# Patient Record
Sex: Female | Born: 1935 | Race: White | Hispanic: No | Marital: Married | Smoking: Never smoker
Health system: Southern US, Community
[De-identification: ages and names within clinical notes are randomized; demographics above are authoritative.]

## PROBLEM LIST (undated history)

## (undated) ENCOUNTER — Ambulatory Visit: Source: Home / Self Care

## (undated) DIAGNOSIS — Z5189 Encounter for other specified aftercare: Secondary | ICD-10-CM

## (undated) DIAGNOSIS — K5730 Diverticulosis of large intestine without perforation or abscess without bleeding: Secondary | ICD-10-CM

## (undated) DIAGNOSIS — D735 Infarction of spleen: Secondary | ICD-10-CM

## (undated) DIAGNOSIS — M1990 Unspecified osteoarthritis, unspecified site: Secondary | ICD-10-CM

## (undated) DIAGNOSIS — R51 Headache: Secondary | ICD-10-CM

## (undated) DIAGNOSIS — K219 Gastro-esophageal reflux disease without esophagitis: Secondary | ICD-10-CM

## (undated) DIAGNOSIS — B3781 Candidal esophagitis: Secondary | ICD-10-CM

## (undated) DIAGNOSIS — K5792 Diverticulitis of intestine, part unspecified, without perforation or abscess without bleeding: Secondary | ICD-10-CM

## (undated) DIAGNOSIS — F411 Generalized anxiety disorder: Secondary | ICD-10-CM

## (undated) DIAGNOSIS — N200 Calculus of kidney: Secondary | ICD-10-CM

## (undated) DIAGNOSIS — H02403 Unspecified ptosis of bilateral eyelids: Secondary | ICD-10-CM

## (undated) DIAGNOSIS — N280 Ischemia and infarction of kidney: Secondary | ICD-10-CM

## (undated) DIAGNOSIS — J439 Emphysema, unspecified: Secondary | ICD-10-CM

## (undated) DIAGNOSIS — I499 Cardiac arrhythmia, unspecified: Secondary | ICD-10-CM

## (undated) DIAGNOSIS — A0472 Enterocolitis due to Clostridium difficile, not specified as recurrent: Secondary | ICD-10-CM

## (undated) DIAGNOSIS — F419 Anxiety disorder, unspecified: Secondary | ICD-10-CM

## (undated) DIAGNOSIS — I10 Essential (primary) hypertension: Secondary | ICD-10-CM

## (undated) DIAGNOSIS — N189 Chronic kidney disease, unspecified: Secondary | ICD-10-CM

## (undated) DIAGNOSIS — Z800 Family history of malignant neoplasm of digestive organs: Secondary | ICD-10-CM

## (undated) DIAGNOSIS — M797 Fibromyalgia: Secondary | ICD-10-CM

## (undated) DIAGNOSIS — E785 Hyperlipidemia, unspecified: Secondary | ICD-10-CM

## (undated) DIAGNOSIS — S02402A Zygomatic fracture, unspecified, initial encounter for closed fracture: Secondary | ICD-10-CM

## (undated) HISTORY — DX: Enterocolitis due to Clostridium difficile, not specified as recurrent: A04.72

## (undated) HISTORY — PX: BACK SURGERY: SHX140

## (undated) HISTORY — DX: Generalized anxiety disorder: F41.1

## (undated) HISTORY — DX: Family history of malignant neoplasm of digestive organs: Z80.0

## (undated) HISTORY — PX: FACIAL FRACTURE SURGERY: SHX1570

## (undated) HISTORY — PX: CERVICAL DISC SURGERY: SHX588

## (undated) HISTORY — PX: SHOULDER ARTHROSCOPY W/ ROTATOR CUFF REPAIR: SHX2400

## (undated) HISTORY — PX: BREAST EXCISIONAL BIOPSY: SUR124

## (undated) HISTORY — DX: Zygomatic fracture, unspecified side, initial encounter for closed fracture: S02.402A

## (undated) HISTORY — DX: Emphysema, unspecified: J43.9

## (undated) HISTORY — DX: Candidal esophagitis: B37.81

## (undated) HISTORY — PX: HAND SURGERY: SHX662

## (undated) HISTORY — DX: Diverticulosis of large intestine without perforation or abscess without bleeding: K57.30

## (undated) HISTORY — PX: ABDOMINAL HYSTERECTOMY: SHX81

## (undated) HISTORY — DX: Diverticulitis of intestine, part unspecified, without perforation or abscess without bleeding: K57.92

## (undated) HISTORY — PX: COLONOSCOPY: SHX174

---

## 2000-06-17 ENCOUNTER — Emergency Department (HOSPITAL_COMMUNITY): Admission: EM | Admit: 2000-06-17 | Discharge: 2000-06-17 | Payer: Self-pay | Admitting: Emergency Medicine

## 2000-06-17 ENCOUNTER — Encounter: Payer: Self-pay | Admitting: Emergency Medicine

## 2000-07-14 ENCOUNTER — Ambulatory Visit (HOSPITAL_COMMUNITY): Admission: RE | Admit: 2000-07-14 | Discharge: 2000-07-14 | Payer: Self-pay | Admitting: Orthopaedic Surgery

## 2000-07-24 ENCOUNTER — Encounter: Admission: RE | Admit: 2000-07-24 | Discharge: 2000-08-29 | Payer: Self-pay | Admitting: Orthopaedic Surgery

## 2001-11-06 ENCOUNTER — Emergency Department (HOSPITAL_COMMUNITY): Admission: EM | Admit: 2001-11-06 | Discharge: 2001-11-06 | Payer: Self-pay | Admitting: Emergency Medicine

## 2005-05-15 ENCOUNTER — Ambulatory Visit: Payer: Self-pay

## 2005-06-25 DIAGNOSIS — K5730 Diverticulosis of large intestine without perforation or abscess without bleeding: Secondary | ICD-10-CM

## 2005-06-25 HISTORY — DX: Diverticulosis of large intestine without perforation or abscess without bleeding: K57.30

## 2005-07-04 ENCOUNTER — Ambulatory Visit: Payer: Self-pay | Admitting: Gastroenterology

## 2005-07-16 ENCOUNTER — Ambulatory Visit: Payer: Self-pay | Admitting: Gastroenterology

## 2005-07-25 ENCOUNTER — Emergency Department (HOSPITAL_COMMUNITY): Admission: EM | Admit: 2005-07-25 | Discharge: 2005-07-25 | Payer: Self-pay | Admitting: Emergency Medicine

## 2005-07-31 ENCOUNTER — Encounter: Admission: RE | Admit: 2005-07-31 | Discharge: 2005-07-31 | Payer: Self-pay | Admitting: Internal Medicine

## 2005-12-21 ENCOUNTER — Ambulatory Visit (HOSPITAL_BASED_OUTPATIENT_CLINIC_OR_DEPARTMENT_OTHER): Admission: RE | Admit: 2005-12-21 | Discharge: 2005-12-21 | Payer: Self-pay | Admitting: General Surgery

## 2006-01-29 ENCOUNTER — Encounter: Admission: RE | Admit: 2006-01-29 | Discharge: 2006-01-29 | Payer: Self-pay | Admitting: Internal Medicine

## 2010-12-12 ENCOUNTER — Other Ambulatory Visit: Payer: Self-pay | Admitting: Orthopedic Surgery

## 2010-12-12 DIAGNOSIS — M25561 Pain in right knee: Secondary | ICD-10-CM

## 2010-12-18 ENCOUNTER — Ambulatory Visit
Admission: RE | Admit: 2010-12-18 | Discharge: 2010-12-18 | Disposition: A | Discharge status: Home / Self Care | Payer: Medicare Other | Source: Ambulatory Visit | Attending: Orthopedic Surgery | Admitting: Orthopedic Surgery

## 2010-12-18 DIAGNOSIS — M25561 Pain in right knee: Secondary | ICD-10-CM

## 2011-06-26 DIAGNOSIS — B3781 Candidal esophagitis: Secondary | ICD-10-CM

## 2011-06-26 HISTORY — DX: Candidal esophagitis: B37.81

## 2011-07-04 ENCOUNTER — Emergency Department (HOSPITAL_COMMUNITY): Payer: Medicare Other

## 2011-07-04 ENCOUNTER — Encounter (HOSPITAL_COMMUNITY): Payer: Self-pay | Admitting: Emergency Medicine

## 2011-07-04 ENCOUNTER — Emergency Department (HOSPITAL_COMMUNITY)
Admission: EM | Admit: 2011-07-04 | Discharge: 2011-07-05 | Disposition: A | Discharge status: Home / Self Care | Payer: Medicare Other | Attending: Emergency Medicine | Admitting: Emergency Medicine

## 2011-07-04 DIAGNOSIS — S0230XA Fracture of orbital floor, unspecified side, initial encounter for closed fracture: Secondary | ICD-10-CM

## 2011-07-04 DIAGNOSIS — M545 Low back pain, unspecified: Secondary | ICD-10-CM | POA: Insufficient documentation

## 2011-07-04 DIAGNOSIS — IMO0002 Reserved for concepts with insufficient information to code with codable children: Secondary | ICD-10-CM

## 2011-07-04 DIAGNOSIS — S61209A Unspecified open wound of unspecified finger without damage to nail, initial encounter: Secondary | ICD-10-CM | POA: Insufficient documentation

## 2011-07-04 DIAGNOSIS — R51 Headache: Secondary | ICD-10-CM | POA: Insufficient documentation

## 2011-07-04 DIAGNOSIS — S0003XA Contusion of scalp, initial encounter: Secondary | ICD-10-CM | POA: Insufficient documentation

## 2011-07-04 DIAGNOSIS — M79609 Pain in unspecified limb: Secondary | ICD-10-CM | POA: Insufficient documentation

## 2011-07-04 DIAGNOSIS — W06XXXA Fall from bed, initial encounter: Secondary | ICD-10-CM | POA: Insufficient documentation

## 2011-07-04 DIAGNOSIS — E785 Hyperlipidemia, unspecified: Secondary | ICD-10-CM | POA: Insufficient documentation

## 2011-07-04 DIAGNOSIS — I10 Essential (primary) hypertension: Secondary | ICD-10-CM | POA: Insufficient documentation

## 2011-07-04 DIAGNOSIS — M25569 Pain in unspecified knee: Secondary | ICD-10-CM | POA: Insufficient documentation

## 2011-07-04 DIAGNOSIS — Z79899 Other long term (current) drug therapy: Secondary | ICD-10-CM | POA: Insufficient documentation

## 2011-07-04 HISTORY — DX: Anxiety disorder, unspecified: F41.9

## 2011-07-04 HISTORY — DX: Hyperlipidemia, unspecified: E78.5

## 2011-07-04 HISTORY — DX: Essential (primary) hypertension: I10

## 2011-07-04 HISTORY — DX: Fibromyalgia: M79.7

## 2011-07-04 MED ORDER — OXYCODONE-ACETAMINOPHEN 5-325 MG PO TABS
1.0000 | ORAL_TABLET | Freq: Once | ORAL | Status: AC
  Administered 2011-07-04: 1 via ORAL
  Filled 2011-07-04: qty 1

## 2011-07-04 MED ORDER — ONDANSETRON 4 MG PO TBDP
8.0000 mg | ORAL_TABLET | Freq: Once | ORAL | Status: AC
  Administered 2011-07-04: 8 mg via ORAL
  Filled 2011-07-04: qty 2

## 2011-07-04 MED ORDER — TETANUS-DIPHTH-ACELL PERTUSSIS 5-2.5-18.5 LF-MCG/0.5 IM SUSP
0.5000 mL | Freq: Once | INTRAMUSCULAR | Status: DC
  Filled 2011-07-04: qty 0.5

## 2011-07-04 NOTE — ED Notes (Signed)
Presents s/p fall  Right eye with brusing  Laceration to right index finger bleeding controlled and abrasion to right knee.  ALso c/o lumbar back pain .

## 2011-07-04 NOTE — ED Notes (Signed)
Pt st's she was standing on the bed trying to change a light bulb and she lost her balance and fell hitting the bedside table.  Pt has swelling and bruising to right eye, lac to right index finger and pain to right knee.  Ice pack applied to head.  Pt denies LOC.

## 2011-07-04 NOTE — ED Provider Notes (Addendum)
History     CSN: [id number]  Arrival date & time 07/04/11  4098   First MD Initiated Contact with Patient 07/04/11 2138      Chief Complaint  Patient presents with  . Fall     Patient is a 78 y.o. female presenting with fall. The history is provided by the patient.  Fall The accident occurred 3 to 5 hours ago. Incident: while changing a light bulb. The pain is present in the head. The pain is moderate. She was ambulatory at the scene. Associated symptoms include headaches. Pertinent negatives include no visual change and no loss of consciousness. Exacerbated by: palpation.  Pt reports she was standing on a bed, changing a light bulb when she lost balance and fell hitting table with her head/face No epistaxis No visual change No cp/sob No neck pain Reports increased low back pain from her baseline No focal weakness She has pain in right index finger and her right knee   Past Medical History  Diagnosis Date  . Hypertension   . Hyperlipemia   . Fibromyalgia   . Anxiety     Past Surgical History  Procedure Date  . Abdominal hysterectomy   . Hand surgery   . Back surgery     No family history on file.  History  Substance Use Topics  . Smoking status: Never Smoker   . Smokeless tobacco: Not on file  . Alcohol Use: No    OB History    Grav Para Term Preterm Abortions TAB SAB Ect Mult Living                  Review of Systems  Neurological: Positive for headaches. Negative for loss of consciousness.  All other systems reviewed and are negative.    Allergies  Aspirin; Codeine; and Latex  Home Medications   Current Outpatient Rx  Name Route Sig Dispense Refill  . ALPRAZOLAM 1 MG PO TABS Oral Take 0.5-1 mg by mouth 3 (three) times daily as needed. For anxiety    . ATENOLOL 100 MG PO TABS Oral Take 100 mg by mouth every evening.    Marland Kitchen LOSARTAN POTASSIUM 100 MG PO TABS Oral Take 100 mg by mouth every evening.    Marland Kitchen OMEPRAZOLE MAGNESIUM 20 MG PO TBEC Oral Take  20 mg by mouth daily.    Marland Kitchen PRAVASTATIN SODIUM 40 MG PO TABS Oral Take 40 mg by mouth daily.      BP 133/70  Pulse 71  Temp(Src) 98.4 F (36.9 C) (Oral)  Resp 20  SpO2 97%  Physical Exam CONSTITUTIONAL: Well developed/well nourished HEAD AND FACE: right periorbital hematoma EYES: PERRL, no obvious foreign body to OD , appears to have some difficulty with downward gaze, no proptosis ENMT: Mucous membranes moist, no nasal deformity, no septal hematoma, no malocclusion, no trismus, no dental fx noted.  Bruising noted inside on buccal mucosa but no large laceration NECK: supple no meningeal signs SPINE:C-spine nontender, NEXUS criteria met, no thoracic tenderness, +lumbar tenderness CV: S1/S2 noted, no murmurs/rubs/gallops noted LUNGS: Lungs are clear to auscultation bilaterally, no apparent distress ABDOMEN: soft, nontender, no rebound or guarding GU:no cva tenderness NEURO: Pt is awake/alert, moves all extremitiesx4, GCS 15 EXTREMITIES: pulses normal, full ROM.  Tenderness to palpation of right patella.  Laceration noted to lateral/distal aspect of right index finger, no bone/tendon exposed, bleeding controlled.  Tenderness noted.  All other extremities/joints palpated/ranged and nontender No snuffbox tenderness noted to either wrist No tenderness with ROM of  either hip SKIN: warm, color normal PSYCH: no abnormalities of mood noted  ED Course  Procedures   LACERATION REPAIR Performed by: Joya Gaskins Consent: Verbal consent obtained. Risks and benefits: risks, benefits and alternatives were discussed Patient identity confirmed: provided demographic data Time out performed prior to procedure Prepped and Draped in normal sterile fashion Wound explored  Laceration Location: right index finger  Laceration Length: 1 cm  No Foreign Bodies seen or palpated  Anesthesia: none  Irrigation method: tap water - standard amount  Skin closure: simple   Technique: tissue  adhesive  Patient tolerance: Patient tolerated the procedure well with no immediate complications.    Labs Reviewed - No data to display Dg Lumbar Spine Complete  07/04/2011  *RADIOLOGY REPORT*  Clinical Data: Fall.  Back pain.  History of lumbar spine surgery.  LUMBAR SPINE - COMPLETE 4+ VIEW  Comparison: None.  Findings: Unchanged alignment lumbar spine.  Spinous process cerclage wires are present extending from L3-S1.  Ankylosis of L4- L5.  Vertebral body height is preserved.  Adjacent segment degenerative disease.  Posterior fusion is present.  Osteopenia.  IMPRESSION: No acute osseous abnormality.  Postoperative changes of lumbar spinal fusion extending from L3-S1.  Original Report Authenticated By: Andreas Newport, M.D.   Dg Knee Complete 4 Views Right  07/04/2011  *RADIOLOGY REPORT*  Clinical Data: Fall.  Head injury.  Knee pain.  RIGHT KNEE - COMPLETE 4+ VIEW  Comparison: None.  Findings: Anatomic alignment of the knee.  There is no displaced fracture identified.  There is some cortical irregularity of the superior pole of the patella however this would be an unusual location for fracture and there is minimal prepatellar soft tissue swelling which is typically associated with patellar fracture. This may represent enthesopathy associated with the quadriceps insertion.  Clinically correlate for tenderness in this region. There is no knee effusion.  Medial and lateral joint spaces are preserved.  IMPRESSION: No definite acute osseous abnormality.  Cortical irregularity of the superior patellar pole probably represents enthesopathy. Clinically correlate for tenderness.  Original Report Authenticated By: Andreas Newport, M.D.   Dg Finger Index Right  07/04/2011  *RADIOLOGY REPORT*  Clinical Data: Fall.  Right index finger pain.  RIGHT INDEX FINGER 2+V  Comparison: None.  Findings: Right DIP joint severe osteoarthritis.  Osteoarthritis of the PIP joint of the index finger is also present.  The diffusely  osteoarthritis of the hand.  The soft tissue irregularity is present over the index finger and there is ulnar deviation of the index finger.  No displaced fracture is identified.  IMPRESSION: No displaced fracture.  Osteoarthritis and ulnar deviation of the right index finger.  Original Report Authenticated By: Andreas Newport, M.D.   12:02 AM No double vision noted but appears to have some difficulty with downward gaze on right No hyphema, pupils equal/reactive Will call into ENT for facial injury Pt is ambulatory   12:06 AM D/w dr Lazarus Salines, discussed limiation in downward gaze and also ct findings Will see early next week Pain control, abx, analgesia Needs eye followup as well   Pt does not have her glasses Vision in both eyes ranged from 20/100-20/200 MDM  Nursing notes reviewed and considered in documentation xrays reviewed and considered         Joya Gaskins, MD 07/05/11 5621  Joya Gaskins, MD 07/05/11 [phone]

## 2011-07-05 MED ORDER — OXYCODONE-ACETAMINOPHEN 5-325 MG PO TABS: 1.0000 | ORAL_TABLET | ORAL | Status: DC | PRN

## 2011-07-05 MED ORDER — CEPHALEXIN 500 MG PO CAPS: 500.0000 mg | ORAL_CAPSULE | Freq: Four times a day (QID) | ORAL | Status: DC

## 2011-07-10 ENCOUNTER — Encounter (HOSPITAL_COMMUNITY): Payer: Self-pay | Admitting: Respiratory Therapy

## 2011-07-11 ENCOUNTER — Encounter (HOSPITAL_COMMUNITY): Payer: Self-pay

## 2011-07-11 ENCOUNTER — Ambulatory Visit (HOSPITAL_COMMUNITY)
Admission: RE | Admit: 2011-07-11 | Discharge: 2011-07-11 | Disposition: A | Discharge status: Home / Self Care | Payer: Medicare Other | Source: Ambulatory Visit | Attending: Anesthesiology | Admitting: Anesthesiology

## 2011-07-11 ENCOUNTER — Encounter (HOSPITAL_COMMUNITY)
Admission: RE | Admit: 2011-07-11 | Discharge: 2011-07-11 | Disposition: A | Discharge status: Home / Self Care | Payer: Medicare Other | Source: Ambulatory Visit | Attending: Otolaryngology | Admitting: Otolaryngology

## 2011-07-11 DIAGNOSIS — I10 Essential (primary) hypertension: Secondary | ICD-10-CM | POA: Insufficient documentation

## 2011-07-11 DIAGNOSIS — I517 Cardiomegaly: Secondary | ICD-10-CM | POA: Insufficient documentation

## 2011-07-11 DIAGNOSIS — Z01812 Encounter for preprocedural laboratory examination: Secondary | ICD-10-CM | POA: Insufficient documentation

## 2011-07-11 DIAGNOSIS — Z01818 Encounter for other preprocedural examination: Secondary | ICD-10-CM | POA: Insufficient documentation

## 2011-07-11 HISTORY — DX: Cardiac arrhythmia, unspecified: I49.9

## 2011-07-11 HISTORY — DX: Chronic kidney disease, unspecified: N18.9

## 2011-07-11 HISTORY — DX: Unspecified osteoarthritis, unspecified site: M19.90

## 2011-07-11 HISTORY — DX: Encounter for other specified aftercare: Z51.89

## 2011-07-11 HISTORY — DX: Headache: R51

## 2011-07-11 HISTORY — DX: Gastro-esophageal reflux disease without esophagitis: K21.9

## 2011-07-11 LAB — BASIC METABOLIC PANEL
BUN: 15 mg/dL (ref 6–23)
CO2: 32 mEq/L (ref 19–32)
Calcium: 9.4 mg/dL (ref 8.4–10.5)
Creatinine, Ser: 0.71 mg/dL (ref 0.50–1.10)
Glucose, Bld: 98 mg/dL (ref 70–99)

## 2011-07-11 LAB — CBC
MCH: 30.4 pg (ref 26.0–34.0)
MCHC: 34.1 g/dL (ref 30.0–36.0)
MCV: 89.1 fL (ref 78.0–100.0)
Platelets: 253 10*3/uL (ref 150–400)
RDW: 12.5 % (ref 11.5–15.5)

## 2011-07-11 NOTE — Progress Notes (Signed)
EKG REQ BY TARRA FROM DR [facility]

## 2011-07-11 NOTE — Pre-Procedure Instructions (Addendum)
20 Debra White  07/11/2011   Your procedure is scheduled on:  07/13/11  Report to [facility] at 630 AM.  Call this number if you have problems the morning of surgery: [phone]   Remember:   Do not eat food:After Midnight.  May have clear liquids: up to 4 Hours before arrival.  Clear liquids include soda, tea, black coffee, apple or grape juice, broth.  Take these medicines the morning of surgery with A SIP OF WATER:XANAX, ATENOLOL,KEFLEX, HYCOSAMINE, PRILOSEC,  TRAMADOL               Stop FLAXSEED, MULTI VIT,  FISH OIL NOW   Do not wear jewelry, make-up or nail polish.  Do not wear lotions, powders, or perfumes. You may wear deodorant.  Do not shave 48 hours prior to surgery.  Do not bring valuables to the hospital.  Contacts, dentures or bridgework may not be worn into surgery.  Leave suitcase in the car. After surgery it may be brought to your room.  For patients admitted to the hospital, checkout time is 11:00 AM the day of discharge.   Patients discharged the day of surgery will not be allowed to drive home.  Name and phone number of your driver:   Special Instructions: CHG Shower Use Special Wash: 1/2 bottle night before surgery and 1/2 bottle morning of surgery.   Please read over the following fact sheets that you were given: Pain Booklet, MRSA Information and Surgical Site Infection Prevention

## 2011-07-12 ENCOUNTER — Other Ambulatory Visit: Payer: Self-pay | Admitting: Otolaryngology

## 2011-07-12 NOTE — H&P (Signed)
White,  Debra 77 y.o., female [id number]     Chief Complaint: Depressed RIGHT zygomatic fracture, including orbital floor fracture  HPI: 77-year-old white female was standing full upright on the bed changing a light bulb, and fell off, striking her face against a bedside table.  She did not lose consciousness.  She was seen in the emergency room where a CT scan demonstrated a depressed fracture of the right eye, with disruption of the orbital floor.  I did review these films.  Subsequently, she developed additional aches and pains including her RIGHT shoulder and RIGHT knee.  She has some pre-existing arthralgias and may even carry the diagnosis of fibromyalgia.  From the emergency room, she was given Tylenol for pain, Keflex antibiosis, had ice and elevation recommended, no nose blowing, and last week saw Dr. Bevis, ophthalmologist.  She is here for further evaluation of the injury.  She feels like the vision in the right eye is slightly blurry.  Occlusion feels okay, but the jaw is painful with range of motion.  Mild discomfort in the right eye with range of motion.  No obvious diplopia.  She does complain of numbness in the RIGHT mid face including nose and upper lip.  PMH: Past Medical History  Diagnosis Date  . Hypertension   . Hyperlipemia   . Fibromyalgia   . Anxiety   . Dysrhythmia     RX  . Blood transfusion   . Chronic kidney disease     STONES  . GERD (gastroesophageal reflux disease)   . Headache   . Arthritis     Surg Hx: Past Surgical History  Procedure Date  . Abdominal hysterectomy   . Hand surgery     BIL   . Shoulder arthroscopy w/ rotator cuff repair     LFT  . Cervical disc surgery   . Back surgery     X2    FHx:  No family history on file. SocHx:  reports that she has never smoked. She does not have any smokeless tobacco history on file. She reports that she does not drink alcohol or use illicit drugs.  ALLERGIES:  Allergies  Allergen Reactions  .  Latex Hives    TONGUE HAD BLISTERS WHEN HAD DENTAL SURGERY  . Aspirin     Gi upset  . Codeine Itching    Medications Prior to Admission  Medication Sig Dispense Refill  . ALPRAZolam (XANAX) 1 MG tablet Take 0.5-1 mg by mouth 3 (three) times daily as needed. For anxiety      . atenolol (TENORMIN) 100 MG tablet Take 100 mg by mouth every morning.       . Calcium Carbonate-Vitamin D (CALCIUM 600 + D PO) Take 1 tablet by mouth daily.      . cephALEXin (KEFLEX) 500 MG capsule Take 500 mg by mouth 4 (four) times daily.      . cholecalciferol (VITAMIN D) 1000 UNITS tablet Take 2,000 Units by mouth daily.      . fish oil-omega-3 fatty acids 1000 MG capsule Take 1 g by mouth 3 (three) times daily.      . Flaxseed, Linseed, (FLAX SEED OIL) 1300 MG CAPS Take 1 capsule by mouth 2 (two) times daily.      . hyoscyamine (LEVSIN SL) 0.125 MG SL tablet Place 0.125 mg under the tongue every 4 (four) hours as needed.      . losartan (COZAAR) 100 MG tablet Take 100 mg by mouth every evening.      .   Magnesium 250 MG TABS Take 250 mg by mouth 3 (three) times daily.      . Multiple Vitamins-Minerals (MULTIVITAMINS THER. W/MINERALS) TABS Take 1 tablet by mouth daily.      . omeprazole (PRILOSEC OTC) 20 MG tablet Take 20 mg by mouth daily.      . OVER THE COUNTER MEDICATION Take 1 capsule by mouth daily. Pt. Takes a over the counter stool softner      . oxyCODONE-acetaminophen (PERCOCET) 5-325 MG per tablet Take 1 tablet by mouth every 4 (four) hours as needed. For pain      . pravastatin (PRAVACHOL) 40 MG tablet Take 40 mg by mouth daily.      . Probiotic Product (PHILLIPS COLON HEALTH PO) Take 1 tablet by mouth daily.      . traMADol (ULTRAM) 50 MG tablet Take 50 mg by mouth every 6 (six) hours as needed. For pain       No current facility-administered medications on file as of 07/12/2011.    Results for orders placed during the hospital encounter of 07/11/11 (from the past 48 hour(s))  SURGICAL PCR SCREEN      Status: Normal   Collection Time   07/11/11  2:31 PM      Component Value Range Comment   MRSA, PCR NEGATIVE  NEGATIVE     Staphylococcus aureus NEGATIVE  NEGATIVE    BASIC METABOLIC PANEL     Status: Abnormal   Collection Time   07/11/11  3:00 PM      Component Value Range Comment   Sodium 132 (*) 135 - 145 (mEq/L)    Potassium 4.0  3.5 - 5.1 (mEq/L)    Chloride 92 (*) 96 - 112 (mEq/L)    CO2 32  19 - 32 (mEq/L)    Glucose, Bld 98  70 - 99 (mg/dL)    BUN 15  6 - 23 (mg/dL)    Creatinine, Ser 0.71  0.50 - 1.10 (mg/dL)    Calcium 9.4  8.4 - 10.5 (mg/dL)    GFR calc non Af Amer 82 (*) >90 (mL/min)    GFR calc Af Amer >90  >90 (mL/min)   CBC     Status: Abnormal   Collection Time   07/11/11  3:00 PM      Component Value Range Comment   WBC 11.6 (*) 4.0 - 10.5 (K/uL)    RBC 3.95  3.87 - 5.11 (MIL/uL)    Hemoglobin 12.0  12.0 - 15.0 (g/dL)    HCT 35.2 (*) 36.0 - 46.0 (%)    MCV 89.1  78.0 - 100.0 (fL)    MCH 30.4  26.0 - 34.0 (pg)    MCHC 34.1  30.0 - 36.0 (g/dL)    RDW 12.5  11.5 - 15.5 (%)    Platelets 253  150 - 400 (K/uL)    Dg Chest 2 View  07/11/2011  *RADIOLOGY REPORT*  Clinical Data: Preoperative evaluation.  History of sternal fracture.  History of hypertension.  History of acute bronchitis.  CHEST - 2 VIEW  Comparison: None.  Findings: There is slight enlargement cardiac silhouette.  No pulmonary edema, pneumonia, or pleural effusion is seen.  Slight tortuosity of descending thoracic aorta is seen.  History given of sternal fracture.  Detail of sternum on lateral image using chest technique is not adequate to evaluate the sternum.  There is osteopenic appearance of bones.  IMPRESSION: Slight enlargement cardiac silhouette.  No pulmonary edema, pneumonia, or pleural effusion.  Original   Report Authenticated By: DAVID CALL, M.D.    ROS:non contrib except as in HPI.  There were no vitals taken for this visit.  PHYSICAL EXAM:  She is thin and basically healthy although appears  frustrated and tired.  She has flattening of the RIGHT malar eminence.  She has periorbital ecchymosis on the RIGHT side.  She has some halitosis consistent with old blood.  Mental status seems appropriate. She wears hearing aids on both sides and is somewhat hard of hearing in conversational speech without them.  As best I could tell, she has full range of motion with conjugate gaze.  She describes subjective blurriness on the RIGHT.  Ear canals are clear with normal aerated drums.  Internal nose is clear.  Oral cavity is moist with a partial upper and lower plates.  No true trismus or malocclusion.  Oropharynx clear.  Neck unremarkable.   Palpation of the bony facial skeleton reveals depression of the zygomatic arch and malar eminence, and a 6 mm step off of the RIGHT infraorbital rim.   Studies Reviewed:Maxillofacial CT scan with 3D reconstructions.    Assessment/Plan Displaced/depressed RIGHT zygoma fracture with some disruption of the orbital floor.  She is having numbness consistent with an infraorbital nerve injury.  She has pain with range of motion of the jaw consistent with impingement of the zygomatic arch on the temporalis muscle.  Some blurred vision on the RIGHT for which she is under the care of Dr. Bevis, ophthalmologist.  In fact, she is seeing him again tomorrow.  I recommend ORIF RIGHT zygoma with orbital floor exploration and with a Frost stitch temporary tarsorrhaphy.  I would like to get around to doing this later this week or early next week.  I spoke with Dr. McKeown, her family physician,  who felt like she should be okay for general anesthesia.   I discussed the surgery in detail including risks and complications.  Questions were answered and informed consent was obtained.  We will do this at [facility] main hospital and observe her 23 hours extended recovery.  Postoperative prescriptions for Vicodin and Keflex rested and given.  I will see her 10 days after surgery here in the office  for suture removal.  I discussed advancement of diet and activity, and further proscription against nose blowing for 2 weeks after the surgery.  Lundon Verdejo T 07/12/2011, 12:19 PM      

## 2011-07-13 ENCOUNTER — Ambulatory Visit (HOSPITAL_COMMUNITY)
Admission: RE | Admit: 2011-07-13 | Discharge: 2011-07-14 | Disposition: A | Discharge status: Home / Self Care | Payer: Medicare Other | Source: Ambulatory Visit | Attending: Otolaryngology | Admitting: Otolaryngology

## 2011-07-13 ENCOUNTER — Encounter (HOSPITAL_COMMUNITY): Payer: Self-pay | Admitting: *Deleted

## 2011-07-13 ENCOUNTER — Encounter (HOSPITAL_COMMUNITY)
Admission: RE | Disposition: A | Discharge status: Home / Self Care | Payer: Self-pay | Source: Ambulatory Visit | Attending: Otolaryngology

## 2011-07-13 ENCOUNTER — Ambulatory Visit (HOSPITAL_COMMUNITY): Payer: Medicare Other | Admitting: *Deleted

## 2011-07-13 DIAGNOSIS — M129 Arthropathy, unspecified: Secondary | ICD-10-CM | POA: Insufficient documentation

## 2011-07-13 DIAGNOSIS — F411 Generalized anxiety disorder: Secondary | ICD-10-CM | POA: Insufficient documentation

## 2011-07-13 DIAGNOSIS — S02400A Malar fracture unspecified, initial encounter for closed fracture: Secondary | ICD-10-CM | POA: Insufficient documentation

## 2011-07-13 DIAGNOSIS — E785 Hyperlipidemia, unspecified: Secondary | ICD-10-CM | POA: Insufficient documentation

## 2011-07-13 DIAGNOSIS — M25519 Pain in unspecified shoulder: Secondary | ICD-10-CM | POA: Insufficient documentation

## 2011-07-13 DIAGNOSIS — M25569 Pain in unspecified knee: Secondary | ICD-10-CM | POA: Insufficient documentation

## 2011-07-13 DIAGNOSIS — S02402A Zygomatic fracture, unspecified, initial encounter for closed fracture: Secondary | ICD-10-CM

## 2011-07-13 DIAGNOSIS — R51 Headache: Secondary | ICD-10-CM | POA: Insufficient documentation

## 2011-07-13 DIAGNOSIS — Y998 Other external cause status: Secondary | ICD-10-CM | POA: Insufficient documentation

## 2011-07-13 DIAGNOSIS — Y93E9 Activity, other interior property and clothing maintenance: Secondary | ICD-10-CM | POA: Insufficient documentation

## 2011-07-13 DIAGNOSIS — S0230XA Fracture of orbital floor, unspecified side, initial encounter for closed fracture: Secondary | ICD-10-CM | POA: Insufficient documentation

## 2011-07-13 DIAGNOSIS — F430 Acute stress reaction: Secondary | ICD-10-CM | POA: Insufficient documentation

## 2011-07-13 DIAGNOSIS — K219 Gastro-esophageal reflux disease without esophagitis: Secondary | ICD-10-CM | POA: Insufficient documentation

## 2011-07-13 DIAGNOSIS — I499 Cardiac arrhythmia, unspecified: Secondary | ICD-10-CM | POA: Insufficient documentation

## 2011-07-13 DIAGNOSIS — W06XXXA Fall from bed, initial encounter: Secondary | ICD-10-CM | POA: Insufficient documentation

## 2011-07-13 DIAGNOSIS — S02401A Maxillary fracture, unspecified, initial encounter for closed fracture: Secondary | ICD-10-CM | POA: Insufficient documentation

## 2011-07-13 DIAGNOSIS — I10 Essential (primary) hypertension: Secondary | ICD-10-CM | POA: Insufficient documentation

## 2011-07-13 DIAGNOSIS — IMO0001 Reserved for inherently not codable concepts without codable children: Secondary | ICD-10-CM | POA: Insufficient documentation

## 2011-07-13 DIAGNOSIS — Y92009 Unspecified place in unspecified non-institutional (private) residence as the place of occurrence of the external cause: Secondary | ICD-10-CM | POA: Insufficient documentation

## 2011-07-13 HISTORY — PX: ORIF TRIPOD FRACTURE: SHX5211

## 2011-07-13 HISTORY — DX: Zygomatic fracture, unspecified side, initial encounter for closed fracture: S02.402A

## 2011-07-13 SURGERY — OPEN REDUCTION INTERNAL FIXATION (ORIF) TRIPOD FRACTURE
Anesthesia: General | Site: Face | Laterality: Right | Wound class: Clean Contaminated

## 2011-07-13 MED ORDER — CEFAZOLIN SODIUM 1-5 GM-% IV SOLN
1.0000 g | INTRAVENOUS | Status: AC
  Administered 2011-07-13: 1 g via INTRAVENOUS
  Filled 2011-07-13: qty 50

## 2011-07-13 MED ORDER — ONDANSETRON HCL 4 MG PO TABS: 4.0000 mg | ORAL_TABLET | ORAL | Status: DC | PRN

## 2011-07-13 MED ORDER — ATENOLOL 100 MG PO TABS
100.0000 mg | ORAL_TABLET | ORAL | Status: DC
  Administered 2011-07-14: 100 mg via ORAL
  Filled 2011-07-13 (×3): qty 1

## 2011-07-13 MED ORDER — CEPHALEXIN 250 MG PO CAPS: 250.0000 mg | ORAL_CAPSULE | Freq: Four times a day (QID) | ORAL | Status: DC

## 2011-07-13 MED ORDER — CEPHALEXIN 500 MG PO CAPS
500.0000 mg | ORAL_CAPSULE | Freq: Four times a day (QID) | ORAL | Status: DC
  Administered 2011-07-13: 500 mg via ORAL
  Filled 2011-07-13 (×3): qty 1

## 2011-07-13 MED ORDER — ALPRAZOLAM 0.5 MG PO TABS
0.5000 mg | ORAL_TABLET | Freq: Three times a day (TID) | ORAL | Status: DC | PRN
  Administered 2011-07-13: 0.5 mg via ORAL
  Administered 2011-07-14: 1 mg via ORAL
  Filled 2011-07-13: qty 1
  Filled 2011-07-13: qty 2

## 2011-07-13 MED ORDER — ONDANSETRON HCL 4 MG/2ML IJ SOLN
INTRAMUSCULAR | Status: DC | PRN
  Administered 2011-07-13: 4 mg via INTRAVENOUS

## 2011-07-13 MED ORDER — CEFAZOLIN SODIUM 1-5 GM-% IV SOLN
INTRAVENOUS | Status: AC
  Filled 2011-07-13: qty 50

## 2011-07-13 MED ORDER — DROPERIDOL 2.5 MG/ML IJ SOLN
0.6250 mg | INTRAMUSCULAR | Status: DC | PRN
  Filled 2011-07-13: qty 2

## 2011-07-13 MED ORDER — ALUM & MAG HYDROXIDE-SIMETH 200-200-20 MG/5ML PO SUSP
30.0000 mL | Freq: Four times a day (QID) | ORAL | Status: DC | PRN
  Administered 2011-07-14: 30 mL via ORAL
  Filled 2011-07-13 (×2): qty 30

## 2011-07-13 MED ORDER — EPHEDRINE SULFATE 50 MG/ML IJ SOLN
INTRAMUSCULAR | Status: DC | PRN
  Administered 2011-07-13: 10 mg via INTRAVENOUS
  Administered 2011-07-13: 5 mg via INTRAVENOUS
  Administered 2011-07-13: 10 mg via INTRAVENOUS
  Administered 2011-07-13 (×2): 5 mg via INTRAVENOUS

## 2011-07-13 MED ORDER — HYOSCYAMINE SULFATE 0.125 MG SL SUBL
0.1250 mg | SUBLINGUAL_TABLET | SUBLINGUAL | Status: DC | PRN
  Filled 2011-07-13: qty 1

## 2011-07-13 MED ORDER — HYDROMORPHONE HCL PF 1 MG/ML IJ SOLN
INTRAMUSCULAR | Status: AC
  Filled 2011-07-13: qty 1

## 2011-07-13 MED ORDER — HYDROMORPHONE HCL PF 1 MG/ML IJ SOLN
0.2500 mg | INTRAMUSCULAR | Status: DC | PRN
  Administered 2011-07-13 (×4): 0.25 mg via INTRAVENOUS

## 2011-07-13 MED ORDER — GELATIN ADSORBABLE OP FILM
ORAL_FILM | OPHTHALMIC | Status: DC | PRN
  Administered 2011-07-13: 1

## 2011-07-13 MED ORDER — CEPHALEXIN 500 MG PO CAPS
500.0000 mg | ORAL_CAPSULE | Freq: Four times a day (QID) | ORAL | Status: DC
  Administered 2011-07-13 – 2011-07-14 (×2): 500 mg via ORAL
  Filled 2011-07-13 (×5): qty 1

## 2011-07-13 MED ORDER — OXYCODONE-ACETAMINOPHEN 5-325 MG PO TABS
1.0000 | ORAL_TABLET | ORAL | Status: DC | PRN
  Administered 2011-07-14: 1 via ORAL
  Filled 2011-07-13 (×2): qty 1

## 2011-07-13 MED ORDER — SODIUM CHLORIDE 0.9 % IV SOLN
INTRAVENOUS | Status: DC
  Administered 2011-07-13: 100 mL/h via INTRAVENOUS
  Administered 2011-07-13: 23:00:00 via INTRAVENOUS

## 2011-07-13 MED ORDER — NEOMYCIN-BACITRACIN ZN-POLYMYX 5-400-10000 OP OINT
TOPICAL_OINTMENT | OPHTHALMIC | Status: DC | PRN
  Administered 2011-07-13: 1

## 2011-07-13 MED ORDER — MIDAZOLAM HCL 5 MG/5ML IJ SOLN
INTRAMUSCULAR | Status: DC | PRN
  Administered 2011-07-13: 1 mg via INTRAVENOUS

## 2011-07-13 MED ORDER — NEOSTIGMINE METHYLSULFATE 1 MG/ML IJ SOLN
INTRAMUSCULAR | Status: DC | PRN
  Administered 2011-07-13: 3 mg via INTRAVENOUS

## 2011-07-13 MED ORDER — GLYCOPYRROLATE 0.2 MG/ML IJ SOLN
INTRAMUSCULAR | Status: DC | PRN
  Administered 2011-07-13: .5 mg via INTRAVENOUS

## 2011-07-13 MED ORDER — LACTATED RINGERS IV SOLN
INTRAVENOUS | Status: DC | PRN
  Administered 2011-07-13 (×2): via INTRAVENOUS

## 2011-07-13 MED ORDER — OMEPRAZOLE MAGNESIUM 20 MG PO TBEC: 20.0000 mg | DELAYED_RELEASE_TABLET | Freq: Every day | ORAL | Status: DC

## 2011-07-13 MED ORDER — FENTANYL CITRATE 0.05 MG/ML IJ SOLN
INTRAMUSCULAR | Status: DC | PRN
  Administered 2011-07-13 (×3): 50 ug via INTRAVENOUS
  Administered 2011-07-13: 25 ug via INTRAVENOUS
  Administered 2011-07-13: 100 ug via INTRAVENOUS
  Administered 2011-07-13 (×2): 25 ug via INTRAVENOUS
  Administered 2011-07-13 (×2): 50 ug via INTRAVENOUS

## 2011-07-13 MED ORDER — BSS IO SOLN
INTRAOCULAR | Status: DC | PRN
  Administered 2011-07-13: 15 mL via INTRAOCULAR

## 2011-07-13 MED ORDER — OXYCODONE-ACETAMINOPHEN 5-325 MG PO TABS: 1.0000 | ORAL_TABLET | ORAL | Status: DC | PRN

## 2011-07-13 MED ORDER — ONDANSETRON HCL 4 MG/2ML IJ SOLN: 4.0000 mg | INTRAMUSCULAR | Status: DC | PRN

## 2011-07-13 MED ORDER — LOSARTAN POTASSIUM 50 MG PO TABS
100.0000 mg | ORAL_TABLET | Freq: Every evening | ORAL | Status: DC
  Administered 2011-07-13: 100 mg via ORAL
  Filled 2011-07-13 (×2): qty 2

## 2011-07-13 MED ORDER — PANTOPRAZOLE SODIUM 40 MG PO TBEC
40.0000 mg | DELAYED_RELEASE_TABLET | Freq: Every day | ORAL | Status: DC
  Administered 2011-07-13 – 2011-07-14 (×2): 40 mg via ORAL
  Filled 2011-07-13 (×2): qty 1

## 2011-07-13 MED ORDER — LIDOCAINE-EPINEPHRINE 1 %-1:100000 IJ SOLN
INTRAMUSCULAR | Status: DC | PRN
  Administered 2011-07-13: 9.5 mL

## 2011-07-13 MED ORDER — VECURONIUM BROMIDE 10 MG IV SOLR
INTRAVENOUS | Status: DC | PRN
  Administered 2011-07-13: 2 mg via INTRAVENOUS
  Administered 2011-07-13: 1 mg via INTRAVENOUS
  Administered 2011-07-13: 6 mg via INTRAVENOUS

## 2011-07-13 MED ORDER — IBUPROFEN 100 MG/5ML PO SUSP
400.0000 mg | Freq: Four times a day (QID) | ORAL | Status: DC | PRN
  Filled 2011-07-13: qty 20

## 2011-07-13 MED ORDER — NEOMYCIN-POLYMYXIN-DEXAMETH 3.5-10000-0.1 OP OINT
TOPICAL_OINTMENT | Freq: Three times a day (TID) | OPHTHALMIC | Status: DC
  Administered 2011-07-13 – 2011-07-14 (×3): via OPHTHALMIC
  Filled 2011-07-13: qty 3.5

## 2011-07-13 MED ORDER — PROPOFOL 10 MG/ML IV EMUL
INTRAVENOUS | Status: DC | PRN
  Administered 2011-07-13: 150 mg via INTRAVENOUS

## 2011-07-13 SURGICAL SUPPLY — 57 items
BIT DRILL UPPR FCE 0.9M 4 TWST (BIT) IMPLANT
CANISTER SUCTION 2500CC (MISCELLANEOUS) ×2 IMPLANT
CLEANER TIP ELECTROSURG 2X2 (MISCELLANEOUS) ×1 IMPLANT
CLOTH BEACON ORANGE TIMEOUT ST (SAFETY) ×2 IMPLANT
CONFORMERS SILICONE 5649 (OPHTHALMIC RELATED) ×4 IMPLANT
CORDS BIPOLAR (ELECTRODE) ×1 IMPLANT
COVER SURGICAL LIGHT HANDLE (MISCELLANEOUS) ×2 IMPLANT
COVER TABLE BACK 60X90 (DRAPES) IMPLANT
CRADLE DONUT ADULT HEAD (MISCELLANEOUS) IMPLANT
DECANTER SPIKE VIAL GLASS SM (MISCELLANEOUS) ×1 IMPLANT
DRAPE PROXIMA HALF (DRAPES) IMPLANT
DRILL UPPERFACE 0.9 M , 4 MM TWIST ×1 IMPLANT
DRILL UPPERFACE 0.9M 4MM TWIST (BIT) ×2
ELECT COATED BLADE 2.86 ST (ELECTRODE) IMPLANT
ELECT NDL TIP 2.8 STRL (NEEDLE) IMPLANT
ELECT NEEDLE TIP 2.8 STRL (NEEDLE) IMPLANT
ELECT REM PT RETURN 9FT ADLT (ELECTROSURGICAL) ×2
ELECTRODE REM PT RTRN 9FT ADLT (ELECTROSURGICAL) ×1 IMPLANT
GLOVE ECLIPSE 8.0 STRL XLNG CF (GLOVE) ×2 IMPLANT
GLOVE SURG SS PI 6.5 STRL IVOR (GLOVE) ×2 IMPLANT
GLOVE SURG SS PI 7.0 STRL IVOR (GLOVE) ×1 IMPLANT
GLOVE SURG SS PI 8.0 STRL IVOR (GLOVE) ×2 IMPLANT
GOWN PREVENTION PLUS XLARGE (GOWN DISPOSABLE) ×3 IMPLANT
GOWN STRL NON-REIN LRG LVL3 (GOWN DISPOSABLE) ×4 IMPLANT
KIT BASIN OR (CUSTOM PROCEDURE TRAY) ×2 IMPLANT
KIT ROOM TURNOVER OR (KITS) ×2 IMPLANT
NS IRRIG 1000ML POUR BTL (IV SOLUTION) ×2 IMPLANT
PAD ARMBOARD 7.5X6 YLW CONV (MISCELLANEOUS) ×4 IMPLANT
PENCIL BUTTON HOLSTER BLD 10FT (ELECTRODE) ×2 IMPLANT
PLATE UPPER FACE .6X10H CURV (Plate) IMPLANT
PLATE UPPER FACE 10H CURVED (Plate) ×2 IMPLANT
PLATE UPPER FACE 4H ORBITAL (Plate) ×1 IMPLANT
SCISSORS WIRE ANG 4 3/4 DISP (INSTRUMENTS) IMPLANT
SCREW UPPERFACE 1.2X4M SLF TAP (Screw) ×11 IMPLANT
SCREW UPPERFACE 1.2X6M SLF TAP (Screw) ×1 IMPLANT
SCRUB PCMX 4 OZ (MISCELLANEOUS) ×1 IMPLANT
STAPLER VISISTAT 35W (STAPLE) ×1 IMPLANT
SUCTION FRAZIER TIP 10 FR DISP (SUCTIONS) ×1 IMPLANT
SUCTION FRAZIER TIP 8 FR DISP (SUCTIONS) ×2
SUCTION TUBE FRAZIER 8FR DISP (SUCTIONS) IMPLANT
SUT CHROMIC 3 0 PS 2 (SUTURE) ×2 IMPLANT
SUT CHROMIC 4 0 P 3 18 (SUTURE) ×2 IMPLANT
SUT CHROMIC 5 0 P 3 (SUTURE) IMPLANT
SUT ETHILON 5 0 P 3 18 (SUTURE) ×1
SUT ETHILON 6 0 P 1 (SUTURE) ×2 IMPLANT
SUT NYLON ETHILON 5-0 P-3 1X18 (SUTURE) ×1 IMPLANT
SUT PLAIN 5 0 P 3 18 (SUTURE) ×1 IMPLANT
SUT SILK 2 0 FS (SUTURE) ×1 IMPLANT
SUT STEEL 0 (SUTURE)
SUT STEEL 0 18XMFL TIE 17 (SUTURE) IMPLANT
SUT STEEL 1 (SUTURE) IMPLANT
SUT STEEL 2 (SUTURE) IMPLANT
TOWEL OR 17X24 6PK STRL BLUE (TOWEL DISPOSABLE) ×2 IMPLANT
TOWEL OR 17X26 10 PK STRL BLUE (TOWEL DISPOSABLE) ×2 IMPLANT
TOWEL OR NON WOVEN STRL DISP B (DISPOSABLE) ×1 IMPLANT
TRAY ENT MC OR (CUSTOM PROCEDURE TRAY) ×2 IMPLANT
WATER STERILE IRR 1000ML POUR (IV SOLUTION) ×2 IMPLANT

## 2011-07-13 NOTE — Progress Notes (Signed)
07/13/2011 8:39 PM  Debra White [id number]  Post-Op Check    Temp:  [97.6 F (36.4 C)-97.9 F (36.6 C)] 97.7 F (36.5 C) (01/18 1823) Pulse Rate:  [54-82] 82  (01/18 1823) Resp:  [12-18] 17  (01/18 1823) BP: (118-186)/(49-80) 155/69 mmHg (01/18 1823) SpO2:  [90 %-100 %] 99 % (01/18 1823),   Nursing vision assessments  Intact.   Intake/Output Summary (Last 24 hours) at 07/13/11 2039 Last data filed at 07/13/11 1700  Gross per 24 hour  Intake 1721.67 ml  Output    225 ml  Net 1496.67 ml   Good po.  Neg N,V.  + void  No results found for this or any previous visit (from the past 24 hour(s)).  SUBJECTIVE:  Mild discomfort.  Anxiety.  Heartburn.  Breathing well.  No chest pain.  OBJECTIVE:  Mild peri-orbital swelling and ecchymotic discoloration.  Vision intact OU with good ROM  IMPRESSION:  Satisfactory check.  PLAN:  Maalox.  Xanax.  Follow vision checks.  Anticipate D/C in AM.  Flo Shanks T

## 2011-07-13 NOTE — Anesthesia Preprocedure Evaluation (Addendum)
Anesthesia Evaluation  Patient identified by MRN, date of birth, ID band Patient awake    Reviewed: Allergy & Precautions, H&P , NPO status , Patient's Chart, lab work & pertinent test results  History of Anesthesia Complications Negative for: history of anesthetic complications  Airway       Dental   Pulmonary  clear to auscultation  Pulmonary exam normal       Cardiovascular hypertension, Pt. on medications + dysrhythmias Regular Normal- Systolic murmurs    Neuro/Psych  Headaches,  Neuromuscular disease    GI/Hepatic Neg liver ROS, GERD-  Controlled,  Endo/Other  Negative Endocrine ROS  Renal/GU negative Renal ROS     Musculoskeletal  (+) Fibromyalgia -  Abdominal   Peds  Hematology   Anesthesia Other Findings   Reproductive/Obstetrics                           Anesthesia Physical Anesthesia Plan  ASA: III  Anesthesia Plan: General   Post-op Pain Management:    Induction: Intravenous  Airway Management Planned: Oral ETT  Additional Equipment:   Intra-op Plan:   Post-operative Plan:   Informed Consent: I have reviewed the patients History and Physical, chart, labs and discussed the procedure including the risks, benefits and alternatives for the proposed anesthesia with the patient or authorized representative who has indicated his/her understanding and acceptance.     Plan Discussed with: CRNA, Anesthesiologist and Surgeon  Anesthesia Plan Comments:         Anesthesia Quick Evaluation

## 2011-07-13 NOTE — Anesthesia Postprocedure Evaluation (Signed)
Anesthesia Post Note  Patient: Debra White  Procedure(s) Performed:  OPEN REDUCTION INTERNAL FIXATION (ORIF) TRIPOD FRACTURE - ORIF RIGHT ZYGOMA, ORBITAL FLOOR EXPLORATION WITH FROST STITCH (TEMPORARY TARSORRHAPHY)  Anesthesia type: general  Patient location: PACU  Post pain: Pain level controlled  Post assessment: Patient's Cardiovascular Status Stable  Last Vitals:  Filed Vitals:   07/13/11 1215  BP:   Pulse: 71  Temp:   Resp: 14    Post vital signs: Reviewed and stable  Level of consciousness: sedated  Complications: No apparent anesthesia complications

## 2011-07-13 NOTE — H&P (View-Only) (Signed)
Debra White, Debra White 78 y.o., female [id number]     Chief Complaint: Depressed RIGHT zygomatic fracture, including orbital floor fracture  HPI: 78 year old white female was standing full upright on the bed changing a light bulb, and fell off, striking her face against a bedside table.  She did not lose consciousness.  She was seen in the emergency room where a CT scan demonstrated a depressed fracture of the right eye, with disruption of the orbital floor.  I did review these films.  Subsequently, she developed additional aches and pains including her RIGHT shoulder and RIGHT knee.  She has some pre-existing arthralgias and may even carry the diagnosis of fibromyalgia.  From the emergency room, she was given Tylenol for pain, Keflex antibiosis, had ice and elevation recommended, no nose blowing, and last week saw Dr. Vonna Kotyk, ophthalmologist.  She is here for further evaluation of the injury.  She feels like the vision in the right eye is slightly blurry.  Occlusion feels okay, but the jaw is painful with range of motion.  Mild discomfort in the right eye with range of motion.  No obvious diplopia.  She does complain of numbness in the RIGHT mid face including nose and upper lip.  PMH: Past Medical History  Diagnosis Date  . Hypertension   . Hyperlipemia   . Fibromyalgia   . Anxiety   . Dysrhythmia     RX  . Blood transfusion   . Chronic kidney disease     STONES  . GERD (gastroesophageal reflux disease)   . Headache   . Arthritis     Surg Hx: Past Surgical History  Procedure Date  . Abdominal hysterectomy   . Hand surgery     BIL   . Shoulder arthroscopy w/ rotator cuff repair     LFT  . Cervical disc surgery   . Back surgery     X2    FHx:  No family history on file. SocHx:  reports that she has never smoked. She does not have any smokeless tobacco history on file. She reports that she does not drink alcohol or use illicit drugs.  ALLERGIES:  Allergies  Allergen Reactions  .  Latex Hives    TONGUE HAD BLISTERS WHEN HAD DENTAL SURGERY  . Aspirin     Gi upset  . Codeine Itching    Medications Prior to Admission  Medication Sig Dispense Refill  . ALPRAZolam (XANAX) 1 MG tablet Take 0.5-1 mg by mouth 3 (three) times daily as needed. For anxiety      . atenolol (TENORMIN) 100 MG tablet Take 100 mg by mouth every morning.       . Calcium Carbonate-Vitamin D (CALCIUM 600 + D PO) Take 1 tablet by mouth daily.      . cephALEXin (KEFLEX) 500 MG capsule Take 500 mg by mouth 4 (four) times daily.      . cholecalciferol (VITAMIN D) 1000 UNITS tablet Take 2,000 Units by mouth daily.      . fish oil-omega-3 fatty acids 1000 MG capsule Take 1 g by mouth 3 (three) times daily.      . Flaxseed, Linseed, (FLAX SEED OIL) 1300 MG CAPS Take 1 capsule by mouth 2 (two) times daily.      . hyoscyamine (LEVSIN SL) 0.125 MG SL tablet Place 0.125 mg under the tongue every 4 (four) hours as needed.      Marland Kitchen losartan (COZAAR) 100 MG tablet Take 100 mg by mouth every evening.      Marland Kitchen  Magnesium 250 MG TABS Take 250 mg by mouth 3 (three) times daily.      . Multiple Vitamins-Minerals (MULTIVITAMINS THER. W/MINERALS) TABS Take 1 tablet by mouth daily.      Marland Kitchen omeprazole (PRILOSEC OTC) 20 MG tablet Take 20 mg by mouth daily.      Marland Kitchen OVER THE COUNTER MEDICATION Take 1 capsule by mouth daily. Pt. Takes a over the counter stool softner      . oxyCODONE-acetaminophen (PERCOCET) 5-325 MG per tablet Take 1 tablet by mouth every 4 (four) hours as needed. For pain      . pravastatin (PRAVACHOL) 40 MG tablet Take 40 mg by mouth daily.      . Probiotic Product (PHILLIPS COLON HEALTH PO) Take 1 tablet by mouth daily.      . traMADol (ULTRAM) 50 MG tablet Take 50 mg by mouth every 6 (six) hours as needed. For pain       No current facility-administered medications on file as of 07/12/2011.    Results for orders placed during the hospital encounter of 07/11/11 (from the past 48 hour(s))  SURGICAL PCR SCREEN      Status: Normal   Collection Time   07/11/11  2:31 PM      Component Value Range Comment   MRSA, PCR NEGATIVE  NEGATIVE     Staphylococcus aureus NEGATIVE  NEGATIVE    BASIC METABOLIC PANEL     Status: Abnormal   Collection Time   07/11/11  3:00 PM      Component Value Range Comment   Sodium 132 (*) 135 - 145 (mEq/L)    Potassium 4.0  3.5 - 5.1 (mEq/L)    Chloride 92 (*) 96 - 112 (mEq/L)    CO2 32  19 - 32 (mEq/L)    Glucose, Bld 98  70 - 99 (mg/dL)    BUN 15  6 - 23 (mg/dL)    Creatinine, Ser 9.14  0.50 - 1.10 (mg/dL)    Calcium 9.4  8.4 - 10.5 (mg/dL)    GFR calc non Af Amer 82 (*) >90 (mL/min)    GFR calc Af Amer >90  >90 (mL/min)   CBC     Status: Abnormal   Collection Time   07/11/11  3:00 PM      Component Value Range Comment   WBC 11.6 (*) 4.0 - 10.5 (K/uL)    RBC 3.95  3.87 - 5.11 (MIL/uL)    Hemoglobin 12.0  12.0 - 15.0 (g/dL)    HCT 78.2 (*) 95.6 - 46.0 (%)    MCV 89.1  78.0 - 100.0 (fL)    MCH 30.4  26.0 - 34.0 (pg)    MCHC 34.1  30.0 - 36.0 (g/dL)    RDW 21.3  08.6 - 57.8 (%)    Platelets 253  150 - 400 (K/uL)    Dg Chest 2 View  07/11/2011  *RADIOLOGY REPORT*  Clinical Data: Preoperative evaluation.  History of sternal fracture.  History of hypertension.  History of acute bronchitis.  CHEST - 2 VIEW  Comparison: None.  Findings: There is slight enlargement cardiac silhouette.  No pulmonary edema, pneumonia, or pleural effusion is seen.  Slight tortuosity of descending thoracic aorta is seen.  History given of sternal fracture.  Detail of sternum on lateral image using chest technique is not adequate to evaluate the sternum.  There is osteopenic appearance of bones.  IMPRESSION: Slight enlargement cardiac silhouette.  No pulmonary edema, pneumonia, or pleural effusion.  Original  Report Authenticated By: Crawford Givens, M.D.    ROS:non contrib except as in HPI.  There were no vitals taken for this visit.  PHYSICAL EXAM:  She is thin and basically healthy although appears  frustrated and tired.  She has flattening of the RIGHT malar eminence.  She has periorbital ecchymosis on the RIGHT side.  She has some halitosis consistent with old blood.  Mental status seems appropriate. She wears hearing aids on both sides and is somewhat hard of hearing in conversational speech without them.  As best I could tell, she has full range of motion with conjugate gaze.  She describes subjective blurriness on the RIGHT.  Ear canals are clear with normal aerated drums.  Internal nose is clear.  Oral cavity is moist with a partial upper and lower plates.  No true trismus or malocclusion.  Oropharynx clear.  Neck unremarkable.   Palpation of the bony facial skeleton reveals depression of the zygomatic arch and malar eminence, and a 6 mm step off of the RIGHT infraorbital rim.   Studies Reviewed:Maxillofacial CT scan with 3D reconstructions.    Assessment/Plan Displaced/depressed RIGHT zygoma fracture with some disruption of the orbital floor.  She is having numbness consistent with an infraorbital nerve injury.  She has pain with range of motion of the jaw consistent with impingement of the zygomatic arch on the temporalis muscle.  Some blurred vision on the RIGHT for which she is under the care of Dr. Vonna Kotyk, ophthalmologist.  In fact, she is seeing him again tomorrow.  I recommend ORIF RIGHT zygoma with orbital floor exploration and with a Frost stitch temporary tarsorrhaphy.  I would like to get around to doing this later this week or early next week.  I spoke with Dr. Oneta Rack, her family physician,  who felt like she should be okay for general anesthesia.   I discussed the surgery in detail including risks and complications.  Questions were answered and informed consent was obtained.  We will do this at [facility] main hospital and observe her 23 hours extended recovery.  Postoperative prescriptions for Vicodin and Keflex rested and given.  I will see her 10 days after surgery here in the office  for suture removal.  I discussed advancement of diet and activity, and further proscription against nose blowing for 2 weeks after the surgery.  Flo Shanks T 07/12/2011, 12:19 PM

## 2011-07-13 NOTE — Transfer of Care (Signed)
Immediate Anesthesia Transfer of Care Note  Patient: Debra White  Procedure(s) Performed:  OPEN REDUCTION INTERNAL FIXATION (ORIF) TRIPOD FRACTURE - ORIF RIGHT ZYGOMA, ORBITAL FLOOR EXPLORATION WITH FROST STITCH (TEMPORARY TARSORRHAPHY)  Patient Location: PACU  Anesthesia Type: General  Level of Consciousness: oriented, patient cooperative and responds to stimulation  Airway & Oxygen Therapy: Patient Spontanous Breathing and Patient connected to nasal cannula oxygen  Post-op Assessment: Report given to PACU RN, Post -op Vital signs reviewed and stable and Patient moving all extremities X 4  Post vital signs: Reviewed Filed Vitals:   07/13/11 0632  BP: 118/49  Pulse: 54  Temp: 36.6 C  Resp: 16    Complications: No apparent anesthesia complications

## 2011-07-13 NOTE — Preoperative (Signed)
Beta Blockers   Reason not to administer Beta Blockers:Not Applicable, Took Atenolol this AM

## 2011-07-13 NOTE — Interval H&P Note (Signed)
History and Physical Interval Note:  07/13/2011 8:45 AM  Debra White  has presented today for surgery, with the diagnosis of RIGHT DEPRESSED ZYGOMA FRACTURE  The various methods of treatment have been discussed with the patient and family. After consideration of risks, benefits and other options for treatment, the patient has consented to  Procedure(s): OPEN REDUCTION INTERNAL FIXATION (ORIF) RIGHT TRIPOD FRACTURE, ORBITAL FLOOR EXPLORATION, TEMPORARY TARSORRHAPHY as a surgical intervention .  The patients' history has been re-reviewed, patient re-examined, no change in status, stable for surgery.  I have reviewed the patients' chart and labs.  Questions were answered to the patient's satisfaction.     [name]

## 2011-07-13 NOTE — Op Note (Signed)
07/13/2011  11:19 AM    Debra White  [id number]   Pre-Op Dx:  Closed depressed right zygoma fracture with orbital floor fracture and dislocation.   Post-op Dx: Same  Proc: Open reduction internal fixation, multiple approaches, depressed right zygoma fracture. Frost stitch temporary tarsorrhaphy, orbital floor exploration.   Surg:  [name] MD  Anes:  GOT  EBL:  20 cc  Comp:  None  Findings:  8mm depression and medial displacement of the right zygoma with a 1 cm mobile mid orbital rim fracture segments. Comminuted fracture of the anterior orbital floor with most of the bone intact and in reasonably good position.  Procedure: The procedure was discussed with the patient in the holding area. Informed consent was confirmed. X-rays were reviewed. The patient was reexamined.  In a comfortable supine position, general orotracheal anesthesia was induced without difficulty. The patient was placed in a slight head elevated position. The identifying initial was noted for orientation. A 6-0 Ethilon "Frost stitch" temporary tarsorrhaphy was applied in the standard fashion. 1% Xylocaine with 1 100,000 epinephrine, 8 cc total was infiltrated into the lateral orbital rim and into the lower lid superficially and deeper,  especially along the infraorbital rim. Several minutes were allowed for this to take effect. A sterile preparation and draping of the right midface was accomplished in the standard fashion.  The face was examined once again. A 2 cm incision beveled with the hair follicles was made over the frontozygomatic suture at the lateral orbital rim. This was carried down through skin orbicularis oculi and periosteum.  A subciliary incision was executed and then extended laterally in the "crow's feet".  A subcutaneous tunnel was developed approximately 6 mm wide and connected to the skin incision. Laterally, the orbicularis oculi muscle was penetrated bluntly and a submuscular  Tunnel  was executed in and communicated with the upper tunnel leaving a muscular sling attached to the upper eyelid. Sharp and blunt dissection carried down to the infraorbital rim. Soft tissue was developed from the displaced fracture step-off. The periosteum was incised at the orbital rim. This was developed down onto the face of the maxilla for a short distance, and then back into the orbital floor. There was extensive comminution of the orbital floor but most pieces were in relatively good position although mobile.  The periosteum was developed off the lateral orbital rim. 8 Joker elevator was passed behind the anterior zygomatic arch and with anterior and right lateral pressure, the zygoma was mobilized. This allowed the 1 cm mid infraorbital rim bone segment to fit in between the 2 fractures.  A 4 hole plate with a central gap was applied at the frontozygomatic suture in the standard fashion to stabilize zygoma. A 10 hole curved plate was fashioned to fit along the infraorbital rim and with the zygoma it in distraction, it was secured. After securing both ends of the plate, the mobile fracture segment in the central portion was secured to the plate.  Good positioning of the zygoma was thought to have been accomplished and a good reconstitution of the infraorbital rim.  At this point both wounds were irrigated and suctioned clear.  Reexploration of the orbital floor showed comminution of the fractures but more room for the buckled portions now that the zygoma had been lateralized. Basically the floor was intact but badly comminuted. A piece of Gelfilm was fashioned and placed to support the floor during the healing process. Once again the wounds were irrigated. Hemostasis was observed.  At all times when exploring the orbital floor with a malleable retractor, the retractor was relaxed every 1- minutes to prevent ischemic damage to the eye.  The periosteum was closed in both wounds with interrupted 4-0  chromic suture. The lateral brow incision was closed with a running simple 4-0 Ethilon suture. The subciliary incision was closed with running simple 5-0 plain gut. Again hemostasis was observed. Maxitrol ointment was applied on both wounds. The Frost stitch was removed. The eye was irrigated with balanced salt solution.   At this point the procedure was completed, the patient was returned to anesthesia, awakened, extubated, and transferred to recovery in stable condition.  Dispo:   PACU to 5100, 23 hours extended recovery.   Plan:  Ice, elevation, analgesia, antibiotics . Vision checks. No nose blowing x2 weeks.  Diet as comfortable.  [name] MD

## 2011-07-14 NOTE — Discharge Summary (Signed)
07/14/2011 12:29 PM  Leandrew Koyanagi [id number]  Post-Op Day 1    Temp:  [97.6 F (36.4 C)-98.5 F (36.9 C)] 98.3 F (36.8 C) (01/19 0519) Pulse Rate:  [69-83] 71  (01/19 0519) Resp:  [12-18] 18  (01/19 0519) BP: (121-175)/(50-80) 133/63 mmHg (01/19 0519) SpO2:  [90 %-99 %] 97 % (01/19 0519) Weight:  [59.285 kg (130 lb 11.2 oz)] 59.285 kg (130 lb 11.2 oz) (01/18 2109),     Intake/Output Summary (Last 24 hours) at 07/14/11 1229 Last data filed at 07/14/11 0527  Gross per 24 hour  Intake 1721.67 ml  Output   1600 ml  Net 121.67 ml    No results found for this or any previous visit (from the past 24 hour(s)).  SUBJECTIVE:  Mild pain. Mild blurred vision OD.  Heartburn.  OBJECTIVE:  Awake, alert.  Min swelling.  Vision intact OU.  EOMI.  Wounds flat and clean  IMPRESSION:  Satisfactory check.  PLAN:  D/C home.  Admit 18 JAN Discharge: 19 JAN Final Diagnosis:  Displaced closed RIGHT zygoma fx. Procedure:  ORIF RIGHT zygoma fx, orbital floor exploration, temporary tarsorrhaphy. Comp:  None Cond:  Ambulatory, vision and EOM intact OU.  Pain controlled.  Full regular diet. Re check: 7-10 days Rx:  At home, Keflex, Vicodin, Maxitrol Ophth ointment Instructions written and given.  [name]

## 2011-07-17 ENCOUNTER — Encounter (HOSPITAL_COMMUNITY): Payer: Self-pay | Admitting: Otolaryngology

## 2011-07-24 ENCOUNTER — Other Ambulatory Visit: Payer: Self-pay | Admitting: Otolaryngology

## 2011-07-24 DIAGNOSIS — S02401A Maxillary fracture, unspecified, initial encounter for closed fracture: Secondary | ICD-10-CM

## 2011-07-24 DIAGNOSIS — S02400A Malar fracture unspecified, initial encounter for closed fracture: Secondary | ICD-10-CM

## 2011-07-27 ENCOUNTER — Ambulatory Visit
Admission: RE | Admit: 2011-07-27 | Discharge: 2011-07-27 | Disposition: A | Discharge status: Home / Self Care | Payer: Medicare Other | Source: Ambulatory Visit | Attending: Otolaryngology | Admitting: Otolaryngology

## 2011-07-27 ENCOUNTER — Other Ambulatory Visit: Payer: Medicare Other

## 2011-07-27 DIAGNOSIS — S02400A Malar fracture unspecified, initial encounter for closed fracture: Secondary | ICD-10-CM

## 2011-07-27 MED ORDER — IOHEXOL 300 MG/ML  SOLN
75.0000 mL | Freq: Once | INTRAMUSCULAR | Status: AC | PRN
  Administered 2011-07-27: 75 mL via INTRAVENOUS

## 2011-08-23 ENCOUNTER — Ambulatory Visit
Admission: RE | Admit: 2011-08-23 | Discharge: 2011-08-23 | Disposition: A | Discharge status: Home / Self Care | Payer: Medicare Other | Source: Ambulatory Visit | Attending: Internal Medicine | Admitting: Internal Medicine

## 2011-08-23 ENCOUNTER — Other Ambulatory Visit: Payer: Self-pay | Admitting: Internal Medicine

## 2011-08-23 DIAGNOSIS — R110 Nausea: Secondary | ICD-10-CM

## 2011-09-01 ENCOUNTER — Encounter (HOSPITAL_COMMUNITY): Payer: Self-pay | Admitting: *Deleted

## 2011-09-01 ENCOUNTER — Emergency Department (HOSPITAL_COMMUNITY)
Admission: EM | Admit: 2011-09-01 | Discharge: 2011-09-01 | Disposition: A | Discharge status: Home / Self Care | Payer: Medicare Other | Attending: Emergency Medicine | Admitting: Emergency Medicine

## 2011-09-01 ENCOUNTER — Emergency Department (HOSPITAL_COMMUNITY): Payer: Medicare Other

## 2011-09-01 DIAGNOSIS — R51 Headache: Secondary | ICD-10-CM | POA: Insufficient documentation

## 2011-09-01 DIAGNOSIS — I10 Essential (primary) hypertension: Secondary | ICD-10-CM | POA: Insufficient documentation

## 2011-09-01 DIAGNOSIS — Z79899 Other long term (current) drug therapy: Secondary | ICD-10-CM | POA: Insufficient documentation

## 2011-09-01 DIAGNOSIS — R112 Nausea with vomiting, unspecified: Secondary | ICD-10-CM | POA: Insufficient documentation

## 2011-09-01 DIAGNOSIS — E785 Hyperlipidemia, unspecified: Secondary | ICD-10-CM | POA: Insufficient documentation

## 2011-09-01 DIAGNOSIS — IMO0001 Reserved for inherently not codable concepts without codable children: Secondary | ICD-10-CM | POA: Insufficient documentation

## 2011-09-01 DIAGNOSIS — K219 Gastro-esophageal reflux disease without esophagitis: Secondary | ICD-10-CM | POA: Insufficient documentation

## 2011-09-01 LAB — DIFFERENTIAL
Basophils Relative: 0 % (ref 0–1)
Eosinophils Absolute: 0.1 10*3/uL (ref 0.0–0.7)
Eosinophils Relative: 1 % (ref 0–5)
Lymphs Abs: 1.8 10*3/uL (ref 0.7–4.0)
Neutrophils Relative %: 73 % (ref 43–77)

## 2011-09-01 LAB — CBC
MCH: 30.8 pg (ref 26.0–34.0)
MCHC: 34.2 g/dL (ref 30.0–36.0)
MCV: 90.2 fL (ref 78.0–100.0)
Platelets: 268 10*3/uL (ref 150–400)
RBC: 3.86 MIL/uL — ABNORMAL LOW (ref 3.87–5.11)

## 2011-09-01 LAB — BASIC METABOLIC PANEL
BUN: 10 mg/dL (ref 6–23)
Calcium: 9.5 mg/dL (ref 8.4–10.5)
GFR calc Af Amer: >90 mL/min (ref >90)
GFR calc non Af Amer: 81 mL/min — ABNORMAL LOW (ref >90)
Glucose, Bld: 106 mg/dL — ABNORMAL HIGH (ref 70–99)
Potassium: 3.5 mEq/L (ref 3.5–5.1)
Sodium: 132 mEq/L — ABNORMAL LOW (ref 135–145)

## 2011-09-01 LAB — URINALYSIS, ROUTINE W REFLEX MICROSCOPIC
Bilirubin Urine: NEGATIVE
Hgb urine dipstick: NEGATIVE
Ketones, ur: 15 mg/dL — AB
Nitrite: NEGATIVE
Protein, ur: NEGATIVE mg/dL
Specific Gravity, Urine: 1.013 (ref 1.005–1.030)
Urobilinogen, UA: 0.2 mg/dL (ref 0.0–1.0)

## 2011-09-01 MED ORDER — METOCLOPRAMIDE HCL 5 MG/ML IJ SOLN
5.0000 mg | Freq: Once | INTRAMUSCULAR | Status: AC
  Administered 2011-09-01: 5 mg via INTRAVENOUS
  Filled 2011-09-01: qty 2

## 2011-09-01 MED ORDER — GADOBENATE DIMEGLUMINE 529 MG/ML IV SOLN
15.0000 mL | Freq: Once | INTRAVENOUS | Status: AC | PRN
  Administered 2011-09-01: 15 mL via INTRAVENOUS

## 2011-09-01 MED ORDER — METOCLOPRAMIDE HCL 10 MG PO TABS: 10.0000 mg | ORAL_TABLET | Freq: Four times a day (QID) | ORAL | Status: DC

## 2011-09-01 MED ORDER — LORAZEPAM 2 MG/ML IJ SOLN
1.0000 mg | Freq: Once | INTRAMUSCULAR | Status: AC
  Administered 2011-09-01: 1 mg via INTRAVENOUS
  Filled 2011-09-01: qty 1

## 2011-09-01 MED ORDER — ONDANSETRON HCL 4 MG/2ML IJ SOLN: 4.0000 mg | Freq: Once | INTRAMUSCULAR | Status: DC

## 2011-09-01 MED ORDER — DIPHENHYDRAMINE HCL 50 MG/ML IJ SOLN
12.5000 mg | Freq: Once | INTRAMUSCULAR | Status: AC
  Administered 2011-09-01: 12.5 mg via INTRAVENOUS
  Filled 2011-09-01: qty 1

## 2011-09-01 MED ORDER — METOCLOPRAMIDE HCL 5 MG/ML IJ SOLN: 5.0000 mg | Freq: Once | INTRAMUSCULAR | Status: DC

## 2011-09-01 MED ORDER — ALPRAZOLAM 0.5 MG PO TABS
1.0000 mg | ORAL_TABLET | Freq: Once | ORAL | Status: AC
  Administered 2011-09-01: 1 mg via ORAL
  Filled 2011-09-01: qty 2

## 2011-09-01 MED ORDER — ONDANSETRON HCL 4 MG/2ML IJ SOLN
4.0000 mg | Freq: Once | INTRAMUSCULAR | Status: AC
  Administered 2011-09-01: 4 mg via INTRAVENOUS
  Filled 2011-09-01: qty 2

## 2011-09-01 MED ORDER — DIPHENHYDRAMINE HCL 50 MG/ML IJ SOLN: 25.0000 mg | Freq: Once | INTRAMUSCULAR | Status: DC

## 2011-09-01 MED ORDER — ONDANSETRON HCL 4 MG/2ML IJ SOLN
INTRAMUSCULAR | Status: AC
  Administered 2011-09-01: 11:00:00
  Filled 2011-09-01: qty 2

## 2011-09-01 MED ORDER — KETOROLAC TROMETHAMINE 30 MG/ML IJ SOLN
30.0000 mg | Freq: Once | INTRAMUSCULAR | Status: AC
  Administered 2011-09-01: 30 mg via INTRAVENOUS
  Filled 2011-09-01: qty 1

## 2011-09-01 MED ORDER — HYDROCODONE-ACETAMINOPHEN 5-325 MG PO TABS
1.0000 | ORAL_TABLET | Freq: Once | ORAL | Status: AC
  Administered 2011-09-01: 1 via ORAL
  Filled 2011-09-01: qty 1

## 2011-09-01 MED ORDER — METOCLOPRAMIDE HCL 5 MG/ML IJ SOLN: 10.0000 mg | Freq: Once | INTRAMUSCULAR | Status: DC

## 2011-09-01 NOTE — Discharge Instructions (Signed)
Headaches, Frequently Asked Questions MIGRAINE HEADACHES Q: What is migraine? What causes it? How can I treat it? A: Generally, migraine headaches begin as a dull ache. Then they develop into a constant, throbbing, and pulsating pain. You may experience pain at the temples. You may experience pain at the front or back of one or both sides of the head. The pain is usually accompanied by a combination of:  Nausea.   Vomiting.   Sensitivity to light and noise.  Some people (about 15%) experience an aura (see below) before an attack. The cause of migraine is believed to be chemical reactions in the brain. Treatment for migraine may include over-the-counter or prescription medications. It may also include self-help techniques. These include relaxation training and biofeedback.  Q: What is an aura? A: About 15% of people with migraine get an "aura". This is a sign of neurological symptoms that occur before a migraine headache. You may see wavy or jagged lines, dots, or flashing lights. You might experience tunnel vision or blind spots in one or both eyes. The aura can include visual or auditory hallucinations (something imagined). It may include disruptions in smell (such as strange odors), taste or touch. Other symptoms include:  Numbness.   A "pins and needles" sensation.   Difficulty in recalling or speaking the correct word.  These neurological events may last as long as 60 minutes. These symptoms will fade as the headache begins. Q: What is a trigger? A: Certain physical or environmental factors can lead to or "trigger" a migraine. These include:  Foods.   Hormonal changes.   Weather.   Stress.  It is important to remember that triggers are different for everyone. To help prevent migraine attacks, you need to figure out which triggers affect you. Keep a headache diary. This is a good way to track triggers. The diary will help you talk to your healthcare professional about your  condition. Q: Does weather affect migraines? A: Bright sunshine, hot, humid conditions, and drastic changes in barometric pressure may lead to, or "trigger," a migraine attack in some people. But studies have shown that weather does not act as a trigger for everyone with migraines. Q: What is the link between migraine and hormones? A: Hormones start and regulate many of your body's functions. Hormones keep your body in balance within a constantly changing environment. The levels of hormones in your body are unbalanced at times. Examples are during menstruation, pregnancy, or menopause. That can lead to a migraine attack. In fact, about three quarters of all women with migraine report that their attacks are related to the menstrual cycle.  Q: Is there an increased risk of stroke for migraine sufferers? A: The likelihood of a migraine attack causing a stroke is very remote. That is not to say that migraine sufferers cannot have a stroke associated with their migraines. In persons under age 40, the most common associated factor for stroke is migraine headache. But over the course of a person's normal life span, the occurrence of migraine headache may actually be associated with a reduced risk of dying from cerebrovascular disease due to stroke.  Q: What are acute medications for migraine? A: Acute medications are used to treat the pain of the headache after it has started. Examples over-the-counter medications, NSAIDs, ergots, and triptans.  Q: What are the triptans? A: Triptans are the newest class of abortive medications. They are specifically targeted to treat migraine. Triptans are vasoconstrictors. They moderate some chemical reactions in the brain.   The triptans work on receptors in your brain. Triptans help to restore the balance of a neurotransmitter called serotonin. Fluctuations in levels of serotonin are thought to be a main cause of migraine.  Q: Are over-the-counter medications for migraine  effective? A: Over-the-counter, or "OTC," medications may be effective in relieving mild to moderate pain and associated symptoms of migraine. But you should see your caregiver before beginning any treatment regimen for migraine.  Q: What are preventive medications for migraine? A: Preventive medications for migraine are sometimes referred to as "prophylactic" treatments. They are used to reduce the frequency, severity, and length of migraine attacks. Examples of preventive medications include antiepileptic medications, antidepressants, beta-blockers, calcium channel blockers, and NSAIDs (nonsteroidal anti-inflammatory drugs). Q: Why are anticonvulsants used to treat migraine? A: During the past few years, there has been an increased interest in antiepileptic drugs for the prevention of migraine. They are sometimes referred to as "anticonvulsants". Both epilepsy and migraine may be caused by similar reactions in the brain.  Q: Why are antidepressants used to treat migraine? A: Antidepressants are typically used to treat people with depression. They may reduce migraine frequency by regulating chemical levels, such as serotonin, in the brain.  Q: What alternative therapies are used to treat migraine? A: The term "alternative therapies" is often used to describe treatments considered outside the scope of conventional Western medicine. Examples of alternative therapy include acupuncture, acupressure, and yoga. Another common alternative treatment is herbal therapy. Some herbs are believed to relieve headache pain. Always discuss alternative therapies with your caregiver before proceeding. Some herbal products contain arsenic and other toxins. TENSION HEADACHES Q: What is a tension-type headache? What causes it? How can I treat it? A: Tension-type headaches occur randomly. They are often the result of temporary stress, anxiety, fatigue, or anger. Symptoms include soreness in your temples, a tightening  band-like sensation around your head (a "vice-like" ache). Symptoms can also include a pulling feeling, pressure sensations, and contracting head and neck muscles. The headache begins in your forehead, temples, or the back of your head and neck. Treatment for tension-type headache may include over-the-counter or prescription medications. Treatment may also include self-help techniques such as relaxation training and biofeedback. CLUSTER HEADACHES Q: What is a cluster headache? What causes it? How can I treat it? A: Cluster headache gets its name because the attacks come in groups. The pain arrives with little, if any, warning. It is usually on one side of the head. A tearing or bloodshot eye and a runny nose on the same side of the headache may also accompany the pain. Cluster headaches are believed to be caused by chemical reactions in the brain. They have been described as the most severe and intense of any headache type. Treatment for cluster headache includes prescription medication and oxygen. SINUS HEADACHES Q: What is a sinus headache? What causes it? How can I treat it? A: When a cavity in the bones of the face and skull (a sinus) becomes inflamed, the inflammation will cause localized pain. This condition is usually the result of an allergic reaction, a tumor, or an infection. If your headache is caused by a sinus blockage, such as an infection, you will probably have a fever. An x-ray will confirm a sinus blockage. Your caregiver's treatment might include antibiotics for the infection, as well as antihistamines or decongestants.  REBOUND HEADACHES Q: What is a rebound headache? What causes it? How can I treat it? A: A pattern of taking acute headache medications too   often can lead to a condition known as "rebound headache." A pattern of taking too much headache medication includes taking it more than 2 days per week or in excessive amounts. That means more than the label or a caregiver advises.  With rebound headaches, your medications not only stop relieving pain, they actually begin to cause headaches. Doctors treat rebound headache by tapering the medication that is being overused. Sometimes your caregiver will gradually substitute a different type of treatment or medication. Stopping may be a challenge. Regularly overusing a medication increases the potential for serious side effects. Consult a caregiver if you regularly use headache medications more than 2 days per week or more than the label advises. ADDITIONAL QUESTIONS AND ANSWERS Q: What is biofeedback? A: Biofeedback is a self-help treatment. Biofeedback uses special equipment to monitor your body's involuntary physical responses. Biofeedback monitors:  Breathing.   Pulse.   Heart rate.   Temperature.   Muscle tension.   Brain activity.  Biofeedback helps you refine and perfect your relaxation exercises. You learn to control the physical responses that are related to stress. Once the technique has been mastered, you do not need the equipment any more. Q: Are headaches hereditary? A: Four out of five (80%) of people that suffer report a family history of migraine. Scientists are not sure if this is genetic or a family predisposition. Despite the uncertainty, a child has a 50% chance of having migraine if one parent suffers. The child has a 75% chance if both parents suffer.  Q: Can children get headaches? A: By the time they reach high school, most young people have experienced some type of headache. Many safe and effective approaches or medications can prevent a headache from occurring or stop it after it has begun.  Q: What type of doctor should I see to diagnose and treat my headache? A: Start with your primary caregiver. Discuss his or her experience and approach to headaches. Discuss methods of classification, diagnosis, and treatment. Your caregiver may decide to recommend you to a headache specialist, depending upon  your symptoms or other physical conditions. Having diabetes, allergies, etc., may require a more comprehensive and inclusive approach to your headache. The National Head[employer] Foundation will provide, upon request, a list of [facility] physician members in your state. Document Released: 09/01/2003 Document Revised: 05/31/2011 Document Reviewed: 02/09/2008 [facility] Patient Information 2012 [place], [place].Headache Headaches are caused by many different problems. Most commonly, headache is caused by muscle tension from an injury, fatigue, or emotional upset. Excessive muscle contractions in the scalp and neck result in a headache that often feels like a tight band around the head. Tension headaches often have areas of tenderness over the scalp and the back of the neck. These headaches may last for hours, days, or longer, and some may contribute to migraines in those who have migraine problems. Migraines usually cause a throbbing headache, which is made worse by activity. Sometimes only one side of the head hurts. Nausea, vomiting, eye pain, and avoidance of food are common with migraines. Visual symptoms such as light sensitivity, blind spots, or flashing lights may also occur. Loud noises may worsen migraine headaches. Many factors may cause migraine headaches:  Emotional stress, lack of sleep, and menstrual periods.   Alcohol and some drugs (such as birth control pills).   Diet factors (fasting, caffeine, food preservatives, chocolate).   Environmental factors (weather changes, bright lights, odors, smoke).  Other causes of headaches include minor injuries to the head. Arthritis in the  neck; problems with the jaw, eyes, ears, or nose are also causes of headaches. Allergies, drugs, alcohol, and exposure to smoke can also cause moderate headaches. Rebound headaches can occur if someone uses pain medications for a long period of time and then stops. Less commonly, blood vessel problems in the neck and brain  (including stroke) can cause various types of headache. Treatment of headaches includes medicines for pain and relaxation. Ice packs or heat applied to the back of the head and neck help some people. Massaging the shoulders, neck and scalp are often very useful. Relaxation techniques and stretching can help prevent these headaches. Avoid alcohol and cigarette smoking as these tend to make headaches worse. Please see your caregiver if your headache is not better in 2 days.  SEEK IMMEDIATE MEDICAL CARE IF:   You develop a high fever, chills, or repeated vomiting.   You faint or have difficulty with vision.   You develop unusual numbness or weakness of your arms or legs.   Relief of pain is inadequate with medication, or you develop severe pain.   You develop confusion, or neck stiffness.   You have a worsening of a headache or do not obtain relief.  Document Released: 06/11/2005 Document Revised: 05/31/2011 Document Reviewed: 12/05/2006 [facility] Patient Information 2012 [place], [place].

## 2011-09-01 NOTE — ED Notes (Signed)
Patient transported to x-ray. ?

## 2011-09-01 NOTE — ED Provider Notes (Signed)
Pt handed off to me by Dr. Devoria Albe and Doran Durand, PA-C. Pt awaiting MRI to evaluate headaches s/p fall in January with associated nausea and vomitig for the past few months as well.  MRI shows.  MR Brain W Wo Contrast (Final result)   Result time:09/01/11 1751    Final result by Rad Results In Interface (09/01/11 17:51:26)    Narrative:   *RADIOLOGY REPORT*  Clinical Data: New onset right-sided weakness.  MRI HEAD WITHOUT AND WITH CONTRAST  Technique: Multiplanar, multiecho pulse sequences of the brain and surrounding structures were obtained according to standard protocol without and with intravenous contrast  Contrast: 15mL MULTIHANCE GADOBENATE DIMEGLUMINE 529 MG/ML IV SOLN  Comparison: Head CT same day and multiple previous  Findings: There is a 6 mm focus of abnormal T Q signal within the right parietal subcortical white matter. This shows high signal on diffusion imaging but does not show low signal on the ADC map and therefore is not true restricted diffusion. This was not present on the CT scan of 07/04/2011. It probably represents a late subacute white matter infarction. No other acute or subacute finding. The brainstem and cerebellum are normal. The cerebral hemispheres show a few other punctate foci of T2 and FLAIR signal within the white matter consistent with minimal chronic small vessel change, less than often seen in healthy individuals of this age. After contrast administration, no abnormal enhancement occurs. No mass lesion, hemorrhage, hydrocephalus or extra-axial collection. No pituitary mass. No inflammatory sinus disease. Some post-traumatic sequela of blood clot in the right maxillary sinus relates to the previous orbital fracture.  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 This is probably 68-35 weeks old at least.  Minor chronic small vessel change elsewhere within  the brain.  Original Report Authenticated By: Thomasenia Sales, M.D.   As discussed with Herbert Seta, the MRI show no acute findings. I have made the patient aware that the small infarct she was already made aware of was the only abnormality found on CT scan of significance. The patient will be given an Rx for Reglan and asked to follow up with Dr. Judie Petit (her PCP) early this week for follow-up appointment.  Dorthula Matas, PA 09/01/11 [phone]

## 2011-09-01 NOTE — ED Notes (Signed)
Per EMS - pt had a fall in early January and sustained a head injury.  Pt states she had a metal "plate" surgically inserted in her head.  Pt has had headache with N/V since this a.m.  Pt and family told EMS that pt has a history of anxiety and is often nauseated related to that.  Pt states she was unable to take her Xanax this morning because she was nauseated.  Pt's vitals WNL - with some HTN.  180/88, NSR.  Pt has a saline lock in her right hand and received 4 mg of Zofran en route with EMS.

## 2011-09-01 NOTE — ED Provider Notes (Signed)
See prior note   Ward Givens, MD 09/01/11 1949

## 2011-09-01 NOTE — ED Notes (Signed)
Patient resting with family at bedside. Patient states she still has a headache.

## 2011-09-01 NOTE — ED Notes (Signed)
Patient states she is nauseated and stomach feels like it is burning and has a headache. Family at bedside.

## 2011-09-01 NOTE — ED Notes (Signed)
Patient states she is comfortable and denies any nausea. Food was given earlier and patient has had no episodes of vomiting since eating. Family at bedside.

## 2011-09-01 NOTE — ED Notes (Signed)
Patient transported to MRI 

## 2011-09-01 NOTE — ED Notes (Signed)
Patient states she still has a headache and would like pain medication. EDP advised.

## 2011-09-01 NOTE — ED Notes (Signed)
Patient experiencing nausea and spitting into bag. No vomiting witnessed.

## 2011-09-01 NOTE — ED Notes (Signed)
Patient return from x-ray 

## 2011-09-01 NOTE — ED Provider Notes (Signed)
Patient relates she fell January 9 and had facial fractures that were repaired. She relates she never had headaches in her life before and after she fell. She relates she has been having headache at the back of her head that today has been throbbing. She has photophobia but not making her headache worse but her eyes are sensitive which is a chronic problem. She's had chronic nausea and vomiting also since she fell and injured her self and states she's not sleeping at night. She states laying flat makes her headache feel better and sitting up makes it feel worse. She also relates she has back pain since she fell.  Patient is alert and cooperative neuro appears to be intact.  PCP Dr Oneta Rack  Have discussed care plan with her PA  Medical screening examination/treatment/procedure(s) were conducted as a shared visit with non-physician practitioner(s) and myself.  I personally evaluated the patient during the encounter Devoria Albe, MD, Franz Dell, MD 09/01/11 [phone]

## 2011-09-01 NOTE — ED Notes (Signed)
Patient resting with NAD at this time. Family at bedside.  

## 2011-09-01 NOTE — ED Provider Notes (Signed)
History     CSN: [id number]  Arrival date & time 09/01/11  1034   None     Chief Complaint  Patient presents with  . Nausea  . Emesis  . Headache    (Consider location/radiation/quality/duration/timing/severity/associated sxs/prior treatment) HPI Comments: Patient reports that she had a fall and a head injury back in January of this year.  She reports that she has had nausea, vomiting, and headaches daily since that injury.  She reports that the vomiting occurs every morning and usually just one occurrence.  She reports that her headache is worse today than her typical headache.  Headache located on the right side of her face.  She reports that she had a severe headache yesterday that was posterior.  She denies any head injury or trauma since her injury in January.  She reports that she has sen her PCP for the recurrent nausea and vomiting.  She states that her PCP has ordered abdominal CT scans, which she reports were normal.  She also reports that she has recently been diagnosed with a UTI and is on Day 12 of a 14 day cycle of Macrobid.  Review of the chart shows a displaced right zygomatic fracture with disruption of the orbital floor causing an infraorbital nerve injury and impingement of zygomatic arch of temporal muscle after her fall on 07/04/11.  ORIF of the fracture was done by Dr. Lazarus Salines on 07/13/11.   Patient is a 78 y.o. female presenting with vomiting and headaches. The history is provided by the patient.  Emesis  Episode frequency: 1-2 times/day. The problem has not changed since onset.There has been no fever. Associated symptoms include headaches. Pertinent negatives include no abdominal pain, no chills, no diarrhea and no fever.  Headache  Associated symptoms include nausea and vomiting. Pertinent negatives include no fever and no shortness of breath.    Past Medical History  Diagnosis Date  . Hypertension   . Hyperlipemia   . Fibromyalgia   . Anxiety   . Dysrhythmia       RX  . Blood transfusion   . Chronic kidney disease     STONES  . GERD (gastroesophageal reflux disease)   . Headache   . Arthritis     Past Surgical History  Procedure Date  . Abdominal hysterectomy   . Hand surgery     BIL   . Shoulder arthroscopy w/ rotator cuff repair     LFT  . Cervical disc surgery   . Back surgery     X2  . Orif tripod fracture 07/13/2011    Procedure: OPEN REDUCTION INTERNAL FIXATION (ORIF) TRIPOD FRACTURE;  Surgeon: [name], MD;  Location: [facility] OR;  Service: ENT;  Laterality: Right;  ORIF RIGHT ZYGOMA, ORBITAL FLOOR EXPLORATION WITH FROST STITCH (TEMPORARY TARSORRHAPHY)    No family history on file.  History  Substance Use Topics  . Smoking status: Never Smoker   . Smokeless tobacco: Not on file  . Alcohol Use: No    OB History    Grav Para Term Preterm Abortions TAB SAB Ect Mult Living                  Review of Systems  Constitutional: Negative for fever, chills and diaphoresis.  HENT: Negative for neck pain and neck stiffness.   Respiratory: Negative for shortness of breath.   Cardiovascular: Negative for chest pain.  Gastrointestinal: Positive for nausea and vomiting. Negative for abdominal pain, diarrhea, constipation, blood in stool and  abdominal distention.  Genitourinary: Positive for dysuria. Negative for hematuria.  Musculoskeletal: Negative for back pain and gait problem.  Neurological: Positive for headaches. Negative for dizziness, syncope, facial asymmetry, weakness, light-headedness and numbness.  Psychiatric/Behavioral: Negative for confusion.    Allergies  Latex; Chlorhexidine gluconate; Aspirin; Codeine; Betadine; and Caffeine  Home Medications   Current Outpatient Rx  Name Route Sig Dispense Refill  . ALPRAZOLAM 1 MG PO TABS Oral Take 0.5-1 mg by mouth 3 (three) times daily as needed. For anxiety    . ATENOLOL 100 MG PO TABS Oral Take 100 mg by mouth every morning.     Marland Kitchen CALCIUM 600 + D PO Oral Take 1  tablet by mouth daily.    Marland Kitchen VITAMIN D 1000 UNITS PO TABS Oral Take 2,000 Units by mouth daily.    . OMEGA-3 FATTY ACIDS 1000 MG PO CAPS Oral Take 1 g by mouth 3 (three) times daily.    Marland Kitchen FLAX SEED OIL 1300 MG PO CAPS Oral Take 1 capsule by mouth 2 (two) times daily.    Marland Kitchen HYOSCYAMINE SULFATE 0.125 MG SL SUBL Sublingual Place 0.125 mg under the tongue every 4 (four) hours as needed.    Marland Kitchen LOSARTAN POTASSIUM 100 MG PO TABS Oral Take 100 mg by mouth every evening.    Marland Kitchen MAGNESIUM 250 MG PO TABS Oral Take 250 mg by mouth 3 (three) times daily.    Carma Leaven M PLUS PO TABS Oral Take 1 tablet by mouth daily.    Marland Kitchen OMEPRAZOLE MAGNESIUM 20 MG PO TBEC Oral Take 20 mg by mouth daily.    Marland Kitchen ONDANSETRON HCL 4 MG/5ML PO SOLN Intravenous Inject 4 mg into the vein once.    Marland Kitchen OVER THE COUNTER MEDICATION Oral Take 1 capsule by mouth daily. Pt. Takes a over the counter stool softner    . PRAVASTATIN SODIUM 40 MG PO TABS Oral Take 40 mg by mouth daily.    Marland Kitchen PHILLIPS COLON HEALTH PO Oral Take 1 tablet by mouth daily.    . TRAMADOL HCL 50 MG PO TABS Oral Take 50 mg by mouth every 6 (six) hours as needed. For pain      BP 171/80  Pulse 65  Temp(Src) 97.8 F (36.6 C) (Oral)  Resp 18  SpO2 100%  Physical Exam  Nursing note and vitals reviewed. Constitutional: She is oriented to person, place, and time. She appears well-developed and well-nourished. She appears distressed.       Anxious appearing  HENT:  Head: Normocephalic and atraumatic.  Mouth/Throat: Oropharynx is clear and moist.  Eyes: Pupils are equal, round, and reactive to light.       Patient has some delay with right lateral gaze  Neck: Normal range of motion. Neck supple.  Cardiovascular: Normal rate, regular rhythm and normal heart sounds.   Pulmonary/Chest: Effort normal and breath sounds normal.  Abdominal: Soft. Bowel sounds are normal. She exhibits no distension and no mass. There is tenderness in the suprapubic area. There is no rebound and no  guarding.  Musculoskeletal: Normal range of motion.  Neurological: She is alert and oriented to person, place, and time. She has normal strength and normal reflexes. No cranial nerve deficit. Gait normal.       Patient reports that her sensation is decreased on the right side of her face.  Patient reports unchanged from her head injury in January. CN otherwise intact.  Skin: Skin is warm and dry. No rash noted. She is not diaphoretic.  Psychiatric: She has a normal mood and affect.    ED Course  Procedures (including critical care time)  Labs Reviewed - No data to display No results found.   No diagnosis found.  Patient reports that her headache and nausea have improved, but are still present.  She reports that it is the best that it has been since her head injury in January.    Patient discussed with Dr. Lynelle Doctor who also evaluated patient.  Patient signed out to Marlon Pel, PA-C.  Patient currently awaiting MRI brain.    MDM  Patient with chronic nausea and vomiting and also headache.  CT head showed an infarction that was new from the CT head back in January done at the time of the fall, however, is not acute.  Radiologist believes that the infarct appears to be several weeks ago.  MRI has been ordered for further evaluation.   Labwork unremarkable.          Pascal Lux [place], PA-C 09/02/11 1657

## 2011-09-01 NOTE — ED Notes (Signed)
Patient states she fell in January and had a metal plate surgically placed in her head and since then she has had nausea and vomiting. Patient denies chest pain or SOB.

## 2011-09-04 ENCOUNTER — Encounter: Payer: Self-pay | Admitting: Gastroenterology

## 2011-09-04 NOTE — ED Provider Notes (Signed)
See prior note   Ward Givens, MD 09/04/11 [phone]

## 2011-09-17 ENCOUNTER — Encounter: Payer: Self-pay | Admitting: Gastroenterology

## 2011-09-20 ENCOUNTER — Ambulatory Visit (INDEPENDENT_AMBULATORY_CARE_PROVIDER_SITE_OTHER): Payer: Medicare Other | Admitting: Gastroenterology

## 2011-09-20 ENCOUNTER — Encounter: Payer: Self-pay | Admitting: Gastroenterology

## 2011-09-20 VITALS — BP 136/70 | HR 60 | Ht 64.0 in | Wt 120.0 lb

## 2011-09-20 DIAGNOSIS — Z8719 Personal history of other diseases of the digestive system: Secondary | ICD-10-CM

## 2011-09-20 DIAGNOSIS — T50904A Poisoning by unspecified drugs, medicaments and biological substances, undetermined, initial encounter: Secondary | ICD-10-CM

## 2011-09-20 DIAGNOSIS — R110 Nausea: Secondary | ICD-10-CM

## 2011-09-20 DIAGNOSIS — G44329 Chronic post-traumatic headache, not intractable: Secondary | ICD-10-CM | POA: Insufficient documentation

## 2011-09-20 DIAGNOSIS — R634 Abnormal weight loss: Secondary | ICD-10-CM

## 2011-09-20 MED ORDER — CILIDINIUM-CHLORDIAZEPOXIDE 2.5-5 MG PO CAPS: 1.0000 | ORAL_CAPSULE | Freq: Two times a day (BID) | ORAL | Status: DC

## 2011-09-20 MED ORDER — FIRST-DUKES MOUTHWASH MT SUSP: OROMUCOSAL | Status: DC

## 2011-09-20 MED ORDER — METOCLOPRAMIDE HCL 10 MG PO TABS: 10.0000 mg | ORAL_TABLET | Freq: Every day | ORAL | Status: DC

## 2011-09-20 MED ORDER — SUCRALFATE 1 GM/10ML PO SUSP: ORAL | Status: DC

## 2011-09-20 NOTE — Progress Notes (Signed)
History of Present Illness:  This is a very nice 78 year old Caucasian female for by Dr. Oneta Rack for evaluation of several weeks of nausea and thick phlegm, throat clearing, mild anorexia and weight loss, all improved with reinstitution of Nexium 40 mg a day and probiotics. She denies true reflux symptoms, dysphagia, painful swallowing, significant abdominal or chest pain. She has been on frequent antibiotics over the last month because of a facial injury with resultant staph infection. She has multiple records, labs, and radiographs for review. Throughout her interview and exam her husband was present.  Review of labs shows no specific abnormalities. CT scan of the head and ultrasound the abdomen were reviewed and are unremarkable. She alleges that she had endoscopy and colonoscopy several years ago, but these records are not available for review. He has been on when necessary Nexium for acid reflux symptoms since that time.  There is no history of pancreatitis, hepatitis, diarrhea, melena or hematochezia or any specific hepatobiliary complaints. She was prescribed when necessary Levsin which she does not use, and she is not currently on tramadol but uses when necessary Tylenol for facial pain. Patient does complain of abdominal gas and bloating, but not excessive flatus. There is no history of previous abdominal or chest surgery. He specifically denies use of alcohol, cigarettes, or NSAIDs. One of her main problem today is intense a.m. of nausea. She has had no diarrhea or lower abdominal symptoms.  I have reviewed this patient's present history, medical and surgical past history, allergies and medications.     ROS: The remainder of the 10 point ROS is negative.... denies recurrent genitourinary, other neurologic, cardiovascular pulmonary difficulties. She does continue with mild nonspecific headaches, a long history of chronic anxiety with Xanax use, degenerative arthritis with low back pain, apparently  the past has had lumbar laminectomy. She previously was on oxycodone which she apparently is not using at this time. Family history is noncontributory for colon cancer or other intestinal problems.     Physical Exam: Blood pressure 136/70, pulse 60 and regular, and weight 120 pounds. Eminase oropharynx is unremarkable without evidence of Candida or other mucosal lesions. General well developed well nourished patient in no acute distress, appearing their her age Eyes PERRLA, no icterus, fundoscopic exam per opthamologist Skin no lesions noted Neck supple, no adenopathy, no thyroid enlargement, no tenderness Chest clear to percussion and auscultation Heart no significant murmurs, gallops or rubs noted Abdomen no hepatosplenomegaly masses or tenderness, BS very active but nonobstructive in quality. I cannot appreciate a succussion splash..  Extremities no acute joint lesions, edema, phlebitis or evidence of cellulitis. Neurologic patient oriented x 3, cranial nerves intact, no focal neurologic deficits noted. Psychological mental status normal and normal affect.  Assessment and plan: Patient with a long history of" nervous stomach" with upper gastrointestinal dysfunction related to recent antibiotic use. I have scheduled her for endoscopy to exclude Candida esophagitis, H. pylori infection, or activated peptic ulcer disease. I placed her on Dukes Magic mouthwash 4 times a day, continue Nexium 40 mg a day, and I prescribed Reglan 10 mg at bedtime with Carafate 1 g at bedtime and twice a day if needed. Also prescribed Librax every  6-8 hours as needed. She will neesd followup colonoscopy at some point. Encounter Diagnosis  Name Primary?  . Nausea Yes

## 2011-09-20 NOTE — Patient Instructions (Signed)
Your procedure has been scheduled for 09/24/2011, please follow the seperate instructions.  Your prescription(s) have been sent to you pharmacy Librax, Carafate, Dukes Mouthwash, Reglan. Continue your Nexium. Use Librax twice a day as needed.  Use Carafate at bedtime and in the morning if you feel it is helping.  Use Dukes mouthwash four times a day.  Take Reglan at bedtime.

## 2011-09-24 ENCOUNTER — Telehealth: Payer: Self-pay | Admitting: Gastroenterology

## 2011-09-24 ENCOUNTER — Ambulatory Visit (AMBULATORY_SURGERY_CENTER): Payer: Medicare Other | Admitting: Gastroenterology

## 2011-09-24 ENCOUNTER — Encounter: Payer: Self-pay | Admitting: Gastroenterology

## 2011-09-24 VITALS — BP 156/74 | HR 65 | Temp 97.6°F | Resp 23 | Ht 64.0 in | Wt 120.0 lb

## 2011-09-24 DIAGNOSIS — K589 Irritable bowel syndrome without diarrhea: Secondary | ICD-10-CM

## 2011-09-24 DIAGNOSIS — B3781 Candidal esophagitis: Secondary | ICD-10-CM

## 2011-09-24 DIAGNOSIS — R110 Nausea: Secondary | ICD-10-CM

## 2011-09-24 DIAGNOSIS — Z8719 Personal history of other diseases of the digestive system: Secondary | ICD-10-CM

## 2011-09-24 DIAGNOSIS — D1330 Benign neoplasm of unspecified part of small intestine: Secondary | ICD-10-CM

## 2011-09-24 MED ORDER — SODIUM CHLORIDE 0.9 % IV SOLN: 500.0000 mL | INTRAVENOUS | Status: DC

## 2011-09-24 NOTE — Patient Instructions (Addendum)
YOU HAD AN ENDOSCOPIC PROCEDURE TODAY AT THE [facility]: Refer to the procedure report that was given to you for any specific questions about what was found during the examination.  If the procedure report does not answer your questions, please call your gastroenterologist to clarify.  If you requested that your care partner not be given the details of your procedure findings, then the procedure report has been included in a sealed envelope for you to review at your convenience later.  YOU SHOULD EXPECT: Some feelings of bloating in the abdomen. Passage of more gas than usual.  Walking can help get rid of the air that was put into your GI tract during the procedure and reduce the bloating. If you had a lower endoscopy (such as a colonoscopy or flexible sigmoidoscopy) you may notice spotting of blood in your stool or on the toilet paper. If you underwent a bowel prep for your procedure, then you may not have a normal bowel movement for a few days.  DIET: Your first meal following the procedure should be a light meal and then it is ok to progress to your normal diet.  A half-sandwich or bowl of soup is an example of a good first meal.  Heavy or fried foods are harder to digest and may make you feel nauseous or bloated.  Likewise meals heavy in dairy and vegetables can cause extra gas to form and this can also increase the bloating.  Drink plenty of fluids but you should avoid alcoholic beverages for 24 hours.  ACTIVITY: Your care partner should take you home directly after the procedure.  You should plan to take it easy, moving slowly for the rest of the day.  You can resume normal activity the day after the procedure however you should NOT DRIVE or use heavy machinery for 24 hours (because of the sedation medicines used during the test).    SYMPTOMS TO REPORT IMMEDIATELY: A gastroenterologist can be reached at any hour.  During normal business hours, 8:30 AM to 5:00 PM Monday through Friday,  call [phone].  After hours and on weekends, please call the GI answering service at [phone] who will take a message and have the physician on call contact you.  Following upper endoscopy (EGD)  Vomiting of blood or coffee ground material  New chest pain or pain under the shoulder blades  Painful or persistently difficult swallowing  New shortness of breath  Fever of 100F or higher  Black, tarry-looking stools  FOLLOW UP: If any biopsies were taken you will be contacted by phone or by letter within the next 1-3 weeks.  Call your gastroenterologist if you have not heard about the biopsies in 3 weeks.  Our staff will call the home number listed on your records the next business day following your procedure to check on you and address any questions or concerns that you may have at that time regarding the information given to you following your procedure. This is a courtesy call and so if there is no answer at the home number and we have not heard from you through the emergency physician on call, we will assume that you have returned to your regular daily activities without incident.  SIGNATURES/CONFIDENTIALITY: You and/or your care partner have signed paperwork which will be entered into your electronic medical record.  These signatures attest to the fact that that the information above on your After Visit Summary has been reviewed and is understood.  Full responsibility of   the confidentiality of this discharge information lies with you and/or your care-partner. 

## 2011-09-24 NOTE — Op Note (Addendum)
[facility] [address] [place], [place]  [address]  ENDOSCOPY PROCEDURE REPORT  PATIENT:  Debra White, Debra White  MR#:  [id number] BIRTHDATE:  13-Sep-1933, 77 yrs. old  GENDER:  female  ENDOSCOPIST:  Vania Rea. Jarold Motto, MD, [facility] Referred by:  Toniann Fail, M.D. Lucky Cowboy, M.D.  PROCEDURE DATE:  09/24/2011 PROCEDURE:  EGD with biopsy, 43239, EGD with biopsy for H. pylori 19147 ASA CLASS:  Class III INDICATIONS:  abdominal pain, GERD, nausea  MEDICATIONS:   propofol (Diprivan) 130 mg IV TOPICAL ANESTHETIC:  DESCRIPTION OF PROCEDURE:   After the risks and benefits of the procedure were explained, informed consent was obtained.  The LB GIF-H180 [id number] endoscope was introduced through the mouth and advanced to the second portion of the duodenum.  The instrument was slowly withdrawn as the mucosa was fully examined. <<PROCEDUREIMAGES>>  Candida esophagitis in the proximal esophagus. SEE PICTURES.DESPITE RX A FEW PLAQUES PERSIST.  nl esophagus. NO EROSIONS OR STRICTURE NOTED.  Normal GE junction was noted.  The stomach was entered and closely examined. The antrum, angularis, and lesser curvature were well visualized, including a retroflexed view of the cardia and fundus. The stomach wall was normally distensable. The scope passed easily through the pylorus into the duodenum. CLO BX. DONE  Normal duodenal folds were noted. SI BIOPSIES DONE    Retroflexed views revealed no abnormalities.  SEE PICTURES.  The scope was then withdrawn from the patient and the procedure completed.  COMPLICATIONS:  None  ENDOSCOPIC IMPRESSION: 1) Candida esophagitis in the proximal esophagus 2) Nl esophagus 3) Normal GE junction 4) Normal stomach 5) Normal duodenal folds 1.RESOLVING CANDIDA ESOPHAGITIS RELATED TO ANTIBIOTIC USE 2.FUNCTIONAL DYSPEPSIA.BIOPSIES DONE FOR H.PYLORI AND TO R/O CELIAC DISEASE. RECOMMENDATIONS: 1.FINISH ORAL MYCOSTATIN 2.AWAIT BIOPSIES 3.PRN LIBRAX AS  TOLERATED 4.STOP REGLAN AND CONTINUE NEXIUM FOR HER NONEROSIVE GERD  ______________________________ Vania Rea. Jarold Motto, MD, [facility]  CC:  n. REVISED:  09/24/2011 03:58 PM eSIGNED:   Vania Rea. Siler Mavis at 09/24/2011 03:58 PM  Leandrew Koyanagi, [phone]

## 2011-09-24 NOTE — Telephone Encounter (Signed)
Spoke with patient. MD called and notified her that she has UTI. Told her to take magic mouthwash as directed.

## 2011-09-24 NOTE — Progress Notes (Signed)
Patient did not experience any of the following events: a burn prior to discharge; a fall within the facility; wrong site/side/patient/procedure/implant event; or a hospital transfer or hospital admission upon discharge from the facility. (G8907) Patient did not have preoperative order for IV antibiotic SSI prophylaxis. (G8918)  

## 2011-09-25 ENCOUNTER — Telehealth: Payer: Self-pay

## 2011-09-25 NOTE — Telephone Encounter (Signed)
  Follow up Call-  Call back number 09/24/2011  Post procedure Call Back phone  # [phone]  Permission to leave phone message Yes     Patient questions:  Do you have a fever, pain , or abdominal swelling? no Pain Score  0 *  Have you tolerated food without any problems? yes  Have you been able to return to your normal activities? yes  Do you have any questions about your discharge instructions: Diet   no Medications  no Follow up visit  no  Do you have questions or concerns about your Care? no  Actions: * If pain score is 4 or above: No action needed, pain <4.  Per the pt she is having bloating and the same abdominal pain she was having pre-procedure.I advised the pt to follow the orders that Dr. Jarold Motto advised on his procedure report.  Finish oral mycostatin, await bx, prn librax as tolerated. And stop reglan and continue nexium for her nonerosive GERD.  She asked what number to call if she needed to see Dr. Jarold Motto.  I gave her the 3rd floor number.  Maw

## 2011-09-26 ENCOUNTER — Encounter (HOSPITAL_COMMUNITY): Payer: Self-pay | Admitting: *Deleted

## 2011-09-26 ENCOUNTER — Telehealth: Payer: Self-pay | Admitting: Gastroenterology

## 2011-09-26 ENCOUNTER — Emergency Department (HOSPITAL_COMMUNITY)
Admission: EM | Admit: 2011-09-26 | Discharge: 2011-09-26 | Disposition: A | Discharge status: Home / Self Care | Payer: Medicare Other | Attending: Emergency Medicine | Admitting: Emergency Medicine

## 2011-09-26 ENCOUNTER — Encounter: Payer: Self-pay | Admitting: Gastroenterology

## 2011-09-26 DIAGNOSIS — E871 Hypo-osmolality and hyponatremia: Secondary | ICD-10-CM

## 2011-09-26 DIAGNOSIS — F411 Generalized anxiety disorder: Secondary | ICD-10-CM | POA: Insufficient documentation

## 2011-09-26 DIAGNOSIS — IMO0001 Reserved for inherently not codable concepts without codable children: Secondary | ICD-10-CM | POA: Insufficient documentation

## 2011-09-26 DIAGNOSIS — I129 Hypertensive chronic kidney disease with stage 1 through stage 4 chronic kidney disease, or unspecified chronic kidney disease: Secondary | ICD-10-CM | POA: Insufficient documentation

## 2011-09-26 DIAGNOSIS — K219 Gastro-esophageal reflux disease without esophagitis: Secondary | ICD-10-CM | POA: Insufficient documentation

## 2011-09-26 DIAGNOSIS — R5381 Other malaise: Secondary | ICD-10-CM | POA: Insufficient documentation

## 2011-09-26 DIAGNOSIS — Z79899 Other long term (current) drug therapy: Secondary | ICD-10-CM | POA: Insufficient documentation

## 2011-09-26 DIAGNOSIS — R5383 Other fatigue: Secondary | ICD-10-CM | POA: Insufficient documentation

## 2011-09-26 DIAGNOSIS — R1110 Vomiting, unspecified: Secondary | ICD-10-CM | POA: Insufficient documentation

## 2011-09-26 DIAGNOSIS — R110 Nausea: Secondary | ICD-10-CM | POA: Insufficient documentation

## 2011-09-26 DIAGNOSIS — N189 Chronic kidney disease, unspecified: Secondary | ICD-10-CM | POA: Insufficient documentation

## 2011-09-26 LAB — CBC
Hemoglobin: 12.3 g/dL (ref 12.0–15.0)
MCH: 31.1 pg (ref 26.0–34.0)
MCV: 88.9 fL (ref 78.0–100.0)
Platelets: 270 10*3/uL (ref 150–400)
RBC: 3.96 MIL/uL (ref 3.87–5.11)
WBC: 8.6 10*3/uL (ref 4.0–10.5)

## 2011-09-26 LAB — COMPREHENSIVE METABOLIC PANEL
ALT: 15 U/L (ref 0–35)
AST: 16 U/L (ref 0–37)
CO2: 26 mEq/L (ref 19–32)
Calcium: 9.4 mg/dL (ref 8.4–10.5)
Chloride: 93 mEq/L — ABNORMAL LOW (ref 96–112)
Creatinine, Ser: 0.74 mg/dL (ref 0.50–1.10)
GFR calc Af Amer: >90 mL/min (ref >90)
GFR calc non Af Amer: 81 mL/min — ABNORMAL LOW (ref >90)
Glucose, Bld: 105 mg/dL — ABNORMAL HIGH (ref 70–99)
Total Bilirubin: 0.5 mg/dL (ref 0.3–1.2)

## 2011-09-26 LAB — URINALYSIS, ROUTINE W REFLEX MICROSCOPIC
Glucose, UA: NEGATIVE mg/dL
Hgb urine dipstick: NEGATIVE
Ketones, ur: NEGATIVE mg/dL
Protein, ur: NEGATIVE mg/dL
Urobilinogen, UA: 0.2 mg/dL (ref 0.0–1.0)

## 2011-09-26 MED ORDER — SODIUM CHLORIDE 0.9 % IV BOLUS (SEPSIS)
500.0000 mL | Freq: Once | INTRAVENOUS | Status: AC
  Administered 2011-09-26: 500 mL via INTRAVENOUS

## 2011-09-26 MED ORDER — OXYCODONE-ACETAMINOPHEN 5-325 MG PO TABS
1.0000 | ORAL_TABLET | Freq: Once | ORAL | Status: DC
  Filled 2011-09-26: qty 1

## 2011-09-26 MED ORDER — MORPHINE SULFATE 2 MG/ML IJ SOLN
2.0000 mg | Freq: Once | INTRAMUSCULAR | Status: AC
  Administered 2011-09-26: 2 mg via INTRAVENOUS
  Filled 2011-09-26: qty 1

## 2011-09-26 MED ORDER — ONDANSETRON HCL 4 MG PO TABS: 4.0000 mg | ORAL_TABLET | Freq: Four times a day (QID) | ORAL | Status: AC

## 2011-09-26 MED ORDER — LORAZEPAM 2 MG/ML IJ SOLN
1.0000 mg | Freq: Once | INTRAMUSCULAR | Status: AC
  Administered 2011-09-26: 1 mg via INTRAVENOUS
  Filled 2011-09-26: qty 1

## 2011-09-26 MED ORDER — HYDROMORPHONE HCL PF 1 MG/ML IJ SOLN
INTRAMUSCULAR | Status: AC
  Filled 2011-09-26: qty 1

## 2011-09-26 MED ORDER — ONDANSETRON HCL 4 MG/2ML IJ SOLN
4.0000 mg | Freq: Once | INTRAMUSCULAR | Status: AC
  Administered 2011-09-26: 4 mg via INTRAVENOUS
  Filled 2011-09-26: qty 2

## 2011-09-26 NOTE — Discharge Instructions (Signed)
Return to the ED with any concerns including vomiting and not able to keep down liquids or your antibiotics, fainting, fever/chills, decreased level of alertness/lethargy, or any other alarming symptoms

## 2011-09-26 NOTE — ED Provider Notes (Signed)
History     CSN: [id number]  Arrival date & time 09/26/11  1215   First MD Initiated Contact with Patient 09/26/11 1220      Chief Complaint  Patient presents with  . Abdominal Pain  . Nausea  . Emesis  . Extremity Weakness    (Consider location/radiation/quality/duration/timing/severity/associated sxs/prior treatment) HPI Pt presents with c/o nausea, generalized fatigue and weakness.  Pt states she had a facial fracture requiring surgical repair several weeks ago and has been on antibiotics, she also states she had an EGD yesterday and was diagnosed with candidal esophagitis- meds for this as well as a UTI.  Pt states she believes the antibiotics are causing her nausea.  She specifically denies vomiting up her medications.  States she feels she needs to spit frequently- showed me during my eval what she means and she is spitting saliva out of her mouth.  No fever/chills, no cough.  Pt states she also feels generally weak- no focal weakness or syncope.  She alsso states she has anxiety and feels that it is increased due to her recent health problems.  There are no other associated systemic symptoms, there are no alleviating or modifying factors.   Past Medical History  Diagnosis Date  . Hypertension   . Hyperlipemia   . Fibromyalgia   . Anxiety   . Dysrhythmia     RX  . Blood transfusion   . Chronic kidney disease     STONES  . GERD (gastroesophageal reflux disease)   . Headache   . Arthritis   . Cataract     Past Surgical History  Procedure Date  . Abdominal hysterectomy   . Hand surgery     BIL   . Shoulder arthroscopy w/ rotator cuff repair     LFT  . Cervical disc surgery   . Back surgery     X2  . Orif tripod fracture 07/13/2011    Procedure: OPEN REDUCTION INTERNAL FIXATION (ORIF) TRIPOD FRACTURE;  Surgeon: [name], MD;  Location: [facility] OR;  Service: ENT;  Laterality: Right;  ORIF RIGHT ZYGOMA, ORBITAL FLOOR EXPLORATION WITH FROST STITCH (TEMPORARY  TARSORRHAPHY)  . Colonoscopy   . Facial fracture surgery     Family History  Problem Relation Age of Onset  . Heart disease Mother   . Breast cancer Sister   . Colon cancer Sister     History  Substance Use Topics  . Smoking status: Never Smoker   . Smokeless tobacco: Never Used  . Alcohol Use: No    OB History    Grav Para Term Preterm Abortions TAB SAB Ect Mult Living                  Review of Systems ROS reviewed and all otherwise negative except for mentioned in HPI  Allergies  Latex; Chlorhexidine gluconate; Aspirin; Codeine; Prochlorperazine; Betadine; and Caffeine  Home Medications   Current Outpatient Rx  Name Route Sig Dispense Refill  . ACETAMINOPHEN 500 MG PO TABS Oral Take 1,000 mg by mouth every 6 (six) hours as needed. For pain    . ALPRAZOLAM 1 MG PO TABS Oral Take 0.5-1 mg by mouth 3 (three) times daily as needed. For anxiety    . ATENOLOL 100 MG PO TABS Oral Take 100 mg by mouth every morning.     Marland Kitchen CALCIUM 600 + D PO Oral Take 1 tablet by mouth daily.    Marland Kitchen VITAMIN D 1000 UNITS PO TABS Oral Take 2,000  Units by mouth daily.    Marland Kitchen CIPROFLOXACIN HCL 500 MG PO TABS Oral Take 250 mg by mouth 2 (two) times daily.    Marland Kitchen CLINDINIUM-CHLORDIAZEPOXIDE 2.5-5 MG PO CAPS Oral Take 1 capsule by mouth 2 (two) times daily. 60 capsule 3  . FIRST-DUKES MOUTHWASH MT SUSP  Swish and swallow 5 mL four times a day 800 mL 1  . ESOMEPRAZOLE MAGNESIUM 40 MG PO CPDR Oral Take 40 mg by mouth daily before breakfast.    . OMEGA-3 FATTY ACIDS 1000 MG PO CAPS Oral Take 1 g by mouth 3 (three) times daily.    Marland Kitchen FLAX SEED OIL 1300 MG PO CAPS Oral Take 1 capsule by mouth 2 (two) times daily.    Marland Kitchen HYOSCYAMINE SULFATE 0.125 MG SL SUBL Sublingual Place 0.125 mg under the tongue every 4 (four) hours as needed. For abdominal cramping    . LOSARTAN POTASSIUM 100 MG PO TABS Oral Take 100 mg by mouth every evening.    Marland Kitchen MAGNESIUM 250 MG PO TABS Oral Take 250 mg by mouth 3 (three) times daily.      Carma Leaven M PLUS PO TABS Oral Take 1 tablet by mouth daily.    Marland Kitchen PRAVASTATIN SODIUM 40 MG PO TABS Oral Take 40 mg by mouth daily.    Marland Kitchen ONDANSETRON HCL 4 MG PO TABS Oral Take 1 tablet (4 mg total) by mouth every 6 (six) hours. 12 tablet 0    BP 127/55  Pulse 64  Temp(Src) 98.2 F (36.8 C) (Oral)  Resp 23  SpO2 100% Vitals reviewed Physical Exam  Physical Examination: General appearance - alert, anxious appearing, and in no distress Mental status - alert, oriented to person, place, and time Eyes - pupils equal and reactive, no scleral icterus or conjunctival injection Mouth - mucous membranes moist, pharynx normal without lesions Chest - clear to auscultation, no wheezes, rales or rhonchi, symmetric air entry Heart - normal rate, regular rhythm, normal S1, S2, no murmurs, rubs, clicks or gallops Abdomen - soft, nontender, nondistended, no masses or organomegaly, nabs Neurological - alert, oriented, normal speech, cranial nerves 2-12 tested and intact, strength 5/5 in extremities x 4, sensation intact Musculoskeletal - no joint tenderness, deformity or swelling Extremities - peripheral pulses normal, no pedal edema, no clubbing or cyanosis Skin - normal coloration and turgor, no rashes  ED Course  Procedures (including critical care time)  Labs Reviewed  CBC - Abnormal; Notable for the following:    HCT 35.2 (*)    All other components within normal limits  COMPREHENSIVE METABOLIC PANEL - Abnormal; Notable for the following:    Sodium 129 (*)    Chloride 93 (*)    Glucose, Bld 105 (*)    GFR calc non Af Amer 81 (*)    All other components within normal limits  LIPASE, BLOOD  URINALYSIS, ROUTINE W REFLEX MICROSCOPIC  LAB REPORT - SCANNED   No results found.   1. Nausea   2. Hyponatremia       MDM  Pt presents with generalized weakness and nausea.   Recently diagnosed with candidal esophagitis, UTI.  Labs reassuring in ED with the exception of hyponatremia.  Pt able to  keep down fluids and medications by mouth.  Pt with anxiety as well.  Pt was discharged with meds for nausea and to continue other medical treatments.  She was given strict return precautions.  Pt agreeable with plan and will arrange for follow up with her primary doctor.  Ethelda Chick, MD 09/28/11 [phone]

## 2011-09-26 NOTE — ED Notes (Signed)
Pt reports that she has been "treated with antibiotics for a fungal infection in her esophagus by [facility]" Now c/o pain as well as nausea/vomiting.  She reports that she is not tolerating her antibiotics well.

## 2011-09-26 NOTE — ED Notes (Signed)
Family at the [facility] with pt.  The pt continues to report that "her nerves are all worked up and, aren't you going to  give me more medicine in my IV for pain and nerves."  Explained to the pt that she had been given all ordered medication in the ER. Pt continues to be very anxious.

## 2011-09-26 NOTE — ED Notes (Addendum)
EMS-pt called EMS for right arm weakness. Grips equal bilaterally. Stroke scale is negative. Pt has hx of plate placed in head after falling. Pt reports intermittent headaches to right side of head since surgery in jan. Pt reports intermittent n/v/abd pain x several months, pt has been treated for the same with antibiotics. Pt also reports chills at night. 20g(R)AC. 4mg  of zofran given en route. CBG 132.

## 2011-09-26 NOTE — Telephone Encounter (Signed)
Pt reports being sick on her stomach and feeling like she's going to pass out. She states she vomited and she feels better. She also reports a headache. Pt had EGD on 09/24/11 with resolving candida esophagitis r/t antibiotic use; paths aren't back. Pt was to complete th mycostatin, use Librax prn, stop Reglan and continue GERD. Pt reports she was told not to take the Librax with her Xanax so she hasn't tried it. She was placed on Cipro in the evening of 09/24/11 for a UTI. I questioned her on whether the Cipro was making her sick and then she reports this problem is nothing new; she's had the nausea since January, 2013 when she fell and fx her face and had surgery. She states she; so weak she can't go on and feels dehydrated. Pt advised to call Dr Kathryne Sharper ofc or go to the ER d/t her weakness; pt stated understanding.

## 2011-09-28 ENCOUNTER — Encounter: Payer: Self-pay | Admitting: Gastroenterology

## 2011-11-19 ENCOUNTER — Encounter (HOSPITAL_COMMUNITY): Payer: Self-pay | Admitting: *Deleted

## 2011-11-19 ENCOUNTER — Emergency Department (HOSPITAL_COMMUNITY)
Admission: EM | Admit: 2011-11-19 | Discharge: 2011-11-19 | Disposition: A | Discharge status: Home / Self Care | Payer: Medicare Other | Attending: Emergency Medicine | Admitting: Emergency Medicine

## 2011-11-19 DIAGNOSIS — R109 Unspecified abdominal pain: Secondary | ICD-10-CM | POA: Insufficient documentation

## 2011-11-19 DIAGNOSIS — E785 Hyperlipidemia, unspecified: Secondary | ICD-10-CM | POA: Insufficient documentation

## 2011-11-19 DIAGNOSIS — K219 Gastro-esophageal reflux disease without esophagitis: Secondary | ICD-10-CM | POA: Insufficient documentation

## 2011-11-19 DIAGNOSIS — I10 Essential (primary) hypertension: Secondary | ICD-10-CM | POA: Insufficient documentation

## 2011-11-19 DIAGNOSIS — R300 Dysuria: Secondary | ICD-10-CM | POA: Insufficient documentation

## 2011-11-19 DIAGNOSIS — R1013 Epigastric pain: Secondary | ICD-10-CM | POA: Insufficient documentation

## 2011-11-19 LAB — URINALYSIS, ROUTINE W REFLEX MICROSCOPIC
Bilirubin Urine: NEGATIVE
Glucose, UA: NEGATIVE mg/dL
Ketones, ur: NEGATIVE mg/dL
Protein, ur: NEGATIVE mg/dL
pH: 7.5 (ref 5.0–8.0)

## 2011-11-19 MED ORDER — CIPROFLOXACIN HCL 500 MG PO TABS: 500.0000 mg | ORAL_TABLET | Freq: Two times a day (BID) | ORAL | Status: DC

## 2011-11-19 MED ORDER — FLUCONAZOLE 150 MG PO TABS: 150.0000 mg | ORAL_TABLET | Freq: Once | ORAL | Status: AC

## 2011-11-19 MED ORDER — FLUCONAZOLE 150 MG PO TABS: 150.0000 mg | ORAL_TABLET | Freq: Once | ORAL | Status: DC

## 2011-11-19 MED ORDER — AMOXICILLIN-POT CLAVULANATE 500-125 MG PO TABS: 1.0000 | ORAL_TABLET | Freq: Two times a day (BID) | ORAL | Status: AC

## 2011-11-19 MED ORDER — METRONIDAZOLE 500 MG PO TABS: 500.0000 mg | ORAL_TABLET | Freq: Three times a day (TID) | ORAL | Status: DC

## 2011-11-19 NOTE — ED Provider Notes (Signed)
History     CSN: [id number]  Arrival date & time 11/19/11  8119   First MD Initiated Contact with Patient 11/19/11 [phone]      Chief Complaint  Patient presents with  . Dysuria    (Consider location/radiation/quality/duration/timing/severity/associated sxs/prior treatment) HPI Comments: Patient reports that she has been having dysuria over the past 3 days.  She called her PCP two days ago and he recommended the patient start a topical antifungal cream to treat a yeast infection.  Patient has been recently on antibiotics to treat an UTI. She reports that she was on Cipro.  She has been using this, but does not feel that it is helping.   Patient also began having abdominal pain last evening.  She reports that the pain feels like a burning pain.  The pain shoots across her abdomen and also radiates from the suprapubic area to the epigastric area.  She took a Tramadol for the pain, but does not feel that it helped.  She denies any fever, vomiting or diarrhea.  She reports that she has been somewhat constipated recently.  However, her last BM was this morning and was soft.  She does have a history of Diverticulitis.    The history is provided by the patient.    Past Medical History  Diagnosis Date  . Hypertension   . Hyperlipemia   . Fibromyalgia   . Anxiety   . Dysrhythmia     RX  . Blood transfusion   . Chronic kidney disease     STONES  . GERD (gastroesophageal reflux disease)   . Headache   . Arthritis   . Cataract     Past Surgical History  Procedure Date  . Abdominal hysterectomy   . Hand surgery     BIL   . Shoulder arthroscopy w/ rotator cuff repair     LFT  . Cervical disc surgery   . Back surgery     X2  . Orif tripod fracture 07/13/2011    Procedure: OPEN REDUCTION INTERNAL FIXATION (ORIF) TRIPOD FRACTURE;  Surgeon: [name], MD;  Location: [facility] OR;  Service: ENT;  Laterality: Right;  ORIF RIGHT ZYGOMA, ORBITAL FLOOR EXPLORATION WITH FROST STITCH (TEMPORARY  TARSORRHAPHY)  . Colonoscopy   . Facial fracture surgery     Family History  Problem Relation Age of Onset  . Heart disease Mother   . Breast cancer Sister   . Colon cancer Sister     History  Substance Use Topics  . Smoking status: Never Smoker   . Smokeless tobacco: Never Used  . Alcohol Use: No    OB History    Grav Para Term Preterm Abortions TAB SAB Ect Mult Living                  Review of Systems  Constitutional: Negative for fever, chills, diaphoresis and appetite change.  Respiratory: Negative for shortness of breath.   Cardiovascular: Negative for chest pain.  Gastrointestinal: Positive for abdominal pain and constipation. Negative for nausea, vomiting, diarrhea, blood in stool and abdominal distention.  Genitourinary: Positive for dysuria. Negative for urgency, frequency, hematuria, flank pain, decreased urine volume, vaginal bleeding, vaginal discharge and difficulty urinating.  Neurological: Negative for dizziness, syncope and light-headedness.  Psychiatric/Behavioral: Negative for confusion.    Allergies  Latex; Chlorhexidine gluconate; Aspirin; Codeine; Prochlorperazine; Betadine; and Caffeine  Home Medications   Current Outpatient Rx  Name Route Sig Dispense Refill  . ALPRAZOLAM 1 MG PO TABS Oral Take  0.5-1 mg by mouth 3 (three) times daily as needed. For anxiety    . ATENOLOL 100 MG PO TABS Oral Take 100 mg by mouth every morning.     Marland Kitchen CALCIUM 600 + D PO Oral Take 1 tablet by mouth daily.    Marland Kitchen VITAMIN D 1000 UNITS PO TABS Oral Take 2,000 Units by mouth daily.    . OMEGA-3 FATTY ACIDS 1000 MG PO CAPS Oral Take 1 g by mouth 3 (three) times daily.    Marland Kitchen FLAX SEED OIL 1300 MG PO CAPS Oral Take 1 capsule by mouth 2 (two) times daily.    Marland Kitchen HYOSCYAMINE SULFATE 0.125 MG SL SUBL Sublingual Place 0.125 mg under the tongue every 4 (four) hours as needed. For abdominal cramping    . LOSARTAN POTASSIUM 100 MG PO TABS Oral Take 100 mg by mouth every evening.    Marland Kitchen  MAGNESIUM 250 MG PO TABS Oral Take 250 mg by mouth 3 (three) times daily.    Carma Leaven M PLUS PO TABS Oral Take 1 tablet by mouth daily.    Marland Kitchen PANTOPRAZOLE SODIUM 40 MG PO TBEC Oral Take 40 mg by mouth daily.    Marland Kitchen PRAVASTATIN SODIUM 40 MG PO TABS Oral Take 40 mg by mouth daily.    . TRAMADOL HCL 50 MG PO TABS Oral Take 50 mg by mouth every 6 (six) hours as needed.    Marland Kitchen CLINDINIUM-CHLORDIAZEPOXIDE 2.5-5 MG PO CAPS Oral Take 1 capsule by mouth 2 (two) times daily. 60 capsule 3    BP 145/73  Pulse 90  Temp(Src) 97.7 F (36.5 C) (Oral)  Resp 24  Ht 5\' 4"  (1.626 m)  Wt 117 lb (53.071 kg)  BMI 20.08 kg/m2  SpO2 98%  Physical Exam  Nursing note and vitals reviewed. Constitutional: She appears well-developed and well-nourished. No distress.  HENT:  Head: Normocephalic and atraumatic.  Mouth/Throat: Oropharynx is clear and moist.  Neck: Normal range of motion. Neck supple.  Cardiovascular: Normal rate, regular rhythm and normal heart sounds.   Pulmonary/Chest: Effort normal and breath sounds normal.  Abdominal: Soft. Normal appearance and bowel sounds are normal. She exhibits no mass. There is tenderness in the left lower quadrant. There is no rigidity, no rebound, no guarding, no CVA tenderness, no tenderness at McBurney's point and negative Murphy's sign.       Mild tenderness to palpation of the LLQ  Neurological: She is alert. Gait normal.  Skin: Skin is warm and dry. She is not diaphoretic. No erythema.  Psychiatric: Her speech is normal and behavior is normal. Thought content normal. Her mood appears anxious.    ED Course  Procedures (including critical care time)   Labs Reviewed  URINALYSIS, ROUTINE W REFLEX MICROSCOPIC   No results found.   No diagnosis found.  Discussed with Dr. Hyacinth Meeker who also evaluated patient.  MDM  Patient with prior history of Diverticulitis comes in today with a chief complaint of dysuria for the past 3 days and abdominal pain that began last  evening.  Patient afebrile.  Mild tenderness to palpation of the LLQ.  No rebound or guarding on abdominal exam.  No vomiting or diarrhea.   UA negative for UTI.  Discussed plan with Dr. Hyacinth Meeker.  Patient treated for Diverticulitis.  Patient recently on Cipro, therefore, treated with Augmentin.  Patient declined pain medication.  Patient instructed to follow up with PCP.  Return precautions discussed with patient.          Herbert Seta  Anne Shutter, PA-C 11/19/11 [id number]

## 2011-11-19 NOTE — ED Notes (Signed)
Pt sts she was supposed to finish her last dose of ABX last night, but she called her MD on Friday and he stated that she could stop taking them. Patient experiencing painful urination and suprapubic pain since being on the antibiotic. Sts pain level 9/10. Patient has been using lamisil cream on her labia to try to help the burning she is experiencing.

## 2011-11-19 NOTE — ED Notes (Signed)
Patient given discharge instructions, information, prescriptions, and diet order. Patient states that they adequately understand discharge information given and to return to ED if symptoms return or worsen.     

## 2011-11-19 NOTE — Discharge Instructions (Signed)
Take antibiotics as prescribed.  Follow up with your Primary Care Physician.  Return to the Emergency Department if symptoms change or worsen.  Abdominal Pain  Your exam might not show the exact reason you have abdominal pain. Since there are many different causes of abdominal pain, another checkup and more tests may be needed. It is very important to follow up for lasting (persistent) or worsening symptoms. A possible cause of abdominal pain in any person who still has his or her appendix is acute appendicitis. Appendicitis is often hard to diagnose. Normal blood tests, urine tests, ultrasound, and CT scans do not completely rule out early appendicitis or other causes of abdominal pain. Sometimes, only the changes that happen over time will allow appendicitis and other causes of abdominal pain to be determined. Other potential problems that may require surgery may also take time to become more apparent. Because of this, it is important that you follow all of the instructions below.   HOME CARE INSTRUCTIONS  Do not take laxatives unless directed by your caregiver. Rest as much as possible.  Do not eat solid food until your pain is gone: A diet of water, weak decaffeinated tea, broth or bouillon, gelatin, oral rehydration solutions (ORS), frozen ice pops, or ice chips may be helpful.  When pain is gone: Start a light diet (dry toast, crackers, applesauce, or white rice). Increase the diet slowly as long as it does not bother you. Eat no dairy products (including cheese and eggs) and no spicy, fatty, fried, or high-fiber foods.  Use no alcohol, caffeine, or cigarettes.  Take your regular medicines unless your caregiver told you not to.  Take any prescribed medicine as directed.   SEEK IMMEDIATE MEDICAL CARE IF:  The pain does not go away.  You have a fever >101 that persists You keep throwing up (vomiting) or cannot drink liquids.  The pain becomes localized (Pain in the right side could possibly be  appendicitis. In an adult, pain in the left lower portion of the abdomen could be colitis or diverticulitis). You pass bloody or black tarry stools.  You have shaking chills.  There is blood in your vomit or you see blood in your bowel movements.  Your bowel movements stop (become blocked) or you cannot pass gas.  You have bloody, frequent, or painful urination.  You have yellow discoloration in the skin or whites of the eyes.  Your stomach becomes bloated or bigger.  You have dizziness or fainting.  You have chest or back pain.

## 2011-11-19 NOTE — ED Notes (Signed)
Pt c/o suprapubic pain and dysuria starting yesterday at the worst. Pt also c/o groin itching. Pt has had repeated courses of abx since January and just finished another course this past week.

## 2011-11-19 NOTE — ED Notes (Signed)
78 year old female with recent history of urinary tract infection has been on several doses of ciprofloxacin presents with a complaint of abdominal pain. This occurred overnight, with the chief patient with the lower abdomen and also radiated up from the suprapubic region to the epigastrium. It is fluctuating in intensity, currently totally resolved and is not associated with vomiting or diarrhea.  Physical exam, abdomen is soft and minimally tender in the left lower quadrant, no other tenderness to palpation, no lymphadenopathy in the inguinal regions, clear heart and lungs, no acute distress, normal pulses at the feet, normal capillary refill, no peripheral edema  Assessment, patient sibling is a recurrent diverticulitis that she has had this in the past. She also has urinalysis which is clean and free of any signs of infection. Augmentin twice a day, has pain medication already at home and is willing to follow her family Dr. She declines further workup at this time.  Abdomen is non-peritoneal, soft and benign. Vital signs appear normal - 10 on my exam her respiratory rate is only 18  Filed Vitals:   11/19/11 0553  BP: 145/73  Pulse: 90  Temp: 97.7 F (36.5 C)  Resp: 24     Medical screening examination/treatment/procedure(s) were conducted as a shared visit with non-physician practitioner(s) and myself.  I personally evaluated the patient during the encounter    Vida Roller, MD 11/19/11 [phone]

## 2011-11-20 NOTE — ED Provider Notes (Signed)
Medical screening examination/treatment/procedure(s) were conducted as a shared visit with non-physician practitioner(s) and myself.  I personally evaluated the patient during the encounter  Please see my separate respective documentation pertaining to this patient encounter   Vida Roller, MD 11/20/11 [phone]

## 2011-11-23 ENCOUNTER — Emergency Department (HOSPITAL_COMMUNITY): Payer: Medicare Other

## 2011-11-23 ENCOUNTER — Emergency Department (HOSPITAL_COMMUNITY)
Admission: EM | Admit: 2011-11-23 | Discharge: 2011-11-23 | Disposition: A | Discharge status: Home / Self Care | Payer: Medicare Other | Attending: Emergency Medicine | Admitting: Emergency Medicine

## 2011-11-23 ENCOUNTER — Encounter (HOSPITAL_COMMUNITY): Payer: Self-pay | Admitting: Emergency Medicine

## 2011-11-23 DIAGNOSIS — R1032 Left lower quadrant pain: Secondary | ICD-10-CM | POA: Insufficient documentation

## 2011-11-23 DIAGNOSIS — E785 Hyperlipidemia, unspecified: Secondary | ICD-10-CM | POA: Insufficient documentation

## 2011-11-23 DIAGNOSIS — K219 Gastro-esophageal reflux disease without esophagitis: Secondary | ICD-10-CM | POA: Insufficient documentation

## 2011-11-23 DIAGNOSIS — I10 Essential (primary) hypertension: Secondary | ICD-10-CM | POA: Insufficient documentation

## 2011-11-23 DIAGNOSIS — R109 Unspecified abdominal pain: Secondary | ICD-10-CM

## 2011-11-23 DIAGNOSIS — R197 Diarrhea, unspecified: Secondary | ICD-10-CM | POA: Insufficient documentation

## 2011-11-23 DIAGNOSIS — K625 Hemorrhage of anus and rectum: Secondary | ICD-10-CM | POA: Insufficient documentation

## 2011-11-23 LAB — COMPREHENSIVE METABOLIC PANEL
ALT: 14 U/L (ref 0–35)
AST: 18 U/L (ref 0–37)
Albumin: 3.9 g/dL (ref 3.5–5.2)
Alkaline Phosphatase: 83 U/L (ref 39–117)
BUN: 13 mg/dL (ref 6–23)
CO2: 29 mEq/L (ref 19–32)
Calcium: 9.5 mg/dL (ref 8.4–10.5)
Chloride: 94 mEq/L — ABNORMAL LOW (ref 96–112)
Creatinine, Ser: 0.83 mg/dL (ref 0.50–1.10)
GFR calc Af Amer: 78 mL/min — ABNORMAL LOW (ref >90)
GFR calc non Af Amer: 67 mL/min — ABNORMAL LOW (ref >90)
Glucose, Bld: 117 mg/dL — ABNORMAL HIGH (ref 70–99)
Potassium: 3.8 mEq/L (ref 3.5–5.1)
Sodium: 132 mEq/L — ABNORMAL LOW (ref 135–145)
Total Bilirubin: 0.3 mg/dL (ref 0.3–1.2)
Total Protein: 7.2 g/dL (ref 6.0–8.3)

## 2011-11-23 LAB — URINALYSIS, ROUTINE W REFLEX MICROSCOPIC
Bilirubin Urine: NEGATIVE
Glucose, UA: NEGATIVE mg/dL
Hgb urine dipstick: NEGATIVE
Ketones, ur: NEGATIVE mg/dL
Nitrite: NEGATIVE
Protein, ur: NEGATIVE mg/dL
Specific Gravity, Urine: 1.024 (ref 1.005–1.030)
Urobilinogen, UA: 0.2 mg/dL (ref 0.0–1.0)
pH: 6.5 (ref 5.0–8.0)

## 2011-11-23 LAB — URINE MICROSCOPIC-ADD ON

## 2011-11-23 LAB — DIFFERENTIAL
Basophils Absolute: 0 10*3/uL (ref 0.0–0.1)
Basophils Relative: 0 % (ref 0–1)
Eosinophils Absolute: 0.2 10*3/uL (ref 0.0–0.7)
Eosinophils Relative: 3 % (ref 0–5)
Lymphocytes Relative: 22 % (ref 12–46)
Lymphs Abs: 1.1 10*3/uL (ref 0.7–4.0)
Monocytes Absolute: 0.5 10*3/uL (ref 0.1–1.0)
Monocytes Relative: 11 % (ref 3–12)
Neutro Abs: 3 10*3/uL (ref 1.7–7.7)
Neutrophils Relative %: 64 % (ref 43–77)

## 2011-11-23 LAB — CBC
HCT: 37.1 % (ref 36.0–46.0)
Hemoglobin: 12.5 g/dL (ref 12.0–15.0)
MCH: 31.3 pg (ref 26.0–34.0)
MCHC: 33.7 g/dL (ref 30.0–36.0)
MCV: 92.8 fL (ref 78.0–100.0)
Platelets: 238 10*3/uL (ref 150–400)
RBC: 4 MIL/uL (ref 3.87–5.11)
RDW: 12.4 % (ref 11.5–15.5)
WBC: 4.8 10*3/uL (ref 4.0–10.5)

## 2011-11-23 LAB — OCCULT BLOOD, POC DEVICE
Fecal Occult Bld: POSITIVE
Fecal Occult Bld: NEGATIVE

## 2011-11-23 MED ORDER — ONDANSETRON HCL 4 MG/2ML IJ SOLN
4.0000 mg | Freq: Once | INTRAMUSCULAR | Status: AC
  Administered 2011-11-23: 4 mg via INTRAVENOUS
  Filled 2011-11-23: qty 2

## 2011-11-23 MED ORDER — MORPHINE SULFATE 4 MG/ML IJ SOLN
4.0000 mg | Freq: Once | INTRAMUSCULAR | Status: AC
  Administered 2011-11-23: 4 mg via INTRAVENOUS
  Filled 2011-11-23: qty 1

## 2011-11-23 MED ORDER — SODIUM CHLORIDE 0.9 % IV BOLUS (SEPSIS)
500.0000 mL | INTRAVENOUS | Status: AC
  Administered 2011-11-23: 500 mL via INTRAVENOUS

## 2011-11-23 MED ORDER — HYDROCODONE-ACETAMINOPHEN 5-325 MG PO TABS: 1.0000 | ORAL_TABLET | Freq: Four times a day (QID) | ORAL | Status: AC | PRN

## 2011-11-23 MED ORDER — IOHEXOL 300 MG/ML  SOLN
100.0000 mL | Freq: Once | INTRAMUSCULAR | Status: AC | PRN
  Administered 2011-11-23: 100 mL via INTRAVENOUS

## 2011-11-23 NOTE — ED Notes (Signed)
Patient is alert and oriented x3.  She is complaining of left flank pain that is rated 8 of 10. The pain started Monday morning.  Patient has had a history of the same issue with bleeding. Patient states that she started having blood in her stool after starting ABT.

## 2011-11-23 NOTE — ED Notes (Signed)
Patient reports rectal bleeding that started at 3:00 am. Patient also reports that she has diarrhea for 3 days.

## 2011-11-23 NOTE — Discharge Instructions (Signed)
You need to stop the Cipro and Flagyl. Follow up with your doctor for a recheck. There is no blood noted when we test your stool for blood. Your blood counts are normal. Continue the Augmentin. You can take Imodium for diarrhea. Increase your fluid intake.

## 2011-11-23 NOTE — ED Provider Notes (Signed)
History     CSN: [id number]  Arrival date & time 11/23/11  0555   First MD Initiated Contact with Patient 11/23/11 [phone]      Chief Complaint  Patient presents with  . Rectal Bleeding    (Consider location/radiation/quality/duration/timing/severity/associated sxs/prior treatment) HPI Patient presents to the emergency department with a history of left lower abdominal pain since Monday.  She states that she noted that her stool is bloody this morning around 3 AM.  Patient states that she was seen here on Monday and did not have any testing done.  Patient denies fevers, chest pain, breath, vomiting, weakness, headache, visual change, shortness of breath, or dizziness. The patient states that she has had diarrhea for several days. The patient states that nothing has made her pain improve.      Past Medical History  Diagnosis Date  . Hypertension   . Hyperlipemia   . Fibromyalgia   . Anxiety   . Dysrhythmia     RX  . Blood transfusion   . Chronic kidney disease     STONES  . GERD (gastroesophageal reflux disease)   . Headache   . Arthritis   . Cataract     Past Surgical History  Procedure Date  . Abdominal hysterectomy   . Hand surgery     BIL   . Shoulder arthroscopy w/ rotator cuff repair     LFT  . Cervical disc surgery   . Back surgery     X2  . Orif tripod fracture 07/13/2011    Procedure: OPEN REDUCTION INTERNAL FIXATION (ORIF) TRIPOD FRACTURE;  Surgeon: [name], MD;  Location: [facility] OR;  Service: ENT;  Laterality: Right;  ORIF RIGHT ZYGOMA, ORBITAL FLOOR EXPLORATION WITH FROST STITCH (TEMPORARY TARSORRHAPHY)  . Colonoscopy   . Facial fracture surgery     Family History  Problem Relation Age of Onset  . Heart disease Mother   . Breast cancer Sister   . Colon cancer Sister     History  Substance Use Topics  . Smoking status: Never Smoker   . Smokeless tobacco: Never Used  . Alcohol Use: No    OB History    Grav Para Term Preterm Abortions TAB SAB  Ect Mult Living                  Review of Systems All other systems negative except as documented in the HPI. All pertinent positives and negatives as reviewed in the HPI.  Allergies  Latex; Chlorhexidine gluconate; Aspirin; Codeine; Prochlorperazine; Betadine; and Caffeine  Home Medications   Current Outpatient Rx  Name Route Sig Dispense Refill  . ALPRAZOLAM 1 MG PO TABS Oral Take 0.5-1 mg by mouth 3 (three) times daily as needed. For anxiety    . AMOXICILLIN-POT CLAVULANATE 500-125 MG PO TABS Oral Take 1 tablet (500 mg total) by mouth 2 (two) times daily. 20 tablet 0  . ATENOLOL 100 MG PO TABS Oral Take 100 mg by mouth every morning.     Marland Kitchen CALCIUM 600 + D PO Oral Take 1 tablet by mouth daily.    Marland Kitchen VITAMIN D 1000 UNITS PO TABS Oral Take 2,000 Units by mouth daily.    . OMEGA-3 FATTY ACIDS 1000 MG PO CAPS Oral Take 1 g by mouth 3 (three) times daily.    Marland Kitchen FLAX SEED OIL 1300 MG PO CAPS Oral Take 1 capsule by mouth 2 (two) times daily.    Marland Kitchen HYOSCYAMINE SULFATE 0.125 MG SL SUBL Sublingual  Place 0.125 mg under the tongue every 4 (four) hours as needed. For abdominal cramping    . LOSARTAN POTASSIUM 100 MG PO TABS Oral Take 100 mg by mouth every evening.    Marland Kitchen MAGNESIUM 250 MG PO TABS Oral Take 250 mg by mouth 3 (three) times daily.    Carma Leaven M PLUS PO TABS Oral Take 1 tablet by mouth daily.    Marland Kitchen PANTOPRAZOLE SODIUM 40 MG PO TBEC Oral Take 40 mg by mouth daily.    Marland Kitchen PRAVASTATIN SODIUM 40 MG PO TABS Oral Take 40 mg by mouth daily.    . TRAMADOL HCL 50 MG PO TABS Oral Take 50 mg by mouth every 6 (six) hours as needed.    Marland Kitchen CLINDINIUM-CHLORDIAZEPOXIDE 2.5-5 MG PO CAPS Oral Take 1 capsule by mouth 2 (two) times daily. 60 capsule 3    BP 132/65  Pulse 65  Temp(Src) 97.4 F (36.3 C) (Oral)  Resp 18  SpO2 100%  Physical Exam  ED Course  Procedures (including critical care time)  Labs Reviewed  COMPREHENSIVE METABOLIC PANEL - Abnormal; Notable for the following:    Sodium 132 (*)      Chloride 94 (*)    Glucose, Bld 117 (*)    GFR calc non Af Amer 67 (*)    GFR calc Af Amer 78 (*)    All other components within normal limits  URINALYSIS, ROUTINE W REFLEX MICROSCOPIC - Abnormal; Notable for the following:    Leukocytes, UA SMALL (*)    All other components within normal limits  URINE MICROSCOPIC-ADD ON - Abnormal; Notable for the following:    Bacteria, UA FEW (*)    Casts HYALINE CASTS (*)    Crystals CA OXALATE CRYSTALS (*)    All other components within normal limits  CBC  DIFFERENTIAL  OCCULT BLOOD, POC DEVICE  OCCULT BLOOD, POC DEVICE   Ct Abdomen Pelvis W Contrast  11/23/2011  *RADIOLOGY REPORT*  Clinical Data: Left lower quadrant pain.  Rectal bleeding. Diarrhea.  Diverticulitis.  CT ABDOMEN AND PELVIS WITH CONTRAST  Technique:  Multidetector CT imaging of the abdomen and pelvis was performed following the standard protocol during bolus administration of intravenous contrast.  Contrast: OMNIPAQUE IOHEXOL 300 MG/ML  SOLN  Comparison: None.  Findings: The liver, gallbladder, pancreas, spleen, and adrenal glands are normal in appearance.  Tiny sub-centimeter left renal cyst is noted as well as mild left renal parenchymal scarring, however there is no evidence of renal mass or hydronephrosis.  No soft tissue masses or lymphadenopathy identified within the abdomen or pelvis.  Diverticulosis is seen involving the sigmoid colon.  There is mild sigmoid colon wall thickening, without evidence of peri colonic inflammatory changes.  These findings may be due to mild diverticulitis versus chronic muscular hypertrophy secondary to diverticulosis.  No other inflammatory process or abnormal fluid collections are seen.  IMPRESSION:  1.  Sigmoid diverticulosis, with mild wall thickening can be seen with chronic muscular hypertrophy although mild diverticulitis cannot definitely be excluded.  No evidence of pericolonic inflammatory changes or abscess. 2.  No other acute findings.   Original Report Authenticated By: Danae Orleans, M.D.    On rectal examination the patient has a slight reddish colored stool, but it is negative for blood based on Hemoccult testing.  Patient's CT scan does show some mild inflammation in the distal colon.  No definite signs of diverticulitis.  Patient is advised that he to continue the Cipro and Flagyl.  I feel that the Flagyl and Cipro to stop to continue the Augmentin.  I feel that the patient's diarrhea is most likely related to the 3, antibiotics she is currently taking.  Patient is advised to follow up with her primary care doctor for recheck.  The nurse tech at her initial Hemoccult said that it is not grossly positive but there may be a trace positive, so she documented as positive.  The stool was not grossly bloody on rectal exam and it was negative when rechecked by me.     Filed Vitals:   11/23/11 0604 11/23/11 0605 11/23/11 0843 11/23/11 1003  BP: 151/77   132/65  Pulse: 67   65  Temp: 97.5 F (36.4 C)  97.4 F (36.3 C)   TempSrc: Oral  Oral   Resp: 18   18  SpO2: 100% 100%     MDM  MDM Reviewed: nursing note, vitals and previous chart Reviewed previous: labs Interpretation: labs and CT scan            Carlyle Dolly, PA-C 11/23/11 1108

## 2011-11-24 NOTE — ED Provider Notes (Signed)
Medical screening examination/treatment/procedure(s) were performed by non-physician practitioner and as supervising physician I was immediately available for consultation/collaboration.   Lyanne Co, MD 11/24/11 [phone]

## 2011-12-21 ENCOUNTER — Encounter (HOSPITAL_COMMUNITY): Payer: Self-pay

## 2011-12-21 ENCOUNTER — Emergency Department (HOSPITAL_COMMUNITY): Payer: Medicare Other

## 2011-12-21 ENCOUNTER — Emergency Department (HOSPITAL_COMMUNITY)
Admission: EM | Admit: 2011-12-21 | Discharge: 2011-12-21 | Disposition: A | Discharge status: Home / Self Care | Payer: Medicare Other | Attending: Emergency Medicine | Admitting: Emergency Medicine

## 2011-12-21 DIAGNOSIS — R531 Weakness: Secondary | ICD-10-CM

## 2011-12-21 DIAGNOSIS — R209 Unspecified disturbances of skin sensation: Secondary | ICD-10-CM | POA: Insufficient documentation

## 2011-12-21 DIAGNOSIS — R5381 Other malaise: Secondary | ICD-10-CM | POA: Insufficient documentation

## 2011-12-21 DIAGNOSIS — I10 Essential (primary) hypertension: Secondary | ICD-10-CM | POA: Insufficient documentation

## 2011-12-21 DIAGNOSIS — R079 Chest pain, unspecified: Secondary | ICD-10-CM | POA: Insufficient documentation

## 2011-12-21 DIAGNOSIS — R10819 Abdominal tenderness, unspecified site: Secondary | ICD-10-CM | POA: Insufficient documentation

## 2011-12-21 DIAGNOSIS — R110 Nausea: Secondary | ICD-10-CM | POA: Insufficient documentation

## 2011-12-21 DIAGNOSIS — R109 Unspecified abdominal pain: Secondary | ICD-10-CM | POA: Insufficient documentation

## 2011-12-21 DIAGNOSIS — Z79899 Other long term (current) drug therapy: Secondary | ICD-10-CM | POA: Insufficient documentation

## 2011-12-21 DIAGNOSIS — R5383 Other fatigue: Secondary | ICD-10-CM | POA: Insufficient documentation

## 2011-12-21 DIAGNOSIS — R202 Paresthesia of skin: Secondary | ICD-10-CM

## 2011-12-21 DIAGNOSIS — K219 Gastro-esophageal reflux disease without esophagitis: Secondary | ICD-10-CM | POA: Insufficient documentation

## 2011-12-21 LAB — URINALYSIS, ROUTINE W REFLEX MICROSCOPIC
Bilirubin Urine: NEGATIVE
Glucose, UA: NEGATIVE mg/dL
Hgb urine dipstick: NEGATIVE
Ketones, ur: NEGATIVE mg/dL
Leukocytes, UA: NEGATIVE
Protein, ur: NEGATIVE mg/dL
pH: 8 (ref 5.0–8.0)

## 2011-12-21 LAB — COMPREHENSIVE METABOLIC PANEL
ALT: 17 U/L (ref 0–35)
AST: 19 U/L (ref 0–37)
Albumin: 4 g/dL (ref 3.5–5.2)
Alkaline Phosphatase: 75 U/L (ref 39–117)
CO2: 29 mEq/L (ref 19–32)
Chloride: 92 mEq/L — ABNORMAL LOW (ref 96–112)
Creatinine, Ser: 0.74 mg/dL (ref 0.50–1.10)
GFR calc non Af Amer: 81 mL/min — ABNORMAL LOW (ref >90)
Potassium: 4 mEq/L (ref 3.5–5.1)
Total Bilirubin: 0.5 mg/dL (ref 0.3–1.2)

## 2011-12-21 LAB — CBC WITH DIFFERENTIAL/PLATELET
Basophils Absolute: 0 10*3/uL (ref 0.0–0.1)
Basophils Relative: 0 % (ref 0–1)
HCT: 36 % (ref 36.0–46.0)
Hemoglobin: 12.3 g/dL (ref 12.0–15.0)
Lymphocytes Relative: 11 % — ABNORMAL LOW (ref 12–46)
Monocytes Absolute: 0.6 10*3/uL (ref 0.1–1.0)
Neutro Abs: 7.6 10*3/uL (ref 1.7–7.7)
Neutrophils Relative %: 81 % — ABNORMAL HIGH (ref 43–77)
RDW: 12.1 % (ref 11.5–15.5)
WBC: 9.3 10*3/uL (ref 4.0–10.5)

## 2011-12-21 MED ORDER — FLUCONAZOLE 100 MG PO TABS: 100.0000 mg | ORAL_TABLET | Freq: Every day | ORAL | Status: AC

## 2011-12-21 MED ORDER — LORAZEPAM 2 MG/ML IJ SOLN
0.5000 mg | Freq: Once | INTRAMUSCULAR | Status: AC
  Administered 2011-12-21: 0.5 mg via INTRAVENOUS

## 2011-12-21 MED ORDER — IOHEXOL 300 MG/ML  SOLN
80.0000 mL | Freq: Once | INTRAMUSCULAR | Status: AC | PRN
  Administered 2011-12-21: 80 mL via INTRAVENOUS

## 2011-12-21 MED ORDER — CILIDINIUM-CHLORDIAZEPOXIDE 2.5-5 MG PO CAPS: 1.0000 | ORAL_CAPSULE | Freq: Two times a day (BID) | ORAL | Status: DC

## 2011-12-21 MED ORDER — LORAZEPAM 2 MG/ML IJ SOLN
0.2500 mg | Freq: Once | INTRAMUSCULAR | Status: AC
  Administered 2011-12-21: 0.26 mg via INTRAVENOUS
  Filled 2011-12-21: qty 1

## 2011-12-21 MED ORDER — MAGIC MOUTHWASH W/LIDOCAINE: 10.0000 mL | Freq: Three times a day (TID) | ORAL | Status: DC | PRN

## 2011-12-21 MED ORDER — GI COCKTAIL ~~LOC~~
30.0000 mL | Freq: Once | ORAL | Status: AC
  Administered 2011-12-21: 30 mL via ORAL
  Filled 2011-12-21: qty 30

## 2011-12-21 MED ORDER — ONDANSETRON HCL 4 MG/2ML IJ SOLN
4.0000 mg | Freq: Once | INTRAMUSCULAR | Status: AC
  Administered 2011-12-21: 4 mg via INTRAVENOUS
  Filled 2011-12-21: qty 2

## 2011-12-21 MED ORDER — IOHEXOL 300 MG/ML  SOLN
20.0000 mL | INTRAMUSCULAR | Status: AC
  Administered 2011-12-21 (×2): 20 mL via ORAL

## 2011-12-21 MED ORDER — ACETAMINOPHEN 325 MG PO TABS
650.0000 mg | ORAL_TABLET | Freq: Once | ORAL | Status: AC
  Administered 2011-12-21: 650 mg via ORAL
  Filled 2011-12-21: qty 1

## 2011-12-21 NOTE — ED Notes (Signed)
Ambulated patient to the restroom and back.  Patient appears to be stable during ambulation.

## 2011-12-21 NOTE — ED Notes (Signed)
nad noted, abc intact.

## 2011-12-21 NOTE — ED Provider Notes (Signed)
History     CSN: [id number]  Arrival date & time 12/21/11  4098   First MD Initiated Contact with Patient 12/21/11 [phone]      Chief Complaint  Patient presents with  . Numbness    (Consider location/radiation/quality/duration/timing/severity/associated sxs/prior treatment) HPI Comments: Pt is a 78yo female who presents by EMS with multiple complaints. Pt states she has had abdominal and chest pain since yesterday that she states is "my reflux." States burping and passing gas a lot. Tried protonix with no relief. States hx of the same. No nausea, vomiting. Pt also states when she got up this morning and tried to walk, her right leg gave out. States it feels weak and her toes are numb. States hx of weakness to that leg from a "back problem." pt also states she has had some right arm numbness, which is chronic as well. Stats also having increased numbness to the right face, which has also been there since January when she factured her face during a fall. Pt denies fever, chills, nausea, vomiting, headache, visual problems, problems speaking.    Past Medical History  Diagnosis Date  . Hypertension   . Hyperlipemia   . Fibromyalgia   . Anxiety   . Dysrhythmia     RX  . Blood transfusion   . Chronic kidney disease     STONES  . GERD (gastroesophageal reflux disease)   . Headache   . Arthritis   . Cataract     Past Surgical History  Procedure Date  . Abdominal hysterectomy   . Hand surgery     BIL   . Shoulder arthroscopy w/ rotator cuff repair     LFT  . Cervical disc surgery   . Back surgery     X2  . Orif tripod fracture 07/13/2011    Procedure: OPEN REDUCTION INTERNAL FIXATION (ORIF) TRIPOD FRACTURE;  Surgeon: [name], MD;  Location: [facility] OR;  Service: ENT;  Laterality: Right;  ORIF RIGHT ZYGOMA, ORBITAL FLOOR EXPLORATION WITH FROST STITCH (TEMPORARY TARSORRHAPHY)  . Colonoscopy   . Facial fracture surgery     Family History  Problem Relation Age of Onset  .  Heart disease Mother   . Breast cancer Sister   . Colon cancer Sister     History  Substance Use Topics  . Smoking status: Never Smoker   . Smokeless tobacco: Never Used  . Alcohol Use: No    OB History    Grav Para Term Preterm Abortions TAB SAB Ect Mult Living                  Review of Systems  Constitutional: Negative for fever and chills.  HENT: Negative for facial swelling, neck pain and neck stiffness.   Respiratory: Positive for chest tightness. Negative for cough, shortness of breath and wheezing.   Cardiovascular: Positive for chest pain. Negative for palpitations and leg swelling.  Gastrointestinal: Positive for abdominal pain. Negative for nausea, vomiting and diarrhea.  Genitourinary: Negative for dysuria, urgency and flank pain.  Musculoskeletal: Positive for extremity weakness.  Skin: Negative.   Neurological: Positive for weakness and numbness. Negative for dizziness, facial asymmetry, speech difficulty and headaches.    Allergies  Latex; Chlorhexidine gluconate; Aspirin; Codeine; Prochlorperazine; Betadine; and Caffeine  Home Medications   Current Outpatient Rx  Name Route Sig Dispense Refill  . ACETAMINOPHEN 500 MG PO TABS Oral Take 1,000 mg by mouth every 6 (six) hours as needed. For pain    . ALPRAZOLAM  1 MG PO TABS Oral Take 0.5-1 mg by mouth 3 (three) times daily as needed. For anxiety    . ATENOLOL 100 MG PO TABS Oral Take 100 mg by mouth every morning.     Marland Kitchen VITAMIN D 1000 UNITS PO TABS Oral Take 2,000 Units by mouth daily.    Marland Kitchen LOSARTAN POTASSIUM 100 MG PO TABS Oral Take 100 mg by mouth every evening.    Marland Kitchen MAGNESIUM 250 MG PO TABS Oral Take 250 mg by mouth 3 (three) times daily.    Carma Leaven M PLUS PO TABS Oral Take 1 tablet by mouth daily.    Marland Kitchen PANTOPRAZOLE SODIUM 40 MG PO TBEC Oral Take 40 mg by mouth daily.    Marland Kitchen PRAVASTATIN SODIUM 40 MG PO TABS Oral Take 40 mg by mouth daily.    Marland Kitchen CLINDINIUM-CHLORDIAZEPOXIDE 2.5-5 MG PO CAPS Oral Take 1 capsule  by mouth 2 (two) times daily. 60 capsule 3    BP 156/79  Pulse 58  Temp 98.7 F (37.1 C) (Oral)  Resp 18  SpO2 98%  Physical Exam  Nursing note and vitals reviewed. Constitutional: She is oriented to person, place, and time. She appears well-developed and well-nourished. No distress.  HENT:  Head: Normocephalic and atraumatic.  Eyes: Conjunctivae and EOM are normal. Pupils are equal, round, and reactive to light.  Neck: Normal range of motion. Neck supple.  Cardiovascular: Normal rate, regular rhythm and normal heart sounds.   Pulmonary/Chest: Effort normal and breath sounds normal. No respiratory distress. She has no wheezes. She has no rales.  Abdominal: Soft. Bowel sounds are normal. She exhibits no distension. There is tenderness. There is no rebound and no guarding.       Diffuse abdominal tenderness, worse in the epigastric region  Musculoskeletal: Normal range of motion. She exhibits no edema and no tenderness.  Neurological: She is alert and oriented to person, place, and time. She has normal reflexes. No cranial nerve deficit. She exhibits normal muscle tone. Coordination normal.       Equal and 5/5 grip strength bilaterally. No pronator drift, 5/5 and equal lower extremity strength bilaterally. Normal sensation and equal bilat over upper extremities all dermatomes. Decreased sensation over all of the toes of right foot when compared to the left.   Skin: Skin is warm and dry.  Psychiatric: She has a normal mood and affect.    ED Course  Procedures (including critical care time)  Pt with chronic back pain, no new injuries, leg weakness, chronic facial pain. Also with burning in her chest and abdomen, belching, states feels like her GERD. Will try Gi coctail, labs, enzymes, x-ray. Will monitor.   Results for orders placed during the hospital encounter of 12/21/11  CBC WITH DIFFERENTIAL      Component Value Range   WBC 9.3  4.0 - 10.5 K/uL   RBC 3.91  3.87 - 5.11 MIL/uL    Hemoglobin 12.3  12.0 - 15.0 g/dL   HCT 96.0  45.4 - 09.8 %   MCV 92.1  78.0 - 100.0 fL   MCH 31.5  26.0 - 34.0 pg   MCHC 34.2  30.0 - 36.0 g/dL   RDW 11.9  14.7 - 82.9 %   Platelets 244  150 - 400 K/uL   Neutrophils Relative 81 (*) 43 - 77 %   Neutro Abs 7.6  1.7 - 7.7 K/uL   Lymphocytes Relative 11 (*) 12 - 46 %   Lymphs Abs 1.1  0.7 -  4.0 K/uL   Monocytes Relative 7  3 - 12 %   Monocytes Absolute 0.6  0.1 - 1.0 K/uL   Eosinophils Relative 0  0 - 5 %   Eosinophils Absolute 0.0  0.0 - 0.7 K/uL   Basophils Relative 0  0 - 1 %   Basophils Absolute 0.0  0.0 - 0.1 K/uL  COMPREHENSIVE METABOLIC PANEL      Component Value Range   Sodium 131 (*) 135 - 145 mEq/L   Potassium 4.0  3.5 - 5.1 mEq/L   Chloride 92 (*) 96 - 112 mEq/L   CO2 29  19 - 32 mEq/L   Glucose, Bld 133 (*) 70 - 99 mg/dL   BUN 10  6 - 23 mg/dL   Creatinine, Ser 1.61  0.50 - 1.10 mg/dL   Calcium 9.5  8.4 - 09.6 mg/dL   Total Protein 7.5  6.0 - 8.3 g/dL   Albumin 4.0  3.5 - 5.2 g/dL   AST 19  0 - 37 U/L   ALT 17  0 - 35 U/L   Alkaline Phosphatase 75  39 - 117 U/L   Total Bilirubin 0.5  0.3 - 1.2 mg/dL   GFR calc non Af Amer 81 (*) >90 mL/min   GFR calc Af Amer >90  >90 mL/min  LIPASE, BLOOD      Component Value Range   Lipase 38  11 - 59 U/L  URINALYSIS, ROUTINE W REFLEX MICROSCOPIC      Component Value Range   Color, Urine YELLOW  YELLOW   APPearance CLEAR  CLEAR   Specific Gravity, Urine 1.011  1.005 - 1.030   pH 8.0  5.0 - 8.0   Glucose, UA NEGATIVE  NEGATIVE mg/dL   Hgb urine dipstick NEGATIVE  NEGATIVE   Bilirubin Urine NEGATIVE  NEGATIVE   Ketones, ur NEGATIVE  NEGATIVE mg/dL   Protein, ur NEGATIVE  NEGATIVE mg/dL   Urobilinogen, UA 0.2  0.0 - 1.0 mg/dL   Nitrite NEGATIVE  NEGATIVE   Leukocytes, UA NEGATIVE  NEGATIVE  POCT I-STAT TROPONIN I      Component Value Range   Troponin i, poc 0.00  0.00 - 0.08 ng/mL   Comment 3            Dg Chest 2 View  12/21/2011  *RADIOLOGY REPORT*  Clinical Data:  Chest epigastric pain.  Gastroesophageal reflux disease.  CHEST - 2 VIEW  Comparison: 07/11/2011  Findings: Mild cardiomegaly remains stable.  Both lungs are clear. No evidence of pleural effusion.  No mass or lymphadenopathy identified. Mild mid thoracic vertebral body compression deformity remains stable.  IMPRESSION: Stable mild cardiomegaly.  No active disease.  Original Report Authenticated By: Danae Orleans, M.D.   Ct Head Wo Contrast  12/21/2011  *RADIOLOGY REPORT*  Clinical Data: Headache.  Right facial numbness.  Weakness  CT HEAD WITHOUT CONTRAST  Technique:  Contiguous axial images were obtained from the base of the skull through the vertex without contrast.  Comparison: CT head 09/01/2011  Findings: Ventricle size is normal.  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  No acute infarct. Negative for hemorrhage or mass.  Surgical plate of the right superior orbit.  No acute bony abnormality.  IMPRESSION: Mild chronic microvascular ischemia.  No acute abnormality.  Original Report Authenticated By: Camelia Phenes, M.D.      Dg Abd 2 Views  12/21/2011  *RADIOLOGY REPORT*  Clinical Data: Epigastric and upper abdominal pain.  ABDOMEN - 2 VIEW  Comparison: None.  Findings: Scattered air fluid levels are seen within nondilated small bowel loops within the pelvis.  Colon is nondilated.  No evidence of free intraperitoneal air.  No radiopaque calculi identified.  IMPRESSION: Nonspecific, nonobstructive bowel gas pattern.  Original Report Authenticated By: Danae Orleans, M.D.   Pt appears very anxious, hx of diverticulitis, will repeat CT scan due to continuing to have abdominal pain. Pt also being followed by Dr. Jarold Motto for esophagitis, candida. I ordered her some ativan for anxiety, zofran.   Ct Abdomen Pelvis W Contrast  12/21/2011  *RADIOLOGY REPORT*  Clinical Data: Abdominal pain and indigestion.  Nausea.  Weight loss.  Previous appendectomy and hysterectomy  peri  CT ABDOMEN AND PELVIS WITH CONTRAST  Technique:  Multidetector CT imaging of the abdomen and pelvis was performed following the standard protocol during bolus administration of intravenous contrast.  Contrast: 80mL OMNIPAQUE IOHEXOL 300 MG/ML  SOLN  Comparison: 11/23/2011  Findings: The abdominal parenchymal organs are unremarkable in appearance except for 2 tiny less than 1 cm low attenuation lesions in the left kidney, which may represent tiny benign cysts or angiomyolipomas.  Gallbladder is unremarkable in appearance.  No evidence of hydronephrosis.  No other soft tissue masses or lymphadenopathy identified within the abdomen or pelvis.  Prior hysterectomy noted.  Diverticulosis is again seen involving the sigmoid colon.  There is stable mild haustral fold thickening involving the distal sigmoid colon, without pericolonic inflammatory change.  This favors chronic muscular hypertrophy over mild diverticulitis.  No other inflammatory process or abnormal fluid collections seen within the abdomen or pelvis.  No evidence of dilated bowel loops.  IMPRESSION:  Stable appearance of sigmoid diverticulosis and probable chronic muscular hypertrophy; no acute findings.  Original Report Authenticated By: Danae Orleans, M.D.    Pt ambulated. I spoke with Dr.Patterson's nurse, who recommended to continue on librax and diflucan for 1week for possible exacerbation of candida esophagitis. All of her neuro symptoms appear to be chronic. Will follow up outpatient. Pt ate crackers, had PO fluids in ED, tolerated well. WPt is stable for d/c home Filed Vitals:   12/21/11 1438  BP: 152/69  Pulse: 62  Temp: 98.5 F (36.9 C)  Resp: 20     1. Weakness   2. Paresthesia   3. GERD (gastroesophageal reflux disease)   4. Abdominal pain   5. Nausea       MDM          Lottie Mussel, PA 12/21/11 [id number]

## 2011-12-21 NOTE — Discharge Instructions (Signed)
Take diflucan as prescribed daily for a week. Take librax as prescribed for stomach spasms. Make sure you drink plenty of fluids. Please make appointment and follow up with Dr. Jarold Motto next week for recheck. Follow up with your primary care doctor as well. Continue your anxiety medications.   Gastroesophageal Reflux Disease, Adult Gastroesophageal reflux disease (GERD) happens when acid from your stomach flows up into the esophagus. When acid comes in contact with the esophagus, the acid causes soreness (inflammation) in the esophagus. Over time, GERD may create small holes (ulcers) in the lining of the esophagus. CAUSES   Increased body weight. This puts pressure on the stomach, making acid rise from the stomach into the esophagus.   Smoking. This increases acid production in the stomach.   Drinking alcohol. This causes decreased pressure in the lower esophageal sphincter (valve or ring of muscle between the esophagus and stomach), allowing acid from the stomach into the esophagus.   Late evening meals and a full stomach. This increases pressure and acid production in the stomach.   A malformed lower esophageal sphincter.  Sometimes, no cause is found. SYMPTOMS   Burning pain in the lower part of the mid-chest behind the breastbone and in the mid-stomach area. This may occur twice a week or more often.   Trouble swallowing.   Sore throat.   Dry cough.   Asthma-like symptoms including chest tightness, shortness of breath, or wheezing.  DIAGNOSIS  Your caregiver may be able to diagnose GERD based on your symptoms. In some cases, X-rays and other tests may be done to check for complications or to check the condition of your stomach and esophagus. TREATMENT  Your caregiver may recommend over-the-counter or prescription medicines to help decrease acid production. Ask your caregiver before starting or adding any new medicines.  HOME CARE INSTRUCTIONS   Change the factors that you can  control. Ask your caregiver for guidance concerning weight loss, quitting smoking, and alcohol consumption.   Avoid foods and drinks that make your symptoms worse, such as:   Caffeine or alcoholic drinks.   Chocolate.   Peppermint or mint flavorings.   Garlic and onions.   Spicy foods.   Citrus fruits, such as oranges, lemons, or limes.   Tomato-based foods such as sauce, chili, salsa, and pizza.   Fried and fatty foods.   Avoid lying down for the 3 hours prior to your bedtime or prior to taking a nap.   Eat small, frequent meals instead of large meals.   Wear loose-fitting clothing. Do not wear anything tight around your waist that causes pressure on your stomach.   Raise the head of your bed 6 to 8 inches with wood blocks to help you sleep. Extra pillows will not help.   Only take over-the-counter or prescription medicines for pain, discomfort, or fever as directed by your caregiver.   Do not take aspirin, ibuprofen, or other nonsteroidal anti-inflammatory drugs (NSAIDs).  SEEK IMMEDIATE MEDICAL CARE IF:   You have pain in your arms, neck, jaw, teeth, or back.   Your pain increases or changes in intensity or duration.   You develop nausea, vomiting, or sweating (diaphoresis).   You develop shortness of breath, or you faint.   Your vomit is green, yellow, black, or looks like coffee grounds or blood.   Your stool is red, bloody, or black.  These symptoms could be signs of other problems, such as heart disease, gastric bleeding, or esophageal bleeding. MAKE SURE YOU:   Understand  these instructions.   Will watch your condition.   Will get help right away if you are not doing well or get worse.  Document Released: 03/21/2005 Document Revised: 05/31/2011 Document Reviewed: 12/29/2010 [facility] Patient Information 2012 [place], [place].

## 2011-12-21 NOTE — ED Notes (Signed)
Pt here for numbness to toes, indegestion and numbness to face right arm and right leg, pt sts that history of same since fall in January that involved facial fractures. Pt denies pain with ems did attempt to stand up and stated her legs gave away.

## 2011-12-23 NOTE — ED Provider Notes (Signed)
Medical screening examination/treatment/procedure(s) were performed by non-physician practitioner and as supervising physician I was immediately available for consultation/collaboration.  Katisha Shimizu K Rolla Servidio-Rasch, MD 12/23/11 2329 

## 2011-12-24 ENCOUNTER — Telehealth: Payer: Self-pay | Admitting: Gastroenterology

## 2011-12-24 NOTE — Telephone Encounter (Signed)
Left message for patient to call back  

## 2011-12-24 NOTE — Telephone Encounter (Signed)
Patient reports that she was advised to follow up with her primary care MD and Dr. Jarold Motto.  She reports she is happy with the appt date she has for 01/15/12.  She will call back for any changes.  She also reports constipation.  I have advised her to use Miralax 1-2 times a day for constipation.  She will call back for any questions

## 2011-12-24 NOTE — Telephone Encounter (Signed)
Phone is busy I will continue to try and reach the patient  

## 2011-12-25 ENCOUNTER — Other Ambulatory Visit: Payer: Self-pay | Admitting: Family Medicine

## 2011-12-25 DIAGNOSIS — M545 Low back pain, unspecified: Secondary | ICD-10-CM

## 2011-12-30 ENCOUNTER — Ambulatory Visit
Admission: RE | Admit: 2011-12-30 | Discharge: 2011-12-30 | Disposition: A | Discharge status: Home / Self Care | Payer: Medicare Other | Source: Ambulatory Visit | Attending: Family Medicine | Admitting: Family Medicine

## 2011-12-30 DIAGNOSIS — M545 Low back pain, unspecified: Secondary | ICD-10-CM

## 2012-01-08 ENCOUNTER — Ambulatory Visit: Payer: Medicare Other | Admitting: Gastroenterology

## 2012-01-15 ENCOUNTER — Encounter: Payer: Self-pay | Admitting: Gastroenterology

## 2012-01-15 ENCOUNTER — Ambulatory Visit (INDEPENDENT_AMBULATORY_CARE_PROVIDER_SITE_OTHER): Payer: Medicare Other | Admitting: Gastroenterology

## 2012-01-15 VITALS — BP 130/70 | HR 64 | Ht 64.0 in | Wt 108.2 lb

## 2012-01-15 DIAGNOSIS — R142 Eructation: Secondary | ICD-10-CM

## 2012-01-15 DIAGNOSIS — F419 Anxiety disorder, unspecified: Secondary | ICD-10-CM

## 2012-01-15 DIAGNOSIS — R634 Abnormal weight loss: Secondary | ICD-10-CM

## 2012-01-15 DIAGNOSIS — K589 Irritable bowel syndrome without diarrhea: Secondary | ICD-10-CM

## 2012-01-15 DIAGNOSIS — R143 Flatulence: Secondary | ICD-10-CM

## 2012-01-15 DIAGNOSIS — K5730 Diverticulosis of large intestine without perforation or abscess without bleeding: Secondary | ICD-10-CM

## 2012-01-15 DIAGNOSIS — R141 Gas pain: Secondary | ICD-10-CM

## 2012-01-15 DIAGNOSIS — R110 Nausea: Secondary | ICD-10-CM

## 2012-01-15 DIAGNOSIS — F411 Generalized anxiety disorder: Secondary | ICD-10-CM

## 2012-01-15 NOTE — Patient Instructions (Addendum)
Please take your pantoprazole 30 minutes before breakfast daily. Take your Librax after meals as directed. You have been scheduled for a gastric emptying scan at [facility] on 01/24/12 at 8:00 am. Please arrive at least 15 minutes prior to your appointment for registration. Please make certain not to have anything to eat or drink at least 8 hours prior to your test. Hold all stomach medications (ex: Zofran, phenergan, Reglan) 48 hours prior to your test. If you need to reschedule your appointment, please contact radiology scheduling at [phone]. Do NOT take Librax 3 days prior to your scan. We have given you information regarding non absorbable carbohydrates. CC: Dr Lucky Cowboy

## 2012-01-15 NOTE — Progress Notes (Signed)
This is a complicated 78 year old Caucasian female who hit from in January with resultant multiple complaints including early satiety, worsening abdominal gas and belching, and an alleged 25 pound weight loss. She's had extensive workup which has revealed evidence of Candida esophagitis which has been treated on 2 occasions. She no longer complains of dysphagia but has early satiety. She is been in the emergency room on 2 occasions, and has had 2 CT scans which are unremarkable except for diverticulosis. Review of multiple labs shows no abnormalities. Previous small bowel biopsy was normal. She allegedly had colonoscopy several years ago which was unremarkable, and refuses followup exam. She currently is on pantoprazole which seems to be controlling her acid reflux but not her belching. She apparently was placed on Librax in the emergency room which has helped her symptoms somewhat. The patient denies any specific food allergies or abuse of sorbitol or fructose. Allegedly she has daily bowel movements without melena or hematochezia. She relates that her family all have IBS-type complaints without any specific malignancies or other inheritable diseases.  Current Medications, Allergies, Past Medical History, Past Surgical History, Family History and Social History were reviewed in [employer] record.  Pertinent Review of Systems Negative   Physical Exam: Healthy-appearing patient in no acute distress appearing older than her stated age. Blood pressure 130/70 and pulse 64 and regular. Her weight is 108 pounds with a BMI of 18.57. I cannot appreciate stigmata of chronic liver disease. There is no abdominal distention, organomegaly, masses or tenderness. Bowel sounds are normal. I cannot produce a succussion splash. Mental status entirely normal. There is no peripheral edema, phlebitis, or focal neurological deficits.    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 The patient needs colonoscopy to complete her workup, but she has refused. Review of her labs shows no specific abnormalities such as anemia or abnormal liver function tests. I've scheduled her for technetium gastric emptying scan off Librax for 3 days. Otherwise she can use Librax on a when necessary basis with daily Protonix 40 mg. A we'll consider a trial of Xifaxan therapy depending on clinical course. Workup so far show no evidence of malabsorption syndrome. The patient does have a long history of chronic anxiety and is on Xanax 3 times a day. No diagnosis found.

## 2012-01-24 ENCOUNTER — Encounter (HOSPITAL_COMMUNITY)
Admission: RE | Admit: 2012-01-24 | Discharge: 2012-01-24 | Disposition: A | Discharge status: Home / Self Care | Payer: Medicare Other | Source: Ambulatory Visit | Attending: Gastroenterology | Admitting: Gastroenterology

## 2012-01-24 DIAGNOSIS — R110 Nausea: Secondary | ICD-10-CM | POA: Insufficient documentation

## 2012-01-24 DIAGNOSIS — R142 Eructation: Secondary | ICD-10-CM

## 2012-01-24 DIAGNOSIS — R634 Abnormal weight loss: Secondary | ICD-10-CM

## 2012-01-24 MED ORDER — TECHNETIUM TC 99M SULFUR COLLOID
2.0000 | Freq: Once | INTRAVENOUS | Status: AC | PRN
  Administered 2012-01-24: 2 via INTRAVENOUS

## 2012-01-25 ENCOUNTER — Telehealth: Payer: Self-pay | Admitting: Gastroenterology

## 2012-01-25 NOTE — Telephone Encounter (Signed)
Pt seen 01/15/12 after tx for candida esophagitis; still c/o early satiety and belching. She was placed on Protonix daily, Librax PRN and GES was ordered. Pt was to stop Librax 3 days prior to GES that was done yesterday; GES was normal per report. Today, pt reports she feel better off the Librax and wants to know if she can stop it. Explained to pt she was to only take it when needed and it's OK to stop; I will leave Dr Jarold Motto a note. She states she feels some better, belching is a little less often, no nausea or cramping. I asked about a COLON and she stated she's finally getting the feeling back in her face after surgery and she'd rather wait a while. Dr Jarold Motto, you mentioned possible Xifaxan, any orders? Thanks.

## 2012-01-28 MED ORDER — RIFAXIMIN 550 MG PO TABS: 550.0000 mg | ORAL_TABLET | Freq: Two times a day (BID) | ORAL | Status: DC

## 2012-01-28 NOTE — Telephone Encounter (Signed)
Try 10 day course of xifaxan 550 bid

## 2012-01-28 NOTE — Telephone Encounter (Signed)
Pt reports she had a bad "spell" with her stomach over the weekend. She started to take Librax, but took Mylanta instead. Informed her Dr Jarold Motto would like for her to take an AB to see if it helps, since she doesn't feel she can do a COLON at this time. Found pt samples since she doesn't have a good drug plan. Pt will give an update after completing the Xifaxan.

## 2012-02-08 ENCOUNTER — Telehealth: Payer: Self-pay | Admitting: Gastroenterology

## 2012-02-08 MED ORDER — RIFAXIMIN 550 MG PO TABS: 550.0000 mg | ORAL_TABLET | Freq: Two times a day (BID) | ORAL | Status: DC

## 2012-02-08 NOTE — Telephone Encounter (Signed)
Pt completed Xifaxan yesterday as ordered per 01/25/12 telephone encounter. She reports her stomach has calmed down; doesn't roll as much and she gets hungry now. She still has belching, but it is improved and she generally takes Maalox about 6pm at night. When her bowels are about to move, she feels flushed and diaphoretic, but no pain. Explained to pt, the Xifaxan may keep working for a few more days as some antibiotics do. Any other action? Please advise. Thanks.

## 2012-02-08 NOTE — Telephone Encounter (Signed)
Let her try another 10 days of Xifaxan 550 mg twice a day with office followup in 6 weeks' time.

## 2012-02-08 NOTE — Telephone Encounter (Signed)
Informed pt that Dr Jarold Motto would like for her to try Xifaxan for 10 more days and he would like to see her in 6 weeks. Pt is very reluctant to take the Xifaxan again, but she will try. Her appt is 03/11/12. Pt stated understanding.

## 2012-02-14 ENCOUNTER — Telehealth: Payer: Self-pay | Admitting: Gastroenterology

## 2012-02-14 NOTE — Telephone Encounter (Signed)
Pt completed Xifaxan on 02/07/12 and when she called with an update on 02/07/13, she was instructed to start a 10 day course again. She started the med on 02/11/12 and states she feels awful on it, stating the drug makes her weak and she breaks out in a sweat . Pt wants to know if she can decrease to once daily? Her stomach has stopped hurting except with certain foods and she states her appetite has greatly improved.  Her stomach has stopped rolling, but she is still belching with certain foods. Her bowels are OK with normal BMs yesterday and today. Ok to cut back the Xifaxan to daily instead of BID? Thanks.

## 2012-02-15 NOTE — Telephone Encounter (Signed)
Informed pt she may cut back the Xifaxan to once daily; pt stated understanding.

## 2012-02-15 NOTE — Telephone Encounter (Signed)
yes

## 2012-03-06 ENCOUNTER — Telehealth: Payer: Self-pay | Admitting: Gastroenterology

## 2012-03-06 NOTE — Telephone Encounter (Signed)
Pt with  Hx of weight loss, nausea, belching, IBS, diverticulosis seen as a f/u on 01/15/12. It was felt she probably had bacterial overgrowth syndrome and was placed on protonix, librax and GES was ordered; GES was normal. She called back on 01/25/12 and was tx with Xifaxan for 10 days. She called back after 10days and some things were better, but she still c/o belching so she was placed on another 10days of Xifaxan. She called back c/o problems and was to take the Xifaxan daily instead of bid. Today she reports she was doing better; no cramping, no rolling in abdomen like before, but still beching at times and she still has no appetite. She was started on 1 can of Ensure divided into 2 doses daily by her PCP; she has been taking this for about 2 weeks. Now she is c/o diarrhea. She describes her stools as frequent, 4-5 this am already, and loose not watery. People in her church have had a virus. Please advise; stool test? She sees you on Tuesday, 03/11/12. Thanks.

## 2012-03-06 NOTE — Telephone Encounter (Signed)
Needs referral to [facility]

## 2012-03-11 ENCOUNTER — Ambulatory Visit (INDEPENDENT_AMBULATORY_CARE_PROVIDER_SITE_OTHER): Payer: Medicare Other | Admitting: Gastroenterology

## 2012-03-11 ENCOUNTER — Encounter: Payer: Self-pay | Admitting: Gastroenterology

## 2012-03-11 VITALS — BP 134/68 | HR 68 | Ht 64.0 in | Wt 106.0 lb

## 2012-03-11 DIAGNOSIS — R634 Abnormal weight loss: Secondary | ICD-10-CM

## 2012-03-11 DIAGNOSIS — K219 Gastro-esophageal reflux disease without esophagitis: Secondary | ICD-10-CM

## 2012-03-11 DIAGNOSIS — IMO0001 Reserved for inherently not codable concepts without codable children: Secondary | ICD-10-CM

## 2012-03-11 DIAGNOSIS — R197 Diarrhea, unspecified: Secondary | ICD-10-CM

## 2012-03-11 MED ORDER — SUCRALFATE 1 GM/10ML PO SUSP: ORAL | Status: DC

## 2012-03-11 NOTE — Progress Notes (Signed)
This is a very complicated 78 year old Caucasian female who continues with persistent watery diarrhea despite 2 courses of by mouth Xifaxan with probiotic therapy. She also continues to complain of upper abdominal discomfort, acid reflux, but no dysphagia. Last colonoscopy was 6 years ago, she does have a family history of colon cancer. Previous endoscopy showed evidence of Candida esophagitis. She relates that Protonix 40 mg a day seems to make her symptoms worse. Gastric emptying scan was obtained and was normal. Therefore, she has been tried on Librax as needed and also she takes Xanax 0.5-1 mg 3 times a day for facial pain and anxiety. She denies melena or hematochezia, fever, chills, or other systemic complaints. She is been thin all of her life, but does relate some recent weight loss.  Current Medications, Allergies, Past Medical History, Past Surgical History, Family History and Social History were reviewed in [employer] record.  Pertinent Review of Systems Negative   Physical Exam: Thin-appearing female in no acute distress without stigmata of chronic liver disease. Blood pressure 134/68, pulse 68, and weight 106 pounds with a BMI of 18.19. I cannot appreciate stigmata of chronic liver disease. Her abdomen is thin but shows no definite organomegaly, masses, tenderness, or abnormal bowel sounds.    Assessment and Plan: This patient has persistent watery diarrhea, rule out microscopic or collagenous colitis. Also noted is a family history of colon cancer in her sister. I've scheduled followup colonoscopy with multiple colon biopsies. Because of her persistent upper GI complaints, also we'll repeat her endoscopic exam, obtain small bowel biopsy, and I have stopped Protonix and placed her on Carafate suspension 1 tablespoon 3 times a day after meals. Review of her labs shows normal CBC, metabolic and liver profiles.  Please copy her primary care physician, referring  physician, and pertinent subspecialists. Encounter Diagnoses  Name Primary?  . Diarrhea Yes  . Weight loss   . Reflux

## 2012-03-11 NOTE — Patient Instructions (Addendum)
You have been scheduled for an endoscopy and colonoscopy with propofol. Please follow the written instructions given to you at your visit today. Please pick up your prep at the pharmacy within the next 1-3 days. If you use inhalers (even only as needed), please bring them with you on the day of your procedure. Stop your Protonix. We have sent the following medications to your pharmacy for you to pick up at your convenience: Generic Carafate.

## 2012-03-12 ENCOUNTER — Telehealth: Payer: Self-pay | Admitting: Gastroenterology

## 2012-03-12 MED ORDER — SUCRALFATE 1 G PO TABS: 1.0000 g | ORAL_TABLET | Freq: Four times a day (QID) | ORAL | Status: DC

## 2012-03-12 MED ORDER — PEG 3350-KCL-NA BICARB-NACL 420 G PO SOLR: 4000.0000 mL | Freq: Once | ORAL | Status: DC

## 2012-03-12 NOTE — Telephone Encounter (Signed)
Pt have ECL. Here.

## 2012-03-12 NOTE — Telephone Encounter (Signed)
Pharmacist stated pt would not pay for the Carafate; reordered tabs for slurry. Also, OK per Dr Jarold Motto for pt to use Tri Lyte for her prep; ordered.

## 2012-03-17 ENCOUNTER — Ambulatory Visit (AMBULATORY_SURGERY_CENTER): Payer: Medicare Other | Admitting: Gastroenterology

## 2012-03-17 VITALS — BP 149/75 | HR 65 | Temp 98.8°F | Resp 23 | Ht 64.0 in | Wt 106.0 lb

## 2012-03-17 DIAGNOSIS — K5730 Diverticulosis of large intestine without perforation or abscess without bleeding: Secondary | ICD-10-CM

## 2012-03-17 DIAGNOSIS — D126 Benign neoplasm of colon, unspecified: Secondary | ICD-10-CM

## 2012-03-17 DIAGNOSIS — D1330 Benign neoplasm of unspecified part of small intestine: Secondary | ICD-10-CM

## 2012-03-17 DIAGNOSIS — Z800 Family history of malignant neoplasm of digestive organs: Secondary | ICD-10-CM

## 2012-03-17 DIAGNOSIS — R197 Diarrhea, unspecified: Secondary | ICD-10-CM

## 2012-03-17 DIAGNOSIS — Z8719 Personal history of other diseases of the digestive system: Secondary | ICD-10-CM

## 2012-03-17 DIAGNOSIS — R634 Abnormal weight loss: Secondary | ICD-10-CM

## 2012-03-17 DIAGNOSIS — K219 Gastro-esophageal reflux disease without esophagitis: Secondary | ICD-10-CM

## 2012-03-17 MED ORDER — SODIUM CHLORIDE 0.9 % IV SOLN: 500.0000 mL | INTRAVENOUS | Status: DC

## 2012-03-17 MED ORDER — SUCRALFATE 1 GM/10ML PO SUSP: ORAL | Status: DC

## 2012-03-17 NOTE — Patient Instructions (Addendum)
YOU HAD AN ENDOSCOPIC PROCEDURE TODAY AT THE [facility]: Refer to the procedure report that was given to you for any specific questions about what was found during the examination.  If the procedure report does not answer your questions, please call your gastroenterologist to clarify.  If you requested that your care partner not be given the details of your procedure findings, then the procedure report has been included in a sealed envelope for you to review at your convenience later.  YOU SHOULD EXPECT: Some feelings of bloating in the abdomen. Passage of more gas than usual.  Walking can help get rid of the air that was put into your GI tract during the procedure and reduce the bloating. If you had a lower endoscopy (such as a colonoscopy or flexible sigmoidoscopy) you may notice spotting of blood in your stool or on the toilet paper. If you underwent a bowel prep for your procedure, then you may not have a normal bowel movement for a few days.  DIET: Your first meal following the procedure should be a light meal and then it is ok to progress to your normal diet.  A half-sandwich or bowl of soup is an example of a good first meal.  Heavy or fried foods are harder to digest and may make you feel nauseous or bloated.  Likewise meals heavy in dairy and vegetables can cause extra gas to form and this can also increase the bloating.  Drink plenty of fluids but you should avoid alcoholic beverages for 24 hours.  ACTIVITY: Your care partner should take you home directly after the procedure.  You should plan to take it easy, moving slowly for the rest of the day.  You can resume normal activity the day after the procedure however you should NOT DRIVE or use heavy machinery for 24 hours (because of the sedation medicines used during the test).    SYMPTOMS TO REPORT IMMEDIATELY: A gastroenterologist can be reached at any hour.  During normal business hours, 8:30 AM to 5:00 PM Monday through Friday,  call [phone].  After hours and on weekends, please call the GI answering service at [phone] who will take a message and have the physician on call contact you.   Following lower endoscopy (colonoscopy or flexible sigmoidoscopy):  Excessive amounts of blood in the stool  Significant tenderness or worsening of abdominal pains  Swelling of the abdomen that is new, acute  Fever of 100F or higher  Following upper endoscopy (EGD)  Vomiting of blood or coffee ground material  New chest pain or pain under the shoulder blades  Painful or persistently difficult swallowing  New shortness of breath  Fever of 100F or higher  Black, tarry-looking stools  FOLLOW UP: If any biopsies were taken you will be contacted by phone or by letter within the next 1-3 weeks.  Call your gastroenterologist if you have not heard about the biopsies in 3 weeks.  Our staff will call the home number listed on your records the next business day following your procedure to check on you and address any questions or concerns that you may have at that time regarding the information given to you following your procedure. This is a courtesy call and so if there is no answer at the home number and we have not heard from you through the emergency physician on call, we will assume that you have returned to your regular daily activities without incident.  SIGNATURES/CONFIDENTIALITY: You and/or your care   partner have signed paperwork which will be entered into your electronic medical record.  These signatures attest to the fact that that the information above on your After Visit Summary has been reviewed and is understood.  Full responsibility of the confidentiality of this discharge information lies with you and/or your care-partner.  

## 2012-03-17 NOTE — Progress Notes (Signed)
Patient did not experience any of the following events: a burn prior to discharge; a fall within the facility; wrong site/side/patient/procedure/implant event; or a hospital transfer or hospital admission upon discharge from the facility. (G8907) Patient did not have preoperative order for IV antibiotic SSI prophylaxis. (G8918)  

## 2012-03-17 NOTE — Op Note (Signed)
[facility] [address] [place], [address]   ENDOSCOPY PROCEDURE REPORT  PATIENT: Debra White, Debra White  MR#: [id number] BIRTHDATE: 11/17/1933 , 78  yrs. old GENDER: Female ENDOSCOPIST:David Hale Bogus, MD, [facility] REFERRED BY: Lucky Cowboy, M.D. PROCEDURE DATE:  03/17/2012 PROCEDURE:   EGD w/ biopsy for H.pylori and EGD w/ biopsy ASA CLASS:    Class II INDICATIONS: heartburn. MEDICATION: There was residual sedation effect present from prior procedure and Fentanyl 150 mcg IV TOPICAL ANESTHETIC:   Cetacaine Spray  DESCRIPTION OF PROCEDURE:   After the risks and benefits of the procedure were explained, informed consent was obtained.  The LB GIF-H180 [id number]  endoscope was introduced through the mouth  and advanced to the second portion of the duodenum .  The instrument was slowly withdrawn as the mucosa was fully examined.    The upper, middle and distal third of the esophagus were carefully inspected and no abnormalities were noted.  The z-line was well seen at the GEJ.  The endoscope was pushed into the fundus which was normal including a retroflexed view.  The antrum, gastric body, first and second part of the duodenum were unremarkable.  Multiple biopsies were performed. of the duodenum and also CLO Bx. done. Retroflexed views revealed no abnormalities.    The scope was then withdrawn from the patient and the procedure completed.  COMPLICATIONS: There were no complications.   ENDOSCOPIC IMPRESSION: Normal EGD; multiple biopsies...r/o celiac disease,H.pylori...chronic diarrhea workup in progress.  RECOMMENDATIONS: 1.  await biopsy results 2.  continue current medications    _______________________________ eSigned:  Mardella Layman, MD, [facility] 03/17/2012 2:49 PM   standard discharge

## 2012-03-17 NOTE — Op Note (Signed)
[facility] [address] [place], [address]   COLONOSCOPY PROCEDURE REPORT  PATIENT: Debra, White  MR#: [id number] BIRTHDATE: 14-Dec-1933 , 78  yrs. old GENDER: Female ENDOSCOPIST: Mardella Layman, MD, [facility] REFERRED BY: PROCEDURE DATE:  03/17/2012 PROCEDURE:   Colonoscopy with biopsy ASA CLASS:   Class II INDICATIONS:patient's immediate family history of colon cancer and chronic diarrhea. MEDICATIONS: propofol (Diprivan) 150mg  IV  DESCRIPTION OF PROCEDURE:   After the risks and benefits and of the procedure were explained, informed consent was obtained.  A digital rectal exam revealed no abnormalities of the rectum.    The LB CF-H180AL [id number]  endoscope was introduced through the anus and advanced to the cecum, which was identified by both the appendix and ileocecal valve .  The quality of the prep was good, using MoviPrep .  The instrument was then slowly withdrawn as the colon was fully examined.     COLON FINDINGS: Severe diverticulosis was noted in the descending colon and sigmoid colon.   A diminutive small sessile polyp was found in the sigmoid colon.  A biopsy was performed using a cold snare.   RANDOM BIOPSIES EVERY 10 CM.  DONE.     Retroflexed views revealed external hemorrhoids.     The scope was then withdrawn from the patient and the procedure completed.  COMPLICATIONS: There were no complications. ENDOSCOPIC IMPRESSION: 1.   Severe diverticulosis was noted in the descending colon and sigmoid colon 2.   Diminutive small sessile polyp was found in the sigmoid colon; biopsy was performed using a cold snare 3.   RANDOM BIOPSIES EVERY 10 CM.  DONE.... 4.   R/O MICROSCOPIC/COLLAGENOUS COLITIS  RECOMMENDATIONS: 1.  await pathology results 2.  Repeat colonoscopy in 5 years if polyp adenomatous; otherwise 10 years   REPEAT EXAM:  cc:  _______________________________ eSignedMardella Layman, MD, [facility] 03/17/2012 2:42  PM     PATIENT NAME:  Debra White, Debra White MR#: [id number]

## 2012-03-17 NOTE — Progress Notes (Signed)
PATIENT COMPLAINS THAT SHE IS NAUSEATED ALL THE TIME, BELCHING AND DIARRHEA. PATIENT REPEATING PROBLEMS, DENIES SCORABLE PAIN AT PRESENT. PATIENT BELCHING, SMALL AMOUNT OF PHELYM EXPECTORATED. DR. Jarold Motto IN TO SPEAK WITH PATIENT AGAIN. REASSURED PATIENT.

## 2012-03-18 ENCOUNTER — Encounter: Payer: Self-pay | Admitting: Gastroenterology

## 2012-03-18 ENCOUNTER — Telehealth: Payer: Self-pay | Admitting: *Deleted

## 2012-03-18 LAB — HELICOBACTER PYLORI SCREEN-BIOPSY: UREASE: NEGATIVE

## 2012-03-18 NOTE — Telephone Encounter (Signed)
  Follow up Call-  Call back number 03/17/2012 09/24/2011  Post procedure Call Back phone  # [phone]  [phone]  Permission to leave phone message Yes Yes     Patient questions:  Do you have a fever, pain , or abdominal swelling? no Pain Score  0 *  Have you tolerated food without any problems? yes  Have you been able to return to your normal activities? yes  Do you have any questions about your discharge instructions: Diet   no Medications  no Follow up visit  no  Do you have questions or concerns about your Care? no  Actions: * If pain score is 4 or above: No action needed, pain <4.

## 2012-03-31 ENCOUNTER — Encounter: Payer: Self-pay | Admitting: Internal Medicine

## 2012-06-27 ENCOUNTER — Other Ambulatory Visit (HOSPITAL_COMMUNITY): Payer: Self-pay | Admitting: Internal Medicine

## 2012-06-27 DIAGNOSIS — Z1231 Encounter for screening mammogram for malignant neoplasm of breast: Secondary | ICD-10-CM

## 2012-07-10 ENCOUNTER — Ambulatory Visit (HOSPITAL_COMMUNITY)
Admission: RE | Admit: 2012-07-10 | Discharge: 2012-07-10 | Disposition: A | Discharge status: Home / Self Care | Payer: Medicare Other | Source: Ambulatory Visit | Attending: Internal Medicine | Admitting: Internal Medicine

## 2012-07-10 ENCOUNTER — Other Ambulatory Visit (HOSPITAL_COMMUNITY): Payer: Self-pay | Admitting: Internal Medicine

## 2012-07-10 DIAGNOSIS — I10 Essential (primary) hypertension: Secondary | ICD-10-CM

## 2012-07-10 DIAGNOSIS — J438 Other emphysema: Secondary | ICD-10-CM | POA: Insufficient documentation

## 2012-07-10 DIAGNOSIS — Z1231 Encounter for screening mammogram for malignant neoplasm of breast: Secondary | ICD-10-CM

## 2012-07-10 DIAGNOSIS — R634 Abnormal weight loss: Secondary | ICD-10-CM | POA: Insufficient documentation

## 2012-07-16 ENCOUNTER — Ambulatory Visit (INDEPENDENT_AMBULATORY_CARE_PROVIDER_SITE_OTHER): Payer: Self-pay | Admitting: General Surgery

## 2012-07-16 ENCOUNTER — Ambulatory Visit (INDEPENDENT_AMBULATORY_CARE_PROVIDER_SITE_OTHER): Payer: Medicare Other | Admitting: Surgery

## 2012-07-16 ENCOUNTER — Encounter (INDEPENDENT_AMBULATORY_CARE_PROVIDER_SITE_OTHER): Payer: Self-pay | Admitting: Surgery

## 2012-07-16 VITALS — BP 122/60 | HR 60 | Resp 16 | Ht 64.0 in | Wt 101.6 lb

## 2012-07-16 DIAGNOSIS — L089 Local infection of the skin and subcutaneous tissue, unspecified: Secondary | ICD-10-CM

## 2012-07-16 DIAGNOSIS — L723 Sebaceous cyst: Secondary | ICD-10-CM

## 2012-07-16 NOTE — Progress Notes (Signed)
Urgent office on 07/16/12. The patient comes in with an infected sebaceous cyst of the right groin. Apparently this was fairly large and spontaneously ruptured. He drank some infection. She still is a small knot in this area. She completed a course of antibiotics.We do not have any records from the referring physician.  On examination she has a 1 cm raised erythematous area. On palpation there is some purulent drainage from this area. We prepped with Betadine And anesthetized with 1% lidocaine. I made a 1 cm incision. We debrided some of the wall of the sebaceous cyst out of the wound. The wound is too small for packing. She should treat this area with Neosporin and dry dressing. Followup as needed.  Wilmon Arms. Corliss Skains, MD, [facility]  07/16/2012 2:53 PM

## 2012-07-16 NOTE — Patient Instructions (Signed)
Antibiotic ointment (neosporin) and a band-aid over the wound until it is healed. You may shower in 24 hours. Follow-up as needed.

## 2012-07-24 ENCOUNTER — Encounter (INDEPENDENT_AMBULATORY_CARE_PROVIDER_SITE_OTHER): Payer: Self-pay | Admitting: General Surgery

## 2012-07-24 ENCOUNTER — Ambulatory Visit (INDEPENDENT_AMBULATORY_CARE_PROVIDER_SITE_OTHER): Payer: Medicare Other | Admitting: General Surgery

## 2012-07-24 VITALS — BP 110/78 | HR 56 | Temp 97.4°F | Resp 16 | Ht 64.0 in | Wt 101.6 lb

## 2012-07-24 DIAGNOSIS — L723 Sebaceous cyst: Secondary | ICD-10-CM

## 2012-07-24 NOTE — Progress Notes (Signed)
Patient ID: Debra White, female   DOB: 1933-12-20, 79 y.o.   MRN: [id number] The patient is a 79 year old female with previously seen by Dr. Corliss Skains in urgent clinic. The patient had an incision and drainage of a small sebaceous cyst. The patient  Was concern that the area had recurred. She's had no erythema or drainage from the area. She complains of no fevers or chills wound.  On exam: Previous incision was clean dry and intact There is no erythema or drainage The area of induration underneath the incision site is likely to scar tissue  Assessment and plan: Healed sebaceous cyst incision and drainage -Patient followup when necessary

## 2012-08-13 ENCOUNTER — Other Ambulatory Visit: Payer: Self-pay | Admitting: Orthopedic Surgery

## 2012-08-13 DIAGNOSIS — M4800 Spinal stenosis, site unspecified: Secondary | ICD-10-CM

## 2012-08-15 ENCOUNTER — Ambulatory Visit
Admission: RE | Admit: 2012-08-15 | Discharge: 2012-08-15 | Disposition: A | Discharge status: Home / Self Care | Payer: Medicare Other | Source: Ambulatory Visit | Attending: Orthopedic Surgery | Admitting: Orthopedic Surgery

## 2012-08-15 DIAGNOSIS — M4800 Spinal stenosis, site unspecified: Secondary | ICD-10-CM

## 2012-08-21 ENCOUNTER — Other Ambulatory Visit: Payer: Medicare Other

## 2012-09-02 ENCOUNTER — Inpatient Hospital Stay (HOSPITAL_COMMUNITY)
Admission: EM | Admit: 2012-09-02 | Discharge: 2012-09-08 | DRG: 391 | Disposition: A | Discharge status: Home / Self Care | Payer: Medicare Other | Attending: Internal Medicine | Admitting: Internal Medicine

## 2012-09-02 ENCOUNTER — Emergency Department (HOSPITAL_COMMUNITY): Payer: Medicare Other

## 2012-09-02 ENCOUNTER — Encounter (HOSPITAL_COMMUNITY): Payer: Self-pay | Admitting: Emergency Medicine

## 2012-09-02 DIAGNOSIS — K630 Abscess of intestine: Secondary | ICD-10-CM | POA: Diagnosis present

## 2012-09-02 DIAGNOSIS — F411 Generalized anxiety disorder: Secondary | ICD-10-CM

## 2012-09-02 DIAGNOSIS — S02402S Zygomatic fracture, unspecified, sequela: Secondary | ICD-10-CM

## 2012-09-02 DIAGNOSIS — K219 Gastro-esophageal reflux disease without esophagitis: Secondary | ICD-10-CM | POA: Diagnosis present

## 2012-09-02 DIAGNOSIS — E43 Unspecified severe protein-calorie malnutrition: Secondary | ICD-10-CM | POA: Diagnosis present

## 2012-09-02 DIAGNOSIS — R110 Nausea: Secondary | ICD-10-CM

## 2012-09-02 DIAGNOSIS — Z808 Family history of malignant neoplasm of other organs or systems: Secondary | ICD-10-CM

## 2012-09-02 DIAGNOSIS — IMO0001 Reserved for inherently not codable concepts without codable children: Secondary | ICD-10-CM | POA: Diagnosis present

## 2012-09-02 DIAGNOSIS — K3189 Other diseases of stomach and duodenum: Secondary | ICD-10-CM | POA: Diagnosis not present

## 2012-09-02 DIAGNOSIS — D649 Anemia, unspecified: Secondary | ICD-10-CM

## 2012-09-02 DIAGNOSIS — L089 Local infection of the skin and subcutaneous tissue, unspecified: Secondary | ICD-10-CM

## 2012-09-02 DIAGNOSIS — R634 Abnormal weight loss: Secondary | ICD-10-CM | POA: Diagnosis present

## 2012-09-02 DIAGNOSIS — E871 Hypo-osmolality and hyponatremia: Secondary | ICD-10-CM | POA: Diagnosis present

## 2012-09-02 DIAGNOSIS — K5732 Diverticulitis of large intestine without perforation or abscess without bleeding: Principal | ICD-10-CM | POA: Diagnosis present

## 2012-09-02 DIAGNOSIS — K560 Paralytic ileus: Secondary | ICD-10-CM | POA: Diagnosis not present

## 2012-09-02 DIAGNOSIS — Z9181 History of falling: Secondary | ICD-10-CM

## 2012-09-02 DIAGNOSIS — Z681 Body mass index (BMI) 19 or less, adult: Secondary | ICD-10-CM

## 2012-09-02 DIAGNOSIS — Z8719 Personal history of other diseases of the digestive system: Secondary | ICD-10-CM

## 2012-09-02 DIAGNOSIS — G44329 Chronic post-traumatic headache, not intractable: Secondary | ICD-10-CM

## 2012-09-02 DIAGNOSIS — R197 Diarrhea, unspecified: Secondary | ICD-10-CM | POA: Diagnosis present

## 2012-09-02 DIAGNOSIS — M129 Arthropathy, unspecified: Secondary | ICD-10-CM | POA: Diagnosis present

## 2012-09-02 DIAGNOSIS — I10 Essential (primary) hypertension: Secondary | ICD-10-CM

## 2012-09-02 DIAGNOSIS — T50905A Adverse effect of unspecified drugs, medicaments and biological substances, initial encounter: Secondary | ICD-10-CM

## 2012-09-02 DIAGNOSIS — R112 Nausea with vomiting, unspecified: Secondary | ICD-10-CM

## 2012-09-02 DIAGNOSIS — E785 Hyperlipidemia, unspecified: Secondary | ICD-10-CM | POA: Diagnosis present

## 2012-09-02 DIAGNOSIS — K5792 Diverticulitis of intestine, part unspecified, without perforation or abscess without bleeding: Secondary | ICD-10-CM

## 2012-09-02 LAB — COMPREHENSIVE METABOLIC PANEL
ALT: 15 U/L (ref 0–35)
AST: 14 U/L (ref 0–37)
Albumin: 3.5 g/dL (ref 3.5–5.2)
Alkaline Phosphatase: 71 U/L (ref 39–117)
Chloride: 89 mEq/L — ABNORMAL LOW (ref 96–112)
Potassium: 4 mEq/L (ref 3.5–5.1)
Sodium: 126 mEq/L — ABNORMAL LOW (ref 135–145)
Total Bilirubin: 0.4 mg/dL (ref 0.3–1.2)

## 2012-09-02 LAB — CBC WITH DIFFERENTIAL/PLATELET
Basophils Absolute: 0 10*3/uL (ref 0.0–0.1)
Basophils Relative: 0 % (ref 0–1)
MCHC: 35.4 g/dL (ref 30.0–36.0)
Neutro Abs: 7.6 10*3/uL (ref 1.7–7.7)
Neutrophils Relative %: 73 % (ref 43–77)
RDW: 12.3 % (ref 11.5–15.5)
WBC: 10.3 10*3/uL (ref 4.0–10.5)

## 2012-09-02 LAB — URINALYSIS, ROUTINE W REFLEX MICROSCOPIC
Bilirubin Urine: NEGATIVE
Glucose, UA: NEGATIVE mg/dL
Ketones, ur: NEGATIVE mg/dL
pH: 5.5 (ref 5.0–8.0)

## 2012-09-02 LAB — URINE MICROSCOPIC-ADD ON

## 2012-09-02 MED ORDER — DEXTROSE-NACL 5-0.9 % IV SOLN
INTRAVENOUS | Status: DC
  Administered 2012-09-02 – 2012-09-05 (×5): via INTRAVENOUS

## 2012-09-02 MED ORDER — IOHEXOL 300 MG/ML  SOLN
25.0000 mL | INTRAMUSCULAR | Status: AC
Start: 2012-09-02 — End: 2012-09-02
  Administered 2012-09-02 (×2): 25 mL via ORAL

## 2012-09-02 MED ORDER — VITAMIN D3 25 MCG (1000 UNIT) PO TABS
2000.0000 [IU] | ORAL_TABLET | Freq: Every day | ORAL | Status: DC
  Administered 2012-09-03 – 2012-09-05 (×4): 2000 [IU] via ORAL
  Filled 2012-09-02 (×5): qty 2

## 2012-09-02 MED ORDER — ONDANSETRON HCL 4 MG/2ML IJ SOLN
4.0000 mg | Freq: Once | INTRAMUSCULAR | Status: AC
  Administered 2012-09-02: 4 mg via INTRAVENOUS
  Filled 2012-09-02: qty 2

## 2012-09-02 MED ORDER — LORAZEPAM 2 MG/ML IJ SOLN
0.5000 mg | Freq: Once | INTRAMUSCULAR | Status: AC
  Administered 2012-09-02: 0.5 mg via INTRAVENOUS
  Filled 2012-09-02: qty 1

## 2012-09-02 MED ORDER — SODIUM CHLORIDE 0.9 % IV SOLN
Freq: Once | INTRAVENOUS | Status: AC
  Administered 2012-09-02: 12:00:00 via INTRAVENOUS

## 2012-09-02 MED ORDER — ONDANSETRON HCL 4 MG/2ML IJ SOLN: 4.0000 mg | INTRAMUSCULAR | Status: AC | PRN

## 2012-09-02 MED ORDER — ATENOLOL 100 MG PO TABS
100.0000 mg | ORAL_TABLET | Freq: Every day | ORAL | Status: DC
  Filled 2012-09-02: qty 1

## 2012-09-02 MED ORDER — IOHEXOL 300 MG/ML  SOLN
80.0000 mL | Freq: Once | INTRAMUSCULAR | Status: AC | PRN
  Administered 2012-09-02: 75 mL via INTRAVENOUS

## 2012-09-02 MED ORDER — MORPHINE SULFATE 4 MG/ML IJ SOLN
4.0000 mg | Freq: Once | INTRAMUSCULAR | Status: AC
  Administered 2012-09-02: 4 mg via INTRAVENOUS
  Filled 2012-09-02: qty 1

## 2012-09-02 MED ORDER — ARTIFICIAL TEARS OP OINT
TOPICAL_OINTMENT | Freq: Every evening | OPHTHALMIC | Status: DC | PRN
  Filled 2012-09-02: qty 3.5

## 2012-09-02 MED ORDER — ALPRAZOLAM 0.5 MG PO TABS
0.5000 mg | ORAL_TABLET | Freq: Three times a day (TID) | ORAL | Status: DC | PRN
Start: 2012-09-02 — End: 2012-09-06
  Administered 2012-09-03 (×2): 1 mg via ORAL
  Administered 2012-09-03 – 2012-09-04 (×3): 0.5 mg via ORAL
  Administered 2012-09-04: 1 mg via ORAL
  Administered 2012-09-05 – 2012-09-06 (×3): 0.5 mg via ORAL
  Filled 2012-09-02 (×5): qty 1
  Filled 2012-09-02: qty 2
  Filled 2012-09-02 (×5): qty 1

## 2012-09-02 MED ORDER — MORPHINE SULFATE 4 MG/ML IJ SOLN: 8.0000 mg | INTRAMUSCULAR | Status: DC | PRN

## 2012-09-02 MED ORDER — MORPHINE SULFATE 2 MG/ML IJ SOLN
1.0000 mg | INTRAMUSCULAR | Status: DC | PRN
  Administered 2012-09-02 – 2012-09-05 (×4): 1 mg via INTRAVENOUS
  Filled 2012-09-02 (×5): qty 1

## 2012-09-02 MED ORDER — SODIUM CHLORIDE 0.9 % IJ SOLN
3.0000 mL | Freq: Two times a day (BID) | INTRAMUSCULAR | Status: DC
  Administered 2012-09-03 – 2012-09-04 (×2): 3 mL via INTRAVENOUS

## 2012-09-02 MED ORDER — LOSARTAN POTASSIUM 50 MG PO TABS
100.0000 mg | ORAL_TABLET | Freq: Every day | ORAL | Status: DC
  Filled 2012-09-02 (×2): qty 2

## 2012-09-02 MED ORDER — PROMETHAZINE HCL 25 MG PO TABS
12.5000 mg | ORAL_TABLET | Freq: Four times a day (QID) | ORAL | Status: DC | PRN
  Administered 2012-09-04 – 2012-09-06 (×4): 12.5 mg via ORAL
  Filled 2012-09-02 (×4): qty 1

## 2012-09-02 NOTE — H&P (Signed)
[facility] History and Physical  Debra White JYN:[id number] DOB: Sep 18, 1933 DOA: 09/02/2012  Referring physician: Ignacia Palma PCP: Nadean Corwin, MD  Specialists: Surgery  Chief Complaint: Abdominal  HPI: Debra White is a 79 y.o. yr olf CF-she states that on Frioday morning she started to have some severe abd pain-The apin was in the lower abdomen and Dr. Marcella Dubs was not abkle to see her-SHe was Rx a Rx of Cipro and FLagyl and this didn't  Have issues keeping them down but this didn;t seem to help.  She reports diarrhoea lasty night which was dakr but not bloody [?].  She reprts having diverticulosis in the past SHe has had  Bowel issues over this past year and has had toruble with her stomach-she is intolerant to meals late at night as well SHe has lost 30 pounds in a year-it was thought 2/2 to a fall recnetly this [place] rthat she has lost her sense of tast and smeell She has had abdominal pain and morphine seemd to help in ED She was taking Tramadol at home 25 mg q4 hr at home with mild comfort  Review of Systems:  No CP, Nausea initally but none now-she had a couple of epsidoes of vomiting. + loose stools-not watery, no rSH, thinsk she had some mild fever, NO SOB, no cough, no sick contacts.  She has an irregular heartbeat   Past Medical History  Diagnosis Date  . Hypertension   . Hyperlipemia   . Fibromyalgia   . Anxiety   . Dysrhythmia     RX  . Blood transfusion   . Chronic kidney disease     STONES  . GERD (gastroesophageal reflux disease)   . Headache   . Arthritis   . Cataract   . Diverticulosis of colon (without mention of hemorrhage) 2007    Colonoscopy  . Candida esophagitis 2013    EGD  . Family history of colon cancer     sister  . Emphysema of lung    charb review Seen by general surgery 07/16/2012 to for infected sebaceous cyst right groin-generalized followup recommended Last endoscopy 09/23/2028 showed Candida esophagitis and  possible Hpylori Colonoscopy 03/16/2012 showed severe diverticulosis and descending colon and sigmoid, diminutive small sessile polyp  Past Surgical History  Procedure Laterality Date  . Abdominal hysterectomy    . Hand surgery      BIL   . Shoulder arthroscopy w/ rotator cuff repair      LFT  . Cervical disc surgery    . Back surgery      X2  . Orif tripod fracture  07/13/2011    Procedure: OPEN REDUCTION INTERNAL FIXATION (ORIF) TRIPOD FRACTURE;  Surgeon: [name], MD;  Location: [facility] OR;  Service: ENT;  Laterality: Right;  ORIF RIGHT ZYGOMA, ORBITAL FLOOR EXPLORATION WITH FROST STITCH (TEMPORARY TARSORRHAPHY)  . Colonoscopy    . Facial fracture surgery    . Back surgery     Social History:  reports that she has never smoked. She has never used smokeless tobacco. She reports that she does not drink alcohol or use illicit drugs.   Allergies  Allergen Reactions  . Latex Hives    TONGUE HAD BLISTERS WHEN HAD DENTAL SURGERY  . Amoxicillin Nausea And Vomiting  . Chlorhexidine Gluconate Itching  . Aspirin     Gi upset  . Codeine Itching  . Prochlorperazine Other (See Comments)    Uncontrolled shaking  . Betadine (Povidone Iodine) Rash  . Caffeine Palpitations  Family History  Problem Relation Age of Onset  . Heart disease Mother   . Breast cancer Sister   . Colon cancer Sister   . Cancer Sister     breast  . Cancer Sister     colon  . Cancer Sister     colon   Prior to Admission medications   Medication Sig Start Date End Date Taking? Authorizing Provider  ALPRAZolam Prudy Feeler) 1 MG tablet Take 0.5-1 mg by mouth 3 (three) times daily as needed. For anxiety   Yes Historical Provider, MD  atenolol (TENORMIN) 100 MG tablet Take 100 mg by mouth every morning.    Yes Historical Provider, MD  cholecalciferol (VITAMIN D) 1000 UNITS tablet Take 2,000 Units by mouth daily.   Yes Historical Provider, MD  ciprofloxacin (CIPRO) 500 MG tablet Take 500 mg by mouth 2 (two) times  daily. Starting 08/29/12 for 10 days   Yes Historical Provider, MD  hyoscyamine (LEVSIN SL) 0.125 MG SL tablet Place 0.125 mg under the tongue every 4 (four) hours as needed for cramping (nausea).   Yes Historical Provider, MD  losartan (COZAAR) 100 MG tablet Take 100 mg by mouth every evening.   Yes Historical Provider, MD  metroNIDAZOLE (FLAGYL) 500 MG tablet Take 500 mg by mouth 3 (three) times daily. Starting 08/29/12 for 10 days   Yes Historical Provider, MD  Multiple Vitamins-Minerals (MULTIVITAMINS THER. W/MINERALS) TABS Take 1 tablet by mouth daily.   Yes Historical Provider, MD  pantoprazole (PROTONIX) 40 MG tablet Take 40 mg by mouth daily.   Yes Historical Provider, MD  pravastatin (PRAVACHOL) 40 MG tablet Take 40 mg by mouth daily.   Yes Historical Provider, MD   Physical Exam: Filed Vitals:   09/02/12 1215 09/02/12 1312 09/02/12 1330 09/02/12 1445  BP:  143/53 138/65   Pulse: 58 57 56 58  Temp:  98.2 F (36.8 C)    TempSrc:  Oral    Resp: 20 18 16 21   SpO2: 100% 100% 100% 97%     General:  A pleasant oriented no apparent distress  Eyes: Pallor no icterus  ENT: Poor dentition however she still present, mucosa slightly dry  Neck: Soft supple mild JVD  Cardiovascular: S1-S2 no murmur rub or help  Respiratory: Clinically clear  Abdomen: Soft but tender in left lower quadrant cyst mild tenderness in the umbilical  Skin: No lower extremity edema  Musculoskeletal: Range of motion intact  Psychiatric: Euthymic but anxious  Neurologic: Grossly intact moves all 4 extremities equally  Labs on Admission:  Basic Metabolic Panel:  Recent Labs Lab 09/02/12 1205  NA 126*  K 4.0  CL 89*  CO2 25  GLUCOSE 96  BUN 12  CREATININE 0.69  CALCIUM 9.0   Liver Function Tests:  Recent Labs Lab 09/02/12 1205  AST 14  ALT 15  ALKPHOS 71  BILITOT 0.4  PROT 7.2  ALBUMIN 3.5    Recent Labs Lab 09/02/12 1205  LIPASE 41   No results found for this basename: AMMONIA,   in the last 168 hours CBC:  Recent Labs Lab 09/02/12 1205  WBC 10.3  NEUTROABS 7.6  HGB 10.9*  HCT 30.8*  MCV 90.3  PLT 259   Cardiac Enzymes: No results found for this basename: CKTOTAL, CKMB, CKMBINDEX, TROPONINI,  in the last 168 hours  BNP (last 3 results) No results found for this basename: PROBNP,  in the last 8760 hours CBG: No results found for this basename: GLUCAP,  in the  last 168 hours  Radiological Exams on Admission: Ct Abdomen Pelvis W Contrast  09/02/2012  *RADIOLOGY REPORT*  Clinical Data: Lower abdominal pain.  Diverticulitis.  CT ABDOMEN AND PELVIS WITH CONTRAST  Technique:  Multidetector CT imaging of the abdomen and pelvis was performed following the standard protocol during bolus administration of intravenous contrast.  Contrast: 75mL OMNIPAQUE IOHEXOL 300 MG/ML  SOLN  Comparison: 12/21/2011  Findings: Increased bibasilar atelectasis.  Liver, gallbladder, spleen, kidneys, pancreas are stable.  Wall thickening of the sigmoid colon and surrounding fatty stranding has increased.  There is a 3.1 x 1.7 cm air and fluid- filled abscess just inferior to the sigmoid colon, deep in the left hemi pelvis, to the left of the bladder.  See image 69.  A few air bubbles anterior to the main abscess may represent a few locules of extraluminal bowel gas in an adjacent tiny abscess.  There is otherwise no evidence of extraluminal bowel gas.  There is no second abscess.  Trace free fluid in the right posterior hemi pelvis.  Stable bladder.  Uterus absent.  Stable lumbar spine with postoperative changes and fusion.  Stable atherosclerotic changes involving the aorta and visceral vasculature.  Stable retroaortic left renal vein.  IMPRESSION: Interval development of acute diverticulitis and a small 3.1 cm abscess. Given the location, drainage of this abscess would be extremely difficult percutaneously.   Original Report Authenticated By: Jolaine Click, M.D.     EKG: Independently reviewed.  Sinus rhythm PR interval 0.12 QRS axis about 2030 no ST-T wave  Assessment/Plan Active Problems:   * No active hospital problems. *   1. Acute diverticulitis with 1.4 cm abscess-general surgery was consulted by the emergency room and their opinion was that this was a nonsurgical issue-30% of patients with complicated diverticulitis may require surgery, 41% do not and 20% have some persistence. As abscess a smaller than 3cm this may not need surgery .as she has failed outpatient management with ciprofloxacin and Flagyl we will transition her to Levaquin IV as well as Flagyl IV-continue morphine for pain management 2 mg every 2 when necessary, n.p.o., IV fluid 75 cc per hour D5 saline 2. Hypertension-patient will continue ARB of choice 3. Anxiety patient to continue Xanax 0.5-1 mg 3 times a day when necessary anxiety 4. Recent colonoscopy showing diverticulosis 5. Lipidemia-hold statin for now  Surgery consult by emergency and declined admission  Time spent: 5  Mahala Menghini [facility] Pager [phone]  If 7PM-7AM, please contact night-coverage [email or web address] Password [facility] 09/02/2012, 5:16 PM

## 2012-09-02 NOTE — Consult Note (Signed)
Reason for Consult:Diverticulitis Referring Physician: Keiara Sneeringer is an 79 y.o. female.  HPI: asked to see pt at request of Dr Oneta Rack for abdominal pain for 4 days and CT evidence of diverticulitis with 3 cm abscess.  Pt has history of intermittent abdominal pain and extensive work up by GI medicine.  Friday developed severe LLQ abdominal pain and was placed on ABX by mouth.  Pain continued and she was sent by MD to ED.  Pain better but now involves both lower quadrants. 8/10.  Some nausea and vomiting.  Past Medical History  Diagnosis Date  . Hypertension   . Hyperlipemia   . Fibromyalgia   . Anxiety   . Dysrhythmia     RX  . Blood transfusion   . Chronic kidney disease     STONES  . GERD (gastroesophageal reflux disease)   . Headache   . Arthritis   . Cataract   . Diverticulosis of colon (without mention of hemorrhage) 2007    Colonoscopy  . Candida esophagitis 2013    EGD  . Family history of colon cancer     sister  . Emphysema of lung     Past Surgical History  Procedure Laterality Date  . Abdominal hysterectomy    . Hand surgery      BIL   . Shoulder arthroscopy w/ rotator cuff repair      LFT  . Cervical disc surgery    . Back surgery      X2  . Orif tripod fracture  07/13/2011    Procedure: OPEN REDUCTION INTERNAL FIXATION (ORIF) TRIPOD FRACTURE;  Surgeon: [name], MD;  Location: [facility] OR;  Service: ENT;  Laterality: Right;  ORIF RIGHT ZYGOMA, ORBITAL FLOOR EXPLORATION WITH FROST STITCH (TEMPORARY TARSORRHAPHY)  . Colonoscopy    . Facial fracture surgery    . Back surgery      Family History  Problem Relation Age of Onset  . Heart disease Mother   . Breast cancer Sister   . Colon cancer Sister   . Cancer Sister     breast  . Cancer Sister     colon  . Cancer Sister     colon    Social History:  reports that she has never smoked. She has never used smokeless tobacco. She reports that she does not drink alcohol or use illicit  drugs.  Allergies:  Allergies  Allergen Reactions  . Latex Hives    TONGUE HAD BLISTERS WHEN HAD DENTAL SURGERY  . Amoxicillin Nausea And Vomiting  . Chlorhexidine Gluconate Itching  . Aspirin     Gi upset  . Codeine Itching  . Prochlorperazine Other (See Comments)    Uncontrolled shaking  . Betadine (Povidone Iodine) Rash  . Caffeine Palpitations    Medications: I have reviewed the patient's current medications.  Results for orders placed during the hospital encounter of 09/02/12 (from the past 48 hour(s))  CBC WITH DIFFERENTIAL     Status: Abnormal   Collection Time    09/02/12 12:05 PM      Result Value Range   WBC 10.3  4.0 - 10.5 K/uL   RBC 3.41 (*) 3.87 - 5.11 MIL/uL   Hemoglobin 10.9 (*) 12.0 - 15.0 g/dL   HCT 04.5 (*) 40.9 - 81.1 %   MCV 90.3  78.0 - 100.0 fL   MCH 32.0  26.0 - 34.0 pg   MCHC 35.4  30.0 - 36.0 g/dL   RDW 12.3  11.5 - 15.5 %   Platelets 259  150 - 400 K/uL   Neutrophils Relative 73  43 - 77 %   Neutro Abs 7.6  1.7 - 7.7 K/uL   Lymphocytes Relative 12  12 - 46 %   Lymphs Abs 1.3  0.7 - 4.0 K/uL   Monocytes Relative 14 (*) 3 - 12 %   Monocytes Absolute 1.4 (*) 0.1 - 1.0 K/uL   Eosinophils Relative 1  0 - 5 %   Eosinophils Absolute 0.1  0.0 - 0.7 K/uL   Basophils Relative 0  0 - 1 %   Basophils Absolute 0.0  0.0 - 0.1 K/uL  COMPREHENSIVE METABOLIC PANEL     Status: Abnormal   Collection Time    09/02/12 12:05 PM      Result Value Range   Sodium 126 (*) 135 - 145 mEq/L   Potassium 4.0  3.5 - 5.1 mEq/L   Chloride 89 (*) 96 - 112 mEq/L   CO2 25  19 - 32 mEq/L   Glucose, Bld 96  70 - 99 mg/dL   BUN 12  6 - 23 mg/dL   Creatinine, Ser 1.61  0.50 - 1.10 mg/dL   Calcium 9.0  8.4 - 09.6 mg/dL   Total Protein 7.2  6.0 - 8.3 g/dL   Albumin 3.5  3.5 - 5.2 g/dL   AST 14  0 - 37 U/L   ALT 15  0 - 35 U/L   Alkaline Phosphatase 71  39 - 117 U/L   Total Bilirubin 0.4  0.3 - 1.2 mg/dL   GFR calc non Af Amer 82 (*) >90 mL/min   GFR calc Af Amer >90   >90 mL/min   Comment:            The eGFR has been calculated     using the CKD EPI equation.     This calculation has not been     validated in all clinical     situations.     eGFR's persistently     <90 mL/min signify     possible Chronic Kidney Disease.  LIPASE, BLOOD     Status: None   Collection Time    09/02/12 12:05 PM      Result Value Range   Lipase 41  11 - 59 U/L  URINALYSIS, ROUTINE W REFLEX MICROSCOPIC     Status: Abnormal   Collection Time    09/02/12  1:55 PM      Result Value Range   Color, Urine YELLOW  YELLOW   APPearance CLEAR  CLEAR   Specific Gravity, Urine 1.011  1.005 - 1.030   pH 5.5  5.0 - 8.0   Glucose, UA NEGATIVE  NEGATIVE mg/dL   Hgb urine dipstick NEGATIVE  NEGATIVE   Bilirubin Urine NEGATIVE  NEGATIVE   Ketones, ur NEGATIVE  NEGATIVE mg/dL   Protein, ur NEGATIVE  NEGATIVE mg/dL   Urobilinogen, UA 0.2  0.0 - 1.0 mg/dL   Nitrite NEGATIVE  NEGATIVE   Leukocytes, UA TRACE (*) NEGATIVE  URINE MICROSCOPIC-ADD ON     Status: Abnormal   Collection Time    09/02/12  1:55 PM      Result Value Range   Squamous Epithelial / LPF FEW (*) RARE   WBC, UA 0-2  <3 WBC/hpf   Bacteria, UA RARE  RARE    Ct Abdomen Pelvis W Contrast  09/02/2012  *RADIOLOGY REPORT*  Clinical Data: Lower abdominal pain.  Diverticulitis.  CT ABDOMEN AND PELVIS WITH CONTRAST  Technique:  Multidetector CT imaging of the abdomen and pelvis was performed following the standard protocol during bolus administration of intravenous contrast.  Contrast: 75mL OMNIPAQUE IOHEXOL 300 MG/ML  SOLN  Comparison: 12/21/2011  Findings: Increased bibasilar atelectasis.  Liver, gallbladder, spleen, kidneys, pancreas are stable.  Wall thickening of the sigmoid colon and surrounding fatty stranding has increased.  There is a 3.1 x 1.7 cm air and fluid- filled abscess just inferior to the sigmoid colon, deep in the left hemi pelvis, to the left of the bladder.  See image 69.  A few air bubbles anterior to the  main abscess may represent a few locules of extraluminal bowel gas in an adjacent tiny abscess.  There is otherwise no evidence of extraluminal bowel gas.  There is no second abscess.  Trace free fluid in the right posterior hemi pelvis.  Stable bladder.  Uterus absent.  Stable lumbar spine with postoperative changes and fusion.  Stable atherosclerotic changes involving the aorta and visceral vasculature.  Stable retroaortic left renal vein.  IMPRESSION: Interval development of acute diverticulitis and a small 3.1 cm abscess. Given the location, drainage of this abscess would be extremely difficult percutaneously.   Original Report Authenticated By: Jolaine Click, M.D.     Review of Systems  Constitutional: Positive for chills and weight loss.  Eyes: Negative.   Respiratory: Negative.   Cardiovascular: Negative.   Gastrointestinal: Positive for vomiting, abdominal pain and diarrhea.  Genitourinary: Negative.   Musculoskeletal: Negative.   Skin: Negative.   Neurological: Positive for headaches.  Endo/Heme/Allergies: Negative.   Psychiatric/Behavioral: Negative.    Blood pressure 138/65, pulse 58, temperature 98.2 F (36.8 C), temperature source Oral, resp. rate 21, SpO2 97.00%. Physical Exam  Constitutional: She is oriented to person, place, and time. She appears well-developed and well-nourished.  HENT:  Head: Normocephalic and atraumatic.  Eyes: Conjunctivae and EOM are normal.  Neck: Normal range of motion. Neck supple.  Cardiovascular: Normal rate and regular rhythm.   GI: There is tenderness in the right lower quadrant and left lower quadrant. There is rebound and guarding. There is no rigidity and no tenderness at McBurney's point. No hernia.  Musculoskeletal: Normal range of motion.  Neurological: She is alert and oriented to person, place, and time.  Skin: Skin is warm and dry.  Psychiatric: She has a normal mood and affect. Her behavior is normal. Judgment and thought content  normal.    Assessment/Plan: Acute diverticulitis with diverticular abscess.  No emergent surgical need Agree with IV abx Can have clears May need ex lap or laparoscopy with wash out vs colectomy and ostomy.  CCS to follow for now.  Doubt IR can drain this per their report.  CORNETT,THOMAS A. 09/02/2012, 6:06 PM

## 2012-09-02 NOTE — ED Notes (Signed)
Patient is requesting medication for stomach pain.

## 2012-09-02 NOTE — ED Notes (Signed)
Pt informed the need for a urine sample. Unable to provide one at this time.

## 2012-09-02 NOTE — ED Notes (Signed)
Notified CT that pt finished drinking contrast.

## 2012-09-02 NOTE — ED Provider Notes (Addendum)
History     CSN: [id number]  Arrival date & time 09/02/12  1112   First MD Initiated Contact with Patient 09/02/12 1152      Chief Complaint  Patient presents with  . Weakness  . Abdominal Pain    (Consider location/radiation/quality/duration/timing/severity/associated sxs/prior treatment) HPI Comments: Patient says she's been sick for a year, she fell on her face factors. She has been given a lot of antibiotic medicine to prevent infection in her face. More recently, she has had abdominal pain and diarrhea. She was seen by her Dalbert Garnet, M.D., who treated her with Cipro and Flagyl. He her again today, 3 days later, and she continues with lower abdominal pain and diarrhea and she he therefore sent her to the ED for evaluation. The patient notes a 20-30 pound weight loss over the past year.  Patient is a 79 y.o. female presenting with abdominal pain.  Abdominal Pain Pain location:  LLQ, RLQ and suprapubic Pain quality: cramping   Pain radiates to:  Does not radiate Pain severity:  Moderate Onset quality:  Gradual Duration:  4 days Timing:  Constant Progression:  Worsening Chronicity:  Recurrent Context comment:  Abdominal cramps and diarrhea, unresponsive to treatment with Cipro and Flagyl. Relieved by:  Nothing Worsened by:  Nothing tried Associated symptoms: diarrhea   Associated symptoms: no chills, no fever and no vomiting     Past Medical History  Diagnosis Date  . Hypertension   . Hyperlipemia   . Fibromyalgia   . Anxiety   . Dysrhythmia     RX  . Blood transfusion   . Chronic kidney disease     STONES  . GERD (gastroesophageal reflux disease)   . Headache   . Arthritis   . Cataract   . Diverticulosis of colon (without mention of hemorrhage) 2007    Colonoscopy  . Candida esophagitis 2013    EGD  . Family history of colon cancer     sister  . Emphysema of lung     Past Surgical History  Procedure Laterality Date  . Abdominal  hysterectomy    . Hand surgery      BIL   . Shoulder arthroscopy w/ rotator cuff repair      LFT  . Cervical disc surgery    . Back surgery      X2  . Orif tripod fracture  07/13/2011    Procedure: OPEN REDUCTION INTERNAL FIXATION (ORIF) TRIPOD FRACTURE;  Surgeon: [name], MD;  Location: [facility] OR;  Service: ENT;  Laterality: Right;  ORIF RIGHT ZYGOMA, ORBITAL FLOOR EXPLORATION WITH FROST STITCH (TEMPORARY TARSORRHAPHY)  . Colonoscopy    . Facial fracture surgery    . Back surgery      Family History  Problem Relation Age of Onset  . Heart disease Mother   . Breast cancer Sister   . Colon cancer Sister   . Cancer Sister     breast  . Cancer Sister     colon  . Cancer Sister     colon    History  Substance Use Topics  . Smoking status: Never Smoker   . Smokeless tobacco: Never Used  . Alcohol Use: No    OB History   Grav Para Term Preterm Abortions TAB SAB Ect Mult Living                  Review of Systems  Constitutional: Negative.  Negative for fever and chills.  HENT: Negative.  Eyes: Negative.   Respiratory: Negative.   Cardiovascular: Negative.   Gastrointestinal: Positive for abdominal pain and diarrhea. Negative for vomiting and blood in stool.  Genitourinary: Negative.   Musculoskeletal: Negative.   Skin: Negative.   Neurological: Negative.   Psychiatric/Behavioral: Negative.     Allergies  Latex; Amoxicillin; Chlorhexidine gluconate; Aspirin; Codeine; Prochlorperazine; Betadine; and Caffeine  Home Medications   Current Outpatient Rx  Name  Route  Sig  Dispense  Refill  . ALPRAZolam (XANAX) 1 MG tablet   Oral   Take 0.5-1 mg by mouth 3 (three) times daily as needed. For anxiety         . atenolol (TENORMIN) 100 MG tablet   Oral   Take 100 mg by mouth every morning.          . cholecalciferol (VITAMIN D) 1000 UNITS tablet   Oral   Take 2,000 Units by mouth daily.         . ciprofloxacin (CIPRO) 500 MG tablet   Oral   Take 500  mg by mouth 2 (two) times daily. Starting 08/29/12 for 10 days         . hyoscyamine (LEVSIN SL) 0.125 MG SL tablet   Sublingual   Place 0.125 mg under the tongue every 4 (four) hours as needed for cramping (nausea).         . losartan (COZAAR) 100 MG tablet   Oral   Take 100 mg by mouth every evening.         . metroNIDAZOLE (FLAGYL) 500 MG tablet   Oral   Take 500 mg by mouth 3 (three) times daily. Starting 08/29/12 for 10 days         . Multiple Vitamins-Minerals (MULTIVITAMINS THER. W/MINERALS) TABS   Oral   Take 1 tablet by mouth daily.         . pantoprazole (PROTONIX) 40 MG tablet   Oral   Take 40 mg by mouth daily.         . pravastatin (PRAVACHOL) 40 MG tablet   Oral   Take 40 mg by mouth daily.           BP 143/53  Pulse 57  Temp(Src) 98.2 F (36.8 C) (Oral)  Resp 18  SpO2 100%  Physical Exam  Nursing note and vitals reviewed. Constitutional: She is oriented to person, place, and time.  Slender elderly lady in mild to moderate distress with lower abdominal pain.  HENT:  Head: Normocephalic and atraumatic.  Right Ear: External ear normal.  Left Ear: External ear normal.  Mouth/Throat: Oropharyngeal exudate: mouth is dry.  Eyes: Conjunctivae and EOM are normal. Pupils are equal, round, and reactive to light. No scleral icterus.  Neck: Normal range of motion. Neck supple.  Cardiovascular: Normal rate, regular rhythm and normal heart sounds.   Pulmonary/Chest: Effort normal and breath sounds normal.  Abdominal: Soft.  She localizes pain to the lower abdomen and suprapubic region. There is mild tenderness to palpation but no mass rebound or rigidity.  Musculoskeletal: Normal range of motion. She exhibits no edema and no tenderness.  Neurological: She is alert and oriented to person, place, and time.  No sensory or motor deficit.  Skin: Skin is warm and dry.  Psychiatric: She has a normal mood and affect. Her behavior is normal.    ED Course   Procedures (including critical care time)  Labs Reviewed  CBC WITH DIFFERENTIAL - Abnormal; Notable for the following:    RBC 3.41 (*)  Hemoglobin 10.9 (*)    HCT 30.8 (*)    Monocytes Relative 14 (*)    Monocytes Absolute 1.4 (*)    All other components within normal limits  COMPREHENSIVE METABOLIC PANEL - Abnormal; Notable for the following:    Sodium 126 (*)    Chloride 89 (*)    GFR calc non Af Amer 82 (*)    All other components within normal limits  LIPASE, BLOOD  URINALYSIS, ROUTINE W REFLEX MICROSCOPIC    Date: 09/02/2012  Rate: 58  Rhythm: normal sinus rhythm  QRS Axis: normal  Intervals: normal  ST/T Wave abnormalities: normal  Conduction Disutrbances:none  Narrative Interpretation: Normal EKg  Old EKG Reviewed: none available  Patient was seen and had physical examination. Laboratory tests were ordered.  Results for orders placed during the hospital encounter of 09/02/12  CBC WITH DIFFERENTIAL      Result Value Range   WBC 10.3  4.0 - 10.5 K/uL   RBC 3.41 (*) 3.87 - 5.11 MIL/uL   Hemoglobin 10.9 (*) 12.0 - 15.0 g/dL   HCT 45.4 (*) 09.8 - 11.9 %   MCV 90.3  78.0 - 100.0 fL   MCH 32.0  26.0 - 34.0 pg   MCHC 35.4  30.0 - 36.0 g/dL   RDW 14.7  82.9 - 56.2 %   Platelets 259  150 - 400 K/uL   Neutrophils Relative 73  43 - 77 %   Neutro Abs 7.6  1.7 - 7.7 K/uL   Lymphocytes Relative 12  12 - 46 %   Lymphs Abs 1.3  0.7 - 4.0 K/uL   Monocytes Relative 14 (*) 3 - 12 %   Monocytes Absolute 1.4 (*) 0.1 - 1.0 K/uL   Eosinophils Relative 1  0 - 5 %   Eosinophils Absolute 0.1  0.0 - 0.7 K/uL   Basophils Relative 0  0 - 1 %   Basophils Absolute 0.0  0.0 - 0.1 K/uL  COMPREHENSIVE METABOLIC PANEL      Result Value Range   Sodium 126 (*) 135 - 145 mEq/L   Potassium 4.0  3.5 - 5.1 mEq/L   Chloride 89 (*) 96 - 112 mEq/L   CO2 25  19 - 32 mEq/L   Glucose, Bld 96  70 - 99 mg/dL   BUN 12  6 - 23 mg/dL   Creatinine, Ser 1.30  0.50 - 1.10 mg/dL   Calcium 9.0  8.4 -  86.5 mg/dL   Total Protein 7.2  6.0 - 8.3 g/dL   Albumin 3.5  3.5 - 5.2 g/dL   AST 14  0 - 37 U/L   ALT 15  0 - 35 U/L   Alkaline Phosphatase 71  39 - 117 U/L   Total Bilirubin 0.4  0.3 - 1.2 mg/dL   GFR calc non Af Amer 82 (*) >90 mL/min   GFR calc Af Amer >90  >90 mL/min  LIPASE, BLOOD      Result Value Range   Lipase 41  11 - 59 U/L  URINALYSIS, ROUTINE W REFLEX MICROSCOPIC      Result Value Range   Color, Urine YELLOW  YELLOW   APPearance CLEAR  CLEAR   Specific Gravity, Urine 1.011  1.005 - 1.030   pH 5.5  5.0 - 8.0   Glucose, UA NEGATIVE  NEGATIVE mg/dL   Hgb urine dipstick NEGATIVE  NEGATIVE   Bilirubin Urine NEGATIVE  NEGATIVE   Ketones, ur NEGATIVE  NEGATIVE mg/dL  Protein, ur NEGATIVE  NEGATIVE mg/dL   Urobilinogen, UA 0.2  0.0 - 1.0 mg/dL   Nitrite NEGATIVE  NEGATIVE   Leukocytes, UA TRACE (*) NEGATIVE  URINE MICROSCOPIC-ADD ON      Result Value Range   Squamous Epithelial / LPF FEW (*) RARE   WBC, UA 0-2  <3 WBC/hpf   Bacteria, UA RARE  RARE   3:34 PM Lab workup shows sodium low at 126.  CBC is normal.  UA is negative.  Waiting for abdominal CT.  4:33 PM CT of abdomen and pelvis shows acute diverticulitis with a microperforation that the radiologist says is not accessible to drainage by interventional radiology.  Will Call [facility] to admit her for IV antibiotic treatment.  1. Acute diverticulitis     5:08 PM Case discussed with Dr. Mahala Menghini of [facility], who requested that I contact general surgery about the pt.  I spoke to Dr. Derrell Lolling, general surgeon, who advised that pt would not be treated operatively at this point, and recommended treatment with IV antibiotics and admission by internal medicine.       Carleene Cooper III, MD 09/02/12 1637    Carleene Cooper III, MD 09/02/12 [phone]

## 2012-09-02 NOTE — ED Notes (Signed)
Pt c/o generalized weakness and bilateral lower abd pain, tenderness to touch. Pt reports she went to PCP a few times and was put on antibioctics that made her stomach hurt. Pt reports she just feels like she is dehydrated. Pt was at PCP today and he sent her over here to be admitted.

## 2012-09-02 NOTE — ED Notes (Signed)
Pharmacy tech at bedside 

## 2012-09-02 NOTE — ED Notes (Signed)
Patient requested and received a ginger ale.

## 2012-09-02 NOTE — ED Notes (Signed)
Pt denies n/v

## 2012-09-03 ENCOUNTER — Inpatient Hospital Stay (HOSPITAL_COMMUNITY): Payer: Medicare Other

## 2012-09-03 DIAGNOSIS — D649 Anemia, unspecified: Secondary | ICD-10-CM

## 2012-09-03 DIAGNOSIS — F411 Generalized anxiety disorder: Secondary | ICD-10-CM

## 2012-09-03 DIAGNOSIS — I10 Essential (primary) hypertension: Secondary | ICD-10-CM

## 2012-09-03 LAB — PROTIME-INR
INR: 1.13 (ref 0.00–1.49)
Prothrombin Time: 14.3 seconds (ref 11.6–15.2)

## 2012-09-03 LAB — COMPREHENSIVE METABOLIC PANEL
ALT: 13 U/L (ref 0–35)
AST: 12 U/L (ref 0–37)
Albumin: 3 g/dL — ABNORMAL LOW (ref 3.5–5.2)
CO2: 28 mEq/L (ref 19–32)
Chloride: 96 mEq/L (ref 96–112)
GFR calc non Af Amer: 79 mL/min — ABNORMAL LOW (ref >90)
Potassium: 4.1 mEq/L (ref 3.5–5.1)
Sodium: 132 mEq/L — ABNORMAL LOW (ref 135–145)
Total Bilirubin: 0.4 mg/dL (ref 0.3–1.2)

## 2012-09-03 LAB — CBC
Hemoglobin: 10.5 g/dL — ABNORMAL LOW (ref 12.0–15.0)
MCH: 32.5 pg (ref 26.0–34.0)
MCHC: 34.9 g/dL (ref 30.0–36.0)
MCV: 93.2 fL (ref 78.0–100.0)
Platelets: 255 10*3/uL (ref 150–400)

## 2012-09-03 LAB — GLUCOSE, CAPILLARY: Glucose-Capillary: 103 mg/dL — ABNORMAL HIGH (ref 70–99)

## 2012-09-03 MED ORDER — ACETAMINOPHEN 325 MG PO TABS
650.0000 mg | ORAL_TABLET | Freq: Four times a day (QID) | ORAL | Status: DC | PRN
  Administered 2012-09-03 – 2012-09-08 (×12): 650 mg via ORAL
  Filled 2012-09-03 (×13): qty 2

## 2012-09-03 MED ORDER — ATENOLOL 100 MG PO TABS
100.0000 mg | ORAL_TABLET | Freq: Every day | ORAL | Status: DC
  Administered 2012-09-04 – 2012-09-05 (×2): 100 mg via ORAL
  Filled 2012-09-03 (×3): qty 1

## 2012-09-03 MED ORDER — ACETAMINOPHEN 325 MG PO TABS
ORAL_TABLET | ORAL | Status: AC
  Filled 2012-09-03: qty 2

## 2012-09-03 MED ORDER — PANTOPRAZOLE SODIUM 40 MG PO TBEC
40.0000 mg | DELAYED_RELEASE_TABLET | Freq: Every day | ORAL | Status: DC
  Administered 2012-09-03 – 2012-09-05 (×3): 40 mg via ORAL
  Filled 2012-09-03 (×2): qty 1

## 2012-09-03 MED ORDER — SODIUM CHLORIDE 0.9 % IV SOLN
INTRAVENOUS | Status: AC | PRN
  Administered 2012-09-03: 50 mL/h via INTRAVENOUS

## 2012-09-03 MED ORDER — CIPROFLOXACIN IN D5W 400 MG/200ML IV SOLN
400.0000 mg | Freq: Two times a day (BID) | INTRAVENOUS | Status: DC
  Administered 2012-09-03 (×2): 400 mg via INTRAVENOUS
  Filled 2012-09-03 (×4): qty 200

## 2012-09-03 MED ORDER — METRONIDAZOLE IN NACL 5-0.79 MG/ML-% IV SOLN
500.0000 mg | Freq: Three times a day (TID) | INTRAVENOUS | Status: DC
  Administered 2012-09-03 – 2012-09-04 (×3): 500 mg via INTRAVENOUS
  Filled 2012-09-03 (×6): qty 100

## 2012-09-03 MED ORDER — FENTANYL CITRATE 0.05 MG/ML IJ SOLN
INTRAMUSCULAR | Status: AC
  Filled 2012-09-03: qty 4

## 2012-09-03 MED ORDER — ALUM & MAG HYDROXIDE-SIMETH 200-200-20 MG/5ML PO SUSP
30.0000 mL | Freq: Once | ORAL | Status: AC
  Administered 2012-09-03: 30 mL via ORAL
  Filled 2012-09-03: qty 30

## 2012-09-03 MED ORDER — MIDAZOLAM HCL 2 MG/2ML IJ SOLN
INTRAMUSCULAR | Status: AC
  Filled 2012-09-03: qty 6

## 2012-09-03 MED ORDER — POLYVINYL ALCOHOL 1.4 % OP SOLN
1.0000 [drp] | OPHTHALMIC | Status: DC | PRN
  Filled 2012-09-03: qty 15

## 2012-09-03 MED ORDER — LOSARTAN POTASSIUM 50 MG PO TABS
100.0000 mg | ORAL_TABLET | Freq: Every day | ORAL | Status: DC
  Administered 2012-09-03 – 2012-09-05 (×3): 100 mg via ORAL
  Filled 2012-09-03 (×4): qty 2

## 2012-09-03 NOTE — Progress Notes (Signed)
Pt arrived to unit from ED. Pt is A&O, skin intact, stable VS. Family is currently at bedside & pt is resting comfortably.

## 2012-09-03 NOTE — Progress Notes (Signed)
[facility] PROGRESS NOTE  Debra White AVW:[id number] DOB: [birth year] DOA: 09/02/2012 PCP: Nadean Corwin, MD  Assessment/Plan: Acute Diverticulitis (4th episode) with 1.4 cm abscess. Appreciate general surgery and IR consultations IR to place perc drain in diverticular abscess this afternoon. Patient on Cipro/Flagyl   Weight loss 30 lbs in past year secondary to pain/illness/diarrhea Nutrition consultation  Anxiety On Xanax TID  HTN Atenolol and losartan BP soft to normal  HLD  Normocytic anemia Appears chonic Secondary to poor nutrition and illness.  Code Status: full Family Communication: Sister at bedside Disposition Plan: home when appropriate.  Inpatient   Consultants:  General Surgery  Interventional Radiology   Procedures:    Antibiotics:  Cipro / Flagyl  HPI/Subjective: Patient reports frequent watery stools.  She is very anxious about the possibility of having surgery and is worried she would not recover from it.  She mentions a fall several months ago.  She sees a Retail buyer for back pain.  Objective: Filed Vitals:   09/02/12 1800 09/02/12 1921 09/02/12 1957 09/03/12 0532  BP: 129/56 97/48 140/68 110/58  Pulse: 63 62 65 62  Temp:  98.8 F (37.1 C) 97.9 F (36.6 C) 98.6 F (37 C)  TempSrc:  Oral Oral Oral  Resp: 29 19 18 18   Height:   5\' 4"  (1.626 m)   Weight:   46 kg (101 lb 6.6 oz)   SpO2: 100% 99% 96% 96%    Intake/Output Summary (Last 24 hours) at 09/03/12 1402 Last data filed at 09/03/12 0600  Gross per 24 hour  Intake 703.75 ml  Output      3 ml  Net 700.75 ml   Filed Weights   09/02/12 1957  Weight: 46 kg (101 lb 6.6 oz)    Exam:   General:  Thin, anxious appearing, female.  Lying comfortably in bed  Cardiovascular: rrr, no m/r/g  Respiratory: CTA, no w/c/r  Abdomen: thin, soft, nd, nt, +BS  Musculoskeletal: Able to move all 4 extremities - 5/5 strength in each.  Data Reviewed: Basic  Metabolic Panel:  Recent Labs Lab 09/02/12 1205 09/03/12 0622  NA 126* 132*  K 4.0 4.1  CL 89* 96  CO2 25 28  GLUCOSE 96 99  BUN 12 7  CREATININE 0.69 0.78  CALCIUM 9.0 8.8   Liver Function Tests:  Recent Labs Lab 09/02/12 1205 09/03/12 0622  AST 14 12  ALT 15 13  ALKPHOS 71 62  BILITOT 0.4 0.4  PROT 7.2 6.4  ALBUMIN 3.5 3.0*    Recent Labs Lab 09/02/12 1205  LIPASE 41   No results found for this basename: AMMONIA,  in the last 168 hours CBC:  Recent Labs Lab 09/02/12 1205 09/03/12 0622  WBC 10.3 6.3  NEUTROABS 7.6  --   HGB 10.9* 10.5*  HCT 30.8* 30.1*  MCV 90.3 93.2  PLT 259 255   CBG: Recent Labs Lab 09/03/12 0133 09/03/12 0753  GLUCAP 103* 90      Studies: Ct Abdomen Pelvis W Contrast  09/02/2012  *RADIOLOGY REPORT*  Clinical Data: Lower abdominal pain.  Diverticulitis.  CT ABDOMEN AND PELVIS WITH CONTRAST  Technique:  Multidetector CT imaging of the abdomen and pelvis was performed following the standard protocol during bolus administration of intravenous contrast.  Contrast: 75mL OMNIPAQUE IOHEXOL 300 MG/ML  SOLN  Comparison: 12/21/2011  Findings: Increased bibasilar atelectasis.  Liver, gallbladder, spleen, kidneys, pancreas are stable.  Wall thickening of the sigmoid colon and surrounding fatty stranding has increased.  There is a 3.1 x 1.7 cm air and fluid- filled abscess just inferior to the sigmoid colon, deep in the left hemi pelvis, to the left of the bladder.  See image 69.  A few air bubbles anterior to the main abscess may represent a few locules of extraluminal bowel gas in an adjacent tiny abscess.  There is otherwise no evidence of extraluminal bowel gas.  There is no second abscess.  Trace free fluid in the right posterior hemi pelvis.  Stable bladder.  Uterus absent.  Stable lumbar spine with postoperative changes and fusion.  Stable atherosclerotic changes involving the aorta and visceral vasculature.  Stable retroaortic left renal  vein.  IMPRESSION: Interval development of acute diverticulitis and a small 3.1 cm abscess. Given the location, drainage of this abscess would be extremely difficult percutaneously.   Original Report Authenticated By: Jolaine Click, M.D.     Scheduled Meds: . atenolol  100 mg Oral Daily  . cholecalciferol  2,000 Units Oral Daily  . ciprofloxacin  400 mg Intravenous Q12H  . fentaNYL      . losartan  100 mg Oral QHS  . metronidazole  500 mg Intravenous Q8H  . midazolam      . sodium chloride  3 mL Intravenous Q12H   Continuous Infusions: . dextrose 5 % and 0.9% NaCl 75 mL/hr at 09/03/12 1610    Active Problems:   * No active hospital problems. [place]ley Canal  [facility] Pager [phone]. If 7PM-7AM, please contact night-coverage at [email or web address], password [facility] 09/03/2012, 2:02 PM  LOS: 1 day   Attending Patient seen and examined, agree with the above assessment and plan. For Perc drainage of the diverticular abscess. Continue with Cipro and Flaygl  S Ghimire

## 2012-09-03 NOTE — Care Management Note (Unsigned)
    Page 1 of 1   09/03/2012     3:51:50 PM   CARE MANAGEMENT NOTE 09/03/2012  Patient:  Debra White, Debra White   Account Number:  [id number]  Date Initiated:  09/03/2012  Documentation initiated by:  Letha Cape  Subjective/Objective Assessment:   dx acute diverticulitis  admit- lives with spouse. pta indep.     Action/Plan:   Anticipated DC Date:  09/06/2012   Anticipated DC Plan:  HOME W HOME HEALTH SERVICES      DC Planning Services  CM consult      Choice offered to / List presented to:             Status of service:  In process, will continue to follow Medicare Important Message given?   (If response is "NO", the following Medicare IM given date fields will be blank) Date Medicare IM given:   Date Additional Medicare IM given:    Discharge Disposition:    Per UR Regulation:  Reviewed for med. necessity/level of care/duration of stay  If discussed at Long Length of Stay Meetings, dates discussed:    Comments:  09/02/12 15:51 Letha Cape RN, BSN [phone] patient lives with spouse, pta indep.  NCM will continue to follow for dc needs.

## 2012-09-03 NOTE — Progress Notes (Signed)
Patient ID: Debra White, female   DOB: 11/24/1933, 79 y.o.   MRN: [id number] Request received for CT guided pelvic /diverticular abscess aspiration/drainage on pt. Imaging studies were reviewed by Dr. Grace Isaac. Additional PMH as below. Exam: pt awake/alert; chest- sl dim BS bases; heart- RRR; abd- soft,+BS, mildly tender lower quadrants;ext- FROM, no edema.    Filed Vitals:   09/02/12 1800 09/02/12 1921 09/02/12 1957 09/03/12 0532  BP: 129/56 97/48 140/68 110/58  Pulse: 63 62 65 62  Temp:  98.8 F (37.1 C) 97.9 F (36.6 C) 98.6 F (37 C)  TempSrc:  Oral Oral Oral  Resp: 29 19 18 18   Height:   5\' 4"  (1.626 m)   Weight:   101 lb 6.6 oz (46 kg)   SpO2: 100% 99% 96% 96%   Past Medical History  Diagnosis Date  . Hypertension   . Hyperlipemia   . Fibromyalgia   . Anxiety   . Dysrhythmia     RX  . Blood transfusion   . Chronic kidney disease     STONES  . GERD (gastroesophageal reflux disease)   . Headache   . Arthritis   . Cataract   . Diverticulosis of colon (without mention of hemorrhage) 2007    Colonoscopy  . Candida esophagitis 2013    EGD  . Family history of colon cancer     sister  . Emphysema of lung    Past Surgical History  Procedure Laterality Date  . Abdominal hysterectomy    . Hand surgery      BIL   . Shoulder arthroscopy w/ rotator cuff repair      LFT  . Cervical disc surgery    . Back surgery      X2  . Orif tripod fracture  07/13/2011    Procedure: OPEN REDUCTION INTERNAL FIXATION (ORIF) TRIPOD FRACTURE;  Surgeon: [name], MD;  Location: [facility] OR;  Service: ENT;  Laterality: Right;  ORIF RIGHT ZYGOMA, ORBITAL FLOOR EXPLORATION WITH FROST STITCH (TEMPORARY TARSORRHAPHY)  . Colonoscopy    . Facial fracture surgery    . Back surgery     Mr Lumbar Spine Wo Contrast  08/15/2012  *RADIOLOGY REPORT*  Clinical Data: Spinal stenosis.  Weakness.  Back pain.  Recent fall.  MRI LUMBAR SPINE WITHOUT CONTRAST  Technique:  Multiplanar and multiecho pulse  sequences of the lumbar spine were obtained without intravenous contrast.  Comparison: 12/30/2011  Findings: There is no evidence of acute fracture in the region from lower T10-S3.  There is an old minor superior endplate deformity at L2 which is completely healed.  There are minimal non compressive disc bulges at T12-L1, L1-2 and L2-3.  There is posterior fusion from L3 to the sacrum.  There is anterior fusion at L4-5.  There is been previous posterior decompression in the lower lumbar region and in the spinal canal is widely patent.  IMPRESSION: Previous decompression and fusion from L3 to the sacrum.  Wide patency of the canal and foramina in that region.  Minimal, non compressive disc bulges at T12-L1, L1-2 and L2-3.  No acute fracture.  Old minimal superior endplate deformity at L2.   Original Report Authenticated By: Paulina Fusi, M.D.    Ct Abdomen Pelvis W Contrast  09/02/2012  *RADIOLOGY REPORT*  Clinical Data: Lower abdominal pain.  Diverticulitis.  CT ABDOMEN AND PELVIS WITH CONTRAST  Technique:  Multidetector CT imaging of the abdomen and pelvis was performed following the standard protocol during bolus administration of  intravenous contrast.  Contrast: 75mL OMNIPAQUE IOHEXOL 300 MG/ML  SOLN  Comparison: 12/21/2011  Findings: Increased bibasilar atelectasis.  Liver, gallbladder, spleen, kidneys, pancreas are stable.  Wall thickening of the sigmoid colon and surrounding fatty stranding has increased.  There is a 3.1 x 1.7 cm air and fluid- filled abscess just inferior to the sigmoid colon, deep in the left hemi pelvis, to the left of the bladder.  See image 69.  A few air bubbles anterior to the main abscess may represent a few locules of extraluminal bowel gas in an adjacent tiny abscess.  There is otherwise no evidence of extraluminal bowel gas.  There is no second abscess.  Trace free fluid in the right posterior hemi pelvis.  Stable bladder.  Uterus absent.  Stable lumbar spine with postoperative  changes and fusion.  Stable atherosclerotic changes involving the aorta and visceral vasculature.  Stable retroaortic left renal vein.  IMPRESSION: Interval development of acute diverticulitis and a small 3.1 cm abscess. Given the location, drainage of this abscess would be extremely difficult percutaneously.   Original Report Authenticated By: Jolaine Click, M.D.   Results for orders placed during the hospital encounter of 09/02/12  CBC WITH DIFFERENTIAL      Result Value Range   WBC 10.3  4.0 - 10.5 K/uL   RBC 3.41 (*) 3.87 - 5.11 MIL/uL   Hemoglobin 10.9 (*) 12.0 - 15.0 g/dL   HCT 16.1 (*) 09.6 - 04.5 %   MCV 90.3  78.0 - 100.0 fL   MCH 32.0  26.0 - 34.0 pg   MCHC 35.4  30.0 - 36.0 g/dL   RDW 40.9  81.1 - 91.4 %   Platelets 259  150 - 400 K/uL   Neutrophils Relative 73  43 - 77 %   Neutro Abs 7.6  1.7 - 7.7 K/uL   Lymphocytes Relative 12  12 - 46 %   Lymphs Abs 1.3  0.7 - 4.0 K/uL   Monocytes Relative 14 (*) 3 - 12 %   Monocytes Absolute 1.4 (*) 0.1 - 1.0 K/uL   Eosinophils Relative 1  0 - 5 %   Eosinophils Absolute 0.1  0.0 - 0.7 K/uL   Basophils Relative 0  0 - 1 %   Basophils Absolute 0.0  0.0 - 0.1 K/uL  COMPREHENSIVE METABOLIC PANEL      Result Value Range   Sodium 126 (*) 135 - 145 mEq/L   Potassium 4.0  3.5 - 5.1 mEq/L   Chloride 89 (*) 96 - 112 mEq/L   CO2 25  19 - 32 mEq/L   Glucose, Bld 96  70 - 99 mg/dL   BUN 12  6 - 23 mg/dL   Creatinine, Ser 7.82  0.50 - 1.10 mg/dL   Calcium 9.0  8.4 - 95.6 mg/dL   Total Protein 7.2  6.0 - 8.3 g/dL   Albumin 3.5  3.5 - 5.2 g/dL   AST 14  0 - 37 U/L   ALT 15  0 - 35 U/L   Alkaline Phosphatase 71  39 - 117 U/L   Total Bilirubin 0.4  0.3 - 1.2 mg/dL   GFR calc non Af Amer 82 (*) >90 mL/min   GFR calc Af Amer >90  >90 mL/min  LIPASE, BLOOD      Result Value Range   Lipase 41  11 - 59 U/L  URINALYSIS, ROUTINE W REFLEX MICROSCOPIC      Result Value Range   Color, Urine YELLOW  YELLOW   APPearance CLEAR  CLEAR   Specific  Gravity, Urine 1.011  1.005 - 1.030   pH 5.5  5.0 - 8.0   Glucose, UA NEGATIVE  NEGATIVE mg/dL   Hgb urine dipstick NEGATIVE  NEGATIVE   Bilirubin Urine NEGATIVE  NEGATIVE   Ketones, ur NEGATIVE  NEGATIVE mg/dL   Protein, ur NEGATIVE  NEGATIVE mg/dL   Urobilinogen, UA 0.2  0.0 - 1.0 mg/dL   Nitrite NEGATIVE  NEGATIVE   Leukocytes, UA TRACE (*) NEGATIVE  URINE MICROSCOPIC-ADD ON      Result Value Range   Squamous Epithelial / LPF FEW (*) RARE   WBC, UA 0-2  <3 WBC/hpf   Bacteria, UA RARE  RARE  COMPREHENSIVE METABOLIC PANEL      Result Value Range   Sodium 132 (*) 135 - 145 mEq/L   Potassium 4.1  3.5 - 5.1 mEq/L   Chloride 96  96 - 112 mEq/L   CO2 28  19 - 32 mEq/L   Glucose, Bld 99  70 - 99 mg/dL   BUN 7  6 - 23 mg/dL   Creatinine, Ser 1.61  0.50 - 1.10 mg/dL   Calcium 8.8  8.4 - 09.6 mg/dL   Total Protein 6.4  6.0 - 8.3 g/dL   Albumin 3.0 (*) 3.5 - 5.2 g/dL   AST 12  0 - 37 U/L   ALT 13  0 - 35 U/L   Alkaline Phosphatase 62  39 - 117 U/L   Total Bilirubin 0.4  0.3 - 1.2 mg/dL   GFR calc non Af Amer 79 (*) >90 mL/min   GFR calc Af Amer >90  >90 mL/min  CBC      Result Value Range   WBC 6.3  4.0 - 10.5 K/uL   RBC 3.23 (*) 3.87 - 5.11 MIL/uL   Hemoglobin 10.5 (*) 12.0 - 15.0 g/dL   HCT 04.5 (*) 40.9 - 81.1 %   MCV 93.2  78.0 - 100.0 fL   MCH 32.5  26.0 - 34.0 pg   MCHC 34.9  30.0 - 36.0 g/dL   RDW 91.4  78.2 - 95.6 %   Platelets 255  150 - 400 K/uL  GLUCOSE, CAPILLARY      Result Value Range   Glucose-Capillary 103 (*) 70 - 99 mg/dL   Comment 1 Notify RN     Comment 2 Documented in Chart    GLUCOSE, CAPILLARY      Result Value Range   Glucose-Capillary 90  70 - 99 mg/dL   A/P: Pt with hx of abdominal pain and acute diverticulitis with assoc LLQ abscess. Plan is for CT guided aspiration/possible drainage of abscess today. Details/risks of procedure d/w pt/husband with their understanding and consent.

## 2012-09-03 NOTE — Clinical Documentation Improvement (Signed)
Abnormal Labs Clarification  THIS DOCUMENT IS NOT A PERMANENT PART OF THE MEDICAL RECORD  TO RESPOND TO THE THIS QUERY, FOLLOW THE INSTRUCTIONS BELOW:  1. If needed, update documentation for the patient's encounter via the notes activity.  2. Access this query again and click edit on the [employer].  3. After updating, or not, click F2 to complete all highlighted (required) fields concerning your review. Select "additional documentation in the medical record" OR "no additional documentation provided".  4. Click Sign note button.  5. The deficiency will fall out of your InBasket *Please let us know if you are not able to complete this workflow by phone or e-mail (listed below).  Please update your documentation within the medical record to reflect your response to this query.                                                                                   09/03/12  Dear Dr. Jerral Ralph Marton Redwood  In a better effort to capture your patient's severity of illness, reflect appropriate length of stay and utilization of resources, a review of the medical record has revealed the following indicators.    Based on your clinical judgment, please clarify and document in a progress note and/or discharge summary the clinical condition associated with the following supporting information:  In responding to this query please exercise your independent judgment.  The fact that a query is asked, does not imply that any particular answer is desired or expected.  Abnormal findings (laboratory, x-ray, pathologic, and other diagnostic results) are not coded and reported unless the physician indicates their clinical significance.   The medical record reflects the following clinical findings, please clarify the diagnostic and/or clinical significance:      Possible Clinical Conditions?                                 Supporting Information:  HYPONATREMIA                                                     Lab Test:     Na = 126 ON 3-11  OTHER CONDITION                                             Risk Factors: "WEIGHT LOSS & ACUTE DIVERTICULAR ABSCESS"                                     CANNOT CLINICALLY DETERMINE          Treatment: 0.9 % Sodium IV @ 150 ml/hr                         Reviewed:  no additional documentation provided  Thank You,  Joanette Gula Delk RN, BSN. CCDS Clinical Documentation Specialist: [phone] Pager Health Information Management [place]

## 2012-09-03 NOTE — Progress Notes (Signed)
Will attempt anterior approach drain, though given position of abscess, may require a L transgluteal approach drain.

## 2012-09-03 NOTE — Progress Notes (Signed)
INITIAL NUTRITION ASSESSMENT  DOCUMENTATION CODES Per approved criteria  -Severe malnutrition in the context of chronic illness -Underweight   INTERVENTION: 1. Add Ensure Complete po BID, each supplement provides 350 kcal and 13 grams of protein. Once diet is advanced   2. Recommend continue Ensure at home.   3. Encouraged high protein, high calorie meals and snacks  4. RD will continue to follow   NUTRITION DIAGNOSIS: Inadequate oral intake related to poor appetite, limited food tolerance as evidenced by weight loss.   Goal: Po intake to meet >/=90% estimated nutrition needs  Monitor:  PO intake, weight trends, I/O's  Reason for Assessment: Malnutrition Screening Tool  79 y.o. female  Admitting Dx: Diverticulitis- abscess   ASSESSMENT: Pt admitted with abdominal pain.  Pt has a hx of fall requiring facial surgery and abx treatment. Pt states since being on abx she has been able to tolerate a very limited amount/types of foods. Reported 30 lb weight loss in 1 year. Weight hs shows 22% body weight loss in 14 months, 15% in 1 year. Rate of weight loss has slowed over the past few months. Current weight is stable from January. Pt recently started Ensure supplements.   Nutrition focused physical exam: Temples-moderate wasting Clavicles- Severe wasting Biceps- severe wasting Acromion process- moderate wasting   Pt meets criteria for severe malnutrition in the setting of chronic illness 2/2 severe muscle wasting, meeting ,75% estimated needs for > 1 month.   Height: Ht Readings from Last 1 Encounters:  09/02/12 5\' 4"  (1.626 m)    Weight: Wt Readings from Last 1 Encounters:  09/02/12 101 lb 6.6 oz (46 kg)    Ideal Body Weight: 120 lbs   % Ideal Body Weight: 84%  Wt Readings from Last 10 Encounters:  09/02/12 101 lb 6.6 oz (46 kg)  07/24/12 101 lb 9.6 oz (46.085 kg)  07/16/12 101 lb 9.6 oz (46.085 kg)  03/17/12 106 lb (48.081 kg)  03/11/12 106 lb (48.081 kg)   01/15/12 108 lb 3.2 oz (49.079 kg)  11/19/11 117 lb (53.071 kg)  09/24/11 120 lb (54.432 kg)  09/20/11 120 lb (54.432 kg)  07/13/11 130 lb 11.2 oz (59.285 kg)    Usual Body Weight: 130 lbs   % Usual Body Weight: 76%  BMI:  Body mass index is 17.4 kg/(m^2). Underweight   Estimated Nutritional Needs: Kcal: 1400-1600 Protein: 50-60 gm  Fluid: 1.4-1.6 L   Skin: intact   Diet Order: NPO  EDUCATION NEEDS: -No education needs identified at this time   Intake/Output Summary (Last 24 hours) at 09/03/12 1030 Last data filed at 09/03/12 0600  Gross per 24 hour  Intake 703.75 ml  Output      3 ml  Net 700.75 ml    Last BM: PTA    Labs:   Recent Labs Lab 09/02/12 1205 09/03/12 0622  NA 126* 132*  K 4.0 4.1  CL 89* 96  CO2 25 28  BUN 12 7  CREATININE 0.69 0.78  CALCIUM 9.0 8.8  GLUCOSE 96 99    CBG (last 3)   Recent Labs  09/03/12 0133 09/03/12 0753  GLUCAP 103* 90    Scheduled Meds: . acetaminophen      . atenolol  100 mg Oral Daily  . cholecalciferol  2,000 Units Oral Daily  . ciprofloxacin  400 mg Intravenous Q12H  . losartan  100 mg Oral QHS  . metronidazole  500 mg Intravenous Q8H  . sodium chloride  3 mL Intravenous  Q12H    Continuous Infusions: . dextrose 5 % and 0.9% NaCl 75 mL/hr at 09/03/12 1610    Past Medical History  Diagnosis Date  . Hypertension   . Hyperlipemia   . Fibromyalgia   . Anxiety   . Dysrhythmia     RX  . Blood transfusion   . Chronic kidney disease     STONES  . GERD (gastroesophageal reflux disease)   . Headache   . Arthritis   . Cataract   . Diverticulosis of colon (without mention of hemorrhage) 2007    Colonoscopy  . Candida esophagitis 2013    EGD  . Family history of colon cancer     sister  . Emphysema of lung     Past Surgical History  Procedure Laterality Date  . Abdominal hysterectomy    . Hand surgery      BIL   . Shoulder arthroscopy w/ rotator cuff repair      LFT  . Cervical disc  surgery    . Back surgery      X2  . Orif tripod fracture  07/13/2011    Procedure: OPEN REDUCTION INTERNAL FIXATION (ORIF) TRIPOD FRACTURE;  Surgeon: [name], MD;  Location: [facility] OR;  Service: ENT;  Laterality: Right;  ORIF RIGHT ZYGOMA, ORBITAL FLOOR EXPLORATION WITH FROST STITCH (TEMPORARY TARSORRHAPHY)  . Colonoscopy    . Facial fracture surgery    . Back surgery      Clarene Duke RD, LDN Pager [phone] After Hours pager [phone]

## 2012-09-03 NOTE — Clinical Documentation Improvement (Signed)
MALNUTRITION DOCUMENTATION CLARIFICATION  THIS DOCUMENT IS NOT A PERMANENT PART OF THE MEDICAL RECORD  TO RESPOND TO THE THIS QUERY, FOLLOW THE INSTRUCTIONS BELOW:  1. If needed, update documentation for the patient's encounter via the notes activity.  2. Access this query again and click edit on the In [employer].  3. After updating, or not, click F2 to complete all highlighted (required) fields concerning your review. Select "additional documentation in the medical record" OR "no additional documentation provided".  4. Click Sign note button.  5. The deficiency will fall out of your In Basket *Please let us know if you are not able to complete this workflow by phone or e-mail (listed below).  Please update your documentation within the medical record to reflect your response to this query.                                                                                        09/03/12   Dear Dr.GHIMIRE / Associates,  In a better effort to capture your patient's severity of illness, reflect appropriate length of stay and utilization of resources, a review of the patient medical record has revealed the following indicators.    Based on your clinical judgment, please clarify and document in a progress note and/or discharge summary the clinical condition associated with the following supporting information:  In responding to this query please exercise your independent judgment.  The fact that a query is asked, does not imply that any particular answer is desired or expected.  Possible  Clinical Conditions?  Severe Malnutrition    Severe Protein Calorie Malnutrition  Other Condition  Cannot clinically determine    Supporting Information:  Risk Factors:(As per notes) " Acute diverticular abscess"   Signs & Symptoms:(As per notes) " weight Loss" Ht: 101 lbs     Wt: 5'4"  BMI: 17  3-12  INITIAL NUTRITION ASSESSMENT  By RD  -Severe malnutrition in the context of chronic  illness  -Underweight    Treatment  INTERVENTION: 1. Add Ensure Complete po BID, each supplement provides 350 kcal and 13 grams of protein. Once diet is advanced   2. Recommend continue Ensure at home.   3. Encouraged high protein, high calorie meals and snacks  4. RD will continue to follow     You may use possible, probable, or suspect with inpatient documentation. possible, probable, suspected diagnoses MUST be documented at the time of discharge  Reviewed: additional documentation in the medical record  Thank You,  Joanette Gula Delk RN, BSN, CCDS Clinical Documentation Specialist: [phone] Pager Health Information Management [place]

## 2012-09-03 NOTE — Progress Notes (Signed)
Subjective: Pt with some minor abd pain in B LQ No n/v Feels pain is better today  Objective: Vital signs in last 24 hours: Temp:  [97.9 F (36.6 C)-98.8 F (37.1 C)] 98.6 F (37 C) (03/12 0532) Pulse Rate:  [56-65] 62 (03/12 0532) Resp:  [16-29] 18 (03/12 0532) BP: (97-143)/(48-68) 110/58 mmHg (03/12 0532) SpO2:  [96 %-100 %] 96 % (03/12 0532) Weight:  [101 lb 6.6 oz (46 kg)] 101 lb 6.6 oz (46 kg) (03/11 1957) Last BM Date: 09/02/12  Intake/Output from previous day: 03/11 0701 - 03/12 0700 In: 703.8 [I.V.:703.8] Out: 3 [Urine:3] Intake/Output this shift:    General appearance: alert and cooperative GI: s/nd/ min ttp BLQ/ hypoactive BS  Lab Results:   Recent Labs  09/02/12 1205 09/03/12 0622  WBC 10.3 6.3  HGB 10.9* 10.5*  HCT 30.8* 30.1*  PLT 259 255   BMET  Recent Labs  09/02/12 1205 09/03/12 0622  NA 126* 132*  K 4.0 4.1  CL 89* 96  CO2 25 28  GLUCOSE 96 99  BUN 12 7  CREATININE 0.69 0.78  CALCIUM 9.0 8.8   PT/INR No results found for this basename: LABPROT, INR,  in the last 72 hours ABG No results found for this basename: PHART, PCO2, PO2, HCO3,  in the last 72 hours  Studies/Results: Ct Abdomen Pelvis W Contrast  09/02/2012  *RADIOLOGY REPORT*  Clinical Data: Lower abdominal pain.  Diverticulitis.  CT ABDOMEN AND PELVIS WITH CONTRAST  Technique:  Multidetector CT imaging of the abdomen and pelvis was performed following the standard protocol during bolus administration of intravenous contrast.  Contrast: 75mL OMNIPAQUE IOHEXOL 300 MG/ML  SOLN  Comparison: 12/21/2011  Findings: Increased bibasilar atelectasis.  Liver, gallbladder, spleen, kidneys, pancreas are stable.  Wall thickening of the sigmoid colon and surrounding fatty stranding has increased.  There is a 3.1 x 1.7 cm air and fluid- filled abscess just inferior to the sigmoid colon, deep in the left hemi pelvis, to the left of the bladder.  See image 69.  A few air bubbles anterior to the  main abscess may represent a few locules of extraluminal bowel gas in an adjacent tiny abscess.  There is otherwise no evidence of extraluminal bowel gas.  There is no second abscess.  Trace free fluid in the right posterior hemi pelvis.  Stable bladder.  Uterus absent.  Stable lumbar spine with postoperative changes and fusion.  Stable atherosclerotic changes involving the aorta and visceral vasculature.  Stable retroaortic left renal vein.  IMPRESSION: Interval development of acute diverticulitis and a small 3.1 cm abscess. Given the location, drainage of this abscess would be extremely difficult percutaneously.   Original Report Authenticated By: Jolaine Click, M.D.     Anti-infectives: Anti-infectives   Start     Dose/Rate Route Frequency Ordered Stop   09/03/12 0830  ciprofloxacin (CIPRO) IVPB 400 mg     400 mg 200 mL/hr over 60 Minutes Intravenous Every 12 hours 09/03/12 0824     09/03/12 0830  metroNIDAZOLE (FLAGYL) IVPB 500 mg     500 mg 100 mL/hr over 60 Minutes Intravenous Every 8 hours 09/03/12 0824        Assessment/Plan: s/p * No surgery found * Con't NPO IV abx No surgery planned at this time.  We will proceed with non-operative tx for now.  I discussed with the pt should her abd pain and/or abscess increase of worsen than we would talk about proceeding to the OR   LOS:  1 day    Marigene Ehlers., Jed Limerick 09/03/2012

## 2012-09-03 NOTE — ED Notes (Signed)
Procedure not done

## 2012-09-04 DIAGNOSIS — R634 Abnormal weight loss: Secondary | ICD-10-CM

## 2012-09-04 DIAGNOSIS — Z8719 Personal history of other diseases of the digestive system: Secondary | ICD-10-CM

## 2012-09-04 DIAGNOSIS — T50904A Poisoning by unspecified drugs, medicaments and biological substances, undetermined, initial encounter: Secondary | ICD-10-CM

## 2012-09-04 LAB — CBC
HCT: 29.8 % — ABNORMAL LOW (ref 36.0–46.0)
Hemoglobin: 10.5 g/dL — ABNORMAL LOW (ref 12.0–15.0)
MCV: 92.5 fL (ref 78.0–100.0)
RBC: 3.22 MIL/uL — ABNORMAL LOW (ref 3.87–5.11)
RDW: 12.1 % (ref 11.5–15.5)
WBC: 6.4 10*3/uL (ref 4.0–10.5)

## 2012-09-04 LAB — GLUCOSE, CAPILLARY
Glucose-Capillary: 101 mg/dL — ABNORMAL HIGH (ref 70–99)
Glucose-Capillary: 117 mg/dL — ABNORMAL HIGH (ref 70–99)

## 2012-09-04 LAB — BASIC METABOLIC PANEL
CO2: 28 mEq/L (ref 19–32)
Chloride: 99 mEq/L (ref 96–112)
Creatinine, Ser: 0.73 mg/dL (ref 0.50–1.10)
GFR calc Af Amer: >90 mL/min (ref >90)
Potassium: 3.7 mEq/L (ref 3.5–5.1)
Sodium: 135 mEq/L (ref 135–145)

## 2012-09-04 LAB — CLOSTRIDIUM DIFFICILE BY PCR: Toxigenic C. Difficile by PCR: NEGATIVE

## 2012-09-04 MED ORDER — ENSURE COMPLETE PO LIQD
237.0000 mL | Freq: Three times a day (TID) | ORAL | Status: DC
  Administered 2012-09-04 – 2012-09-08 (×3): 237 mL via ORAL

## 2012-09-04 MED ORDER — ENOXAPARIN SODIUM 40 MG/0.4ML ~~LOC~~ SOLN
40.0000 mg | Freq: Every day | SUBCUTANEOUS | Status: DC
  Administered 2012-09-04 – 2012-09-07 (×4): 40 mg via SUBCUTANEOUS
  Filled 2012-09-04 (×4): qty 0.4

## 2012-09-04 MED ORDER — SACCHAROMYCES BOULARDII 250 MG PO CAPS
250.0000 mg | ORAL_CAPSULE | Freq: Two times a day (BID) | ORAL | Status: DC
  Administered 2012-09-04 – 2012-09-08 (×8): 250 mg via ORAL
  Filled 2012-09-04 (×10): qty 1

## 2012-09-04 MED ORDER — PIPERACILLIN-TAZOBACTAM 3.375 G IVPB
3.3750 g | Freq: Three times a day (TID) | INTRAVENOUS | Status: DC
  Administered 2012-09-04 – 2012-09-08 (×13): 3.375 g via INTRAVENOUS
  Filled 2012-09-04 (×17): qty 50

## 2012-09-04 NOTE — Progress Notes (Signed)
Patient ID: [name], female   DOB: 1933/11/11, 79 y.o.   MRN: [id number]    Subjective: Pt feels weak, but does have less pain today.  Ate some broth this morning. C/o diarrhea secondary to tics and Cipro  Objective: Vital signs in last 24 hours: Temp:  [98.1 F (36.7 C)-98.3 F (36.8 C)] 98.3 F (36.8 C) (03/13 0540) Pulse Rate:  [63-71] 63 (03/13 0540) Resp:  [16-18] 18 (03/13 0540) BP: (114-158)/(53-73) 114/65 mmHg (03/13 0540) SpO2:  [94 %-100 %] 94 % (03/13 0540) Last BM Date: 09/03/12  Intake/Output from previous day: 03/12 0701 - 03/13 0700 In: 2490 [P.O.:690; I.V.:1800] Out: 1100 [Urine:1100] Intake/Output this shift:    PE: Abd: soft, less tender, few BS, ND  Lab Results:   Recent Labs  09/03/12 0622 09/04/12 0510  WBC 6.3 6.4  HGB 10.5* 10.5*  HCT 30.1* 29.8*  PLT 255 265   BMET  Recent Labs  09/03/12 0622 09/04/12 0510  NA 132* 135  K 4.1 3.7  CL 96 99  CO2 28 28  GLUCOSE 99 109*  BUN 7 5*  CREATININE 0.78 0.73  CALCIUM 8.8 8.8   PT/INR  Recent Labs  09/03/12 0940  LABPROT 14.3  INR 1.13   CMP     Component Value Date/Time   NA 135 09/04/2012 0510   K 3.7 09/04/2012 0510   CL 99 09/04/2012 0510   CO2 28 09/04/2012 0510   GLUCOSE 109* 09/04/2012 0510   BUN 5* 09/04/2012 0510   CREATININE 0.73 09/04/2012 0510   CALCIUM 8.8 09/04/2012 0510   PROT 6.4 09/03/2012 0622   ALBUMIN 3.0* 09/03/2012 0622   AST 12 09/03/2012 0622   ALT 13 09/03/2012 0622   ALKPHOS 62 09/03/2012 0622   BILITOT 0.4 09/03/2012 0622   GFRNONAA 81* 09/04/2012 0510   GFRAA >90 09/04/2012 0510   Lipase     Component Value Date/Time   LIPASE 41 09/02/2012 1205       Studies/Results: Ct Pelvis Wo Contrast  09/03/2012  *RADIOLOGY REPORT*  Clinical Data: Known diverticular abscess within the left lower abdominal quadrant - please attempt CT guided percutaneous drainage  CT PELVIS WITHOUT CONTRAST  Technique:  Multidetector CT imaging of the pelvis was performed  following the standard protocol without intravenous contrast.  Comparison: CT abdomen pelvis - 09/02/2012  Findings:  Limited noncontrast CT imaging of the lower pelvis demonstrates grossly unchanged appearance of note approximately 3.3 x 1.7 cm air and fluid containing diverticular abscess within the caudal aspect of the left hemi pelvis (image 12, series 2).  With the patient initially positioned supine an adequate percutaneous window was not able to be achieved even with placement of a Foley catheter to decompressthe urinary bladder.  As such, the patient was positioned prone on the CT gantry however repeat CT imaging failed to delineate and adequate percutaneous window for a drainage catheter placement.  As such, no drainage catheter was placed.  Note, previously ingested enteric contrast extends to the level of the rectum.  There is no discrete evidence of leakage of ingested enteric contrast into the left lower quadrant diverticular abscess.  IMPRESSION: Grossly unchanged approximately 3.3 cm air and fluid containing diverticular abscess within the left hemi abdomen not amendable to percutaneous drainage secondary to lack of adequate percutaneous window.  No drainage catheter placed.  Above findings discussed with Barnetta Chapel, surgical PA, at the time of procedure completion.   Original Report Authenticated By: Tacey Ruiz, MD  Ct Abdomen Pelvis W Contrast  09/02/2012  *RADIOLOGY REPORT*  Clinical Data: Lower abdominal pain.  Diverticulitis.  CT ABDOMEN AND PELVIS WITH CONTRAST  Technique:  Multidetector CT imaging of the abdomen and pelvis was performed following the standard protocol during bolus administration of intravenous contrast.  Contrast: 75mL OMNIPAQUE IOHEXOL 300 MG/ML  SOLN  Comparison: 12/21/2011  Findings: Increased bibasilar atelectasis.  Liver, gallbladder, spleen, kidneys, pancreas are stable.  Wall thickening of the sigmoid colon and surrounding fatty stranding has increased.  There  is a 3.1 x 1.7 cm air and fluid- filled abscess just inferior to the sigmoid colon, deep in the left hemi pelvis, to the left of the bladder.  See image 69.  A few air bubbles anterior to the main abscess may represent a few locules of extraluminal bowel gas in an adjacent tiny abscess.  There is otherwise no evidence of extraluminal bowel gas.  There is no second abscess.  Trace free fluid in the right posterior hemi pelvis.  Stable bladder.  Uterus absent.  Stable lumbar spine with postoperative changes and fusion.  Stable atherosclerotic changes involving the aorta and visceral vasculature.  Stable retroaortic left renal vein.  IMPRESSION: Interval development of acute diverticulitis and a small 3.1 cm abscess. Given the location, drainage of this abscess would be extremely difficult percutaneously.   Original Report Authenticated By: Jolaine Click, M.D.     Anti-infectives: Anti-infectives   Start     Dose/Rate Route Frequency Ordered Stop   09/03/12 1000  ciprofloxacin (CIPRO) IVPB 400 mg     400 mg 200 mL/hr over 60 Minutes Intravenous Every 12 hours 09/03/12 0824     09/03/12 1000  metroNIDAZOLE (FLAGYL) IVPB 500 mg     500 mg 100 mL/hr over 60 Minutes Intravenous Every 8 hours 09/03/12 0824         Assessment/Plan  1. Diverticulitis with microperf and small abscess, not drainable 2. Chronic diarrhea  Plan: 1. Clear liquids is ok for now, but I would go slow with her diet advancement given her abscess.  Cont abx therapy.  She would like to switch off of Cipro.  Zosyn would be appropriate, talked to primary about this. 2. IR unable to drain abscess, cont conservative management with hopes to avoid surgery during acute attack.   LOS: 2 days    Colina,KELLY E 09/04/2012, 9:23 AM Pager: [phone]

## 2012-09-04 NOTE — Progress Notes (Signed)
I have seen and examined the pt and agree with PA-Stadel's note.   Con't abx Clear OK for now. We will slowly adv her diet

## 2012-09-04 NOTE — Progress Notes (Signed)
PATIENT DETAILS Name: Debra White Age: 79 y.o. Sex: female Date of Birth: 1934-06-11 Admit Date: 09/02/2012 Admitting Physician Rhetta Mura, MD [id number] DAVID, MD  Subjective: No abdominal pain-but has no appetite. Numerous loose stools overnight  Assessment/Plan: Active Problems: Acute Diverticulitis (4th episode) with 1.4 cm abscess.  -Appreciate general surgery and IR consultations  -IR consulted on 3/12-but unable to place perc drain  -was on Cipro/Flagyl since admission-but on 3/13 claimed that Cipro always causes diarrhea-therefore will switch to Zosyn -belly is non tender-continue on clear liquid  Diarrhea -?from Cipro -check C Diff PCR - Will start Florastor  HTN Controlled with Atenolol and losartan  Anxiety  On Xanax TID  Severe malnutrition in the context of chronic illness/Underweight  -continue with supplements  GERD -PPI -stable  Disposition: Remain inpatient  DVT Prophylaxis: Prophylactic Lovenox   Code Status: Full code   Procedures:  None  CONSULTS:  general surgery  PHYSICAL EXAM: Vital signs in last 24 hours: Filed Vitals:   09/03/12 0532 09/03/12 1409 09/03/12 2146 09/04/12 0540  BP: 110/58 114/53 158/73 114/65  Pulse: 62 71 67 63  Temp: 98.6 F (37 C)  98.1 F (36.7 C) 98.3 F (36.8 C)  TempSrc: Oral  Oral Oral  Resp: 18 17 16 18   Height:      Weight:      SpO2: 96% 100% 98% 94%    Weight change:  Body mass index is 17.4 kg/(m^2).   Gen Exam: Awake and alert with clear speech.   Neck: Supple, No JVD.   Chest: B/L Clear.   CVS: S1 S2 Regular, no murmurs.  Abdomen: soft, BS +, non tender, non distended.  Extremities: no edema, lower extremities warm to touch. Neurologic: Non Focal.   Skin: No Rash.   Wounds: N/A.    Intake/Output from previous day:  Intake/Output Summary (Last 24 hours) at 09/04/12 1230 Last data filed at 09/04/12 0600  Gross per 24 hour  Intake   2490 ml  Output   1100 ml   Net   1390 ml     LAB RESULTS: CBC  Recent Labs Lab 09/02/12 1205 09/03/12 0622 09/04/12 0510  WBC 10.3 6.3 6.4  HGB 10.9* 10.5* 10.5*  HCT 30.8* 30.1* 29.8*  PLT 259 255 265  MCV 90.3 93.2 92.5  MCH 32.0 32.5 32.6  MCHC 35.4 34.9 35.2  RDW 12.3 12.2 12.1  LYMPHSABS 1.3  --   --   MONOABS 1.4*  --   --   EOSABS 0.1  --   --   BASOSABS 0.0  --   --     Chemistries   Recent Labs Lab 09/02/12 1205 09/03/12 0622 09/04/12 0510  NA 126* 132* 135  K 4.0 4.1 3.7  CL 89* 96 99  CO2 25 28 28   GLUCOSE 96 99 109*  BUN 12 7 5*  CREATININE 0.69 0.78 0.73  CALCIUM 9.0 8.8 8.8    CBG:  Recent Labs Lab 09/03/12 0133 09/03/12 0753 09/03/12 1840 09/04/12 0121 09/04/12 0751  GLUCAP 103* 90 171* 101* 117*    GFR Estimated Creatinine Clearance: 43.4 ml/min (by C-G formula based on Cr of 0.73).  Coagulation profile  Recent Labs Lab 09/03/12 0940  INR 1.13    Cardiac Enzymes No results found for this basename: CK, CKMB, TROPONINI, MYOGLOBIN,  in the last 168 hours  No components found with this basename: POCBNP,  No results found for this basename: DDIMER,  in the last 72 hours No  results found for this basename: HGBA1C,  in the last 72 hours No results found for this basename: CHOL, HDL, LDLCALC, TRIG, CHOLHDL, LDLDIRECT,  in the last 72 hours No results found for this basename: TSH, T4TOTAL, FREET3, T3FREE, THYROIDAB,  in the last 72 hours No results found for this basename: VITAMINB12, FOLATE, FERRITIN, TIBC, IRON, RETICCTPCT,  in the last 72 hours  Recent Labs  09/02/12 1205  LIPASE 41    Urine Studies No results found for this basename: UACOL, UAPR, USPG, UPH, UTP, UGL, UKET, UBIL, UHGB, UNIT, UROB, ULEU, UEPI, UWBC, URBC, UBAC, CAST, CRYS, UCOM, BILUA,  in the last 72 hours  MICROBIOLOGY: No results found for this or any previous visit (from the past 240 hour(s)).  RADIOLOGY STUDIES/RESULTS: Ct Pelvis Wo Contrast  09/03/2012  *RADIOLOGY  REPORT*  Clinical Data: Known diverticular abscess within the left lower abdominal quadrant - please attempt CT guided percutaneous drainage  CT PELVIS WITHOUT CONTRAST  Technique:  Multidetector CT imaging of the pelvis was performed following the standard protocol without intravenous contrast.  Comparison: CT abdomen pelvis - 09/02/2012  Findings:  Limited noncontrast CT imaging of the lower pelvis demonstrates grossly unchanged appearance of note approximately 3.3 x 1.7 cm air and fluid containing diverticular abscess within the caudal aspect of the left hemi pelvis (image 12, series 2).  With the patient initially positioned supine an adequate percutaneous window was not able to be achieved even with placement of a Foley catheter to decompressthe urinary bladder.  As such, the patient was positioned prone on the CT gantry however repeat CT imaging failed to delineate and adequate percutaneous window for a drainage catheter placement.  As such, no drainage catheter was placed.  Note, previously ingested enteric contrast extends to the level of the rectum.  There is no discrete evidence of leakage of ingested enteric contrast into the left lower quadrant diverticular abscess.  IMPRESSION: Grossly unchanged approximately 3.3 cm air and fluid containing diverticular abscess within the left hemi abdomen not amendable to percutaneous drainage secondary to lack of adequate percutaneous window.  No drainage catheter placed.  Above findings discussed with Barnetta Chapel, surgical PA, at the time of procedure completion.   Original Report Authenticated By: Tacey Ruiz, MD    Mr Lumbar Spine Wo Contrast  08/15/2012  *RADIOLOGY REPORT*  Clinical Data: Spinal stenosis.  Weakness.  Back pain.  Recent fall.  MRI LUMBAR SPINE WITHOUT CONTRAST  Technique:  Multiplanar and multiecho pulse sequences of the lumbar spine were obtained without intravenous contrast.  Comparison: 12/30/2011  Findings: There is no evidence of acute  fracture in the region from lower T10-S3.  There is an old minor superior endplate deformity at L2 which is completely healed.  There are minimal non compressive disc bulges at T12-L1, L1-2 and L2-3.  There is posterior fusion from L3 to the sacrum.  There is anterior fusion at L4-5.  There is been previous posterior decompression in the lower lumbar region and in the spinal canal is widely patent.  IMPRESSION: Previous decompression and fusion from L3 to the sacrum.  Wide patency of the canal and foramina in that region.  Minimal, non compressive disc bulges at T12-L1, L1-2 and L2-3.  No acute fracture.  Old minimal superior endplate deformity at L2.   Original Report Authenticated By: Paulina Fusi, M.D.    Ct Abdomen Pelvis W Contrast  09/02/2012  *RADIOLOGY REPORT*  Clinical Data: Lower abdominal pain.  Diverticulitis.  CT ABDOMEN AND PELVIS WITH  CONTRAST  Technique:  Multidetector CT imaging of the abdomen and pelvis was performed following the standard protocol during bolus administration of intravenous contrast.  Contrast: 75mL OMNIPAQUE IOHEXOL 300 MG/ML  SOLN  Comparison: 12/21/2011  Findings: Increased bibasilar atelectasis.  Liver, gallbladder, spleen, kidneys, pancreas are stable.  Wall thickening of the sigmoid colon and surrounding fatty stranding has increased.  There is a 3.1 x 1.7 cm air and fluid- filled abscess just inferior to the sigmoid colon, deep in the left hemi pelvis, to the left of the bladder.  See image 69.  A few air bubbles anterior to the main abscess may represent a few locules of extraluminal bowel gas in an adjacent tiny abscess.  There is otherwise no evidence of extraluminal bowel gas.  There is no second abscess.  Trace free fluid in the right posterior hemi pelvis.  Stable bladder.  Uterus absent.  Stable lumbar spine with postoperative changes and fusion.  Stable atherosclerotic changes involving the aorta and visceral vasculature.  Stable retroaortic left renal vein.   IMPRESSION: Interval development of acute diverticulitis and a small 3.1 cm abscess. Given the location, drainage of this abscess would be extremely difficult percutaneously.   Original Report Authenticated By: Jolaine Click, M.D.     MEDICATIONS: Scheduled Meds: . atenolol  100 mg Oral Daily  . cholecalciferol  2,000 Units Oral Daily  . feeding supplement  237 mL Oral TID WC  . losartan  100 mg Oral QHS  . pantoprazole  40 mg Oral Daily  . piperacillin-tazobactam (ZOSYN)  IV  3.375 g Intravenous Q8H  . saccharomyces boulardii  250 mg Oral BID  . sodium chloride  3 mL Intravenous Q12H   Continuous Infusions: . dextrose 5 % and 0.9% NaCl 75 mL/hr at 09/03/12 0537   PRN Meds:.acetaminophen, ALPRAZolam, artificial tears, morphine injection, polyvinyl alcohol, promethazine  Antibiotics: Anti-infectives   Start     Dose/Rate Route Frequency Ordered Stop   09/04/12 1100  piperacillin-tazobactam (ZOSYN) IVPB 3.375 g     3.375 g 12.5 mL/hr over 240 Minutes Intravenous 3 times per day 09/04/12 0937     09/03/12 1000  ciprofloxacin (CIPRO) IVPB 400 mg  Status:  Discontinued     400 mg 200 mL/hr over 60 Minutes Intravenous Every 12 hours 09/03/12 0824 09/04/12 0937   09/03/12 1000  metroNIDAZOLE (FLAGYL) IVPB 500 mg  Status:  Discontinued     500 mg 100 mL/hr over 60 Minutes Intravenous Every 8 hours 09/03/12 0824 09/04/12 0937       Jeoffrey Massed, MD  [facility] Pager:[phone]  If 7PM-7AM, please contact night-coverage [email or web address] Password [email or web address] 09/04/2012, 12:30 PM   LOS: 2 days

## 2012-09-05 LAB — BASIC METABOLIC PANEL
BUN: 5 mg/dL — ABNORMAL LOW (ref 6–23)
CO2: 31 mEq/L (ref 19–32)
Chloride: 99 mEq/L (ref 96–112)
GFR calc Af Amer: >90 mL/min (ref >90)
Potassium: 4 mEq/L (ref 3.5–5.1)

## 2012-09-05 LAB — CBC
HCT: 30.8 % — ABNORMAL LOW (ref 36.0–46.0)
MCV: 91.7 fL (ref 78.0–100.0)
RBC: 3.36 MIL/uL — ABNORMAL LOW (ref 3.87–5.11)
RDW: 12.4 % (ref 11.5–15.5)
WBC: 6.3 10*3/uL (ref 4.0–10.5)

## 2012-09-05 LAB — GLUCOSE, CAPILLARY: Glucose-Capillary: 99 mg/dL (ref 70–99)

## 2012-09-05 MED ORDER — ALUM & MAG HYDROXIDE-SIMETH 200-200-20 MG/5ML PO SUSP
30.0000 mL | Freq: Four times a day (QID) | ORAL | Status: DC | PRN
  Administered 2012-09-05 – 2012-09-06 (×2): 30 mL via ORAL
  Filled 2012-09-05 (×3): qty 30

## 2012-09-05 NOTE — Progress Notes (Signed)
Patient evaluated for long-term disease management services with [facility] Care Management Program as a benefit of her [employer]. At bedside to speak with patient about [facility] Care Management services. She reports she is not interested right now. States she has indigestion and does not feel like talking. Left brochure and contact information at bedside to inform patient to call if she becomes interested in the future.  Raiford Noble, MSN-Ed, RN,BSN, [facility], [phone]

## 2012-09-05 NOTE — Plan of Care (Addendum)
Problem: Food- and Nutrition-Related Knowledge Deficit (NB-1.1) Goal: Nutrition education Formal process to instruct or train a patient/client in a skill or to impart knowledge to help patients/clients voluntarily manage or modify food choices and eating behavior to maintain or improve health.  Outcome: Not Met (add Reason) RD consulted for diet education regarding diverticulitis.  Dietetic Intern provided pt with handouts and attempted to complete education.  Pt had just eaten lunch and was is distress with severe indigestion. Pt did not express interest in full diet education for diverticulitis. Intern left pt handouts to review. Please re-consult if pt has any questions.    Belenda Cruise  Dietetic Intern Pager: [phone]     I agree with student dietitian note.  Joaquin Courts, RD, LDN, CNSC Pager# [phone] After Hours Pager# [phone]

## 2012-09-05 NOTE — Progress Notes (Signed)
PATIENT DETAILS Name: Debra White Age: 79 y.o. Sex: female Date of Birth: Dec 08, 1933 Admit Date: 09/02/2012 Admitting Physician Rhetta Mura, MD [id number] DAVID, MD  Subjective: No abdominal pain. Diarrhea continues  Assessment/Plan: Active Problems: Acute Diverticulitis (4th episode) with 1.4 cm abscess.  -Appreciate general surgery and IR consultations  -IR consulted on 3/12-but unable to place perc drain  -was on Cipro/Flagyl since admission-but on 3/13 claimed that Cipro always causes diarrhea-therefore switched to Zosyn on 3/13 -belly is non tender-advanced to full liquids  Diarrhea -?from Cipro - C Diff PCR-neg - c/w Florastor  Hyponatremia -resolved-Na was 126 on admission-likely 2/2 to dehydration  HTN Controlled with Atenolol and losartan  Anxiety  On Xanax TID  Severe malnutrition in the context of chronic illness/Underweight  -continue with supplements  GERD -PPI -stable  Disposition: Remain inpatient  DVT Prophylaxis: Prophylactic Lovenox   Code Status: Full code   Procedures:  None  CONSULTS:  general surgery  PHYSICAL EXAM: Vital signs in last 24 hours: Filed Vitals:   09/04/12 1435 09/04/12 2126 09/05/12 0500 09/05/12 1038  BP: 116/65 135/66 114/63 125/70  Pulse: 70 65 63 73  Temp: 97.9 F (36.6 C) 99.1 F (37.3 C) 98.2 F (36.8 C) 98.3 F (36.8 C)  TempSrc: Oral Oral Oral Oral  Resp: 18 18 20 17   Height:      Weight:   46.9 kg (103 lb 6.3 oz)   SpO2: 100% 94% 97% 95%    Weight change:  Body mass index is 17.74 kg/(m^2).   Gen Exam: Awake and alert with clear speech.   Neck: Supple, No JVD.   Chest: B/L Clear.   CVS: S1 S2 Regular, no murmurs.  Abdomen: soft, BS +, non tender, non distended.  Extremities: no edema, lower extremities warm to touch. Neurologic: Non Focal.   Skin: No Rash.   Wounds: N/A.    Intake/Output from previous day:  Intake/Output Summary (Last 24 hours) at 09/05/12  1254 Last data filed at 09/05/12 0800  Gross per 24 hour  Intake   2063 ml  Output      0 ml  Net   2063 ml     LAB RESULTS: CBC  Recent Labs Lab 09/02/12 1205 09/03/12 0622 09/04/12 0510 09/05/12 0545  WBC 10.3 6.3 6.4 6.3  HGB 10.9* 10.5* 10.5* 10.7*  HCT 30.8* 30.1* 29.8* 30.8*  PLT 259 255 265 313  MCV 90.3 93.2 92.5 91.7  MCH 32.0 32.5 32.6 31.8  MCHC 35.4 34.9 35.2 34.7  RDW 12.3 12.2 12.1 12.4  LYMPHSABS 1.3  --   --   --   MONOABS 1.4*  --   --   --   EOSABS 0.1  --   --   --   BASOSABS 0.0  --   --   --     Chemistries   Recent Labs Lab 09/02/12 1205 09/03/12 0622 09/04/12 0510 09/05/12 0545  NA 126* 132* 135 136  K 4.0 4.1 3.7 4.0  CL 89* 96 99 99  CO2 25 28 28 31   GLUCOSE 96 99 109* 99  BUN 12 7 5* 5*  CREATININE 0.69 0.78 0.73 0.78  CALCIUM 9.0 8.8 8.8 9.0    CBG:  Recent Labs Lab 09/04/12 0121 09/04/12 0751 09/04/12 1632 09/04/12 2351 09/05/12 0751  GLUCAP 101* 117* 77 99 96    GFR Estimated Creatinine Clearance: 44.3 ml/min (by C-G formula based on Cr of 0.78).  Coagulation profile  Recent Labs Lab 09/03/12  0940  INR 1.13    Cardiac Enzymes No results found for this basename: CK, CKMB, TROPONINI, MYOGLOBIN,  in the last 168 hours  No components found with this basename: POCBNP,  No results found for this basename: DDIMER,  in the last 72 hours No results found for this basename: HGBA1C,  in the last 72 hours No results found for this basename: CHOL, HDL, LDLCALC, TRIG, CHOLHDL, LDLDIRECT,  in the last 72 hours No results found for this basename: TSH, T4TOTAL, FREET3, T3FREE, THYROIDAB,  in the last 72 hours No results found for this basename: VITAMINB12, FOLATE, FERRITIN, TIBC, IRON, RETICCTPCT,  in the last 72 hours No results found for this basename: LIPASE, AMYLASE,  in the last 72 hours  Urine Studies No results found for this basename: UACOL, UAPR, USPG, UPH, UTP, UGL, UKET, UBIL, UHGB, UNIT, UROB, ULEU, UEPI,  UWBC, URBC, UBAC, CAST, CRYS, UCOM, BILUA,  in the last 72 hours  MICROBIOLOGY: Recent Results (from the past 240 hour(s))  CLOSTRIDIUM DIFFICILE BY PCR     Status: None   Collection Time    09/04/12  3:34 PM      Result Value Range Status   C difficile by pcr NEGATIVE  NEGATIVE Final    RADIOLOGY STUDIES/RESULTS: Ct Pelvis Wo Contrast  09/03/2012  *RADIOLOGY REPORT*  Clinical Data: Known diverticular abscess within the left lower abdominal quadrant - please attempt CT guided percutaneous drainage  CT PELVIS WITHOUT CONTRAST  Technique:  Multidetector CT imaging of the pelvis was performed following the standard protocol without intravenous contrast.  Comparison: CT abdomen pelvis - 09/02/2012  Findings:  Limited noncontrast CT imaging of the lower pelvis demonstrates grossly unchanged appearance of note approximately 3.3 x 1.7 cm air and fluid containing diverticular abscess within the caudal aspect of the left hemi pelvis (image 12, series 2).  With the patient initially positioned supine an adequate percutaneous window was not able to be achieved even with placement of a Foley catheter to decompressthe urinary bladder.  As such, the patient was positioned prone on the CT gantry however repeat CT imaging failed to delineate and adequate percutaneous window for a drainage catheter placement.  As such, no drainage catheter was placed.  Note, previously ingested enteric contrast extends to the level of the rectum.  There is no discrete evidence of leakage of ingested enteric contrast into the left lower quadrant diverticular abscess.  IMPRESSION: Grossly unchanged approximately 3.3 cm air and fluid containing diverticular abscess within the left hemi abdomen not amendable to percutaneous drainage secondary to lack of adequate percutaneous window.  No drainage catheter placed.  Above findings discussed with Barnetta Chapel, surgical PA, at the time of procedure completion.   Original Report Authenticated  By: Tacey Ruiz, MD    Mr Lumbar Spine Wo Contrast  08/15/2012  *RADIOLOGY REPORT*  Clinical Data: Spinal stenosis.  Weakness.  Back pain.  Recent fall.  MRI LUMBAR SPINE WITHOUT CONTRAST  Technique:  Multiplanar and multiecho pulse sequences of the lumbar spine were obtained without intravenous contrast.  Comparison: 12/30/2011  Findings: There is no evidence of acute fracture in the region from lower T10-S3.  There is an old minor superior endplate deformity at L2 which is completely healed.  There are minimal non compressive disc bulges at T12-L1, L1-2 and L2-3.  There is posterior fusion from L3 to the sacrum.  There is anterior fusion at L4-5.  There is been previous posterior decompression in the lower lumbar region and in  the spinal canal is widely patent.  IMPRESSION: Previous decompression and fusion from L3 to the sacrum.  Wide patency of the canal and foramina in that region.  Minimal, non compressive disc bulges at T12-L1, L1-2 and L2-3.  No acute fracture.  Old minimal superior endplate deformity at L2.   Original Report Authenticated By: Paulina Fusi, M.D.    Ct Abdomen Pelvis W Contrast  09/02/2012  *RADIOLOGY REPORT*  Clinical Data: Lower abdominal pain.  Diverticulitis.  CT ABDOMEN AND PELVIS WITH CONTRAST  Technique:  Multidetector CT imaging of the abdomen and pelvis was performed following the standard protocol during bolus administration of intravenous contrast.  Contrast: 75mL OMNIPAQUE IOHEXOL 300 MG/ML  SOLN  Comparison: 12/21/2011  Findings: Increased bibasilar atelectasis.  Liver, gallbladder, spleen, kidneys, pancreas are stable.  Wall thickening of the sigmoid colon and surrounding fatty stranding has increased.  There is a 3.1 x 1.7 cm air and fluid- filled abscess just inferior to the sigmoid colon, deep in the left hemi pelvis, to the left of the bladder.  See image 69.  A few air bubbles anterior to the main abscess may represent a few locules of extraluminal bowel gas in an  adjacent tiny abscess.  There is otherwise no evidence of extraluminal bowel gas.  There is no second abscess.  Trace free fluid in the right posterior hemi pelvis.  Stable bladder.  Uterus absent.  Stable lumbar spine with postoperative changes and fusion.  Stable atherosclerotic changes involving the aorta and visceral vasculature.  Stable retroaortic left renal vein.  IMPRESSION: Interval development of acute diverticulitis and a small 3.1 cm abscess. Given the location, drainage of this abscess would be extremely difficult percutaneously.   Original Report Authenticated By: Jolaine Click, M.D.     MEDICATIONS: Scheduled Meds: . atenolol  100 mg Oral Daily  . cholecalciferol  2,000 Units Oral Daily  . enoxaparin (LOVENOX) injection  40 mg Subcutaneous Daily  . feeding supplement  237 mL Oral TID WC  . losartan  100 mg Oral QHS  . pantoprazole  40 mg Oral Daily  . piperacillin-tazobactam (ZOSYN)  IV  3.375 g Intravenous Q8H  . saccharomyces boulardii  250 mg Oral BID  . sodium chloride  3 mL Intravenous Q12H   Continuous Infusions: . dextrose 5 % and 0.9% NaCl 75 mL/hr at 09/04/12 2222   PRN Meds:.acetaminophen, ALPRAZolam, artificial tears, morphine injection, polyvinyl alcohol, promethazine  Antibiotics: Anti-infectives   Start     Dose/Rate Route Frequency Ordered Stop   09/04/12 1100  piperacillin-tazobactam (ZOSYN) IVPB 3.375 g     3.375 g 12.5 mL/hr over 240 Minutes Intravenous 3 times per day 09/04/12 0937     09/03/12 1000  ciprofloxacin (CIPRO) IVPB 400 mg  Status:  Discontinued     400 mg 200 mL/hr over 60 Minutes Intravenous Every 12 hours 09/03/12 0824 09/04/12 0937   09/03/12 1000  metroNIDAZOLE (FLAGYL) IVPB 500 mg  Status:  Discontinued     500 mg 100 mL/hr over 60 Minutes Intravenous Every 8 hours 09/03/12 0824 09/04/12 0937       Jeoffrey Massed, MD  [facility] Pager:[phone]  If 7PM-7AM, please contact  night-coverage [email or web address] Password [email or web address] 09/05/2012, 12:54 PM   LOS: 3 days

## 2012-09-05 NOTE — Progress Notes (Signed)
I have seen and examined the pt and agree with PA-Artus's note. Pt feels much better this afternoon. Awaiting lunch tray If tolerating PO well OK for DC today with 2wks abx and followup

## 2012-09-05 NOTE — Progress Notes (Signed)
Patient ID: Debra White, female   DOB: Nov 21, 1933, 79 y.o.   MRN: [id number]    Subjective: Pt says she doesn't feel well this morning.  Still with some diarrhea.  No pain.   Objective: Vital signs in last 24 hours: Temp:  [97.9 F (36.6 C)-99.1 F (37.3 C)] 98.2 F (36.8 C) (03/14 0500) Pulse Rate:  [63-70] 63 (03/14 0500) Resp:  [18-20] 20 (03/14 0500) BP: (114-135)/(63-66) 114/63 mmHg (03/14 0500) SpO2:  [94 %-100 %] 97 % (03/14 0500) Weight:  [103 lb 6.3 oz (46.9 kg)] 103 lb 6.3 oz (46.9 kg) (03/14 0500) Last BM Date: 09/04/12  Intake/Output from previous day: 03/13 0701 - 03/14 0700 In: 863 [P.O.:260; I.V.:603] Out: -  Intake/Output this shift:    PE: Abd: soft, NT, ND, +BS  Lab Results:   Recent Labs  09/04/12 0510 09/05/12 0545  WBC 6.4 6.3  HGB 10.5* 10.7*  HCT 29.8* 30.8*  PLT 265 313   BMET  Recent Labs  09/04/12 0510 09/05/12 0545  NA 135 136  K 3.7 4.0  CL 99 99  CO2 28 31  GLUCOSE 109* 99  BUN 5* 5*  CREATININE 0.73 0.78  CALCIUM 8.8 9.0   PT/INR  Recent Labs  09/03/12 0940  LABPROT 14.3  INR 1.13   CMP     Component Value Date/Time   NA 136 09/05/2012 0545   K 4.0 09/05/2012 0545   CL 99 09/05/2012 0545   CO2 31 09/05/2012 0545   GLUCOSE 99 09/05/2012 0545   BUN 5* 09/05/2012 0545   CREATININE 0.78 09/05/2012 0545   CALCIUM 9.0 09/05/2012 0545   PROT 6.4 09/03/2012 0622   ALBUMIN 3.0* 09/03/2012 0622   AST 12 09/03/2012 0622   ALT 13 09/03/2012 0622   ALKPHOS 62 09/03/2012 0622   BILITOT 0.4 09/03/2012 0622   GFRNONAA 79* 09/05/2012 0545   GFRAA >90 09/05/2012 0545   Lipase     Component Value Date/Time   LIPASE 41 09/02/2012 1205       Studies/Results: Ct Pelvis Wo Contrast  09/03/2012  *RADIOLOGY REPORT*  Clinical Data: Known diverticular abscess within the left lower abdominal quadrant - please attempt CT guided percutaneous drainage  CT PELVIS WITHOUT CONTRAST  Technique:  Multidetector CT imaging of the pelvis was  performed following the standard protocol without intravenous contrast.  Comparison: CT abdomen pelvis - 09/02/2012  Findings:  Limited noncontrast CT imaging of the lower pelvis demonstrates grossly unchanged appearance of note approximately 3.3 x 1.7 cm air and fluid containing diverticular abscess within the caudal aspect of the left hemi pelvis (image 12, series 2).  With the patient initially positioned supine an adequate percutaneous window was not able to be achieved even with placement of a Foley catheter to decompressthe urinary bladder.  As such, the patient was positioned prone on the CT gantry however repeat CT imaging failed to delineate and adequate percutaneous window for a drainage catheter placement.  As such, no drainage catheter was placed.  Note, previously ingested enteric contrast extends to the level of the rectum.  There is no discrete evidence of leakage of ingested enteric contrast into the left lower quadrant diverticular abscess.  IMPRESSION: Grossly unchanged approximately 3.3 cm air and fluid containing diverticular abscess within the left hemi abdomen not amendable to percutaneous drainage secondary to lack of adequate percutaneous window.  No drainage catheter placed.  Above findings discussed with Barnetta Chapel, surgical PA, at the time of procedure completion.  Original Report Authenticated By: Tacey Ruiz, MD     Anti-infectives: Anti-infectives   Start     Dose/Rate Route Frequency Ordered Stop   09/04/12 1100  piperacillin-tazobactam (ZOSYN) IVPB 3.375 g     3.375 g 12.5 mL/hr over 240 Minutes Intravenous 3 times per day 09/04/12 0937     09/03/12 1000  ciprofloxacin (CIPRO) IVPB 400 mg  Status:  Discontinued     400 mg 200 mL/hr over 60 Minutes Intravenous Every 12 hours 09/03/12 0824 09/04/12 0937   09/03/12 1000  metroNIDAZOLE (FLAGYL) IVPB 500 mg  Status:  Discontinued     500 mg 100 mL/hr over 60 Minutes Intravenous Every 8 hours 09/03/12 0824 09/04/12 0937        Assessment/Plan  1. Diverticulitis with microperforation, improving  Plan: 1. Patient is feeling better from an abdominal standpoint.  She feels poorly overall because of her diarrhea.  Her diet can be advanced today.  She has been advanced already to a soft diet.  2. She will need a low fiber diet at home 3. Will have nutrition come and speak to patient about diverticulosis diet for when she gets home. 4. Will need 2 more weeks of oral abx therapy at home. 5. Follow need to see Dr. Derrell Lolling in 2-4 weeks   LOS: 3 days    Buddenhagen,Deren Degrazia E 09/05/2012, 8:30 AM Pager: [phone]

## 2012-09-06 ENCOUNTER — Inpatient Hospital Stay (HOSPITAL_COMMUNITY): Payer: Medicare Other

## 2012-09-06 LAB — CBC
MCH: 32.1 pg (ref 26.0–34.0)
Platelets: 394 10*3/uL (ref 150–400)
RBC: 3.92 MIL/uL (ref 3.87–5.11)
WBC: 12 10*3/uL — ABNORMAL HIGH (ref 4.0–10.5)

## 2012-09-06 LAB — BASIC METABOLIC PANEL
Calcium: 9.7 mg/dL (ref 8.4–10.5)
GFR calc Af Amer: >90 mL/min (ref >90)
GFR calc non Af Amer: 83 mL/min — ABNORMAL LOW (ref >90)
Sodium: 135 mEq/L (ref 135–145)

## 2012-09-06 LAB — GLUCOSE, CAPILLARY
Glucose-Capillary: 118 mg/dL — ABNORMAL HIGH (ref 70–99)
Glucose-Capillary: 120 mg/dL — ABNORMAL HIGH (ref 70–99)
Glucose-Capillary: 77 mg/dL (ref 70–99)

## 2012-09-06 MED ORDER — GI COCKTAIL ~~LOC~~
30.0000 mL | Freq: Once | ORAL | Status: AC
  Administered 2012-09-06: 30 mL via ORAL
  Filled 2012-09-06: qty 30

## 2012-09-06 MED ORDER — KETOROLAC TROMETHAMINE 15 MG/ML IJ SOLN
15.0000 mg | Freq: Four times a day (QID) | INTRAMUSCULAR | Status: DC | PRN
  Administered 2012-09-06 – 2012-09-07 (×2): 15 mg via INTRAVENOUS
  Filled 2012-09-06 (×4): qty 1

## 2012-09-06 MED ORDER — DIPHENHYDRAMINE HCL 25 MG PO CAPS
25.0000 mg | ORAL_CAPSULE | Freq: Every evening | ORAL | Status: DC | PRN
  Administered 2012-09-06: 25 mg via ORAL
  Filled 2012-09-06: qty 1

## 2012-09-06 MED ORDER — HYDRALAZINE HCL 20 MG/ML IJ SOLN
10.0000 mg | Freq: Four times a day (QID) | INTRAMUSCULAR | Status: DC | PRN
  Filled 2012-09-06: qty 0.5

## 2012-09-06 MED ORDER — ATENOLOL 100 MG PO TABS
100.0000 mg | ORAL_TABLET | Freq: Every day | ORAL | Status: DC
  Administered 2012-09-06 – 2012-09-08 (×3): 100 mg via ORAL
  Filled 2012-09-06 (×3): qty 1

## 2012-09-06 MED ORDER — PSYLLIUM 95 % PO PACK
1.0000 | PACK | Freq: Every day | ORAL | Status: DC
  Administered 2012-09-07: 17:00:00 via ORAL
  Filled 2012-09-06 (×4): qty 1

## 2012-09-06 MED ORDER — ALPRAZOLAM 0.5 MG PO TABS
0.5000 mg | ORAL_TABLET | Freq: Three times a day (TID) | ORAL | Status: DC | PRN
  Administered 2012-09-06 – 2012-09-08 (×4): 1 mg via ORAL
  Filled 2012-09-06 (×4): qty 2

## 2012-09-06 MED ORDER — ONDANSETRON HCL 4 MG/2ML IJ SOLN: 4.0000 mg | Freq: Four times a day (QID) | INTRAMUSCULAR | Status: DC | PRN

## 2012-09-06 MED ORDER — HYOSCYAMINE SULFATE 0.125 MG SL SUBL
0.1250 mg | SUBLINGUAL_TABLET | SUBLINGUAL | Status: DC | PRN
  Filled 2012-09-06: qty 1

## 2012-09-06 MED ORDER — SIMETHICONE 40 MG/0.6ML PO SUSP
40.0000 mg | Freq: Four times a day (QID) | ORAL | Status: DC | PRN
  Administered 2012-09-06 – 2012-09-08 (×4): 40 mg via ORAL
  Filled 2012-09-06 (×4): qty 0.6

## 2012-09-06 MED ORDER — PANTOPRAZOLE SODIUM 40 MG IV SOLR
40.0000 mg | Freq: Every day | INTRAVENOUS | Status: DC
  Administered 2012-09-06 – 2012-09-08 (×3): 40 mg via INTRAVENOUS
  Filled 2012-09-06 (×3): qty 40

## 2012-09-06 NOTE — Progress Notes (Signed)
Pt complaining of loose stools that are more so water and some indigestion. RN paged rapid response and April to come to bedside.

## 2012-09-06 NOTE — Progress Notes (Signed)
Patient ID: Debra White, female   DOB: Jan 02, 1934, 79 y.o.   MRN: [id number]  General Surgery - [facility] - Progress Note  Subjective: Patient with episode of uncontrolled diarrhea last night.  Better this AM.  Not eating.  Family at bedside.  Denies pain.  Objective: Vital signs in last 24 hours: Temp:  [97.2 F (36.2 C)-98.6 F (37 C)] 97.2 F (36.2 C) (03/14 2118) Pulse Rate:  [73-79] 79 (03/15 0451) Resp:  [17-20] 18 (03/15 0451) BP: (124-176)/(63-78) 145/75 mmHg (03/15 0500) SpO2:  [95 %-100 %] 100 % (03/15 0451) Last BM Date: 09/05/12  Intake/Output from previous day: 03/14 0701 - 03/15 0700 In: 2510 [P.O.:660; I.V.:1800; IV Piggyback:50] Out: -   Exam: HEENT - clear, not icteric Neck - soft Chest - clear bilaterally Cor - RRR, no murmur Abd - soft without distension; non-tender; BS present Ext - no significant edema Neuro - grossly intact, no focal deficits  Lab Results:   Recent Labs  09/05/12 0545 09/06/12 0859  WBC 6.3 12.0*  HGB 10.7* 12.6  HCT 30.8* 36.6  PLT 313 394     Recent Labs  09/04/12 0510 09/05/12 0545  NA 135 136  K 3.7 4.0  CL 99 99  CO2 28 31  GLUCOSE 109* 99  BUN 5* 5*  CREATININE 0.73 0.78  CALCIUM 8.8 9.0    Studies/Results: Dg Abd 1 View  09/06/2012  *RADIOLOGY REPORT*  Clinical Data: Increased emesis  ABDOMEN - 1 VIEW  Comparison: 09/02/2012  Findings: Distention of small and large bowel loops are now scattered across the abdomen.  No obvious free intraperitoneal gas. Stable bony framework and postoperative changes.  IMPRESSION: Moderate ileus pattern.  No obvious free intraperitoneal gas.   Original Report Authenticated By: Jolaine Click, M.D.     Assessment / Plan: 1.  Acute diverticulitis with small abscess  IV Zosyn - convert to oral abx when tolerated  Check stool samples for Cdiff, culture ordered by medicine  May resume diet - clear liquids when OK with medical service  Will follow - no role  for acute surgical intervention at present  Velora Heckler, MD, [facility] Office: [phone]  09/06/2012

## 2012-09-06 NOTE — Progress Notes (Signed)
PATIENT DETAILS Name: Debra White Age: 79 y.o. Sex: female Date of Birth: March 26, 1934 Admit Date: 09/02/2012 Admitting Physician Rhetta Mura, MD [id number] DAVID, MD  Subjective: No abdominal pain. Significant diarrhea overnight. One episode of vomiting this am  Assessment/Plan: Active Problems: Acute Diverticulitis (4th episode) with 1.4 cm abscess.  -Appreciate general surgery and IR consultations  -IR consulted on 3/12-but unable to place perc drain  -was on Cipro/Flagyl since admission-but on 3/13 claimed that Cipro always causes diarrhea-therefore switched to Zosyn on 3/13 -belly is non tender  Diarrhea -apparently is a chronic issue for the patient-but had a worse bout last night - C Diff PCR-neg - c/w Florastor  ?Ileus -one episode of vomiting-Keep NPO for now-revaluate in a few hours to see if she can tolerate clear liquids -had numerous BM's -monitor for now-repeat Abd xray in am -if symp get worse-then will place NGT  Hyponatremia -resolved-Na was 126 on admission-likely 2/2 to dehydration  HTN Controlled with Atenolol and losartan  Anxiety  On Xanax TID  Severe malnutrition in the context of chronic illness/Underweight  -continue with supplements  GERD -PPI -stable  Disposition: Remain inpatient  DVT Prophylaxis: Prophylactic Lovenox   Code Status: Full code   Procedures:  None  CONSULTS:  general surgery  PHYSICAL EXAM: Vital signs in last 24 hours: Filed Vitals:   09/05/12 2118 09/06/12 0001 09/06/12 0451 09/06/12 0500  BP: 164/73 142/78 176/74 145/75  Pulse: 73 75 79   Temp: 97.2 F (36.2 C)     TempSrc: Oral     Resp: 20  18   Height:      Weight:      SpO2: 99%  100%     Weight change:  Body mass index is 17.74 kg/(m^2).   Gen Exam: Awake and alert with clear speech.   Neck: Supple, No JVD.   Chest: B/L Clear.   CVS: S1 S2 Regular, no murmurs.  Abdomen: soft, BS +, non tender, non distended.   Extremities: no edema, lower extremities warm to touch. Neurologic: Non Focal.   Skin: No Rash.   Wounds: N/A.    Intake/Output from previous day:  Intake/Output Summary (Last 24 hours) at 09/06/12 1044 Last data filed at 09/05/12 1900  Gross per 24 hour  Intake   1460 ml  Output      0 ml  Net   1460 ml     LAB RESULTS: CBC  Recent Labs Lab 09/02/12 1205 09/03/12 0622 09/04/12 0510 09/05/12 0545 09/06/12 0859  WBC 10.3 6.3 6.4 6.3 12.0*  HGB 10.9* 10.5* 10.5* 10.7* 12.6  HCT 30.8* 30.1* 29.8* 30.8* 36.6  PLT 259 255 265 313 394  MCV 90.3 93.2 92.5 91.7 93.4  MCH 32.0 32.5 32.6 31.8 32.1  MCHC 35.4 34.9 35.2 34.7 34.4  RDW 12.3 12.2 12.1 12.4 12.3  LYMPHSABS 1.3  --   --   --   --   MONOABS 1.4*  --   --   --   --   EOSABS 0.1  --   --   --   --   BASOSABS 0.0  --   --   --   --     Chemistries   Recent Labs Lab 09/02/12 1205 09/03/12 0622 09/04/12 0510 09/05/12 0545 09/06/12 0859  NA 126* 132* 135 136 135  K 4.0 4.1 3.7 4.0 4.1  CL 89* 96 99 99 98  CO2 25 28 28 31 27   GLUCOSE 96 99 109* 99  133*  BUN 12 7 5* 5* 6  CREATININE 0.69 0.78 0.73 0.78 0.67  CALCIUM 9.0 8.8 8.8 9.0 9.7    CBG:  Recent Labs Lab 09/04/12 2351 09/05/12 0751 09/05/12 1700 09/06/12 0012 09/06/12 0801  GLUCAP 99 96 132* 77 120*    GFR Estimated Creatinine Clearance: 44.3 ml/min (by C-G formula based on Cr of 0.67).  Coagulation profile  Recent Labs Lab 09/03/12 0940  INR 1.13    Cardiac Enzymes No results found for this basename: CK, CKMB, TROPONINI, MYOGLOBIN,  in the last 168 hours  No components found with this basename: POCBNP,  No results found for this basename: DDIMER,  in the last 72 hours No results found for this basename: HGBA1C,  in the last 72 hours No results found for this basename: CHOL, HDL, LDLCALC, TRIG, CHOLHDL, LDLDIRECT,  in the last 72 hours No results found for this basename: TSH, T4TOTAL, FREET3, T3FREE, THYROIDAB,  in the last 72  hours No results found for this basename: VITAMINB12, FOLATE, FERRITIN, TIBC, IRON, RETICCTPCT,  in the last 72 hours No results found for this basename: LIPASE, AMYLASE,  in the last 72 hours  Urine Studies No results found for this basename: UACOL, UAPR, USPG, UPH, UTP, UGL, UKET, UBIL, UHGB, UNIT, UROB, ULEU, UEPI, UWBC, URBC, UBAC, CAST, CRYS, UCOM, BILUA,  in the last 72 hours  MICROBIOLOGY: Recent Results (from the past 240 hour(s))  CLOSTRIDIUM DIFFICILE BY PCR     Status: None   Collection Time    09/04/12  3:34 PM      Result Value Range Status   C difficile by pcr NEGATIVE  NEGATIVE Final    RADIOLOGY STUDIES/RESULTS: Ct Pelvis Wo Contrast  09/03/2012  *RADIOLOGY REPORT*  Clinical Data: Known diverticular abscess within the left lower abdominal quadrant - please attempt CT guided percutaneous drainage  CT PELVIS WITHOUT CONTRAST  Technique:  Multidetector CT imaging of the pelvis was performed following the standard protocol without intravenous contrast.  Comparison: CT abdomen pelvis - 09/02/2012  Findings:  Limited noncontrast CT imaging of the lower pelvis demonstrates grossly unchanged appearance of note approximately 3.3 x 1.7 cm air and fluid containing diverticular abscess within the caudal aspect of the left hemi pelvis (image 12, series 2).  With the patient initially positioned supine an adequate percutaneous window was not able to be achieved even with placement of a Foley catheter to decompressthe urinary bladder.  As such, the patient was positioned prone on the CT gantry however repeat CT imaging failed to delineate and adequate percutaneous window for a drainage catheter placement.  As such, no drainage catheter was placed.  Note, previously ingested enteric contrast extends to the level of the rectum.  There is no discrete evidence of leakage of ingested enteric contrast into the left lower quadrant diverticular abscess.  IMPRESSION: Grossly unchanged approximately 3.3 cm  air and fluid containing diverticular abscess within the left hemi abdomen not amendable to percutaneous drainage secondary to lack of adequate percutaneous window.  No drainage catheter placed.  Above findings discussed with Barnetta Chapel, surgical PA, at the time of procedure completion.   Original Report Authenticated By: Tacey Ruiz, MD    Mr Lumbar Spine Wo Contrast  08/15/2012  *RADIOLOGY REPORT*  Clinical Data: Spinal stenosis.  Weakness.  Back pain.  Recent fall.  MRI LUMBAR SPINE WITHOUT CONTRAST  Technique:  Multiplanar and multiecho pulse sequences of the lumbar spine were obtained without intravenous contrast.  Comparison: 12/30/2011  Findings:  There is no evidence of acute fracture in the region from lower T10-S3.  There is an old minor superior endplate deformity at L2 which is completely healed.  There are minimal non compressive disc bulges at T12-L1, L1-2 and L2-3.  There is posterior fusion from L3 to the sacrum.  There is anterior fusion at L4-5.  There is been previous posterior decompression in the lower lumbar region and in the spinal canal is widely patent.  IMPRESSION: Previous decompression and fusion from L3 to the sacrum.  Wide patency of the canal and foramina in that region.  Minimal, non compressive disc bulges at T12-L1, L1-2 and L2-3.  No acute fracture.  Old minimal superior endplate deformity at L2.   Original Report Authenticated By: Paulina Fusi, M.D.    Ct Abdomen Pelvis W Contrast  09/02/2012  *RADIOLOGY REPORT*  Clinical Data: Lower abdominal pain.  Diverticulitis.  CT ABDOMEN AND PELVIS WITH CONTRAST  Technique:  Multidetector CT imaging of the abdomen and pelvis was performed following the standard protocol during bolus administration of intravenous contrast.  Contrast: 75mL OMNIPAQUE IOHEXOL 300 MG/ML  SOLN  Comparison: 12/21/2011  Findings: Increased bibasilar atelectasis.  Liver, gallbladder, spleen, kidneys, pancreas are stable.  Wall thickening of the sigmoid colon  and surrounding fatty stranding has increased.  There is a 3.1 x 1.7 cm air and fluid- filled abscess just inferior to the sigmoid colon, deep in the left hemi pelvis, to the left of the bladder.  See image 69.  A few air bubbles anterior to the main abscess may represent a few locules of extraluminal bowel gas in an adjacent tiny abscess.  There is otherwise no evidence of extraluminal bowel gas.  There is no second abscess.  Trace free fluid in the right posterior hemi pelvis.  Stable bladder.  Uterus absent.  Stable lumbar spine with postoperative changes and fusion.  Stable atherosclerotic changes involving the aorta and visceral vasculature.  Stable retroaortic left renal vein.  IMPRESSION: Interval development of acute diverticulitis and a small 3.1 cm abscess. Given the location, drainage of this abscess would be extremely difficult percutaneously.   Original Report Authenticated By: Jolaine Click, M.D.     MEDICATIONS: Scheduled Meds: . atenolol  100 mg Oral Daily  . cholecalciferol  2,000 Units Oral Daily  . enoxaparin (LOVENOX) injection  40 mg Subcutaneous Daily  . feeding supplement  237 mL Oral TID WC  . losartan  100 mg Oral QHS  . pantoprazole  40 mg Oral Daily  . piperacillin-tazobactam (ZOSYN)  IV  3.375 g Intravenous Q8H  . saccharomyces boulardii  250 mg Oral BID  . sodium chloride  3 mL Intravenous Q12H   Continuous Infusions: . dextrose 5 % and 0.9% NaCl 75 mL/hr at 09/05/12 1940   PRN Meds:.acetaminophen, ALPRAZolam, alum & mag hydroxide-simeth, artificial tears, diphenhydrAMINE, morphine injection, ondansetron, polyvinyl alcohol, promethazine  Antibiotics: Anti-infectives   Start     Dose/Rate Route Frequency Ordered Stop   09/04/12 1100  piperacillin-tazobactam (ZOSYN) IVPB 3.375 g     3.375 g 12.5 mL/hr over 240 Minutes Intravenous 3 times per day 09/04/12 0937     09/03/12 1000  ciprofloxacin (CIPRO) IVPB 400 mg  Status:  Discontinued     400 mg 200 mL/hr over 60  Minutes Intravenous Every 12 hours 09/03/12 0824 09/04/12 0937   09/03/12 1000  metroNIDAZOLE (FLAGYL) IVPB 500 mg  Status:  Discontinued     500 mg 100 mL/hr over 60 Minutes Intravenous Every 8  hours 09/03/12 0824 09/04/12 1610       Jeoffrey Massed, MD  [facility] Pager:[phone]  If 7PM-7AM, please contact night-coverage [email or web address] Password [facility] 09/06/2012, 10:44 AM   LOS: 4 days

## 2012-09-06 NOTE — Progress Notes (Signed)
PT complaining of not being able to sleep. RN paged MD on call Claiborne Billings who put in an order for pt to get benadryl.

## 2012-09-06 NOTE — Significant Event (Signed)
Rapid Response Event Note  Overview: Time Called: 0500 Arrival Time: 0502 Event Type: Other (Comment) Called by primary RN for RN consult for pt with cont. C/o abd distention, belching, & uncontrollable liquid stools  Initial Focused Assessment: On exam pt has hyperactive BS, abd is soft, without guarding.  Pt is having frequent belching & unable to hold down even small sips of liquid.  Pt is having small watery stools. Pt is A & O & denies abd pain.  She does state that she has heartburn after vomiting.  She states this problem is not new but it has been getting worse both in frequency & intensity.  Interventions:  KUB GI cocktail  Event Summary: Called to see pt that is waiting for a possible surgical intervention for her diverticular abscess.  Pt states her abd symptoms are worsening & her abd is becoming distended.   Pt doesn't appear in acute distress at this time.  Will cont. To monitor. Benedetto Coons, NP with [facility] updated & new orders rcvd.  Will await KUB results         Makella Buckingham Hedgecock

## 2012-09-07 ENCOUNTER — Inpatient Hospital Stay (HOSPITAL_COMMUNITY): Payer: Medicare Other

## 2012-09-07 DIAGNOSIS — R112 Nausea with vomiting, unspecified: Secondary | ICD-10-CM

## 2012-09-07 LAB — CBC
Hemoglobin: 10.6 g/dL — ABNORMAL LOW (ref 12.0–15.0)
MCHC: 34 g/dL (ref 30.0–36.0)

## 2012-09-07 LAB — BASIC METABOLIC PANEL
GFR calc Af Amer: >90 mL/min (ref >90)
GFR calc non Af Amer: 79 mL/min — ABNORMAL LOW (ref >90)
Glucose, Bld: 96 mg/dL (ref 70–99)
Potassium: 4.1 mEq/L (ref 3.5–5.1)
Sodium: 138 mEq/L (ref 135–145)

## 2012-09-07 LAB — GLUCOSE, CAPILLARY
Glucose-Capillary: 68 mg/dL — ABNORMAL LOW (ref 70–99)
Glucose-Capillary: 85 mg/dL (ref 70–99)

## 2012-09-07 MED ORDER — ENOXAPARIN SODIUM 30 MG/0.3ML ~~LOC~~ SOLN
30.0000 mg | Freq: Every day | SUBCUTANEOUS | Status: DC
  Administered 2012-09-08: 30 mg via SUBCUTANEOUS
  Filled 2012-09-07: qty 0.3

## 2012-09-07 MED ORDER — FLUCONAZOLE 150 MG PO TABS
150.0000 mg | ORAL_TABLET | Freq: Once | ORAL | Status: AC
  Administered 2012-09-07: 150 mg via ORAL
  Filled 2012-09-07: qty 1

## 2012-09-07 NOTE — Progress Notes (Addendum)
PATIENT DETAILS Name: Debra White Age: 79 y.o. Sex: female Date of Birth: [birth year] Admit Date: 09/02/2012 Admitting Physician Rhetta Mura, MD ZOX:[id number] DAVID, MD  Subjective: No abdominal pain. No vomiting. Diarrhea better  Assessment/Plan: Active Problems: Acute Diverticulitis (4th episode) with 1.4 cm abscess.  -Appreciate general surgery and IR consultations  -IR consulted on 3/12-but unable to place perc drain  -was on Cipro/Flagyl since admission-but on 3/13 claimed that Cipro always causes diarrhea-therefore switched to Zosyn on 3/13 -belly is non tender  Diarrhea -apparently is a chronic issue for the patient-but had a worse bout lon 3/15 - C Diff PCR-neg - c/w Florastor -this is a chronic issues-has had extensive w/u by Dr Jarold Motto in the past including colonoscopy with bx  ?Ileus -resolved -start clear liquid and advance to full liquids only  Hyponatremia -resolved-Na was 126 on admission-likely 2/2 to dehydration  HTN Controlled with Atenolol   Anxiety  On Xanax TID  Severe malnutrition in the context of chronic illness/Underweight  -continue with supplements  GERD -PPI -stable  Disposition: Remain inpatient  DVT Prophylaxis: Prophylactic Lovenox   Code Status: Full code   Procedures:  None  CONSULTS:  general surgery  PHYSICAL EXAM: Vital signs in last 24 hours: Filed Vitals:   09/06/12 1434 09/06/12 1454 09/06/12 1952 09/07/12 0559  BP: 138/64  138/52 128/44  Pulse: 81  80 66  Temp: 99.6 F (37.6 C)  98.8 F (37.1 C) 98.8 F (37.1 C)  TempSrc:   Oral Oral  Resp: 16  16 16   Height:      Weight:  43.681 kg (96 lb 4.8 oz)    SpO2: 99%  100% 98%    Weight change:  Body mass index is 16.52 kg/(m^2).   Gen Exam: Awake and alert with clear speech.   Neck: Supple, No JVD.   Chest: B/L Clear.   CVS: S1 S2 Regular, no murmurs.  Abdomen: soft, BS +, non tender, non distended.  Extremities: no edema, lower  extremities warm to touch. Neurologic: Non Focal.   Skin: No Rash.   Wounds: N/A.    Intake/Output from previous day:  Intake/Output Summary (Last 24 hours) at 09/07/12 1103 Last data filed at 09/06/12 2300  Gross per 24 hour  Intake  897.5 ml  Output      0 ml  Net  897.5 ml     LAB RESULTS: CBC  Recent Labs Lab 09/02/12 1205 09/03/12 0622 09/04/12 0510 09/05/12 0545 09/06/12 0859 09/07/12 0526  WBC 10.3 6.3 6.4 6.3 12.0* 6.6  HGB 10.9* 10.5* 10.5* 10.7* 12.6 10.6*  HCT 30.8* 30.1* 29.8* 30.8* 36.6 31.2*  PLT 259 255 265 313 394 328  MCV 90.3 93.2 92.5 91.7 93.4 94.8  MCH 32.0 32.5 32.6 31.8 32.1 32.2  MCHC 35.4 34.9 35.2 34.7 34.4 34.0  RDW 12.3 12.2 12.1 12.4 12.3 12.7  LYMPHSABS 1.3  --   --   --   --   --   MONOABS 1.4*  --   --   --   --   --   EOSABS 0.1  --   --   --   --   --   BASOSABS 0.0  --   --   --   --   --     Chemistries   Recent Labs Lab 09/03/12 0622 09/04/12 0510 09/05/12 0545 09/06/12 0859 09/07/12 0526  NA 132* 135 136 135 138  K 4.1 3.7 4.0 4.1 4.1  CL 96  99 99 98 105  CO2 28 28 31 27 25   GLUCOSE 99 109* 99 133* 96  BUN 7 5* 5* 6 7  CREATININE 0.78 0.73 0.78 0.67 0.78  CALCIUM 8.8 8.8 9.0 9.7 8.6    CBG:  Recent Labs Lab 09/06/12 0012 09/06/12 0801 09/06/12 1432 09/06/12 2224 09/07/12 0701  GLUCAP 77 120* 118* 107* 92    GFR Estimated Creatinine Clearance: 41.3 ml/min (by C-G formula based on Cr of 0.78).  Coagulation profile  Recent Labs Lab 09/03/12 0940  INR 1.13    Cardiac Enzymes No results found for this basename: CK, CKMB, TROPONINI, MYOGLOBIN,  in the last 168 hours  No components found with this basename: POCBNP,  No results found for this basename: DDIMER,  in the last 72 hours No results found for this basename: HGBA1C,  in the last 72 hours No results found for this basename: CHOL, HDL, LDLCALC, TRIG, CHOLHDL, LDLDIRECT,  in the last 72 hours No results found for this basename: TSH, T4TOTAL,  FREET3, T3FREE, THYROIDAB,  in the last 72 hours No results found for this basename: VITAMINB12, FOLATE, FERRITIN, TIBC, IRON, RETICCTPCT,  in the last 72 hours No results found for this basename: LIPASE, AMYLASE,  in the last 72 hours  Urine Studies No results found for this basename: UACOL, UAPR, USPG, UPH, UTP, UGL, UKET, UBIL, UHGB, UNIT, UROB, ULEU, UEPI, UWBC, URBC, UBAC, CAST, CRYS, UCOM, BILUA,  in the last 72 hours  MICROBIOLOGY: Recent Results (from the past 240 hour(s))  CLOSTRIDIUM DIFFICILE BY PCR     Status: None   Collection Time    09/04/12  3:34 PM      Result Value Range Status   C difficile by pcr NEGATIVE  NEGATIVE Final  STOOL CULTURE     Status: None   Collection Time    09/04/12  3:34 PM      Result Value Range Status   Specimen Description STOOL   Final   Special Requests NONE   Final   Culture     Final   Value: NO SUSPICIOUS COLONIES, CONTINUING TO HOLD     Note: REDUCED NORMAL FLORA PRESENT   Report Status PENDING   Incomplete    RADIOLOGY STUDIES/RESULTS: Ct Pelvis Wo Contrast  09/03/2012  *RADIOLOGY REPORT*  Clinical Data: Known diverticular abscess within the left lower abdominal quadrant - please attempt CT guided percutaneous drainage  CT PELVIS WITHOUT CONTRAST  Technique:  Multidetector CT imaging of the pelvis was performed following the standard protocol without intravenous contrast.  Comparison: CT abdomen pelvis - 09/02/2012  Findings:  Limited noncontrast CT imaging of the lower pelvis demonstrates grossly unchanged appearance of note approximately 3.3 x 1.7 cm air and fluid containing diverticular abscess within the caudal aspect of the left hemi pelvis (image 12, series 2).  With the patient initially positioned supine an adequate percutaneous window was not able to be achieved even with placement of a Foley catheter to decompressthe urinary bladder.  As such, the patient was positioned prone on the CT gantry however repeat CT imaging failed to  delineate and adequate percutaneous window for a drainage catheter placement.  As such, no drainage catheter was placed.  Note, previously ingested enteric contrast extends to the level of the rectum.  There is no discrete evidence of leakage of ingested enteric contrast into the left lower quadrant diverticular abscess.  IMPRESSION: Grossly unchanged approximately 3.3 cm air and fluid containing diverticular abscess within the left hemi abdomen  not amendable to percutaneous drainage secondary to lack of adequate percutaneous window.  No drainage catheter placed.  Above findings discussed with Barnetta Chapel, surgical PA, at the time of procedure completion.   Original Report Authenticated By: Tacey Ruiz, MD    Mr Lumbar Spine Wo Contrast  08/15/2012  *RADIOLOGY REPORT*  Clinical Data: Spinal stenosis.  Weakness.  Back pain.  Recent fall.  MRI LUMBAR SPINE WITHOUT CONTRAST  Technique:  Multiplanar and multiecho pulse sequences of the lumbar spine were obtained without intravenous contrast.  Comparison: 12/30/2011  Findings: There is no evidence of acute fracture in the region from lower T10-S3.  There is an old minor superior endplate deformity at L2 which is completely healed.  There are minimal non compressive disc bulges at T12-L1, L1-2 and L2-3.  There is posterior fusion from L3 to the sacrum.  There is anterior fusion at L4-5.  There is been previous posterior decompression in the lower lumbar region and in the spinal canal is widely patent.  IMPRESSION: Previous decompression and fusion from L3 to the sacrum.  Wide patency of the canal and foramina in that region.  Minimal, non compressive disc bulges at T12-L1, L1-2 and L2-3.  No acute fracture.  Old minimal superior endplate deformity at L2.   Original Report Authenticated By: Paulina Fusi, M.D.    Ct Abdomen Pelvis W Contrast  09/02/2012  *RADIOLOGY REPORT*  Clinical Data: Lower abdominal pain.  Diverticulitis.  CT ABDOMEN AND PELVIS WITH CONTRAST   Technique:  Multidetector CT imaging of the abdomen and pelvis was performed following the standard protocol during bolus administration of intravenous contrast.  Contrast: 75mL OMNIPAQUE IOHEXOL 300 MG/ML  SOLN  Comparison: 12/21/2011  Findings: Increased bibasilar atelectasis.  Liver, gallbladder, spleen, kidneys, pancreas are stable.  Wall thickening of the sigmoid colon and surrounding fatty stranding has increased.  There is a 3.1 x 1.7 cm air and fluid- filled abscess just inferior to the sigmoid colon, deep in the left hemi pelvis, to the left of the bladder.  See image 69.  A few air bubbles anterior to the main abscess may represent a few locules of extraluminal bowel gas in an adjacent tiny abscess.  There is otherwise no evidence of extraluminal bowel gas.  There is no second abscess.  Trace free fluid in the right posterior hemi pelvis.  Stable bladder.  Uterus absent.  Stable lumbar spine with postoperative changes and fusion.  Stable atherosclerotic changes involving the aorta and visceral vasculature.  Stable retroaortic left renal vein.  IMPRESSION: Interval development of acute diverticulitis and a small 3.1 cm abscess. Given the location, drainage of this abscess would be extremely difficult percutaneously.   Original Report Authenticated By: Jolaine Click, M.D.     MEDICATIONS: Scheduled Meds: . atenolol  100 mg Oral Daily  . enoxaparin (LOVENOX) injection  40 mg Subcutaneous Daily  . feeding supplement  237 mL Oral TID WC  . pantoprazole (PROTONIX) IV  40 mg Intravenous Q1200  . piperacillin-tazobactam (ZOSYN)  IV  3.375 g Intravenous Q8H  . psyllium  1 packet Oral Q1200  . saccharomyces boulardii  250 mg Oral BID  . sodium chloride  3 mL Intravenous Q12H   Continuous Infusions: . dextrose 5 % and 0.9% NaCl 75 mL/hr at 09/06/12 1322   PRN Meds:.acetaminophen, ALPRAZolam, artificial tears, hydrALAZINE, hyoscyamine, ketorolac, morphine injection, ondansetron, polyvinyl alcohol,  simethicone  Antibiotics: Anti-infectives   Start     Dose/Rate Route Frequency Ordered Stop   09/04/12  1100  piperacillin-tazobactam (ZOSYN) IVPB 3.375 g     3.375 g 12.5 mL/hr over 240 Minutes Intravenous 3 times per day 09/04/12 0937     09/03/12 1000  ciprofloxacin (CIPRO) IVPB 400 mg  Status:  Discontinued     400 mg 200 mL/hr over 60 Minutes Intravenous Every 12 hours 09/03/12 0824 09/04/12 0937   09/03/12 1000  metroNIDAZOLE (FLAGYL) IVPB 500 mg  Status:  Discontinued     500 mg 100 mL/hr over 60 Minutes Intravenous Every 8 hours 09/03/12 0824 09/04/12 0937       Jeoffrey Massed, MD  [facility] Pager:[phone]  If 7PM-7AM, please contact night-coverage [email or web address] Password [email or web address] 09/07/2012, 11:03 AM   LOS: 5 days

## 2012-09-07 NOTE — Progress Notes (Signed)
Patient ID: Debra White, female   DOB: July 01, 1933, 79 y.o.   MRN: [id number]  General Surgery - [facility] - Progress Note  Subjective: Patient up in chair.  Slept well overnight.  No further diarrhea.  Family at bedside.  Objective: Vital signs in last 24 hours: Temp:  [98.8 F (37.1 C)-99.6 F (37.6 C)] 98.8 F (37.1 C) (03/16 0559) Pulse Rate:  [66-81] 66 (03/16 0559) Resp:  [16] 16 (03/16 0559) BP: (128-138)/(44-64) 128/44 mmHg (03/16 0559) SpO2:  [98 %-100 %] 98 % (03/16 0559) Weight:  [96 lb 4.8 oz (43.681 kg)] 96 lb 4.8 oz (43.681 kg) (03/15 1454) Last BM Date: 09/06/12  Intake/Output from previous day: 03/15 0701 - 03/16 0700 In: 897.5 [I.V.:797.5; IV Piggyback:100] Out: -   Exam: HEENT - clear, not icteric Neck - soft Chest - clear bilaterally Cor - RRR, no murmur Abd - soft without distension; BS present; non-tender Ext - no significant edema Neuro - grossly intact, no focal deficits  Lab Results:   Recent Labs  09/06/12 0859 09/07/12 0526  WBC 12.0* 6.6  HGB 12.6 10.6*  HCT 36.6 31.2*  PLT 394 328     Recent Labs  09/06/12 0859 09/07/12 0526  NA 135 138  K 4.1 4.1  CL 98 105  CO2 27 25  GLUCOSE 133* 96  BUN 6 7  CREATININE 0.67 0.78  CALCIUM 9.7 8.6    Studies/Results: Dg Abd 1 View  09/06/2012  *RADIOLOGY REPORT*  Clinical Data: Increased emesis  ABDOMEN - 1 VIEW  Comparison: 09/02/2012  Findings: Distention of small and large bowel loops are now scattered across the abdomen.  No obvious free intraperitoneal gas. Stable bony framework and postoperative changes.  IMPRESSION: Moderate ileus pattern.  No obvious free intraperitoneal gas.   Original Report Authenticated By: Jolaine Click, M.D.     Assessment / Plan: 1. Acute diverticulitis with small abscess   IV Zosyn - convert to oral abx when tolerated   Stool sample negative for Cdiff   May resume diet when OK with medical service   Will follow - no role for acute  surgical intervention at present  Consider GI consult - Dr. Sheryn Bison, [place] - if diarrhea persists - chronic issue  Velora Heckler, MD, [facility] Office: [phone]  09/07/2012

## 2012-09-08 LAB — GLUCOSE, CAPILLARY

## 2012-09-08 LAB — STOOL CULTURE

## 2012-09-08 MED ORDER — METRONIDAZOLE 500 MG PO TABS: 500.0000 mg | ORAL_TABLET | Freq: Three times a day (TID) | ORAL | Status: DC

## 2012-09-08 MED ORDER — SIMETHICONE 40 MG/0.6ML PO SUSP: 40.0000 mg | Freq: Four times a day (QID) | ORAL | Status: DC | PRN

## 2012-09-08 MED ORDER — LEVOFLOXACIN 500 MG PO TABS: 500.0000 mg | ORAL_TABLET | Freq: Every day | ORAL | Status: DC

## 2012-09-08 MED ORDER — PSYLLIUM 95 % PO PACK: 1.0000 | PACK | Freq: Every day | ORAL | Status: DC

## 2012-09-08 MED ORDER — ENSURE COMPLETE PO LIQD: 237.0000 mL | Freq: Three times a day (TID) | ORAL | Status: DC

## 2012-09-08 MED ORDER — SACCHAROMYCES BOULARDII 250 MG PO CAPS: 250.0000 mg | ORAL_CAPSULE | Freq: Two times a day (BID) | ORAL | Status: DC

## 2012-09-08 NOTE — Progress Notes (Signed)
D/C instructions and scripts given. Pt verbalized understanding of low fiber diet and new meds.

## 2012-09-08 NOTE — Discharge Summary (Addendum)
PATIENT DETAILS Name: Donnamae White Age: 79 y.o. Sex: female Date of Birth: 09-29-1933 MRN: [id number]. Admit Date: 09/02/2012 Admitting Physician: Rhetta Mura, MD JYN:[id number] DAVID, MD  Recommendations for Outpatient Follow-up:  1. Patient will need close followup with general surgery 2. Patient has been asked to stay either on a full liquid diet or a soft mechanical diet for the next 2 weeks  PRIMARY DISCHARGE DIAGNOSIS:  Active Problems: Acute Diverticulitis (4th episode) with 1.4 cm abscess.  Ileus Chronic diarrhea Dyspepsia Hyponatremia Hypertension Anxiety      PAST MEDICAL HISTORY: Past Medical History  Diagnosis Date  . Hypertension   . Hyperlipemia   . Fibromyalgia   . Anxiety   . Dysrhythmia     RX  . Blood transfusion   . Chronic kidney disease     STONES  . GERD (gastroesophageal reflux disease)   . Headache   . Arthritis   . Cataract   . Diverticulosis of colon (without mention of hemorrhage) 2007    Colonoscopy  . Candida esophagitis 2013    EGD  . Family history of colon cancer     sister  . Emphysema of lung     DISCHARGE MEDICATIONS:   Medication List    STOP taking these medications       ciprofloxacin 500 MG tablet  Commonly known as:  CIPRO     losartan 100 MG tablet  Commonly known as:  COZAAR      TAKE these medications       ALPRAZolam 1 MG tablet  Commonly known as:  XANAX  Take 0.5-1 mg by mouth 3 (three) times daily as needed. For anxiety     atenolol 100 MG tablet  Commonly known as:  TENORMIN  Take 100 mg by mouth every morning.     cholecalciferol 1000 UNITS tablet  Commonly known as:  VITAMIN D  Take 2,000 Units by mouth daily.     feeding supplement Liqd  Take 237 mLs by mouth 3 (three) times daily with meals.     hyoscyamine 0.125 MG SL tablet  Commonly known as:  LEVSIN SL  Place 0.125 mg under the tongue every 4 (four) hours as needed for cramping (nausea).     levofloxacin 500 MG tablet   Commonly known as:  LEVAQUIN  Take 1 tablet (500 mg total) by mouth daily.     metroNIDAZOLE 500 MG tablet  Commonly known as:  FLAGYL  Take 1 tablet (500 mg total) by mouth 3 (three) times daily. Starting 08/29/12 for 10 days     multivitamins ther. w/minerals Tabs  Take 1 tablet by mouth daily.     pantoprazole 40 MG tablet  Commonly known as:  PROTONIX  Take 40 mg by mouth daily.     pravastatin 40 MG tablet  Commonly known as:  PRAVACHOL  Take 40 mg by mouth daily.     saccharomyces boulardii 250 MG capsule  Commonly known as:  FLORASTOR  Take 1 capsule (250 mg total) by mouth 2 (two) times daily.     simethicone 40 MG/0.6ML drops  Commonly known as:  MYLICON  Take 0.6 mLs (40 mg total) by mouth 4 (four) times daily as needed.         BRIEF HPI:  See H&P, Labs, Consult and Test reports for all details in brief, patient is a 79 year old Caucasian female who was brought into the hospital for abdominal pain, apparently she was seen by her primary care practitioner few  days prior to this hospitalization was started on empiric Cipro and Flagyl.She was evaluated in the found to have a diverticular abscess.  CONSULTATIONS:   general surgery  PERTINENT RADIOLOGIC STUDIES: Dg Abd 1 View  09/06/2012  *RADIOLOGY REPORT*  Clinical Data: Increased emesis  ABDOMEN - 1 VIEW  Comparison: 09/02/2012  Findings: Distention of small and large bowel loops are now scattered across the abdomen.  No obvious free intraperitoneal gas. Stable bony framework and postoperative changes.  IMPRESSION: Moderate ileus pattern.  No obvious free intraperitoneal gas.   Original Report Authenticated By: Jolaine Click, M.D.    Ct Pelvis Wo Contrast  09/03/2012  *RADIOLOGY REPORT*  Clinical Data: Known diverticular abscess within the left lower abdominal quadrant - please attempt CT guided percutaneous drainage  CT PELVIS WITHOUT CONTRAST  Technique:  Multidetector CT imaging of the pelvis was performed following  the standard protocol without intravenous contrast.  Comparison: CT abdomen pelvis - 09/02/2012  Findings:  Limited noncontrast CT imaging of the lower pelvis demonstrates grossly unchanged appearance of note approximately 3.3 x 1.7 cm air and fluid containing diverticular abscess within the caudal aspect of the left hemi pelvis (image 12, series 2).  With the patient initially positioned supine an adequate percutaneous window was not able to be achieved even with placement of a Foley catheter to decompressthe urinary bladder.  As such, the patient was positioned prone on the CT gantry however repeat CT imaging failed to delineate and adequate percutaneous window for a drainage catheter placement.  As such, no drainage catheter was placed.  Note, previously ingested enteric contrast extends to the level of the rectum.  There is no discrete evidence of leakage of ingested enteric contrast into the left lower quadrant diverticular abscess.  IMPRESSION: Grossly unchanged approximately 3.3 cm air and fluid containing diverticular abscess within the left hemi abdomen not amendable to percutaneous drainage secondary to lack of adequate percutaneous window.  No drainage catheter placed.  Above findings discussed with Debra White, surgical PA, at the time of procedure completion.   Original Report Authenticated By: Tacey Ruiz, MD    Mr Lumbar Spine Wo Contrast  08/15/2012  *RADIOLOGY REPORT*  Clinical Data: Spinal stenosis.  Weakness.  Back pain.  Recent fall.  MRI LUMBAR SPINE WITHOUT CONTRAST  Technique:  Multiplanar and multiecho pulse sequences of the lumbar spine were obtained without intravenous contrast.  Comparison: 12/30/2011  Findings: There is no evidence of acute fracture in the region from lower T10-S3.  There is an old minor superior endplate deformity at L2 which is completely healed.  There are minimal non compressive disc bulges at T12-L1, L1-2 and L2-3.  There is posterior fusion from L3 to the  sacrum.  There is anterior fusion at L4-5.  There is been previous posterior decompression in the lower lumbar region and in the spinal canal is widely patent.  IMPRESSION: Previous decompression and fusion from L3 to the sacrum.  Wide patency of the canal and foramina in that region.  Minimal, non compressive disc bulges at T12-L1, L1-2 and L2-3.  No acute fracture.  Old minimal superior endplate deformity at L2.   Original Report Authenticated By: Paulina Fusi, M.D.    Ct Abdomen Pelvis W Contrast  09/02/2012  *RADIOLOGY REPORT*  Clinical Data: Lower abdominal pain.  Diverticulitis.  CT ABDOMEN AND PELVIS WITH CONTRAST  Technique:  Multidetector CT imaging of the abdomen and pelvis was performed following the standard protocol during bolus administration of intravenous contrast.  Contrast:  75mL OMNIPAQUE IOHEXOL 300 MG/ML  SOLN  Comparison: 12/21/2011  Findings: Increased bibasilar atelectasis.  Liver, gallbladder, spleen, kidneys, pancreas are stable.  Wall thickening of the sigmoid colon and surrounding fatty stranding has increased.  There is a 3.1 x 1.7 cm air and fluid- filled abscess just inferior to the sigmoid colon, deep in the left hemi pelvis, to the left of the bladder.  See image 69.  A few air bubbles anterior to the main abscess may represent a few locules of extraluminal bowel gas in an adjacent tiny abscess.  There is otherwise no evidence of extraluminal bowel gas.  There is no second abscess.  Trace free fluid in the right posterior hemi pelvis.  Stable bladder.  Uterus absent.  Stable lumbar spine with postoperative changes and fusion.  Stable atherosclerotic changes involving the aorta and visceral vasculature.  Stable retroaortic left renal vein.  IMPRESSION: Interval development of acute diverticulitis and a small 3.1 cm abscess. Given the location, drainage of this abscess would be extremely difficult percutaneously.   Original Report Authenticated By: Jolaine Click, M.D.    Dg Abd 2  Views  09/07/2012  *RADIOLOGY REPORT*  Clinical Data: Diarrhea and vomiting.  Small bowel obstruction.  ABDOMEN - 2 VIEW  Comparison: 03/15  Findings: Bowel gas pattern is within normal limits.  No dilated loops of small or large bowel.  No free air.  No worrisome calcifications or acute bony findings.  Previous lumbar decompression and fusion again noted.  IMPRESSION: Gas pattern within normal limits today.   Original Report Authenticated By: Paulina Fusi, M.D.      PERTINENT LAB RESULTS: CBC:  Recent Labs  09/06/12 0859 09/07/12 0526  WBC 12.0* 6.6  HGB 12.6 10.6*  HCT 36.6 31.2*  PLT 394 328   CMET CMP     Component Value Date/Time   NA 138 09/07/2012 0526   K 4.1 09/07/2012 0526   CL 105 09/07/2012 0526   CO2 25 09/07/2012 0526   GLUCOSE 96 09/07/2012 0526   BUN 7 09/07/2012 0526   CREATININE 0.78 09/07/2012 0526   CALCIUM 8.6 09/07/2012 0526   PROT 6.4 09/03/2012 0622   ALBUMIN 3.0* 09/03/2012 0622   AST 12 09/03/2012 0622   ALT 13 09/03/2012 0622   ALKPHOS 62 09/03/2012 0622   BILITOT 0.4 09/03/2012 0622   GFRNONAA 79* 09/07/2012 0526   GFRAA >90 09/07/2012 0526    GFR Estimated Creatinine Clearance: 41.3 ml/min (by C-G formula based on Cr of 0.78). No results found for this basename: LIPASE, AMYLASE,  in the last 72 hours No results found for this basename: CKTOTAL, CKMB, CKMBINDEX, TROPONINI,  in the last 72 hours No components found with this basename: POCBNP,  No results found for this basename: DDIMER,  in the last 72 hours No results found for this basename: HGBA1C,  in the last 72 hours No results found for this basename: CHOL, HDL, LDLCALC, TRIG, CHOLHDL, LDLDIRECT,  in the last 72 hours No results found for this basename: TSH, T4TOTAL, FREET3, T3FREE, THYROIDAB,  in the last 72 hours No results found for this basename: VITAMINB12, FOLATE, FERRITIN, TIBC, IRON, RETICCTPCT,  in the last 72 hours Coags: No results found for this basename: PT, INR,  in the last 72  hours Microbiology: Recent Results (from the past 240 hour(s))  CLOSTRIDIUM DIFFICILE BY PCR     Status: None   Collection Time    09/04/12  3:34 PM      Result Value Range  Status   C difficile by pcr NEGATIVE  NEGATIVE Final  STOOL CULTURE     Status: None   Collection Time    09/04/12  3:34 PM      Result Value Range Status   Specimen Description STOOL   Final   Special Requests NONE   Final   Culture     Final   Value: ABUNDANT YEAST     NO SALMONELLA, SHIGELLA, CAMPYLOBACTER, YERSINIA, OR E.COLI 0157:H7 ISOLATED     Note: REDUCED NORMAL FLORA PRESENT   Report Status 09/08/2012 FINAL   Final     BRIEF HOSPITAL COURSE:   Active Problems:  Acute Diverticulitis (4th episode) with 1.4 cm abscess.  - Patient was admitted, and empirically started on ciprofloxacin and Flagyl, initially she was kept n.p.o. Interventional radiology was consulted on on 3/12-but unable to place perc drain because of inadequate window  - Diet was slowly advanced, she tolerated a full liquid diet, however when it was to dysphagia 2 diet and patient ate a bite of a chicken sandwich she developed perfuse loose stools and a few episodes of vomiting. She was then kept n.p.o., a x-ray of the abdomen demonstrated mild ileus. She was slowly resumed back on clear liquids which she tolerated, and she is now been advanced to a full liquid diet. It is deemed that she is stable enough to be discharged home, she has been asked to stay on a full liquid to a soft mechanical diet as tolerated till see she's general surgery in the next week or so. was on Cipro/Flagyl since admission-but on 3/13 claimed that Cipro always causes diarrhea-therefore switched to Zosyn on 3/13- she will be transitioned back to Levaquin and Flagyl for another 12 days. - Was followed by general surgery throughout her admission, it was deemed that she was not a surgical candidate for any sort of surgery, plans were to see if conservative therapy with  antibiotics would work in this instance-hopefully it will would, she would then get definite surgery done in the outpatient setting. - On day of discharge, and 1-2 days prior to discharge patient has been complaining a lot of dyspeptic symptoms-mostly "gas"-this is easily been treated with simethicone. She has been provided a prescription for this as well. Please note she has had no vomiting for 48 hours. Her belly is very soft and nontender. Patient does have significant anxiety issues, it is believed that this acute illness has made her anxiety issues worse, I have tried to reassure her today as well. - Patient and her family have been repeatedly told by me that upon discharge if she were to have worsening abdominal pain or fever she needed to seek immediate medical attention. They have claimed understanding  Diarrhea  -apparently is a chronic issue for the patient-but had a worse bout on 3/15  - C Diff PCR-neg  - c/w Florastor  - Currently it is at her usual baseline -this is a chronic issues-has had extensive w/u by Dr Jarold Motto in the past including colonoscopy with bx - Patient been asked to keep up with fluids to prevent dehydration-she is very familiar with this issue  Ileus  -resolved  - Tolerating clear and full liquids  Hyponatremia  -resolved-Na was 126 on admission-likely 2/2 to dehydration   HTN  -Controlled with Atenolol   Anxiety  -On Xanax TID  - See above  Severe malnutrition in the context of chronic illness/Underweight  -continue with supplements  TODAY-DAY OF DISCHARGE:  Subjective:  Debra White today has no headache,no chest abdominal pain,no new weakness tingling or numbness, she has had no further nausea or vomiting and is tolerating a full liquid diet. She is stable to be discharged home Objective:   Blood pressure 122/47, pulse 53, temperature 97.6 F (36.4 C), temperature source Oral, resp. rate 18, height 5\' 4"  (1.626 m), weight 43.681 kg (96 lb 4.8  oz), SpO2 98.00%.  Intake/Output Summary (Last 24 hours) at 09/08/12 1620 Last data filed at 09/07/12 2148  Gross per 24 hour  Intake   2180 ml  Output      0 ml  Net   2180 ml    Exam Awake Alert, Oriented *3, No new F.N deficits, Normal affect [place].AT,PERRAL Supple Neck,No JVD, No cervical lymphadenopathy appriciated.  Symmetrical Chest wall movement, Good air movement bilaterally, CTAB RRR,No Gallops,Rubs or new Murmurs, No Parasternal Heave +ve B.Sounds, Abd Soft, Non tender, No organomegaly appriciated, No rebound -guarding or rigidity. No Cyanosis, Clubbing or edema, No new Rash or bruise  DISCHARGE CONDITION: Stable  DISPOSITION: HOME  DISCHARGE INSTRUCTIONS:    Activity:  As tolerated  Diet recommendation: Full liquid to a soft mechanical diet  Follow-up Information   Follow up with Lajean Saver, MD. Schedule an appointment as soon as possible for a visit in 3 weeks.   Contact information:   [address] [place] [address] [phone]       Follow up with Nadean Corwin, MD. Schedule an appointment as soon as possible for a visit in 1 week.   Contact information:   [address] [place] [address] [phone]       Total Time spent on discharge equals 45 minutes.  SignedJeoffrey Massed 09/08/2012 4:20 PM

## 2012-09-08 NOTE — Progress Notes (Signed)
  Subjective: Says that she doesn't feel well this am, but no abdominal pain or fevers  Objective: Vital signs in last 24 hours: Temp:  [97.6 F (36.4 C)-98.5 F (36.9 C)] 97.6 F (36.4 C) (03/17 0456) Pulse Rate:  [53-66] 53 (03/17 0456) Resp:  [18-20] 18 (03/17 0456) BP: (111-138)/(47-59) 122/47 mmHg (03/17 0456) SpO2:  [98 %-100 %] 98 % (03/17 0456) Last BM Date: 2012/09/20  Intake/Output from previous day: September 21, 2022 0701 - 03/17 0700 In: 2420 [P.O.:560; I.V.:1710; IV Piggyback:150] Out: -  Intake/Output this shift:    General appearance: alert, cooperative and no distress GI: soft, NT to exam, ND, no peritoneal signs  Lab Results:   Recent Labs  09/06/12 0859 09-20-2012 0526  WBC 12.0* 6.6  HGB 12.6 10.6*  HCT 36.6 31.2*  PLT 394 328   BMET  Recent Labs  09/06/12 0859 September 20, 2012 0526  NA 135 138  K 4.1 4.1  CL 98 105  CO2 27 25  GLUCOSE 133* 96  BUN 6 7  CREATININE 0.67 0.78  CALCIUM 9.7 8.6   PT/INR No results found for this basename: LABPROT, INR,  in the last 72 hours ABG No results found for this basename: PHART, PCO2, PO2, HCO3,  in the last 72 hours  Studies/Results: Dg Abd 2 Views  09/20/12  *RADIOLOGY REPORT*  Clinical Data: Diarrhea and vomiting.  Small bowel obstruction.  ABDOMEN - 2 VIEW  Comparison: 03/15  Findings: Bowel gas pattern is within normal limits.  No dilated loops of small or large bowel.  No free air.  No worrisome calcifications or acute bony findings.  Previous lumbar decompression and fusion again noted.  IMPRESSION: Gas pattern within normal limits today.   Original Report Authenticated By: Paulina Fusi, M.D.     Anti-infectives: Anti-infectives   Start     Dose/Rate Route Frequency Ordered Stop   Sep 20, 2012 1200  fluconazole (DIFLUCAN) tablet 150 mg     150 mg Oral  Once 09-20-12 1106 09/20/12 1329   09/04/12 1100  piperacillin-tazobactam (ZOSYN) IVPB 3.375 g     3.375 g 12.5 mL/hr over 240 Minutes Intravenous 3 times per  day 09/04/12 0937     09/03/12 1000  ciprofloxacin (CIPRO) IVPB 400 mg  Status:  Discontinued     400 mg 200 mL/hr over 60 Minutes Intravenous Every 12 hours 09/03/12 0824 09/04/12 0937   09/03/12 1000  metroNIDAZOLE (FLAGYL) IVPB 500 mg  Status:  Discontinued     500 mg 100 mL/hr over 60 Minutes Intravenous Every 8 hours 09/03/12 0824 09/04/12 0937      Assessment/Plan: s/p * No surgery found * she has no abdominal pain or fevers.  would go slow with advancing her diet.  eventually transition to oral abx.  LOS: 6 days    Lodema Pilot DAVID 09/08/2012

## 2012-09-08 NOTE — Progress Notes (Signed)
PATIENT DETAILS Name: Debra White Age: 79 y.o. Sex: female Date of Birth: 02-09-1934 Admit Date: 09/02/2012 Admitting Physician Rhetta Mura, MD QIH:[id number],GLOVFIE DAVID, MD  Subjective: No abdominal pain. No vomiting. Diarrhea better. Her main complaint is gas-this is a chronic issue. Tolerating full liquids  Assessment/Plan: Active Problems: Acute Diverticulitis (4th episode) with 1.4 cm abscess.  -Appreciate general surgery and IR consultations  -IR consulted on 3/12-but unable to place perc drain  -was on Cipro/Flagyl since admission-but on 3/13 claimed that Cipro always causes diarrhea-therefore switched to Zosyn on 3/13 -belly is non tender - Plan on discharge later today on Levaquin and Flagyl-outpatient follow up with general surgery  Diarrhea -apparently is a chronic issue for the patient-but had a worse bout lon 3/15 - C Diff PCR-neg - c/w Florastor -this is a chronic issues-has had extensive w/u by Dr Jarold Motto in the past including colonoscopy with bx  ?Ileus -resolved - Tolerating clear and full liquids  Hyponatremia -resolved-Na was 126 on admission-likely 2/2 to dehydration  HTN Controlled with Atenolol   Anxiety  On Xanax TID  Severe malnutrition in the context of chronic illness/Underweight  -continue with supplements  GERD -PPI -stable  Disposition: Remain inpatient- hopefully home later today  DVT Prophylaxis: Prophylactic Lovenox   Code Status: Full code   Procedures:  None  CONSULTS:  general surgery  PHYSICAL EXAM: Vital signs in last 24 hours: Filed Vitals:   09/07/12 0559 09/07/12 1400 09/07/12 2056 09/08/12 0456  BP: 128/44 111/55 138/59 122/47  Pulse: 66 66 62 53  Temp: 98.8 F (37.1 C) 98.1 F (36.7 C) 98.5 F (36.9 C) 97.6 F (36.4 C)  TempSrc: Oral  Oral Oral  Resp: 16 20 18 18   Height:      Weight:      SpO2: 98% 100% 99% 98%    Weight change:  Body mass index is 16.52 kg/(m^2).   Gen Exam:  Awake and alert with clear speech.  Anxious appearing Neck: Supple, No JVD.   Chest: B/L Clear.   CVS: S1 S2 Regular, no murmurs.  Abdomen: soft, BS +, non tender, non distended.  Extremities: no edema, lower extremities warm to touch. Neurologic: Non Focal.   Skin: No Rash.   Wounds: N/A.    Intake/Output from previous day:  Intake/Output Summary (Last 24 hours) at 09/08/12 1125 Last data filed at 09/07/12 2148  Gross per 24 hour  Intake   2420 ml  Output      0 ml  Net   2420 ml     LAB RESULTS: CBC  Recent Labs Lab 09/02/12 1205 09/03/12 0622 09/04/12 0510 09/05/12 0545 09/06/12 0859 09/07/12 0526  WBC 10.3 6.3 6.4 6.3 12.0* 6.6  HGB 10.9* 10.5* 10.5* 10.7* 12.6 10.6*  HCT 30.8* 30.1* 29.8* 30.8* 36.6 31.2*  PLT 259 255 265 313 394 328  MCV 90.3 93.2 92.5 91.7 93.4 94.8  MCH 32.0 32.5 32.6 31.8 32.1 32.2  MCHC 35.4 34.9 35.2 34.7 34.4 34.0  RDW 12.3 12.2 12.1 12.4 12.3 12.7  LYMPHSABS 1.3  --   --   --   --   --   MONOABS 1.4*  --   --   --   --   --   EOSABS 0.1  --   --   --   --   --   BASOSABS 0.0  --   --   --   --   --     Chemistries   Recent  Labs Lab 09/03/12 0622 09/04/12 0510 09/05/12 0545 09/06/12 0859 09/07/12 0526  NA 132* 135 136 135 138  K 4.1 3.7 4.0 4.1 4.1  CL 96 99 99 98 105  CO2 28 28 31 27 25   GLUCOSE 99 109* 99 133* 96  BUN 7 5* 5* 6 7  CREATININE 0.78 0.73 0.78 0.67 0.78  CALCIUM 8.8 8.8 9.0 9.7 8.6    CBG:  Recent Labs Lab 09/07/12 1030 09/07/12 1640 09/07/12 2137 09/08/12 0650 09/08/12 1119  GLUCAP 93 68* 85 77 99    GFR Estimated Creatinine Clearance: 41.3 ml/min (by C-G formula based on Cr of 0.78).  Coagulation profile  Recent Labs Lab 09/03/12 0940  INR 1.13    Cardiac Enzymes No results found for this basename: CK, CKMB, TROPONINI, MYOGLOBIN,  in the last 168 hours  No components found with this basename: POCBNP,  No results found for this basename: DDIMER,  in the last 72 hours No results  found for this basename: HGBA1C,  in the last 72 hours No results found for this basename: CHOL, HDL, LDLCALC, TRIG, CHOLHDL, LDLDIRECT,  in the last 72 hours No results found for this basename: TSH, T4TOTAL, FREET3, T3FREE, THYROIDAB,  in the last 72 hours No results found for this basename: VITAMINB12, FOLATE, FERRITIN, TIBC, IRON, RETICCTPCT,  in the last 72 hours No results found for this basename: LIPASE, AMYLASE,  in the last 72 hours  Urine Studies No results found for this basename: UACOL, UAPR, USPG, UPH, UTP, UGL, UKET, UBIL, UHGB, UNIT, UROB, ULEU, UEPI, UWBC, URBC, UBAC, CAST, CRYS, UCOM, BILUA,  in the last 72 hours  MICROBIOLOGY: Recent Results (from the past 240 hour(s))  CLOSTRIDIUM DIFFICILE BY PCR     Status: None   Collection Time    09/04/12  3:34 PM      Result Value Range Status   C difficile by pcr NEGATIVE  NEGATIVE Final  STOOL CULTURE     Status: None   Collection Time    09/04/12  3:34 PM      Result Value Range Status   Specimen Description STOOL   Final   Special Requests NONE   Final   Culture     Final   Value: NO SUSPICIOUS COLONIES, CONTINUING TO HOLD     Note: REDUCED NORMAL FLORA PRESENT   Report Status PENDING   Incomplete    RADIOLOGY STUDIES/RESULTS: Ct Pelvis Wo Contrast  09/03/2012  *RADIOLOGY REPORT*  Clinical Data: Known diverticular abscess within the left lower abdominal quadrant - please attempt CT guided percutaneous drainage  CT PELVIS WITHOUT CONTRAST  Technique:  Multidetector CT imaging of the pelvis was performed following the standard protocol without intravenous contrast.  Comparison: CT abdomen pelvis - 09/02/2012  Findings:  Limited noncontrast CT imaging of the lower pelvis demonstrates grossly unchanged appearance of note approximately 3.3 x 1.7 cm air and fluid containing diverticular abscess within the caudal aspect of the left hemi pelvis (image 12, series 2).  With the patient initially positioned supine an adequate  percutaneous window was not able to be achieved even with placement of a Foley catheter to decompressthe urinary bladder.  As such, the patient was positioned prone on the CT gantry however repeat CT imaging failed to delineate and adequate percutaneous window for a drainage catheter placement.  As such, no drainage catheter was placed.  Note, previously ingested enteric contrast extends to the level of the rectum.  There is no discrete evidence of leakage  of ingested enteric contrast into the left lower quadrant diverticular abscess.  IMPRESSION: Grossly unchanged approximately 3.3 cm air and fluid containing diverticular abscess within the left hemi abdomen not amendable to percutaneous drainage secondary to lack of adequate percutaneous window.  No drainage catheter placed.  Above findings discussed with Barnetta Chapel, surgical PA, at the time of procedure completion.   Original Report Authenticated By: Tacey Ruiz, MD    Mr Lumbar Spine Wo Contrast  08/15/2012  *RADIOLOGY REPORT*  Clinical Data: Spinal stenosis.  Weakness.  Back pain.  Recent fall.  MRI LUMBAR SPINE WITHOUT CONTRAST  Technique:  Multiplanar and multiecho pulse sequences of the lumbar spine were obtained without intravenous contrast.  Comparison: 12/30/2011  Findings: There is no evidence of acute fracture in the region from lower T10-S3.  There is an old minor superior endplate deformity at L2 which is completely healed.  There are minimal non compressive disc bulges at T12-L1, L1-2 and L2-3.  There is posterior fusion from L3 to the sacrum.  There is anterior fusion at L4-5.  There is been previous posterior decompression in the lower lumbar region and in the spinal canal is widely patent.  IMPRESSION: Previous decompression and fusion from L3 to the sacrum.  Wide patency of the canal and foramina in that region.  Minimal, non compressive disc bulges at T12-L1, L1-2 and L2-3.  No acute fracture.  Old minimal superior endplate deformity at  L2.   Original Report Authenticated By: Paulina Fusi, M.D.    Ct Abdomen Pelvis W Contrast  09/02/2012  *RADIOLOGY REPORT*  Clinical Data: Lower abdominal pain.  Diverticulitis.  CT ABDOMEN AND PELVIS WITH CONTRAST  Technique:  Multidetector CT imaging of the abdomen and pelvis was performed following the standard protocol during bolus administration of intravenous contrast.  Contrast: 75mL OMNIPAQUE IOHEXOL 300 MG/ML  SOLN  Comparison: 12/21/2011  Findings: Increased bibasilar atelectasis.  Liver, gallbladder, spleen, kidneys, pancreas are stable.  Wall thickening of the sigmoid colon and surrounding fatty stranding has increased.  There is a 3.1 x 1.7 cm air and fluid- filled abscess just inferior to the sigmoid colon, deep in the left hemi pelvis, to the left of the bladder.  See image 69.  A few air bubbles anterior to the main abscess may represent a few locules of extraluminal bowel gas in an adjacent tiny abscess.  There is otherwise no evidence of extraluminal bowel gas.  There is no second abscess.  Trace free fluid in the right posterior hemi pelvis.  Stable bladder.  Uterus absent.  Stable lumbar spine with postoperative changes and fusion.  Stable atherosclerotic changes involving the aorta and visceral vasculature.  Stable retroaortic left renal vein.  IMPRESSION: Interval development of acute diverticulitis and a small 3.1 cm abscess. Given the location, drainage of this abscess would be extremely difficult percutaneously.   Original Report Authenticated By: Jolaine Click, M.D.     MEDICATIONS: Scheduled Meds: . atenolol  100 mg Oral Daily  . enoxaparin (LOVENOX) injection  30 mg Subcutaneous Daily  . feeding supplement  237 mL Oral TID WC  . pantoprazole (PROTONIX) IV  40 mg Intravenous Q1200  . piperacillin-tazobactam (ZOSYN)  IV  3.375 g Intravenous Q8H  . psyllium  1 packet Oral Q1200  . saccharomyces boulardii  250 mg Oral BID  . sodium chloride  3 mL Intravenous Q12H   Continuous  Infusions: . dextrose 5 % and 0.9% NaCl 75 mL/hr at 09/06/12 1322   PRN Meds:.acetaminophen, ALPRAZolam,  artificial tears, hydrALAZINE, hyoscyamine, ketorolac, morphine injection, ondansetron, polyvinyl alcohol, simethicone  Antibiotics: Anti-infectives   Start     Dose/Rate Route Frequency Ordered Stop   09/07/12 1200  fluconazole (DIFLUCAN) tablet 150 mg     150 mg Oral  Once 09/07/12 1106 09/07/12 1329   09/04/12 1100  piperacillin-tazobactam (ZOSYN) IVPB 3.375 g     3.375 g 12.5 mL/hr over 240 Minutes Intravenous 3 times per day 09/04/12 0937     09/03/12 1000  ciprofloxacin (CIPRO) IVPB 400 mg  Status:  Discontinued     400 mg 200 mL/hr over 60 Minutes Intravenous Every 12 hours 09/03/12 0824 09/04/12 0937   09/03/12 1000  metroNIDAZOLE (FLAGYL) IVPB 500 mg  Status:  Discontinued     500 mg 100 mL/hr over 60 Minutes Intravenous Every 8 hours 09/03/12 0824 09/04/12 0937       Jeoffrey Massed, MD  [facility] Pager:[phone]  If 7PM-7AM, please contact night-coverage [email or web address] Password [email or web address] 09/08/2012, 11:25 AM   LOS: 6 days

## 2012-10-01 ENCOUNTER — Encounter (INDEPENDENT_AMBULATORY_CARE_PROVIDER_SITE_OTHER): Payer: Medicare Other | Admitting: General Surgery

## 2012-10-02 ENCOUNTER — Ambulatory Visit (INDEPENDENT_AMBULATORY_CARE_PROVIDER_SITE_OTHER): Payer: Medicare Other | Admitting: General Surgery

## 2012-10-02 ENCOUNTER — Encounter (INDEPENDENT_AMBULATORY_CARE_PROVIDER_SITE_OTHER): Payer: Self-pay | Admitting: General Surgery

## 2012-10-02 ENCOUNTER — Encounter (INDEPENDENT_AMBULATORY_CARE_PROVIDER_SITE_OTHER): Payer: Self-pay

## 2012-10-02 VITALS — BP 128/78 | HR 70 | Resp 18 | Ht 64.0 in | Wt 96.0 lb

## 2012-10-02 DIAGNOSIS — K5732 Diverticulitis of large intestine without perforation or abscess without bleeding: Secondary | ICD-10-CM

## 2012-10-02 NOTE — Progress Notes (Signed)
Patient ID: Debra White, female   DOB: [birth year], 79 y.o.   MRN: [id number] The patient is a 79 year old female who is a followup from hospital consultation of diverticulitis with a small abscess. The patient has been doing well since been released from the hospital. She's follow up with Dr. Lynford Humphrey was placed on additional antibiotics. She states she is not having abdominal pain or any fevers while at home. She does say she has some bouts of diarrhea which I think could be related to her antibiotics.  She'll follow up with Dr. Lynford Humphrey on the 14th.  On exam: Her abdomen is soft, nontender, nondistended, with active bowel sounds. There is no rebound or guarding. She has normal active bowel sounds.  Assessment and plan 79 year old female with resolved episode of acute diverticulitis. I will let Dr. Lynford Humphrey manage her antibiotics at this point. I do not see any reason that she would need an extended period of antibiotics.  The patient recently had a colonoscopy 3 months prior to this episode in which no masses were seen. Will have patient follow up when necessary

## 2013-03-25 ENCOUNTER — Emergency Department (HOSPITAL_COMMUNITY): Payer: Medicare Other

## 2013-03-25 ENCOUNTER — Emergency Department (HOSPITAL_COMMUNITY)
Admission: EM | Admit: 2013-03-25 | Discharge: 2013-03-25 | Disposition: A | Discharge status: Home / Self Care | Payer: Medicare Other | Attending: Emergency Medicine | Admitting: Emergency Medicine

## 2013-03-25 ENCOUNTER — Encounter (HOSPITAL_COMMUNITY): Payer: Self-pay | Admitting: *Deleted

## 2013-03-25 DIAGNOSIS — R51 Headache: Secondary | ICD-10-CM | POA: Insufficient documentation

## 2013-03-25 DIAGNOSIS — N189 Chronic kidney disease, unspecified: Secondary | ICD-10-CM | POA: Insufficient documentation

## 2013-03-25 DIAGNOSIS — Z87442 Personal history of urinary calculi: Secondary | ICD-10-CM | POA: Insufficient documentation

## 2013-03-25 DIAGNOSIS — F411 Generalized anxiety disorder: Secondary | ICD-10-CM | POA: Insufficient documentation

## 2013-03-25 DIAGNOSIS — M129 Arthropathy, unspecified: Secondary | ICD-10-CM | POA: Insufficient documentation

## 2013-03-25 DIAGNOSIS — Z880 Allergy status to penicillin: Secondary | ICD-10-CM | POA: Insufficient documentation

## 2013-03-25 DIAGNOSIS — Z91040 Latex allergy status: Secondary | ICD-10-CM | POA: Insufficient documentation

## 2013-03-25 DIAGNOSIS — K219 Gastro-esophageal reflux disease without esophagitis: Secondary | ICD-10-CM | POA: Insufficient documentation

## 2013-03-25 DIAGNOSIS — E785 Hyperlipidemia, unspecified: Secondary | ICD-10-CM | POA: Insufficient documentation

## 2013-03-25 DIAGNOSIS — J438 Other emphysema: Secondary | ICD-10-CM | POA: Insufficient documentation

## 2013-03-25 DIAGNOSIS — H538 Other visual disturbances: Secondary | ICD-10-CM | POA: Insufficient documentation

## 2013-03-25 DIAGNOSIS — H269 Unspecified cataract: Secondary | ICD-10-CM | POA: Insufficient documentation

## 2013-03-25 DIAGNOSIS — R110 Nausea: Secondary | ICD-10-CM | POA: Insufficient documentation

## 2013-03-25 DIAGNOSIS — I129 Hypertensive chronic kidney disease with stage 1 through stage 4 chronic kidney disease, or unspecified chronic kidney disease: Secondary | ICD-10-CM | POA: Insufficient documentation

## 2013-03-25 DIAGNOSIS — R519 Headache, unspecified: Secondary | ICD-10-CM

## 2013-03-25 DIAGNOSIS — Z79899 Other long term (current) drug therapy: Secondary | ICD-10-CM | POA: Insufficient documentation

## 2013-03-25 DIAGNOSIS — R5381 Other malaise: Secondary | ICD-10-CM | POA: Insufficient documentation

## 2013-03-25 DIAGNOSIS — G8929 Other chronic pain: Secondary | ICD-10-CM | POA: Insufficient documentation

## 2013-03-25 DIAGNOSIS — IMO0001 Reserved for inherently not codable concepts without codable children: Secondary | ICD-10-CM | POA: Insufficient documentation

## 2013-03-25 DIAGNOSIS — R52 Pain, unspecified: Secondary | ICD-10-CM | POA: Insufficient documentation

## 2013-03-25 DIAGNOSIS — E871 Hypo-osmolality and hyponatremia: Secondary | ICD-10-CM

## 2013-03-25 LAB — CBC WITH DIFFERENTIAL/PLATELET
Basophils Relative: 0 % (ref 0–1)
Eosinophils Absolute: 0.1 10*3/uL (ref 0.0–0.7)
HCT: 36.6 % (ref 36.0–46.0)
Hemoglobin: 13.3 g/dL (ref 12.0–15.0)
MCH: 32.5 pg (ref 26.0–34.0)
MCHC: 36.3 g/dL — ABNORMAL HIGH (ref 30.0–36.0)
Monocytes Absolute: 0.9 10*3/uL (ref 0.1–1.0)
Monocytes Relative: 9 % (ref 3–12)
Neutrophils Relative %: 67 % (ref 43–77)

## 2013-03-25 LAB — COMPREHENSIVE METABOLIC PANEL
Albumin: 4.5 g/dL (ref 3.5–5.2)
BUN: 17 mg/dL (ref 6–23)
Calcium: 9.3 mg/dL (ref 8.4–10.5)
Creatinine, Ser: 0.62 mg/dL (ref 0.50–1.10)
Total Protein: 8.1 g/dL (ref 6.0–8.3)

## 2013-03-25 LAB — BASIC METABOLIC PANEL
BUN: 13 mg/dL (ref 6–23)
CO2: 25 mEq/L (ref 19–32)
Chloride: 96 mEq/L (ref 96–112)
Creatinine, Ser: 0.58 mg/dL (ref 0.50–1.10)
GFR calc Af Amer: >90 mL/min (ref >90)
Glucose, Bld: 94 mg/dL (ref 70–99)
Potassium: 4.1 mEq/L (ref 3.5–5.1)
Sodium: 132 mEq/L — ABNORMAL LOW (ref 135–145)

## 2013-03-25 LAB — PROTIME-INR: Prothrombin Time: 12 seconds (ref 11.6–15.2)

## 2013-03-25 MED ORDER — METOCLOPRAMIDE HCL 5 MG/ML IJ SOLN: 10.0000 mg | Freq: Once | INTRAMUSCULAR | Status: DC

## 2013-03-25 MED ORDER — SODIUM CHLORIDE 0.9 % IV BOLUS (SEPSIS)
1000.0000 mL | Freq: Once | INTRAVENOUS | Status: AC
  Administered 2013-03-25: 1000 mL via INTRAVENOUS

## 2013-03-25 MED ORDER — DIPHENHYDRAMINE HCL 50 MG/ML IJ SOLN: 12.5000 mg | Freq: Once | INTRAMUSCULAR | Status: DC

## 2013-03-25 MED ORDER — TETRACAINE HCL 0.5 % OP SOLN
2.0000 [drp] | Freq: Once | OPHTHALMIC | Status: AC
  Administered 2013-03-25: 2 [drp] via OPHTHALMIC
  Filled 2013-03-25: qty 2

## 2013-03-25 MED ORDER — MORPHINE SULFATE 2 MG/ML IJ SOLN
2.0000 mg | Freq: Once | INTRAMUSCULAR | Status: AC
  Administered 2013-03-25: 2 mg via INTRAVENOUS
  Filled 2013-03-25: qty 1

## 2013-03-25 MED ORDER — GADOBENATE DIMEGLUMINE 529 MG/ML IV SOLN
9.0000 mL | Freq: Once | INTRAVENOUS | Status: AC
  Administered 2013-03-25: 9 mL via INTRAVENOUS

## 2013-03-25 MED ORDER — HYDROCODONE-ACETAMINOPHEN 5-325 MG PO TABS
2.0000 | ORAL_TABLET | Freq: Once | ORAL | Status: AC
  Administered 2013-03-25: 2 via ORAL
  Filled 2013-03-25: qty 2

## 2013-03-25 MED ORDER — ALPRAZOLAM 0.25 MG PO TABS
1.0000 mg | ORAL_TABLET | Freq: Once | ORAL | Status: AC
  Administered 2013-03-25: 1 mg via ORAL
  Filled 2013-03-25: qty 4

## 2013-03-25 MED ORDER — HYDROCODONE-ACETAMINOPHEN 5-325 MG PO TABS: 2.0000 | ORAL_TABLET | ORAL | Status: DC | PRN

## 2013-03-25 NOTE — ED Notes (Signed)
Pt ambulated approx 15 feet.  Pt shaky on her feet and stated, "it feels like my legs don't wanna work."

## 2013-03-25 NOTE — ED Notes (Signed)
PT reports HA started on Monday. Pt also has blurred vision. Pain is a 7/10

## 2013-03-25 NOTE — ED Notes (Signed)
Checked patient eyes with glasses 20/40 in both eyes. 20/40 right eye.20/40 left eye

## 2013-03-25 NOTE — ED Notes (Signed)
Patient transported to MRI 

## 2013-03-25 NOTE — ED Provider Notes (Signed)
CSN: [id number]     Arrival date & time 03/25/13  1650 History   First MD Initiated Contact with Patient 03/25/13 1731     Chief Complaint  Patient presents with  . Headache   (Consider location/radiation/quality/duration/timing/severity/associated sxs/prior Treatment) HPI Comments: Patient reports diffuse headache that onset 4 days ago. It has gradually gotten worse. She has a history of facial fracture that was repaired surgically by one year ago. She states she's had headaches ever since then became more severe in the past several days. She denies thunderclap onset and states it has gradually progressed. She denies any nausea, vomiting. She denies any eye pain. She endorses bilateral blurred vision photophobia or phonophobia. No fever, weakness, numbness or tingling. Is not taking anticoagulants. No chest pain or shortness of breath.  The history is provided by the patient.    Past Medical History  Diagnosis Date  . Hypertension   . Hyperlipemia   . Fibromyalgia   . Anxiety   . Dysrhythmia     RX  . Blood transfusion   . Chronic kidney disease     STONES  . GERD (gastroesophageal reflux disease)   . Headache(784.0)   . Arthritis   . Cataract   . Diverticulosis of colon (without mention of hemorrhage) 2007    Colonoscopy  . Candida esophagitis 2013    EGD  . Family history of colon cancer     sister  . Emphysema of lung    Past Surgical History  Procedure Laterality Date  . Abdominal hysterectomy    . Hand surgery      BIL   . Shoulder arthroscopy w/ rotator cuff repair      LFT  . Cervical disc surgery    . Back surgery      X2  . Orif tripod fracture  07/13/2011    Procedure: OPEN REDUCTION INTERNAL FIXATION (ORIF) TRIPOD FRACTURE;  Surgeon: [name], MD;  Location: [facility] OR;  Service: ENT;  Laterality: Right;  ORIF RIGHT ZYGOMA, ORBITAL FLOOR EXPLORATION WITH FROST STITCH (TEMPORARY TARSORRHAPHY)  . Colonoscopy    . Facial fracture surgery    . Back surgery      Family History  Problem Relation Age of Onset  . Heart disease Mother   . Breast cancer Sister   . Colon cancer Sister   . Cancer Sister     breast  . Cancer Sister     colon  . Cancer Sister     colon   History  Substance Use Topics  . Smoking status: Never Smoker   . Smokeless tobacco: Never Used  . Alcohol Use: No   OB History   Grav Para Term Preterm Abortions TAB SAB Ect Mult Living                 Review of Systems  Constitutional: Positive for activity change. Negative for fever and fatigue.  HENT: Negative for congestion and rhinorrhea.   Eyes: Positive for visual disturbance. Negative for photophobia and itching.  Respiratory: Negative for cough, chest tightness and shortness of breath.   Cardiovascular: Negative for chest pain.  Gastrointestinal: Positive for nausea. Negative for vomiting and abdominal pain.  Genitourinary: Negative for dysuria, hematuria, vaginal bleeding and vaginal discharge.  Musculoskeletal: Negative for back pain.  Skin: Negative for rash.  Neurological: Positive for weakness and headaches. Negative for dizziness, facial asymmetry, speech difficulty and numbness.  A complete 10 system review of systems was obtained and all systems are negative  except as noted in the HPI and PMH.    Allergies  Latex; Amoxicillin; Chlorhexidine gluconate; Aspirin; Codeine; Prochlorperazine; Betadine; and Caffeine  Home Medications   Current Outpatient Rx  Name  Route  Sig  Dispense  Refill  . ALPRAZolam (XANAX) 1 MG tablet   Oral   Take 1 mg by mouth 3 (three) times daily as needed. For anxiety         . atenolol (TENORMIN) 100 MG tablet   Oral   Take 100 mg by mouth every morning.          Marland Kitchen azithromycin (ZITHROMAX) 250 MG tablet   Oral   Take 250-500 mg by mouth daily. 500mg  on day 1 and then 1 tablet for 4 days therafter         . cholecalciferol (VITAMIN D) 1000 UNITS tablet   Oral   Take 2,000 Units by mouth daily.         .  hyoscyamine (LEVSIN SL) 0.125 MG SL tablet   Sublingual   Place 0.125 mg under the tongue every 4 (four) hours as needed for cramping (nausea).         . losartan (COZAAR) 100 MG tablet   Oral   Take 50 mg by mouth 2 (two) times daily.          . Multiple Vitamins-Minerals (MULTIVITAMINS THER. W/MINERALS) TABS   Oral   Take 1 tablet by mouth daily.         . pantoprazole (PROTONIX) 40 MG tablet   Oral   Take 40 mg by mouth daily.         . pravastatin (PRAVACHOL) 40 MG tablet   Oral   Take 40 mg by mouth daily.         . sertraline (ZOLOFT) 50 MG tablet   Oral   Take 25 mg by mouth every morning.         Marland Kitchen HYDROcodone-acetaminophen (NORCO/VICODIN) 5-325 MG per tablet   Oral   Take 2 tablets by mouth every 4 (four) hours as needed for pain.   10 tablet   0    BP 154/100  Pulse 60  Temp(Src) 98 F (36.7 C) (Oral)  Resp 18  Ht 5\' 4"  (1.626 m)  Wt 105 lb (47.628 kg)  BMI 18.01 kg/m2  SpO2 100% Physical Exam  Constitutional: She is oriented to person, place, and time. She appears well-developed and well-nourished. No distress.  HENT:  Head: Normocephalic and atraumatic.  Mouth/Throat: Oropharynx is clear and moist. No oropharyngeal exudate.  No temporal artery tenderness  Eyes: Conjunctivae and EOM are normal. Pupils are equal, round, and reactive to light.  IOP OD 14, IOP OS 16  Neck: Normal range of motion. Neck supple.  No meningismus  Cardiovascular: Normal rate, regular rhythm and normal heart sounds.   Pulmonary/Chest: Effort normal and breath sounds normal. No respiratory distress.  Abdominal: Soft. There is no tenderness. There is no rebound and no guarding.  Musculoskeletal: Normal range of motion. She exhibits no edema and no tenderness.  Neurological: She is alert and oriented to person, place, and time. No cranial nerve deficit. She exhibits normal muscle tone. Coordination normal.  CN 2-12 intact, no ataxia on finger to nose, no nystagmus,  5/5 strength throughout, no pronator drift, Romberg negative, normal gait.   Skin: Skin is warm.    ED Course  Procedures (including critical care time) Labs Review Labs Reviewed  CBC WITH DIFFERENTIAL - Abnormal; Notable for the  following:    MCHC 36.3 (*)    All other components within normal limits  COMPREHENSIVE METABOLIC PANEL - Abnormal; Notable for the following:    Sodium 126 (*)    Chloride 87 (*)    Glucose, Bld 104 (*)    GFR calc non Af Amer 85 (*)    All other components within normal limits  BASIC METABOLIC PANEL - Abnormal; Notable for the following:    Sodium 132 (*)    GFR calc non Af Amer 87 (*)    All other components within normal limits  SEDIMENTATION RATE  PROTIME-INR   Imaging Review Ct Head Wo Contrast  03/25/2013   CLINICAL DATA:  Headache for 4 days  EXAM: CT HEAD WITHOUT CONTRAST  TECHNIQUE: Contiguous axial images were obtained from the base of the skull through the vertex without intravenous contrast. Study was obtained within 24 hr of patient's arrival at the emergency department.  COMPARISON:  December 21, 2011  FINDINGS: Ventricles are normal in size and configuration. There is no mass, hemorrhage, extra-axial fluid collection, or midline shift. Mild small vessel disease in the centra semiovale bilaterally is stable. No new gray-white compartment lesions are identified. There is no demonstrable acute infarct.  Bony calvarium appears intact. Mastoid air cells are clear. Screw and plate fixation in the superior right orbital region is stable.  IMPRESSION: The mild small vessel disease. No intracranial mass, hemorrhage, or acute infarct. Postoperative change in the superior right orbital region is stable.   Electronically Signed   By: Bretta Bang   On: 03/25/2013 18:30   Mr Maxine Glenn Head Wo Contrast  03/25/2013   *RADIOLOGY REPORT*  Clinical Data: Headache, blurred vision, history of facial fractures  MRI HEAD WITHOUT AND WITH CONTRAST MRA HEAD WITHOUT CONTRAST  MRA NECK WITHOUT AND WITH CONTRAST  Technique:  Multiplanar, multiecho pulse sequences of the brain and surrounding structures were obtained without and with intravenous contrast.  Angiographic images of the Circle of Willis were obtained using MRA technique without intravenous contrast. Angiographic images of the neck were obtained using MRA technique without and with intravenous contrast.  Carotid stenosis measurements (when applicable) are obtained utilizing NASCET criteria, using the distal internal carotid diameter as the denominator.  Contrast: 9mL MULTIHANCE GADOBENATE DIMEGLUMINE 529 MG/ML IV SOLN  Comparison:   See performed earlier on the same day as well as prior MRI from 09/01/2011.  MRI HEAD  Findings: A few scattered foci of hyperintense T2 / FLAIR signal are seen within the periventricular and deep white matter, most compatible with chronic small vessel ischemic changes.  A small remote lacunar infarct is noted within the subcortical white matter of the right parietal lobe (series 8, image 17).  No diffusion weighted signal abnormality to suggest acute intracranial infarct is identified.  Normal flow voids are seen within the intracranial vasculature.  There is no mass lesion or midline shift.  No acute intracranial hemorrhage.  The midline structures are intact and normal in appearance.  Pituitary gland is normal.  Pituitary stalk is midline. Overall brain volume is within normal limits for patient age without significant atrophy. CSF containing spaces are normal. No hydrocephalous.  Postoperative changes are again seen within the right supraorbital region from prior facial fractures.  The orbits are within normal limits.  Optic nerves and optic chiasm demonstrate normal appearance of signal intensity.  The paranasal sinuses are clear.  No mastoid effusion.  Calvarium demonstrates a normal appearance of signal intensity.  Scalp soft tissues  are within normal limits.  No abnormal enhancement is seen  on post contrast sequences.  IMPRESSION: 1.  No acute intracranial hemorrhage or infarct.  No mass lesion or abnormal enhancement identified. 2.  Small remote lacunar infarct within the subcortical white matter of the right parietal lobe. 3.  Mild chronic small vessel ischemic changes.  MRA HEAD  Findings: The visualized portions of the internal carotid arteries including the petrous and cavernous segments are of symmetric caliber with normal antegrade flow.  No flow limiting stenosis identified.  The A1 segments, anterior communicating artery, and anterior cerebral artery is are normal caliber.  No aneurysm identified within the anterior circulation.  Mild multifocal irregularity is noted within the M1 segments of the middle cerebral arteries bilaterally without flow limiting stenosis.  The distal branches of the middle cerebral arteries are within normal limits bilaterally.  No aneurysm identified at the density bifurcations.  The vertebral arteries are symmetric in size and appearance with patent antegrade flow.  Vertebral basilar junction and basilar artery are within normal limits without evidence of stenosis or aneurysm.  The posterior inferior cerebellar arteries, anterior inferior cerebellar arteries, superior cerebellar arteries, and posterior cerebral arteries are within normal limits.  The posterior communicating arteries are patent.  IMPRESSION: Normal MRA of the brain without evidence of intracranial aneurysm or high-grade flow limiting stenosis.  MRA NECK  Findings: The visualized portions of the aortic arch are normal in appearance with normal three-vessel morphology.  The origin of the left brachial cephalic artery, left common carotid artery, and left subclavian artery are grossly normal without high-grade stenosis. The origin of the vertebral arteries are widely patent without evidence of stenosis.  The common carotid arteries are normal caliber and symmetric in size and appearance.  No high-grade  stenosis.  Carotid bifurcations are within normal limits.  The internal carotid arteries are of symmetric caliber without flow limiting high-grade stenosis or pseudoaneurysm.  The internal carotid carotid arteries are visualized to the level of the circle Willis.  The external carotid arteries and their branches are within normal limits.  The vertebral arteries are widely patent with normal antegrade flow.  No smooth narrowing to suggest vertebral artery dissection. No flow limiting stenosis.  No aneurysm identified about the vertebral arteries or at the vertebral basilar junction.  IMPRESSION:  Normal MRA of the neck without evidence of flow limiting stenosis or other abnormality.   Original Report Authenticated By: Rise Mu, M.D.   Mr Angiogram Neck W Wo Contrast  03/25/2013   *RADIOLOGY REPORT*  Clinical Data: Headache, blurred vision, history of facial fractures  MRI HEAD WITHOUT AND WITH CONTRAST MRA HEAD WITHOUT CONTRAST MRA NECK WITHOUT AND WITH CONTRAST  Technique:  Multiplanar, multiecho pulse sequences of the brain and surrounding structures were obtained without and with intravenous contrast.  Angiographic images of the Circle of Willis were obtained using MRA technique without intravenous contrast. Angiographic images of the neck were obtained using MRA technique without and with intravenous contrast.  Carotid stenosis measurements (when applicable) are obtained utilizing NASCET criteria, using the distal internal carotid diameter as the denominator.  Contrast: 9mL MULTIHANCE GADOBENATE DIMEGLUMINE 529 MG/ML IV SOLN  Comparison:   See performed earlier on the same day as well as prior MRI from 09/01/2011.  MRI HEAD  Findings: A few scattered foci of hyperintense T2 / FLAIR signal are seen within the periventricular and deep white matter, most compatible with chronic small vessel ischemic changes.  A small remote lacunar infarct is noted  within the subcortical white matter of the right  parietal lobe (series 8, image 17).  No diffusion weighted signal abnormality to suggest acute intracranial infarct is identified.  Normal flow voids are seen within the intracranial vasculature.  There is no mass lesion or midline shift.  No acute intracranial hemorrhage.  The midline structures are intact and normal in appearance.  Pituitary gland is normal.  Pituitary stalk is midline. Overall brain volume is within normal limits for patient age without significant atrophy. CSF containing spaces are normal. No hydrocephalous.  Postoperative changes are again seen within the right supraorbital region from prior facial fractures.  The orbits are within normal limits.  Optic nerves and optic chiasm demonstrate normal appearance of signal intensity.  The paranasal sinuses are clear.  No mastoid effusion.  Calvarium demonstrates a normal appearance of signal intensity.  Scalp soft tissues are within normal limits.  No abnormal enhancement is seen on post contrast sequences.  IMPRESSION: 1.  No acute intracranial hemorrhage or infarct.  No mass lesion or abnormal enhancement identified. 2.  Small remote lacunar infarct within the subcortical white matter of the right parietal lobe. 3.  Mild chronic small vessel ischemic changes.  MRA HEAD  Findings: The visualized portions of the internal carotid arteries including the petrous and cavernous segments are of symmetric caliber with normal antegrade flow.  No flow limiting stenosis identified.  The A1 segments, anterior communicating artery, and anterior cerebral artery is are normal caliber.  No aneurysm identified within the anterior circulation.  Mild multifocal irregularity is noted within the M1 segments of the middle cerebral arteries bilaterally without flow limiting stenosis.  The distal branches of the middle cerebral arteries are within normal limits bilaterally.  No aneurysm identified at the density bifurcations.  The vertebral arteries are symmetric in size  and appearance with patent antegrade flow.  Vertebral basilar junction and basilar artery are within normal limits without evidence of stenosis or aneurysm.  The posterior inferior cerebellar arteries, anterior inferior cerebellar arteries, superior cerebellar arteries, and posterior cerebral arteries are within normal limits.  The posterior communicating arteries are patent.  IMPRESSION: Normal MRA of the brain without evidence of intracranial aneurysm or high-grade flow limiting stenosis.  MRA NECK  Findings: The visualized portions of the aortic arch are normal in appearance with normal three-vessel morphology.  The origin of the left brachial cephalic artery, left common carotid artery, and left subclavian artery are grossly normal without high-grade stenosis. The origin of the vertebral arteries are widely patent without evidence of stenosis.  The common carotid arteries are normal caliber and symmetric in size and appearance.  No high-grade stenosis.  Carotid bifurcations are within normal limits.  The internal carotid arteries are of symmetric caliber without flow limiting high-grade stenosis or pseudoaneurysm.  The internal carotid carotid arteries are visualized to the level of the circle Willis.  The external carotid arteries and their branches are within normal limits.  The vertebral arteries are widely patent with normal antegrade flow.  No smooth narrowing to suggest vertebral artery dissection. No flow limiting stenosis.  No aneurysm identified about the vertebral arteries or at the vertebral basilar junction.  IMPRESSION:  Normal MRA of the neck without evidence of flow limiting stenosis or other abnormality.   Original Report Authenticated By: Rise Mu, M.D.   Mr Laqueta Jean Wo Contrast  03/25/2013   *RADIOLOGY REPORT*  Clinical Data: Headache, blurred vision, history of facial fractures  MRI HEAD WITHOUT AND WITH CONTRAST MRA HEAD  WITHOUT CONTRAST MRA NECK WITHOUT AND WITH CONTRAST   Technique:  Multiplanar, multiecho pulse sequences of the brain and surrounding structures were obtained without and with intravenous contrast.  Angiographic images of the Circle of Willis were obtained using MRA technique without intravenous contrast. Angiographic images of the neck were obtained using MRA technique without and with intravenous contrast.  Carotid stenosis measurements (when applicable) are obtained utilizing NASCET criteria, using the distal internal carotid diameter as the denominator.  Contrast: 9mL MULTIHANCE GADOBENATE DIMEGLUMINE 529 MG/ML IV SOLN  Comparison:   See performed earlier on the same day as well as prior MRI from 09/01/2011.  MRI HEAD  Findings: A few scattered foci of hyperintense T2 / FLAIR signal are seen within the periventricular and deep white matter, most compatible with chronic small vessel ischemic changes.  A small remote lacunar infarct is noted within the subcortical white matter of the right parietal lobe (series 8, image 17).  No diffusion weighted signal abnormality to suggest acute intracranial infarct is identified.  Normal flow voids are seen within the intracranial vasculature.  There is no mass lesion or midline shift.  No acute intracranial hemorrhage.  The midline structures are intact and normal in appearance.  Pituitary gland is normal.  Pituitary stalk is midline. Overall brain volume is within normal limits for patient age without significant atrophy. CSF containing spaces are normal. No hydrocephalous.  Postoperative changes are again seen within the right supraorbital region from prior facial fractures.  The orbits are within normal limits.  Optic nerves and optic chiasm demonstrate normal appearance of signal intensity.  The paranasal sinuses are clear.  No mastoid effusion.  Calvarium demonstrates a normal appearance of signal intensity.  Scalp soft tissues are within normal limits.  No abnormal enhancement is seen on post contrast sequences.   IMPRESSION: 1.  No acute intracranial hemorrhage or infarct.  No mass lesion or abnormal enhancement identified. 2.  Small remote lacunar infarct within the subcortical white matter of the right parietal lobe. 3.  Mild chronic small vessel ischemic changes.  MRA HEAD  Findings: The visualized portions of the internal carotid arteries including the petrous and cavernous segments are of symmetric caliber with normal antegrade flow.  No flow limiting stenosis identified.  The A1 segments, anterior communicating artery, and anterior cerebral artery is are normal caliber.  No aneurysm identified within the anterior circulation.  Mild multifocal irregularity is noted within the M1 segments of the middle cerebral arteries bilaterally without flow limiting stenosis.  The distal branches of the middle cerebral arteries are within normal limits bilaterally.  No aneurysm identified at the density bifurcations.  The vertebral arteries are symmetric in size and appearance with patent antegrade flow.  Vertebral basilar junction and basilar artery are within normal limits without evidence of stenosis or aneurysm.  The posterior inferior cerebellar arteries, anterior inferior cerebellar arteries, superior cerebellar arteries, and posterior cerebral arteries are within normal limits.  The posterior communicating arteries are patent.  IMPRESSION: Normal MRA of the brain without evidence of intracranial aneurysm or high-grade flow limiting stenosis.  MRA NECK  Findings: The visualized portions of the aortic arch are normal in appearance with normal three-vessel morphology.  The origin of the left brachial cephalic artery, left common carotid artery, and left subclavian artery are grossly normal without high-grade stenosis. The origin of the vertebral arteries are widely patent without evidence of stenosis.  The common carotid arteries are normal caliber and symmetric in size and appearance.  No high-grade  stenosis.  Carotid  bifurcations are within normal limits.  The internal carotid arteries are of symmetric caliber without flow limiting high-grade stenosis or pseudoaneurysm.  The internal carotid carotid arteries are visualized to the level of the circle Willis.  The external carotid arteries and their branches are within normal limits.  The vertebral arteries are widely patent with normal antegrade flow.  No smooth narrowing to suggest vertebral artery dissection. No flow limiting stenosis.  No aneurysm identified about the vertebral arteries or at the vertebral basilar junction.  IMPRESSION:  Normal MRA of the neck without evidence of flow limiting stenosis or other abnormality.   Original Report Authenticated By: Rise Mu, M.D.    MDM   1. Headache   2. Hyponatremia    Acute on chronic headache that has worsened over the past 4 days associated with blurry vision. No focal weakness, numbness or tingling. No temporal artery tenderness.  Doubt meningitis or temporal arteritis. Headache is gradual onset and atypical of subarachnoid hemorrhage. Neurological exam is nonfocal. Visual acuity 20/40 and IOP normal. ESR normal.  Pain improved in the ED with treatment. Hypovolemic hyponatremia on initial labs. Dehydration may be contributing to headache.  Sodium improved to 132 on recheck. MRI does show any evidence of hemorrhage or stroke. No evidence of vascular dissection or stenosis. Patient is ambulatory and tolerating by mouth.   Glynn Octave, MD 03/26/13 [phone]

## 2013-03-25 NOTE — ED Provider Notes (Signed)
From Dr. Raye Sorrow office.  4 day history of headache.  Facial paresthesias stable from previous facial fractures. No motor deficits.  Glynn Octave, MD 03/25/13 1729

## 2013-03-25 NOTE — ED Notes (Signed)
Family at bedside. 

## 2013-03-26 LAB — SEDIMENTATION RATE: Sed Rate: 9 mm/hr (ref 0–22)

## 2013-05-14 ENCOUNTER — Encounter: Payer: Self-pay | Admitting: Internal Medicine

## 2013-05-14 DIAGNOSIS — I10 Essential (primary) hypertension: Secondary | ICD-10-CM | POA: Insufficient documentation

## 2013-05-14 DIAGNOSIS — E782 Mixed hyperlipidemia: Secondary | ICD-10-CM | POA: Insufficient documentation

## 2013-05-14 DIAGNOSIS — E785 Hyperlipidemia, unspecified: Secondary | ICD-10-CM | POA: Insufficient documentation

## 2013-05-14 DIAGNOSIS — K219 Gastro-esophageal reflux disease without esophagitis: Secondary | ICD-10-CM | POA: Insufficient documentation

## 2013-05-15 ENCOUNTER — Encounter: Payer: Self-pay | Admitting: Emergency Medicine

## 2013-05-15 ENCOUNTER — Ambulatory Visit: Payer: Self-pay | Admitting: Emergency Medicine

## 2013-05-15 VITALS — BP 112/58 | HR 60 | Temp 97.8°F | Resp 16 | Ht 64.5 in | Wt 108.0 lb

## 2013-05-15 DIAGNOSIS — R5381 Other malaise: Secondary | ICD-10-CM

## 2013-05-15 LAB — HEPATIC FUNCTION PANEL
ALT: 28 U/L (ref 0–35)
AST: 23 U/L (ref 0–37)
Bilirubin, Direct: 0.1 mg/dL (ref 0.0–0.3)
Indirect Bilirubin: 0.3 mg/dL (ref 0.0–0.9)
Total Protein: 6.8 g/dL (ref 6.0–8.3)

## 2013-05-15 LAB — CBC WITH DIFFERENTIAL/PLATELET
Basophils Relative: 1 % (ref 0–1)
Eosinophils Absolute: 0.2 10*3/uL (ref 0.0–0.7)
HCT: 36.5 % (ref 36.0–46.0)
Hemoglobin: 12.5 g/dL (ref 12.0–15.0)
Lymphocytes Relative: 29 % (ref 12–46)
Lymphs Abs: 1.9 10*3/uL (ref 0.7–4.0)
MCH: 32.1 pg (ref 26.0–34.0)
MCHC: 34.2 g/dL (ref 30.0–36.0)
MCV: 93.8 fL (ref 78.0–100.0)
Monocytes Absolute: 0.9 10*3/uL (ref 0.1–1.0)
Monocytes Relative: 13 % — ABNORMAL HIGH (ref 3–12)
Neutrophils Relative %: 53 % (ref 43–77)
RBC: 3.89 MIL/uL (ref 3.87–5.11)

## 2013-05-15 LAB — BASIC METABOLIC PANEL WITH GFR
BUN: 17 mg/dL (ref 6–23)
CO2: 34 mEq/L — ABNORMAL HIGH (ref 19–32)
Calcium: 9.2 mg/dL (ref 8.4–10.5)
Chloride: 98 mEq/L (ref 96–112)
Glucose, Bld: 88 mg/dL (ref 70–99)
Potassium: 4.8 mEq/L (ref 3.5–5.3)
Sodium: 136 mEq/L (ref 135–145)

## 2013-05-15 NOTE — Patient Instructions (Signed)
Occipital Neuralgia Neuralgias are attacks of sharp stabbing pain. They may be intermittent (comes and goes) or constant in nature. They may be brief attacks that last seconds to minutes and may come back for days to weeks. The neuralgias can occur as a result of a herpes zoster (shingles), chickenpox infection, or even following a herpes simplex infection (cold sore). TYPES OF NEURALGIA  When these pains are located in the back of the head and neck they are called occipital neuralgias.  When the pain is located between ribs it is called intercostal neuralgia.  When the pain is located in the face it is called trigeminal neuralgia. This is the most common neuralgia. It causes sharp, shock like pain on one side of your face. The neuralgias, which follow herpes zoster infections, often produce a constant burning pain. They may last from weeks to months and even years. The attacks of pain may come from injury or inflammation (irritation) to a nerve. Often the cause is unknown. The episodes of pain may be caused by light touch, movement, or even eating and sneezing. Usually these neuralgias occur after age 46. The neuralgias following shingles and trigeminal neuralgia are the most common. Although painful, these episodes do not threaten life and tend to lessen as we grow older. TREATMENT  There are many medications that may be helpful in the treatment of this disorder. Sometimes several medications may have to be tried before the right combination can be found for you. Some of these medications are:  Only take over-the-counter or prescription medications for pain, discomfort, or fever as directed by your caregiver.  Narcotic medications may be used to control the pain.  Antidepressants and medications used in epilepsy (seizure disorders) may be useful. LET YOUR CAREGIVER KNOW ABOUT:  If you do not obtain relief from medications.  Problems that are getting worse rather than better.  Troubling  side effects that you think are coming from the medication. Do not be discouraged if you do not obtain instant relief from the medications or help given you. Your caregiver can help you get through these episodes of pain with some persistence (continued trying) on your part also. Document Released: 06/05/2001 Document Revised: 09/03/2011 Document Reviewed: 06/11/2005 [facility] Patient Information 2014 [place], [place]. Fatigue Fatigue is a feeling of tiredness, lack of energy, lack of motivation, or feeling tired all the time. Having enough rest, good nutrition, and reducing stress will normally reduce fatigue. Consult your caregiver if it persists. The nature of your fatigue will help your caregiver to find out its cause. The treatment is based on the cause.  CAUSES  There are many causes for fatigue. Most of the time, fatigue can be traced to one or more of your habits or routines. Most causes fit into one or more of three general areas. They are: Lifestyle problems  Sleep disturbances.  Overwork.  Physical exertion.  Unhealthy habits.  Poor eating habits or eating disorders.  Alcohol and/or drug use .  Lack of proper nutrition (malnutrition). Psychological problems  Stress and/or anxiety problems.  Depression.  Grief.  Boredom. Medical Problems or Conditions  Anemia.  Pregnancy.  Thyroid gland problems.  Recovery from major surgery.  Continuous pain.  Emphysema or asthma that is not well controlled  Allergic conditions.  Diabetes.  Infections (such as mononucleosis).  Obesity.  Sleep disorders, such as sleep apnea.  Heart failure or other heart-related problems.  Cancer.  Kidney disease.  Liver disease.  Effects of certain medicines such as antihistamines, cough  and cold remedies, prescription pain medicines, heart and blood pressure medicines, drugs used for treatment of cancer, and some antidepressants. SYMPTOMS  The symptoms of fatigue include:    Lack of energy.  Lack of drive (motivation).  Drowsiness.  Feeling of indifference to the surroundings. DIAGNOSIS  The details of how you feel help guide your caregiver in finding out what is causing the fatigue. You will be asked about your present and past health condition. It is important to review all medicines that you take, including prescription and non-prescription items. A thorough exam will be done. You will be questioned about your feelings, habits, and normal lifestyle. Your caregiver may suggest blood tests, urine tests, or other tests to look for common medical causes of fatigue.  TREATMENT  Fatigue is treated by correcting the underlying cause. For example, if you have continuous pain or depression, treating these causes will improve how you feel. Similarly, adjusting the dose of certain medicines will help in reducing fatigue.  HOME CARE INSTRUCTIONS   Try to get the required amount of good sleep every night.  Eat a healthy and nutritious diet, and drink enough water throughout the day.  Practice ways of relaxing (including yoga or meditation).  Exercise regularly.  Make plans to change situations that cause stress. Act on those plans so that stresses decrease over time. Keep your work and personal routine reasonable.  Avoid street drugs and minimize use of alcohol.  Start taking a daily multivitamin after consulting your caregiver. SEEK MEDICAL CARE IF:   You have persistent tiredness, which cannot be accounted for.  You have fever.  You have unintentional weight loss.  You have headaches.  You have disturbed sleep throughout the night.  You are feeling sad.  You have constipation.  You have dry skin.  You have gained weight.  You are taking any new or different medicines that you suspect are causing fatigue.  You are unable to sleep at night.  You develop any unusual swelling of your legs or other parts of your body. SEEK IMMEDIATE MEDICAL CARE  IF:   You are feeling confused.  Your vision is blurred.  You feel faint or pass out.  You develop severe headache.  You develop severe abdominal, pelvic, or back pain.  You develop chest pain, shortness of breath, or an irregular or fast heartbeat.  You are unable to pass a normal amount of urine.  You develop abnormal bleeding such as bleeding from the rectum or you vomit blood.  You have thoughts about harming yourself or committing suicide.  You are worried that you might harm someone else. MAKE SURE YOU:   Understand these instructions.  Will watch your condition.  Will get help right away if you are not doing well or get worse. Document Released: 04/08/2007 Document Revised: 09/03/2011 Document Reviewed: 04/08/2007 [facility] Patient Information 2014 [place], [place].

## 2013-05-17 NOTE — Progress Notes (Signed)
  Subjective:    Patient ID: Debra White, female    DOB: 21-Apr-1934, 79 y.o.   MRN: [id number]  HPI Comments: 79 yo presents for F/U with new start of gabapentin for facial pain. She notes it has made the pain tolerable but cannot take the RX during the day because it makes her groggy. She has also noticed her BP is staying lower. She has noticed occasional dizziness without triggers other than BP being low.     Review of Systems  Constitutional: Positive for fatigue.  Neurological:       Nerve pain  All other systems reviewed and are negative.       Objective:   Physical Exam  Nursing note and vitals reviewed. Constitutional: She is oriented to person, place, and time. She appears well-developed and well-nourished.  HENT:  Head: Normocephalic and atraumatic.  Right Ear: External ear normal.  Left Ear: External ear normal.  Nose: Nose normal.  Mouth/Throat: Oropharynx is clear and moist.  Eyes: Pupils are equal, round, and reactive to light.  Neck: Normal range of motion. Neck supple. No thyromegaly present.  Cardiovascular: Normal rate, regular rhythm, normal heart sounds and intact distal pulses.   Pulmonary/Chest: Effort normal and breath sounds normal.  Musculoskeletal: Normal range of motion.  Lymphadenopathy:    She has no cervical adenopathy.  Neurological: She is alert and oriented to person, place, and time. She has normal reflexes.  Skin: Skin is warm and dry.  Psychiatric: She has a normal mood and affect. Judgment normal.          Assessment & Plan:  1. Facial pain-continue RX AD, check labs 2. LOW BP- Decrease Losartan to 1/2 qhs. Check BP w/c if >130/80 or <110/60

## 2013-05-18 ENCOUNTER — Ambulatory Visit: Payer: Self-pay | Admitting: Internal Medicine

## 2013-05-18 ENCOUNTER — Telehealth: Payer: Self-pay | Admitting: *Deleted

## 2013-05-18 NOTE — Telephone Encounter (Signed)
Message copied by Laurence Spates on Mon May 18, 2013 11:39 AM ------      Message from: [place], [place] R      Created: Sun May 17, 2013  9:47 PM       All labs normal,continue same. ------

## 2013-05-18 NOTE — Telephone Encounter (Signed)
Spoke with patient about lab results.

## 2013-05-25 ENCOUNTER — Other Ambulatory Visit: Payer: Self-pay | Admitting: Emergency Medicine

## 2013-05-25 MED ORDER — ATENOLOL 100 MG PO TABS: 100.0000 mg | ORAL_TABLET | ORAL | Status: DC

## 2013-07-08 ENCOUNTER — Ambulatory Visit (INDEPENDENT_AMBULATORY_CARE_PROVIDER_SITE_OTHER): Payer: Medicare Other | Admitting: Internal Medicine

## 2013-07-08 ENCOUNTER — Encounter: Payer: Self-pay | Admitting: Internal Medicine

## 2013-07-08 ENCOUNTER — Other Ambulatory Visit: Payer: Self-pay | Admitting: Internal Medicine

## 2013-07-08 VITALS — BP 140/70 | HR 64 | Temp 97.7°F | Resp 16 | Ht 64.0 in | Wt 105.0 lb

## 2013-07-08 DIAGNOSIS — E559 Vitamin D deficiency, unspecified: Secondary | ICD-10-CM | POA: Insufficient documentation

## 2013-07-08 DIAGNOSIS — Z79899 Other long term (current) drug therapy: Secondary | ICD-10-CM

## 2013-07-08 DIAGNOSIS — Z1212 Encounter for screening for malignant neoplasm of rectum: Secondary | ICD-10-CM

## 2013-07-08 DIAGNOSIS — E782 Mixed hyperlipidemia: Secondary | ICD-10-CM

## 2013-07-08 DIAGNOSIS — Z0000 Encounter for general adult medical examination without abnormal findings: Secondary | ICD-10-CM

## 2013-07-08 DIAGNOSIS — I10 Essential (primary) hypertension: Secondary | ICD-10-CM

## 2013-07-08 DIAGNOSIS — R7309 Other abnormal glucose: Secondary | ICD-10-CM

## 2013-07-08 DIAGNOSIS — Z1231 Encounter for screening mammogram for malignant neoplasm of breast: Secondary | ICD-10-CM

## 2013-07-08 LAB — CBC WITH DIFFERENTIAL/PLATELET
BASOS PCT: 0 % (ref 0–1)
Basophils Absolute: 0 10*3/uL (ref 0.0–0.1)
EOS ABS: 0.1 10*3/uL (ref 0.0–0.7)
EOS PCT: 2 % (ref 0–5)
HEMATOCRIT: 36.7 % (ref 36.0–46.0)
Hemoglobin: 12.6 g/dL (ref 12.0–15.0)
Lymphocytes Relative: 23 % (ref 12–46)
Lymphs Abs: 1.7 10*3/uL (ref 0.7–4.0)
MCH: 31.2 pg (ref 26.0–34.0)
MCHC: 34.3 g/dL (ref 30.0–36.0)
MCV: 90.8 fL (ref 78.0–100.0)
MONO ABS: 0.6 10*3/uL (ref 0.1–1.0)
MONOS PCT: 8 % (ref 3–12)
Neutro Abs: 4.7 10*3/uL (ref 1.7–7.7)
Neutrophils Relative %: 67 % (ref 43–77)
Platelets: 209 10*3/uL (ref 150–400)
RBC: 4.04 MIL/uL (ref 3.87–5.11)
RDW: 12.9 % (ref 11.5–15.5)
WBC: 7.1 10*3/uL (ref 4.0–10.5)

## 2013-07-08 LAB — BASIC METABOLIC PANEL WITH GFR
BUN: 12 mg/dL (ref 6–23)
CHLORIDE: 99 meq/L (ref 96–112)
CO2: 32 mEq/L (ref 19–32)
CREATININE: 0.62 mg/dL (ref 0.50–1.10)
Calcium: 9.4 mg/dL (ref 8.4–10.5)
GFR, Est African American: >89 mL/min
GFR, Est Non African American: 87 mL/min
GLUCOSE: 93 mg/dL (ref 70–99)
Potassium: 4.5 mEq/L (ref 3.5–5.3)
Sodium: 140 mEq/L (ref 135–145)

## 2013-07-08 LAB — LIPID PANEL
Cholesterol: 147 mg/dL (ref 0–200)
HDL: 72 mg/dL (ref >39)
LDL CALC: 53 mg/dL (ref 0–99)
TRIGLYCERIDES: 112 mg/dL (ref <150)
Total CHOL/HDL Ratio: 2 Ratio
VLDL: 22 mg/dL (ref 0–40)

## 2013-07-08 LAB — HEPATIC FUNCTION PANEL
ALK PHOS: 75 U/L (ref 39–117)
ALT: 13 U/L (ref 0–35)
AST: 17 U/L (ref 0–37)
Albumin: 4.4 g/dL (ref 3.5–5.2)
BILIRUBIN DIRECT: 0.2 mg/dL (ref 0.0–0.3)
BILIRUBIN INDIRECT: 0.5 mg/dL (ref 0.0–0.9)
Total Bilirubin: 0.7 mg/dL (ref 0.3–1.2)
Total Protein: 7.2 g/dL (ref 6.0–8.3)

## 2013-07-08 LAB — HEMOGLOBIN A1C
Hgb A1c MFr Bld: 5.8 % — ABNORMAL HIGH (ref <5.7)
Mean Plasma Glucose: 120 mg/dL — ABNORMAL HIGH (ref <117)

## 2013-07-08 LAB — MAGNESIUM: Magnesium: 1.8 mg/dL (ref 1.5–2.5)

## 2013-07-08 MED ORDER — LORAZEPAM 1 MG PO TABS: ORAL_TABLET | ORAL | Status: DC

## 2013-07-08 MED ORDER — ESOMEPRAZOLE MAGNESIUM 40 MG PO CPDR: 40.0000 mg | DELAYED_RELEASE_CAPSULE | Freq: Every day | ORAL | Status: AC

## 2013-07-08 NOTE — Patient Instructions (Addendum)
Hypertension As your heart beats, it forces blood through your arteries. This force is your blood pressure. If the pressure is too high, it is called hypertension (HTN) or high blood pressure. HTN is dangerous because you may have it and not know it. High blood pressure may mean that your heart has to work harder to pump blood. Your arteries may be narrow or stiff. The extra work puts you at risk for heart disease, stroke, and other problems.  Blood pressure consists of two numbers, a higher number over a lower, 110/72, for example. It is stated as "110 over 72." The ideal is below 120 for the top number (systolic) and under 80 for the bottom (diastolic). Write down your blood pressure today. You should pay close attention to your blood pressure if you have certain conditions such as:  Heart failure.  Prior heart attack.  Diabetes  Chronic kidney disease.  Prior stroke.  Multiple risk factors for heart disease. To see if you have HTN, your blood pressure should be measured while you are seated with your arm held at the level of the heart. It should be measured at least twice. A one-time elevated blood pressure reading (especially in the Emergency Department) does not mean that you need treatment. There may be conditions in which the blood pressure is different between your right and left arms. It is important to see your caregiver soon for a recheck. Most people have essential hypertension which means that there is not a specific cause. This type of high blood pressure may be lowered by changing lifestyle factors such as:  Stress.  Smoking.  Lack of exercise.  Excessive weight.  Drug/tobacco/alcohol use.  Eating less salt. Most people do not have symptoms from high blood pressure until it has caused damage to the body. Effective treatment can often prevent, delay or reduce that damage. TREATMENT  When a cause has been identified, treatment for high blood pressure is directed at the  cause. There are a large number of medications to treat HTN. These fall into several categories, and your caregiver will help you select the medicines that are best for you. Medications may have side effects. You should review side effects with your caregiver. If your blood pressure stays high after you have made lifestyle changes or started on medicines,   Your medication(s) may need to be changed.  Other problems may need to be addressed.  Be certain you understand your prescriptions, and know how and when to take your medicine.  Be sure to follow up with your caregiver within the time frame advised (usually within two weeks) to have your blood pressure rechecked and to review your medications.  If you are taking more than one medicine to lower your blood pressure, make sure you know how and at what times they should be taken. Taking two medicines at the same time can result in blood pressure that is too low. SEEK IMMEDIATE MEDICAL CARE IF:  You develop a severe headache, blurred or changing vision, or confusion.  You have unusual weakness or numbness, or a faint feeling.  You have severe chest or abdominal pain, vomiting, or breathing problems. MAKE SURE YOU:   Understand these instructions.  Will watch your condition.  Will get help right away if you are not doing well or get worse. Document Released: 06/11/2005 Document Revised: 09/03/2011 Document Reviewed: 01/30/2008 [employer] Patient Information 2014 [employer].  Diabetes and Exercise Exercising regularly is important. It is not just about losing weight. It   has many health benefits, such as:  Improving your overall fitness, flexibility, and endurance.  Increasing your bone density.  Helping with weight control.  Decreasing your body fat.  Increasing your muscle strength.  Reducing stress and tension.  Improving your overall health. People with diabetes who exercise gain additional benefits because  exercise:  Reduces appetite.  Improves the body's use of blood sugar (glucose).  Helps lower or control blood glucose.  Decreases blood pressure.  Helps control blood lipids (such as cholesterol and triglycerides).  Improves the body's use of the hormone insulin by:  Increasing the body's insulin sensitivity.  Reducing the body's insulin needs.  Decreases the risk for heart disease because exercising:  Lowers cholesterol and triglycerides levels.  Increases the levels of good cholesterol (such as high-density lipoproteins [HDL]) in the body.  Lowers blood glucose levels. YOUR ACTIVITY PLAN  Choose an activity that you enjoy and set realistic goals. Your health care provider or diabetes educator can help you make an activity plan that works for you. You can break activities into 2 or 3 sessions throughout the day. Doing so is as good as one long session. Exercise ideas include:  Taking the dog for a walk.  Taking the stairs instead of the elevator.  Dancing to your favorite song.  Doing your favorite exercise with a friend. RECOMMENDATIONS FOR EXERCISING WITH TYPE 1 OR TYPE 2 DIABETES   Check your blood glucose before exercising. If blood glucose levels are greater than 240 mg/dL, check for urine ketones. Do not exercise if ketones are present.  Avoid injecting insulin into areas of the body that are going to be exercised. For example, avoid injecting insulin into:  The arms when playing tennis.  The legs when jogging.  Keep a record of:  Food intake before and after you exercise.  Expected peak times of insulin action.  Blood glucose levels before and after you exercise.  The type and amount of exercise you have done.  Review your records with your health care provider. Your health care provider will help you to develop guidelines for adjusting food intake and insulin amounts before and after exercising.  If you take insulin or oral hypoglycemic agents, watch  for signs and symptoms of hypoglycemia. They include:  Dizziness.  Shaking.  Sweating.  Chills.  Confusion.  Drink plenty of water while you exercise to prevent dehydration or heat stroke. Body water is lost during exercise and must be replaced.  Talk to your health care provider before starting an exercise program to make sure it is safe for you. Remember, almost any type of activity is better than none. Document Released: 09/01/2003 Document Revised: 02/11/2013 Document Reviewed: 11/18/2012 [employer] Patient Information 2014 [employer].  Cholesterol Cholesterol is a white, waxy, fat-like protein needed by your body in small amounts. The liver makes all the cholesterol you need. It is carried from the liver by the blood through the blood vessels. Deposits (plaque) may build up on blood vessel walls. This makes the arteries narrower and stiffer. Plaque increases the risk for heart attack and stroke. You cannot feel your cholesterol level even if it is very high. The only way to know is by a blood test to check your lipid (fats) levels. Once you know your cholesterol levels, you should keep a record of the test results. Work with your caregiver to to keep your levels in the desired range. WHAT THE RESULTS MEAN:  Total cholesterol is a rough measure of all the cholesterol   in your blood.  LDL is the so-called bad cholesterol. This is the type that deposits cholesterol in the walls of the arteries. You want this level to be low.  HDL is the good cholesterol because it cleans the arteries and carries the LDL away. You want this level to be high.  Triglycerides are fat that the body can either burn for energy or store. High levels are closely linked to heart disease. DESIRED LEVELS:  Total cholesterol below 200.  LDL below 100 for people at risk, below 70 for very high risk.  HDL above 50 is good, above 60 is best.  Triglycerides below 150. HOW TO LOWER YOUR  CHOLESTEROL:  Diet.  Choose fish or white meat chicken and turkey, roasted or baked. Limit fatty cuts of red meat, fried foods, and processed meats, such as sausage and lunch meat.  Eat lots of fresh fruits and vegetables. Choose whole grains, beans, pasta, potatoes and cereals.  Use only small amounts of olive, corn or canola oils. Avoid butter, mayonnaise, shortening or palm kernel oils. Avoid foods with trans-fats.  Use skim/nonfat milk and low-fat/nonfat yogurt and cheeses. Avoid whole milk, cream, ice cream, egg yolks and cheeses. Healthy desserts include angel food cake, ginger snaps, animal crackers, hard candy, popsicles, and low-fat/nonfat frozen yogurt. Avoid pastries, cakes, pies and cookies.  Exercise.  A regular program helps decrease LDL and raises HDL.  Helps with weight control.  Do things that increase your activity level like gardening, walking, or taking the stairs.  Medication.  May be prescribed by your caregiver to help lowering cholesterol and the risk for heart disease.  You may need medicine even if your levels are normal if you have several risk factors. HOME CARE INSTRUCTIONS   Follow your diet and exercise programs as suggested by your caregiver.  Take medications as directed.  Have blood work done when your caregiver feels it is necessary. MAKE SURE YOU:   Understand these instructions.  Will watch your condition.  Will get help right away if you are not doing well or get worse. Document Released: 03/06/2001 Document Revised: 09/03/2011 Document Reviewed: 03/25/2013 [employer] Patient Information 2014 [employer].  Vitamin D Deficiency Vitamin D is an important vitamin that your body needs. Having too little of it in your body is called a deficiency. A very bad deficiency can make your bones soft and can cause a condition called rickets.  Vitamin D is important to your body for different reasons, such as:   It helps your body absorb 2  minerals called calcium and phosphorus.  It helps make your bones healthy.  It may prevent some diseases, such as diabetes and multiple sclerosis.  It helps your muscles and heart. You can get vitamin D in several ways. It is a natural part of some foods. The vitamin is also added to some dairy products and cereals. Some people take vitamin D supplements. Also, your body makes vitamin D when you are in the sun. It changes the sun's rays into a form of the vitamin that your body can use. CAUSES   Not eating enough foods that contain vitamin D.  Not getting enough sunlight.  Having certain digestive system diseases that make it hard to absorb vitamin D. These diseases include Crohn's disease, chronic pancreatitis, and cystic fibrosis.  Having a surgery in which part of the stomach or small intestine is removed.  Being obese. Fat cells pull vitamin D out of your blood. That means that obese people   may not have enough vitamin D left in their blood and in other body tissues.  Having chronic kidney or liver disease. RISK FACTORS Risk factors are things that make you more likely to develop a vitamin D deficiency. They include:  Being older.  Not being able to get outside very much.  Living in a nursing home.  Having had broken bones.  Having weak or thin bones (osteoporosis).  Having a disease or condition that changes how your body absorbs vitamin D.  Having dark skin.  Some medicines such as seizure medicines or steroids.  Being overweight or obese. SYMPTOMS Mild cases of vitamin D deficiency may not have any symptoms. If you have a very bad case, symptoms may include:  Bone pain.  Muscle pain.  Falling often.  Broken bones caused by a minor injury, due to osteoporosis. DIAGNOSIS A blood test is the best way to tell if you have a vitamin D deficiency. TREATMENT Vitamin D deficiency can be treated in different ways. Treatment for vitamin D deficiency depends on what is  causing it. Options include:  Taking vitamin D supplements.  Taking a calcium supplement. Your caregiver will suggest what dose is best for you. HOME CARE INSTRUCTIONS  Take any supplements that your caregiver prescribes. Follow the directions carefully. Take only the suggested amount.  Have your blood tested 2 months after you start taking supplements.  Eat foods that contain vitamin D. Healthy choices include:  Fortified dairy products, cereals, or juices. Fortified means vitamin D has been added to the food. Check the label on the package to be sure.  Fatty fish like salmon or trout.  Eggs.  Oysters.  Do not use a tanning bed.  Keep your weight at a healthy level. Lose weight if you need to.  Keep all follow-up appointments. Your caregiver will need to perform blood tests to make sure your vitamin D deficiency is going away. SEEK MEDICAL CARE IF:  You have any questions about your treatment.  You continue to have symptoms of vitamin D deficiency.  You have nausea or vomiting.  You are constipated.  You feel confused.  You have severe abdominal or back pain. MAKE SURE YOU:  Understand these instructions.  Will watch your condition.  Will get help right away if you are not doing well or get worse. Document Released: 09/03/2011 Document Revised: 10/06/2012 Document Reviewed: 09/03/2011 [employer] Patient Information 2014 [employer].  

## 2013-07-08 NOTE — Progress Notes (Signed)
Patient ID: Debra White, female   DOB: 1934-04-27, 80 y.o.   MRN: [id number]  Annual Screening Comprehensive Examination  This very nice 80 y.o. MWF presents for complete physical.  Patient has been followed for HTN,  Prediabetes, Hyperlipidemia, and Vitamin D Deficiency.    HTN predates since 1998. Patient's BP has been controlled at home. Today's BP: 140/70 mmHg. BUN/Creat 20/0.75 in Sept showed GFR 77 consistant with mild renal insufficiency. Patient denies any cardiac symptoms as chest pain, palpitations, shortness of breath, dizziness or ankle swelling.   Patient's hyperlipidemia is controlled with diet and medications. Patient denies myalgias or other medication SE's. Last cholesterol last visit was 166, triglycerides 125, HDL 81 and LDL 60 in Sept 2014 - all at goal.     Patient has prediabetes with A1c 6.5% in 9/2011and wit weight loss last A1c 5.8 % in Sept 2014. Patient denies reactive hypoglycemic symptoms, visual blurring, diabetic polys, or paresthesias.    Also, she fell in jan 2013 sustaining a Rt orbital fracture followed by Dr Polly Cobia. Her Fx healed but she continues to have neuropathic pains of the Rt face and lip and has had consequent decreased oral intake and lost approx. 20 # weight since that time.   Finally, patient has history of Vitamin D Deficiency 56 in 2008 with last vitamin D was 72 in Sept 2014.       Medication List       atenolol 100 MG tablet  Commonly known as:  TENORMIN  Take 1 tablet (100 mg total) by mouth every morning.     cholecalciferol 1000 UNITS tablet  Commonly known as:  VITAMIN D  Take 2,000 Units by mouth daily.     esomeprazole 40 MG capsule  Commonly known as:  NEXIUM  Take 1 capsule (40 mg total) by mouth daily.     FLORAJEN3 PO  Take by mouth daily.     gabapentin 100 MG capsule  Commonly known as:  NEURONTIN  Take 100 mg by mouth 2 (two) times daily. Take 2 tabs at HS     GAS RELIEF PO  Take by mouth daily. OTC      HYDROcodone-acetaminophen 5-325 MG per tablet  Commonly known as:  NORCO/VICODIN  Take 2 tablets by mouth every 4 (four) hours as needed for pain.     hyoscyamine 0.125 MG SL tablet  Commonly known as:  LEVSIN SL  Place 0.125 mg under the tongue every 4 (four) hours as needed for cramping (nausea).     LORazepam 1 MG tablet  Commonly known as:  ATIVAN  1/2 to 1 tablet 3 x day for nerves     losartan 100 MG tablet  Commonly known as:  COZAAR  Take 50 mg by mouth daily.     pravastatin 40 MG tablet  Commonly known as:  PRAVACHOL  Take 40 mg by mouth daily.        Allergies  Allergen Reactions  . Latex Hives    TONGUE HAD BLISTERS WHEN HAD DENTAL SURGERY  . Amoxicillin Nausea And Vomiting  . Chlorhexidine Gluconate Itching  . Aspirin     Gi upset  . Barbiturates   . Celebrex [Celecoxib]   . Codeine Itching  . Evista [Raloxifene]   . Lipitor [Atorvastatin]   . Morphine And Related   . Nsaids   . Oruvail [Ketoprofen]   . Paraffin   . Prochlorperazine Other (See Comments)    Uncontrolled shaking  . Proloprim [Trimethoprim]   .  Vibra-Tab [Doxycycline]   . Vioxx [Rofecoxib]     edema  . Betadine [Povidone Iodine] Rash  . Caffeine Palpitations    Past Medical History  Diagnosis Date  . Fibromyalgia   . Anxiety   . Dysrhythmia     RX  . Blood transfusion   . Chronic kidney disease     STONES  . Headache(784.0)   . Arthritis   . Cataract   . Diverticulosis of colon (without mention of hemorrhage) 2007    Colonoscopy  . Candida esophagitis 2013    EGD  . Family history of colon cancer     sister  . Hypertension   . Hyperlipemia   . GERD (gastroesophageal reflux disease)   . Emphysema of lung     Past Surgical History  Procedure Laterality Date  . Abdominal hysterectomy    . Hand surgery      BIL   . Shoulder arthroscopy w/ rotator cuff repair      LFT  . Cervical disc surgery    . Back surgery      X2  . Orif tripod fracture  07/13/2011     Procedure: OPEN REDUCTION INTERNAL FIXATION (ORIF) TRIPOD FRACTURE;  Surgeon: [name], MD;  Location: [facility];  Service: ENT;  Laterality: Right;  ORIF RIGHT ZYGOMA, ORBITAL FLOOR EXPLORATION WITH FROST STITCH (TEMPORARY TARSORRHAPHY)  . Colonoscopy    . Facial fracture surgery    . Back surgery      Family History  Problem Relation Age of Onset  . Heart disease Mother   . Breast cancer Sister   . Colon cancer Sister   . Cancer Sister     breast  . Cancer Sister     colon  . Cancer Sister     colon  . Heart disease Father   . Hypertension Daughter   . Hyperlipidemia Daughter     History  Substance Use Topics  . Smoking status: Never Smoker   . Smokeless tobacco: Never Used  . Alcohol Use: No    ROS Constitutional: Denies fever, chills, weight loss/gain, headaches, insomnia, fatigue, night sweats, and change in appetite. Eyes: Denies redness, blurred vision, diplopia, discharge, itchy, watery eyes.  ENT: Denies discharge, congestion, post nasal drip, epistaxis, sore throat, earache, hearing loss, dental pain, Tinnitus, Vertigo, Sinus pain, snoring.  Cardio: Denies chest pain, palpitations, irregular heartbeat, syncope, dyspnea, diaphoresis, orthopnea, PND, claudication, edema Respiratory: denies cough, dyspnea, DOE, pleurisy, hoarseness, laryngitis, wheezing.  Gastrointestinal: Denies dysphagia, heartburn, reflux, water brash, pain, cramps, nausea, vomiting, bloating, diarrhea, constipation, hematemesis, melena, hematochezia, jaundice, hemorrhoids Genitourinary: Denies dysuria, frequency, urgency, nocturia, hesitancy, discharge, hematuria, flank pain Breast:Breast lumps, nipple discharge, bleeding.  Musculoskeletal: Denies arthralgia, myalgia, stiffness, Jt. Swelling, pain, limp, and strain/sprain. Skin: Denies puritis, rash, hives, warts, acne, eczema, changing in skin lesion Neuro: No weakness, tremor, incoordination, spasms, paresthesia, but has Rt facial pains as  above.Psychiatric: Denies confusion, memory loss, sensory loss Endocrine: Denies change in weight, skin, hair change, nocturia, and paresthesia, diabetic polys, visual blurring, hyper / hypo glycemic episodes.  Heme/Lymph: No excessive bleeding, bruising, enlarged lymph nodes.  BP: 140/70  Pulse: 64  Temp: 97.7 F (36.5 C)  Resp: 16    body mass index    18.01 kg/(m^2)    Height       5\' 4"     Weight     105 lb   Physical Exam General Appearance: Well nourished, in no apparent distress. Eyes: PERRLA, EOMs, conjunctiva no  swelling or erythema, normal fundi and vessels. Sinuses: No frontal/maxillary tenderness ENT/Mouth: EACs patent / TMs  nl. Nares clear without erythema, swelling, mucoid exudates. Oral hygiene is good. No erythema, swelling, or exudate. Tongue normal, non-obstructing. Tonsils not swollen or erythematous. Hearing normal.  Neck: Supple, thyroid normal. No bruits, nodes or JVD. Respiratory: Respiratory effort normal.  BS equal and clear bilateral without rales, rhonci, wheezing or stridor. Cardio: Heart sounds are normal with regular rate and rhythm and no murmurs, rubs or gallops. Peripheral pulses are normal and equal bilaterally without edema. No aortic or femoral bruits. Chest: symmetric with normal excursions and percussion. Breasts: Symmetric, without lumps, nipple discharge, retractions, or fibrocystic changes.  Abdomen: Flat, soft, with bowl sounds. Nontender, no guarding, rebound, hernias, masses, or organomegaly.  Lymphatics: Non tender without lymphadenopathy.  Musculoskeletal: Full ROM all peripheral extremities, joint stability, 5/5 strength, and normal gait. Skin: Warm and dry without rashes, lesions, cyanosis, clubbing or  ecchymosis.  Neuro: Cranial nerves intact, reflexes equal bilaterally. Normal muscle tone, no cerebellar symptoms. Sensation intact.  Pysch: Awake and oriented X 3, normal affect, Insight and Judgment appropriate.   Assessment and  Plan  1. Annual Screening Examination 2. Hypertension  3. Hyperlipidemia 4. Pre Diabetes 5. Vitamin D Deficiency 6. Neuropathic Rt Facial Pain   Continue prudent diet as discussed, weight control, BP monitoring, regular exercise, and medications. Discussed med's effects and SE's. Screening labs and tests as requested with regular follow-up as recommended.

## 2013-07-09 LAB — INSULIN, FASTING: INSULIN FASTING, SERUM: 5 u[IU]/mL (ref 3–28)

## 2013-07-09 LAB — URINALYSIS, MICROSCOPIC ONLY
Bacteria, UA: NONE SEEN
CASTS: NONE SEEN
CRYSTALS: NONE SEEN
Squamous Epithelial / LPF: NONE SEEN

## 2013-07-09 LAB — VITAMIN D 25 HYDROXY (VIT D DEFICIENCY, FRACTURES): VIT D 25 HYDROXY: 55 ng/mL (ref 30–89)

## 2013-07-09 LAB — MICROALBUMIN / CREATININE URINE RATIO
Creatinine, Urine: 59.8 mg/dL
Microalb Creat Ratio: 13.4 mg/g (ref 0.0–30.0)
Microalb, Ur: 0.8 mg/dL (ref 0.00–1.89)

## 2013-07-09 LAB — TSH: TSH: 0.819 u[IU]/mL (ref 0.350–4.500)

## 2013-07-21 ENCOUNTER — Other Ambulatory Visit: Payer: Self-pay | Admitting: Internal Medicine

## 2013-07-21 DIAGNOSIS — M948X9 Other specified disorders of cartilage, unspecified sites: Secondary | ICD-10-CM

## 2013-07-22 ENCOUNTER — Ambulatory Visit (HOSPITAL_COMMUNITY)
Admission: RE | Admit: 2013-07-22 | Discharge: 2013-07-22 | Disposition: A | Discharge status: Home / Self Care | Payer: Medicare Other | Source: Ambulatory Visit | Attending: Internal Medicine | Admitting: Internal Medicine

## 2013-07-22 DIAGNOSIS — Z1231 Encounter for screening mammogram for malignant neoplasm of breast: Secondary | ICD-10-CM | POA: Insufficient documentation

## 2013-07-24 ENCOUNTER — Other Ambulatory Visit (INDEPENDENT_AMBULATORY_CARE_PROVIDER_SITE_OTHER): Payer: Medicare Other | Admitting: *Deleted

## 2013-07-24 DIAGNOSIS — Z1212 Encounter for screening for malignant neoplasm of rectum: Secondary | ICD-10-CM

## 2013-07-24 LAB — POC HEMOCCULT BLD/STL (HOME/3-CARD/SCREEN)
Card #2 Fecal Occult Blod, POC: NEGATIVE
FECAL OCCULT BLD: NEGATIVE
Fecal Occult Blood, POC: NEGATIVE

## 2013-08-10 ENCOUNTER — Ambulatory Visit (INDEPENDENT_AMBULATORY_CARE_PROVIDER_SITE_OTHER): Payer: Medicare Other | Admitting: Physician Assistant

## 2013-08-10 ENCOUNTER — Encounter: Payer: Self-pay | Admitting: Physician Assistant

## 2013-08-10 VITALS — BP 128/68 | HR 64 | Temp 98.1°F | Resp 16 | Wt 105.0 lb

## 2013-08-10 DIAGNOSIS — N3000 Acute cystitis without hematuria: Secondary | ICD-10-CM

## 2013-08-10 DIAGNOSIS — S0450XA Injury of facial nerve, unspecified side, initial encounter: Secondary | ICD-10-CM

## 2013-08-10 DIAGNOSIS — F411 Generalized anxiety disorder: Secondary | ICD-10-CM

## 2013-08-10 DIAGNOSIS — K219 Gastro-esophageal reflux disease without esophagitis: Secondary | ICD-10-CM

## 2013-08-10 DIAGNOSIS — R110 Nausea: Secondary | ICD-10-CM

## 2013-08-10 LAB — HEPATIC FUNCTION PANEL
ALK PHOS: 80 U/L (ref 39–117)
ALT: 15 U/L (ref 0–35)
AST: 18 U/L (ref 0–37)
Albumin: 4.6 g/dL (ref 3.5–5.2)
BILIRUBIN DIRECT: 0.1 mg/dL (ref 0.0–0.3)
BILIRUBIN INDIRECT: 0.6 mg/dL (ref 0.2–1.2)
Total Bilirubin: 0.7 mg/dL (ref 0.2–1.2)
Total Protein: 7.2 g/dL (ref 6.0–8.3)

## 2013-08-10 LAB — CBC WITH DIFFERENTIAL/PLATELET
Basophils Absolute: 0 10*3/uL (ref 0.0–0.1)
Basophils Relative: 0 % (ref 0–1)
Eosinophils Absolute: 0 10*3/uL (ref 0.0–0.7)
Eosinophils Relative: 0 % (ref 0–5)
HCT: 38.3 % (ref 36.0–46.0)
HEMOGLOBIN: 13.3 g/dL (ref 12.0–15.0)
LYMPHS ABS: 1.6 10*3/uL (ref 0.7–4.0)
Lymphocytes Relative: 16 % (ref 12–46)
MCH: 31.4 pg (ref 26.0–34.0)
MCHC: 34.7 g/dL (ref 30.0–36.0)
MCV: 90.5 fL (ref 78.0–100.0)
MONOS PCT: 10 % (ref 3–12)
Monocytes Absolute: 1 10*3/uL (ref 0.1–1.0)
NEUTROS ABS: 7.3 10*3/uL (ref 1.7–7.7)
Neutrophils Relative %: 74 % (ref 43–77)
Platelets: 258 10*3/uL (ref 150–400)
RBC: 4.23 MIL/uL (ref 3.87–5.11)
RDW: 12.8 % (ref 11.5–15.5)
WBC: 9.8 10*3/uL (ref 4.0–10.5)

## 2013-08-10 LAB — URINALYSIS, ROUTINE W REFLEX MICROSCOPIC
Bilirubin Urine: NEGATIVE
GLUCOSE, UA: NEGATIVE mg/dL
Hgb urine dipstick: NEGATIVE
Ketones, ur: NEGATIVE mg/dL
LEUKOCYTES UA: NEGATIVE
Nitrite: NEGATIVE
PH: 6 (ref 5.0–8.0)
Protein, ur: NEGATIVE mg/dL
Specific Gravity, Urine: 1.018 (ref 1.005–1.030)
Urobilinogen, UA: 0.2 mg/dL (ref 0.0–1.0)

## 2013-08-10 LAB — BASIC METABOLIC PANEL WITH GFR
BUN: 10 mg/dL (ref 6–23)
CHLORIDE: 91 meq/L — AB (ref 96–112)
CO2: 31 meq/L (ref 19–32)
Calcium: 9.2 mg/dL (ref 8.4–10.5)
Creat: 0.71 mg/dL (ref 0.50–1.10)
GFR, Est African American: >89 mL/min
GFR, Est Non African American: 82 mL/min
GLUCOSE: 101 mg/dL — AB (ref 70–99)
POTASSIUM: 4.2 meq/L (ref 3.5–5.3)
SODIUM: 129 meq/L — AB (ref 135–145)

## 2013-08-10 LAB — TSH: TSH: 1.509 u[IU]/mL (ref 0.350–4.500)

## 2013-08-10 MED ORDER — CLONAZEPAM 0.5 MG PO TABS: 0.5000 mg | ORAL_TABLET | Freq: Two times a day (BID) | ORAL | Status: DC

## 2013-08-10 MED ORDER — ONDANSETRON HCL 4 MG PO TABS: 4.0000 mg | ORAL_TABLET | Freq: Three times a day (TID) | ORAL | Status: DC | PRN

## 2013-08-10 MED ORDER — SUCRALFATE 1 GM/10ML PO SUSP: 1.0000 g | Freq: Three times a day (TID) | ORAL | Status: DC | PRN

## 2013-08-10 MED ORDER — ALPRAZOLAM 1 MG PO TABS: 1.0000 mg | ORAL_TABLET | Freq: Three times a day (TID) | ORAL | Status: DC | PRN

## 2013-08-10 NOTE — Progress Notes (Signed)
   Subjective:    Patient ID: Debra White, female    DOB: 12-23-1933, 80 y.o.   MRN: [id number]  HPI 80 y.o. female presents with nausea. She was recently here for a physical and states that she was switched from Xanax to Lorazepam due to insurance reasons and that she has had nausea, anxiety and trouble sleeping since starting on this new medications. + GERD, nausea, hot flashes with sweating, and increase urinary symptoms for 2-3 days. Denies CP, SOB.   Also, she fell in jan 2013 sustaining a Rt orbital fracture followed by Dr Polly Cobia. Her Fx healed but she continues to have neuropathic pains of the Rt face. She states she continues to have severe pain bilateral face,teeth, worse with cold air or hot air on her face.   Review of Systems  Constitutional: Positive for diaphoresis and appetite change. Negative for fever, chills and fatigue.  HENT: Positive for tinnitus. Negative for dental problem, drooling, hearing loss, mouth sores, nosebleeds, postnasal drip, sore throat, trouble swallowing and voice change.   Eyes: Negative.  Negative for visual disturbance.  Respiratory: Negative.   Cardiovascular: Negative.   Gastrointestinal: Positive for nausea. Negative for vomiting, abdominal pain, diarrhea, constipation and blood in stool.  Endocrine: Positive for heat intolerance.  Genitourinary: Positive for urgency and frequency. Negative for dysuria, vaginal bleeding, vaginal discharge, difficulty urinating and vaginal pain.  Neurological: Positive for weakness. Negative for dizziness, seizures, facial asymmetry, speech difficulty, light-headedness, numbness and headaches.       Objective:   Physical Exam  Constitutional: She is oriented to person, place, and time. She appears well-developed. She appears cachectic. No distress.  HENT:  Head: Normocephalic and atraumatic.  + TMJ tenderness, and very tender bilateral sinuses  Eyes: Conjunctivae are normal. Pupils are equal, round, and  reactive to light.  Neck: Normal range of motion. Neck supple.  Cardiovascular: Normal rate, regular rhythm and normal heart sounds.   Pulmonary/Chest: Effort normal and breath sounds normal. She has no wheezes. She has no rales. She exhibits no tenderness.  Abdominal: Soft. Bowel sounds are normal. There is no tenderness. There is no rebound and no guarding.  Musculoskeletal: Normal range of motion. She exhibits no edema and no tenderness.  Lymphadenopathy:    She has no cervical adenopathy.  Neurological: She is alert and oriented to person, place, and time. No cranial nerve deficit. Coordination normal.  Skin: Skin is warm and dry. No rash noted.       Assessment & Plan:  Nausea/GERD-? From medications change versus UTI/Infection/GERD/anxiety -continue nexium, do zofran, carafate in liquid - stop lorazepam  Neuropathy- very intolerant to several medications, will continue to follow up with Dr. [name].  - try klonopin 0.5 and if that does not help go back to xanax -continue gabapentin ? Need for neurologist- declines referral at this time  Anxiety- try klonopin- very intolerant to medications  If any worse go to ER especially if you have CP, SOB, AB pain, fever, chill, unable to eat/drink, severe weakness.  OVER 30 minutes of exam, counseling, chart review

## 2013-08-10 NOTE — Patient Instructions (Signed)
Please stay on the nexium. Can take zofran, put under you tongue and can take every 4 hours as needed Carafate- please dissolve in water and can take up to two times daily, it coats your stomach.  Can try klonopin 0.5, please try 1/2 pill at first and you can go up to 1 pill up to 3 times daily if needed. If you have ANY symptoms with this medication please stop.   If symptoms get worse go to the ER.

## 2013-08-11 ENCOUNTER — Other Ambulatory Visit: Payer: Self-pay | Admitting: Physician Assistant

## 2013-08-11 LAB — URINE CULTURE: Colony Count: 5000

## 2013-08-13 ENCOUNTER — Other Ambulatory Visit: Payer: Self-pay | Admitting: Physician Assistant

## 2013-08-13 DIAGNOSIS — E871 Hypo-osmolality and hyponatremia: Secondary | ICD-10-CM

## 2013-08-17 ENCOUNTER — Ambulatory Visit (INDEPENDENT_AMBULATORY_CARE_PROVIDER_SITE_OTHER): Payer: Medicare Other | Admitting: Physician Assistant

## 2013-08-17 ENCOUNTER — Other Ambulatory Visit: Payer: Self-pay

## 2013-08-17 ENCOUNTER — Encounter: Payer: Self-pay | Admitting: Physician Assistant

## 2013-08-17 VITALS — BP 138/68 | HR 60 | Temp 98.1°F | Resp 16 | Wt 105.0 lb

## 2013-08-17 DIAGNOSIS — M25511 Pain in right shoulder: Secondary | ICD-10-CM

## 2013-08-17 DIAGNOSIS — K219 Gastro-esophageal reflux disease without esophagitis: Secondary | ICD-10-CM

## 2013-08-17 DIAGNOSIS — M25519 Pain in unspecified shoulder: Secondary | ICD-10-CM

## 2013-08-17 DIAGNOSIS — E871 Hypo-osmolality and hyponatremia: Secondary | ICD-10-CM

## 2013-08-17 DIAGNOSIS — F411 Generalized anxiety disorder: Secondary | ICD-10-CM

## 2013-08-17 LAB — BASIC METABOLIC PANEL WITH GFR
BUN: 13 mg/dL (ref 6–23)
CO2: 30 meq/L (ref 19–32)
CREATININE: 0.69 mg/dL (ref 0.50–1.10)
Calcium: 9.1 mg/dL (ref 8.4–10.5)
Chloride: 96 mEq/L (ref 96–112)
GFR, Est African American: >89 mL/min
GFR, Est Non African American: 84 mL/min
GLUCOSE: 81 mg/dL (ref 70–99)
Potassium: 4.5 mEq/L (ref 3.5–5.3)
Sodium: 133 mEq/L — ABNORMAL LOW (ref 135–145)

## 2013-08-17 MED ORDER — CLONAZEPAM 0.5 MG PO TABS: 0.5000 mg | ORAL_TABLET | Freq: Three times a day (TID) | ORAL | Status: DC | PRN

## 2013-08-17 MED ORDER — DEXAMETHASONE SODIUM PHOSPHATE 100 MG/10ML IJ SOLN
10.0000 mg | Freq: Once | INTRAMUSCULAR | Status: AC
  Administered 2013-08-17: 10 mg via INTRAMUSCULAR

## 2013-08-17 NOTE — Patient Instructions (Signed)
Hyponatremia  Hyponatremia is when the salt (sodium) in your blood is low. When salt becomes low, your cells take in extra water and puff up (swell). The puffiness can happen in the whole body. It mostly affects the brain and is very serious.  HOME CARE  Only take medicine as told by your doctor.  Follow any diet instructions you were given. This includes limiting how much fluid you drink.  Keep all doctor visits for tests as told.  Avoid alcohol and drugs. GET HELP RIGHT AWAY IF:  You start to twitch and shake (seize).  You pass out (faint).  You continue to have watery poop (diarrhea) or you throw up (vomit).  You feel sick to your stomach (nauseous).  You are tired (fatigued), have a headache, are confused, or feel weak.  Your problems that first brought you to the doctor come back.  You have trouble following your diet instructions. MAKE SURE YOU:   Understand these instructions.  Will watch your condition.  Will get help right away if you are not doing well or get worse. Document Released: 02/21/2011 Document Revised: 09/03/2011 Document Reviewed: 02/21/2011 [facility] Patient Information 2014 [place], [place]. Shoulder Pain The shoulder is the joint that connects your arm to your body. Muscles and band-like tissues that connect bones to muscles (tendons) hold the joint together. Shoulder pain is felt if an injury or medical problem affects one or more parts of the shoulder. HOME CARE   Put ice on the sore area.  Put ice in a plastic bag.  Place a towel between your skin and the bag.  Leave the ice on for 15-20 minutes, 03-04 times a day for the first 2 days.  Stop using cold packs if they do not help with the pain.  If you were given something to keep your shoulder from moving (sling, shoulder immobilizer), wear it as told. Only take it off to shower or bathe.  Move your arm as little as possible, but keep your hand moving to prevent puffiness  (swelling).  Squeeze a soft ball or foam pad as much as possible to help prevent swelling.  Take medicine as told by your doctor. GET HELP RIGHT AWAY IF:   Your arm, hand, or fingers are numb or tingling.  Your arm, hand, or fingers are puffy (swollen), painful, or turn white or blue.  You have more pain.  You have progressing new pain in your arm, hand, or fingers.  Your hand or fingers get cold.  Your medicine does not help lessen your pain. MAKE SURE YOU:   Understand these instructions.  Will watch your condition.  Will get help right away if you are not doing well or get worse. Document Released: 11/28/2007 Document Revised: 03/05/2012 Document Reviewed: 12/24/2011 [facility] Patient Information 2014 [place], [place].

## 2013-08-17 NOTE — Progress Notes (Signed)
   Subjective:    Patient ID: Debra White, female    DOB: 1934/02/19, 80 y.o.   MRN: [id number]  HPI 79 y.o. female presents for follow up after medication change, anxiety, and GERD/nausea. Her sodium was found to be 129. She was unable to get the Carafate for the nausea but states she is feeling much better, not as much nausea, uses levsin/zofran and it helps. She was put on Klonopin 0.5 mg and takes 1/2 4 times a day and states it has helped without any side effects. She has been anxious because of her husband is having a difficult time breathing, and was taken to the ER this AM for epistaxis.  Lab Results  Component Value Date   CREATININE 0.71 08/10/2013   BUN 10 08/10/2013   NA 129* 08/10/2013   K 4.2 08/10/2013   CL 91* 08/10/2013   CO2 31 08/10/2013   She also complains of her right shoulder, states she has had an injection before that has helped in the past.   Review of Systems  Constitutional: Negative.   HENT: Negative.   Respiratory: Negative.   Cardiovascular: Negative.   Gastrointestinal: Positive for nausea (better). Negative for vomiting, abdominal pain, diarrhea, constipation, blood in stool, abdominal distention and rectal pain.  Genitourinary: Negative.   Musculoskeletal: Positive for arthralgias. Negative for back pain, gait problem, joint swelling, myalgias, neck pain and neck stiffness.  Neurological: Negative.        Objective:   Physical Exam  Constitutional: She is oriented to person, place, and time. She appears well-developed and well-nourished.  HENT:  Head: Normocephalic and atraumatic.  Neck: Normal range of motion. Neck supple.  Cardiovascular: Normal rate and regular rhythm.   Pulmonary/Chest: Effort normal and breath sounds normal.  Abdominal: Soft. Bowel sounds are normal.  Musculoskeletal:       Right shoulder: She exhibits decreased range of motion (pain with abduction but able to go to 150), tenderness (bicep tendon tenderness), crepitus and pain.  She exhibits no bony tenderness, no swelling, no effusion, no deformity, no laceration, no spasm, normal pulse and normal strength.  Lymphadenopathy:    She has no cervical adenopathy.  Neurological: She is alert and oriented to person, place, and time. She has normal reflexes.  Skin: Skin is warm and dry. No rash noted.      Assessment & Plan:  Hyponatremia- check BMP Nausea- ? From meds versus NA- feels better- check sodium Anxiety- Klonopin 0.5 TID.  Right shoulder pain- biceps tendonitis versus rotator cuff tendonitis/OA- A trigger point injection was performed at the site of maximal tenderness using 1% plain Lidocaine and Dexamethasone. This was well tolerated, and followed by immediate relief of pain., If it does not get better will get Xray and refer to ortho, if worse with SOB, CP go to ER.   OVER 30 minutes of exam, counseling, chart review, referral performed

## 2013-08-26 ENCOUNTER — Other Ambulatory Visit: Payer: Self-pay | Admitting: Internal Medicine

## 2013-08-26 MED ORDER — GABAPENTIN 100 MG PO CAPS: ORAL_CAPSULE | ORAL | Status: DC

## 2013-09-22 ENCOUNTER — Encounter: Payer: Self-pay | Admitting: Physician Assistant

## 2013-09-22 ENCOUNTER — Ambulatory Visit (INDEPENDENT_AMBULATORY_CARE_PROVIDER_SITE_OTHER): Payer: Medicare Other | Admitting: Physician Assistant

## 2013-09-22 VITALS — BP 128/60 | HR 64 | Temp 98.1°F | Resp 16 | Wt 105.0 lb

## 2013-09-22 DIAGNOSIS — E2839 Other primary ovarian failure: Secondary | ICD-10-CM

## 2013-09-22 DIAGNOSIS — M948X9 Other specified disorders of cartilage, unspecified sites: Secondary | ICD-10-CM

## 2013-09-22 DIAGNOSIS — G500 Trigeminal neuralgia: Secondary | ICD-10-CM

## 2013-09-22 DIAGNOSIS — IMO0001 Reserved for inherently not codable concepts without codable children: Secondary | ICD-10-CM

## 2013-09-22 DIAGNOSIS — R51 Headache: Secondary | ICD-10-CM

## 2013-09-22 LAB — CBC WITH DIFFERENTIAL/PLATELET
Basophils Absolute: 0 10*3/uL (ref 0.0–0.1)
Basophils Relative: 0 % (ref 0–1)
Eosinophils Absolute: 0.2 10*3/uL (ref 0.0–0.7)
Eosinophils Relative: 2 % (ref 0–5)
HEMATOCRIT: 37.2 % (ref 36.0–46.0)
HEMOGLOBIN: 12.8 g/dL (ref 12.0–15.0)
LYMPHS ABS: 2.1 10*3/uL (ref 0.7–4.0)
Lymphocytes Relative: 28 % (ref 12–46)
MCH: 30.4 pg (ref 26.0–34.0)
MCHC: 34.4 g/dL (ref 30.0–36.0)
MCV: 88.4 fL (ref 78.0–100.0)
MONO ABS: 0.5 10*3/uL (ref 0.1–1.0)
MONOS PCT: 7 % (ref 3–12)
NEUTROS ABS: 4.7 10*3/uL (ref 1.7–7.7)
NEUTROS PCT: 63 % (ref 43–77)
Platelets: 236 10*3/uL (ref 150–400)
RBC: 4.21 MIL/uL (ref 3.87–5.11)
RDW: 13.3 % (ref 11.5–15.5)
WBC: 7.5 10*3/uL (ref 4.0–10.5)

## 2013-09-22 LAB — BASIC METABOLIC PANEL WITH GFR
BUN: 15 mg/dL (ref 6–23)
CHLORIDE: 97 meq/L (ref 96–112)
CO2: 32 meq/L (ref 19–32)
Calcium: 9.1 mg/dL (ref 8.4–10.5)
Creat: 0.9 mg/dL (ref 0.50–1.10)
GFR, Est African American: 71 mL/min
GFR, Est Non African American: 62 mL/min
GLUCOSE: 130 mg/dL — AB (ref 70–99)
POTASSIUM: 4.6 meq/L (ref 3.5–5.3)
Sodium: 134 mEq/L — ABNORMAL LOW (ref 135–145)

## 2013-09-22 LAB — SEDIMENTATION RATE: SED RATE: 4 mm/h (ref 0–22)

## 2013-09-22 LAB — HEPATIC FUNCTION PANEL
ALK PHOS: 84 U/L (ref 39–117)
ALT: 18 U/L (ref 0–35)
AST: 19 U/L (ref 0–37)
Albumin: 4.5 g/dL (ref 3.5–5.2)
BILIRUBIN DIRECT: 0.1 mg/dL (ref 0.0–0.3)
Indirect Bilirubin: 0.2 mg/dL (ref 0.2–1.2)
Total Bilirubin: 0.3 mg/dL (ref 0.2–1.2)
Total Protein: 6.8 g/dL (ref 6.0–8.3)

## 2013-09-22 LAB — CK: Total CK: 38 U/L (ref 7–177)

## 2013-09-22 MED ORDER — AZITHROMYCIN 250 MG PO TABS: ORAL_TABLET | ORAL | Status: DC

## 2013-09-22 NOTE — Patient Instructions (Addendum)
I would stop pravastatin for 5 days and then start back on 1/2 pill daily to see if this helps with your muscle cramps.  What is the TMJ? The temporomandibular (tem-PUH-ro-man-DIB-yoo-ler) joint, or the TMJ, connects the upper and lower jawbones. This joint allows the jaw to open wide and move back and forth when you chew, talk, or yawn.There are also several muscles that help this joint move. There can be muscle tightness and pain in the muscle that can cause several symptoms.  What causes TMJ pain? There are many causes of TMJ pain. Repeated chewing (for example, chewing gum) and clenching your teeth can cause pain in the joint. Some TMJ pain has no obvious cause. What can I do to ease the pain? There are many things you can do to help your pain get better. When you have pain:  Eat soft foods and stay away from chewy foods (for example, taffy) Try to use both sides of your mouth to chew Don't chew gum Don't open your mouth wide (for example, during yawning or singing) Don't bite your cheeks or fingernails Lower your amount of stress and worry Applying a warm, damp washcloth to the joint may help. Over-the-counter pain medicines such as ibuprofen (one brand: Advil) or acetaminophen (one brand: Tylenol) might also help. Do not use these medicines if you are allergic to them or if your doctor told you not to use them. How can I stop the pain from coming back? When your pain is better, you can do these exercises to make your muscles stronger and to keep the pain from coming back:  Resisted mouth opening: Place your thumb or two fingers under your chin and open your mouth slowly, pushing up lightly on your chin with your thumb. Hold for three to six seconds. Close your mouth slowly. Resisted mouth closing: Place your thumbs under your chin and your two index fingers on the ridge between your mouth and the bottom of your chin. Push down lightly on your chin as you close your mouth. Tongue up: Slowly  open and close your mouth while keeping the tongue touching the roof of the mouth. Side-to-side jaw movement: Place an object about one fourth of an inch thick (for example, two tongue depressors) between your front teeth. Slowly move your jaw from side to side. Increase the thickness of the object as the exercise becomes easier Forward jaw movement: Place an object about one fourth of an inch thick between your front teeth and move the bottom jaw forward so that the bottom teeth are in front of the top teeth. Increase the thickness of the object as the exercise becomes easier. These exercises should not be painful. If it hurts to do these exercises, stop doing them and talk to your family doctor.   Trigeminal Neuralgia Trigeminal neuralgia is a nerve disorder that causes sudden attacks of severe facial pain. It is caused by damage to the trigeminal nerve, a major nerve in the face. It is more common in women and in the elderly, although it can also happen in younger patients. Attacks last from a few seconds to several minutes and can occur from a couple of times per year to several times per day. Trigeminal neuralgia can be a very distressing and disabling condition. Surgery may be needed in very severe cases if medical treatment does not give relief. HOME CARE INSTRUCTIONS   If your caregiver prescribed medication to help prevent attacks, take as directed.  To help prevent attacks:  Chew on the unaffected side of the mouth.  Avoid touching your face.  Avoid blasts of hot or cold air.  Men may wish to grow a beard to avoid having to shave. SEEK IMMEDIATE MEDICAL CARE IF:  Pain is unbearable and your medicine does not help.  You develop new, unexplained symptoms (problems).  You have problems that may be related to a medication you are taking. Document Released: 06/08/2000 Document Revised: 09/03/2011 Document Reviewed: 04/08/2009 [facility] Patient Information 2014 [place], [place].

## 2013-09-22 NOTE — Progress Notes (Signed)
   Subjective:    Patient ID: Debra White, female    DOB: 23-Feb-1934, 80 y.o.   MRN: [id number]  Sinus Problem This is a new problem. Episode onset: 2-3 days. The problem has been gradually worsening (worse last night) since onset. The pain is moderate. Associated symptoms include congestion, ear pain, headaches and sinus pressure. Pertinent negatives include no chills, coughing, diaphoresis, hoarse voice, neck pain, shortness of breath, sneezing, sore throat or swollen glands. Past treatments include nothing.     Review of Systems  Constitutional: Negative for chills and diaphoresis.  HENT: Positive for congestion, ear pain and sinus pressure. Negative for hoarse voice, sneezing and sore throat.   Respiratory: Negative for cough and shortness of breath.   Musculoskeletal: Negative for neck pain.  Neurological: Positive for headaches.       Objective:   Physical Exam  Constitutional: She is oriented to person, place, and time. She appears well-developed and well-nourished.  HENT:  Head: Normocephalic and atraumatic.  + TMJ pain bilateral  Eyes: Conjunctivae are normal. Pupils are equal, round, and reactive to light.  Neck: Normal range of motion. Neck supple.  SCM tenderness  Cardiovascular: Normal rate and regular rhythm.   Pulmonary/Chest: Effort normal and breath sounds normal.  Abdominal: Soft. Bowel sounds are normal.  Musculoskeletal: Normal range of motion.  Lymphadenopathy:    She has no cervical adenopathy.  Neurological: She is alert and oriented to person, place, and time. No cranial nerve deficit. Coordination normal.  States that her right face has pain, burning along trigeminal nerve.   Skin: Skin is warm and dry.      Assessment & Plan:  ? TMJ vs trigeminal neuralgia s/p trauma- She does not tolerate high dose medications and would appreciate alternative therapy if possible, we will send her to neurologist. She would like an MRI of her head/sinuses but at this  point I do not feel that is warranted with her symptoms. She declines PT at this time but i will give her information about TMJ, encourage soft foods, heat, massage.   Her nasal mucosa is erythematous, red, and swollen, she will try claritin and if it gets worse I have given her a zpak.

## 2013-10-08 ENCOUNTER — Ambulatory Visit: Payer: Medicare Other | Admitting: Neurology

## 2013-10-08 ENCOUNTER — Encounter: Payer: Self-pay | Admitting: Neurology

## 2013-10-08 ENCOUNTER — Ambulatory Visit (INDEPENDENT_AMBULATORY_CARE_PROVIDER_SITE_OTHER): Payer: BC Managed Care – PPO | Admitting: Neurology

## 2013-10-08 VITALS — BP 118/60 | HR 54 | Temp 97.8°F | Ht 64.0 in | Wt 106.9 lb

## 2013-10-08 DIAGNOSIS — G501 Atypical facial pain: Secondary | ICD-10-CM

## 2013-10-08 MED ORDER — LAMOTRIGINE 25 MG PO TABS: 25.0000 mg | ORAL_TABLET | Freq: Every day | ORAL | Status: DC

## 2013-10-08 MED ORDER — LIDOCAINE 5 % EX OINT: 1.0000 "application " | TOPICAL_OINTMENT | Freq: Two times a day (BID) | CUTANEOUS | Status: DC | PRN

## 2013-10-08 NOTE — Progress Notes (Signed)
NEUROLOGY CONSULTATION NOTE  Debra Debra White MRN: [id number] DOB: Nov 07, 1933  Referring provider: Dr. Unk Pinto Primary care provider:  Dr. Unk Pinto  Reason for consult:  Right facial pain  Dear Dr [name]:  Thank you for your kind referral of Debra Debra White for consultation of the above symptoms. Although her history is well known to you, please allow me to reiterate it for the purpose of our medical record. The patient was accompanied to the clinic by her husband who also provides collateral information. Records and images were personally reviewed where available.   HISTORY OF PRESENT ILLNESS: This is a pleasant 80 year old right-handed woman who had a fall in January 2013 sustaining a right depressed zygoma fracture.  She underwent open reduction internal fixation of right tripod fracture, orbital floor exploration, and temporary tarsorrhaphy.  She had right facial numbness 9-10 months after, then started having excruciating pain.  This has improved except for a pain over the right upper lip and gum region that can become so severe that she can hardly eat.  Sometimes the pain shoots and radiates throughout her head.  She describes the pain as a burning pain, sometimes there is a "knot" that comes out feeling like a thorn.  She has been taking gabapentin 100mg  3-4 times a day, and reports that she cannot take stronger medicine but has not tried higher doses of this.  She reports significant anxiety and takes clonazepam daily.    She has tearing in both eyes and has been told she has dry eye.  She reports ear pain bilaterally at times.  She has insomnia, reporting that she has not slept well since she fell.  Clonazepam helps her sleep.  She has a pinched nerve in her back and uses a heating pad.  No bowel/bladder dysfunction.  Laboratory Data: Lab Results  Component Value Date   WBC 7.5 09/22/2013   HGB 12.8 09/22/2013   HCT 37.2 09/22/2013   MCV 88.4 09/22/2013   PLT 236  09/22/2013     Chemistry      Component Value Date/Time   NA 134* 09/22/2013 1547   K 4.6 09/22/2013 1547   CL 97 09/22/2013 1547   CO2 32 09/22/2013 1547   BUN 15 09/22/2013 1547   CREATININE 0.90 09/22/2013 1547   CREATININE 0.58 03/25/2013 2200      Component Value Date/Time   CALCIUM 9.1 09/22/2013 1547   ALKPHOS 84 09/22/2013 1547   AST 19 09/22/2013 1547   ALT 18 09/22/2013 1547   BILITOT 0.3 09/22/2013 1547      PAST MEDICAL HISTORY: Past Medical History  Diagnosis Date  . Fibromyalgia   . Anxiety   . Dysrhythmia     RX  . Blood transfusion   . Chronic kidney disease     STONES  . Headache(784.0)   . Arthritis   . Cataract   . Diverticulosis of colon (without mention of hemorrhage) 2007    Colonoscopy  . Candida esophagitis 2013    EGD  . Family history of colon cancer     sister  . Hypertension   . Hyperlipemia   . GERD (gastroesophageal reflux disease)   . Emphysema of lung     PAST SURGICAL HISTORY: Past Surgical History  Procedure Laterality Date  . Abdominal hysterectomy    . Hand surgery      BIL   . Shoulder arthroscopy w/ rotator cuff repair      LFT  . Cervical  disc surgery    . Back surgery      X2  . Orif tripod fracture  07/13/2011    Procedure: OPEN REDUCTION INTERNAL FIXATION (ORIF) TRIPOD FRACTURE;  Surgeon: [name], MD;  Location: [facility] OR;  Service: ENT;  Laterality: Right;  ORIF RIGHT ZYGOMA, ORBITAL FLOOR EXPLORATION WITH FROST STITCH (TEMPORARY TARSORRHAPHY)  . Colonoscopy    . Facial fracture surgery    . Back surgery      MEDICATIONS: Current Outpatient Prescriptions on File Prior to Visit  Medication Sig Dispense Refill  . atenolol (TENORMIN) 100 MG tablet Take 1 tablet (100 mg total) by mouth every morning.  90 tablet  1  . azithromycin (ZITHROMAX) 250 MG tablet 2 tablets by mouth today then one tablet daily for 4 days.  6 tablet  1  . cholecalciferol (VITAMIN D) 1000 UNITS tablet Take 2,000 Units by mouth daily.      .  clonazePAM (KLONOPIN) 0.5 MG tablet Take 1 tablet (0.5 mg total) by mouth 3 (three) times daily as needed for anxiety.  90 tablet  1  . esomeprazole (NEXIUM) 40 MG capsule Take 1 capsule (40 mg total) by mouth daily.  30 capsule  99  . gabapentin (NEURONTIN) 100 MG capsule Take 2 to 3  tabs at HS for facial pain  180 capsule  99  . HYDROcodone-acetaminophen (NORCO/VICODIN) 5-325 MG per tablet Take 2 tablets by mouth every 4 (four) hours as needed for pain.  10 tablet  0  . hyoscyamine (LEVSIN SL) 0.125 MG SL tablet Place 0.125 mg under the tongue every 4 (four) hours as needed for cramping (nausea).      . losartan (COZAAR) 100 MG tablet Take 50 mg by mouth daily.       . ondansetron (ZOFRAN) 4 MG tablet Take 1 tablet (4 mg total) by mouth every 8 (eight) hours as needed for nausea or vomiting.  30 tablet  4  . pravastatin (PRAVACHOL) 40 MG tablet Take 40 mg by mouth daily.      . Probiotic Product (FLORAJEN3 PO) Take by mouth daily.      . Simethicone (GAS RELIEF PO) Take by mouth daily. OTC      . sucralfate (CARAFATE) 1 GM/10ML suspension Take 10 mLs (1 g total) by mouth 3 (three) times daily with meals as needed.  420 mL  1  . [DISCONTINUED] omeprazole (PRILOSEC OTC) 20 MG tablet Take 20 mg by mouth daily.       No current facility-administered medications on file prior to visit.    ALLERGIES: Allergies  Allergen Reactions  . Latex Hives    TONGUE HAD BLISTERS WHEN HAD DENTAL SURGERY  . Amoxicillin Nausea And Vomiting  . Chlorhexidine Gluconate Itching  . Aspirin     Gi upset  . Barbiturates   . Celebrex [Celecoxib]   . Codeine Itching  . Evista [Raloxifene]   . Lipitor [Atorvastatin]   . Morphine And Related   . Nsaids   . Oruvail [Ketoprofen]   . Paraffin   . Prochlorperazine Other (See Comments)    Uncontrolled shaking  . Proloprim [Trimethoprim]   . Vibra-Tab [Doxycycline]   . Vioxx [Rofecoxib]     edema  . Betadine [Povidone Iodine] Rash  . Caffeine Palpitations     FAMILY HISTORY: Family History  Problem Relation Age of Onset  . Heart disease Mother   . Breast cancer Sister   . Colon cancer Sister   . Cancer Sister  breast  . Cancer Sister     colon  . Cancer Sister     colon  . Heart disease Father   . Hypertension Daughter   . Hyperlipidemia Daughter     SOCIAL HISTORY: History   Social History  . Marital Status: Married    Spouse Name: N/A    Number of Children: 1  . Years of Education: N/A   Occupational History  . Retired    Social History Main Topics  . Smoking status: Never Smoker   . Smokeless tobacco: Never Used  . Alcohol Use: No  . Drug Use: No  . Sexual Activity: Not on file     Comment: HYSTER   Other Topics Concern  . Not on file   Social History Narrative   [place]   Husband is next of kin    REVIEW OF SYSTEMS: Constitutional: No fevers, chills, or sweats, no generalized fatigue, change in appetite Eyes: No visual changes, double vision, eye pain Ear, nose and throat: + hearing loss, ear pain, no nasal congestion, sore throat Cardiovascular: No chest pain, palpitations Respiratory:  No shortness of breath at rest or with exertion, wheezes GastrointestinaI: No nausea, vomiting, diarrhea, abdominal pain, fecal incontinence Genitourinary:  No dysuria, urinary retention or frequency Musculoskeletal:  + neck pain, back pain Integumentary: No rash, pruritus, skin lesions Neurological: as above Psychiatric: + depression, insomnia, anxiety Endocrine: No palpitations, fatigue, diaphoresis, mood swings, change in appetite, change in weight, increased thirst Hematologic/Lymphatic:  No anemia, purpura, petechiae. Allergic/Immunologic: no itchy/runny eyes, nasal congestion, recent allergic reactions, rashes  PHYSICAL EXAM: Filed Vitals:   10/08/13 1027  BP: 118/60  Pulse: 54  Temp: 97.8 F (36.6 C)   General: No acute distress Head:  Normocephalic/atraumatic Neck:  supple, no paraspinal tenderness, full range of motion Back: No paraspinal tenderness Heart: regular rate and rhythm Lungs: Clear to auscultation bilaterally. Vascular: No carotid bruits. Skin/Extremities: No rash, no edema Neurological Exam: Mental status: alert and oriented to person, place, and time, no dysarthria or aphasia, Fund of knowledge is appropriate.  Recent and remote memory are intact.  Attention and concentration are normal.    Able to name objects and repeat phrases. Cranial nerves: CN I: not tested CN II: pupils equal, round and reactive to light, visual fields intact, fundi unremarkable. CN III, IV, VI:  full range of motion, no nystagmus, no ptosis CN V: facial sensation intact, corneal reflexes intact CN VII: upper and lower face symmetric CN VIII: hearing intact CN IX, X: gag intact, uvula midline CN XI: sternocleidomastoid and trapezius muscles intact CN XII: tongue midline Bulk & Tone: normal, no fasciculations. Motor: 5/5 throughout with no pronator drift. Sensation: intact to light touch, cold, pin, vibration and joint position sense.  No extinction to double simultaneous stimulation.  Romberg test negative Deep Tendon Reflexes: +2 throughout, no ankle clonus Plantar responses: downgoing bilaterally Cerebellar: no incoordination on finger to nose, heel to shin. No dysdiadochokinesia Gait: narrow-based and steady, able to tandem walk adequately. Tremor: none  IMPRESSION: This is a 80 year old right-handed woman with a history of right zygomatic fracture status post ORIF who has had excruciating pain post-op (atypical facial pain).  She has had good response to low dose gabapentin and is concerned about increasing dose.  We discussed options to slowly increase dose, or add low dose lamotrigine.  She opted to start low dose lamictal.  She became tearful during the visit and expressed depression with  her condition.  Lamictal may help with mood as well.  Side effects  of Lamictal, including Kathreen Cosier syndrome were discussed.  She asked for a topical agent for the right lip pain and will try lidocaine ointment in the affected region.  She will follow-up in 1 month.  Thank you for allowing me to participate in the care of this patient. Please do not hesitate to call for any questions or concerns.   Ellouise Newer, M.D.

## 2013-10-08 NOTE — Patient Instructions (Signed)
1. Start Lamotrigine 25mg  once a day 2. Continue current dose of Gabapentin 3. Start lidocaine ointment on affected area 1-2 times a day 4. Follow-up in 1 month 5. Call our office for any problems

## 2013-10-21 ENCOUNTER — Encounter: Payer: Self-pay | Admitting: Physician Assistant

## 2013-10-21 ENCOUNTER — Ambulatory Visit (INDEPENDENT_AMBULATORY_CARE_PROVIDER_SITE_OTHER): Payer: Medicare Other | Admitting: Physician Assistant

## 2013-10-21 VITALS — BP 134/78 | HR 56 | Temp 98.4°F | Resp 16 | Wt 105.2 lb

## 2013-10-21 DIAGNOSIS — R7303 Prediabetes: Secondary | ICD-10-CM

## 2013-10-21 DIAGNOSIS — E785 Hyperlipidemia, unspecified: Secondary | ICD-10-CM

## 2013-10-21 DIAGNOSIS — E782 Mixed hyperlipidemia: Secondary | ICD-10-CM

## 2013-10-21 DIAGNOSIS — Z79899 Other long term (current) drug therapy: Secondary | ICD-10-CM

## 2013-10-21 DIAGNOSIS — G44329 Chronic post-traumatic headache, not intractable: Secondary | ICD-10-CM

## 2013-10-21 DIAGNOSIS — I10 Essential (primary) hypertension: Secondary | ICD-10-CM

## 2013-10-21 DIAGNOSIS — Z8719 Personal history of other diseases of the digestive system: Secondary | ICD-10-CM

## 2013-10-21 DIAGNOSIS — Z0000 Encounter for general adult medical examination without abnormal findings: Secondary | ICD-10-CM

## 2013-10-21 DIAGNOSIS — E559 Vitamin D deficiency, unspecified: Secondary | ICD-10-CM

## 2013-10-21 DIAGNOSIS — E2839 Other primary ovarian failure: Secondary | ICD-10-CM

## 2013-10-21 LAB — BASIC METABOLIC PANEL WITH GFR
BUN: 16 mg/dL (ref 6–23)
CO2: 29 mEq/L (ref 19–32)
Calcium: 9.3 mg/dL (ref 8.4–10.5)
Chloride: 97 mEq/L (ref 96–112)
Creat: 0.86 mg/dL (ref 0.50–1.10)
GFR, EST AFRICAN AMERICAN: 75 mL/min
GFR, Est Non African American: 65 mL/min
GLUCOSE: 91 mg/dL (ref 70–99)
Potassium: 4.7 mEq/L (ref 3.5–5.3)
Sodium: 134 mEq/L — ABNORMAL LOW (ref 135–145)

## 2013-10-21 LAB — HEPATIC FUNCTION PANEL
ALK PHOS: 77 U/L (ref 39–117)
ALT: 19 U/L (ref 0–35)
AST: 21 U/L (ref 0–37)
Albumin: 4.6 g/dL (ref 3.5–5.2)
Bilirubin, Direct: 0.1 mg/dL (ref 0.0–0.3)
Indirect Bilirubin: 0.5 mg/dL (ref 0.2–1.2)
TOTAL PROTEIN: 7.2 g/dL (ref 6.0–8.3)
Total Bilirubin: 0.6 mg/dL (ref 0.2–1.2)

## 2013-10-21 LAB — LIPID PANEL
Cholesterol: 166 mg/dL (ref 0–200)
HDL: 73 mg/dL (ref >39)
LDL Cholesterol: 70 mg/dL (ref 0–99)
TRIGLYCERIDES: 113 mg/dL (ref <150)
Total CHOL/HDL Ratio: 2.3 Ratio
VLDL: 23 mg/dL (ref 0–40)

## 2013-10-21 LAB — CBC WITH DIFFERENTIAL/PLATELET
Basophils Absolute: 0.1 10*3/uL (ref 0.0–0.1)
Basophils Relative: 1 % (ref 0–1)
Eosinophils Absolute: 0.2 10*3/uL (ref 0.0–0.7)
Eosinophils Relative: 3 % (ref 0–5)
HCT: 37.1 % (ref 36.0–46.0)
HEMOGLOBIN: 12.5 g/dL (ref 12.0–15.0)
Lymphocytes Relative: 27 % (ref 12–46)
Lymphs Abs: 1.7 10*3/uL (ref 0.7–4.0)
MCH: 30.5 pg (ref 26.0–34.0)
MCHC: 33.7 g/dL (ref 30.0–36.0)
MCV: 90.5 fL (ref 78.0–100.0)
MONO ABS: 0.6 10*3/uL (ref 0.1–1.0)
Monocytes Relative: 10 % (ref 3–12)
NEUTROS ABS: 3.8 10*3/uL (ref 1.7–7.7)
Neutrophils Relative %: 59 % (ref 43–77)
Platelets: 228 10*3/uL (ref 150–400)
RBC: 4.1 MIL/uL (ref 3.87–5.11)
RDW: 14.1 % (ref 11.5–15.5)
WBC: 6.4 10*3/uL (ref 4.0–10.5)

## 2013-10-21 LAB — HEMOGLOBIN A1C
Hgb A1c MFr Bld: 6.1 % — ABNORMAL HIGH (ref <5.7)
MEAN PLASMA GLUCOSE: 128 mg/dL — AB (ref <117)

## 2013-10-21 LAB — MAGNESIUM: Magnesium: 2 mg/dL (ref 1.5–2.5)

## 2013-10-21 LAB — TSH: TSH: 0.738 u[IU]/mL (ref 0.350–4.500)

## 2013-10-21 NOTE — Patient Instructions (Signed)

## 2013-10-21 NOTE — Progress Notes (Signed)
Subjective:   Debra White is a 80 y.o. female who presents for Medicare Annual Wellness Visit and 3 month follow up on hypertension, prediabetes, hyperlipidemia, vitamin D def.  Date of last medicare wellness visit is unknown.   Her blood pressure has been controlled at home, today their BP is BP: 134/78 mmHg She does not workout but she does stay busy. She denies chest pain, shortness of breath, dizziness.  She is not on cholesterol medication and denies myalgias. Her cholesterol is at goal. The cholesterol last visit was:   Lab Results  Component Value Date   CHOL 147 07/08/2013   HDL 72 07/08/2013   LDLCALC 53 07/08/2013   TRIG 112 07/08/2013   CHOLHDL 2.0 07/08/2013   She has been working on diet and exercise for prediabetes, and denies paresthesia of the feet, polydipsia and polyuria. Last A1C in the office was:  Lab Results  Component Value Date   HGBA1C 5.8* 07/08/2013   Patient is on Vitamin D supplement. She recently saw Dr. Delice Lesch, neuro, for her facial pain/neuralgia and was put on Lamictal and a topical lidocaine which she states is helping in combination with the Klonopin.  She gets occ dizziness but has not fallen.  She saw Dr. Sharol Given for her right shoulder and got an injection but has not helped. She is going to follow up with her neurologist.   Names of Other Physician/Practitioners you currently use: 1. [facility]- here for primary care 2. Dr. Vedia Pereyra, eye doctor, last visit Jan 2014 3. Dr. Lilian Coma not recall , dentist, last visit q 6 months Patient Care Team: Unk Pinto, MD as PCP - General (Internal Medicine)  Medication Review Current Outpatient Prescriptions on File Prior to Visit  Medication Sig Dispense Refill  . atenolol (TENORMIN) 100 MG tablet Take 1 tablet (100 mg total) by mouth every morning.  90 tablet  1  . azithromycin (ZITHROMAX) 250 MG tablet 2 tablets by mouth today then one tablet daily for 4 days.  6  tablet  1  . cholecalciferol (VITAMIN D) 1000 UNITS tablet Take 2,000 Units by mouth daily.      . clonazePAM (KLONOPIN) 0.5 MG tablet Take 1 tablet (0.5 mg total) by mouth 3 (three) times daily as needed for anxiety.  90 tablet  1  . esomeprazole (NEXIUM) 40 MG capsule Take 1 capsule (40 mg total) by mouth daily.  30 capsule  99  . gabapentin (NEURONTIN) 100 MG capsule Take 2 to 3  tabs at HS for facial pain  180 capsule  99  . HYDROcodone-acetaminophen (NORCO/VICODIN) 5-325 MG per tablet Take 2 tablets by mouth every 4 (four) hours as needed for pain.  10 tablet  0  . hyoscyamine (LEVSIN SL) 0.125 MG SL tablet Place 0.125 mg under the tongue every 4 (four) hours as needed for cramping (nausea).      . lamoTRIgine (LAMICTAL) 25 MG tablet Take 1 tablet (25 mg total) by mouth daily.  30 tablet  3  . lidocaine (XYLOCAINE) 5 % ointment Apply 1 application topically 2 (two) times daily as needed.  35.44 g  3  . losartan (COZAAR) 100 MG tablet Take 50 mg by mouth daily.       . ondansetron (ZOFRAN) 4 MG tablet Take 1 tablet (4 mg total) by mouth every 8 (eight) hours as needed for nausea or vomiting.  30 tablet  4  . pravastatin (PRAVACHOL) 40 MG tablet Take 40 mg by mouth  daily.      . Probiotic Product (FLORAJEN3 PO) Take by mouth daily.      . Simethicone (GAS RELIEF PO) Take by mouth daily. OTC      . sucralfate (CARAFATE) 1 GM/10ML suspension Take 10 mLs (1 g total) by mouth 3 (three) times daily with meals as needed.  420 mL  1  . [DISCONTINUED] omeprazole (PRILOSEC OTC) 20 MG tablet Take 20 mg by mouth daily.       No current facility-administered medications on file prior to visit.    Current Problems (verified) Patient Active Problem List   Diagnosis Date Noted  . 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 Routine general medical examination at a health care facility 07/08/2013  . Unspecified essential hypertension 07/08/2013  . Mixed hyperlipidemia 07/08/2013  . Unspecified vitamin D  deficiency 07/08/2013  . Hypertension   . Hyperlipemia   . GERD (gastroesophageal reflux disease)   . Emphysema of lung   . Infected sebaceous cyst - right groin 07/16/2012  . Nausea 09/20/2011  . Weight loss due to medication 09/20/2011  . Hx of gastroesophageal reflux (GERD) 09/20/2011  . Headache, post-traumatic, chronic 09/20/2011  . Fracture, zygoma closed 07/13/2011    Screening Tests Health Maintenance  Topic Date Due  . Zostavax  12/30/1995  . Tetanus/tdap  06/25/2013  . Influenza Vaccine  01/23/2014  . Colonoscopy  03/17/2017  . Pneumococcal Polysaccharide Vaccine Age 74 And Over  Completed     Immunization History  Administered Date(s) Administered  . Influenza-Unspecified 03/09/2013  . Pneumococcal Polysaccharide-23 06/25/2002  . Td 06/26/2003    Preventative care: Last colonoscopy: 2013 due 2018 Last mammogram: 06/2013 Last pap smear/pelvic exam: remote DEXA: 2013 DUE  Prior vaccinations: TD or Tdap: 2005 would prefer to wait- states she thinks it is allergic  Influenza: 2014 Pneumococcal: 2004 Shingles/Zostavax: declines due to cost  History reviewed: allergies, current medications, past family history, past medical history, past social history, past surgical history and problem list  Risk Factors: Osteoporosis: postmenopausal estrogen deficiency and dietary calcium and/or vitamin D deficiency History of fracture in the past year: no  Tobacco History  Substance Use Topics  . Smoking status: Never Smoker   . Smokeless tobacco: Never Used  . Alcohol Use: No   She does not smoke.  Patient is not a former smoker. Are there smokers in your home (other than you)?  No  Alcohol Current alcohol use: none  Caffeine Current caffeine use: denies use  Exercise Exercise limitations: The patient is experiencing exercise intolerance (due to pain). Current exercise: none  Nutrition/Diet Current diet: in general, a "healthy" diet    Cardiac risk  factors: advanced age (older than 25 for men, 72 for women), dyslipidemia, hypertension and sedentary lifestyle.  Depression Screen (Note: if answer to either of the following is "Yes", a more complete depression screening is indicated)   Q1: Over the past two weeks, have you felt down, depressed or hopeless? No  Q2: Over the past two weeks, have you felt little interest or pleasure in doing things? No  Have you lost interest or pleasure in daily life? No  Do you often feel hopeless? No  Do you cry easily over simple problems? No  Activities of Daily Living In your present state of health, do you have any difficulty performing the following activities?:  Driving? No Managing money?  No Feeding yourself? No Getting from bed to chair? No Climbing a flight of stairs? No Preparing food and eating?: No  Bathing or showering? No Getting dressed: No Getting to the toilet? No Using the toilet:No Moving around from place to place: No In the past year have you fallen or had a near fall?:No   Are you sexually active?  No  Do you have more than one partner?  No  Vision Difficulties: No  Hearing Difficulties: No Do you often ask people to speak up or repeat themselves? No Do you experience ringing or noises in your ears? No Do you have difficulty understanding soft or whispered voices? No  Cognition  Do you feel that you have a problem with memory?Yes  Do you often misplace items? No  Do you feel safe at home?  Yes  Advanced directives Does patient have a [place]? Yes Does patient have a Living Will? Yes   Objective:   Blood pressure 134/78, pulse 56, temperature 98.4 F (36.9 C), resp. rate 16, weight 105 lb 3.2 oz (47.718 kg). Body mass index is 18.05 kg/(m^2).  General appearance: alert, no distress, WD/WN,  female Cognitive Testing  Alert? Yes  Normal Appearance?Yes  Oriented to person? Yes  Place? Yes   Time? Yes  Recall of three objects?   Yes  Can perform simple calculations? Yes  Displays appropriate judgment?Yes  Can read the correct time from a watch face?Yes  HEENT: normocephalic, sclerae anicteric, TMs pearly, nares patent, no discharge or erythema, pharynx normal Oral cavity: MMM, no lesions Neck: supple, no lymphadenopathy, no thyromegaly, no masses Heart: RRR, normal S1, S2, no murmurs Lungs: CTA bilaterally, no wheezes, rhonchi, or rales Abdomen: +bs, soft, non tender, non distended, no masses, no hepatomegaly, no splenomegaly Musculoskeletal: nontender, no swelling, no obvious deformity Extremities: no edema, no cyanosis, no clubbing Pulses: 2+ symmetric, upper and lower extremities, normal cap refill Neurological: alert, oriented x 3, CN2-12 intact, strength normal upper extremities and lower extremities, sensation normal throughout, DTRs 2+ throughout, no cerebellar signs, gait normal Psychiatric: normal affect, behavior normal, pleasant  Breast: defer Gyn: defer Rectal: defer   Assessment:   1. Hypertension - CBC with Differential - BASIC METABOLIC PANEL WITH GFR - Hepatic function panel - TSH  2. Hx of gastroesophageal reflux (GERD)  3. Headache, post-traumatic, chronic Seems to be doing better, continue follow up with Neurologist, monitor closely for polypharmacy and falls.   4. Hyperlipemia - Lipid panel  5. Unspecified vitamin D deficiency   6. Prediabetes Discussed general issues about diabetes pathophysiology and management., Educational material distributed., Suggested low cholesterol diet., Encouraged aerobic exercise., Discussed foot care., Reminded to get yearly retinal exam. - Hemoglobin A1c - HM DIABETES FOOT EXAM  7. Encounter for long-term (current) use of other medications - Magnesium  8. Estrogen deficiency - DG Bone Density; Future   Plan:   During the course of the visit the patient was educated and counseled about appropriate screening and preventive services  including:    Pneumococcal vaccine   Influenza vaccine  Td vaccine  Screening electrocardiogram  Screening mammography  Bone densitometry screening  Colorectal cancer screening  Diabetes screening  Glaucoma screening  Nutrition counseling   Screening recommendations, referrals:  Vaccinations: Tdap vaccine declined Influenza vaccine not indicated Pneumococcal vaccine not indicated Shingles vaccine declined Hep B vaccine not indicated  Nutrition assessed and recommended  Colonoscopy declined Mammogram requested Pap smear not indicated Pelvic exam not indicated Recommended yearly ophthalmology/optometry visit for glaucoma screening and checkup Recommended yearly dental visit for hygiene and checkup Advanced directives - requested  Conditions/risks identified: BMI:  Discussed weight loss, diet, and increase physical activity.  Increase physical activity: AHA recommends 150 minutes of physical activity a week.  Medications reviewed DEXA- ordered Diabetes is at goal, ACE/ARB therapy: Yes. Urinary Incontinence is not an issue: discussed non pharmacology and pharmacology options.  Fall risk: high- discussed PT, home fall assessment, medications.   Medicare Attestation I have personally reviewed: The patient's medical and social history Their use of alcohol, tobacco or illicit drugs Their current medications and supplements The patient's functional ability including ADLs,fall risks, home safety risks, cognitive, and hearing and visual impairment Diet and physical activities Evidence for depression or mood disorders  The patient's weight, height, BMI, and visual acuity have been recorded in the chart.  I have made referrals, counseling, and provided education to the patient based on review of the above and I have provided the patient with a written personalized care plan for preventive services.     Vicie Mutters, PA-C   10/21/2013

## 2013-10-23 ENCOUNTER — Other Ambulatory Visit: Payer: Self-pay | Admitting: Physician Assistant

## 2013-10-23 DIAGNOSIS — F411 Generalized anxiety disorder: Secondary | ICD-10-CM

## 2013-10-23 MED ORDER — CLONAZEPAM 0.5 MG PO TABS: 0.5000 mg | ORAL_TABLET | Freq: Three times a day (TID) | ORAL | Status: DC | PRN

## 2013-11-05 ENCOUNTER — Ambulatory Visit (INDEPENDENT_AMBULATORY_CARE_PROVIDER_SITE_OTHER): Payer: BC Managed Care – PPO | Admitting: Neurology

## 2013-11-05 ENCOUNTER — Encounter: Payer: Self-pay | Admitting: Neurology

## 2013-11-05 VITALS — BP 110/60 | HR 55 | Temp 98.0°F | Ht 64.0 in | Wt 105.3 lb

## 2013-11-05 DIAGNOSIS — G501 Atypical facial pain: Secondary | ICD-10-CM

## 2013-11-05 MED ORDER — LAMOTRIGINE 25 MG PO TABS: 25.0000 mg | ORAL_TABLET | Freq: Every day | ORAL | Status: DC

## 2013-11-05 MED ORDER — GABAPENTIN 100 MG PO CAPS
ORAL_CAPSULE | ORAL | Status: DC
Start: 2013-11-05 — End: 2014-09-02

## 2013-11-05 NOTE — Patient Instructions (Signed)
1. Increase gabapentin 100mg : Take 1 cap in AM, 1 cap at 2pm, 2 caps at bedtime for 1 month, then increase ito 2 caps in AM, 1 cap at 2pm, 2 caps at bedtime 2. Continue Lamictal 25mg  daily 3. Continue lidocaine ointment to affected area

## 2013-11-05 NOTE — Progress Notes (Signed)
NEUROLOGY FOLLOW UP OFFICE NOTE  Debra White [id number]  HISTORY OF PRESENT ILLNESS: I had the pleasure of seeing Debra White in follow-up in the neurology clinic on 11/05/2013.  The patient was last seen last month for atypical facial pain since right depressed zygoma fracture in January 2013.  She was hesitant to increase gabapentin at that time and opted for addition of a second low dose agent.  Lamictal was added, she is currently on 25mg  daily, in addition to gabapentin 100mg  TID, reporting that this combination, in addition to clonazepam, has helped significantly with the pain around her right eye and with the shooting pain. The numbness is better, and she notices this worsening when she is upset.  She was mostly bothered by pain over the right upper lip and asked for a topical agent.  She tried topical lidocaine, which does help, however it becomes so numb sometimes that she has bitten her lip when eating.  She mostly uses this at night.    She denies any side effects on current medications, although she is concerned about abdominal bloating. She denies any abdominal pain, stating it is "just a lot of gas" which an over the counter medicine helps with.  She has right shoulder pain despite injections last month. No new symptoms reported today.  She became tearful at the end of the visit, reporting that sometimes she does not want to get out of bed.  HPI: This is a pleasant 80 yo RH woman who had a fall in January 2013 sustaining a right depressed zygoma fracture. She underwent open reduction internal fixation of right tripod fracture, orbital floor exploration, and temporary tarsorrhaphy. She had right facial numbness 9-10 months after, then started having excruciating pain. This has improved except for a pain over the right upper lip and gum region that can become so severe that she can hardly eat. Sometimes the pain shoots and radiates throughout her head. She describes the pain as a burning  pain, sometimes there is a "knot" that comes out feeling like a thorn. She has been taking gabapentin 100mg  3-4 times a day, and reports that she cannot take stronger medicine but has not tried higher doses of this. She reports significant anxiety and takes clonazepam daily.   PAST MEDICAL HISTORY: Past Medical History  Diagnosis Date  . Fibromyalgia   . Anxiety   . Dysrhythmia     RX  . Blood transfusion   . Chronic kidney disease     STONES  . Headache(784.0)   . Arthritis   . Cataract   . Diverticulosis of colon (without mention of hemorrhage) 2007    Colonoscopy  . Candida esophagitis 2013    EGD  . Family history of colon cancer     sister  . Hypertension   . Hyperlipemia   . GERD (gastroesophageal reflux disease)   . Emphysema of lung     MEDICATIONS: Current Outpatient Prescriptions on File Prior to Visit  Medication Sig Dispense Refill  . atenolol (TENORMIN) 100 MG tablet Take 1 tablet (100 mg total) by mouth every morning.  90 tablet  1  . azithromycin (ZITHROMAX) 250 MG tablet 2 tablets by mouth today then one tablet daily for 4 days.  6 tablet  1  . cholecalciferol (VITAMIN D) 1000 UNITS tablet Take 2,000 Units by mouth daily.      . clonazePAM (KLONOPIN) 0.5 MG tablet Take 1 tablet (0.5 mg total) by mouth 3 (three) times daily as needed for  anxiety.  90 tablet  1  . esomeprazole (NEXIUM) 40 MG capsule Take 1 capsule (40 mg total) by mouth daily.  30 capsule  99  . HYDROcodone-acetaminophen (NORCO/VICODIN) 5-325 MG per tablet Take 2 tablets by mouth every 4 (four) hours as needed for pain.  10 tablet  0  . hyoscyamine (LEVSIN SL) 0.125 MG SL tablet Place 0.125 mg under the tongue every 4 (four) hours as needed for cramping (nausea).      . lidocaine (XYLOCAINE) 5 % ointment Apply 1 application topically 2 (two) times daily as needed.  35.44 g  3  . losartan (COZAAR) 100 MG tablet Take 50 mg by mouth daily.       . ondansetron (ZOFRAN) 4 MG tablet Take 1 tablet (4 mg  total) by mouth every 8 (eight) hours as needed for nausea or vomiting.  30 tablet  4  . pravastatin (PRAVACHOL) 40 MG tablet Take 40 mg by mouth daily.      . Probiotic Product (FLORAJEN3 PO) Take by mouth daily.      . Simethicone (GAS RELIEF PO) Take by mouth daily. OTC      . sucralfate (CARAFATE) 1 GM/10ML suspension Take 10 mLs (1 g total) by mouth 3 (three) times daily with meals as needed.  420 mL  1  . [DISCONTINUED] omeprazole (PRILOSEC OTC) 20 MG tablet Take 20 mg by mouth daily.       No current facility-administered medications on file prior to visit.    ALLERGIES: Allergies  Allergen Reactions  . Latex Hives    TONGUE HAD BLISTERS WHEN HAD DENTAL SURGERY  . Amoxicillin Nausea And Vomiting  . Chlorhexidine Gluconate Itching  . Aspirin     Gi upset  . Barbiturates   . Celebrex [Celecoxib]   . Codeine Itching  . Evista [Raloxifene]   . Lipitor [Atorvastatin]   . Morphine And Related   . Nsaids   . Oruvail [Ketoprofen]   . Paraffin   . Prochlorperazine Other (See Comments)    Uncontrolled shaking  . Proloprim [Trimethoprim]   . Vibra-Tab [Doxycycline]   . Vioxx [Rofecoxib]     edema  . Betadine [Povidone Iodine] Rash  . Caffeine Palpitations    FAMILY HISTORY: Family History  Problem Relation Age of Onset  . Heart disease Mother   . Breast cancer Sister   . Colon cancer Sister   . Cancer Sister     breast  . Cancer Sister     colon  . Cancer Sister     colon  . Heart disease Father   . Hypertension Daughter   . Hyperlipidemia Daughter     SOCIAL HISTORY: History   Social History  . Marital Status: Married    Spouse Name: N/A    Number of Children: 1  . Years of Education: N/A   Occupational History  . Retired    Social History Main Topics  . Smoking status: Never Smoker   . Smokeless tobacco: Never Used  . Alcohol Use: No  . Drug Use: No  . Sexual Activity: Not on file     Comment: HYSTER   Other Topics Concern  . Not on file    Social History Narrative   [place]   Husband is next of kin    REVIEW OF SYSTEMS: Constitutional: No fevers, chills, or sweats, no generalized fatigue, change in appetite Eyes: No visual changes, double vision, eye pain Ear, nose and throat: No  hearing loss, ear pain, nasal congestion, sore throat Cardiovascular: No chest pain, palpitations Respiratory:  No shortness of breath at rest or with exertion, wheezes GastrointestinaI: No nausea, vomiting, diarrhea, abdominal pain, fecal incontinence Genitourinary:  No dysuria, urinary retention or frequency Musculoskeletal:  No neck pain, back pain, +right shoulder pain Integumentary: No rash, pruritus, skin lesions Neurological: as above Psychiatric: No depression, insomnia, anxiety Endocrine: No palpitations, fatigue, diaphoresis, mood swings, change in appetite, change in weight, increased thirst Hematologic/Lymphatic:  No anemia, purpura, petechiae. Allergic/Immunologic: no itchy/runny eyes, nasal congestion, recent allergic reactions, rashes  PHYSICAL EXAM: Filed Vitals:   11/05/13 0955  BP: 110/60  Pulse: 55  Temp: 98 F (36.7 C)   General: No acute distress Head:  Normocephalic/atraumatic Neck: supple, no paraspinal tenderness, full range of motion Heart:  Regular rate and rhythm Lungs:  Clear to auscultation bilaterally Back: No paraspinal tenderness Skin/Extremities: No rash, no edema Neurological Exam:  Mental status: alert and oriented to person, place, and time, no dysarthria or aphasia, Fund of knowledge is appropriate. Recent and remote memory are intact. Attention and concentration are normal. Able to name objects and repeat phrases.  Cranial nerves:  CN I: not tested  CN II: pupils equal, round and reactive to light, visual fields intact, fundi unremarkable.  CN III, IV, VI: full range of motion, no nystagmus, no ptosis  CN V: facial sensation intact, corneal reflexes intact  CN  VII: upper and lower face symmetric  CN VIII: hearing intact  CN IX, X: gag intact, uvula midline  CN XI: sternocleidomastoid and trapezius muscles intact  CN XII: tongue midline  Bulk & Tone: normal, no fasciculations.  Motor: 5/5 throughout with no pronator drift.  Sensation: intact to light touch. No extinction to double simultaneous stimulation. Romberg test negative  Deep Tendon Reflexes: +2 throughout, no ankle clonus  Plantar responses: downgoing bilaterally  Cerebellar: no incoordination on finger to nose, heel to shin. No dysdiadochokinesia  Gait: narrow-based and steady, able to tandem walk adequately.  Tremor: none   IMPRESSION:  This is a 80 yo RH woman with a history of right zygomatic fracture status post ORIF who has had excruciating pain post-op (atypical facial pain). She continues to report better response to addition of low dose Lamictal with no side effects.  We discussed increasing dose of Lamictal, on the prior visit she did not want to increase gabapentin, however today she requests that we increase the gabapentin instead.  She will start taking 100mg  in AM, 100mg  in PM, and 200mg  qhs.  If pain continues, she may increase the AM dose to 200mg  in AM.  Side effects were discussed. She again became tearful during the visit today, we discussed depression and how this can magnify pain symptoms, she does not want to see a therapist at this time.  She will continue all other medications and follow-up in 3 months.    Thank you for allowing me to participate in her care.  Please do not hesitate to call for any questions or concerns.  The duration of this appointment visit was 25 minutes of face-to-face time with the patient.  Greater than 50% of this time was spent in counseling, explanation of diagnosis, planning of further management, and coordination of care.   Ellouise Newer, M.D.

## 2013-11-11 ENCOUNTER — Ambulatory Visit: Payer: Medicare Other | Admitting: Neurology

## 2013-12-21 ENCOUNTER — Other Ambulatory Visit: Payer: Self-pay | Admitting: Emergency Medicine

## 2013-12-21 MED ORDER — ATENOLOL 100 MG PO TABS: 100.0000 mg | ORAL_TABLET | ORAL | Status: DC

## 2014-01-01 ENCOUNTER — Other Ambulatory Visit: Payer: Self-pay | Admitting: Internal Medicine

## 2014-01-01 DIAGNOSIS — F411 Generalized anxiety disorder: Secondary | ICD-10-CM

## 2014-01-01 MED ORDER — CLONAZEPAM 0.5 MG PO TABS: 0.5000 mg | ORAL_TABLET | Freq: Three times a day (TID) | ORAL | Status: DC | PRN

## 2014-01-08 ENCOUNTER — Ambulatory Visit: Payer: Self-pay | Admitting: Internal Medicine

## 2014-01-27 ENCOUNTER — Encounter: Payer: Self-pay | Admitting: Internal Medicine

## 2014-01-27 ENCOUNTER — Ambulatory Visit (INDEPENDENT_AMBULATORY_CARE_PROVIDER_SITE_OTHER): Payer: Medicare Other | Admitting: Internal Medicine

## 2014-01-27 ENCOUNTER — Telehealth: Payer: Self-pay | Admitting: Neurology

## 2014-01-27 VITALS — BP 138/76 | HR 56 | Temp 98.4°F | Resp 16 | Ht 64.5 in | Wt 104.0 lb

## 2014-01-27 DIAGNOSIS — E559 Vitamin D deficiency, unspecified: Secondary | ICD-10-CM

## 2014-01-27 DIAGNOSIS — Z79899 Other long term (current) drug therapy: Secondary | ICD-10-CM | POA: Insufficient documentation

## 2014-01-27 DIAGNOSIS — I10 Essential (primary) hypertension: Secondary | ICD-10-CM

## 2014-01-27 DIAGNOSIS — M8580 Other specified disorders of bone density and structure, unspecified site: Secondary | ICD-10-CM

## 2014-01-27 DIAGNOSIS — E785 Hyperlipidemia, unspecified: Secondary | ICD-10-CM

## 2014-01-27 DIAGNOSIS — R7303 Prediabetes: Secondary | ICD-10-CM | POA: Insufficient documentation

## 2014-01-27 DIAGNOSIS — R7309 Other abnormal glucose: Secondary | ICD-10-CM

## 2014-01-27 LAB — CBC WITH DIFFERENTIAL/PLATELET
BASOS ABS: 0.1 10*3/uL (ref 0.0–0.1)
Basophils Relative: 1 % (ref 0–1)
EOS ABS: 0.4 10*3/uL (ref 0.0–0.7)
EOS PCT: 6 % — AB (ref 0–5)
HCT: 36.1 % (ref 36.0–46.0)
Hemoglobin: 12.2 g/dL (ref 12.0–15.0)
LYMPHS ABS: 1.6 10*3/uL (ref 0.7–4.0)
Lymphocytes Relative: 26 % (ref 12–46)
MCH: 31 pg (ref 26.0–34.0)
MCHC: 33.8 g/dL (ref 30.0–36.0)
MCV: 91.9 fL (ref 78.0–100.0)
Monocytes Absolute: 0.8 10*3/uL (ref 0.1–1.0)
Monocytes Relative: 13 % — ABNORMAL HIGH (ref 3–12)
NEUTROS PCT: 54 % (ref 43–77)
Neutro Abs: 3.2 10*3/uL (ref 1.7–7.7)
PLATELETS: 231 10*3/uL (ref 150–400)
RBC: 3.93 MIL/uL (ref 3.87–5.11)
RDW: 13.4 % (ref 11.5–15.5)
WBC: 6 10*3/uL (ref 4.0–10.5)

## 2014-01-27 LAB — HEMOGLOBIN A1C
Hgb A1c MFr Bld: 5.7 % — ABNORMAL HIGH (ref <5.7)
Mean Plasma Glucose: 117 mg/dL — ABNORMAL HIGH (ref <117)

## 2014-01-27 NOTE — Telephone Encounter (Signed)
Pt called on 01/27/14 at 1:01PM to cancel her f/u on 02/03/14 with Dr. Delice Lesch. Her reason: her husband has too many appt she needs to attend with him.

## 2014-01-27 NOTE — Patient Instructions (Signed)

## 2014-01-27 NOTE — Progress Notes (Signed)
Patient ID: Debra White, female   DOB: [birth year], 80 y.o.   MRN: [id number]   This very nice 80 y.o.MWF presents for 3 month follow up with Hypertension, Hyperlipidemia, Pre-Diabetes and Vitamin D Deficiency. In Jan 2013 , patient had a rt orbital fx after a fall and has ongoing rt facial pains since. Recently she was tried on Lamictal w/o benefit and she stopped the medication.   Patient is treated for HTN (1996) & BP has been controlled at home. Today's BP: 138/76 mmHg. Patient denies any cardiac type chest pain, palpitations, dyspnea/orthopnea/PND, dizziness, claudication, or dependent edema.  Hyperlipidemia is controlled with diet & meds. Patient denies myalgias or other med SE's. Last Lipids were 10/21/2013: Cholesterol, Total 166; HDL Cholesterol by NMR 73; LDL (calc) 70; Triglycerides 113   Also, the patient has history of PreDiabetes and patient denies any symptoms of reactive hypoglycemia, diabetic polys, paresthesias or visual blurring.  Last A1c was 10/21/2013: Hemoglobin-A1c 6.1*    Further, Patient has history of Vitamin D Deficiency and patient supplements vitamin D without any suspected side-effects. Last vitamin D was 07/08/2013: Vit D, 25-Hydroxy 55   Medication List   atenolol 100 MG tablet  Commonly known as:  TENORMIN  Take 1 tablet (100 mg total) by mouth every morning.     cholecalciferol 1000 UNITS tablet  Commonly known as:  VITAMIN D  Take 2,000 Units by mouth daily.     clonazePAM 0.5 MG tablet  Commonly known as:  KLONOPIN  Take 1 tablet (0.5 mg total) by mouth 3 (three) times daily as needed for anxiety.     esomeprazole 40 MG capsule  Commonly known as:  NEXIUM  Take 1 capsule (40 mg total) by mouth daily.     FLORAJEN3 PO  Take by mouth daily.     gabapentin 100 MG capsule  Commonly known as:  NEURONTIN  Take 2 caps in AM, 1 cap at 2 pm, 2 caps in PM     GAS RELIEF PO  Take by mouth daily. OTC     lamoTRIgine 25 MG tablet  Commonly known as:  LAMICTAL   Take 1 tablet (25 mg total) by mouth daily.     lidocaine 5 % ointment  Commonly known as:  XYLOCAINE  Apply 1 application topically 2 (two) times daily as needed.     losartan 100 MG tablet  Commonly known as:  COZAAR  Take 50 mg by mouth daily.     pravastatin 40 MG tablet  Commonly known as:  PRAVACHOL  Take 40 mg by mouth daily.       Allergies  Allergen Reactions  . Latex Hives    TONGUE HAD BLISTERS WHEN HAD DENTAL SURGERY  . Amoxicillin Nausea And Vomiting  . Chlorhexidine Gluconate Itching  . Aspirin     Gi upset  . Barbiturates   . Celebrex [Celecoxib]   . Codeine Itching  . Evista [Raloxifene]   . Lipitor [Atorvastatin]   . Morphine And Related   . Nsaids   . Oruvail [Ketoprofen]   . Paraffin   . Prochlorperazine Other (See Comments)    Uncontrolled shaking  . Proloprim [Trimethoprim]   . Vibra-Tab [Doxycycline]   . Vioxx [Rofecoxib]     edema  . Betadine [Povidone Iodine] Rash  . Caffeine Palpitations   PMHx:   Past Medical History  Diagnosis Date  . Fibromyalgia   . Anxiety   . Dysrhythmia     RX  . Blood transfusion   .  Chronic kidney disease     STONES  . Headache(784.0)   . Arthritis   . Cataract   . Diverticulosis of colon (without mention of hemorrhage) 2007    Colonoscopy  . Candida esophagitis 2013    EGD  . Family history of colon cancer     sister  . Hypertension   . Hyperlipemia   . GERD (gastroesophageal reflux disease)   . Emphysema of lung    FHx:    Reviewed / unchanged SHx:    Reviewed / unchanged  Systems Review:  Constitutional: Denies fever, chills, wt changes, headaches, insomnia, fatigue, night sweats, change in appetite. Eyes: Denies redness, blurred vision, diplopia, discharge, itchy, watery eyes.  ENT: Denies discharge, congestion, post nasal drip, epistaxis, sore throat, earache, hearing loss, dental pain, tinnitus, vertigo, sinus pain, snoring.  CV: Denies chest pain, palpitations, irregular heartbeat,  syncope, dyspnea, diaphoresis, orthopnea, PND, claudication or edema. Respiratory: denies cough, dyspnea, DOE, pleurisy, hoarseness, laryngitis, wheezing.  Gastrointestinal: Denies dysphagia, odynophagia, heartburn, reflux, water brash, abdominal pain or cramps, nausea, vomiting, bloating, diarrhea, constipation, hematemesis, melena, hematochezia  or hemorrhoids. Genitourinary: Denies dysuria, frequency, urgency, nocturia, hesitancy, discharge, hematuria or flank pain. Musculoskeletal: Denies arthralgias, myalgias, stiffness, jt. swelling, pain, limping or strain/sprain.  Skin: Denies pruritus, rash, hives, warts, acne, eczema or change in skin lesion(s). Neuro: No weakness, tremor, incoordination, spasms, paresthesia or pain. Psychiatric: Denies confusion, memory loss or sensory loss. Endo: Denies change in weight, skin or hair change.  Heme/Lymph: No excessive bleeding, bruising or enlarged lymph nodes.  Exam:  BP 138/76  Pulse 56  Temp(Src) 98.4 F (36.9 C) (Temporal)  Resp 16  Ht 5' 4.5" (1.638 m)  Wt 104 lb (47.174 kg)  BMI 17.58 kg/m2  Appears well nourished and in no distress. Eyes: PERRLA, EOMs, conjunctiva no swelling or erythema. Sinuses: No frontal/maxillary tenderness ENT/Mouth: EAC's clear, TM's nl w/o erythema, bulging. Nares clear w/o erythema, swelling, exudates. Oropharynx clear without erythema or exudates. Oral hygiene is good. Tongue normal, non obstructing. Hearing intact.  Neck: Supple. Thyroid nl. Car 2+/2+ without bruits, nodes or JVD. Chest: Respirations nl with BS clear & equal w/o rales, rhonchi, wheezing or stridor.  Cor: Heart sounds normal w/ regular rate and rhythm without sig. murmurs, gallops, clicks, or rubs. Peripheral pulses normal and equal  without edema.  Abdomen: Soft & bowel sounds normal. Non-tender w/o guarding, rebound, hernias, masses, or organomegaly.  Lymphatics: Unremarkable.  Musculoskeletal: Full ROM all peripheral extremities, joint  stability, 5/5 strength, and normal gait.  Skin: Warm, dry without exposed rashes, lesions or ecchymosis apparent.  Neuro: Cranial nerves intact, reflexes equal bilaterally. Sensory-motor testing grossly intact. Tendon reflexes grossly intact.  Pysch: Alert & oriented x 3. Insight and judgement nl & appropriate. No ideations.  Assessment and Plan:  1. Hypertension - Continue monitor blood pressure at home. Continue diet/meds same.  2. Hyperlipidemia - Continue diet/meds, exercise,& lifestyle modifications. Continue monitor periodic cholesterol/liver & renal functions   3. GERD  4. Pre-Diabetes - Continue diet, exercise, lifestyle modifications. Monitor appropriate labs.    5. Vitamin D Deficiency - Continue supplementation.  Recommended regular exercise, BP monitoring, weight control, and discussed med and SE's. Recommended labs to assess and monitor clinical status. Further disposition pending results of labs.

## 2014-01-28 LAB — LIPID PANEL
Cholesterol: 148 mg/dL (ref 0–200)
HDL: 75 mg/dL (ref >39)
LDL Cholesterol: 51 mg/dL (ref 0–99)
Total CHOL/HDL Ratio: 2 Ratio
Triglycerides: 110 mg/dL (ref <150)
VLDL: 22 mg/dL (ref 0–40)

## 2014-01-28 LAB — HEPATIC FUNCTION PANEL
ALT: 19 U/L (ref 0–35)
AST: 20 U/L (ref 0–37)
Albumin: 4.7 g/dL (ref 3.5–5.2)
Alkaline Phosphatase: 79 U/L (ref 39–117)
BILIRUBIN TOTAL: 0.5 mg/dL (ref 0.2–1.2)
Bilirubin, Direct: 0.1 mg/dL (ref 0.0–0.3)
Indirect Bilirubin: 0.4 mg/dL (ref 0.2–1.2)
Total Protein: 7.4 g/dL (ref 6.0–8.3)

## 2014-01-28 LAB — BASIC METABOLIC PANEL WITH GFR
BUN: 14 mg/dL (ref 6–23)
CALCIUM: 9.7 mg/dL (ref 8.4–10.5)
CO2: 31 meq/L (ref 19–32)
Chloride: 96 mEq/L (ref 96–112)
Creat: 0.69 mg/dL (ref 0.50–1.10)
GFR, EST NON AFRICAN AMERICAN: 84 mL/min
GFR, Est African American: >89 mL/min
Glucose, Bld: 85 mg/dL (ref 70–99)
Potassium: 4.5 mEq/L (ref 3.5–5.3)
SODIUM: 134 meq/L — AB (ref 135–145)

## 2014-01-28 LAB — TSH: TSH: 0.827 u[IU]/mL (ref 0.350–4.500)

## 2014-01-28 LAB — MAGNESIUM: MAGNESIUM: 1.9 mg/dL (ref 1.5–2.5)

## 2014-01-28 LAB — VITAMIN D 25 HYDROXY (VIT D DEFICIENCY, FRACTURES): Vit D, 25-Hydroxy: 55 ng/mL (ref 30–89)

## 2014-01-28 LAB — INSULIN, FASTING: INSULIN FASTING, SERUM: 7 u[IU]/mL (ref 3–28)

## 2014-02-02 ENCOUNTER — Ambulatory Visit (INDEPENDENT_AMBULATORY_CARE_PROVIDER_SITE_OTHER): Payer: Medicare Other | Admitting: Physician Assistant

## 2014-02-02 ENCOUNTER — Encounter: Payer: Self-pay | Admitting: Physician Assistant

## 2014-02-02 VITALS — BP 130/70 | HR 64 | Temp 98.0°F | Resp 16 | Wt 105.0 lb

## 2014-02-02 DIAGNOSIS — J029 Acute pharyngitis, unspecified: Secondary | ICD-10-CM

## 2014-02-02 MED ORDER — PREDNISONE 5 MG PO TABS: ORAL_TABLET | ORAL | Status: DC

## 2014-02-02 MED ORDER — AZITHROMYCIN 250 MG PO TABS: ORAL_TABLET | ORAL | Status: DC

## 2014-02-02 NOTE — Progress Notes (Signed)
   Subjective:    Patient ID: Debra White, female    DOB: 1934/02/04, 80 y.o.   MRN: [id number]  Sore Throat  This is a new problem. Episode onset: 3 days. The problem has been gradually worsening. The maximum temperature recorded prior to her arrival was 100 - 100.9 F. Associated symptoms include congestion and coughing. Pertinent negatives include no abdominal pain, diarrhea, drooling, ear discharge, ear pain, headaches, hoarse voice, plugged ear sensation, neck pain, shortness of breath, stridor, swollen glands, trouble swallowing or vomiting. She has tried acetaminophen for the symptoms. The treatment provided no relief.      Review of Systems  Constitutional: Positive for fever, chills and fatigue. Negative for diaphoresis, activity change, appetite change and unexpected weight change.  HENT: Positive for congestion, postnasal drip, rhinorrhea and sore throat. Negative for drooling, ear discharge, ear pain, hoarse voice, sinus pressure, sneezing, tinnitus, trouble swallowing and voice change.   Eyes: Negative.   Respiratory: Positive for cough. Negative for choking, chest tightness, shortness of breath, wheezing and stridor.   Cardiovascular: Negative.   Gastrointestinal: Negative.  Negative for vomiting, abdominal pain and diarrhea.  Musculoskeletal: Positive for myalgias. Negative for arthralgias, back pain, gait problem, joint swelling and neck pain.  Neurological: Negative for headaches.       Objective:   Physical Exam  Constitutional: She appears well-developed and well-nourished.  HENT:  Head: Normocephalic and atraumatic.  Right Ear: External ear normal.  Left Ear: External ear normal.  Mouth/Throat: Uvula is midline and mucous membranes are normal. Posterior oropharyngeal edema and posterior oropharyngeal erythema present.  Eyes: Conjunctivae and EOM are normal. Pupils are equal, round, and reactive to light.  Neck: Normal range of motion. Neck supple.  Cardiovascular:  Normal rate, regular rhythm and normal heart sounds.   Pulmonary/Chest: Effort normal and breath sounds normal.  Abdominal: Soft. Bowel sounds are normal.  Lymphadenopathy:    She has cervical adenopathy.  Skin: Skin is warm and dry.      Assessment & Plan:  1. Acute pharyngitis, unspecified pharyngitis type [462] If worse go to ER, if not better follow up in the office.  - azithromycin (ZITHROMAX) 250 MG tablet; 2 pills the first day and then 1 pill a day for 4 days  Dispense: 6 each; Refill: 1 - predniSONE (DELTASONE) 5 MG tablet; Take one pill two times daily for 3 days, take one pill daily for 4 days.  Dispense: 10 tablet; Refill: 1

## 2014-02-02 NOTE — Patient Instructions (Signed)

## 2014-02-03 ENCOUNTER — Ambulatory Visit: Payer: BC Managed Care – PPO | Admitting: Neurology

## 2014-02-17 ENCOUNTER — Ambulatory Visit (HOSPITAL_COMMUNITY): Payer: BC Managed Care – PPO

## 2014-02-19 ENCOUNTER — Ambulatory Visit (INDEPENDENT_AMBULATORY_CARE_PROVIDER_SITE_OTHER): Payer: Medicare Other | Admitting: Internal Medicine

## 2014-02-19 VITALS — BP 124/68 | HR 60 | Temp 98.1°F | Resp 16 | Ht 64.25 in | Wt 104.8 lb

## 2014-02-19 DIAGNOSIS — N3000 Acute cystitis without hematuria: Secondary | ICD-10-CM

## 2014-02-19 DIAGNOSIS — F411 Generalized anxiety disorder: Secondary | ICD-10-CM

## 2014-02-19 DIAGNOSIS — I10 Essential (primary) hypertension: Secondary | ICD-10-CM

## 2014-02-19 DIAGNOSIS — Z79899 Other long term (current) drug therapy: Secondary | ICD-10-CM

## 2014-02-19 DIAGNOSIS — R5381 Other malaise: Secondary | ICD-10-CM

## 2014-02-19 DIAGNOSIS — R5383 Other fatigue: Secondary | ICD-10-CM

## 2014-02-19 LAB — CBC WITH DIFFERENTIAL/PLATELET
BASOS ABS: 0 10*3/uL (ref 0.0–0.1)
Basophils Relative: 0 % (ref 0–1)
Eosinophils Absolute: 0.1 10*3/uL (ref 0.0–0.7)
Eosinophils Relative: 1 % (ref 0–5)
HEMATOCRIT: 34.7 % — AB (ref 36.0–46.0)
Hemoglobin: 11.9 g/dL — ABNORMAL LOW (ref 12.0–15.0)
LYMPHS PCT: 25 % (ref 12–46)
Lymphs Abs: 2 10*3/uL (ref 0.7–4.0)
MCH: 31.2 pg (ref 26.0–34.0)
MCHC: 34.3 g/dL (ref 30.0–36.0)
MCV: 91.1 fL (ref 78.0–100.0)
MONO ABS: 0.9 10*3/uL (ref 0.1–1.0)
Monocytes Relative: 11 % (ref 3–12)
Neutro Abs: 4.9 10*3/uL (ref 1.7–7.7)
Neutrophils Relative %: 63 % (ref 43–77)
Platelets: 264 10*3/uL (ref 150–400)
RBC: 3.81 MIL/uL — ABNORMAL LOW (ref 3.87–5.11)
RDW: 13.5 % (ref 11.5–15.5)
WBC: 7.8 10*3/uL (ref 4.0–10.5)

## 2014-02-19 MED ORDER — CLONAZEPAM 1 MG PO TABS: ORAL_TABLET | ORAL | Status: DC

## 2014-02-19 NOTE — Patient Instructions (Signed)
Generalized Anxiety Disorder Generalized anxiety disorder (GAD) is a mental disorder. It interferes with life functions, including relationships, work, and school. GAD is different from normal anxiety, which everyone experiences at some point in their lives in response to specific life events and activities. Normal anxiety actually helps us prepare for and get through these life events and activities. Normal anxiety goes away after the event or activity is over.  GAD causes anxiety that is not necessarily related to specific events or activities. It also causes excess anxiety in proportion to specific events or activities. The anxiety associated with GAD is also difficult to control. GAD can vary from mild to severe. People with severe GAD can have intense waves of anxiety with physical symptoms (panic attacks).  SYMPTOMS The anxiety and worry associated with GAD are difficult to control. This anxiety and worry are related to many life events and activities and also occur more days than not for 6 months or longer. People with GAD also have three or more of the following symptoms (one or more in children):  Restlessness.   Fatigue.  Difficulty concentrating.   Irritability.  Muscle tension.  Difficulty sleeping or unsatisfying sleep. DIAGNOSIS GAD is diagnosed through an assessment by your health care provider. Your health care provider will ask you questions aboutyour mood,physical symptoms, and events in your life. Your health care provider may ask you about your medical history and use of alcohol or drugs, including prescription medicines. Your health care provider may also do a physical exam and blood tests. Certain medical conditions and the use of certain substances can cause symptoms similar to those associated with GAD. Your health care provider may refer you to a mental health specialist for further evaluation. TREATMENT The following therapies are usually used to treat GAD:    Medication. Antidepressant medication usually is prescribed for long-term daily control. Antianxiety medicines may be added in severe cases, especially when panic attacks occur.   Talk therapy (psychotherapy). Certain types of talk therapy can be helpful in treating GAD by providing support, education, and guidance. A form of talk therapy called cognitive behavioral therapy can teach you healthy ways to think about and react to daily life events and activities.  Stress managementtechniques. These include yoga, meditation, and exercise and can be very helpful when they are practiced regularly. A mental health specialist can help determine which treatment is best for you. Some people see improvement with one therapy. However, other people require a combination of therapies. Document Released: 10/06/2012 Document Revised: 10/26/2013 Document Reviewed: 10/06/2012 [employer] Patient Information 2015 [employer]. This information is not intended to replace advice given to you by your health care provider. Make sure you discuss any questions you have with your health care provider.  

## 2014-02-20 LAB — URINALYSIS, MICROSCOPIC ONLY
CASTS: NONE SEEN
Crystals: NONE SEEN
Squamous Epithelial / LPF: NONE SEEN

## 2014-02-20 LAB — BASIC METABOLIC PANEL WITH GFR
BUN: 16 mg/dL (ref 6–23)
CHLORIDE: 96 meq/L (ref 96–112)
CO2: 31 mEq/L (ref 19–32)
Calcium: 9.5 mg/dL (ref 8.4–10.5)
Creat: 0.68 mg/dL (ref 0.50–1.10)
GFR, EST NON AFRICAN AMERICAN: 84 mL/min
GFR, Est African American: >89 mL/min
Glucose, Bld: 95 mg/dL (ref 70–99)
Potassium: 4.7 mEq/L (ref 3.5–5.3)
Sodium: 133 mEq/L — ABNORMAL LOW (ref 135–145)

## 2014-02-20 LAB — URINE CULTURE: Colony Count: 6000

## 2014-02-21 ENCOUNTER — Encounter: Payer: Self-pay | Admitting: Internal Medicine

## 2014-02-21 NOTE — Progress Notes (Signed)
Subjective:    Patient ID: Debra White, female    DOB: August 16, 1933, 80 y.o.   MRN: [id number]  HPI Patient presents for f/u w/complaints of increasing anxiety from worry about her husband. She also has her usual generalized aches and pains as well as continuing facial pains relating to prior facial bone fractures.    Medication List   atenolol 100 MG tablet  Commonly known as:  TENORMIN  Take 1 tablet (100 mg total) by mouth every morning.     cholecalciferol 1000 UNITS tablet  Commonly known as:  VITAMIN D  Take 2,000 Units by mouth daily.     clonazePAM 1 MG tablet  Commonly known as:  KLONOPIN  Take 1/2 to 1 tablet up to 3 x day if needed for Nerves     esomeprazole 40 MG capsule  Commonly known as:  NEXIUM  Take 1 capsule (40 mg total) by mouth daily.     FLORAJEN3 PO  Take by mouth daily.     gabapentin 100 MG capsule  Commonly known as:  NEURONTIN  Take 2 caps in AM, 1 cap at 2 pm, 2 caps in PM     GAS RELIEF PO  Take by mouth daily. OTC     lamoTRIgine 25 MG tablet  Commonly known as:  LAMICTAL     lidocaine 5 % ointment  Commonly known as:  XYLOCAINE  Apply 1 application topically 2 (two) times daily as needed.     losartan 100 MG tablet  Commonly known as:  COZAAR  Take 50 mg by mouth daily.     pravastatin 40 MG tablet  Commonly known as:  PRAVACHOL  Take 40 mg by mouth daily.       Allergies  Allergen Reactions  . Latex Hives    TONGUE HAD BLISTERS WHEN HAD DENTAL SURGERY  . Amoxicillin Nausea And Vomiting  . Chlorhexidine Gluconate Itching  . Aspirin     Gi upset  . Barbiturates   . Celebrex [Celecoxib]   . Codeine Itching  . Evista [Raloxifene]   . Lipitor [Atorvastatin]   . Morphine And Related   . Nsaids   . Oruvail [Ketoprofen]   . Paraffin   . Prochlorperazine Other (See Comments)    Uncontrolled shaking  . Proloprim [Trimethoprim]   . Vibra-Tab [Doxycycline]   . Vioxx [Rofecoxib]     edema  . Betadine [Povidone Iodine] Rash   . Caffeine Palpitations   Past Medical History  Diagnosis Date  . Fibromyalgia   . Anxiety   . Dysrhythmia     RX  . Blood transfusion   . Chronic kidney disease     STONES  . Headache(784.0)   . Arthritis   . Cataract   . Diverticulosis of colon (without mention of hemorrhage) 2007    Colonoscopy  . Candida esophagitis 2013    EGD  . Family history of colon cancer     sister  . Hypertension   . Hyperlipemia   . GERD (gastroesophageal reflux disease)   . Emphysema of lung    Past Surgical History  Procedure Laterality Date  . Abdominal hysterectomy    . Hand surgery      BIL   . Shoulder arthroscopy w/ rotator cuff repair      LFT  . Cervical disc surgery    . Back surgery      X2  . Orif tripod fracture  07/13/2011    Procedure: OPEN REDUCTION INTERNAL FIXATION (  ORIF) TRIPOD FRACTURE;  Surgeon: Tyson Alias, MD;  Location: [facility];  Service: ENT;  Laterality: Right;  ORIF RIGHT ZYGOMA, ORBITAL FLOOR EXPLORATION WITH FROST STITCH (TEMPORARY TARSORRHAPHY)  . Colonoscopy    . Facial fracture surgery    . Back surgery     Review of Systems - non contributory to above In addition to the HPI above,  A full 10 point Review of Systems was done, except as stated above, all other Review of Systems were negative  Objective:   Physical Exam  BP 124/68  P 60  T 98.1 F   Resp 16  Ht 5' 4.25"   Wt 104 lb 12.8 oz   BMI 17.85   HEENT - Eac's patent. TM's Nl. EOM's full. PERRLA. NasoOroPharynx clear. Neck - supple. Nl Thyroid. Carotids 2+ & No bruits, nodes, JVD Chest - Clear equal BS w/o Rales, rhonchi, wheezes. Cor - Nl HS. RRR w/o sig MGR. PP 1(+). No edema. Abd - No palpable organomegaly, masses or tenderness. BS nl. MS- FROM w/o deformities. Muscle power, tone and bulk Nl. Gait Nl. Neuro - No obvious Cr N abnormalities. Sensory, motor and Cerebellar functions appear Nl w/o focal abnormalities. Psyche - Mental status normal & appropriate.  Assessment & Plan:    1. Essential hypertension  2. Malaise and Fatigue  3. Acute cystitis - Urine Microscopic - Urine culture  4. Encounter for long-term (current) use of other medications - CBC with Differential - BASIC METABOLIC PANEL WITH GFR  5. Generalized anxiety disorder - clonazePAM (KLONOPIN) 1 MG tablet; Take 1/2 to 1 tablet up to 3 x day if needed for Nerves  Dispense: 90 tablet; Refill: 5

## 2014-03-09 ENCOUNTER — Ambulatory Visit (HOSPITAL_COMMUNITY)
Admission: RE | Admit: 2014-03-09 | Discharge: 2014-03-09 | Disposition: A | Discharge status: Home / Self Care | Payer: Medicare Other | Source: Ambulatory Visit | Attending: Physician Assistant | Admitting: Physician Assistant

## 2014-03-09 DIAGNOSIS — Z780 Asymptomatic menopausal state: Secondary | ICD-10-CM | POA: Insufficient documentation

## 2014-03-09 DIAGNOSIS — Z13820 Encounter for screening for osteoporosis: Secondary | ICD-10-CM | POA: Diagnosis not present

## 2014-03-09 DIAGNOSIS — E2839 Other primary ovarian failure: Secondary | ICD-10-CM

## 2014-03-18 ENCOUNTER — Ambulatory Visit: Payer: Self-pay

## 2014-03-25 ENCOUNTER — Ambulatory Visit (INDEPENDENT_AMBULATORY_CARE_PROVIDER_SITE_OTHER): Payer: Medicare Other | Admitting: *Deleted

## 2014-03-25 DIAGNOSIS — Z23 Encounter for immunization: Secondary | ICD-10-CM

## 2014-03-26 ENCOUNTER — Encounter: Payer: Self-pay | Admitting: Gastroenterology

## 2014-04-21 ENCOUNTER — Other Ambulatory Visit: Payer: Self-pay

## 2014-04-21 MED ORDER — LOSARTAN POTASSIUM 100 MG PO TABS: 50.0000 mg | ORAL_TABLET | Freq: Every day | ORAL | Status: DC

## 2014-05-24 ENCOUNTER — Encounter: Payer: Self-pay | Admitting: Physician Assistant

## 2014-05-24 ENCOUNTER — Ambulatory Visit (INDEPENDENT_AMBULATORY_CARE_PROVIDER_SITE_OTHER): Payer: Medicare Other | Admitting: Physician Assistant

## 2014-05-24 VITALS — BP 102/62 | HR 56 | Temp 98.1°F | Resp 16 | Ht 64.5 in | Wt 105.0 lb

## 2014-05-24 DIAGNOSIS — R7309 Other abnormal glucose: Secondary | ICD-10-CM

## 2014-05-24 DIAGNOSIS — E785 Hyperlipidemia, unspecified: Secondary | ICD-10-CM

## 2014-05-24 DIAGNOSIS — G501 Atypical facial pain: Secondary | ICD-10-CM

## 2014-05-24 DIAGNOSIS — M791 Myalgia, unspecified site: Secondary | ICD-10-CM

## 2014-05-24 DIAGNOSIS — I10 Essential (primary) hypertension: Secondary | ICD-10-CM

## 2014-05-24 DIAGNOSIS — Z79899 Other long term (current) drug therapy: Secondary | ICD-10-CM

## 2014-05-24 DIAGNOSIS — Z1211 Encounter for screening for malignant neoplasm of colon: Secondary | ICD-10-CM

## 2014-05-24 DIAGNOSIS — R7303 Prediabetes: Secondary | ICD-10-CM

## 2014-05-24 DIAGNOSIS — E559 Vitamin D deficiency, unspecified: Secondary | ICD-10-CM

## 2014-05-24 LAB — CBC WITH DIFFERENTIAL/PLATELET
BASOS PCT: 0 % (ref 0–1)
Basophils Absolute: 0 10*3/uL (ref 0.0–0.1)
Eosinophils Absolute: 0.1 10*3/uL (ref 0.0–0.7)
Eosinophils Relative: 2 % (ref 0–5)
HEMATOCRIT: 34.1 % — AB (ref 36.0–46.0)
Hemoglobin: 11.4 g/dL — ABNORMAL LOW (ref 12.0–15.0)
Lymphocytes Relative: 26 % (ref 12–46)
Lymphs Abs: 1.8 10*3/uL (ref 0.7–4.0)
MCH: 31.2 pg (ref 26.0–34.0)
MCHC: 33.4 g/dL (ref 30.0–36.0)
MCV: 93.4 fL (ref 78.0–100.0)
MONO ABS: 0.5 10*3/uL (ref 0.1–1.0)
MONOS PCT: 7 % (ref 3–12)
MPV: 9.9 fL (ref 9.4–12.4)
Neutro Abs: 4.6 10*3/uL (ref 1.7–7.7)
Neutrophils Relative %: 65 % (ref 43–77)
Platelets: 241 10*3/uL (ref 150–400)
RBC: 3.65 MIL/uL — ABNORMAL LOW (ref 3.87–5.11)
RDW: 13.5 % (ref 11.5–15.5)
WBC: 7 10*3/uL (ref 4.0–10.5)

## 2014-05-24 LAB — HEPATIC FUNCTION PANEL
ALBUMIN: 4.5 g/dL (ref 3.5–5.2)
ALT: 20 U/L (ref 0–35)
AST: 24 U/L (ref 0–37)
Alkaline Phosphatase: 64 U/L (ref 39–117)
BILIRUBIN DIRECT: 0.1 mg/dL (ref 0.0–0.3)
Indirect Bilirubin: 0.4 mg/dL (ref 0.2–1.2)
Total Bilirubin: 0.5 mg/dL (ref 0.2–1.2)
Total Protein: 7.2 g/dL (ref 6.0–8.3)

## 2014-05-24 LAB — LIPID PANEL
Cholesterol: 149 mg/dL (ref 0–200)
HDL: 72 mg/dL (ref >39)
LDL Cholesterol: 51 mg/dL (ref 0–99)
Total CHOL/HDL Ratio: 2.1 Ratio
Triglycerides: 128 mg/dL (ref <150)
VLDL: 26 mg/dL (ref 0–40)

## 2014-05-24 LAB — CK: Total CK: 37 U/L (ref 7–177)

## 2014-05-24 LAB — BASIC METABOLIC PANEL WITH GFR
BUN: 15 mg/dL (ref 6–23)
CALCIUM: 9.3 mg/dL (ref 8.4–10.5)
CO2: 32 meq/L (ref 19–32)
CREATININE: 0.78 mg/dL (ref 0.50–1.10)
Chloride: 95 mEq/L — ABNORMAL LOW (ref 96–112)
GFR, Est African American: 84 mL/min
GFR, Est Non African American: 73 mL/min
GLUCOSE: 97 mg/dL (ref 70–99)
Potassium: 4.4 mEq/L (ref 3.5–5.3)
Sodium: 132 mEq/L — ABNORMAL LOW (ref 135–145)

## 2014-05-24 LAB — MAGNESIUM: Magnesium: 1.9 mg/dL (ref 1.5–2.5)

## 2014-05-24 LAB — TSH: TSH: 0.88 u[IU]/mL (ref 0.350–4.500)

## 2014-05-24 MED ORDER — PREDNISONE 20 MG PO TABS: ORAL_TABLET | ORAL | Status: DC

## 2014-05-24 NOTE — Patient Instructions (Addendum)
Use a dropper to put olive oil or canola oil in the effected ear- 2-3 times a week. Let it soak for 20-30 min then you can take a shower or use a baby bulb with warm water to wash out the ear wax.  Do not use Qtips  HOW TO TAKE PREDNSIONE, do not follow the bottle-  I want you to take the prednisone- do 1/2 a pill a day for 5 days, then do 1/2 pill every other day for 6 days, then stop.      Bad carbs also include fruit juice, alcohol, and sweet tea. These are empty calories that do not signal to your brain that you are full.   Please remember the good carbs are still carbs which convert into sugar. So please measure them out no more than 1/2-1 cup of rice, oatmeal, pasta, and beans  Veggies are however free foods! Pile them on.   Not all fruit is created equal. Please see the list below, the fruit at the bottom is higher in sugars than the fruit at the top. Please avoid all dried fruits.

## 2014-05-24 NOTE — Progress Notes (Signed)
Assessment and Plan:  Hypertension: Continue medication, monitor blood pressure at home. Continue DASH diet.  Reminder to go to the ER if any CP, SOB, nausea, dizziness, severe HA, changes vision/speech, left arm numbness and tingling, and jaw pain. Cholesterol: Continue diet and exercise. Check cholesterol.  Pre-diabetes-Continue diet and exercise. Check A1C Vitamin D Def- check level and continue medications.  Diffuse myalgias/hip/back pain- recheck CPK, ESR, ANA- and will do prednisone taper.  GI follow up- refer to GI for   Continue diet and meds as discussed. Further disposition pending results of labs.  HPI 80 y.o. female  presents for 3 month follow up with hypertension, hyperlipidemia, prediabetes and vitamin D. Her blood pressure has been controlled at home, today their BP is BP: 102/62 mmHg She does not workout. She denies chest pain, shortness of breath, dizziness.  She is on cholesterol medication and denies myalgias. Her cholesterol is at goal. The cholesterol last visit was:   Lab Results  Component Value Date   CHOL 148 01/27/2014   HDL 75 01/27/2014   LDLCALC 51 01/27/2014   TRIG 110 01/27/2014   CHOLHDL 2.0 01/27/2014   She has been working on diet and exercise for prediabetes, and denies polydipsia, polyuria and visual disturbances. Last A1C in the office was:  Lab Results  Component Value Date   HGBA1C 5.7* 01/27/2014   Patient is on Vitamin D supplement.   Lab Results  Component Value Date   VD25OH 34 01/27/2014     She has facial pain/neuraliga, follows with Dr. Delice Lesch, and is on lidocaine ointment, klonopin and gabapentin which helps some but she complains of diffuse body pains all over and requesting prednisone. States she is always cold. States she has bilateral hip pain and lower back pain.  She is suppose to follow up with a GI doctor but her doctor retired. She states she has decreased appetite and has gas, she takes protonix which helps.   Current  Medications:  Current Outpatient Prescriptions on File Prior to Visit  Medication Sig Dispense Refill  . atenolol (TENORMIN) 100 MG tablet Take 1 tablet (100 mg total) by mouth every morning. 90 tablet 1  . cholecalciferol (VITAMIN D) 1000 UNITS tablet Take 2,000 Units by mouth daily.    . clonazePAM (KLONOPIN) 1 MG tablet Take 1/2 to 1 tablet up to 3 x day if needed for Nerves 90 tablet 5  . esomeprazole (NEXIUM) 40 MG capsule Take 1 capsule (40 mg total) by mouth daily. 30 capsule 99  . gabapentin (NEURONTIN) 100 MG capsule Take 2 caps in AM, 1 cap at 2 pm, 2 caps in PM 150 capsule 11  . lidocaine (XYLOCAINE) 5 % ointment Apply 1 application topically 2 (two) times daily as needed. 35.44 g 3  . losartan (COZAAR) 100 MG tablet Take 0.5 tablets (50 mg total) by mouth daily. 90 tablet 3  . pravastatin (PRAVACHOL) 40 MG tablet Take 40 mg by mouth daily.    . Probiotic Product (FLORAJEN3 PO) Take by mouth daily.    . Simethicone (GAS RELIEF PO) Take by mouth daily. OTC    . [DISCONTINUED] omeprazole (PRILOSEC OTC) 20 MG tablet Take 20 mg by mouth daily.     No current facility-administered medications on file prior to visit.   Medical History:  Past Medical History  Diagnosis Date  . Fibromyalgia   . Anxiety   . Dysrhythmia     RX  . Blood transfusion   . Chronic kidney disease  STONES  . Headache(784.0)   . Arthritis   . Cataract   . Diverticulosis of colon (without mention of hemorrhage) 2007    Colonoscopy  . Candida esophagitis 2013    EGD  . Family history of colon cancer     sister  . Hypertension   . Hyperlipemia   . GERD (gastroesophageal reflux disease)   . Emphysema of lung    Allergies:  Allergies  Allergen Reactions  . Latex Hives    TONGUE HAD BLISTERS WHEN HAD DENTAL SURGERY  . Amoxicillin Nausea And Vomiting  . Chlorhexidine Gluconate Itching  . Aspirin     Gi upset  . Barbiturates   . Celebrex [Celecoxib]   . Codeine Itching  . Evista [Raloxifene]    . Lipitor [Atorvastatin]   . Morphine And Related   . Nsaids   . Oruvail [Ketoprofen]   . Paraffin   . Prochlorperazine Other (See Comments)    Uncontrolled shaking  . Proloprim [Trimethoprim]   . Vibra-Tab [Doxycycline]   . Vioxx [Rofecoxib]     edema  . Betadine [Povidone Iodine] Rash  . Caffeine Palpitations   Review of Systems: see HPI  Family history- Review and unchanged Social history- Review and unchanged Physical Exam: BP 102/62 mmHg  Pulse 56  Temp(Src) 98.1 F (36.7 C)  Resp 16  Ht 5' 4.5" (1.638 m)  Wt 105 lb (47.628 kg)  BMI 17.75 kg/m2 Wt Readings from Last 3 Encounters:  05/24/14 105 lb (47.628 kg)  02/19/14 104 lb 12.8 oz (47.537 kg)  02/02/14 105 lb (47.628 kg)   General Appearance: Well nourished, in no apparent distress. Eyes: PERRLA, EOMs, conjunctiva no swelling or erythema Sinuses: No Frontal/maxillary tenderness ENT/Mouth: Ext aud canals clear, TMs without erythema, bulging. No erythema, swelling, or exudate on post pharynx.  Tonsils not swollen or erythematous. Hearing normal.  Neck: Supple, thyroid normal.  Respiratory: Respiratory effort normal, BS equal bilaterally without rales, rhonchi, wheezing or stridor.  Cardio: RRR with no MRGs. Brisk peripheral pulses without edema.  Abdomen: Soft, + BS.  Non tender, no guarding, rebound, hernias, masses. Lymphatics: Non tender without lymphadenopathy.  Musculoskeletal: Full ROM, 5/5 strength, normal gait.  Skin: Warm, dry without rashes, lesions, ecchymosis.  Neuro: Cranial nerves intact. Normal muscle tone, no cerebellar symptoms. Sensation intact.  Psych: Awake and oriented X 3, normal affect, Insight and Judgment appropriate.    Vicie Mutters, PA-C 10:04 AM [facility]

## 2014-05-25 LAB — VITAMIN D 25 HYDROXY (VIT D DEFICIENCY, FRACTURES): Vit D, 25-Hydroxy: 61 ng/mL (ref 30–100)

## 2014-05-25 LAB — HEMOGLOBIN A1C
HEMOGLOBIN A1C: 5.7 % — AB (ref <5.7)
Mean Plasma Glucose: 117 mg/dL — ABNORMAL HIGH (ref <117)

## 2014-05-25 LAB — SEDIMENTATION RATE: Sed Rate: 4 mm/hr (ref 0–22)

## 2014-05-27 ENCOUNTER — Other Ambulatory Visit: Payer: Self-pay | Admitting: *Deleted

## 2014-05-27 MED ORDER — ATENOLOL 100 MG PO TABS: 100.0000 mg | ORAL_TABLET | ORAL | Status: DC

## 2014-05-31 ENCOUNTER — Other Ambulatory Visit: Payer: Self-pay

## 2014-05-31 MED ORDER — PRAVASTATIN SODIUM 40 MG PO TABS: 40.0000 mg | ORAL_TABLET | Freq: Every day | ORAL | Status: DC

## 2014-06-28 ENCOUNTER — Other Ambulatory Visit: Payer: Self-pay | Admitting: Internal Medicine

## 2014-06-28 DIAGNOSIS — Z1231 Encounter for screening mammogram for malignant neoplasm of breast: Secondary | ICD-10-CM

## 2014-07-05 ENCOUNTER — Encounter: Payer: Self-pay | Admitting: Gastroenterology

## 2014-07-05 ENCOUNTER — Other Ambulatory Visit: Payer: Self-pay

## 2014-07-05 ENCOUNTER — Ambulatory Visit (INDEPENDENT_AMBULATORY_CARE_PROVIDER_SITE_OTHER): Payer: PPO | Admitting: Gastroenterology

## 2014-07-05 VITALS — BP 132/82 | HR 60 | Ht 64.0 in | Wt 104.5 lb

## 2014-07-05 DIAGNOSIS — K219 Gastro-esophageal reflux disease without esophagitis: Secondary | ICD-10-CM

## 2014-07-05 DIAGNOSIS — Z800 Family history of malignant neoplasm of digestive organs: Secondary | ICD-10-CM

## 2014-07-05 DIAGNOSIS — K589 Irritable bowel syndrome without diarrhea: Secondary | ICD-10-CM

## 2014-07-05 MED ORDER — HYOSCYAMINE SULFATE 0.125 MG SL SUBL: SUBLINGUAL_TABLET | SUBLINGUAL | Status: DC

## 2014-07-05 NOTE — Patient Instructions (Addendum)
Continue over the counter Nexium 20 mg taking 2 tablets by mouth daily and also Florajen probiotic daily.   Thank you for choosing me and [facility].  Pricilla Riffle. Dagoberto Ligas., MD., Marval Regal  cc: Unk Pinto, MD

## 2014-07-05 NOTE — Progress Notes (Signed)
    History of Present Illness: This is a 81 year old female accompanied by her husband. She has chronic GERD that is uncontrolled on Nexium 40 mg daily. This is no longer covered under her insurance plan he plans to his Nexium over-the-counter instead. Has occasional mild lower abdominal pain and occasional loose stools. She had diverticulitis complicated by a small abscess in 2014. Endoscopy and colonoscopy are up-to-date.  Current Medications, Allergies, Past Medical History, Past Surgical History, Family History and Social History were reviewed in [employer] record.  Physical Exam: General: Well developed , well nourished, no acute distress Head: Normocephalic and atraumatic Eyes:  sclerae anicteric, EOMI Ears: Normal auditory acuity Mouth: No deformity or lesions Lungs: Clear throughout to auscultation Heart: Regular rate and rhythm; no murmurs, rubs or bruits Abdomen: Soft, non tender and non distended. No masses, hepatosplenomegaly or hernias noted. Normal Bowel sounds Musculoskeletal: Symmetrical with no gross deformities  Pulses:  Normal pulses noted Extremities: No clubbing, cyanosis, edema or deformities noted Neurological: Alert oriented x 4, grossly nonfocal Psychological:  Alert and cooperative. Normal mood and affect  Assessment and Recommendations:  1. GERD. Continue Nexium 40 mg daily and standard antireflux measures. She prefer to purchase Nexium 20 mg and take 2 qam.   2. IBS. She has occasional loose stools and occasional mild lower abdominal pain. Symptoms improved on daily probiotics. Continue daily probiotics.  3. Diverticulitis complicated by small abscess in 2014. Long-term high fiber diet with adequate daily water intake.  4. Family history of colon cancer in her sister. She underwent colonoscopy in September 2013 which was unremarkable. No plans for future screening colonoscopies as she will be over age 22 her 5 year interval exam is  due.

## 2014-07-05 NOTE — Progress Notes (Signed)
Patient ID: Debra White, female   DOB: Jan 29, 1934, 81 y.o.   MRN: [id number] Error

## 2014-07-08 ENCOUNTER — Encounter: Payer: Self-pay | Admitting: Internal Medicine

## 2014-07-23 ENCOUNTER — Other Ambulatory Visit: Payer: Self-pay | Admitting: Internal Medicine

## 2014-07-23 ENCOUNTER — Ambulatory Visit (HOSPITAL_COMMUNITY)
Admission: RE | Admit: 2014-07-23 | Discharge: 2014-07-23 | Disposition: A | Discharge status: Home / Self Care | Payer: PPO | Source: Ambulatory Visit | Attending: Internal Medicine | Admitting: Internal Medicine

## 2014-07-23 DIAGNOSIS — Z1231 Encounter for screening mammogram for malignant neoplasm of breast: Secondary | ICD-10-CM | POA: Insufficient documentation

## 2014-07-26 ENCOUNTER — Other Ambulatory Visit: Payer: Self-pay | Admitting: Internal Medicine

## 2014-07-26 ENCOUNTER — Encounter: Payer: Self-pay | Admitting: Internal Medicine

## 2014-07-26 ENCOUNTER — Telehealth: Payer: Self-pay | Admitting: *Deleted

## 2014-07-26 ENCOUNTER — Ambulatory Visit (INDEPENDENT_AMBULATORY_CARE_PROVIDER_SITE_OTHER): Payer: PPO | Admitting: Internal Medicine

## 2014-07-26 VITALS — BP 126/66 | HR 64 | Temp 98.2°F | Resp 16 | Ht 64.5 in | Wt 105.0 lb

## 2014-07-26 DIAGNOSIS — J018 Other acute sinusitis: Secondary | ICD-10-CM

## 2014-07-26 DIAGNOSIS — R928 Other abnormal and inconclusive findings on diagnostic imaging of breast: Secondary | ICD-10-CM

## 2014-07-26 MED ORDER — PREDNISONE 20 MG PO TABS: ORAL_TABLET | ORAL | Status: DC

## 2014-07-26 MED ORDER — AZITHROMYCIN 250 MG PO TABS: ORAL_TABLET | ORAL | Status: DC

## 2014-07-26 MED ORDER — AZELASTINE HCL 0.1 % NA SOLN: NASAL | Status: DC

## 2014-07-26 NOTE — Telephone Encounter (Signed)
Patient called and states she can not tolerate Prednisone 20 mg tid.  OK to cut dose in 1/2 on the scheduled listed on her RX, per Dr [name].

## 2014-07-26 NOTE — Patient Instructions (Signed)
Allergic Rhinitis Allergic rhinitis is when the mucous membranes in the nose respond to allergens. Allergens are particles in the air that cause your body to have an allergic reaction. This causes you to release allergic antibodies. Through a chain of events, these eventually cause you to release histamine into the blood stream. Although meant to protect the body, it is this release of histamine that causes your discomfort, such as frequent sneezing, congestion, and an itchy, runny nose.  CAUSES  Seasonal allergic rhinitis (hay fever) is caused by pollen allergens that may come from grasses, trees, and weeds. Year-round allergic rhinitis (perennial allergic rhinitis) is caused by allergens such as house dust mites, pet dander, and mold spores.  SYMPTOMS   Nasal stuffiness (congestion).  Itchy, runny nose with sneezing and tearing of the eyes. DIAGNOSIS  Your health care provider can help you determine the allergen or allergens that trigger your symptoms. If you and your health care provider are unable to determine the allergen, skin or blood testing may be used. TREATMENT  Allergic rhinitis does not have a cure, but it can be controlled by:  Medicines and allergy shots (immunotherapy).  Avoiding the allergen. Hay fever may often be treated with antihistamines in pill or nasal spray forms. Antihistamines block the effects of histamine. There are over-the-counter medicines that may help with nasal congestion and swelling around the eyes. Check with your health care provider before taking or giving this medicine.  If avoiding the allergen or the medicine prescribed do not work, there are many new medicines your health care provider can prescribe. Stronger medicine may be used if initial measures are ineffective. Desensitizing injections can be used if medicine and avoidance does not work. Desensitization is when a patient is given ongoing shots until the body becomes less sensitive to the allergen.  Make sure you follow up with your health care provider if problems continue. HOME CARE INSTRUCTIONS It is not possible to completely avoid allergens, but you can reduce your symptoms by taking steps to limit your exposure to them. It helps to know exactly what you are allergic to so that you can avoid your specific triggers. SEEK MEDICAL CARE IF:   You have a fever.  You develop a cough that does not stop easily (persistent).  You have shortness of breath.  You start wheezing.  Symptoms interfere with normal daily activities. Document Released: 03/06/2001 Document Revised: 06/16/2013 Document Reviewed: 02/16/2013 [facility] Patient Information 2015 [place], [place]. This information is not intended to replace advice given to you by your health care provider. Make sure you discuss any questions you have with your health care provider.  Sinusitis Sinusitis is redness, soreness, and puffiness (inflammation) of the air pockets in the bones of your face (sinuses). The redness, soreness, and puffiness can cause air and mucus to get trapped in your sinuses. This can allow germs to grow and cause an infection.  HOME CARE   Drink enough fluids to keep your pee (urine) clear or pale yellow.  Use a humidifier in your home.  Run a hot shower to create steam in the bathroom. Sit in the bathroom with the door closed. Breathe in the steam 3-4 times a day.  Put a warm, moist washcloth on your face 3-4 times a day, or as told by your doctor.  Use salt water sprays (saline sprays) to wet the thick fluid in your nose. This can help the sinuses drain.  Only take medicine as told by your doctor. GET HELP RIGHT  AWAY IF:   Your pain gets worse.  You have very bad headaches.  You are sick to your stomach (nauseous).  You throw up (vomit).  You are very sleepy (drowsy) all the time.  Your face is puffy (swollen).  Your vision changes.  You have a stiff neck.  You have trouble breathing. MAKE  SURE YOU:   Understand these instructions.  Will watch your condition.  Will get help right away if you are not doing well or get worse. Document Released: 11/28/2007 Document Revised: 03/05/2012 Document Reviewed: 01/15/2012 [facility] Patient Information 2015 [place], [place]. This information is not intended to replace advice given to you by your health care provider. Make sure you discuss any questions you have with your health care provider.

## 2014-07-26 NOTE — Progress Notes (Signed)
Subjective:    Patient ID: Debra White, female    DOB: 21-Jan-1934, 81 y.o.   MRN: [id number]  HPI  Patient presents w/complaints of head congestion and pressure and soreness in her frontal and maxillary areas. No fever/chills. Has sl PND and occas watery nasal drainage.   Medication Sig  . acetaminophen (TYLENOL ARTHRITIS PAIN) 650 MG CR tablet Take 650 mg by mouth every 8 (eight) hours as needed for pain.  Marland Kitchen atenolol (TENORMIN) 100 MG tablet Take 1 tablet (100 mg total) by mouth every morning.  . cholecalciferol (VITAMIN D) 1000 UNITS tablet Take 2,000 Units by mouth daily.  . clonazePAM (KLONOPIN) 1 MG tablet Take 1/2 to 1 tablet up to 3 x day if needed for Nerves  . gabapentin (NEURONTIN) 100 MG capsule Take 2 caps in AM, 1 cap at 2 pm, 2 caps in PM  . hyoscyamine (LEVSIN SL) 0.125 MG SL tablet Dissolve one tablet by mouth every 4-6 hours as needed  . lidocaine (XYLOCAINE) 5 % ointment Apply 1 application topically 2 (two) times daily as needed.  Marland Kitchen losartan (COZAAR) 100 MG tablet Take 0.5 tablets (50 mg total) by mouth daily.  . pravastatin (PRAVACHOL) 40 MG tablet Take 1 tablet (40 mg total) by mouth daily.  . Probiotic Product (FLORAJEN3 PO) Take by mouth daily.  . Simethicone (GAS RELIEF PO) Take by mouth daily. OTC  . predniSONE (DELTASONE) 20 MG tablet 3 tablets daily with food for 3 days, 2 tabs daily for 3 days, 1 tab a day for 5 days.   Allergies  Allergen Reactions  . Latex Hives    TONGUE HAD BLISTERS WHEN HAD DENTAL SURGERY  . Amoxicillin Nausea And Vomiting  . Chlorhexidine Gluconate Itching  . Aspirin     Gi upset  . Barbiturates   . Celebrex [Celecoxib]   . Codeine Itching  . Evista [Raloxifene]   . Lipitor [Atorvastatin]   . Morphine And Related   . Nsaids   . Oruvail [Ketoprofen]   . Paraffin   . Prochlorperazine Other (See Comments)    Uncontrolled shaking  . Proloprim [Trimethoprim]   . Vibra-Tab [Doxycycline]   . Vioxx [Rofecoxib]     edema  .  Betadine [Povidone Iodine] Rash  . Caffeine Palpitations   Past Medical History  Diagnosis Date  . Fibromyalgia   . Anxiety   . Dysrhythmia     RX  . Blood transfusion   . Chronic kidney disease     STONES  . Headache(784.0)   . Arthritis   . Cataract   . Diverticulosis of colon (without mention of hemorrhage) 2007    Colonoscopy  . Candida esophagitis 2013    EGD  . Family history of colon cancer     sister  . Hypertension   . Hyperlipemia   . GERD (gastroesophageal reflux disease)   . Emphysema of lung    Past Surgical History  Procedure Laterality Date  . Abdominal hysterectomy    . Hand surgery      BIL   . Shoulder arthroscopy w/ rotator cuff repair      LFT  . Cervical disc surgery    . Back surgery      X2  . Orif tripod fracture  07/13/2011    Procedure: OPEN REDUCTION INTERNAL FIXATION (ORIF) TRIPOD FRACTURE;  Surgeon: Tyson Alias, MD;  Location: [facility];  Service: ENT;  Laterality: Right;  ORIF RIGHT ZYGOMA, ORBITAL FLOOR EXPLORATION WITH FROST STITCH (TEMPORARY TARSORRHAPHY)  .  Colonoscopy    . Facial fracture surgery    . Back surgery     Review of Systems  10 pt review neg except as above.    Objective:   Physical Exam BP 126/66 mmHg  Pulse 64  Temp(Src) 98.2 F (36.8 C)  Resp 16  Ht 5' 4.5" (1.638 m)  Wt 105 lb (47.628 kg)  BMI 17.75 kg/m2  HEENT - Eac's patent. TM's Nl. EOM's full. PERRLA.(+) tender Frontal/Maxillary sinus areas.  NasoOroPharynx clear. Neck - supple. Nl Thyroid. Carotids 2+ & No bruits, nodes, JVD Chest - Clear equal BS w/o Rales, rhonchi, wheezes. Cor - Nl HS. RRR w/o sig MGR. PP 1(+). No edema. Abd - No palpable organomegaly, masses or tenderness. BS nl. MS- FROM w/o deformities. Muscle power, tone and bulk Nl. Gait Nl. Neuro - No obvious Cr N abnormalities. Sensory, motor and Cerebellar functions appear Nl w/o focal abnormalities. Psyche - Mental status normal & appropriate.  No delusions, ideations or obvious mood  abnormalities.    Assessment & Plan:   1. Pan sinusitis  - predniSONE (DELTASONE) 20 MG tablet; Dispense: 20 tablet; - azithromycin (ZITHROMAX) 250 MG tablet; Refill: 1 - azelastine (ASTELIN) 0.1 % nasal spray; Use 1 to 2 sprays each nostril 2 x day - morning & night for nasal drainage & congestion Refill: 99  - ROV - prn

## 2014-08-04 ENCOUNTER — Ambulatory Visit
Admission: RE | Admit: 2014-08-04 | Discharge: 2014-08-04 | Disposition: A | Discharge status: Home / Self Care | Payer: PPO | Source: Ambulatory Visit | Attending: Internal Medicine | Admitting: Internal Medicine

## 2014-08-04 DIAGNOSIS — R928 Other abnormal and inconclusive findings on diagnostic imaging of breast: Secondary | ICD-10-CM

## 2014-08-11 ENCOUNTER — Other Ambulatory Visit: Payer: Self-pay | Admitting: Orthopedic Surgery

## 2014-08-11 DIAGNOSIS — M545 Low back pain: Secondary | ICD-10-CM

## 2014-08-23 ENCOUNTER — Other Ambulatory Visit: Payer: Self-pay | Admitting: Internal Medicine

## 2014-08-27 ENCOUNTER — Ambulatory Visit
Admission: RE | Admit: 2014-08-27 | Discharge: 2014-08-27 | Disposition: A | Discharge status: Home / Self Care | Payer: PPO | Source: Ambulatory Visit | Attending: Orthopedic Surgery | Admitting: Orthopedic Surgery

## 2014-08-27 DIAGNOSIS — M545 Low back pain: Secondary | ICD-10-CM

## 2014-09-02 ENCOUNTER — Other Ambulatory Visit: Payer: Self-pay | Admitting: *Deleted

## 2014-09-02 ENCOUNTER — Ambulatory Visit (INDEPENDENT_AMBULATORY_CARE_PROVIDER_SITE_OTHER): Payer: PPO | Admitting: Internal Medicine

## 2014-09-02 ENCOUNTER — Encounter: Payer: Self-pay | Admitting: Internal Medicine

## 2014-09-02 VITALS — BP 140/62 | HR 60 | Temp 97.7°F | Resp 16 | Ht 63.5 in | Wt 105.0 lb

## 2014-09-02 DIAGNOSIS — I10 Essential (primary) hypertension: Secondary | ICD-10-CM

## 2014-09-02 DIAGNOSIS — E559 Vitamin D deficiency, unspecified: Secondary | ICD-10-CM

## 2014-09-02 DIAGNOSIS — Z9181 History of falling: Secondary | ICD-10-CM

## 2014-09-02 DIAGNOSIS — Z1212 Encounter for screening for malignant neoplasm of rectum: Secondary | ICD-10-CM

## 2014-09-02 DIAGNOSIS — Z1331 Encounter for screening for depression: Secondary | ICD-10-CM

## 2014-09-02 DIAGNOSIS — G44329 Chronic post-traumatic headache, not intractable: Secondary | ICD-10-CM

## 2014-09-02 DIAGNOSIS — R7309 Other abnormal glucose: Secondary | ICD-10-CM

## 2014-09-02 DIAGNOSIS — R7303 Prediabetes: Secondary | ICD-10-CM

## 2014-09-02 DIAGNOSIS — Z79899 Other long term (current) drug therapy: Secondary | ICD-10-CM

## 2014-09-02 DIAGNOSIS — F329 Major depressive disorder, single episode, unspecified: Secondary | ICD-10-CM

## 2014-09-02 DIAGNOSIS — F32A Depression, unspecified: Secondary | ICD-10-CM

## 2014-09-02 DIAGNOSIS — E785 Hyperlipidemia, unspecified: Secondary | ICD-10-CM

## 2014-09-02 DIAGNOSIS — G501 Atypical facial pain: Secondary | ICD-10-CM

## 2014-09-02 LAB — CBC WITH DIFFERENTIAL/PLATELET
BASOS ABS: 0.1 10*3/uL (ref 0.0–0.1)
BASOS PCT: 1 % (ref 0–1)
EOS ABS: 0.2 10*3/uL (ref 0.0–0.7)
EOS PCT: 3 % (ref 0–5)
HCT: 35.2 % — ABNORMAL LOW (ref 36.0–46.0)
Hemoglobin: 11.9 g/dL — ABNORMAL LOW (ref 12.0–15.0)
LYMPHS PCT: 28 % (ref 12–46)
Lymphs Abs: 1.8 10*3/uL (ref 0.7–4.0)
MCH: 31.9 pg (ref 26.0–34.0)
MCHC: 33.8 g/dL (ref 30.0–36.0)
MCV: 94.4 fL (ref 78.0–100.0)
MPV: 9.4 fL (ref 8.6–12.4)
Monocytes Absolute: 0.7 10*3/uL (ref 0.1–1.0)
Monocytes Relative: 11 % (ref 3–12)
Neutro Abs: 3.6 10*3/uL (ref 1.7–7.7)
Neutrophils Relative %: 57 % (ref 43–77)
PLATELETS: 284 10*3/uL (ref 150–400)
RBC: 3.73 MIL/uL — ABNORMAL LOW (ref 3.87–5.11)
RDW: 13.1 % (ref 11.5–15.5)
WBC: 6.3 10*3/uL (ref 4.0–10.5)

## 2014-09-02 MED ORDER — GABAPENTIN 300 MG PO CAPS: ORAL_CAPSULE | ORAL | Status: DC

## 2014-09-02 MED ORDER — CITALOPRAM HYDROBROMIDE 20 MG PO TABS: ORAL_TABLET | ORAL | Status: DC

## 2014-09-02 MED ORDER — PREDNISONE 20 MG PO TABS: ORAL_TABLET | ORAL | Status: AC

## 2014-09-02 MED ORDER — PRAVASTATIN SODIUM 40 MG PO TABS: 40.0000 mg | ORAL_TABLET | Freq: Every day | ORAL | Status: DC

## 2014-09-02 MED ORDER — GABAPENTIN 100 MG PO CAPS: ORAL_CAPSULE | ORAL | Status: DC

## 2014-09-02 NOTE — Patient Instructions (Signed)
Recommend the book "The END of DIETING" by Dr Excell Seltzer   & the book "The END of DIABETES " by Dr Excell Seltzer  At [email or web address] - get book & Audio CD's      Being diabetic has a  300% increased risk for heart attack, stroke, cancer, and alzheimer- type vascular dementia. It is very important that you work harder with diet by avoiding all foods that are white. Avoid white rice (brown & wild rice is OK), white potatoes (sweetpotatoes in moderation is OK), White bread or wheat bread or anything made out of white flour like bagels, donuts, rolls, buns, biscuits, cakes, pastries, cookies, pizza crust, and pasta (made from white flour & egg whites) - vegetarian pasta or spinach or wheat pasta is OK. Multigrain breads like Arnold's or [employer], or multigrain sandwich thins or flatbreads.  Diet, exercise and weight loss can reverse and cure diabetes in the early stages.  Diet, exercise and weight loss is very important in the control and prevention of complications of diabetes which affects every system in your body, ie. Brain - dementia/stroke, eyes - glaucoma/blindness, heart - heart attack/heart failure, kidneys - dialysis, stomach - gastric paralysis, intestines - malabsorption, nerves - severe painful neuritis, circulation - gangrene & loss of a leg(s), and finally cancer and Alzheimers.    I recommend avoid fried & greasy foods,  sweets/candy, white rice (brown or wild rice or Quinoa is OK), white potatoes (sweet potatoes are OK) - anything made from white flour - bagels, doughnuts, rolls, buns, biscuits,white and wheat breads, pizza crust and traditional pasta made of white flour & egg white(vegetarian pasta or spinach or wheat pasta is OK).  Multi-grain bread is OK - like multi-grain flat bread or sandwich thins. Avoid alcohol in excess. Exercise is also important.    Eat all the vegetables you want - avoid meat, especially red meat and dairy - especially cheese.  Cheese is the most  concentrated form of trans-fats which is the worst thing to clog up our arteries. Veggie cheese is OK which can be found in the fresh produce section at [employer] or [employer] or [employer]  Preventive Care for Adults A healthy lifestyle and preventive care can promote health and wellness. Preventive health guidelines for women include the following key practices.  A routine yearly physical is a good way to check with your health care provider about your health and preventive screening. It is a chance to share any concerns and updates on your health and to receive a thorough exam.  Visit your dentist for a routine exam and preventive care every 6 months. Brush your teeth twice a day and floss once a day. Good oral hygiene prevents tooth decay and gum disease.  The frequency of eye exams is based on your age, health, family medical history, use of contact lenses, and other factors. Follow your health care provider's recommendations for frequency of eye exams.  Eat a healthy diet. Foods like vegetables, fruits, whole grains, low-fat dairy products, and lean protein foods contain the nutrients you need without too many calories. Decrease your intake of foods high in solid fats, added sugars, and salt. Eat the right amount of calories for you.Get information about a proper diet from your health care provider, if necessary.  Regular physical exercise is one of the most important things you can do for your health. Most adults should get at least 150 minutes of moderate-intensity exercise (any activity that increases your heart rate and  causes you to sweat) each week. In addition, most adults need muscle-strengthening exercises on 2 or more days a week.  Maintain a healthy weight. The body mass index (BMI) is a screening tool to identify possible weight problems. It provides an estimate of body fat based on height and weight. Your health care provider can find your BMI and can help you achieve or  maintain a healthy weight.For adults 20 years and older:  A BMI below 18.5 is considered underweight.  A BMI of 18.5 to 24.9 is normal.  A BMI of 25 to 29.9 is considered overweight.  A BMI of 30 and above is considered obese.  Maintain normal blood lipids and cholesterol levels by exercising and minimizing your intake of saturated fat. Eat a balanced diet with plenty of fruit and vegetables. If your lipid or cholesterol levels are high, you are over 50, or you are at high risk for heart disease, you may need your cholesterol levels checked more frequently.Ongoing high lipid and cholesterol levels should be treated with medicines if diet and exercise are not working.  If you smoke, find out from your health care provider how to quit. If you do not use tobacco, do not start.  Lung cancer screening is recommended for adults aged 55-80 years who are at high risk for developing lung cancer because of a history of smoking. A yearly low-dose CT scan of the lungs is recommended for people who have at least a 30-pack-year history of smoking and are a current smoker or have quit within the past 15 years. A pack year of smoking is smoking an average of 1 pack of cigarettes a day for 1 year (for example: 1 pack a day for 30 years or 2 packs a day for 15 years). Yearly screening should continue until the smoker has stopped smoking for at least 15 years. Yearly screening should be stopped for people who develop a health problem that would prevent them from having lung cancer treatment.  Avoid use of street drugs. Do not share needles with anyone. Ask for help if you need support or instructions about stopping the use of drugs.  High blood pressure causes heart disease and increases the risk of stroke.  Ongoing high blood pressure should be treated with medicines if weight loss and exercise do not work.  If you are 55-79 years old, ask your health care provider if you should take aspirin to prevent  strokes.  Diabetes screening involves taking a blood sample to check your fasting blood sugar level. This should be done once every 3 years, after age 45, if you are within normal weight and without risk factors for diabetes. Testing should be considered at a younger age or be carried out more frequently if you are overweight and have at least 1 risk factor for diabetes.  Breast cancer screening is essential preventive care for women. You should practice "breast self-awareness." This means understanding the normal appearance and feel of your breasts and may include breast self-examination. Any changes detected, no matter how small, should be reported to a health care provider. Women in their 20s and 30s should have a clinical breast exam (CBE) by a health care provider as part of a regular health exam every 1 to 3 years. After age 40, women should have a CBE every year. Starting at age 40, women should consider having a mammogram (breast X-ray test) every year. Women who have a family history of breast cancer should talk to their health   care provider about genetic screening. Women at a high risk of breast cancer should talk to their health care providers about having an MRI and a mammogram every year.  Breast cancer gene (BRCA)-related cancer risk assessment is recommended for women who have family members with BRCA-related cancers. BRCA-related cancers include breast, ovarian, tubal, and peritoneal cancers. Having family members with these cancers may be associated with an increased risk for harmful changes (mutations) in the breast cancer genes BRCA1 and BRCA2. Results of the assessment will determine the need for genetic counseling and BRCA1 and BRCA2 testing.  Routine pelvic exams to screen for cancer are no longer recommended for nonpregnant women who are considered low risk for cancer of the pelvic organs (ovaries, uterus, and vagina) and who do not have symptoms. Ask your health care provider if a  screening pelvic exam is right for you.  If you have had past treatment for cervical cancer or a condition that could lead to cancer, you need Pap tests and screening for cancer for at least 20 years after your treatment. If Pap tests have been discontinued, your risk factors (such as having a new sexual partner) need to be reassessed to determine if screening should be resumed. Some women have medical problems that increase the chance of getting cervical cancer. In these cases, your health care provider may recommend more frequent screening and Pap tests.    Colorectal cancer can be detected and often prevented. Most routine colorectal cancer screening begins at the age of 100 years and continues through age 42 years. However, your health care provider may recommend screening at an earlier age if you have risk factors for colon cancer. On a yearly basis, your health care provider may provide home test kits to check for hidden blood in the stool. Use of a small camera at the end of a tube, to directly examine the colon (sigmoidoscopy or colonoscopy), can detect the earliest forms of colorectal cancer. Talk to your health care provider about this at age 8, when routine screening begins. Direct exam of the colon should be repeated every 5-10 years through age 30 years, unless early forms of pre-cancerous polyps or small growths are found.  Osteoporosis is a disease in which the bones lose minerals and strength with aging. This can result in serious bone fractures or breaks. The risk of osteoporosis can be identified using a bone density scan. Women ages 53 years and over and women at risk for fractures or osteoporosis should discuss screening with their health care providers. Ask your health care provider whether you should take a calcium supplement or vitamin D to reduce the rate of osteoporosis.  Menopause can be associated with physical symptoms and risks. Hormone replacement therapy is available to  decrease symptoms and risks. You should talk to your health care provider about whether hormone replacement therapy is right for you.  Use sunscreen. Apply sunscreen liberally and repeatedly throughout the day. You should seek shade when your shadow is shorter than you. Protect yourself by wearing long sleeves, pants, a wide-brimmed hat, and sunglasses year round, whenever you are outdoors.  Once a month, do a whole body skin exam, using a mirror to look at the skin on your back. Tell your health care provider of new moles, moles that have irregular borders, moles that are larger than a pencil eraser, or moles that have changed in shape or color.  Stay current with required vaccines (immunizations).  Influenza vaccine. All adults should be immunized every  year.  Tetanus, diphtheria, and acellular pertussis (Td, Tdap) vaccine. Pregnant women should receive 1 dose of Tdap vaccine during each pregnancy. The dose should be obtained regardless of the length of time since the last dose. Immunization is preferred during the 27th-36th week of gestation. An adult who has not previously received Tdap or who does not know her vaccine status should receive 1 dose of Tdap. This initial dose should be followed by tetanus and diphtheria toxoids (Td) booster doses every 10 years. Adults with an unknown or incomplete history of completing a 3-dose immunization series with Td-containing vaccines should begin or complete a primary immunization series including a Tdap dose. Adults should receive a Td booster every 10 years.    Zoster vaccine. One dose is recommended for adults aged 67 years or older unless certain conditions are present.    Pneumococcal 13-valent conjugate (PCV13) vaccine. When indicated, a person who is uncertain of her immunization history and has no record of immunization should receive the PCV13 vaccine. An adult aged 37 years or older who has certain medical conditions and has not been previously  immunized should receive 1 dose of PCV13 vaccine. This PCV13 should be followed with a dose of pneumococcal polysaccharide (PPSV23) vaccine. The PPSV23 vaccine dose should be obtained at least 8 weeks after the dose of PCV13 vaccine. An adult aged 40 years or older who has certain medical conditions and previously received 1 or more doses of PPSV23 vaccine should receive 1 dose of PCV13. The PCV13 vaccine dose should be obtained 1 or more years after the last PPSV23 vaccine dose.    Pneumococcal polysaccharide (PPSV23) vaccine. When PCV13 is also indicated, PCV13 should be obtained first. All adults aged 68 years and older should be immunized. An adult younger than age 53 years who has certain medical conditions should be immunized. Any person who resides in a nursing home or long-term care facility should be immunized. An adult smoker should be immunized. People with an immunocompromised condition and certain other conditions should receive both PCV13 and PPSV23 vaccines. People with human immunodeficiency virus (HIV) infection should be immunized as soon as possible after diagnosis. Immunization during chemotherapy or radiation therapy should be avoided. Routine use of PPSV23 vaccine is not recommended for American Indians, [place] Natives, or people younger than 65 years unless there are medical conditions that require PPSV23 vaccine. When indicated, people who have unknown immunization and have no record of immunization should receive PPSV23 vaccine. One-time revaccination 5 years after the first dose of PPSV23 is recommended for people aged 19-64 years who have chronic kidney failure, nephrotic syndrome, asplenia, or immunocompromised conditions. People who received 1-2 doses of PPSV23 before age 34 years should receive another dose of PPSV23 vaccine at age 8 years or later if at least 5 years have passed since the previous dose. Doses of PPSV23 are not needed for people immunized with PPSV23 at or after  age 21 years.   Preventive Services / Frequency  Ages 73 years and over  Blood pressure check.  Lipid and cholesterol check.  Lung cancer screening. / Every year if you are aged 64-80 years and have a 30-pack-year history of smoking and currently smoke or have quit within the past 15 years. Yearly screening is stopped once you have quit smoking for at least 15 years or develop a health problem that would prevent you from having lung cancer treatment.  Clinical breast exam.** / Every year after age 36 years.  BRCA-related cancer risk assessment.** /  For women who have family members with a BRCA-related cancer (breast, ovarian, tubal, or peritoneal cancers).  Mammogram.** / Every year beginning at age 76 years and continuing for as long as you are in good health. Consult with your health care provider.  Pap test.** / Every 3 years starting at age 61 years through age 5 or 74 years with 3 consecutive normal Pap tests. Testing can be stopped between 65 and 70 years with 3 consecutive normal Pap tests and no abnormal Pap or HPV tests in the past 10 years.  Fecal occult blood test (FOBT) of stool. / Every year beginning at age 71 years and continuing until age 80 years. You may not need to do this test if you get a colonoscopy every 10 years.  Flexible sigmoidoscopy or colonoscopy.** / Every 5 years for a flexible sigmoidoscopy or every 10 years for a colonoscopy beginning at age 52 years and continuing until age 77 years.  Hepatitis C blood test.** / For all people born from [birth year] through 1963 and any individual with known risks for hepatitis C.  Osteoporosis screening.** / A one-time screening for women ages 38 years and over and women at risk for fractures or osteoporosis.  Skin self-exam. / Monthly.  Influenza vaccine. / Every year.  Tetanus, diphtheria, and acellular pertussis (Tdap/Td) vaccine.** / 1 dose of Td every 10 years.  Zoster vaccine.** / 1 dose for adults aged 87 years  or older.  Pneumococcal 13-valent conjugate (PCV13) vaccine.** / Consult your health care provider.  Pneumococcal polysaccharide (PPSV23) vaccine.** / 1 dose for all adults aged 54 years and older. Screening for abdominal aortic aneurysm (AAA)  by ultrasound is recommended for people who have history of high blood pressure or who are current or former smokers.

## 2014-09-02 NOTE — Progress Notes (Signed)
Patient ID: Debra White, female   DOB: [birth year], 81 y.o.   MRN: [id number]  Annual Comprehensive Examination  This very nice 81 y.o. MWF presents for complete physical.  Patient has been followed for HTN, Prediabetes, Hyperlipidemia, and Vitamin D Deficiency. Patient continues to report facial pains related to prior facial trauma and also discussed feelings of depression related to her chronic pains. Denies suicidal ideations.     HTN predates since     . Patient's BP has been controlled at home and patient denies any cardiac symptoms as chest pain, palpitations, shortness of breath, dizziness or ankle swelling. Today's BP: 140/62 mmHg    Patient's hyperlipidemia is controlled with diet and medications. Patient denies myalgias or other medication SE's. Last lipids were at goal -  Total Chol 149; HDL 72; LDL 51; Trig 128 on 05/24/2014.   Patient has prediabetes predating since 2011 with an A1c 6.5%  and patient denies reactive hypoglycemic symptoms, visual blurring, diabetic polys, or paresthesias. Last A1c was  5.7% on 05/24/2014.   Finally, patient has history of Vitamin D Deficiency and last Vitamin D was  61 on 05/24/2014.  Medication Sig  . acetaminophen (TYLENOL ARTHRITIS PAIN) 650 MG CR tablet Take 650 mg by mouth every 8 (eight) hours as needed for pain.  Marland Kitchen atenolol (TENORMIN) 100 MG tablet Take 1 tablet (100 mg total) by mouth every morning.  Marland Kitchen azelastine (ASTELIN) 0.1 % nasal spray Use 1 to 2 sprays each nostril 2 x day - morning & night for nasal drainage & congestion  . VITAMIN D 1000 UNITS tablet Take 2,000 Units by mouth daily.  . clonazePAM (KLONOPIN) 1 MG tablet TAKE 1/2-1 TABLET BY MOUTH UP TO 3 TIMESDAILY IF NEEDED FOR NERVES  . gabapentin (NEURONTIN) 100 MG capsule Take 2 caps in AM, 1 cap at 2 pm, 2 caps in PM  . hyoscyamine (LEVSIN SL) 0.125 MG SL tablet Dissolve one tablet by mouth every 4-6 hours as needed  . lidocaine (XYLOCAINE) 5 % ointment Apply 1 application topically  2 (two) times daily as needed.  Marland Kitchen losartan (COZAAR) 100 MG tablet Take 0.5 tablets (50 mg total) by mouth daily.  . pravastatin (PRAVACHOL) 40 MG tablet Take 1 tablet (40 mg total) by mouth daily.  . Probiotic Product (FLORAJEN3 PO) Take by mouth daily.  . Simethicone (GAS RELIEF PO) Take by mouth daily. OTC   Allergies  Allergen Reactions  . Latex Hives    TONGUE HAD BLISTERS WHEN HAD DENTAL SURGERY  . Amoxicillin Nausea And Vomiting  . Chlorhexidine Gluconate Itching  . Aspirin     Gi upset  . Barbiturates   . Celebrex [Celecoxib]   . Codeine Itching  . Evista [Raloxifene]   . Lipitor [Atorvastatin]   . Morphine And Related   . Nsaids   . Oruvail [Ketoprofen]   . Paraffin   . Prochlorperazine Other (See Comments)    Uncontrolled shaking  . Proloprim [Trimethoprim]   . Vibra-Tab [Doxycycline]   . Vioxx [Rofecoxib]     edema  . Betadine [Povidone Iodine] Rash  . Caffeine Palpitations   Past Medical History  Diagnosis Date  . Fibromyalgia   . Anxiety   . Dysrhythmia     RX  . Blood transfusion   . Chronic kidney disease     STONES  . Headache(784.0)   . Arthritis   . Cataract   . Diverticulosis of colon (without mention of hemorrhage) 2007    Colonoscopy  . Candida esophagitis  2013    EGD  . Family history of colon cancer     sister  . Hypertension   . Hyperlipemia   . GERD (gastroesophageal reflux disease)   . Emphysema of lung    Health Maintenance  Topic Date Due  . ZOSTAVAX  12/30/1995  . PNA vac Low Risk Adult (2 of 2 - PCV13) 06/26/2003  . TETANUS/TDAP  06/25/2013  . INFLUENZA VACCINE  01/24/2015  . COLONOSCOPY  03/17/2017  . DEXA SCAN  Completed   Immunization History  Administered Date(s) Administered  . Influenza, High Dose Seasonal PF 03/25/2014  . Influenza-Unspecified 03/09/2013  . Pneumococcal Polysaccharide-23 06/25/2002  . Td 06/26/2003   Past Surgical History  Procedure Laterality Date  . Abdominal hysterectomy    . Hand surgery       BIL   . Shoulder arthroscopy w/ rotator cuff repair      LFT  . Cervical disc surgery    . Back surgery      X2  . Orif tripod fracture  07/13/2011    Procedure: OPEN REDUCTION INTERNAL FIXATION (ORIF) TRIPOD FRACTURE;  Surgeon: Tyson Alias, MD;  Location: [facility];  Service: ENT;  Laterality: Right;  ORIF RIGHT ZYGOMA, ORBITAL FLOOR EXPLORATION WITH FROST STITCH (TEMPORARY TARSORRHAPHY)  . Colonoscopy    . Facial fracture surgery    . Back surgery     Family History  Problem Relation Age of Onset  . Heart disease Mother   . Breast cancer Sister   . Colon cancer Sister   . Cancer Sister     breast  . Cancer Sister     colon  . Cancer Sister     colon  . Heart disease Father   . Hypertension Daughter   . Hyperlipidemia Daughter    History  Substance Use Topics  . Smoking status: Never Smoker   . Smokeless tobacco: Never Used  . Alcohol Use: No    ROS Constitutional: Denies fever, chills, weight loss/gain, headaches, insomnia, fatigue, night sweats, and change in appetite. Eyes: Denies redness, blurred vision, diplopia, discharge, itchy, watery eyes.  ENT: Denies discharge, congestion, post nasal drip, epistaxis, sore throat, earache, hearing loss, dental pain, Tinnitus, Vertigo, Sinus pain, snoring.  Cardio: Denies chest pain, palpitations, irregular heartbeat, syncope, dyspnea, diaphoresis, orthopnea, PND, claudication, edema Respiratory: denies cough, dyspnea, DOE, pleurisy, hoarseness, laryngitis, wheezing.  Gastrointestinal: Denies dysphagia, heartburn, reflux, water brash, pain, cramps, nausea, vomiting, bloating, diarrhea, constipation, hematemesis, melena, hematochezia, jaundice, hemorrhoids Genitourinary: Denies dysuria, frequency, urgency, nocturia, hesitancy, discharge, hematuria, flank pain Breast: Breast lumps, nipple discharge, bleeding.  Musculoskeletal: Denies arthralgia, myalgia, stiffness, Jt. Swelling, pain, limp, and strain/sprain. Denies  falls. Skin: Denies puritis, rash, hives, warts, acne, eczema, changing in skin lesion Neuro: No weakness, tremor, incoordination, spasms, paresthesia, pain Psychiatric: Denies confusion, memory loss, sensory loss. Denies Depression. Endocrine: Denies change in weight, skin, hair change, nocturia, and paresthesia, diabetic polys, visual blurring, hyper / hypo glycemic episodes.  Heme/Lymph: No excessive bleeding, bruising, enlarged lymph nodes.  Physical Exam  BP 140/62   Pulse 60  Temp 97.7 F   Resp 16  Ht 5' 3.5"   Wt 105 lb     BMI 18.31   General Appearance: Well nourished and in no apparent distress. Eyes: PERRLA, EOMs, conjunctiva no swelling or erythema, normal fundi and vessels. Sinuses: No frontal/maxillary tenderness ENT/Mouth: EACs patent / TMs  nl. Nares clear without erythema, swelling, mucoid exudates. Oral hygiene is good. No erythema, swelling, or exudate.  Tongue normal, non-obstructing. Tonsils not swollen or erythematous. Hearing normal.  Neck: Supple, thyroid normal. No bruits, nodes or JVD. Respiratory: Respiratory effort normal.  BS equal and clear bilateral without rales, rhonci, wheezing or stridor. Cardio: Heart sounds are normal with regular rate and rhythm and no murmurs, rubs or gallops. Peripheral pulses are normal and equal bilaterally without edema. No aortic or femoral bruits. Chest: symmetric with normal excursions and percussion. Breasts: Symmetric, without lumps, nipple discharge, retractions, or fibrocystic changes.  Abdomen: Flat, soft, with bowl sounds. Nontender, no guarding, rebound, hernias, masses, or organomegaly.  Lymphatics: Non tender without lymphadenopathy.  Genitourinary:  Musculoskeletal: Full ROM all peripheral extremities, joint stability, 5/5 strength, and normal gait. Skin: Warm and dry without rashes, lesions, cyanosis, clubbing or  ecchymosis.  Neuro: Cranial nerves intact, reflexes equal bilaterally. Normal muscle tone, no  cerebellar symptoms. Sensation intact.  Pysch: Awake and oriented X 3, normal affect, Insight and Judgment appropriate.   Assessment and Plan  1. Essential hypertension  - Microalbumin / creatinine urine ratio - EKG 12-Lead - [place], RETROPERITNL ABD,  LTD - TSH  2. Hyperlipemia  - Lipid panel  3. Prediabetes  - Hemoglobin A1c - Insulin, fasting  4. Vitamin D deficiency  - Vit D  25 hydroxy (rtn osteoporosis monitoring)  5. Chronic post-traumatic headache   6. Atypical facial pain  - increase dose Gabapentin to help facial neuropathic pains  - gabapentin (NEURONTIN) 300 MG capsule; Take 1 capsule 3 to 4 x day for facial pain  Dispense: 120 capsule; Refill: 5  7. Screening for rectal cancer  - POC Hemoccult Bld/Stl (3-Cd Home Screen); Future  8. Depression screen  - Screen (+)  9. At low risk for fall   10. Medication management  - Urine Microscopic - CBC with Differential/Platelet - BASIC METABOLIC PANEL WITH GFR - Hepatic function panel - Magnesium  11. Depression  - citalopram (CELEXA) 20 MG tablet; Take 1/2 to 1 tablet daily as directed for mood  Dispense: 90 tablet; Refill: 99 - ROV - 1 month or prn - discussed meds/SE's.   Continue prudent diet as discussed, weight control, BP monitoring, regular exercise, and medications. Discussed med's effects and SE's. Screening labs and tests as requested with regular follow-up as recommended.

## 2014-09-03 LAB — LIPID PANEL
CHOLESTEROL: 148 mg/dL (ref 0–200)
HDL: 63 mg/dL (ref >=46)
LDL Cholesterol: 51 mg/dL (ref 0–99)
Total CHOL/HDL Ratio: 2.3 Ratio
Triglycerides: 172 mg/dL — ABNORMAL HIGH (ref <150)
VLDL: 34 mg/dL (ref 0–40)

## 2014-09-03 LAB — BASIC METABOLIC PANEL WITH GFR
BUN: 10 mg/dL (ref 6–23)
CHLORIDE: 97 meq/L (ref 96–112)
CO2: 30 mEq/L (ref 19–32)
CREATININE: 0.73 mg/dL (ref 0.50–1.10)
Calcium: 9 mg/dL (ref 8.4–10.5)
GFR, Est Non African American: 79 mL/min
Glucose, Bld: 84 mg/dL (ref 70–99)
Potassium: 4.4 mEq/L (ref 3.5–5.3)
Sodium: 136 mEq/L (ref 135–145)

## 2014-09-03 LAB — URINALYSIS, MICROSCOPIC ONLY
Bacteria, UA: NONE SEEN
Casts: NONE SEEN
Crystals: NONE SEEN
Squamous Epithelial / LPF: NONE SEEN

## 2014-09-03 LAB — MICROALBUMIN / CREATININE URINE RATIO
CREATININE, URINE: 36.6 mg/dL
MICROALB UR: 0.5 mg/dL (ref <2.0)
MICROALB/CREAT RATIO: 13.7 mg/g (ref 0.0–30.0)

## 2014-09-03 LAB — HEPATIC FUNCTION PANEL
ALBUMIN: 4.5 g/dL (ref 3.5–5.2)
ALT: 16 U/L (ref 0–35)
AST: 22 U/L (ref 0–37)
Alkaline Phosphatase: 62 U/L (ref 39–117)
BILIRUBIN INDIRECT: 0.3 mg/dL (ref 0.2–1.2)
Bilirubin, Direct: 0.1 mg/dL (ref 0.0–0.3)
TOTAL PROTEIN: 7 g/dL (ref 6.0–8.3)
Total Bilirubin: 0.4 mg/dL (ref 0.2–1.2)

## 2014-09-03 LAB — HEMOGLOBIN A1C
Hgb A1c MFr Bld: 6 % — ABNORMAL HIGH (ref <5.7)
MEAN PLASMA GLUCOSE: 126 mg/dL — AB (ref <117)

## 2014-09-03 LAB — TSH: TSH: 0.96 u[IU]/mL (ref 0.350–4.500)

## 2014-09-03 LAB — VITAMIN D 25 HYDROXY (VIT D DEFICIENCY, FRACTURES): VIT D 25 HYDROXY: 52 ng/mL (ref 30–100)

## 2014-09-03 LAB — INSULIN, FASTING: Insulin fasting, serum: 2.6 u[IU]/mL (ref 2.0–19.6)

## 2014-09-03 LAB — MAGNESIUM: Magnesium: 1.8 mg/dL (ref 1.5–2.5)

## 2014-09-07 ENCOUNTER — Telehealth: Payer: Self-pay | Admitting: *Deleted

## 2014-09-07 NOTE — Telephone Encounter (Signed)
Patient called and states she is taking Gabapentin 300 mg tid and is having nausea.  Per Dr [name], reduce dose to 300 mg bid and take with a nausea med and with food .  Patient aware.

## 2014-09-20 ENCOUNTER — Other Ambulatory Visit (INDEPENDENT_AMBULATORY_CARE_PROVIDER_SITE_OTHER): Payer: PPO

## 2014-09-20 DIAGNOSIS — Z1212 Encounter for screening for malignant neoplasm of rectum: Secondary | ICD-10-CM

## 2014-09-20 LAB — POC HEMOCCULT BLD/STL (HOME/3-CARD/SCREEN)
Card #2 Fecal Occult Blod, POC: NEGATIVE
FECAL OCCULT BLD: NEGATIVE
Fecal Occult Blood, POC: NEGATIVE

## 2014-10-04 ENCOUNTER — Encounter: Payer: Self-pay | Admitting: Internal Medicine

## 2014-10-04 ENCOUNTER — Ambulatory Visit: Payer: Self-pay | Admitting: Internal Medicine

## 2014-10-04 ENCOUNTER — Ambulatory Visit (INDEPENDENT_AMBULATORY_CARE_PROVIDER_SITE_OTHER): Payer: PPO | Admitting: Internal Medicine

## 2014-10-04 VITALS — BP 122/66 | HR 64 | Temp 97.7°F | Resp 16 | Ht 64.5 in | Wt 104.0 lb

## 2014-10-04 DIAGNOSIS — K2100 Gastro-esophageal reflux disease with esophagitis, without bleeding: Secondary | ICD-10-CM

## 2014-10-04 DIAGNOSIS — G501 Atypical facial pain: Secondary | ICD-10-CM

## 2014-10-04 DIAGNOSIS — I10 Essential (primary) hypertension: Secondary | ICD-10-CM

## 2014-10-04 MED ORDER — RANITIDINE HCL 300 MG PO TABS: ORAL_TABLET | ORAL | Status: DC

## 2014-10-04 MED ORDER — GABAPENTIN 100 MG PO CAPS: ORAL_CAPSULE | ORAL | Status: DC

## 2014-10-04 NOTE — Progress Notes (Signed)
Subjective:    Patient ID: Debra White, female    DOB: August 10, 1933, 81 y.o.   MRN: [id number]  HPI patient returns for 1 mo f/u of increasing her Gabapentin from 100-200 mg 2-3 x/da up to 300 mg 3 x /da  for her atypical facial pain relating to prior orbital fx's and was unable to tolerate the full dose. Also she had been on Klonopin tid and had been started on Citalopram in hopes of tapering her Klonopin. She reports feeling more relaxed and able to taper her Klonopin some. Continues to report anorexia and dysgeusia related to her prior facial fx's.  Medication Sig  . acetaminophen (TYLENOL ARTHRITIS PAIN) 650 MG CR tablet Take 650 mg by mouth every 8 (eight) hours as needed for pain.  Marland Kitchen atenolol (TENORMIN) 100 MG tablet Take 1 tablet (100 mg total) by mouth every morning.  Marland Kitchen azelastine (ASTELIN) 0.1 % nasal spray Use 1 to 2 sprays each nostril 2 x day - morning & night for nasal drainage & congestion  . cholecalciferol (VITAMIN D) 1000 UNITS tablet Take 2,000 Units by mouth daily.  . citalopram (CELEXA) 20 MG tablet Take 1/2  tablet daily as directed for mood  . clonazePAM (KLONOPIN) 1 MG tablet TAKE 1/2-1 TABLET BY MOUTH UP TO 3 TIMES DAILY IF NEEDED FOR NERVES  . hyoscyamine (LEVSIN SL) 0.125 MG SL tablet Dissolve one tablet by mouth every 4-6 hours as needed  . lidocaine (XYLOCAINE) 5 % ointment Apply 1 application topically 2 (two) times daily as needed.  Marland Kitchen losartan (COZAAR) 100 MG tablet Take 0.5 tablets (50 mg total) by mouth daily.  . pravastatin (PRAVACHOL) 40 MG tablet Take 1 tablet (40 mg total) by mouth daily.  . Probiotic Product (FLORAJEN3 PO) Take by mouth daily.  . Simethicone (GAS RELIEF PO) Take by mouth daily. OTC  . gabapentin (NEURONTIN) 100 mg & 300 MG capsule Taking 100 mg x 2 tid .   Allergies  Allergen Reactions  . Latex Hives    TONGUE HAD BLISTERS WHEN HAD DENTAL SURGERY  . Amoxicillin Nausea And Vomiting  . Chlorhexidine Gluconate Itching  . Aspirin     Gi  upset  . Barbiturates   . Celebrex [Celecoxib]   . Codeine Itching  . Evista [Raloxifene]   . Lipitor [Atorvastatin]   . Morphine And Related   . Nsaids   . Oruvail [Ketoprofen]   . Paraffin   . Prochlorperazine Other (See Comments)    Uncontrolled shaking  . Proloprim [Trimethoprim]   . Vibra-Tab [Doxycycline]   . Vioxx [Rofecoxib]     edema  . Betadine [Povidone Iodine] Rash  . Caffeine Palpitations   Past Medical History  Diagnosis Date  . Fibromyalgia   . Anxiety   . Dysrhythmia     RX  . Blood transfusion   . Chronic kidney disease     STONES  . Headache(784.0)   . Arthritis   . Cataract   . Diverticulosis of colon (without mention of hemorrhage) 2007    Colonoscopy  . Candida esophagitis 2013    EGD  . Family history of colon cancer     sister  . Hypertension   . Hyperlipemia   . GERD (gastroesophageal reflux disease)   . Emphysema of lung    Past Surgical History  Procedure Laterality Date  . Abdominal hysterectomy    . Hand surgery      BIL   . Shoulder arthroscopy w/ rotator cuff repair  LFT  . Cervical disc surgery    . Back surgery      X2  . Orif tripod fracture  07/13/2011    Procedure: OPEN REDUCTION INTERNAL FIXATION (ORIF) TRIPOD FRACTURE;  Surgeon: Tyson Alias, MD;  Location: [facility];  Service: ENT;  Laterality: Right;  ORIF RIGHT ZYGOMA, ORBITAL FLOOR EXPLORATION WITH FROST STITCH (TEMPORARY TARSORRHAPHY)  . Colonoscopy    . Facial fracture surgery    . Back surgery     Review of Systems In addition to the HPI above,  No Fever-chills,  No Headache, No changes with Vision or hearing,  No problems swallowing food or Liquids,  but c/o anorexia/dysguesia as above No Chest pain or productive Cough or Shortness of Breath,  C/o EG heart burn and waterbrash, No Abdominal pain, No Nausea or Vomitting, Bowel movements are regular,  No Blood in stool or Urine,  No dysuria,  No new skin rashes or bruises,  No new joints pains-aches,  No  new weakness, tingling, numbness in any extremity,  No recent weight loss,  No polyuria, polydypsia or polyphagia,  No significant Mental Stressors.  A full 10 point Review of Systems was done, except as stated above, all other Review of Systems were negative     Objective:   Physical Exam  BP 122/66 mmHg  Pulse 64  Temp(Src) 97.7 F (36.5 C)  Resp 16  Ht 5' 4.5" (1.638 m)  Wt 104 lb (47.174 kg)  BMI 17.58 kg/m2  HEENT - Eac's patent. TM's Nl. EOM's full. PERRLA. NasoOroPharynx clear. Neck - supple. Nl Thyroid. Carotids 2+ & No bruits, nodes, JVD Chest - Clear equal BS w/o Rales, rhonchi, wheezes. Cor - Nl HS. RRR w/o sig MGR. PP 1(+). No edema. Abd - No palpable organomegaly, masses or tenderness. BS nl. MS- FROM w/o deformities. Muscle power, tone and bulk Nl. Gait Nl. Neuro - No obvious Cr N abnormalities. Sensory, motor and Cerebellar functions appear Nl w/o focal abnormalities. Psyche - Mental status normal & appropriate.  No delusions, ideations or obvious mood abnormalities.    Assessment & Plan:   1. Essential hypertension   2. Atypical facial pain  - gabapentin (NEURONTIN) 100 MG capsule; Take 2 to 3 capsules 3 x daily for facial pain  Dispense: 90 capsule; Refill: 5  3. Gastroesophageal reflux disease with esophagitis  - ranitidine (ZANTAC) 300 MG tablet; Take 1 tablet 1 to 2 x day as needed for acid indigestion & redlux  Dispense: 180 tablet; Refill: 99  - recc try gradually increase dose Gabapentin and also recc to increase her Citalopram dose from 1/2 tab = 10 mg to a whole 20 mg tab.  - Discussed meds/SE's

## 2014-10-13 ENCOUNTER — Ambulatory Visit: Payer: PPO | Attending: Orthopedic Surgery | Admitting: Physical Therapy

## 2014-10-13 DIAGNOSIS — M2560 Stiffness of unspecified joint, not elsewhere classified: Secondary | ICD-10-CM | POA: Diagnosis not present

## 2014-10-13 DIAGNOSIS — R269 Unspecified abnormalities of gait and mobility: Secondary | ICD-10-CM | POA: Diagnosis not present

## 2014-10-13 DIAGNOSIS — R293 Abnormal posture: Secondary | ICD-10-CM | POA: Diagnosis not present

## 2014-10-13 DIAGNOSIS — M545 Low back pain, unspecified: Secondary | ICD-10-CM

## 2014-10-13 DIAGNOSIS — M25511 Pain in right shoulder: Secondary | ICD-10-CM

## 2014-10-13 DIAGNOSIS — R29898 Other symptoms and signs involving the musculoskeletal system: Secondary | ICD-10-CM | POA: Diagnosis not present

## 2014-10-13 DIAGNOSIS — M5386 Other specified dorsopathies, lumbar region: Secondary | ICD-10-CM

## 2014-10-13 NOTE — Therapy (Signed)
[place], [place], [address] Phone: [phone]   Fax:  [phone]  Physical Therapy Evaluation  Patient Details  Name: Debra White MRN: [id number] Date of Birth: 1934/04/21 Referring Provider:  Newt Minion, MD  Encounter Date: 10/13/2014      PT End of Session - 10/13/14 1359    Visit Number 1   Number of Visits 16   Date for PT Re-Evaluation 12/13/14   PT Start Time 1015   PT Stop Time 1104   PT Time Calculation (min) 49 min   Activity Tolerance Patient tolerated treatment well;Patient limited by pain   Behavior During Therapy [facility] for tasks assessed/performed      Past Medical History  Diagnosis Date  . Fibromyalgia   . Anxiety   . Dysrhythmia     RX  . Blood transfusion   . Chronic kidney disease     STONES  . Headache(784.0)   . Arthritis   . Cataract   . Diverticulosis of colon (without mention of hemorrhage) 2007    Colonoscopy  . Candida esophagitis 2013    EGD  . Family history of colon cancer     sister  . Hypertension   . Hyperlipemia   . GERD (gastroesophageal reflux disease)   . Emphysema of lung     Past Surgical History  Procedure Laterality Date  . Abdominal hysterectomy    . Hand surgery      BIL   . Shoulder arthroscopy w/ rotator cuff repair      LFT  . Cervical disc surgery    . Back surgery      X2  . Orif tripod fracture  07/13/2011    Procedure: OPEN REDUCTION INTERNAL FIXATION (ORIF) TRIPOD FRACTURE;  Surgeon: Tyson Alias, MD;  Location: [facility];  Service: ENT;  Laterality: Right;  ORIF RIGHT ZYGOMA, ORBITAL FLOOR EXPLORATION WITH FROST STITCH (TEMPORARY TARSORRHAPHY)  . Colonoscopy    . Facial fracture surgery    . Back surgery      There were no vitals filed for this visit.  Visit Diagnosis:  Bilateral low back pain without sciatica - Plan: PT plan of care cert/re-cert  Decreased ROM of lumbar spine - Plan: PT plan of care  cert/re-cert  Right shoulder pain - Plan: PT plan of care cert/re-cert  Weakness of shoulder - Plan: PT plan of care cert/re-cert  Abnormality of gait - Plan: PT plan of care cert/re-cert  Abnormal posture - Plan: PT plan of care cert/re-cert      Subjective Assessment - 10/13/14 1023    Subjective pt is a 81 y.o F with CC of low back pain with referral of symptoms to the lower gluteal area Bil. she reports having back problems for over 50 years, but reports increased pain the low back after falling 3 years ago.  she states the low back has gotten a little better since onset.  She states she has r shoulder pain that is worse today but got a shot in it.   Limitations Sitting;Lifting;Standing;Walking;House hold activities   How long can you sit comfortably? 30 min   How long can you stand comfortably? >1 hour   How long can you walk comfortably? > 1 hour   Diagnostic tests MRI 2 months ago compression fx of L2, with spinal fusion and OA   Patient Stated Goals to get rid of some pain, to get back to cleaning the house   Currently  in Pain? Yes   Pain Score 4    Pain Location Back   Pain Orientation Right;Left;Mid;Lower  from top of hips   Pain Descriptors / Indicators Aching;Sore;Constant   Pain Type Chronic pain   Pain Onset More than a month ago   Pain Frequency Constant   Aggravating Factors  hurts constantly, bending forward, prolonged walking/ standing    Pain Relieving Factors heating, topical ointment   Effect of Pain on Daily Activities limited endurance with walking/ standing/ sitting.   Multiple Pain Sites Yes   Pain Score 5  last night 8/10   Pain Location Shoulder   Pain Orientation Right   Pain Descriptors / Indicators Aching;Stabbing;Shooting   Pain Type Chronic pain   Pain Onset More than a month ago   Pain Frequency Constant   Aggravating Factors  moving the shoulder, laying on it, lifting and carrying.    Pain Relieving Factors topical pain ointment, Tylenol for  pain, heat   Effect of Pain on Daily Activities weakness with lifting, pain during AROM, difficuflty             [facility] PT Assessment - 10/13/14 1036    Assessment   Medical Diagnosis low back pain, and R shoulder pain   Onset Date --  over 3 years   Next MD Visit --   in one month   Prior Therapy yes  for low back    Precautions   Precautions None   Restrictions   Weight Bearing Restrictions No   Balance Screen   Has the patient fallen in the past 6 months No   Has the patient had a decrease in activity level because of a fear of falling?  No   Is the patient reluctant to leave their home because of a fear of falling?  No   Home Environment   Living Enviornment Private residence   Living Arrangements Spouse/significant other   Available Help at Discharge Available 24 hours/day   Type of [place] entrance  48ft   Home Layout One level   [place] - single point;Walker - 2 wheels   Prior Function   Level of Independence Independent with basic ADLs;Independent with homemaking with ambulation;Independent with gait;Independent with transfers   Vocation Retired   Leisure walking   Cognition   Overall Cognitive Status Within Functional Limits for tasks assessed   Observation/Other Assessments   Focus on Therapeutic Outcomes (FOTO)  60% limitation   predicted 46%    Posture/Postural Control   Posture/Postural Control Postural limitations   Postural Limitations Rounded Shoulders;Forward head;Decreased lumbar lordosis   ROM / Strength   AROM / PROM / Strength AROM;PROM;Strength   AROM   AROM Assessment Site Lumbar;Shoulder   Right/Left Shoulder Right;Left   Right Shoulder Flexion 158 Degrees   Right Shoulder ABduction 170 Degrees   Right Shoulder Internal Rotation 100 Degrees   Right Shoulder External Rotation 88 Degrees   Left Shoulder Flexion 160 Degrees   Left Shoulder ABduction 180 Degrees   Left Shoulder Internal Rotation 108  Degrees   Left Shoulder External Rotation 78 Degrees   Lumbar Flexion 80   Lumbar Extension 20   Lumbar - Right Side Bend 30   Lumbar - Left Side Bend 15   PROM   PROM Assessment Site Shoulder   Strength   Overall Strength Comments overall strength 3+/5 with pain during R abduction MMT   Strength Assessment Site Shoulder   Right/Left Shoulder  Right;Left   Palpation   Palpation tenderness at the R middle deltoid with referral of symptoms to mid humerus.  Tenderness at bilateral paraspinals, glutes, and PSIS with specific tenderness around scar near the R PSIS.    Hawkins-Kennedy test   Findings Positive   Empty Can test   Findings Positive   Side Left   Full Can test   Findings Positive   Side Left   Painful Arc of Motion   Findings Positive   Straight Leg Raise   Findings Negative                           PT Education - 10/13/14 1358    Education provided Yes   Education Details evaluation findings, POC, goals, HEP   Person(s) Educated Patient;Spouse   Methods Explanation   Comprehension Verbalized understanding;Returned demonstration          PT Short Term Goals - 10/13/14 1414    PT SHORT TERM GOAL #1   Title pt will be I with basic HEP 11/10/2014   Time 4   Period Weeks   Status New   PT SHORT TERM GOAL #2   Title pt will decrease R shoulder and low back pain to <4/10 during and following R UE use to assist with exercise progression 11/10/2014   Time 4   Period Weeks   Status New   PT SHORT TERM GOAL #3   Title pt will increase trunk mobility by > 5 degrees in all planes to assist with ADL's 11/10/2014   Time 4   Period Weeks   Status New   PT SHORT TERM GOAL #4   Title She will be able to verbalize and demonstrate techniques to control pain and inflammation for low back and R shoulder via Rest, Ice, and HEP 11/10/2014   Time 4   Period Weeks   Status New           PT Long Term Goals - 10/13/14 1549    PT LONG TERM GOAL #1    Title pt will be I with Advanced HEP 12/13/2014   Time 8   Period Weeks   Status New   PT LONG TERM GOAL #2   Title pt will increase R shoulder strength to >4/5 to assist with donning/doffing clothing and ADls 3/35/4562   Time 8   Period Weeks   Status New   PT LONG TERM GOAL #3   Title pt will Increase overall trunk mobility by > 20 degrees in all planes ot assist with safety during ambulation/driving 5/63/8937   Time 8   Period Weeks   Status New   PT LONG TERM GOAL #4   Title she will demonstrate pain <2/10 with shoulder and back to assist with ADLs 12/13/2014   Time 8   Period Weeks   Status New   PT LONG TERM GOAL #5   Title She will be able to verbalize and demonstrate techniques to reduce risk or reinjury via postural awareness and control, lifting and carrying mechanics, and HEP 12/13/2014   Time 8   Period Weeks   Status New               Plan - 10/13/14 1359    Clinical Impression Statement Emika presents to OPPT with CC of bil low back pain with referral of symptoms to her lower gluteal region and R shoulder pain from a fall about 3 years ago. Low  back presents with limited mobility with pain during L/R side bending, and during extension testing she reported feeling "unsteady".  She is tender upon palpation of bil upper glute max/medius, and at the R PSIS with a noticable scar which she reported is where she feels the most soreness.  Her R shoulder demonstrates Functional AROM compared bilaterally with weakness in bil shoulders rated at 3+/5, she is tender along the middle deltoid with referral of symptoms to the mid humerus. special testing was positive for [employer], and painfull arc possibly indicating RC pathology. She would benefit from skilled physical therapy to maximize her function and reduce pain by addressing the impairments listed.    Pt will benefit from skilled therapeutic intervention in order to improve on the following deficits Abnormal gait;Impaired  flexibility;Pain;Decreased knowledge of use of DME;Decreased scar mobility;Decreased mobility;Decreased coordination;Decreased activity tolerance;Decreased balance;Decreased endurance;Difficulty walking;Postural dysfunction;Increased muscle spasms;Decreased strength;Impaired perceived functional ability;Improper body mechanics;Decreased range of motion;Impaired UE functional use   Rehab Potential Good   PT Frequency 2x / week   PT Duration 8 weeks   PT Treatment/Interventions ADLs/Self Care Home Management;Electrical Stimulation;Therapeutic exercise;Patient/family education;Moist Heat;Balance training;Neuromuscular re-education;Manual techniques;Cryotherapy;Ultrasound;Therapeutic activities;Dry needling;Functional mobility training;Passive range of motion;Gait training   PT Next Visit Plan assess response to HEP, lower trunk mobilizations, modalites for pain PRN, R shoulder strengthening and scapular stability, berg balance test.    PT Home Exercise Plan internal/external rotation, lower trunk rotation, pelvic tilts, hamstring stretch,   Consulted and Agree with Plan of Care Patient          G-Codes - October 23, 2014 1412    Functional Assessment Tool Used FOTO 60% limitation   Functional Limitation Mobility: Walking and moving around   Mobility: Walking and Moving Around Current Status [phone]) At least 60 percent but less than 80 percent impaired, limited or restricted   Mobility: Walking and Moving Around Goal Status [phone]) At least 40 percent but less than 60 percent impaired, limited or restricted       Problem List Patient Active Problem List   Diagnosis Date Noted  . Prediabetes 01/27/2014  . Medication management 01/27/2014  . 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 Vitamin D deficiency 07/08/2013  . Hypertension   . Hyperlipemia   . GERD   . Headache, post-traumatic, chronic 09/20/2011  . Fracture, zygoma closed 07/13/2011   Starr Lake PT, DPT, LAT, ATC  Oct 23, 2014  5:34  PM    [facility] [address] [place], [place], [address] Phone: [phone]   Fax:  [phone]

## 2014-10-13 NOTE — Patient Instructions (Signed)
.  kris Tiffane Sheldon Balinda Quails PT, DPT, LAT, ATC  [facility] Phone: [phone]

## 2014-10-19 ENCOUNTER — Other Ambulatory Visit: Payer: Self-pay | Admitting: Physician Assistant

## 2014-10-19 MED ORDER — CLONAZEPAM 1 MG PO TABS: ORAL_TABLET | ORAL | Status: DC

## 2014-10-24 ENCOUNTER — Other Ambulatory Visit: Payer: Self-pay | Admitting: Internal Medicine

## 2014-10-26 ENCOUNTER — Ambulatory Visit: Payer: PPO | Attending: Orthopedic Surgery | Admitting: Physical Therapy

## 2014-10-26 DIAGNOSIS — R269 Unspecified abnormalities of gait and mobility: Secondary | ICD-10-CM | POA: Diagnosis not present

## 2014-10-26 DIAGNOSIS — M25511 Pain in right shoulder: Secondary | ICD-10-CM

## 2014-10-26 DIAGNOSIS — M545 Low back pain, unspecified: Secondary | ICD-10-CM

## 2014-10-26 DIAGNOSIS — R29898 Other symptoms and signs involving the musculoskeletal system: Secondary | ICD-10-CM | POA: Insufficient documentation

## 2014-10-26 DIAGNOSIS — R293 Abnormal posture: Secondary | ICD-10-CM | POA: Insufficient documentation

## 2014-10-26 DIAGNOSIS — M5386 Other specified dorsopathies, lumbar region: Secondary | ICD-10-CM

## 2014-10-26 DIAGNOSIS — M2560 Stiffness of unspecified joint, not elsewhere classified: Secondary | ICD-10-CM | POA: Insufficient documentation

## 2014-10-26 NOTE — Patient Instructions (Signed)

## 2014-10-26 NOTE — Therapy (Signed)
[place], [place], [address] Phone: [phone]   Fax:  [phone]  Physical Therapy Treatment  Patient Details  Name: Debra White MRN: [id number] Date of Birth: 1933-07-09 Referring Provider:  Unk Pinto, MD  Encounter Date: 10/26/2014      PT End of Session - 10/26/14 1236    Visit Number 2   Number of Visits 16   Date for PT Re-Evaluation 12/13/14   PT Start Time 1100   PT Stop Time 1200   PT Time Calculation (min) 60 min   Activity Tolerance Patient tolerated treatment well   Behavior During Therapy [facility] for tasks assessed/performed      Past Medical History  Diagnosis Date  . Fibromyalgia   . Anxiety   . Dysrhythmia     RX  . Blood transfusion   . Chronic kidney disease     STONES  . Headache(784.0)   . Arthritis   . Cataract   . Diverticulosis of colon (without mention of hemorrhage) 2007    Colonoscopy  . Candida esophagitis 2013    EGD  . Family history of colon cancer     sister  . Hypertension   . Hyperlipemia   . GERD (gastroesophageal reflux disease)   . Emphysema of lung     Past Surgical History  Procedure Laterality Date  . Abdominal hysterectomy    . Hand surgery      BIL   . Shoulder arthroscopy w/ rotator cuff repair      LFT  . Cervical disc surgery    . Back surgery      X2  . Orif tripod fracture  07/13/2011    Procedure: OPEN REDUCTION INTERNAL FIXATION (ORIF) TRIPOD FRACTURE;  Surgeon: Tyson Alias, MD;  Location: [facility];  Service: ENT;  Laterality: Right;  ORIF RIGHT ZYGOMA, ORBITAL FLOOR EXPLORATION WITH FROST STITCH (TEMPORARY TARSORRHAPHY)  . Colonoscopy    . Facial fracture surgery    . Back surgery      There were no vitals filed for this visit.  Visit Diagnosis:  Bilateral low back pain without sciatica  Decreased ROM of lumbar spine  Right shoulder pain  Weakness of shoulder  Abnormality of gait  Abnormal posture       Subjective Assessment - 10/26/14 1107    Subjective pt reports that she is doing ok today, and that she had some pain in her R shoulder the other day but is doing better today.    Currently in Pain? Yes   Pain Score 7    Pain Location Back   Pain Orientation Right;Left;Lower   Pain Descriptors / Indicators Aching   Pain Onset More than a month ago   Pain Frequency Constant   Pain Score 0   Pain Location Shoulder   Pain Orientation Right                         [facility] Adult PT Treatment/Exercise - 10/26/14 1110    Neck Exercises: Supine   Other Supine Exercise deep neck flexors 5 x 3 sec hold  with towel behind top of head to facilitate chin tuck.   Lumbar Exercises: Stretches   Passive Hamstring Stretch 2 reps;30 seconds   Lower Trunk Rotation 5 reps;20 seconds   Pelvic Tilt 5 reps;10 seconds   Lumbar Exercises: Aerobic   Stationary Bike Nu STep L1 x 5 min   Lumbar Exercises: Supine  Clam 10 reps;2 seconds  red theraband   Bridge 10 reps;2 seconds   Bridge Limitations difficulty pushing up high   Shoulder Exercises: Supine   Protraction AROM;Strengthening;Right;10 reps  2 sets   Theraband Level (Shoulder Protraction) Level 4 (Blue)   Shoulder Exercises: Seated   Row AROM;Strengthening;Both;10 reps;Theraband   Theraband Level (Shoulder Row) Level 1 (Yellow)   External Rotation AROM;Strengthening;Right;10 reps;Theraband   Theraband Level (Shoulder External Rotation) Level 1 (Yellow)   Internal Rotation AROM;Strengthening   Modalities   Modalities Moist Heat   Moist Heat Therapy   Number Minutes Moist Heat 10 Minutes   Moist Heat Location --  low back in supine                PT Education - 10/26/14 1236    Education provided Yes   Education Details deep neck flexors, and postural education handout   Person(s) Educated Patient   Methods Explanation   Comprehension Verbalized understanding          PT Short Term Goals - 10/13/14 1414     PT SHORT TERM GOAL #1   Title pt will be I with basic HEP 11/10/2014   Time 4   Period Weeks   Status New   PT SHORT TERM GOAL #2   Title pt will decrease R shoulder and low back pain to <4/10 during and following R UE use to assist with exercise progression 11/10/2014   Time 4   Period Weeks   Status New   PT SHORT TERM GOAL #3   Title pt will increase trunk mobility by > 5 degrees in all planes to assist with ADL's 11/10/2014   Time 4   Period Weeks   Status New   PT SHORT TERM GOAL #4   Title She will be able to verbalize and demonstrate techniques to control pain and inflammation for low back and R shoulder via Rest, Ice, and HEP 11/10/2014   Time 4   Period Weeks   Status New           PT Long Term Goals - 10/13/14 1549    PT LONG TERM GOAL #1   Title pt will be I with Advanced HEP 12/13/2014   Time 8   Period Weeks   Status New   PT LONG TERM GOAL #2   Title pt will increase R shoulder strength to >4/5 to assist with donning/doffing clothing and ADls 12/23/1599   Time 8   Period Weeks   Status New   PT LONG TERM GOAL #3   Title pt will Increase overall trunk mobility by > 20 degrees in all planes ot assist with safety during ambulation/driving 0/93/2355   Time 8   Period Weeks   Status New   PT LONG TERM GOAL #4   Title she will demonstrate pain <2/10 with shoulder and back to assist with ADLs 12/13/2014   Time 8   Period Weeks   Status New   PT LONG TERM GOAL #5   Title She will be able to verbalize and demonstrate techniques to reduce risk or reinjury via postural awareness and control, lifting and carrying mechanics, and HEP 12/13/2014   Time 8   Period Weeks   Status New               Plan - 10/26/14 1240    Clinical Impression Statement Marcos presents to therapy stating that she is doing a little better since the last visit. She reported  some increase pain in the R shoulder since the last visit but is doing better today. she tolerated exercises well  today without increase in pain. performed TUG today and she scored 1.0 M/s which is below a community ambulatore.    PT Next Visit Plan lower trunk mobilizations, modalites for pain PRN, R shoulder strengthening and scapular stability        Problem List Patient Active Problem List   Diagnosis Date Noted  . Prediabetes 01/27/2014  . Medication management 01/27/2014  . 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 Vitamin D deficiency 07/08/2013  . Hypertension   . Hyperlipemia   . GERD   . Headache, post-traumatic, chronic 09/20/2011  . Fracture, zygoma closed 07/13/2011   Starr Lake PT, DPT, LAT, ATC  10/26/2014  12:46 PM    [facility] [address] [place], [place], [address] Phone: [phone]   Fax:  [phone]

## 2014-11-01 ENCOUNTER — Ambulatory Visit: Payer: PPO | Admitting: Physical Therapy

## 2014-11-01 DIAGNOSIS — M545 Low back pain, unspecified: Secondary | ICD-10-CM

## 2014-11-01 DIAGNOSIS — R29898 Other symptoms and signs involving the musculoskeletal system: Secondary | ICD-10-CM

## 2014-11-01 DIAGNOSIS — R293 Abnormal posture: Secondary | ICD-10-CM

## 2014-11-01 DIAGNOSIS — M25511 Pain in right shoulder: Secondary | ICD-10-CM

## 2014-11-01 DIAGNOSIS — R269 Unspecified abnormalities of gait and mobility: Secondary | ICD-10-CM

## 2014-11-01 DIAGNOSIS — M5386 Other specified dorsopathies, lumbar region: Secondary | ICD-10-CM

## 2014-11-01 NOTE — Therapy (Signed)
[place], [place], [address] Phone: [phone]   Fax:  [phone]  Physical Therapy Treatment  Patient Details  Name: Debra White MRN: [id number] Date of Birth: 06-22-1934 Referring Provider:  Unk Pinto, MD  Encounter Date: 11/01/2014      PT End of Session - 11/01/14 1117    Visit Number 3   Number of Visits 16   Date for PT Re-Evaluation 12/13/14   PT Start Time 1100   PT Stop Time 1140   PT Time Calculation (min) 40 min      Past Medical History  Diagnosis Date  . Fibromyalgia   . Anxiety   . Dysrhythmia     RX  . Blood transfusion   . Chronic kidney disease     STONES  . Headache(784.0)   . Arthritis   . Cataract   . Diverticulosis of colon (without mention of hemorrhage) 2007    Colonoscopy  . Candida esophagitis 2013    EGD  . Family history of colon cancer     sister  . Hypertension   . Hyperlipemia   . GERD (gastroesophageal reflux disease)   . Emphysema of lung     Past Surgical History  Procedure Laterality Date  . Abdominal hysterectomy    . Hand surgery      BIL   . Shoulder arthroscopy w/ rotator cuff repair      LFT  . Cervical disc surgery    . Back surgery      X2  . Orif tripod fracture  07/13/2011    Procedure: OPEN REDUCTION INTERNAL FIXATION (ORIF) TRIPOD FRACTURE;  Surgeon: Tyson Alias, MD;  Location: [place] [facility];  Service: ENT;  Laterality: Right;  ORIF RIGHT ZYGOMA, ORBITAL FLOOR EXPLORATION WITH FROST STITCH (TEMPORARY TARSORRHAPHY)  . Colonoscopy    . Facial fracture surgery    . Back surgery      There were no vitals filed for this visit.  Visit Diagnosis:  Bilateral low back pain without sciatica  Decreased ROM of lumbar spine  Right shoulder pain  Weakness of shoulder  Abnormality of gait  Abnormal posture      Subjective Assessment - 11/01/14 1104    Subjective My hips are hurting today. I had a severe headache this morning,  better now. I have been doing my exercises everyday except the weekend. Right shoulder sore in the front but only hurts when I try to clean with it.    Aggravating Factors  prolonged walking and standing   Pain Relieving Factors heat, ointment.                          [facility] Adult PT Treatment/Exercise - 11/01/14 1107    Lumbar Exercises: Stretches   Active Hamstring Stretch 3 reps;30 seconds   Lower Trunk Rotation 5 reps;20 seconds   Lumbar Exercises: Aerobic   Stationary Bike Nu STep L2 x 5 min   Lumbar Exercises: Supine   Ab Set 10 reps;5 seconds   AB Set Limitations with ball squeeze   Clam 20 reps   Clam Limitations with ab set   Heel Slides 10 reps   Heel Slides Limitations with ab set   Straight Leg Raise 10 reps   Straight Leg Raises Limitations with ab set   Shoulder Exercises: Supine   External Rotation Strengthening;Both;20 reps;Theraband   Theraband Level (Shoulder External Rotation) Level 2 (Red)   Flexion  Strengthening;Both;10 reps   Shoulder Flexion Weight (lbs) 2# on cane removed weight after c/o increased pain.    Other Supine Exercises supine cane pullover x 10   Shoulder Exercises: Seated   Row AROM;Strengthening;Both;10 reps;Theraband   Theraband Level (Shoulder Row) Level 1 (Yellow)   Internal Rotation Strengthening;Right;15 reps   Theraband Level (Shoulder Internal Rotation) Level 2 (Red)   Flexion Strengthening;Right;10 reps;Theraband  seated punch   Theraband Level (Shoulder Flexion) Level 1 (Yellow)                  PT Short Term Goals - 11/01/14 1122    PT SHORT TERM GOAL #1   Title pt will be I with basic HEP 11/10/2014   Time 4   Period Weeks   Status Achieved   PT SHORT TERM GOAL #2   Title pt will decrease R shoulder and low back pain to <4/10 during and following R UE use to assist with exercise progression 11/10/2014   Time 4   Period Weeks   Status On-going   PT SHORT TERM GOAL #3   Title pt will increase trunk  mobility by > 5 degrees in all planes to assist with ADL's 11/10/2014   Time 4   Period Weeks   Status On-going   PT SHORT TERM GOAL #4   Title She will be able to verbalize and demonstrate techniques to control pain and inflammation for low back and R shoulder via Rest, Ice, and HEP 11/10/2014   Time 4   Period Weeks   Status On-going           PT Long Term Goals - 11/01/14 1122    PT LONG TERM GOAL #1   Title pt will be I with Advanced HEP 12/13/2014   Time 8   Period Weeks   Status On-going   PT LONG TERM GOAL #2   Title pt will increase R shoulder strength to >4/5 to assist with donning/doffing clothing and ADls 6/75/9163   Time 8   Period Weeks   Status On-going   PT LONG TERM GOAL #3   Title pt will Increase overall trunk mobility by > 20 degrees in all planes ot assist with safety during ambulation/driving 8/46/6599   Time 8   Period Weeks   Status On-going   PT LONG TERM GOAL #4   Title she will demonstrate pain <2/10 with shoulder and back to assist with ADLs 12/13/2014   Time 8   Period Weeks   Status On-going   PT LONG TERM GOAL #5   Title She will be able to verbalize and demonstrate techniques to reduce risk or reinjury via postural awareness and control, lifting and carrying mechanics, and HEP 12/13/2014   Time 8   Period Weeks   Status On-going               Plan - 11/01/14 1141    Clinical Impression Statement Good tolerance to exerises without incresaed pain post treatment today. Able to progress shoulder and core strengthening. Declined modalities.   PT Next Visit Plan lower trunk mobilizations, modalites for pain PRN, R shoulder strengthening and scapular stability        Problem List Patient Active Problem List   Diagnosis Date Noted  . Prediabetes 01/27/2014  . Medication management 01/27/2014  . 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 Vitamin D deficiency 07/08/2013  . Hypertension   . Hyperlipemia   . GERD   . Headache, post-traumatic,  chronic 09/20/2011  .  Fracture, zygoma closed 07/13/2011    Dorene Ar, PTA 11/01/2014, 11:45 AM  [facility] [address] [place], [place], [address] Phone: [phone]   Fax:  [phone]

## 2014-11-03 ENCOUNTER — Ambulatory Visit: Payer: PPO | Admitting: Physical Therapy

## 2014-11-03 DIAGNOSIS — M545 Low back pain, unspecified: Secondary | ICD-10-CM

## 2014-11-03 DIAGNOSIS — M5386 Other specified dorsopathies, lumbar region: Secondary | ICD-10-CM

## 2014-11-03 DIAGNOSIS — M25511 Pain in right shoulder: Secondary | ICD-10-CM

## 2014-11-03 DIAGNOSIS — R269 Unspecified abnormalities of gait and mobility: Secondary | ICD-10-CM

## 2014-11-03 DIAGNOSIS — R29898 Other symptoms and signs involving the musculoskeletal system: Secondary | ICD-10-CM

## 2014-11-03 DIAGNOSIS — R293 Abnormal posture: Secondary | ICD-10-CM

## 2014-11-03 NOTE — Therapy (Signed)
[place], [place], [address] Phone: [phone]   Fax:  [phone]  Physical Therapy Treatment  Patient Details  Name: Debra White MRN: [id number] Date of Birth: [birth year] Referring Provider:  Unk Pinto, MD  Encounter Date: 11/03/2014      PT End of Session - 11/03/14 1044    Visit Number 4   Number of Visits 16   Date for PT Re-Evaluation 12/13/14   PT Start Time 9381   PT Stop Time 1125   PT Time Calculation (min) 45 min      Past Medical History  Diagnosis Date  . Fibromyalgia   . Anxiety   . Dysrhythmia     RX  . Blood transfusion   . Chronic kidney disease     STONES  . Headache(784.0)   . Arthritis   . Cataract   . Diverticulosis of colon (without mention of hemorrhage) 2007    Colonoscopy  . Candida esophagitis 2013    EGD  . Family history of colon cancer     sister  . Hypertension   . Hyperlipemia   . GERD (gastroesophageal reflux disease)   . Emphysema of lung     Past Surgical History  Procedure Laterality Date  . Abdominal hysterectomy    . Hand surgery      BIL   . Shoulder arthroscopy w/ rotator cuff repair      LFT  . Cervical disc surgery    . Back surgery      X2  . Orif tripod fracture  07/13/2011    Procedure: OPEN REDUCTION INTERNAL FIXATION (ORIF) TRIPOD FRACTURE;  Surgeon: Tyson Alias, MD;  Location: [facility];  Service: ENT;  Laterality: Right;  ORIF RIGHT ZYGOMA, ORBITAL FLOOR EXPLORATION WITH FROST STITCH (TEMPORARY TARSORRHAPHY)  . Colonoscopy    . Facial fracture surgery    . Back surgery      There were no vitals filed for this visit.  Visit Diagnosis:  Bilateral low back pain without sciatica  Decreased ROM of lumbar spine  Right shoulder pain  Weakness of shoulder  Abnormality of gait  Abnormal posture      Subjective Assessment - 11/03/14 1043    Subjective I am doing okay. I just have a headache. I did a little  housework yesterday so my shoulder and back hurts. I had alot less pain after last treatment. It returned the next day after housework. It feels like my right shoulder and arm are sore at the bone, not in the muscles.    Currently in Pain? Yes   Pain Score 5    Pain Location Back   Pain Descriptors / Indicators Aching   Pain Type Chronic pain   Pain Score 0  4/10 with use of right arm    Pain Location Shoulder   Pain Orientation Right   Aggravating Factors  using right arm, cold weather   Pain Relieving Factors tylenol, heat                         [facility] Adult PT Treatment/Exercise - 11/03/14 1046    Standardized Balance Assessment   Standardized Balance Assessment Berg Balance Test   Berg Balance Test   Sit to Stand Able to stand without using hands and stabilize independently   Standing Unsupported Able to stand safely 2 minutes   Sitting with Back Unsupported but Feet Supported on Floor or Stool Able  to sit safely and securely 2 minutes   Stand to Sit Sits safely with minimal use of hands   Transfers Able to transfer safely, minor use of hands   Standing Unsupported with Eyes Closed Able to stand 10 seconds safely   Standing Ubsupported with Feet Together Able to place feet together independently and stand 1 minute safely   From Standing, Reach Forward with Outstretched Arm Can reach confidently >25 cm (10")   From Standing Position, Pick up Object from Floor Able to pick up shoe safely and easily   From Standing Position, Turn to Look Behind Over each Shoulder Looks behind from both sides and weight shifts well   Turn 360 Degrees Able to turn 360 degrees safely in 4 seconds or less   Standing Unsupported, Alternately Place Feet on Step/Stool Able to stand independently and safely and complete 8 steps in 20 seconds   Standing Unsupported, One Foot in Front Able to plae foot ahead of the other independently and hold 30 seconds   Standing on One Leg Able to lift leg  independently and hold 5-10 seconds   Total Score 54   Lumbar Exercises: Aerobic   Stationary Bike Nustep L3 x 6 min   Lumbar Exercises: Supine   Ab Set 10 reps;5 seconds   AB Set Limitations with ball squeeze   Clam 20 reps   Clam Limitations with ab set   Heel Slides 10 reps   Heel Slides Limitations with ab set  cues to control pelvic rocking   Straight Leg Raise 10 reps   Straight Leg Raises Limitations with ab set   Lumbar Exercises: Sidelying   Clam 10 reps   Shoulder Exercises: Supine   External Rotation Strengthening;Both;20 reps;Theraband   Theraband Level (Shoulder External Rotation) Level 2 (Red)   Flexion Strengthening;Both;20 reps   Flexion Limitations with cane   Other Supine Exercises supine cane pullover x 10   Shoulder Exercises: Seated   Row AROM;Strengthening;Both;10 reps;Theraband   Theraband Level (Shoulder Row) Level 2 (Red)   Shoulder Exercises: Standing   Internal Rotation Right;10 reps;Theraband   Theraband Level (Shoulder Internal Rotation) Level 2 (Red)   Flexion Strengthening;Right;10 reps;Theraband   Theraband Level (Shoulder Flexion) Level 1 (Yellow)   Flexion Limitations punch   Row Strengthening;Both;10 reps   Theraband Level (Shoulder Row) Level 2 (Red)                  PT Short Term Goals - 11/01/14 1122    PT SHORT TERM GOAL #1   Title pt will be I with basic HEP 11/10/2014   Time 4   Period Weeks   Status Achieved   PT SHORT TERM GOAL #2   Title pt will decrease R shoulder and low back pain to <4/10 during and following R UE use to assist with exercise progression 11/10/2014   Time 4   Period Weeks   Status On-going   PT SHORT TERM GOAL #3   Title pt will increase trunk mobility by > 5 degrees in all planes to assist with ADL's 11/10/2014   Time 4   Period Weeks   Status On-going   PT SHORT TERM GOAL #4   Title She will be able to verbalize and demonstrate techniques to control pain and inflammation for low back and R  shoulder via Rest, Ice, and HEP 11/10/2014   Time 4   Period Weeks   Status On-going  PT Long Term Goals - 11/01/14 1122    PT LONG TERM GOAL #1   Title pt will be I with Advanced HEP 12/13/2014   Time 8   Period Weeks   Status On-going   PT LONG TERM GOAL #2   Title pt will increase R shoulder strength to >4/5 to assist with donning/doffing clothing and ADls 9/37/3428   Time 8   Period Weeks   Status On-going   PT LONG TERM GOAL #3   Title pt will Increase overall trunk mobility by > 20 degrees in all planes ot assist with safety during ambulation/driving 7/68/1157   Time 8   Period Weeks   Status On-going   PT LONG TERM GOAL #4   Title she will demonstrate pain <2/10 with shoulder and back to assist with ADLs 12/13/2014   Time 8   Period Weeks   Status On-going   PT LONG TERM GOAL #5   Title She will be able to verbalize and demonstrate techniques to reduce risk or reinjury via postural awareness and control, lifting and carrying mechanics, and HEP 12/13/2014   Time 8   Period Weeks   Status On-going               Plan - 11/03/14 1128    Clinical Impression Statement BERG 54/56. Pt reports pain decreased to 0/10 after last treatment and no increased pain after treatment today. Pt progressed to increased reps and resistance with good tolerance. She declined HMP due to not wanting to get dampness on her clothing.    PT Next Visit Plan lower trunk mobilizations, modalites for pain PRN, R shoulder strengthening and scapular stability        Problem List Patient Active Problem List   Diagnosis Date Noted  . Prediabetes 01/27/2014  . Medication management 01/27/2014  . 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 Vitamin D deficiency 07/08/2013  . Hypertension   . Hyperlipemia   . GERD   . Headache, post-traumatic, chronic 09/20/2011  . Fracture, zygoma closed 07/13/2011    Dorene Ar, PTA 11/03/2014, 11:35 AM  [facility] [address] [place], [place], [address] Phone: [phone]   Fax:  [phone]

## 2014-11-08 ENCOUNTER — Ambulatory Visit: Payer: PPO | Admitting: Physical Therapy

## 2014-11-08 DIAGNOSIS — R293 Abnormal posture: Secondary | ICD-10-CM

## 2014-11-08 DIAGNOSIS — M5386 Other specified dorsopathies, lumbar region: Secondary | ICD-10-CM

## 2014-11-08 DIAGNOSIS — R269 Unspecified abnormalities of gait and mobility: Secondary | ICD-10-CM

## 2014-11-08 DIAGNOSIS — R29898 Other symptoms and signs involving the musculoskeletal system: Secondary | ICD-10-CM

## 2014-11-08 DIAGNOSIS — M545 Low back pain, unspecified: Secondary | ICD-10-CM

## 2014-11-08 DIAGNOSIS — M25511 Pain in right shoulder: Secondary | ICD-10-CM

## 2014-11-08 NOTE — Therapy (Addendum)
[place], [place], [address] Phone: [phone]   Fax:  [phone]  Physical Therapy Treatment  Patient Details  Name: Debra White MRN: [id number] Date of Birth: 11/22/1933 Referring Provider:  Unk Pinto, MD  Encounter Date: 11/08/2014      PT End of Session - 11/08/14 1053    Visit Number 5   Number of Visits 16   Date for PT Re-Evaluation 12/13/14   PT Start Time 1017   PT Stop Time 1100   PT Time Calculation (min) 43 min      Past Medical History  Diagnosis Date  . Fibromyalgia   . Anxiety   . Dysrhythmia     RX  . Blood transfusion   . Chronic kidney disease     STONES  . Headache(784.0)   . Arthritis   . Cataract   . Diverticulosis of colon (without mention of hemorrhage) 2007    Colonoscopy  . Candida esophagitis 2013    EGD  . Family history of colon cancer     sister  . Hypertension   . Hyperlipemia   . GERD (gastroesophageal reflux disease)   . Emphysema of lung     Past Surgical History  Procedure Laterality Date  . Abdominal hysterectomy    . Hand surgery      BIL   . Shoulder arthroscopy w/ rotator cuff repair      LFT  . Cervical disc surgery    . Back surgery      X2  . Orif tripod fracture  07/13/2011    Procedure: OPEN REDUCTION INTERNAL FIXATION (ORIF) TRIPOD FRACTURE;  Surgeon: Tyson Alias, MD;  Location: [facility];  Service: ENT;  Laterality: Right;  ORIF RIGHT ZYGOMA, ORBITAL FLOOR EXPLORATION WITH FROST STITCH (TEMPORARY TARSORRHAPHY)  . Colonoscopy    . Facial fracture surgery    . Back surgery      There were no vitals filed for this visit.  Visit Diagnosis:  Bilateral low back pain without sciatica  Decreased ROM of lumbar spine  Right shoulder pain  Weakness of shoulder  Abnormality of gait  Abnormal posture      Subjective Assessment - 11/08/14 1327    Subjective I doing fair. I am going to cancel the rest of my appointments.              [facility] PT Assessment - 11/08/14 1146    Observation/Other Assessments   Focus on Therapeutic Outcomes (FOTO)  45% limited (improved from 60% limited) predicted 46%                              PT Education - 11/08/14 1053    Education provided Yes   Education Details Prepilates   Person(s) Educated Patient   Methods Explanation;Handout   Comprehension Verbalized understanding          PT Short Term Goals - 11/08/14 1038    PT SHORT TERM GOAL #1   Title pt will be I with basic HEP 11/10/2014   Time 4   Period Weeks   Status Achieved   PT SHORT TERM GOAL #2   Title pt will decrease R shoulder and low back pain to <4/10 during and following R UE use to assist with exercise progression 11/10/2014   Time 4   Period Weeks   Status Partially Met  Low back average 3-4/10.   PT  SHORT TERM GOAL #3   Title pt will increase trunk mobility by > 5 degrees in all planes to assist with ADL's 11/10/2014   Time 4   Status Partially Met   PT SHORT TERM GOAL #4   Title She will be able to verbalize and demonstrate techniques to control pain and inflammation for low back and R shoulder via Rest, Ice, and HEP 11/10/2014   Time 4   Period Weeks   Status Achieved           PT Long Term Goals - 11/08/14 1044    PT LONG TERM GOAL #1   Title pt will be I with Advanced HEP 12/13/2014   Time 8   Period Weeks   Status Achieved   PT LONG TERM GOAL #2   Title pt will increase R shoulder strength to >4/5 to assist with donning/doffing clothing and ADls 5/78/4696   Time 8   Period Weeks   Status Partially Met   PT LONG TERM GOAL #3   Title pt will Increase overall trunk mobility by > 20 degrees in all planes ot assist with safety during ambulation/driving 2/95/2841   Time 8   Period Weeks   Status Not Met   PT LONG TERM GOAL #4   Title she will demonstrate pain <2/10 with shoulder and back to assist with ADLs 12/13/2014   Time 8   Period Weeks   Status  Not Met   PT LONG TERM GOAL #5   Title She will be able to verbalize and demonstrate techniques to reduce risk or reinjury via postural awareness and control, lifting and carrying mechanics, and HEP 12/13/2014   Time 8   Period Weeks   Status Achieved               Plan - 11/08/14 1029    Clinical Impression Statement Pt would like to discontinue PT due to financial reasons. Pt reports her back pain has decreased to 3-4/10. She no longer has leg cramps and her legs feel stronger allowing her to walk now for exercise 15 minutes every evening. She reports continued pain with riding in the car. She reports her back no longer hurts at night and she can lie on either of her hips without pain. She reports no improvement in shoulder pain with PT. Continued shoulder pain with cleaning and walking 8/10. She made improvements in her lumbar AROM and shoulder strength. See goals met.    PT Next Visit Plan discharge today        Problem List Patient Active Problem List   Diagnosis Date Noted  . Prediabetes 01/27/2014  . Medication management 01/27/2014  . 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 Vitamin D deficiency 07/08/2013  . Hypertension   . Hyperlipemia   . GERD   . Headache, post-traumatic, chronic 09/20/2011  . Fracture, zygoma closed 07/13/2011    Dorene Ar, PTA 11/08/2014, 1:41 PM  [facility] [address] [place], [place], [address] Phone: [phone]   Fax:  [phone]      PHYSICAL THERAPY DISCHARGE SUMMARY  Visits from Start of Care: 5  Current functional level related to goals / functional outcomes: 55   Remaining deficits: See goals   Education / Equipment: HEP handout  Plan: Patient agrees to discharge.  Patient goals were partially met. Patient is being discharged due to financial reasons.  ?????       Kristoffer Leamon PT, DPT, LAT, ATC  11/08/2014  2:49 PM

## 2014-11-08 NOTE — Patient Instructions (Signed)

## 2014-11-10 ENCOUNTER — Encounter: Payer: PPO | Admitting: Physical Therapy

## 2014-11-15 ENCOUNTER — Encounter: Payer: PPO | Admitting: Physical Therapy

## 2014-11-17 ENCOUNTER — Encounter: Payer: PPO | Admitting: Physical Therapy

## 2014-11-30 ENCOUNTER — Other Ambulatory Visit: Payer: Self-pay | Admitting: *Deleted

## 2014-11-30 DIAGNOSIS — F32A Depression, unspecified: Secondary | ICD-10-CM

## 2014-11-30 DIAGNOSIS — F329 Major depressive disorder, single episode, unspecified: Secondary | ICD-10-CM

## 2014-11-30 MED ORDER — CLONAZEPAM 1 MG PO TABS: ORAL_TABLET | ORAL | Status: DC

## 2014-12-06 ENCOUNTER — Ambulatory Visit: Payer: Self-pay | Admitting: Internal Medicine

## 2014-12-06 ENCOUNTER — Telehealth: Payer: Self-pay | Admitting: *Deleted

## 2014-12-06 NOTE — Telephone Encounter (Signed)
Patient called and states Prednisone is causing stomach pain.Per Dr [name], [place] to cut Prednisone dose in 1/2.The patient left the office before the visit was finished. Message to inform the patient.

## 2014-12-07 ENCOUNTER — Other Ambulatory Visit: Payer: Self-pay | Admitting: Internal Medicine

## 2014-12-07 ENCOUNTER — Encounter: Payer: Self-pay | Admitting: Internal Medicine

## 2014-12-07 ENCOUNTER — Ambulatory Visit (INDEPENDENT_AMBULATORY_CARE_PROVIDER_SITE_OTHER): Payer: PPO | Admitting: Internal Medicine

## 2014-12-07 VITALS — BP 124/62 | HR 60 | Temp 97.5°F | Resp 16 | Ht 64.5 in | Wt 100.8 lb

## 2014-12-07 DIAGNOSIS — G44329 Chronic post-traumatic headache, not intractable: Secondary | ICD-10-CM

## 2014-12-07 DIAGNOSIS — E559 Vitamin D deficiency, unspecified: Secondary | ICD-10-CM

## 2014-12-07 DIAGNOSIS — K219 Gastro-esophageal reflux disease without esophagitis: Secondary | ICD-10-CM

## 2014-12-07 DIAGNOSIS — Z1331 Encounter for screening for depression: Secondary | ICD-10-CM

## 2014-12-07 DIAGNOSIS — R7309 Other abnormal glucose: Secondary | ICD-10-CM

## 2014-12-07 DIAGNOSIS — IMO0001 Reserved for inherently not codable concepts without codable children: Secondary | ICD-10-CM

## 2014-12-07 DIAGNOSIS — R7303 Prediabetes: Secondary | ICD-10-CM

## 2014-12-07 DIAGNOSIS — Z9181 History of falling: Secondary | ICD-10-CM

## 2014-12-07 DIAGNOSIS — I10 Essential (primary) hypertension: Secondary | ICD-10-CM

## 2014-12-07 DIAGNOSIS — Z79899 Other long term (current) drug therapy: Secondary | ICD-10-CM

## 2014-12-07 DIAGNOSIS — Z0000 Encounter for general adult medical examination without abnormal findings: Secondary | ICD-10-CM

## 2014-12-07 DIAGNOSIS — Z0001 Encounter for general adult medical examination with abnormal findings: Secondary | ICD-10-CM

## 2014-12-07 DIAGNOSIS — G501 Atypical facial pain: Secondary | ICD-10-CM

## 2014-12-07 DIAGNOSIS — S02402S Zygomatic fracture, unspecified, sequela: Secondary | ICD-10-CM

## 2014-12-07 DIAGNOSIS — R6889 Other general symptoms and signs: Secondary | ICD-10-CM

## 2014-12-07 DIAGNOSIS — E785 Hyperlipidemia, unspecified: Secondary | ICD-10-CM

## 2014-12-07 LAB — CBC WITH DIFFERENTIAL/PLATELET
BASOS ABS: 0 10*3/uL (ref 0.0–0.1)
Basophils Relative: 0 % (ref 0–1)
EOS ABS: 0 10*3/uL (ref 0.0–0.7)
EOS PCT: 0 % (ref 0–5)
HEMATOCRIT: 35.8 % — AB (ref 36.0–46.0)
HEMOGLOBIN: 12.1 g/dL (ref 12.0–15.0)
Lymphocytes Relative: 9 % — ABNORMAL LOW (ref 12–46)
Lymphs Abs: 1.1 10*3/uL (ref 0.7–4.0)
MCH: 31.3 pg (ref 26.0–34.0)
MCHC: 33.8 g/dL (ref 30.0–36.0)
MCV: 92.5 fL (ref 78.0–100.0)
MPV: 10.2 fL (ref 8.6–12.4)
Monocytes Absolute: 0.7 10*3/uL (ref 0.1–1.0)
Monocytes Relative: 6 % (ref 3–12)
Neutro Abs: 10.3 10*3/uL — ABNORMAL HIGH (ref 1.7–7.7)
Neutrophils Relative %: 85 % — ABNORMAL HIGH (ref 43–77)
Platelets: 229 10*3/uL (ref 150–400)
RBC: 3.87 MIL/uL (ref 3.87–5.11)
RDW: 12.7 % (ref 11.5–15.5)
WBC: 12.1 10*3/uL — AB (ref 4.0–10.5)

## 2014-12-07 LAB — LIPID PANEL
Cholesterol: 168 mg/dL (ref 0–200)
HDL: 96 mg/dL (ref >=46)
LDL Cholesterol: 57 mg/dL (ref 0–99)
Total CHOL/HDL Ratio: 1.8 Ratio
Triglycerides: 75 mg/dL (ref <150)
VLDL: 15 mg/dL (ref 0–40)

## 2014-12-07 LAB — TSH: TSH: 0.462 u[IU]/mL (ref 0.350–4.500)

## 2014-12-07 LAB — HEPATIC FUNCTION PANEL
ALT: 17 U/L (ref 0–35)
AST: 19 U/L (ref 0–37)
Albumin: 5 g/dL (ref 3.5–5.2)
Alkaline Phosphatase: 62 U/L (ref 39–117)
Bilirubin, Direct: 0.1 mg/dL (ref 0.0–0.3)
Indirect Bilirubin: 0.5 mg/dL (ref 0.2–1.2)
TOTAL PROTEIN: 7.7 g/dL (ref 6.0–8.3)
Total Bilirubin: 0.6 mg/dL (ref 0.2–1.2)

## 2014-12-07 LAB — HEMOGLOBIN A1C
Hgb A1c MFr Bld: 5.7 % — ABNORMAL HIGH (ref <5.7)
Mean Plasma Glucose: 117 mg/dL — ABNORMAL HIGH (ref <117)

## 2014-12-07 LAB — BASIC METABOLIC PANEL WITH GFR
BUN: 14 mg/dL (ref 6–23)
CHLORIDE: 92 meq/L — AB (ref 96–112)
CO2: 31 meq/L (ref 19–32)
Calcium: 9.8 mg/dL (ref 8.4–10.5)
Creat: 0.72 mg/dL (ref 0.50–1.10)
GFR, Est Non African American: 80 mL/min
GLUCOSE: 106 mg/dL — AB (ref 70–99)
POTASSIUM: 4.4 meq/L (ref 3.5–5.3)
SODIUM: 128 meq/L — AB (ref 135–145)

## 2014-12-07 LAB — MAGNESIUM: Magnesium: 1.9 mg/dL (ref 1.5–2.5)

## 2014-12-07 NOTE — Patient Instructions (Signed)
Recommend Adult Low dose Aspirin or baby Aspirin 81 mg daily   To reduce risk of Colon Cancer 20 %,   Skin Cancer 26 % ,   Melanoma 46%   and   Pancreatic cancer 60%  ++++++++++++++++++  Vitamin D goal is between 70-100.   Please make sure that you are taking your Vitamin D as directed.   It is very important as a natural anti-inflammatory   helping hair, skin, and nails, as well as reducing stroke and heart attack risk.   It helps your bones and helps with mood.  It also decreases numerous cancer risks so please take it as directed.   Low Vit D is associated with a 200-300% higher risk for CANCER   and 200-300% higher risk for HEART   ATTACK  &  STROKE.    .....................................Marland Kitchen  It is also associated with higher death rate at younger ages,   autoimmune diseases like Rheumatoid arthritis, Lupus, Multiple Sclerosis.     Also many other serious conditions, like depression, Alzheimer's  Dementia, infertility, muscle aches, fatigue, fibromyalgia - just to name a few.  +++++++++++++++++++    Recommend the book "The END of DIETING" by Dr Excell Seltzer   & the book "The END of DIABETES " by Dr Excell Seltzer  At [email or web address] - get book & Audio CD's     Being diabetic has a  300% increased risk for heart attack, stroke, cancer, and alzheimer- type vascular dementia. It is very important that you work harder with diet by avoiding all foods that are white. Avoid white rice (brown & wild rice is OK), white potatoes (sweetpotatoes in moderation is OK), White bread or wheat bread or anything made out of white flour like bagels, donuts, rolls, buns, biscuits, cakes, pastries, cookies, pizza crust, and pasta (made from white flour & egg whites) - vegetarian pasta or spinach or wheat pasta is OK. Multigrain breads like Arnold's or [employer], or multigrain sandwich thins or flatbreads.  Diet, exercise and weight loss can reverse and cure diabetes in the early  stages.  Diet, exercise and weight loss is very important in the control and prevention of complications of diabetes which affects every system in your body, ie. Brain - dementia/stroke, eyes - glaucoma/blindness, heart - heart attack/heart failure, kidneys - dialysis, stomach - gastric paralysis, intestines - malabsorption, nerves - severe painful neuritis, circulation - gangrene & loss of a leg(s), and finally cancer and Alzheimers.    I recommend avoid fried & greasy foods,  sweets/candy, white rice (brown or wild rice or Quinoa is OK), white potatoes (sweet potatoes are OK) - anything made from white flour - bagels, doughnuts, rolls, buns, biscuits,white and wheat breads, pizza crust and traditional pasta made of white flour & egg white(vegetarian pasta or spinach or wheat pasta is OK).  Multi-grain bread is OK - like multi-grain flat bread or sandwich thins. Avoid alcohol in excess. Exercise is also important.    Eat all the vegetables you want - avoid meat, especially red meat and dairy - especially cheese.  Cheese is the most concentrated form of trans-fats which is the worst thing to clog up our arteries. Veggie cheese is OK which can be found in the fresh produce section at [facility] or [employer] or [employer]  ++++++++++++++++++++++++++ Preventive Care for Adults A healthy lifestyle and preventive care can promote health and wellness. Preventive health guidelines for women include the following key practices.  A routine yearly physical is a good  way to check with your health care provider about your health and preventive screening. It is a chance to share any concerns and updates on your health and to receive a thorough exam.  Visit your dentist for a routine exam and preventive care every 6 months. Brush your teeth twice a day and floss once a day. Good oral hygiene prevents tooth decay and gum disease.  The frequency of eye exams is based on your age, health, family medical history,  use of contact lenses, and other factors. Follow your health care provider's recommendations for frequency of eye exams.  Eat a healthy diet. Foods like vegetables, fruits, whole grains, low-fat dairy products, and lean protein foods contain the nutrients you need without too many calories. Decrease your intake of foods high in solid fats, added sugars, and salt. Eat the right amount of calories for you.Get information about a proper diet from your health care provider, if necessary.  Regular physical exercise is one of the most important things you can do for your health. Most adults should get at least 150 minutes of moderate-intensity exercise (any activity that increases your heart rate and causes you to sweat) each week. In addition, most adults need muscle-strengthening exercises on 2 or more days a week.  Maintain a healthy weight. The body mass index (BMI) is a screening tool to identify possible weight problems. It provides an estimate of body fat based on height and weight. Your health care provider can find your BMI and can help you achieve or maintain a healthy weight.For adults 20 years and older:  A BMI below 18.5 is considered underweight.  A BMI of 18.5 to 24.9 is normal.  A BMI of 25 to 29.9 is considered overweight.  A BMI of 30 and above is considered obese.  Maintain normal blood lipids and cholesterol levels by exercising and minimizing your intake of saturated fat. Eat a balanced diet with plenty of fruit and vegetables. Blood tests for lipids and cholesterol should begin at age 35 and be repeated every 5 years. If your lipid or cholesterol levels are high, you are over 50, or you are at high risk for heart disease, you may need your cholesterol levels checked more frequently.Ongoing high lipid and cholesterol levels should be treated with medicines if diet and exercise are not working.  If you smoke, find out from your health care provider how to quit. If you do not use  tobacco, do not start.  Lung cancer screening is recommended for adults aged 30-80 years who are at high risk for developing lung cancer because of a history of smoking. A yearly low-dose CT scan of the lungs is recommended for people who have at least a 30-pack-year history of smoking and are a current smoker or have quit within the past 15 years. A pack year of smoking is smoking an average of 1 pack of cigarettes a day for 1 year (for example: 1 pack a day for 30 years or 2 packs a day for 15 years). Yearly screening should continue until the smoker has stopped smoking for at least 15 years. Yearly screening should be stopped for people who develop a health problem that would prevent them from having lung cancer treatment.  If you are pregnant, do not drink alcohol. If you are breastfeeding, be very cautious about drinking alcohol. If you are not pregnant and choose to drink alcohol, do not have more than 1 drink per day. One drink is considered to be 12  ounces (355 mL) of beer, 5 ounces (148 mL) of wine, or 1.5 ounces (44 mL) of liquor.  Avoid use of street drugs. Do not share needles with anyone. Ask for help if you need support or instructions about stopping the use of drugs.  High blood pressure causes heart disease and increases the risk of stroke. Your blood pressure should be checked at least every 1 to 2 years. Ongoing high blood pressure should be treated with medicines if weight loss and exercise do not work.  If you are 47-33 years old, ask your health care provider if you should take aspirin to prevent strokes.  Diabetes screening involves taking a blood sample to check your fasting blood sugar level. This should be done once every 3 years, after age 28, if you are within normal weight and without risk factors for diabetes. Testing should be considered at a younger age or be carried out more frequently if you are overweight and have at least 1 risk factor for diabetes.  Breast cancer  screening is essential preventive care for women. You should practice "breast self-awareness." This means understanding the normal appearance and feel of your breasts and may include breast self-examination. Any changes detected, no matter how small, should be reported to a health care provider. Women in their 69s and 30s should have a clinical breast exam (CBE) by a health care provider as part of a regular health exam every 1 to 3 years. After age 64, women should have a CBE every year. Starting at age 55, women should consider having a mammogram (breast X-ray test) every year. Women who have a family history of breast cancer should talk to their health care provider about genetic screening. Women at a high risk of breast cancer should talk to their health care providers about having an MRI and a mammogram every year.  Breast cancer gene (BRCA)-related cancer risk assessment is recommended for women who have family members with BRCA-related cancers. BRCA-related cancers include breast, ovarian, tubal, and peritoneal cancers. Having family members with these cancers may be associated with an increased risk for harmful changes (mutations) in the breast cancer genes BRCA1 and BRCA2. Results of the assessment will determine the need for genetic counseling and BRCA1 and BRCA2 testing.  Routine pelvic exams to screen for cancer are no longer recommended for nonpregnant women who are considered low risk for cancer of the pelvic organs (ovaries, uterus, and vagina) and who do not have symptoms. Ask your health care provider if a screening pelvic exam is right for you.  If you have had past treatment for cervical cancer or a condition that could lead to cancer, you need Pap tests and screening for cancer for at least 20 years after your treatment. If Pap tests have been discontinued, your risk factors (such as having a new sexual partner) need to be reassessed to determine if screening should be resumed. Some women  have medical problems that increase the chance of getting cervical cancer. In these cases, your health care provider may recommend more frequent screening and Pap tests.  The HPV test is an additional test that may be used for cervical cancer screening. The HPV test looks for the virus that can cause the cell changes on the cervix. The cells collected during the Pap test can be tested for HPV. The HPV test could be used to screen women aged 41 years and older, and should be used in women of any age who have unclear Pap test results.  After the age of 64, women should have HPV testing at the same frequency as a Pap test.  Colorectal cancer can be detected and often prevented. Most routine colorectal cancer screening begins at the age of 2 years and continues through age 55 years. However, your health care provider may recommend screening at an earlier age if you have risk factors for colon cancer. On a yearly basis, your health care provider may provide home test kits to check for hidden blood in the stool. Use of a small camera at the end of a tube, to directly examine the colon (sigmoidoscopy or colonoscopy), can detect the earliest forms of colorectal cancer. Talk to your health care provider about this at age 75, when routine screening begins. Direct exam of the colon should be repeated every 5-10 years through age 24 years, unless early forms of pre-cancerous polyps or small growths are found.  People who are at an increased risk for hepatitis B should be screened for this virus. You are considered at high risk for hepatitis B if:  You were born in a country where hepatitis B occurs often. Talk with your health care provider about which countries are considered high risk.  Your parents were born in a high-risk country and you have not received a shot to protect against hepatitis B (hepatitis B vaccine).  You have HIV or AIDS.  You use needles to inject street drugs.  You live with, or have sex  with, someone who has hepatitis B.  You get hemodialysis treatment.  You take certain medicines for conditions like cancer, organ transplantation, and autoimmune conditions.  Hepatitis C blood testing is recommended for all people born from [birth year] through 1965 and any individual with known risks for hepatitis C.  Practice safe sex. Use condoms and avoid high-risk sexual practices to reduce the spread of sexually transmitted infections (STIs). STIs include gonorrhea, chlamydia, syphilis, trichomonas, herpes, HPV, and human immunodeficiency virus (HIV). Herpes, HIV, and HPV are viral illnesses that have no cure. They can result in disability, cancer, and death.  You should be screened for sexually transmitted illnesses (STIs) including gonorrhea and chlamydia if:  You are sexually active and are younger than 24 years.  You are older than 24 years and your health care provider tells you that you are at risk for this type of infection.  Your sexual activity has changed since you were last screened and you are at an increased risk for chlamydia or gonorrhea. Ask your health care provider if you are at risk.  If you are at risk of being infected with HIV, it is recommended that you take a prescription medicine daily to prevent HIV infection. This is called preexposure prophylaxis (PrEP). You are considered at risk if:  You are a heterosexual woman, are sexually active, and are at increased risk for HIV infection.  You take drugs by injection.  You are sexually active with a partner who has HIV.  Talk with your health care provider about whether you are at high risk of being infected with HIV. If you choose to begin PrEP, you should first be tested for HIV. You should then be tested every 3 months for as long as you are taking PrEP.  Osteoporosis is a disease in which the bones lose minerals and strength with aging. This can result in serious bone fractures or breaks. The risk of osteoporosis can  be identified using a bone density scan. Women ages 35 years and over and women at  risk for fractures or osteoporosis should discuss screening with their health care providers. Ask your health care provider whether you should take a calcium supplement or vitamin D to reduce the rate of osteoporosis.  Menopause can be associated with physical symptoms and risks. Hormone replacement therapy is available to decrease symptoms and risks. You should talk to your health care provider about whether hormone replacement therapy is right for you.  Use sunscreen. Apply sunscreen liberally and repeatedly throughout the day. You should seek shade when your shadow is shorter than you. Protect yourself by wearing long sleeves, pants, a wide-brimmed hat, and sunglasses year round, whenever you are outdoors.  Once a month, do a whole body skin exam, using a mirror to look at the skin on your back. Tell your health care provider of new moles, moles that have irregular borders, moles that are larger than a pencil eraser, or moles that have changed in shape or color.  Stay current with required vaccines (immunizations).  Influenza vaccine. All adults should be immunized every year.  Tetanus, diphtheria, and acellular pertussis (Td, Tdap) vaccine. Pregnant women should receive 1 dose of Tdap vaccine during each pregnancy. The dose should be obtained regardless of the length of time since the last dose. Immunization is preferred during the 27th-36th week of gestation. An adult who has not previously received Tdap or who does not know her vaccine status should receive 1 dose of Tdap. This initial dose should be followed by tetanus and diphtheria toxoids (Td) booster doses every 10 years. Adults with an unknown or incomplete history of completing a 3-dose immunization series with Td-containing vaccines should begin or complete a primary immunization series including a Tdap dose. Adults should receive a Td booster every 10  years.  Varicella vaccine. An adult without evidence of immunity to varicella should receive 2 doses or a second dose if she has previously received 1 dose. Pregnant females who do not have evidence of immunity should receive the first dose after pregnancy. This first dose should be obtained before leaving the health care facility. The second dose should be obtained 4-8 weeks after the first dose.  Human papillomavirus (HPV) vaccine. Females aged 13-26 years who have not received the vaccine previously should obtain the 3-dose series. The vaccine is not recommended for use in pregnant females. However, pregnancy testing is not needed before receiving a dose. If a female is found to be pregnant after receiving a dose, no treatment is needed. In that case, the remaining doses should be delayed until after the pregnancy. Immunization is recommended for any person with an immunocompromised condition through the age of 7 years if she did not get any or all doses earlier. During the 3-dose series, the second dose should be obtained 4-8 weeks after the first dose. The third dose should be obtained 24 weeks after the first dose and 16 weeks after the second dose.  Zoster vaccine. One dose is recommended for adults aged 27 years or older unless certain conditions are present.  Measles, mumps, and rubella (MMR) vaccine. Adults born before [birth year] generally are considered immune to measles and mumps. Adults born in [birth year] or later should have 1 or more doses of MMR vaccine unless there is a contraindication to the vaccine or there is laboratory evidence of immunity to each of the three diseases. A routine second dose of MMR vaccine should be obtained at least 28 days after the first dose for students attending postsecondary schools, health care workers, or international  travelers. People who received inactivated measles vaccine or an unknown type of measles vaccine during 1963-1967 should receive 2 doses of MMR vaccine.  People who received inactivated mumps vaccine or an unknown type of mumps vaccine before 1979 and are at high risk for mumps infection should consider immunization with 2 doses of MMR vaccine. For females of childbearing age, rubella immunity should be determined. If there is no evidence of immunity, females who are not pregnant should be vaccinated. If there is no evidence of immunity, females who are pregnant should delay immunization until after pregnancy. Unvaccinated health care workers born before [birth year] who lack laboratory evidence of measles, mumps, or rubella immunity or laboratory confirmation of disease should consider measles and mumps immunization with 2 doses of MMR vaccine or rubella immunization with 1 dose of MMR vaccine.  Pneumococcal 13-valent conjugate (PCV13) vaccine. When indicated, a person who is uncertain of her immunization history and has no record of immunization should receive the PCV13 vaccine. An adult aged 68 years or older who has certain medical conditions and has not been previously immunized should receive 1 dose of PCV13 vaccine. This PCV13 should be followed with a dose of pneumococcal polysaccharide (PPSV23) vaccine. The PPSV23 vaccine dose should be obtained at least 8 weeks after the dose of PCV13 vaccine. An adult aged 58 years or older who has certain medical conditions and previously received 1 or more doses of PPSV23 vaccine should receive 1 dose of PCV13. The PCV13 vaccine dose should be obtained 1 or more years after the last PPSV23 vaccine dose.  Pneumococcal polysaccharide (PPSV23) vaccine. When PCV13 is also indicated, PCV13 should be obtained first. All adults aged 27 years and older should be immunized. An adult younger than age 4 years who has certain medical conditions should be immunized. Any person who resides in a nursing home or long-term care facility should be immunized. An adult smoker should be immunized. People with an immunocompromised condition and  certain other conditions should receive both PCV13 and PPSV23 vaccines. People with human immunodeficiency virus (HIV) infection should be immunized as soon as possible after diagnosis. Immunization during chemotherapy or radiation therapy should be avoided. Routine use of PPSV23 vaccine is not recommended for American Indians, [place] Natives, or people younger than 65 years unless there are medical conditions that require PPSV23 vaccine. When indicated, people who have unknown immunization and have no record of immunization should receive PPSV23 vaccine. One-time revaccination 5 years after the first dose of PPSV23 is recommended for people aged 19-64 years who have chronic kidney failure, nephrotic syndrome, asplenia, or immunocompromised conditions. People who received 1-2 doses of PPSV23 before age 85 years should receive another dose of PPSV23 vaccine at age 53 years or later if at least 5 years have passed since the previous dose. Doses of PPSV23 are not needed for people immunized with PPSV23 at or after age 83 years.  Meningococcal vaccine. Adults with asplenia or persistent complement component deficiencies should receive 2 doses of quadrivalent meningococcal conjugate (MenACWY-D) vaccine. The doses should be obtained at least 2 months apart. Microbiologists working with certain meningococcal bacteria, [employer] recruits, people at risk during an outbreak, and people who travel to or live in countries with a high rate of meningitis should be immunized. A first-year college student up through age 39 years who is living in a residence hall should receive a dose if she did not receive a dose on or after her 16th birthday. Adults who have certain high-risk conditions should  receive one or more doses of vaccine.  Hepatitis A vaccine. Adults who wish to be protected from this disease, have certain high-risk conditions, work with hepatitis A-infected animals, work in hepatitis A research labs, or travel to or  work in countries with a high rate of hepatitis A should be immunized. Adults who were previously unvaccinated and who anticipate close contact with an international adoptee during the first 60 days after arrival in the [place] from a country with a high rate of hepatitis A should be immunized.  Hepatitis B vaccine. Adults who wish to be protected from this disease, have certain high-risk conditions, may be exposed to blood or other infectious body fluids, are household contacts or sex partners of hepatitis B positive people, are clients or workers in certain care facilities, or travel to or work in countries with a high rate of hepatitis B should be immunized.  Haemophilus influenzae type b (Hib) vaccine. A previously unvaccinated person with asplenia or sickle cell disease or having a scheduled splenectomy should receive 1 dose of Hib vaccine. Regardless of previous immunization, a recipient of a hematopoietic stem cell transplant should receive a 3-dose series 6-12 months after her successful transplant. Hib vaccine is not recommended for adults with HIV infection. Preventive Services / Frequency Ages 39 to 72 years  Blood pressure check.** / Every 1 to 2 years.  Lipid and cholesterol check.** / Every 5 years beginning at age 28.  Clinical breast exam.** / Every 3 years for women in their 32s and 58s.  BRCA-related cancer risk assessment.** / For women who have family members with a BRCA-related cancer (breast, ovarian, tubal, or peritoneal cancers).  Pap test.** / Every 2 years from ages 58 through 22. Every 3 years starting at age 53 through age 54 or 33 with a history of 3 consecutive normal Pap tests.  HPV screening.** / Every 3 years from ages 55 through ages 26 to 81 with a history of 3 consecutive normal Pap tests.  Hepatitis C blood test.** / For any individual with known risks for hepatitis C.  Skin self-exam. / Monthly.  Influenza vaccine. / Every year.  Tetanus,  diphtheria, and acellular pertussis (Tdap, Td) vaccine.** / Consult your health care provider. Pregnant women should receive 1 dose of Tdap vaccine during each pregnancy. 1 dose of Td every 10 years.  Varicella vaccine.** / Consult your health care provider. Pregnant females who do not have evidence of immunity should receive the first dose after pregnancy.  HPV vaccine. / 3 doses over 6 months, if 32 and younger. The vaccine is not recommended for use in pregnant females. However, pregnancy testing is not needed before receiving a dose.  Measles, mumps, rubella (MMR) vaccine.** / You need at least 1 dose of MMR if you were born in 1957 or later. You may also need a 2nd dose. For females of childbearing age, rubella immunity should be determined. If there is no evidence of immunity, females who are not pregnant should be vaccinated. If there is no evidence of immunity, females who are pregnant should delay immunization until after pregnancy.  Pneumococcal 13-valent conjugate (PCV13) vaccine.** / Consult your health care provider.  Pneumococcal polysaccharide (PPSV23) vaccine.** / 1 to 2 doses if you smoke cigarettes or if you have certain conditions.  Meningococcal vaccine.** / 1 dose if you are age 23 to 85 years and a Market researcher living in a residence hall, or have one of several medical conditions, you need to get  vaccinated against meningococcal disease. You may also need additional booster doses.  Hepatitis A vaccine.** / Consult your health care provider.  Hepatitis B vaccine.** / Consult your health care provider.  Haemophilus influenzae type b (Hib) vaccine.** / Consult your health care provider. Ages 74 to 55 years  Blood pressure check.** / Every 1 to 2 years.  Lipid and cholesterol check.** / Every 5 years beginning at age 44 years.  Lung cancer screening. / Every year if you are aged 17-80 years and have a 30-pack-year history of smoking and currently smoke or have  quit within the past 15 years. Yearly screening is stopped once you have quit smoking for at least 15 years or develop a health problem that would prevent you from having lung cancer treatment.  Clinical breast exam.** / Every year after age 49 years.  BRCA-related cancer risk assessment.** / For women who have family members with a BRCA-related cancer (breast, ovarian, tubal, or peritoneal cancers).  Mammogram.** / Every year beginning at age 38 years and continuing for as long as you are in good health. Consult with your health care provider.  Pap test.** / Every 3 years starting at age 80 years through age 61 or 59 years with a history of 3 consecutive normal Pap tests.  HPV screening.** / Every 3 years from ages 60 years through ages 78 to 54 years with a history of 3 consecutive normal Pap tests.  Fecal occult blood test (FOBT) of stool. / Every year beginning at age 82 years and continuing until age 69 years. You may not need to do this test if you get a colonoscopy every 10 years.  Flexible sigmoidoscopy or colonoscopy.** / Every 5 years for a flexible sigmoidoscopy or every 10 years for a colonoscopy beginning at age 61 years and continuing until age 71 years.  Hepatitis C blood test.** / For all people born from [birth year] through 1963 and any individual with known risks for hepatitis C.  Skin self-exam. / Monthly.  Influenza vaccine. / Every year.  Tetanus, diphtheria, and acellular pertussis (Tdap/Td) vaccine.** / Consult your health care provider. Pregnant women should receive 1 dose of Tdap vaccine during each pregnancy. 1 dose of Td every 10 years.  Varicella vaccine.** / Consult your health care provider. Pregnant females who do not have evidence of immunity should receive the first dose after pregnancy.  Zoster vaccine.** / 1 dose for adults aged 52 years or older.  Measles, mumps, rubella (MMR) vaccine.** / You need at least 1 dose of MMR if you were born in 1957 or later. You  may also need a 2nd dose. For females of childbearing age, rubella immunity should be determined. If there is no evidence of immunity, females who are not pregnant should be vaccinated. If there is no evidence of immunity, females who are pregnant should delay immunization until after pregnancy.  Pneumococcal 13-valent conjugate (PCV13) vaccine.** / Consult your health care provider.  Pneumococcal polysaccharide (PPSV23) vaccine.** / 1 to 2 doses if you smoke cigarettes or if you have certain conditions.  Meningococcal vaccine.** / Consult your health care provider.  Hepatitis A vaccine.** / Consult your health care provider.  Hepatitis B vaccine.** / Consult your health care provider.  Haemophilus influenzae type b (Hib) vaccine.** / Consult your health care provider. Ages 36 years and over  Blood pressure check.** / Every 1 to 2 years.  Lipid and cholesterol check.** / Every 5 years beginning at age 3 years.  Lung cancer screening. /  Every year if you are aged 4-80 years and have a 30-pack-year history of smoking and currently smoke or have quit within the past 15 years. Yearly screening is stopped once you have quit smoking for at least 15 years or develop a health problem that would prevent you from having lung cancer treatment.  Clinical breast exam.** / Every year after age 38 years.  BRCA-related cancer risk assessment.** / For women who have family members with a BRCA-related cancer (breast, ovarian, tubal, or peritoneal cancers).  Mammogram.** / Every year beginning at age 44 years and continuing for as long as you are in good health. Consult with your health care provider.  Pap test.** / Every 3 years starting at age 23 years through age 12 or 48 years with 3 consecutive normal Pap tests. Testing can be stopped between 65 and 70 years with 3 consecutive normal Pap tests and no abnormal Pap or HPV tests in the past 10 years.  HPV screening.** / Every 3 years from ages 47  years through ages 21 or 100 years with a history of 3 consecutive normal Pap tests. Testing can be stopped between 65 and 70 years with 3 consecutive normal Pap tests and no abnormal Pap or HPV tests in the past 10 years.  Fecal occult blood test (FOBT) of stool. / Every year beginning at age 81 years and continuing until age 80 years. You may not need to do this test if you get a colonoscopy every 10 years.  Flexible sigmoidoscopy or colonoscopy.** / Every 5 years for a flexible sigmoidoscopy or every 10 years for a colonoscopy beginning at age 58 years and continuing until age 55 years.  Hepatitis C blood test.** / For all people born from [birth year] through 1963 and any individual with known risks for hepatitis C.  Osteoporosis screening.** / A one-time screening for women ages 57 years and over and women at risk for fractures or osteoporosis.  Skin self-exam. / Monthly.  Influenza vaccine. / Every year.  Tetanus, diphtheria, and acellular pertussis (Tdap/Td) vaccine.** / 1 dose of Td every 10 years.  Varicella vaccine.** / Consult your health care provider.  Zoster vaccine.** / 1 dose for adults aged 79 years or older.  Pneumococcal 13-valent conjugate (PCV13) vaccine.** / Consult your health care provider.  Pneumococcal polysaccharide (PPSV23) vaccine.** / 1 dose for all adults aged 77 years and older.  Meningococcal vaccine.** / Consult your health care provider.  Hepatitis A vaccine.** / Consult your health care provider.  Hepatitis B vaccine.** / Consult your health care provider.  Haemophilus influenzae type b (Hib) vaccine.** / Consult your health care provider.   Health Maintenance Adopting a healthy lifestyle and getting preventive care can go a long way to promote health and wellness. Talk with your health care provider about what schedule of regular examinations is right for you. This is a good chance for you to check in with your provider about disease prevention and  staying healthy. In between checkups, there are plenty of things you can do on your own. Experts have done a lot of research about which lifestyle changes and preventive measures are most likely to keep you healthy. Ask your health care provider for more information. WEIGHT AND DIET  Eat a healthy diet Be sure to include plenty of vegetables, fruits, low-fat dairy products, and lean protein. Do not eat a lot of foods high in solid fats, added sugars, or salt. Get regular exercise. This is one of the most important  things you can do for your health. Most adults should exercise for at least 150 minutes each week. The exercise should increase your heart rate and make you sweat (moderate-intensity exercise). Most adults should also do strengthening exercises at least twice a week. This is in addition to the moderate-intensity exercise.  Maintain a healthy weight Body mass index (BMI) is a measurement that can be used to identify possible weight problems. It estimates body fat based on height and weight. Your health care provider can help determine your BMI and help you achieve or maintain a healthy weight. For females 42 years of age and older:  A BMI below 18.5 is considered underweight. A BMI of 18.5 to 24.9 is normal. A BMI of 25 to 29.9 is considered overweight. A BMI of 30 and above is considered obese.  Watch levels of cholesterol and blood lipids You should start having your blood tested for lipids and cholesterol at 79 years of age, then have this test every 5 years. You may need to have your cholesterol levels checked more often if: Your lipid or cholesterol levels are high. You are older than 79 years of age. You are at high risk for heart disease.  CANCER SCREENING   Lung Cancer Lung cancer screening is recommended for adults 61-50 years old who are at high risk for lung cancer because of a history of smoking. A yearly low-dose CT scan of the lungs is recommended for people  who: Currently smoke. Have quit within the past 15 years. Have at least a 30-pack-year history of smoking. A pack year is smoking an average of one pack of cigarettes a day for 1 year. Yearly screening should continue until it has been 15 years since you quit. Yearly screening should stop if you develop a health problem that would prevent you from having lung cancer treatment.  Breast Cancer Practice breast self-awareness. This means understanding how your breasts normally appear and feel. It also means doing regular breast self-exams. Let your health care provider know about any changes, no matter how small. If you are in your 20s or 30s, you should have a clinical breast exam (CBE) by a health care provider every 1-3 years as part of a regular health exam. If you are 76 or older, have a CBE every year. Also consider having a breast X-ray (mammogram) every year. If you have a family history of breast cancer, talk to your health care provider about genetic screening. If you are at high risk for breast cancer, talk to your health care provider about having an MRI and a mammogram every year. Breast cancer gene (BRCA) assessment is recommended for women who have family members with BRCA-related cancers. BRCA-related cancers include: Breast. Ovarian. Tubal. Peritoneal cancers. Results of the assessment will determine the need for genetic counseling and BRCA1 and BRCA2 testing. Cervical Cancer Routine pelvic examinations to screen for cervical cancer are no longer recommended for nonpregnant women who are considered low risk for cancer of the pelvic organs (ovaries, uterus, and vagina) and who do not have symptoms. A pelvic examination may be necessary if you have symptoms including those associated with pelvic infections. Ask your health care provider if a screening pelvic exam is right for you.  The Pap test is the screening test for cervical cancer for women who are considered at risk. If you  had a hysterectomy for a problem that was not cancer or a condition that could lead to cancer, then you no longer need Pap  tests. If you are older than 65 years, and you have had normal Pap tests for the past 10 years, you no longer need to have Pap tests. If you have had past treatment for cervical cancer or a condition that could lead to cancer, you need Pap tests and screening for cancer for at least 20 years after your treatment. If you no longer get a Pap test, assess your risk factors if they change (such as having a new sexual partner). This can affect whether you should start being screened again. Some women have medical problems that increase their chance of getting cervical cancer. If this is the case for you, your health care provider may recommend more frequent screening and Pap tests. The human papillomavirus (HPV) test is another test that may be used for cervical cancer screening. The HPV test looks for the virus that can cause cell changes in the cervix. The cells collected during the Pap test can be tested for HPV. The HPV test can be used to screen women 73 years of age and older. Getting tested for HPV can extend the interval between normal Pap tests from three to five years. An HPV test also should be used to screen women of any age who have unclear Pap test results. After 79 years of age, women should have HPV testing as often as Pap tests.  Colorectal Cancer This type of cancer can be detected and often prevented. Routine colorectal cancer screening usually begins at 79 years of age and continues through 79 years of age. Your health care provider may recommend screening at an earlier age if you have risk factors for colon cancer. Your health care provider may also recommend using home test kits to check for hidden blood in the stool. A small camera at the end of a tube can be used to examine your colon directly (sigmoidoscopy or colonoscopy). This is done to check for the  earliest forms of colorectal cancer. Routine screening usually begins at age 63. Direct examination of the colon should be repeated every 5-10 years through 79 years of age. However, you may need to be screened more often if early forms of precancerous polyps or small growths are found. Skin Cancer Check your skin from head to toe regularly. Tell your health care provider about any new moles or changes in moles, especially if there is a change in a mole's shape or color. Also tell your health care provider if you have a mole that is larger than the size of a pencil eraser. Always use sunscreen. Apply sunscreen liberally and repeatedly throughout the day. Protect yourself by wearing long sleeves, pants, a wide-brimmed hat, and sunglasses whenever you are outside. HEART DISEASE, DIABETES, AND HIGH BLOOD PRESSURE  Have your blood pressure checked at least every 1-2 years. High blood pressure causes heart disease and increases the risk of stroke. If you are between 23 years and 7 years old, ask your health care provider if you should take aspirin to prevent strokes. Have regular diabetes screenings. This involves taking a blood sample to check your fasting blood sugar level. If you are at a normal weight and have a low risk for diabetes, have this test once every three years after 79 years of age. If you are overweight and have a high risk for diabetes, consider being tested at a younger age or more often. PREVENTING INFECTION  Hepatitis B If you have a higher risk for hepatitis B, you should be screened for this virus.  You are considered at high risk for hepatitis B if: You were born in a country where hepatitis B is common. Ask your health care provider which countries are considered high risk. Your parents were born in a high-risk country, and you have not been immunized against hepatitis B (hepatitis B vaccine). You have HIV or AIDS. You use needles to inject street drugs. You live with  someone who has hepatitis B. You have had sex with someone who has hepatitis B. You get hemodialysis treatment. You take certain medicines for conditions, including cancer, organ transplantation, and autoimmune conditions. Hepatitis C Blood testing is recommended for: Everyone born from [birth year] through 1965. Anyone with known risk factors for hepatitis C. Sexually transmitted infections (STIs) You should be screened for sexually transmitted infections (STIs) including gonorrhea and chlamydia if: You are sexually active and are younger than 79 years of age. You are older than 79 years of age and your health care provider tells you that you are at risk for this type of infection. Your sexual activity has changed since you were last screened and you are at an increased risk for chlamydia or gonorrhea. Ask your health care provider if you are at risk. If you do not have HIV, but are at risk, it may be recommended that you take a prescription medicine daily to prevent HIV infection. This is called pre-exposure prophylaxis (PrEP). You are considered at risk if: You are sexually active and do not regularly use condoms or know the HIV status of your partner(s). You take drugs by injection. You are sexually active with a partner who has HIV. Talk with your health care provider about whether you are at high risk of being infected with HIV. If you choose to begin PrEP, you should first be tested for HIV. You should then be tested every 3 months for as long as you are taking PrEP.  PREGNANCY  If you are premenopausal and you may become pregnant, ask your health care provider about preconception counseling. If you may become pregnant, take 400 to 800 micrograms (mcg) of folic acid every day. If you want to prevent pregnancy, talk to your health care provider about birth control (contraception). OSTEOPOROSIS AND MENOPAUSE  Osteoporosis is a disease in which the bones lose minerals and strength with aging. This  can result in serious bone fractures. Your risk for osteoporosis can be identified using a bone density scan. If you are 45 years of age or older, or if you are at risk for osteoporosis and fractures, ask your health care provider if you should be screened. Ask your health care provider whether you should take a calcium or vitamin D supplement to lower your risk for osteoporosis. Menopause may have certain physical symptoms and risks. Hormone replacement therapy may reduce some of these symptoms and risks. Talk to your health care provider about whether hormone replacement therapy is right for you.  HOME CARE INSTRUCTIONS  Schedule regular health, dental, and eye exams. Stay current with your immunizations.  Do not use any tobacco products including cigarettes, chewing tobacco, or electronic cigarettes. If you are pregnant, do not drink alcohol. If you are breastfeeding, limit how much and how often you drink alcohol. Limit alcohol intake to no more than 1 drink per day for nonpregnant women. One drink equals 12 ounces of beer, 5 ounces of wine, or 1 ounces of hard liquor. Do not use street drugs. Do not share needles. Ask your health care provider for help if you need  support or information about quitting drugs. Tell your health care provider if you often feel depressed. Tell your health care provider if you have ever been abused or do not feel safe at home

## 2014-12-07 NOTE — Progress Notes (Signed)
Patient ID: Debra White, female   DOB: 1934/03/28, 81 y.o.   MRN: [id number]  MEDICARE ANNUAL WELLNESS VISIT AND OV  Assessment:   1. Essential hypertension  - TSH  2. Hyperlipemia  - Lipid panel  3. Prediabetes  - Hemoglobin A1c - Insulin, random  4. Vitamin D deficiency  - Vit D  25 hydroxy (rtn osteoporosis monitoring)  5. Gastroesophageal reflux disease, esophagitis presence not specified   6. Fracture, zygoma closed, sequela   7. Chronic post-traumatic headache, not intractable   8. 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 Depression screen  - screen Negative  10. At low risk for fall   11. Medication management  - CBC with Differential/Platelet - BASIC METABOLIC PANEL WITH GFR - Hepatic function panel - Magnesium  12. Routine general medical examination at a health care facility  Plan:   During the course of the visit the patient was educated and counseled about appropriate screening and preventive services including:    Pneumococcal vaccine   Influenza vaccine  Td vaccine  Screening electrocardiogram  Bone densitometry screening  Colorectal cancer screening  Diabetes screening  Glaucoma screening  Nutrition counseling   Advanced directives: requested  Screening recommendations, referrals: Vaccinations:  Immunization History  Administered Date(s) Administered  . Influenza, High Dose Seasonal PF 03/25/2014  . Influenza-Unspecified 03/09/2013  . Pneumococcal Polysaccharide-23 06/25/2002  . Td 06/26/2003  Prevnar vaccine unavailable Hep B vaccine not indicated  Nutrition assessed and recommended  Colonoscopy 03/17/2012 Recommended yearly ophthalmology/optometry visit for glaucoma screening and checkup Recommended yearly dental visit for hygiene and checkup Advanced directives - yes  Conditions/risks identified: BMI: Discussed weight loss, diet, and increase physical activity.  Increase physical activity: AHA  recommends 150 minutes of physical activity a week.  Medications reviewed PreDiabetes is not at goal, ACE/ARB therapy: Yes. Urinary Incontinence is not an issue: discussed non pharmacology and pharmacology options.  Fall risk: low- discussed PT, home fall assessment, medications.   Subjective:     Debra White  presents for [employer] Visit and complete physical.  Date of last medicare wellness visit was 10/21/2013.  This very nice 81 y.o. MWF presents for 3 month follow up with Hypertension, Hyperlipidemia, Pre-Diabetes and Vitamin D Deficiency.     Patient fell Jan 2013 and sustained a right orbital fx and was seen by Dr Erie Noe. Patient has atypical right facial pain and has had Neuro consultation for chronic atypical right facial pain. She relates poor sense of smell & taste and consequent poor oral intake and loss of weight. She further relates that she has not felt well a single day since her accident. She also is being followed by Dr Sharol Given for chronic DDD/LBP and has been advised she does not have a surgical problem and she has had epidural steroid injection.    Patient is treated for HTN & BP has been controlled at home. Today's BP: 124/62 mmHg. Patient has had no complaints of any cardiac type chest pain, palpitations, dyspnea/orthopnea/PND, dizziness, claudication, or dependent edema.    Hyperlipidemia is controlled with diet & meds. Patient denies myalgias or other med SE's. Last Lipids were at goal - Chol 148; HDL 63; LDL  51; Triglycerides 172 on 09/02/2014.    Also, the patient has history of PreDiabetes since 2011 with A1c 6.5% and has had no symptoms of reactive hypoglycemia, diabetic polys, paresthesias or visual blurring.  Last A1c was 6.0% on 09/02/2014.    Further, the patient also  has history of Vitamin D Deficiency and supplements vitamin D without any suspected side-effects. Last vitamin D was 52 on 09/02/2014.  Names of Other Physician/Practitioners you  currently use: 1. [facility] here for primary care 2. Dr Talbert Forest, eye doctor, last visit Jan 2016  3. Dr Olena Heckle, [place], dentist, last visit Jan 2016  Patient Care Team: Unk Pinto, MD as PCP - General (Internal Medicine) Darleen Crocker, MD as Consulting Physician (Ophthalmology) Ladene Artist, MD as Consulting Physician (Gastroenterology) Cameron Sprang, MD as Consulting Physician (Neurology)  Medication Review: Current Outpatient Prescriptions on File Prior to Visit  Medication Sig Dispense Refill  . acetaminophen (TYLENOL ARTHRITIS PAIN) 650 MG CR tablet Take 650 mg by mouth every 8 (eight) hours as needed for pain.    Marland Kitchen atenolol (TENORMIN) 100 MG tablet Take 1 tablet (100 mg total) by mouth every morning. 90 tablet 1  . azelastine (ASTELIN) 0.1 % nasal spray Use 1 to 2 sprays each nostril 2 x day - morning & night for nasal drainage & congestion 30 mL 99  . cholecalciferol (VITAMIN D) 1000 UNITS tablet Take 2,000 Units by mouth daily.    . clonazePAM (KLONOPIN) 1 MG tablet TAKE 1/2-1 TABLET BY MOUTH UP TO 3 TIMESDAILY IF NEEDED FOR NERVES 90 tablet 5  . gabapentin (NEURONTIN) 100 MG capsule Take 2 to 3 capsules 3 x daily for facial pain 90 capsule 5  . gabapentin (NEURONTIN) 300 MG capsule Take 1 capsule 3 to 4 x day for facial pain 120 capsule 5  . hyoscyamine (LEVSIN SL) 0.125 MG SL tablet Dissolve one tablet by mouth every 4-6 hours as needed 30 tablet 3  . losartan (COZAAR) 100 MG tablet Take 0.5 tablets (50 mg total) by mouth daily. 90 tablet 3  . pravastatin (PRAVACHOL) 40 MG tablet Take 1 tablet (40 mg total) by mouth daily. 90 tablet 4  . Probiotic Product (FLORAJEN3 PO) Take by mouth daily.    . ranitidine (ZANTAC) 300 MG tablet Take 1 tablet 1 to 2 x day as needed for acid indigestion & redlux 180 tablet 99  . Simethicone (GAS RELIEF PO) Take by mouth daily. OTC    . [DISCONTINUED] omeprazole (PRILOSEC OTC) 20 MG tablet Take 20 mg by  mouth daily.     No current facility-administered medications on file prior to visit.    Current Problems (verified) Patient Active Problem List   Diagnosis Date Noted  . Prediabetes 01/27/2014  . Medication management 01/27/2014  . 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 Vitamin D deficiency 07/08/2013  . Hypertension   . Hyperlipemia   . GERD   . Headache, post-traumatic, chronic 09/20/2011  . Fracture, zygoma closed 07/13/2011    Screening Tests Health Maintenance  Topic Date Due  . ZOSTAVAX  12/30/1995  . PNA vac Low Risk Adult (2 of 2 - PCV13) 06/26/2003  . TETANUS/TDAP  06/25/2013  . INFLUENZA VACCINE  01/24/2015  . COLONOSCOPY  03/17/2017  . DEXA SCAN  Completed    Immunization History  Administered Date(s) Administered  . Influenza, High Dose Seasonal PF 03/25/2014  . Influenza-Unspecified 03/09/2013  . Pneumococcal Polysaccharide-23 06/25/2002  . Td 06/26/2003    Preventative care: Last colonoscopy: 03/17/2012  History reviewed: allergies, current medications, past family history, past medical history, past social history, past surgical history and problem list  Risk Factors: Tobacco History  Substance Use Topics  . Smoking status: Never Smoker   . Smokeless tobacco: Never Used  .  Alcohol Use: No   She does not smoke.  Patient is not a former smoker. Are there smokers in your home (other than you)?  No Alcohol Current alcohol use: none  Caffeine Current caffeine use: denies use  Exercise Current exercise: walking  Nutrition/Diet Current diet: in general, an "unhealthy" diet  Cardiac risk factors: advanced age (older than 33 for men, 56 for women) and family history of premature cardiovascular disease.  Depression Screen (Note: if answer to either of the following is "Yes", a more complete depression screening is indicated)   Q1: Over the past two weeks, have you felt down, depressed or hopeless? No  Q2: Over the past two weeks, have you felt  little interest or pleasure in doing things? No  Have you lost interest or pleasure in daily life? No  Do you often feel hopeless? No  Do you cry easily over simple problems? No  Activities of Daily Living In your present state of health, do you have any difficulty performing the following activities?:  Driving? No Managing money?  No Feeding yourself? No Getting from bed to chair? No Climbing a flight of stairs? No Preparing food and eating?: No Bathing or showering? No Getting dressed: No Getting to the toilet? No Using the toilet:No Moving around from place to place: No In the past year have you fallen or had a near fall?:No   Are you sexually active?  No  Do you have more than one partner?  No  Vision Difficulties: No  Hearing Difficulties: No Do you often ask people to speak up or repeat themselves? No Do you experience ringing or noises in your ears? No Do you have difficulty understanding soft or whispered voices? Sometimes.  Cognition  Do you feel that you have a problem with memory?No  Do you often misplace items? No  Do you feel safe at home?  Yes  Advanced directives Does patient have a [place]? Yes Does patient have a Living Will? Yes  Past Medical History  Diagnosis Date  . Fibromyalgia   . Anxiety   . Dysrhythmia     RX  . Blood transfusion   . Chronic kidney disease     STONES  . Headache(784.0)   . Arthritis   . Cataract   . Diverticulosis of colon (without mention of hemorrhage) 2007    Colonoscopy  . Candida esophagitis 2013    EGD  . Family history of colon cancer     sister  . Hypertension   . Hyperlipemia   . GERD (gastroesophageal reflux disease)   . Emphysema of lung    Past Surgical History  Procedure Laterality Date  . Abdominal hysterectomy    . Hand surgery      BIL   . Shoulder arthroscopy w/ rotator cuff repair      LFT  . Cervical disc surgery    . Back surgery      X2  . Orif tripod fracture   07/13/2011    Procedure: OPEN REDUCTION INTERNAL FIXATION (ORIF) TRIPOD FRACTURE;  Surgeon: Tyson Alias, MD;  Location: [facility];  Service: ENT;  Laterality: Right;  ORIF RIGHT ZYGOMA, ORBITAL FLOOR EXPLORATION WITH FROST STITCH (TEMPORARY TARSORRHAPHY)  . Colonoscopy    . Facial fracture surgery    . Back surgery     ROS: Constitutional: Denies fever, chills, weight loss/gain, headaches, insomnia, fatigue, night sweats, and change in appetite. Eyes: Denies redness, blurred vision, diplopia, discharge, itchy, watery eyes.  ENT: Denies discharge, congestion, post nasal drip, epistaxis, sore throat, earache, hearing loss, dental pain, Tinnitus, Vertigo, Sinus pain, snoring.  Cardio: Denies chest pain, palpitations, irregular heartbeat, syncope, dyspnea, diaphoresis, orthopnea, PND, claudication, edema Respiratory: denies cough, dyspnea, DOE, pleurisy, hoarseness, laryngitis, wheezing.  Gastrointestinal: Denies dysphagia, heartburn, reflux, water brash, pain, cramps, nausea, vomiting, bloating, diarrhea, constipation, hematemesis, melena, hematochezia, jaundice, hemorrhoids Genitourinary: Denies dysuria, frequency, urgency, nocturia, hesitancy, discharge, hematuria, flank pain Breast: Breast lumps, nipple discharge, bleeding.  Musculoskeletal: Denies arthralgia, myalgia, stiffness, Jt. Swelling, pain, limp, and strain/sprain. Denies falls. Skin: Denies puritis, rash, hives, warts, acne, eczema, changing in skin lesion Neuro: No weakness, tremor, incoordination, spasms, paresthesia, pain Psychiatric: Denies confusion, memory loss, sensory loss. Denies Depression. Endocrine: Denies change in weight, skin, hair change, nocturia, and paresthesia, diabetic polys, visual blurring, hyper / hypo glycemic episodes.  Heme/Lymph: No excessive bleeding, bruising, enlarged lymph nodes  Objective:     BP 124/62 mmHg  Pulse 60  Temp(Src) 97.5 F (36.4 C)  Resp 16  Ht 5' 4.5" (1.638 m)  Wt 100 lb 12.8 oz  (45.723 kg)  BMI 17.04 kg/m2  General Appearance: Well nourished, alert, WD/WN, female and in no apparent distress. Eyes: PERRLA, EOMs, conjunctiva no swelling or erythema, normal fundi and vessels. Sinuses: No frontal/maxillary tenderness ENT/Mouth: EACs patent / TMs  nl. Nares clear without erythema, swelling, mucoid exudates. Oral hygiene is good. No erythema, swelling, or exudate. Tongue normal, non-obstructing. Tonsils not swollen or erythematous. Hearing normal.  Neck: Supple, thyroid normal. No bruits, nodes or JVD. Respiratory: Respiratory effort normal.  BS equal and clear bilateral without rales, rhonci, wheezing or stridor. Cardio: Heart sounds are normal with regular rate and rhythm and no murmurs, rubs or gallops. Peripheral pulses are normal and equal bilaterally without edema. No aortic or femoral bruits. Chest: symmetric with normal excursions and percussion. Breasts: Symmetric, without lumps, nipple discharge, retractions, or fibrocystic changes.  Abdomen: Flat, soft  with nl bowel sounds. Nontender, no guarding, rebound, hernias, masses, or organomegaly.  Lymphatics: Non tender without lymphadenopathy.  Genitourinary:  Musculoskeletal: Full ROM all peripheral extremities, joint stability, 5/5 strength, and normal gait. Skin: Warm and dry without rashes, lesions, cyanosis, clubbing or  ecchymosis.  Neuro: Cranial nerves intact, reflexes equal bilaterally. Normal muscle tone, no cerebellar symptoms. Sensation intact.  Pysch: Alert and oriented X 3, normal affect, Insight and Judgment appropriate.   Cognitive Testing  Alert? Yes  Normal Appearance?Yes  Oriented to person? Yes  Place? Yes   Time? Yes  Recall of three objects?  Yes  Can perform simple calculations? Yes  Displays appropriate judgment? Yes  Can read the correct time from a watch/clock?Yes  Medicare Attestation I have personally reviewed: The patient's medical and social history Their use of alcohol, tobacco  or illicit drugs Their current medications and supplements The patient's functional ability including ADLs,fall risks, home safety risks, cognitive, and hearing and visual impairment Diet and physical activities Evidence for depression or mood disorders  The patient's weight, height, BMI, and visual acuity have been recorded in the chart.  I have made referrals, counseling, and provided education to the patient based on review of the above and I have provided the patient with a written personalized care plan for preventive services.  Over 40 minutes of exam, counseling, chart review was performed.  Gryffin Altice DAVID, MD   12/07/2014

## 2014-12-08 LAB — VITAMIN D 25 HYDROXY (VIT D DEFICIENCY, FRACTURES): Vit D, 25-Hydroxy: 68 ng/mL (ref 30–100)

## 2014-12-08 LAB — INSULIN, RANDOM: Insulin: 12 u[IU]/mL (ref 2.0–19.6)

## 2014-12-21 ENCOUNTER — Other Ambulatory Visit: Payer: Self-pay | Admitting: Internal Medicine

## 2014-12-24 ENCOUNTER — Other Ambulatory Visit: Payer: Self-pay | Admitting: Internal Medicine

## 2014-12-24 DIAGNOSIS — N6310 Unspecified lump in the right breast, unspecified quadrant: Secondary | ICD-10-CM

## 2015-01-24 ENCOUNTER — Ambulatory Visit (INDEPENDENT_AMBULATORY_CARE_PROVIDER_SITE_OTHER): Payer: PPO | Admitting: Internal Medicine

## 2015-01-24 VITALS — Ht 64.0 in | Wt 100.0 lb

## 2015-01-24 DIAGNOSIS — M2662 Arthralgia of temporomandibular joint: Secondary | ICD-10-CM

## 2015-01-24 DIAGNOSIS — M26629 Arthralgia of temporomandibular joint, unspecified side: Secondary | ICD-10-CM

## 2015-01-24 DIAGNOSIS — H04121 Dry eye syndrome of right lacrimal gland: Secondary | ICD-10-CM

## 2015-01-24 MED ORDER — PREDNISONE 20 MG PO TABS: ORAL_TABLET | ORAL | Status: DC

## 2015-01-24 NOTE — Patient Instructions (Signed)
Temporomandibular Problems  Temporomandibular joint (TMJ) dysfunction means there are problems with the joint between your jaw and your skull. This is a joint lined by cartilage like other joints in your body but also has a small disc in the joint which keeps the bones from rubbing on each other. These joints are like other joints and can get inflamed (sore) from arthritis and other problems. When this joint gets sore, it can cause headaches and pain in the jaw and the face. CAUSES  Usually the arthritic types of problems are caused by soreness in the joint. Soreness in the joint can also be caused by overuse. This may come from grinding your teeth. It may also come from mis-alignment in the joint. DIAGNOSIS Diagnosis of this condition can often be made by history and exam. Sometimes your caregiver may need X-rays or an MRI scan to determine the exact cause. It may be necessary to see your dentist to determine if your teeth and jaws are lined up correctly. TREATMENT  Most of the time this problem is not serious; however, sometimes it can persist (become chronic). When this happens medications that will cut down on inflammation (soreness) help. Sometimes a shot of cortisone into the joint will be helpful. If your teeth are not aligned it may help for your dentist to make a splint for your mouth that can help this problem. If no physical problems can be found, the problem may come from tension. If tension is found to be the cause, biofeedback or relaxation techniques may be helpful. HOME CARE INSTRUCTIONS   Later in the day, applications of ice packs may be helpful. Ice can be used in a plastic bag with a towel around it to prevent frostbite to skin. This may be used about every 2 hours for 20 to 30 minutes, as needed while awake, or as directed by your caregiver.  Only take over-the-counter or prescription medicines for pain, discomfort, or fever as directed by your caregiver.  If physical therapy was  prescribed, follow your caregiver's directions.  Wear mouth appliances as directed if they were given. Document Released: 03/06/2001 Document Revised: 09/03/2011 Document Reviewed: 06/13/2008 ExitCare Patient Information 2015 [employer]. This information is not intended to replace advice given to you by your health care provider. Make sure you discuss any questions you have with your health care provider.  

## 2015-01-24 NOTE — Progress Notes (Signed)
   Subjective:    Patient ID: Debra White, female    DOB: Oct 30, 1933, 81 y.o.   MRN: [id number]  Eye Pain  Pertinent negatives include no eye discharge, eye redness, fever or photophobia.  Patient presents to the office for evaluation of right eye pain and right ear pain.  She reports that she recently had her ears cleaned and she reports that the nerves in her face are irritated and she reports that the pain is annoying.  She also reports that the right eye is dry.  She has been using refresh drops which is helping.  She reports that she is concerned that she is having some swelling.  She does not feel that the eye has changed at all since her fall and previous surgery.  She reports that she had her ears cleaned out at the audiologist clinic.  She reports that it was done at the same place she gets her hearing aids done.     Review of Systems  Constitutional: Positive for chills. Negative for fever and fatigue.  HENT: Positive for congestion. Negative for facial swelling, postnasal drip, rhinorrhea, sinus pressure, sore throat, trouble swallowing and voice change.   Eyes: Positive for pain. Negative for photophobia, discharge, redness, itching and visual disturbance.  Skin: Negative for rash.       Objective:   Physical Exam  Constitutional: She is oriented to person, place, and time. She appears well-developed and well-nourished. No distress.  HENT:  Head: Normocephalic and atraumatic.  Mouth/Throat: Oropharynx is clear and moist. No oropharyngeal exudate.  Eyes: Conjunctivae, EOM and lids are normal. Pupils are equal, round, and reactive to light. Lids are everted and swept, no foreign bodies found. Right eye exhibits no chemosis, no discharge, no exudate and no hordeolum. No foreign body present in the right eye. Left eye exhibits no chemosis, no discharge, no exudate and no hordeolum. No foreign body present in the left eye. Right conjunctiva is not injected. Right conjunctiva has no  hemorrhage. Left conjunctiva is not injected. Left conjunctiva has no hemorrhage. No scleral icterus. Right eye exhibits normal extraocular motion and no nystagmus. Left eye exhibits normal extraocular motion and no nystagmus.  Neck: Normal range of motion. Neck supple. No JVD present. No thyromegaly present.  Cardiovascular: Normal rate, regular rhythm, normal heart sounds and intact distal pulses.  Exam reveals no gallop and no friction rub.   No murmur heard. Pulmonary/Chest: Effort normal and breath sounds normal. No respiratory distress. She has no wheezes. She has no rales. She exhibits no tenderness.  Musculoskeletal: Normal range of motion.  Lymphadenopathy:    She has no cervical adenopathy.  Neurological: She is alert and oriented to person, place, and time.  Skin: Skin is warm and dry. She is not diaphoretic.  Psychiatric: She has a normal mood and affect. Her behavior is normal. Judgment and thought content normal.  Nursing note and vitals reviewed.         Assessment & Plan:    1. TMJ arthralgia -prednisone  2. Eye dryness, right -refresh eye drops -no evidence of infection currently

## 2015-02-01 ENCOUNTER — Ambulatory Visit
Admission: RE | Admit: 2015-02-01 | Discharge: 2015-02-01 | Disposition: A | Discharge status: Home / Self Care | Payer: PPO | Source: Ambulatory Visit | Attending: Internal Medicine | Admitting: Internal Medicine

## 2015-02-01 DIAGNOSIS — N6310 Unspecified lump in the right breast, unspecified quadrant: Secondary | ICD-10-CM

## 2015-03-08 ENCOUNTER — Ambulatory Visit: Payer: Self-pay | Admitting: Internal Medicine

## 2015-03-09 ENCOUNTER — Ambulatory Visit: Payer: Self-pay | Admitting: Physician Assistant

## 2015-03-10 ENCOUNTER — Ambulatory Visit (INDEPENDENT_AMBULATORY_CARE_PROVIDER_SITE_OTHER): Payer: PPO | Admitting: Physician Assistant

## 2015-03-10 ENCOUNTER — Encounter: Payer: Self-pay | Admitting: Physician Assistant

## 2015-03-10 VITALS — BP 100/60 | HR 58 | Temp 97.9°F | Resp 16 | Ht 64.5 in | Wt 100.6 lb

## 2015-03-10 DIAGNOSIS — R7303 Prediabetes: Secondary | ICD-10-CM

## 2015-03-10 DIAGNOSIS — R7309 Other abnormal glucose: Secondary | ICD-10-CM | POA: Diagnosis not present

## 2015-03-10 DIAGNOSIS — I10 Essential (primary) hypertension: Secondary | ICD-10-CM

## 2015-03-10 DIAGNOSIS — E559 Vitamin D deficiency, unspecified: Secondary | ICD-10-CM

## 2015-03-10 DIAGNOSIS — E46 Unspecified protein-calorie malnutrition: Secondary | ICD-10-CM

## 2015-03-10 DIAGNOSIS — E785 Hyperlipidemia, unspecified: Secondary | ICD-10-CM | POA: Diagnosis not present

## 2015-03-10 DIAGNOSIS — G501 Atypical facial pain: Secondary | ICD-10-CM

## 2015-03-10 LAB — HEPATIC FUNCTION PANEL
ALK PHOS: 59 U/L (ref 33–130)
ALT: 21 U/L (ref 6–29)
AST: 23 U/L (ref 10–35)
Albumin: 4.3 g/dL (ref 3.6–5.1)
BILIRUBIN DIRECT: 0.1 mg/dL (ref <=0.2)
BILIRUBIN INDIRECT: 0.4 mg/dL (ref 0.2–1.2)
BILIRUBIN TOTAL: 0.5 mg/dL (ref 0.2–1.2)
TOTAL PROTEIN: 7.1 g/dL (ref 6.1–8.1)

## 2015-03-10 LAB — CBC WITH DIFFERENTIAL/PLATELET
Basophils Absolute: 0 10*3/uL (ref 0.0–0.1)
Basophils Relative: 0 % (ref 0–1)
EOS ABS: 0.2 10*3/uL (ref 0.0–0.7)
Eosinophils Relative: 2 % (ref 0–5)
HCT: 37 % (ref 36.0–46.0)
Hemoglobin: 12.9 g/dL (ref 12.0–15.0)
LYMPHS ABS: 1.5 10*3/uL (ref 0.7–4.0)
Lymphocytes Relative: 14 % (ref 12–46)
MCH: 31.9 pg (ref 26.0–34.0)
MCHC: 34.9 g/dL (ref 30.0–36.0)
MCV: 91.6 fL (ref 78.0–100.0)
MONOS PCT: 11 % (ref 3–12)
MPV: 9.2 fL (ref 8.6–12.4)
Monocytes Absolute: 1.1 10*3/uL — ABNORMAL HIGH (ref 0.1–1.0)
Neutro Abs: 7.6 10*3/uL (ref 1.7–7.7)
Neutrophils Relative %: 73 % (ref 43–77)
Platelets: 271 10*3/uL (ref 150–400)
RBC: 4.04 MIL/uL (ref 3.87–5.11)
RDW: 13.6 % (ref 11.5–15.5)
WBC: 10.4 10*3/uL (ref 4.0–10.5)

## 2015-03-10 LAB — LIPID PANEL
CHOL/HDL RATIO: 2.1 ratio (ref <=5.0)
CHOLESTEROL: 174 mg/dL (ref 125–200)
HDL: 81 mg/dL (ref >=46)
LDL CALC: 54 mg/dL (ref <130)
TRIGLYCERIDES: 195 mg/dL — AB (ref <150)
VLDL: 39 mg/dL — AB (ref <30)

## 2015-03-10 LAB — BASIC METABOLIC PANEL WITH GFR
BUN: 18 mg/dL (ref 7–25)
CALCIUM: 9.6 mg/dL (ref 8.6–10.4)
CO2: 30 mmol/L (ref 20–31)
Chloride: 95 mmol/L — ABNORMAL LOW (ref 98–110)
Creat: 0.63 mg/dL (ref 0.60–0.93)
GFR, EST NON AFRICAN AMERICAN: 86 mL/min (ref >=60)
GLUCOSE: 91 mg/dL (ref 65–99)
POTASSIUM: 4.9 mmol/L (ref 3.5–5.3)
SODIUM: 135 mmol/L (ref 135–146)

## 2015-03-10 LAB — MAGNESIUM: MAGNESIUM: 1.9 mg/dL (ref 1.5–2.5)

## 2015-03-10 NOTE — Progress Notes (Signed)
Assessment and Plan:  Hypertension: soft BP today, no symptoms but suggest cutting atenolol in half, monitor blood pressure at home. Continue DASH diet.  Reminder to go to the ER if any CP, SOB, nausea, dizziness, severe HA, changes vision/speech, left arm numbness and tingling, and jaw pain. Cholesterol: Continue diet and exercise. Check cholesterol.  Pre-diabetes-Continue diet and exercise. Check A1C- long discussion about preDM, has some anxiety about food due to this, reassured patient that only PreDM and while we want her to eat healthy that eating is much more important.  Vitamin D Def- check level and continue medications.  Malnutrition- get on boost originial.   Continue diet and meds as discussed. Further disposition pending results of labs.  HPI 81 y.o. female  presents for 6 month follow up with hypertension, hyperlipidemia, prediabetes and vitamin D. Her blood pressure has been controlled at home, today their BP is BP: 100/60 mmHg She does not workout. She denies chest pain, shortness of breath, dizziness.  She is on cholesterol medication and denies myalgias. Her cholesterol is at goal. The cholesterol last visit was:   Lab Results  Component Value Date   CHOL 168 12/07/2014   HDL 96 12/07/2014   LDLCALC 57 12/07/2014   TRIG 75 12/07/2014   CHOLHDL 1.8 12/07/2014   She has been working on diet and exercise for prediabetes, and denies polydipsia, polyuria and visual disturbances. Last A1C in the office was:  Lab Results  Component Value Date   HGBA1C 5.7* 12/07/2014   Patient is on Vitamin D supplement.   Lab Results  Component Value Date   VD25OH 68 12/07/2014     She has facial pain/neuraliga, follows with Dr. Delice Lesch, and is on lidocaine ointment, klonopin and gabapentin which helps some.  She is very frustrated with her weight, she has been on DM boost but has not been gaining weight.  Wt Readings from Last 3 Encounters:  03/10/15 100 lb 9.6 oz (45.632 kg)  01/24/15  100 lb (45.36 kg)  12/07/14 100 lb 12.8 oz (45.723 kg)    Current Medications:  Current Outpatient Prescriptions on File Prior to Visit  Medication Sig Dispense Refill  . acetaminophen (TYLENOL ARTHRITIS PAIN) 650 MG CR tablet Take 650 mg by mouth every 8 (eight) hours as needed for pain.    Marland Kitchen atenolol (TENORMIN) 100 MG tablet TAKE 1 TABLET BY MOUTH EVERY MORNING 90 tablet 3  . azelastine (ASTELIN) 0.1 % nasal spray Use 1 to 2 sprays each nostril 2 x day - morning & night for nasal drainage & congestion 30 mL 99  . cholecalciferol (VITAMIN D) 1000 UNITS tablet Take 2,000 Units by mouth daily.    . clonazePAM (KLONOPIN) 1 MG tablet TAKE 1/2-1 TABLET BY MOUTH UP TO 3 TIMESDAILY IF NEEDED FOR NERVES 90 tablet 5  . gabapentin (NEURONTIN) 300 MG capsule Take 1 capsule 3 to 4 x day for facial pain 120 capsule 5  . hyoscyamine (LEVSIN SL) 0.125 MG SL tablet Dissolve one tablet by mouth every 4-6 hours as needed 30 tablet 3  . losartan (COZAAR) 100 MG tablet Take 0.5 tablets (50 mg total) by mouth daily. 90 tablet 3  . pravastatin (PRAVACHOL) 40 MG tablet Take 1 tablet (40 mg total) by mouth daily. 90 tablet 4  . Probiotic Product (FLORAJEN3 PO) Take by mouth daily.    . ranitidine (ZANTAC) 300 MG tablet Take 1 tablet 1 to 2 x day as needed for acid indigestion & redlux 180 tablet 99  .  Simethicone (GAS RELIEF PO) Take by mouth daily. OTC    . [DISCONTINUED] omeprazole (PRILOSEC OTC) 20 MG tablet Take 20 mg by mouth daily.     No current facility-administered medications on file prior to visit.   Medical History:  Past Medical History  Diagnosis Date  . Fibromyalgia   . Anxiety   . Dysrhythmia     RX  . Blood transfusion   . Chronic kidney disease     STONES  . Headache(784.0)   . Arthritis   . Cataract   . Diverticulosis of colon (without mention of hemorrhage) 2007    Colonoscopy  . Candida esophagitis 2013    EGD  . Family history of colon cancer     sister  . Hypertension   .  Hyperlipemia   . GERD (gastroesophageal reflux disease)   . Emphysema of lung    Allergies:  Allergies  Allergen Reactions  . Latex Hives    TONGUE HAD BLISTERS WHEN HAD DENTAL SURGERY  . Amoxicillin Nausea And Vomiting  . Chlorhexidine Gluconate Itching  . Aspirin     Gi upset  . Barbiturates   . Celebrex [Celecoxib]   . Codeine Itching  . Evista [Raloxifene]   . Lipitor [Atorvastatin]   . Morphine And Related   . Nsaids   . Oruvail [Ketoprofen]   . Paraffin   . Prochlorperazine Other (See Comments)    Uncontrolled shaking  . Proloprim [Trimethoprim]   . Vibra-Tab [Doxycycline]   . Vioxx [Rofecoxib]     edema  . Betadine [Povidone Iodine] Rash  . Caffeine Palpitations   Review of Systems:  Review of Systems  Constitutional: Positive for weight loss and malaise/fatigue. Negative for fever, chills and diaphoresis.  HENT: Negative for congestion, ear discharge, ear pain, hearing loss, nosebleeds, sore throat and tinnitus.   Eyes: Negative.   Respiratory: Negative.  Negative for stridor.   Cardiovascular: Negative.   Gastrointestinal: Negative.   Genitourinary: Negative.   Musculoskeletal: Positive for myalgias and back pain. Negative for joint pain, falls and neck pain.  Skin: Negative.   Neurological: Positive for headaches. Negative for dizziness, tingling, tremors, sensory change, speech change, focal weakness, seizures, loss of consciousness and weakness.  Psychiatric/Behavioral: Negative.     Family history- Review and unchanged Social history- Review and unchanged Physical Exam: BP 100/60 mmHg  Pulse 58  Temp(Src) 97.9 F (36.6 C)  Resp 16  Ht 5' 4.5" (1.638 m)  Wt 100 lb 9.6 oz (45.632 kg)  BMI 17.01 kg/m2 Wt Readings from Last 3 Encounters:  03/10/15 100 lb 9.6 oz (45.632 kg)  01/24/15 100 lb (45.36 kg)  12/07/14 100 lb 12.8 oz (45.723 kg)   General Appearance: Thin,frail, in no apparent distress. Eyes: PERRLA, EOMs, conjunctiva no swelling or  erythema Sinuses: No Frontal/maxillary tenderness ENT/Mouth: Ext aud canals clear, TMs without erythema, bulging. No erythema, swelling, or exudate on post pharynx.  Tonsils not swollen or erythematous. Hearing normal.  Neck: Supple, thyroid normal.  Respiratory: Respiratory effort normal, BS equal bilaterally without rales, rhonchi, wheezing or stridor.  Cardio: RRR with no MRGs. Brisk peripheral pulses without edema.  Abdomen: Soft, + BS.  Non tender, no guarding, rebound, hernias, masses. Lymphatics: Non tender without lymphadenopathy.  Musculoskeletal: Full ROM, 5/5 strength, normal gait.  Skin: Warm, dry without rashes, lesions, ecchymosis.  Neuro: Cranial nerves intact. Normal muscle tone, no cerebellar symptoms. Sensation intact.  Psych: Awake and oriented X 3, normal affect, Insight and Judgment appropriate.  Vicie Mutters, PA-C 10:52 AM [facility]

## 2015-03-10 NOTE — Patient Instructions (Addendum)
Please drink the regular boost at least daily  Please cut the atenolol 100mg  in half- only take 1/2 pill daily If you get fast heart rate get back on the atenolol 1 pill  Please monitor your blood pressure, as we get older our body can not respond to a low blood pressure as well as it did when we were younger, for this reason we want a bit higher of a blood pressure as you get older to avoid dizziness and fatigue which can lead to falls. Pease call if your blood pressure is consistently above 150/90.

## 2015-03-11 LAB — TSH: TSH: 0.71 u[IU]/mL (ref 0.350–4.500)

## 2015-03-11 LAB — VITAMIN D 25 HYDROXY (VIT D DEFICIENCY, FRACTURES): Vit D, 25-Hydroxy: 57 ng/mL (ref 30–100)

## 2015-03-11 LAB — HEMOGLOBIN A1C
Hgb A1c MFr Bld: 5.7 % — ABNORMAL HIGH (ref <5.7)
Mean Plasma Glucose: 117 mg/dL — ABNORMAL HIGH (ref <117)

## 2015-04-08 ENCOUNTER — Encounter: Payer: Self-pay | Admitting: Physician Assistant

## 2015-04-08 ENCOUNTER — Ambulatory Visit (INDEPENDENT_AMBULATORY_CARE_PROVIDER_SITE_OTHER): Payer: PPO | Admitting: Physician Assistant

## 2015-04-08 VITALS — BP 130/70 | HR 55 | Temp 97.0°F | Resp 14 | Ht 64.5 in | Wt 105.0 lb

## 2015-04-08 DIAGNOSIS — Z8719 Personal history of other diseases of the digestive system: Secondary | ICD-10-CM

## 2015-04-08 DIAGNOSIS — R350 Frequency of micturition: Secondary | ICD-10-CM

## 2015-04-08 DIAGNOSIS — R1032 Left lower quadrant pain: Secondary | ICD-10-CM

## 2015-04-08 DIAGNOSIS — E441 Mild protein-calorie malnutrition: Secondary | ICD-10-CM

## 2015-04-08 DIAGNOSIS — Z1331 Encounter for screening for depression: Secondary | ICD-10-CM

## 2015-04-08 DIAGNOSIS — Z0000 Encounter for general adult medical examination without abnormal findings: Secondary | ICD-10-CM

## 2015-04-08 LAB — CBC WITH DIFFERENTIAL/PLATELET
BASOS ABS: 0 10*3/uL (ref 0.0–0.1)
BASOS PCT: 0 % (ref 0–1)
Eosinophils Absolute: 0.2 10*3/uL (ref 0.0–0.7)
Eosinophils Relative: 2 % (ref 0–5)
HCT: 36.5 % (ref 36.0–46.0)
HEMOGLOBIN: 11.9 g/dL — AB (ref 12.0–15.0)
LYMPHS ABS: 1.6 10*3/uL (ref 0.7–4.0)
Lymphocytes Relative: 18 % (ref 12–46)
MCH: 31 pg (ref 26.0–34.0)
MCHC: 32.6 g/dL (ref 30.0–36.0)
MCV: 95.1 fL (ref 78.0–100.0)
MONOS PCT: 10 % (ref 3–12)
MPV: 9.9 fL (ref 8.6–12.4)
Monocytes Absolute: 0.9 10*3/uL (ref 0.1–1.0)
NEUTROS ABS: 6.2 10*3/uL (ref 1.7–7.7)
NEUTROS PCT: 70 % (ref 43–77)
PLATELETS: 247 10*3/uL (ref 150–400)
RBC: 3.84 MIL/uL — ABNORMAL LOW (ref 3.87–5.11)
RDW: 13.5 % (ref 11.5–15.5)
WBC: 8.8 10*3/uL (ref 4.0–10.5)

## 2015-04-08 LAB — HEPATIC FUNCTION PANEL
ALK PHOS: 61 U/L (ref 33–130)
ALT: 15 U/L (ref 6–29)
AST: 19 U/L (ref 10–35)
Albumin: 4.2 g/dL (ref 3.6–5.1)
BILIRUBIN DIRECT: 0.1 mg/dL (ref <=0.2)
BILIRUBIN INDIRECT: 0.4 mg/dL (ref 0.2–1.2)
BILIRUBIN TOTAL: 0.5 mg/dL (ref 0.2–1.2)
Total Protein: 6.9 g/dL (ref 6.1–8.1)

## 2015-04-08 LAB — BASIC METABOLIC PANEL WITH GFR
BUN: 14 mg/dL (ref 7–25)
CHLORIDE: 93 mmol/L — AB (ref 98–110)
CO2: 31 mmol/L (ref 20–31)
CREATININE: 0.69 mg/dL (ref 0.60–0.93)
Calcium: 9.2 mg/dL (ref 8.6–10.4)
GFR, Est African American: >89 mL/min (ref >=60)
GFR, Est Non African American: 83 mL/min (ref >=60)
Glucose, Bld: 76 mg/dL (ref 65–99)
POTASSIUM: 5.4 mmol/L — AB (ref 3.5–5.3)
SODIUM: 130 mmol/L — AB (ref 135–146)

## 2015-04-08 MED ORDER — METRONIDAZOLE 500 MG PO TABS: 500.0000 mg | ORAL_TABLET | Freq: Three times a day (TID) | ORAL | Status: DC

## 2015-04-08 MED ORDER — LEVOFLOXACIN 500 MG PO TABS: 500.0000 mg | ORAL_TABLET | Freq: Every day | ORAL | Status: DC

## 2015-04-08 NOTE — Patient Instructions (Signed)
Do bowel rest over the weekend, avoid fiber, avoid milk Okay to do soups, jelllo, white bread, crackers, potatoes, etc  Diverticulitis Diverticulitis is inflammation or infection of small pouches in your colon that form when you have a condition called diverticulosis. The pouches in your colon are called diverticula. Your colon, or large intestine, is where water is absorbed and stool is formed. Complications of diverticulitis can include:  Bleeding.  Severe infection.  Severe pain.  Perforation of your colon.  Obstruction of your colon. CAUSES  Diverticulitis is caused by bacteria. Diverticulitis happens when stool becomes trapped in diverticula. This allows bacteria to grow in the diverticula, which can lead to inflammation and infection. RISK FACTORS People with diverticulosis are at risk for diverticulitis. Eating a diet that does not include enough fiber from fruits and vegetables may make diverticulitis more likely to develop. SYMPTOMS  Symptoms of diverticulitis may include:  Abdominal pain and tenderness. The pain is normally located on the left side of the abdomen, but may occur in other areas.  Fever and chills.  Bloating.  Cramping.  Nausea.  Vomiting.  Constipation.  Diarrhea.  Blood in your stool. DIAGNOSIS  Your health care provider will ask you about your medical history and do a physical exam. You may need to have tests done because many medical conditions can cause the same symptoms as diverticulitis. Tests may include:  Blood tests.  Urine tests.  Imaging tests of the abdomen, including X-rays and CT scans. When your condition is under control, your health care provider may recommend that you have a colonoscopy. A colonoscopy can show how severe your diverticula are and whether something else is causing your symptoms. TREATMENT  Most cases of diverticulitis are mild and can be treated at home. Treatment may include:  Taking over-the-counter pain  medicines.  Following a clear liquid diet.  Taking antibiotic medicines by mouth for 7-10 days. More severe cases may be treated at a hospital. Treatment may include:  Not eating or drinking.  Taking prescription pain medicine.  Receiving antibiotic medicines through an IV tube.  Receiving fluids and nutrition through an IV tube.  Surgery. HOME CARE INSTRUCTIONS   Follow your health care provider's instructions carefully.  Follow a full liquid diet or other diet as directed by your health care provider. After your symptoms improve, your health care provider may tell you to change your diet. He or she may recommend you eat a high-fiber diet. Fruits and vegetables are good sources of fiber. Fiber makes it easier to pass stool.  Take fiber supplements or probiotics as directed by your health care provider.  Only take medicines as directed by your health care provider.  Keep all your follow-up appointments. SEEK MEDICAL CARE IF:   Your pain does not improve.  You have a hard time eating food.  Your bowel movements do not return to normal. SEEK IMMEDIATE MEDICAL CARE IF:   Your pain becomes worse.  Your symptoms do not get better.  Your symptoms suddenly get worse.  You have a fever.  You have repeated vomiting.  You have bloody or black, tarry stools. MAKE SURE YOU:   Understand these instructions.  Will watch your condition.  Will get help right away if you are not doing well or get worse.   This information is not intended to replace advice given to you by your health care provider. Make sure you discuss any questions you have with your health care provider.   Document Released: 03/21/2005 Document  Revised: 06/16/2013 Document Reviewed: 05/06/2013 Elsevier Interactive Patient Education [employer].

## 2015-04-08 NOTE — Progress Notes (Addendum)
Subjective:    Patient ID: Debra White, female    DOB: 06/10/1934, 81 y.o.   MRN: [id number]  HPI 81 y.o. WF with history of diverticulitis with abscess in 2014 presents with AB pain. She has lower AB pain x 1 week, was disrupting her sleep, took levsin which helped, she had diarrhea x 1 day this week, no mucus, blood, dark black stool. She has nocturia, no dysuria. She has lower back pain, chronic. She has chills but denies fever. Has decreased appetite but has been using boost recently and has gained 5 lbs.   Blood pressure 130/70, pulse 55, temperature 97 F (36.1 C), temperature source Temporal, resp. rate 14, height 5' 4.5" (1.638 m), weight 105 lb (47.628 kg), SpO2 98 %.  Past Medical History  Diagnosis Date  . Fibromyalgia   . Anxiety   . Dysrhythmia     RX  . Blood transfusion   . Chronic kidney disease     STONES  . Headache(784.0)   . Arthritis   . Cataract   . Diverticulosis of colon (without mention of hemorrhage) 2007    Colonoscopy  . Candida esophagitis ([place]) 2013    EGD  . Family history of colon cancer     sister  . Hypertension   . Hyperlipemia   . GERD (gastroesophageal reflux disease)   . Emphysema of lung [facility])    Current Outpatient Prescriptions on File Prior to Visit  Medication Sig Dispense Refill  . acetaminophen (TYLENOL ARTHRITIS PAIN) 650 MG CR tablet Take 650 mg by mouth every 8 (eight) hours as needed for pain.    Marland Kitchen atenolol (TENORMIN) 100 MG tablet TAKE 1 TABLET BY MOUTH EVERY MORNING 90 tablet 3  . azelastine (ASTELIN) 0.1 % nasal spray Use 1 to 2 sprays each nostril 2 x day - morning & night for nasal drainage & congestion 30 mL 99  . cholecalciferol (VITAMIN D) 1000 UNITS tablet Take 2,000 Units by mouth daily.    . clonazePAM (KLONOPIN) 1 MG tablet TAKE 1/2-1 TABLET BY MOUTH UP TO 3 TIMESDAILY IF NEEDED FOR NERVES 90 tablet 5  . hyoscyamine (LEVSIN SL) 0.125 MG SL tablet Dissolve one tablet by mouth every 4-6 hours as needed 30 tablet 3   . losartan (COZAAR) 100 MG tablet Take 0.5 tablets (50 mg total) by mouth daily. 90 tablet 3  . pravastatin (PRAVACHOL) 40 MG tablet Take 1 tablet (40 mg total) by mouth daily. 90 tablet 4  . Probiotic Product (FLORAJEN3 PO) Take by mouth daily.    . ranitidine (ZANTAC) 300 MG tablet Take 1 tablet 1 to 2 x day as needed for acid indigestion & redlux 180 tablet 99  . Simethicone (GAS RELIEF PO) Take by mouth daily. OTC    . [DISCONTINUED] omeprazole (PRILOSEC OTC) 20 MG tablet Take 20 mg by mouth daily.     No current facility-administered medications on file prior to visit.    Review of Systems  Constitutional: Positive for fatigue. Negative for fever, chills and diaphoresis.  HENT: Negative.   Respiratory: Negative.  Negative for cough.   Cardiovascular: Negative.   Gastrointestinal: Positive for abdominal pain and diarrhea. Negative for nausea, vomiting, constipation, blood in stool, abdominal distention, anal bleeding and rectal pain.  Genitourinary: Negative.   Musculoskeletal: Positive for back pain and arthralgias. Negative for myalgias, joint swelling, gait problem, neck pain and neck stiffness.  Skin: Negative.   Neurological: Negative for dizziness and headaches.  Objective:   Physical Exam  Constitutional: She is oriented to person, place, and time. She appears well-developed. She appears cachectic. No distress.  HENT:  Head: Normocephalic and atraumatic.  Eyes: Conjunctivae are normal. Pupils are equal, round, and reactive to light.  Neck: Normal range of motion. Neck supple.  Cardiovascular: Normal rate, regular rhythm and normal heart sounds.   Pulmonary/Chest: Effort normal and breath sounds normal. She has no wheezes. She has no rales. She exhibits no tenderness.  Abdominal: Soft. Bowel sounds are increased. There is tenderness in the left lower quadrant. There is no rigidity, no rebound, no guarding, no CVA tenderness and no tenderness at McBurney's point.  Neg  psoas/obturator signs, + pain on the LLQ with palpation of RLQ.   Musculoskeletal: Normal range of motion. She exhibits no edema or tenderness.  Lymphadenopathy:    She has no cervical adenopathy.  Neurological: She is alert and oriented to person, place, and time. No cranial nerve deficit. Coordination normal.  Skin: Skin is warm and dry. No rash noted.      Assessment & Plan:  Generalized pain, worse LLQ, no rebound, no peritoneal signs-   Treat outpatient, get labs Levquin/Flaygl, (did not do well on cipro in the past), liquid diet/bland diet, continue fluids, PPI sample Follow up closely next week, will get CT AB with history of diverticulitis with abscess.  If worse pain please go to the ER  Depression screen negative  Body mass index is 17.75 kg/(m^2).- malnutrition- continue boost.

## 2015-04-09 LAB — URINALYSIS, ROUTINE W REFLEX MICROSCOPIC
BILIRUBIN URINE: NEGATIVE
GLUCOSE, UA: NEGATIVE
Hgb urine dipstick: NEGATIVE
KETONES UR: NEGATIVE
Leukocytes, UA: NEGATIVE
Nitrite: NEGATIVE
PH: 5 (ref 5.0–8.0)
Protein, ur: NEGATIVE
SPECIFIC GRAVITY, URINE: 1.024 (ref 1.001–1.035)

## 2015-04-09 LAB — URINE CULTURE
Colony Count: NO GROWTH
Organism ID, Bacteria: NO GROWTH

## 2015-04-13 ENCOUNTER — Encounter (HOSPITAL_COMMUNITY): Payer: Self-pay

## 2015-04-13 ENCOUNTER — Other Ambulatory Visit: Payer: Medicare Other

## 2015-04-13 ENCOUNTER — Emergency Department (HOSPITAL_COMMUNITY): Payer: PPO

## 2015-04-13 ENCOUNTER — Emergency Department (HOSPITAL_COMMUNITY)
Admission: EM | Admit: 2015-04-13 | Discharge: 2015-04-13 | Disposition: A | Discharge status: Home / Self Care | Payer: PPO | Attending: Emergency Medicine | Admitting: Emergency Medicine

## 2015-04-13 DIAGNOSIS — N189 Chronic kidney disease, unspecified: Secondary | ICD-10-CM | POA: Insufficient documentation

## 2015-04-13 DIAGNOSIS — Z8619 Personal history of other infectious and parasitic diseases: Secondary | ICD-10-CM | POA: Insufficient documentation

## 2015-04-13 DIAGNOSIS — I129 Hypertensive chronic kidney disease with stage 1 through stage 4 chronic kidney disease, or unspecified chronic kidney disease: Secondary | ICD-10-CM | POA: Diagnosis not present

## 2015-04-13 DIAGNOSIS — K5790 Diverticulosis of intestine, part unspecified, without perforation or abscess without bleeding: Secondary | ICD-10-CM

## 2015-04-13 DIAGNOSIS — Z880 Allergy status to penicillin: Secondary | ICD-10-CM | POA: Insufficient documentation

## 2015-04-13 DIAGNOSIS — M1990 Unspecified osteoarthritis, unspecified site: Secondary | ICD-10-CM | POA: Diagnosis not present

## 2015-04-13 DIAGNOSIS — Z87442 Personal history of urinary calculi: Secondary | ICD-10-CM | POA: Insufficient documentation

## 2015-04-13 DIAGNOSIS — K219 Gastro-esophageal reflux disease without esophagitis: Secondary | ICD-10-CM | POA: Insufficient documentation

## 2015-04-13 DIAGNOSIS — H269 Unspecified cataract: Secondary | ICD-10-CM | POA: Diagnosis not present

## 2015-04-13 DIAGNOSIS — Z90710 Acquired absence of both cervix and uterus: Secondary | ICD-10-CM | POA: Insufficient documentation

## 2015-04-13 DIAGNOSIS — R1032 Left lower quadrant pain: Secondary | ICD-10-CM

## 2015-04-13 DIAGNOSIS — Z792 Long term (current) use of antibiotics: Secondary | ICD-10-CM | POA: Diagnosis not present

## 2015-04-13 DIAGNOSIS — J439 Emphysema, unspecified: Secondary | ICD-10-CM | POA: Insufficient documentation

## 2015-04-13 DIAGNOSIS — Z79899 Other long term (current) drug therapy: Secondary | ICD-10-CM | POA: Diagnosis not present

## 2015-04-13 DIAGNOSIS — Z91040 Latex allergy status: Secondary | ICD-10-CM | POA: Insufficient documentation

## 2015-04-13 DIAGNOSIS — F419 Anxiety disorder, unspecified: Secondary | ICD-10-CM | POA: Diagnosis not present

## 2015-04-13 DIAGNOSIS — R110 Nausea: Secondary | ICD-10-CM

## 2015-04-13 DIAGNOSIS — R51 Headache: Secondary | ICD-10-CM | POA: Diagnosis not present

## 2015-04-13 DIAGNOSIS — E785 Hyperlipidemia, unspecified: Secondary | ICD-10-CM | POA: Insufficient documentation

## 2015-04-13 DIAGNOSIS — R1084 Generalized abdominal pain: Secondary | ICD-10-CM | POA: Diagnosis present

## 2015-04-13 HISTORY — DX: Calculus of kidney: N20.0

## 2015-04-13 LAB — URINALYSIS, ROUTINE W REFLEX MICROSCOPIC
BILIRUBIN URINE: NEGATIVE
Glucose, UA: NEGATIVE mg/dL
HGB URINE DIPSTICK: NEGATIVE
KETONES UR: NEGATIVE mg/dL
LEUKOCYTES UA: NEGATIVE
NITRITE: NEGATIVE
PROTEIN: NEGATIVE mg/dL
SPECIFIC GRAVITY, URINE: 1.009 (ref 1.005–1.030)
UROBILINOGEN UA: 0.2 mg/dL (ref 0.0–1.0)
pH: 6 (ref 5.0–8.0)

## 2015-04-13 LAB — COMPREHENSIVE METABOLIC PANEL
ALT: 29 U/L (ref 14–54)
ANION GAP: 8 (ref 5–15)
AST: 41 U/L (ref 15–41)
Albumin: 4.7 g/dL (ref 3.5–5.0)
Alkaline Phosphatase: 68 U/L (ref 38–126)
BILIRUBIN TOTAL: 0.5 mg/dL (ref 0.3–1.2)
BUN: 13 mg/dL (ref 6–20)
CHLORIDE: 94 mmol/L — AB (ref 101–111)
CO2: 28 mmol/L (ref 22–32)
Calcium: 9.3 mg/dL (ref 8.9–10.3)
Creatinine, Ser: 0.72 mg/dL (ref 0.44–1.00)
GFR calc Af Amer: >60 mL/min (ref >60)
Glucose, Bld: 106 mg/dL — ABNORMAL HIGH (ref 65–99)
POTASSIUM: 4.7 mmol/L (ref 3.5–5.1)
Sodium: 130 mmol/L — ABNORMAL LOW (ref 135–145)
TOTAL PROTEIN: 7.8 g/dL (ref 6.5–8.1)

## 2015-04-13 LAB — CBC
HEMATOCRIT: 37.1 % (ref 36.0–46.0)
HEMOGLOBIN: 12.5 g/dL (ref 12.0–15.0)
MCH: 31.6 pg (ref 26.0–34.0)
MCHC: 33.7 g/dL (ref 30.0–36.0)
MCV: 93.7 fL (ref 78.0–100.0)
Platelets: 210 10*3/uL (ref 150–400)
RBC: 3.96 MIL/uL (ref 3.87–5.11)
RDW: 12.4 % (ref 11.5–15.5)
WBC: 6.9 10*3/uL (ref 4.0–10.5)

## 2015-04-13 LAB — LIPASE, BLOOD: LIPASE: 34 U/L (ref 22–51)

## 2015-04-13 MED ORDER — IOHEXOL 300 MG/ML  SOLN
100.0000 mL | Freq: Once | INTRAMUSCULAR | Status: AC | PRN
  Administered 2015-04-13: 100 mL via INTRAVENOUS

## 2015-04-13 MED ORDER — IOHEXOL 300 MG/ML  SOLN
25.0000 mL | Freq: Once | INTRAMUSCULAR | Status: AC | PRN
  Administered 2015-04-13: 25 mL via ORAL

## 2015-04-13 MED ORDER — SODIUM CHLORIDE 0.9 % IV BOLUS (SEPSIS)
500.0000 mL | Freq: Once | INTRAVENOUS | Status: AC
  Administered 2015-04-13: 500 mL via INTRAVENOUS

## 2015-04-13 NOTE — ED Notes (Signed)
Patient has had generalized abdominal pain x 6 days. Patient did see her PCP and was told to come in the pain worsened. Patient states that she has a history of diverticulitis. Patient also c/o headache. Patient states she hit the back of head 4 days ago and states she fell and hit her head. Patient denies any dizziness at this time.

## 2015-04-13 NOTE — ED Provider Notes (Signed)
CSN: [id number]     Arrival date & time 04/13/15  1455 History   First MD Initiated Contact with Patient 04/13/15 1632     Chief Complaint  Patient presents with  . Abdominal Pain  . Nausea   Debra White is a 81 y.o. female with a history of fibromyalgia, headaches, diverticulosis, hypertension and GERD who presents to the emergency department complaining of generalized abdominal pain ongoing for the past week. Patient reports she began having generalized abdominal pain 1 week ago. She reports that her primary care provider who advised her to come to the emergency department if her pain worsened or persisted. She currently complains of 4 out of 10 generalized abdominal pain which is worse in her lower abdomen. She also reports having nausea but no vomiting. She reports she's had 4 formed stools today. The patient also reports she fell 4 days ago hitting the back of her head on the screen of a screen door. She currently complains of a 5 out of 10 posterior headache. She reports she does not remember exactly why she fell but believes she may have been dizzy. She reports she is not felt dizzy or lightheaded since the incident 4 days ago. She denies LOC. The patient reports she has taken Zofran and a pain medicine pill prior to arrival and does not wish for nausea or pain medicine currently. She denies current nausea. She reports she is eating chicken soup today without vomiting. The patient denies fevers, chills, numbness, tingling, weakness, chest pain, shortness of breath, double vision, neck pain, new back pain, vomiting, diarrhea, hematochezia, hematuria or dysuria. She reports that her pain does not feel as bad as it did 1 year ago with her diverticulitis.  (Consider location/radiation/quality/duration/timing/severity/associated sxs/prior Treatment) HPI  Past Medical History  Diagnosis Date  . Fibromyalgia   . Anxiety   . Dysrhythmia     RX  . Blood transfusion   . Chronic kidney disease      STONES  . Headache(784.0)   . Arthritis   . Cataract   . Diverticulosis of colon (without mention of hemorrhage) 2007    Colonoscopy  . Candida esophagitis ([place]) 2013    EGD  . Family history of colon cancer     sister  . Hypertension   . Hyperlipemia   . GERD (gastroesophageal reflux disease)   . Emphysema of lung ([place])   . Kidney stones    Past Surgical History  Procedure Laterality Date  . Abdominal hysterectomy    . Hand surgery      BIL   . Shoulder arthroscopy w/ rotator cuff repair      LFT  . Cervical disc surgery    . Back surgery      X2  . Orif tripod fracture  07/13/2011    Procedure: OPEN REDUCTION INTERNAL FIXATION (ORIF) TRIPOD FRACTURE;  Surgeon: Tyson Alias, MD;  Location: [facility];  Service: ENT;  Laterality: Right;  ORIF RIGHT ZYGOMA, ORBITAL FLOOR EXPLORATION WITH FROST STITCH (TEMPORARY TARSORRHAPHY)  . Colonoscopy    . Facial fracture surgery    . Back surgery     Family History  Problem Relation Age of Onset  . Heart disease Mother   . Breast cancer Sister   . Colon cancer Sister   . Cancer Sister     breast  . Cancer Sister     colon  . Cancer Sister     colon  . Heart disease Father   .  Hypertension Daughter   . Hyperlipidemia Daughter    Social History  Substance Use Topics  . Smoking status: Never Smoker   . Smokeless tobacco: Never Used  . Alcohol Use: No   OB History    No data available     Review of Systems  Constitutional: Negative for fever, chills and appetite change.  HENT: Negative for congestion and sore throat.   Eyes: Negative for visual disturbance.  Respiratory: Negative for cough, shortness of breath and wheezing.   Cardiovascular: Negative for chest pain and palpitations.  Gastrointestinal: Positive for nausea and abdominal pain. Negative for vomiting, diarrhea and blood in stool.  Genitourinary: Positive for frequency. Negative for dysuria, urgency, hematuria and difficulty urinating.  Musculoskeletal:  Negative for back pain and neck pain.  Skin: Negative for rash.  Neurological: Positive for headaches. Negative for syncope, weakness, light-headedness and numbness.      Allergies  Latex; Amoxicillin; Chlorhexidine gluconate; Aspirin; Barbiturates; Celebrex; Codeine; Evista; Lipitor; Morphine and related; Nsaids; [place]; Paraffin; Prochlorperazine; Proloprim; Vibra-tab; Vioxx; Betadine; and Caffeine  Home Medications   Prior to Admission medications   Medication Sig Start Date End Date Taking? Authorizing Provider  acetaminophen (TYLENOL ARTHRITIS PAIN) 650 MG CR tablet Take 650 mg by mouth every 8 (eight) hours as needed for pain.   Yes Historical Provider, MD  atenolol (TENORMIN) 100 MG tablet TAKE 1 TABLET BY MOUTH EVERY MORNING Patient taking differently: TAKE 0.5 TABLET BY MOUTH EVERY MORNING 12/21/14  Yes Unk Pinto, MD  azelastine (ASTELIN) 0.1 % nasal spray Use 1 to 2 sprays each nostril 2 x day - morning & night for nasal drainage & congestion 07/26/14 07/27/15 Yes Unk Pinto, MD  cholecalciferol (VITAMIN D) 1000 UNITS tablet Take 2,000 Units by mouth daily.   Yes Historical Provider, MD  clonazePAM (KLONOPIN) 1 MG tablet TAKE 1/2-1 TABLET BY MOUTH UP TO 3 TIMESDAILY IF NEEDED FOR NERVES Patient taking differently: Take 0.5-1 mg by mouth 3 (three) times daily. Takes half a tablet in the morning and mid-day then takes one full tablet at night. 11/30/14  Yes Unk Pinto, MD  hyoscyamine (LEVSIN SL) 0.125 MG SL tablet Dissolve one tablet by mouth every 4-6 hours as needed Patient taking differently: Take 0.125 mg by mouth every 4 (four) hours as needed for cramping.  07/05/14  Yes Vicie Mutters, PA-C  levofloxacin (LEVAQUIN) 500 MG tablet Take 1 tablet (500 mg total) by mouth daily. 04/08/15  Yes Vicie Mutters, PA-C  losartan (COZAAR) 100 MG tablet Take 0.5 tablets (50 mg total) by mouth daily. 04/21/14  Yes Unk Pinto, MD  pravastatin (PRAVACHOL) 40 MG tablet Take 1  tablet (40 mg total) by mouth daily. Patient taking differently: Take 20 mg by mouth daily.  09/02/14  Yes Unk Pinto, MD  Probiotic Product (FLORAJEN3 PO) Take 1 tablet by mouth daily.    Yes Historical Provider, MD  ranitidine (ZANTAC) 300 MG tablet Take 1 tablet 1 to 2 x day as needed for acid indigestion & redlux 10/04/14 10/04/15 Yes Unk Pinto, MD  traMADol (ULTRAM) 50 MG tablet Take 50 mg by mouth every 6 (six) hours as needed for moderate pain.  04/08/15  Yes Vicie Mutters, PA-C  metroNIDAZOLE (FLAGYL) 500 MG tablet Take 1 tablet (500 mg total) by mouth 3 (three) times daily. 04/08/15 04/15/15  Vicie Mutters, PA-C   BP 167/67 mmHg  Pulse 62  Temp(Src) 97.7 F (36.5 C) (Oral)  Resp 16  SpO2 100% Physical Exam  Constitutional: She is oriented  to person, place, and time. She appears well-developed and well-nourished. No distress.  Nontoxic appearing.  HENT:  Head: Normocephalic and atraumatic.  Mouth/Throat: Oropharynx is clear and moist.  Eyes: Conjunctivae and EOM are normal. Pupils are equal, round, and reactive to light. Right eye exhibits no discharge. Left eye exhibits no discharge.  Neck: Normal range of motion. Neck supple.  Cardiovascular: Normal rate, regular rhythm, normal heart sounds and intact distal pulses.  Exam reveals no gallop and no friction rub.   Pulmonary/Chest: Effort normal and breath sounds normal. No respiratory distress. She has no wheezes. She has no rales.  Abdominal: Soft. Bowel sounds are normal. She exhibits no distension and no mass. There is tenderness. There is no rebound and no guarding.  Abdomen is soft. Bowel sounds are present. Patient has left lower quadrant abdominal tenderness to palpation. No peritoneal signs.  Musculoskeletal: Normal range of motion. She exhibits no edema.  Lymphadenopathy:    She has no cervical adenopathy.  Neurological: She is alert and oriented to person, place, and time. No cranial nerve deficit. Coordination  normal.  The patient is alert and oriented 3. Cranial nerves are intact. Sensation intact bilateral upper and lower extremities.  Skin: Skin is warm and dry. No rash noted. She is not diaphoretic. No erythema. No pallor.  Psychiatric: She has a normal mood and affect. Her behavior is normal.  Nursing note and vitals reviewed.   ED Course  Procedures (including critical care time) Labs Review Labs Reviewed  COMPREHENSIVE METABOLIC PANEL - Abnormal; Notable for the following:    Sodium 130 (*)    Chloride 94 (*)    Glucose, Bld 106 (*)    All other components within normal limits  LIPASE, BLOOD  CBC  URINALYSIS, ROUTINE W REFLEX MICROSCOPIC (NOT AT [facility])    Imaging Review Ct Abdomen Pelvis W Contrast  04/13/2015  CLINICAL DATA:  Generalized abdominal pain for 1 week, previous history of diverticulitis EXAM: CT ABDOMEN AND PELVIS WITH CONTRAST TECHNIQUE: Multidetector CT imaging of the abdomen and pelvis was performed using the standard protocol following bolus administration of intravenous contrast. CONTRAST:  24mL OMNIPAQUE IOHEXOL 300 MG/ML SOLN, 161mL OMNIPAQUE IOHEXOL 300 MG/ML SOLN COMPARISON:  None. FINDINGS: Lung bases are free of acute infiltrate or sizable effusion. The liver, gallbladder, spleen, adrenal glands and pancreas are within normal limits. Kidneys are well visualized bilaterally. No renal calculi or obstructive changes are noted. A left circumaortic renal vein is noted. Aortoiliac calcifications are noted without aneurysmal dilatation. Diffuse diverticular changes noted. No findings to suggest diverticulitis are seen at this time. The appendix is not well visualized although no inflammatory changes are noted. The bladder is well distended. No pelvic mass lesion is seen. Postsurgical changes in the lumbar spine are seen. No acute bony abnormality is noted. IMPRESSION: Diverticulosis without diverticulitis. No acute abnormality noted. Electronically Signed   By: Inez Catalina  M.D.   On: 04/13/2015 18:21   I have personally reviewed and evaluated these images and lab results as part of my medical decision-making.   EKG Interpretation None      Filed Vitals:   04/13/15 1513 04/13/15 1932  BP: 142/74 167/67  Pulse: 66 62  Temp: 97.7 F (36.5 C)   TempSrc: Oral   Resp: 16 16  SpO2: 100% 100%     MDM   Meds given in ED:  Medications  sodium chloride 0.9 % bolus 500 mL (0 mLs Intravenous Stopped 04/13/15 1817)  iohexol (OMNIPAQUE) 300 MG/ML  solution 25 mL (25 mLs Oral Contrast Given 04/13/15 1710)  iohexol (OMNIPAQUE) 300 MG/ML solution 100 mL (100 mLs Intravenous Contrast Given 04/13/15 1802)    Discharge Medication List as of 04/13/2015  7:02 PM      Final diagnoses:  Diverticulosis of intestine without bleeding, unspecified intestinal tract location  Nausea  Left lower quadrant pain   This is a 81 y.o. female with a history of fibromyalgia, headaches, diverticulosis, hypertension and GERD who presents to the emergency department complaining of generalized abdominal pain ongoing for the past week. Patient reports she began having generalized abdominal pain 1 week ago. She reports that her primary care provider who advised her to come to the emergency department if her pain worsened or persisted. She currently complains of 4 out of 10 generalized abdominal pain which is worse in her lower abdomen. She also reports having nausea but no vomiting. She reports she's had 4 formed stools today. She reports she has tolerated food and water today without vomiting. Plan the patient is afebrile and nontoxic appearing. Her abdomen is soft and she has left lower quadrant abdominal tenderness to palpation. No peritoneal signs. Urinalysis is within normal limits and no sign of infection. Lipase is 34. CMP indicates a sodium of 1:30 and is otherwise unremarkable. CBCs within normal limits. No leukocytosis. CT abdomen pelvis with contrast indicates diverticulosis without  diverticulitis. No acute abnormality noted. Had reevaluation the patient reports her abdominal pain has resolved. She is tolerated water by mouth without vomiting or nausea. She reports she has plenty of Zofran at home and does not require any prescriptions for discharge. I educated on diet. I advised strict return precautions. I advised the patient to follow-up with their primary care provider this week. I advised the patient to return to the emergency department with new or worsening symptoms or new concerns. The patient verbalized understanding and agreement with plan.    This patient was discussed with and evaluated by Dr. Alfonse Spruce who agrees with assessment and plan.    Waynetta Pean, PA-C 04/13/15 2238  Harvel Quale, MD 04/14/15 [phone]

## 2015-04-13 NOTE — Discharge Instructions (Signed)
Diverticulosis Diverticulosis is the condition that develops when small pouches (diverticula) form in the wall of your colon. Your colon, or large intestine, is where water is absorbed and stool is formed. The pouches form when the inside layer of your colon pushes through weak spots in the outer layers of your colon. CAUSES  No one knows exactly what causes diverticulosis. RISK FACTORS  Being older than 93. Your risk for this condition increases with age. Diverticulosis is rare in people younger than 40 years. By age 62, almost everyone has it.  Eating a low-fiber diet.  Being frequently constipated.  Being overweight.  Not getting enough exercise.  Smoking.  Taking over-the-counter pain medicines, like aspirin and ibuprofen. SYMPTOMS  Most people with diverticulosis do not have symptoms. DIAGNOSIS  Because diverticulosis often has no symptoms, health care providers often discover the condition during an exam for other colon problems. In many cases, a health care provider will diagnose diverticulosis while using a flexible scope to examine the colon (colonoscopy). TREATMENT  If you have never developed an infection related to diverticulosis, you may not need treatment. If you have had an infection before, treatment may include:  Eating more fruits, vegetables, and grains.  Taking a fiber supplement.  Taking a live bacteria supplement (probiotic).  Taking medicine to relax your colon. HOME CARE INSTRUCTIONS   Drink at least 6-8 glasses of water each day to prevent constipation.  Try not to strain when you have a bowel movement.  Keep all follow-up appointments. If you have had an infection before:  Increase the fiber in your diet as directed by your health care provider or dietitian.  Take a dietary fiber supplement if your health care provider approves.  Only take medicines as directed by your health care provider. SEEK MEDICAL CARE IF:   You have abdominal  pain.  You have bloating.  You have cramps.  You have not gone to the bathroom in 3 days. SEEK IMMEDIATE MEDICAL CARE IF:   Your pain gets worse.  Yourbloating becomes very bad.  You have a fever or chills, and your symptoms suddenly get worse.  You begin vomiting.  You have bowel movements that are bloody or black. MAKE SURE YOU:  Understand these instructions.  Will watch your condition.  Will get help right away if you are not doing well or get worse.   This information is not intended to replace advice given to you by your health care provider. Make sure you discuss any questions you have with your health care provider.   Document Released: 03/08/2004 Document Revised: 06/16/2013 Document Reviewed: 05/06/2013 Elsevier Interactive Patient Education 2016 Else[employer] Nausea and Vomiting Nausea is a sick feeling that often comes before throwing up (vomiting). Vomiting is a reflex where stomach contents come out of your mouth. Vomiting can cause severe loss of body fluids (dehydration). Children and elderly adults can become dehydrated quickly, especially if they also have diarrhea. Nausea and vomiting are symptoms of a condition or disease. It is important to find the cause of your symptoms. CAUSES   Direct irritation of the stomach lining. This irritation can result from increased acid production (gastroesophageal reflux disease), infection, food poisoning, taking certain medicines (such as nonsteroidal anti-inflammatory drugs), alcohol use, or tobacco use.  Signals from the brain.These signals could be caused by a headache, heat exposure, an inner ear disturbance, increased pressure in the brain from injury, infection, a tumor, or a concussion, pain, emotional stimulus, or metabolic problems.  An obstruction  in the gastrointestinal tract (bowel obstruction).  Illnesses such as diabetes, hepatitis, gallbladder problems, appendicitis, kidney problems, cancer, sepsis,  atypical symptoms of a heart attack, or eating disorders.  Medical treatments such as chemotherapy and radiation.  Receiving medicine that makes you sleep (general anesthetic) during surgery. DIAGNOSIS Your caregiver may ask for tests to be done if the problems do not improve after a few days. Tests may also be done if symptoms are severe or if the reason for the nausea and vomiting is not clear. Tests may include:  Urine tests.  Blood tests.  Stool tests.  Cultures (to look for evidence of infection).  X-rays or other imaging studies. Test results can help your caregiver make decisions about treatment or the need for additional tests. TREATMENT You need to stay well hydrated. Drink frequently but in small amounts.You may wish to drink water, sports drinks, clear broth, or eat frozen ice pops or gelatin dessert to help stay hydrated.When you eat, eating slowly may help prevent nausea.There are also some antinausea medicines that may help prevent nausea. HOME CARE INSTRUCTIONS   Take all medicine as directed by your caregiver.  If you do not have an appetite, do not force yourself to eat. However, you must continue to drink fluids.  If you have an appetite, eat a normal diet unless your caregiver tells you differently.  Eat a variety of complex carbohydrates (rice, wheat, potatoes, bread), lean meats, yogurt, fruits, and vegetables.  Avoid high-fat foods because they are more difficult to digest.  Drink enough water and fluids to keep your urine clear or pale yellow.  If you are dehydrated, ask your caregiver for specific rehydration instructions. Signs of dehydration may include:  Severe thirst.  Dry lips and mouth.  Dizziness.  Dark urine.  Decreasing urine frequency and amount.  Confusion.  Rapid breathing or pulse. SEEK IMMEDIATE MEDICAL CARE IF:   You have blood or brown flecks (like coffee grounds) in your vomit.  You have black or bloody stools.  You  have a severe headache or stiff neck.  You are confused.  You have severe abdominal pain.  You have chest pain or trouble breathing.  You do not urinate at least once every 8 hours.  You develop cold or clammy skin.  You continue to vomit for longer than 24 to 48 hours.  You have a fever. MAKE SURE YOU:   Understand these instructions.  Will watch your condition.  Will get help right away if you are not doing well or get worse.   This information is not intended to replace advice given to you by your health care provider. Make sure you discuss any questions you have with your health care provider.   Document Released: 06/11/2005 Document Revised: 09/03/2011 Document Reviewed: 11/08/2010 Elsevier Interactive Patient Education [employer].

## 2015-04-13 NOTE — ED Notes (Signed)
Off floor for testing 

## 2015-04-14 ENCOUNTER — Ambulatory Visit (INDEPENDENT_AMBULATORY_CARE_PROVIDER_SITE_OTHER): Payer: PPO | Admitting: Physician Assistant

## 2015-04-14 ENCOUNTER — Encounter: Payer: Self-pay | Admitting: Physician Assistant

## 2015-04-14 VITALS — BP 130/70 | HR 73 | Temp 97.9°F | Resp 14 | Ht 64.5 in | Wt 106.0 lb

## 2015-04-14 DIAGNOSIS — R197 Diarrhea, unspecified: Secondary | ICD-10-CM | POA: Diagnosis not present

## 2015-04-14 DIAGNOSIS — Z8719 Personal history of other diseases of the digestive system: Secondary | ICD-10-CM

## 2015-04-14 DIAGNOSIS — R1032 Left lower quadrant pain: Secondary | ICD-10-CM | POA: Diagnosis not present

## 2015-04-14 DIAGNOSIS — K219 Gastro-esophageal reflux disease without esophagitis: Secondary | ICD-10-CM | POA: Diagnosis not present

## 2015-04-14 MED ORDER — HYOSCYAMINE SULFATE 0.125 MG SL SUBL: SUBLINGUAL_TABLET | SUBLINGUAL | Status: DC

## 2015-04-14 NOTE — Patient Instructions (Addendum)
Take the levsin or hyoscyamine once EVERY night  Will refer to Dr. Oletta Lamas.   Food Choices for Gastroesophageal Reflux Disease, Adult When you have gastroesophageal reflux disease (GERD), the foods you eat and your eating habits are very important. Choosing the right foods can help ease the discomfort of GERD. WHAT GENERAL GUIDELINES DO I NEED TO FOLLOW?  Choose fruits, vegetables, whole grains, low-fat dairy products, and low-fat meat, fish, and poultry.  Limit fats such as oils, salad dressings, butter, nuts, and avocado.  Keep a food diary to identify foods that cause symptoms.  Avoid foods that cause reflux. These may be different for different people.  Eat frequent small meals instead of three large meals each day.  Eat your meals slowly, in a relaxed setting.  Limit fried foods.  Cook foods using methods other than frying.  Avoid drinking alcohol.  Avoid drinking large amounts of liquids with your meals.  Avoid bending over or lying down until 2-3 hours after eating. WHAT FOODS ARE NOT RECOMMENDED? The following are some foods and drinks that may worsen your symptoms: Vegetables Tomatoes. Tomato juice. Tomato and spaghetti sauce. Chili peppers. Onion and garlic. Horseradish. Fruits Oranges, grapefruit, and lemon (fruit and juice). Meats High-fat meats, fish, and poultry. This includes hot dogs, ribs, ham, sausage, salami, and bacon. Dairy Whole milk and chocolate milk. Sour cream. Cream. Butter. Ice cream. Cream cheese.  Beverages Coffee and tea, with or without caffeine. Carbonated beverages or energy drinks. Condiments Hot sauce. Barbecue sauce.  Sweets/Desserts Chocolate and cocoa. Donuts. Peppermint and spearmint. Fats and Oils High-fat foods, including [place] fries and potato chips. Other Vinegar. Strong spices, such as black pepper, white pepper, red pepper, cayenne, curry powder, cloves, ginger, and chili powder. The items listed above may not be a  complete list of foods and beverages to avoid. Contact your dietitian for more information.   This information is not intended to replace advice given to you by your health care provider. Make sure you discuss any questions you have with your health care provider.   Document Released: 06/11/2005 Document Revised: 07/02/2014 Document Reviewed: 04/15/2013 Elsevier Interactive Patient Education [employer].

## 2015-04-14 NOTE — Progress Notes (Signed)
Assessment and Plan: IBS with diarrhea versus infection/malabsorption-  Normal CT scan AB, get on levsin daily, continue klonopin, continue zantac, declines PPI Will refer to GI States not water but is formed stool so will not get stool sample at this time.   Future Appointments Date Time Provider [place]  06/13/2015 10:30 AM Unk Pinto, MD [facility] None  09/20/2015 10:00 AM Unk Pinto, MD [facility] None     HPI 81 y.o.female WF with history of diverticulitis with abscess in 2014 presents for follow up ER visit. She was seen 04/08/2015 for AB pain. She was treated with levaquin/flagyl and set up for a CT scan, she started to have worsening pain so went to the ER yesterday. There she had a normal CT AB with contrast and normal labs. She states she continues to have lower AB pain, levsin does help. She does have burning in her stomach and throat. Would like to see Dr. Oletta Lamas.    CT AB PELVIS W CONTRAST  FINDINGS: Lung bases are free of acute infiltrate or sizable effusion.  The liver, gallbladder, spleen, adrenal glands and pancreas are within normal limits. Kidneys are well visualized bilaterally. No renal calculi or obstructive changes are noted. A left circumaortic renal vein is noted.  Aortoiliac calcifications are noted without aneurysmal dilatation. Diffuse diverticular changes noted. No findings to suggest diverticulitis are seen at this time. The appendix is not well visualized although no inflammatory changes are noted. The bladder is well distended. No pelvic mass lesion is seen. Postsurgical changes in the lumbar spine are seen. No acute bony abnormality is noted.  IMPRESSION: Diverticulosis without diverticulitis.  No acute abnormality noted.   Past Medical History  Diagnosis Date  . Fibromyalgia   . Anxiety   . Dysrhythmia     RX  . Blood transfusion   . Chronic kidney disease     STONES  . Headache(784.0)   . Arthritis   .  Cataract   . Diverticulosis of colon (without mention of hemorrhage) 2007    Colonoscopy  . Candida esophagitis ([place]) 2013    EGD  . Family history of colon cancer     sister  . Hypertension   . Hyperlipemia   . GERD (gastroesophageal reflux disease)   . Emphysema of lung ([place])   . Kidney stones      Allergies  Allergen Reactions  . Latex Hives    TONGUE HAD BLISTERS WHEN HAD DENTAL SURGERY  . Amoxicillin Nausea And Vomiting  . Chlorhexidine Gluconate Itching  . Aspirin     Gi upset  . Barbiturates   . Celebrex [Celecoxib]   . Codeine Itching  . Evista [Raloxifene]   . Lipitor [Atorvastatin]   . Morphine And Related   . Nsaids   . Oruvail [Ketoprofen]   . Paraffin   . Prochlorperazine Other (See Comments)    Uncontrolled shaking  . Proloprim [Trimethoprim]   . Vibra-Tab [Doxycycline]   . Vioxx [Rofecoxib]     edema  . Betadine [Povidone Iodine] Rash  . Caffeine Palpitations      Current Outpatient Prescriptions on File Prior to Visit  Medication Sig Dispense Refill  . acetaminophen (TYLENOL ARTHRITIS PAIN) 650 MG CR tablet Take 650 mg by mouth every 8 (eight) hours as needed for pain.    Marland Kitchen atenolol (TENORMIN) 100 MG tablet TAKE 1 TABLET BY MOUTH EVERY MORNING (Patient taking differently: TAKE 0.5 TABLET BY MOUTH EVERY MORNING) 90 tablet 3  . azelastine (ASTELIN) 0.1 % nasal  spray Use 1 to 2 sprays each nostril 2 x day - morning & night for nasal drainage & congestion 30 mL 99  . cholecalciferol (VITAMIN D) 1000 UNITS tablet Take 2,000 Units by mouth daily.    . clonazePAM (KLONOPIN) 1 MG tablet TAKE 1/2-1 TABLET BY MOUTH UP TO 3 TIMESDAILY IF NEEDED FOR NERVES (Patient taking differently: Take 0.5-1 mg by mouth 3 (three) times daily. Takes half a tablet in the morning and mid-day then takes one full tablet at night.) 90 tablet 5  . hyoscyamine (LEVSIN SL) 0.125 MG SL tablet Dissolve one tablet by mouth every 4-6 hours as needed (Patient taking differently: Take 0.125  mg by mouth every 4 (four) hours as needed for cramping. ) 30 tablet 3  . levofloxacin (LEVAQUIN) 500 MG tablet Take 1 tablet (500 mg total) by mouth daily. 10 tablet 0  . losartan (COZAAR) 100 MG tablet Take 0.5 tablets (50 mg total) by mouth daily. 90 tablet 3  . pravastatin (PRAVACHOL) 40 MG tablet Take 1 tablet (40 mg total) by mouth daily. (Patient taking differently: Take 20 mg by mouth daily. ) 90 tablet 4  . Probiotic Product (FLORAJEN3 PO) Take 1 tablet by mouth daily.     . ranitidine (ZANTAC) 300 MG tablet Take 1 tablet 1 to 2 x day as needed for acid indigestion & redlux 180 tablet 99  . traMADol (ULTRAM) 50 MG tablet Take 50 mg by mouth every 6 (six) hours as needed for moderate pain.  90 tablet 0  . [DISCONTINUED] omeprazole (PRILOSEC OTC) 20 MG tablet Take 20 mg by mouth daily.     No current facility-administered medications on file prior to visit.    ROS: all negative except above.   Physical Exam: Filed Weights   04/14/15 1103  Weight: 106 lb (48.081 kg)   BP 130/70 mmHg  Pulse 73  Temp(Src) 97.9 F (36.6 C) (Temporal)  Resp 14  Ht 5' 4.5" (1.638 m)  Wt 106 lb (48.081 kg)  BMI 17.92 kg/m2  SpO2 90% General Appearance: Well nourished, in no apparent distress. Eyes: PERRLA, EOMs, conjunctiva no swelling or erythema Sinuses: No Frontal/maxillary tenderness ENT/Mouth: Ext aud canals clear, TMs without erythema, bulging. No erythema, swelling, or exudate on post pharynx.  Tonsils not swollen or erythematous. Hearing normal.  Neck: Supple, thyroid normal.  Respiratory: Respiratory effort normal, BS equal bilaterally without rales, rhonchi, wheezing or stridor.  Cardio: RRR with no MRGs. Brisk peripheral pulses without edema.  Abdomen: Soft, + BS, + epigastric and LLQ tenderness, no guarding, rebound, hernias, masses. Lymphatics: Non tender without lymphadenopathy.  Musculoskeletal: Full ROM, 5/5 strength, normal gait.  Skin: Warm, dry without rashes, lesions,  ecchymosis.  Neuro: Cranial nerves intact. Normal muscle tone, no cerebellar symptoms. Sensation intact.  Psych: Awake and oriented X 3, normal affect, Insight and Judgment appropriate.     Vicie Mutters, PA-C 11:14 AM [facility]

## 2015-04-15 ENCOUNTER — Other Ambulatory Visit: Payer: PPO

## 2015-04-15 ENCOUNTER — Other Ambulatory Visit: Payer: Self-pay | Admitting: Internal Medicine

## 2015-04-18 ENCOUNTER — Ambulatory Visit (INDEPENDENT_AMBULATORY_CARE_PROVIDER_SITE_OTHER): Payer: PPO | Admitting: Gastroenterology

## 2015-04-18 ENCOUNTER — Encounter: Payer: Self-pay | Admitting: Gastroenterology

## 2015-04-18 VITALS — BP 118/60 | HR 72 | Ht 64.5 in | Wt 105.2 lb

## 2015-04-18 DIAGNOSIS — K219 Gastro-esophageal reflux disease without esophagitis: Secondary | ICD-10-CM | POA: Diagnosis not present

## 2015-04-18 DIAGNOSIS — K589 Irritable bowel syndrome without diarrhea: Secondary | ICD-10-CM

## 2015-04-18 NOTE — Progress Notes (Signed)
    History of Present Illness: This is a 81 year old female returning for follow-up of left lower quadrant abdominal pain that frequently generalizes associated with loose stools and occasional nausea. She was recently evaluated in the ED. CT scan showed diverticulosis without diverticulitis. Blood work was unremarkable except for a sodium=130 and a low chloride=94. She is had follow-up with her PCP. Her symptoms responded well to Levsin however she uses it infrequently. GERD well controlled on ranitidine.   Current Medications, Allergies, Past Medical History, Past Surgical History, Family History and Social History were reviewed in [employer] record.  Physical Exam: General: Well developed, well nourished, no acute distress Head: Normocephalic and atraumatic Eyes:  sclerae anicteric, EOMI Ears: Normal auditory acuity Mouth: No deformity or lesions Lungs: Clear throughout to auscultation Heart: Regular rate and rhythm; no murmurs, rubs or bruits Abdomen: Soft, non tender and non distended. No masses, hepatosplenomegaly or hernias noted. Normal Bowel sounds Musculoskeletal: Symmetrical with no gross deformities  Pulses:  Normal pulses noted Extremities: No clubbing, cyanosis, edema or deformities noted Neurological: Alert oriented x 4, grossly nonfocal Psychological:  Alert and cooperative. Normal mood and affect  Assessment and Recommendations:  1. GERD. Continue ranitidine daily and standard antireflux measures.   2. IBS. Symptoms promptly responded to Levsin although she uses Levsin infrequently. Encouraged the use of Levsin before meals and every 4 hours as needed  3. Diverticulitis complicated by small abscess in 2014. Long-term high fiber diet with adequate daily water intake.  4. Family history of colon cancer in her sister. She underwent colonoscopy in September 2013 which was unremarkable. No plans for future screening colonoscopies as she will be over  age 38 her 5 year interval exam is due.  5. Low-sodium and low chloride. Follow-up with PCP.

## 2015-04-18 NOTE — Patient Instructions (Signed)
Increase your Levsin to one tablet by mouth every 4 hours as needed.   Thank you for choosing me and [facility].  Pricilla Riffle. Dagoberto Ligas., MD., Marval Regal  cc: Vicie Mutters, PA

## 2015-05-07 ENCOUNTER — Other Ambulatory Visit: Payer: Self-pay | Admitting: Internal Medicine

## 2015-05-09 ENCOUNTER — Telehealth: Payer: Self-pay | Admitting: Internal Medicine

## 2015-05-09 NOTE — Telephone Encounter (Signed)
Patient Name: Debra White  DOB: August 01, 1933    Initial Comment Caller states, stomach hurts all last night, and still hurts today, she has diverticulitis, she wants to knowif she can [place] e a heating pad ? , she sees Dr Fuller Plan @ this location    Nurse Assessment  Nurse: Raphael Gibney, RN, Vanita Ingles Date/Time Eilene Ghazi Time): 05/09/2015 12:59:11 PM  Confirm and document reason for call. If symptomatic, describe symptoms. ---Caller states she has had abd pain all night. Has pain on both sides of her abd History of diverticulitis. She has taken a pain pill. Periods of constipation and diarrhea. Wants to know about using a heating pad. Pain has been constant. No fever.  Has the patient traveled out of the country within the last 30 days? ---Not Applicable  Does the patient have any new or worsening symptoms? ---Yes  Will a triage be completed? ---Yes  Related visit to physician within the last 2 weeks? ---Yes  Does the PT have any chronic conditions? (i.e. diabetes, asthma, etc.) ---Yes  List chronic conditions. ---diverticulitis; HTN;     Guidelines    Guideline Title Affirmed Question Affirmed Notes  Abdominal Pain - Female [1] MILD-MODERATE pain AND [2] constant AND [3] present > 2 hours    Final Disposition User   See Physician within 4 Hours (or PCP triage) Raphael Gibney, RN, Vera    Comments  No appt is available this afternoon at the [facility] office. Pt does not want to go to another office and does not want to go to ER or urgent care. She would like a call back.   Referrals  GO TO FACILITY REFUSED   Disagree/Comply: Disagree  Disagree/Comply Reason: Wait and see

## 2015-05-11 ENCOUNTER — Ambulatory Visit (INDEPENDENT_AMBULATORY_CARE_PROVIDER_SITE_OTHER): Payer: PPO | Admitting: Internal Medicine

## 2015-05-11 ENCOUNTER — Encounter: Payer: Self-pay | Admitting: Internal Medicine

## 2015-05-11 VITALS — BP 100/66 | HR 60 | Temp 97.3°F | Resp 16 | Ht 64.5 in | Wt 107.0 lb

## 2015-05-11 DIAGNOSIS — R109 Unspecified abdominal pain: Secondary | ICD-10-CM | POA: Diagnosis not present

## 2015-05-11 DIAGNOSIS — R7303 Prediabetes: Secondary | ICD-10-CM

## 2015-05-11 DIAGNOSIS — E441 Mild protein-calorie malnutrition: Secondary | ICD-10-CM | POA: Insufficient documentation

## 2015-05-11 DIAGNOSIS — Z681 Body mass index (BMI) 19 or less, adult: Secondary | ICD-10-CM | POA: Diagnosis not present

## 2015-05-11 MED ORDER — DICYCLOMINE HCL 20 MG PO TABS: ORAL_TABLET | ORAL | Status: DC

## 2015-05-11 NOTE — Progress Notes (Signed)
Subjective:    Patient ID: Debra White, female    DOB: 01/26/1934, 81 y.o.   MRN: [id number]  HPI  This very nice 81 yo MWF presents with c/o intermittent lower midline suprapubic aching discomfort and dysuria x 10 days. Denied N/V, Abd Distention, D/C, fever/chills. States she had a neg U/A ~ 1 month ago at an ER visit.  Medication Sig  . acetaminophen (TYLENOL ARTHRITIS PAIN) 650 MG CR tablet Take 650 mg by mouth every 8 (eight) hours as needed for pain.  Marland Kitchen atenolol (TENORMIN) 100 MG tablet TAKE 1 TABLET BY MOUTH EVERY MORNING (Patient taking differently: TAKE 0.5 TABLET BY MOUTH EVERY MORNING)  . azelastine (ASTELIN) 0.1 % nasal spray Use 1 to 2 sprays each nostril 2 x day - morning & night for nasal drainage & congestion  . cholecalciferol (VITAMIN D) 1000 UNITS tablet Take 2,000 Units by mouth daily.  . clonazePAM (KLONOPIN) 1 MG tablet TAKE 1/2-1 TABLET BY MOUTH UP TO 3 TIMESDAILY IF NEEDED FOR NERVES (Patient taking differently: Take 0.5-1 mg by mouth 3 (three) times daily. Takes half a tablet in the morning and mid-day then takes one full tablet at night.)  . hyoscyamine (LEVSIN SL) 0.125 MG SL tablet Dissolve one tablet by mouth every 4-6 hours as needed  . losartan (COZAAR) 100 MG tablet Take 0.5 tablets (50 mg total) by mouth daily.  . pravastatin (PRAVACHOL) 40 MG tablet Take 1 tablet (40 mg total) by mouth daily. (Patient taking differently: Take 20 mg by mouth daily. )  . Probiotic Product (FLORAJEN3 PO) Take 1 tablet by mouth daily.   . ranitidine (ZANTAC) 300 MG tablet Take 1 tablet 1 to 2 x day as needed for acid indigestion & redlux  . traMADol (ULTRAM) 50 MG tablet TAKE 1 TABLET BY MOUTH 4 TIMES DAILY AS NEEDED FOR PAIN   No facility-administered medications prior to visit.   Allergies  Allergen Reactions  . Latex Hives    TONGUE HAD BLISTERS WHEN HAD DENTAL SURGERY  . Amoxicillin Nausea And Vomiting  . Chlorhexidine Gluconate Itching  . Aspirin     Gi upset  .  Barbiturates   . Celebrex [Celecoxib]   . Codeine Itching  . Evista [Raloxifene]   . Lipitor [Atorvastatin]   . Morphine And Related   . Nsaids   . Oruvail [Ketoprofen]   . Paraffin   . Prochlorperazine Other (See Comments)    Uncontrolled shaking  . Proloprim [Trimethoprim]   . Vibra-Tab [Doxycycline]   . Vioxx [Rofecoxib]     edema  . Betadine [Povidone Iodine] Rash  . Caffeine Palpitations   Past Medical History  Diagnosis Date  . Fibromyalgia   . Anxiety   . Dysrhythmia     RX  . Blood transfusion   . Chronic kidney disease     STONES  . Headache(784.0)   . Arthritis   . Cataract   . Diverticulosis of colon (without mention of hemorrhage) 2007    Colonoscopy  . Candida esophagitis ([place]) 2013    EGD  . Family history of colon cancer     sister  . Hypertension   . Hyperlipemia   . GERD (gastroesophageal reflux disease)   . Emphysema of lung ([place])   . Kidney stones    Past Surgical History  Procedure Laterality Date  . Abdominal hysterectomy    . Hand surgery      BIL   . Shoulder arthroscopy w/ rotator cuff repair  LFT  . Cervical disc surgery    . Back surgery      X2  . Orif tripod fracture  07/13/2011    Procedure: OPEN REDUCTION INTERNAL FIXATION (ORIF) TRIPOD FRACTURE;  Surgeon: Tyson Alias, MD;  Location: [facility];  Service: ENT;  Laterality: Right;  ORIF RIGHT ZYGOMA, ORBITAL FLOOR EXPLORATION WITH FROST STITCH (TEMPORARY TARSORRHAPHY)  . Colonoscopy    . Facial fracture surgery    . Back surgery     Review of Systems  10 point systems review negative except as above.    Objective:   Physical Exam  BP 100/66 mmHg  Pulse 60  Temp(Src) 97.3 F (36.3 C)  Resp 16  Ht 5' 4.5" (1.638 m)  Wt 107 lb (48.535 kg)  BMI 18.09 kg/m2  HEENT - Eac's patent. TM's Nl. EOM's full. PERRLA. NasoOroPharynx clear. Neck - supple. Nl Thyroid. Carotids 2+ & No bruits, nodes, JVD Chest - Clear equal BS w/o Rales, rhonchi, wheezes. Cor - Nl HS. RRR w/o sig  MGR. PP 1(+). No edema. Abd - soft, flat w/o palpable organomegaly, masses, and only slight supra-pubic tenderness w/o guarding or RB. BS nl. MS- FROM w/o deformities. Muscle power, tone and bulk Nl. Gait Nl. Neuro - No obvious Cr N abnormalities. Sensory, motor and Cerebellar functions appear Nl w/o focal abnormalities. Psyche - Mental status normal & appropriate.  No delusions, ideations or obvious mood abnormalities.    Assessment & Plan:   1. Abdominal pain, unspecified abdominal location ? Interstitial cystitis vs bacterial cystitis  - Urinalysis, Routine w reflex microscopic  - Urine culture - dicyclomine (BENTYL) 20 MG tablet; Take 1 tablet 3 x day before meals for bladder pains  Dispense: 90 tablet; Refill: 2

## 2015-05-11 NOTE — Patient Instructions (Signed)
Interstitial Cystitis Interstitial cystitis is a condition that causes inflammation of the bladder. The bladder is a hollow organ in the lower part of your abdomen. It stores urine after the urine is made by your kidneys. With interstitial cystitis, you may have pain in the bladder area. You may also have a frequent and urgent need to urinate. The severity of interstitial cystitis can vary from person to person. You may have flare-ups of the condition, and then it may go away for a while. For many people who have this condition, it becomes a long-term problem. CAUSES The cause of this condition is not known. RISK FACTORS This condition is more likely to develop in women. SYMPTOMS Symptoms of interstitial cystitis vary, and they can change over time. Symptoms may include:  Discomfort or pain in the bladder area. This can range from mild to severe. The pain may change in intensity as the bladder fills with urine or as it empties.  Pelvic pain.  An urgent need to urinate.  Frequent urination.  Pain during sexual intercourse.  Pinpoint bleeding on the bladder wall. For women, the symptoms often get worse during menstruation. DIAGNOSIS This condition is diagnosed by evaluating your symptoms and ruling out other causes. A physical exam will be done. Various tests may be done to rule out other conditions. Common tests include:  Urine tests.  Cystoscopy. In this test, a tool that is like a very thin telescope is used to look into your bladder.  Biopsy. This involves taking a sample of tissue from the bladder wall to be examined under a microscope. TREATMENT There is no cure for interstitial cystitis, but treatment methods are available to control your symptoms. Work closely with your health care provider to find the treatments that will be most effective for you. Treatment options may include:  Medicines to relieve pain and to help reduce the number of times that you feel the need to  urinate.  Bladder training. This involves learning ways to control when you urinate, such as:  Urinating at scheduled times.  Training yourself to delay urination.  Doing exercises (Kegel exercises) to strengthen the muscles that control urine flow.  Lifestyle changes, such as changing your diet or taking steps to control stress.  Use of a device that provides electrical stimulation in order to reduce pain.  A procedure that stretches your bladder by filling it with air or fluid.  Surgery. This is rare. It is only done for extreme cases if other treatments do not help. HOME CARE INSTRUCTIONS  Take medicines only as directed by your health care provider.  Use bladder training techniques as directed.  Keep a bladder diary to find out which foods, liquids, or activities make your symptoms worse.  Use your bladder diary to schedule bathroom trips. If you are away from home, plan to be near a bathroom at each of your scheduled times.  Make sure you urinate just before you leave the house and just before you go to bed.  Do Kegel exercises as directed by your health care provider.  Do not drink alcohol.  Do not use any tobacco products, including cigarettes, chewing tobacco, or electronic cigarettes. If you need help quitting, ask your health care provider.  Make dietary changes as directed by your health care provider. You may need to avoid spicy foods and foods that contain a high amount of potassium.  Limit your drinking of beverages that stimulate urination. These include soda, coffee, and tea.  Keep all follow-up   visits as directed by your health care provider. This is important. SEEK MEDICAL CARE IF:  Your symptoms do not get better after treatment.  Your pain and discomfort are getting worse.  You have more frequent urges to urinate.  You have a fever. SEEK IMMEDIATE MEDICAL CARE IF:  You are not able to control your bladder at all.     ++++++++++++++++++++++++++++++++++++++++++++++++  Urinary Tract Infection Urinary tract infections (UTIs) can develop anywhere along your urinary tract. Your urinary tract is your body's drainage system for removing wastes and extra water. Your urinary tract includes two kidneys, two ureters, a bladder, and a urethra. Your kidneys are a pair of bean-shaped organs. Each kidney is about the size of your fist. They are located below your ribs, one on each side of your spine. CAUSES Infections are caused by microbes, which are microscopic organisms, including fungi, viruses, and bacteria. These organisms are so small that they can only be seen through a microscope. Bacteria are the microbes that most commonly cause UTIs. SYMPTOMS  Symptoms of UTIs may vary by age and gender of the patient and by the location of the infection. Symptoms in young women typically include a frequent and intense urge to urinate and a painful, burning feeling in the bladder or urethra during urination. Older women and men are more likely to be tired, shaky, and weak and have muscle aches and abdominal pain. A fever may mean the infection is in your kidneys. Other symptoms of a kidney infection include pain in your back or sides below the ribs, nausea, and vomiting. DIAGNOSIS To diagnose a UTI, your caregiver will ask you about your symptoms. Your caregiver will also ask you to provide a urine sample. The urine sample will be tested for bacteria and white blood cells. White blood cells are made by your body to help fight infection. TREATMENT  Typically, UTIs can be treated with medication. Because most UTIs are caused by a bacterial infection, they usually can be treated with the use of antibiotics. The choice of antibiotic and length of treatment depend on your symptoms and the type of bacteria causing your infection. HOME CARE INSTRUCTIONS  If you were prescribed antibiotics, take them exactly as your caregiver instructs you.  Finish the medication even if you feel better after you have only taken some of the medication.  Drink enough water and fluids to keep your urine clear or pale yellow.  Avoid caffeine, tea, and carbonated beverages. They tend to irritate your bladder.  Empty your bladder often. Avoid holding urine for long periods of time.  Empty your bladder before and after sexual intercourse.  After a bowel movement, women should cleanse from front to back. Use each tissue only once. SEEK MEDICAL CARE IF:   You have back pain.  You develop a fever.  Your symptoms do not begin to resolve within 3 days. SEEK IMMEDIATE MEDICAL CARE IF:   You have severe back pain or lower abdominal pain.  You develop chills.  You have nausea or vomiting.  You have continued burning or discomfort with urination. MAKE SURE YOU:   Understand these instructions.  Will watch your condition.  Will get help right away if you are not doing well or get worse.   This information is not intended to replace advice given to you by your health care provider. Make sure you discuss any questions you have with your health care provider.   Document Released: 03/21/2005 Document Revised: 03/02/2015 Document Reviewed: 07/20/2011 Elsevier  Interactive Patient Education [employer].

## 2015-05-12 LAB — URINALYSIS, ROUTINE W REFLEX MICROSCOPIC
Bilirubin Urine: NEGATIVE
Glucose, UA: NEGATIVE
Hgb urine dipstick: NEGATIVE
KETONES UR: NEGATIVE
Leukocytes, UA: NEGATIVE
NITRITE: NEGATIVE
PH: 6 (ref 5.0–8.0)
Protein, ur: NEGATIVE
Specific Gravity, Urine: 1.015 (ref 1.001–1.035)

## 2015-05-12 LAB — URINE CULTURE: Colony Count: 25000

## 2015-06-08 ENCOUNTER — Other Ambulatory Visit: Payer: Self-pay | Admitting: Orthopaedic Surgery

## 2015-06-08 DIAGNOSIS — M25511 Pain in right shoulder: Secondary | ICD-10-CM

## 2015-06-09 ENCOUNTER — Ambulatory Visit (INDEPENDENT_AMBULATORY_CARE_PROVIDER_SITE_OTHER): Payer: PPO | Admitting: Internal Medicine

## 2015-06-09 ENCOUNTER — Other Ambulatory Visit: Payer: Self-pay | Admitting: Internal Medicine

## 2015-06-09 ENCOUNTER — Encounter: Payer: Self-pay | Admitting: Internal Medicine

## 2015-06-09 VITALS — BP 124/64 | HR 60 | Temp 97.5°F | Resp 16 | Ht 64.5 in | Wt 108.6 lb

## 2015-06-09 DIAGNOSIS — E559 Vitamin D deficiency, unspecified: Secondary | ICD-10-CM | POA: Diagnosis not present

## 2015-06-09 DIAGNOSIS — E785 Hyperlipidemia, unspecified: Secondary | ICD-10-CM

## 2015-06-09 DIAGNOSIS — Z79899 Other long term (current) drug therapy: Secondary | ICD-10-CM | POA: Diagnosis not present

## 2015-06-09 DIAGNOSIS — K219 Gastro-esophageal reflux disease without esophagitis: Secondary | ICD-10-CM | POA: Diagnosis not present

## 2015-06-09 DIAGNOSIS — G501 Atypical facial pain: Secondary | ICD-10-CM | POA: Diagnosis not present

## 2015-06-09 DIAGNOSIS — Z1331 Encounter for screening for depression: Secondary | ICD-10-CM

## 2015-06-09 DIAGNOSIS — Z1389 Encounter for screening for other disorder: Secondary | ICD-10-CM | POA: Diagnosis not present

## 2015-06-09 DIAGNOSIS — R7303 Prediabetes: Secondary | ICD-10-CM | POA: Diagnosis not present

## 2015-06-09 DIAGNOSIS — I10 Essential (primary) hypertension: Secondary | ICD-10-CM

## 2015-06-09 LAB — HEPATIC FUNCTION PANEL
ALK PHOS: 66 U/L (ref 33–130)
ALT: 14 U/L (ref 6–29)
AST: 21 U/L (ref 10–35)
Albumin: 4.2 g/dL (ref 3.6–5.1)
BILIRUBIN DIRECT: 0.1 mg/dL (ref <=0.2)
BILIRUBIN INDIRECT: 0.3 mg/dL (ref 0.2–1.2)
BILIRUBIN TOTAL: 0.4 mg/dL (ref 0.2–1.2)
Total Protein: 7 g/dL (ref 6.1–8.1)

## 2015-06-09 LAB — BASIC METABOLIC PANEL WITH GFR
BUN: 14 mg/dL (ref 7–25)
CHLORIDE: 97 mmol/L — AB (ref 98–110)
CO2: 28 mmol/L (ref 20–31)
Calcium: 9.3 mg/dL (ref 8.6–10.4)
Creat: 0.66 mg/dL (ref 0.60–0.93)
GFR, EST NON AFRICAN AMERICAN: 84 mL/min (ref >=60)
Glucose, Bld: 70 mg/dL (ref 65–99)
POTASSIUM: 4.8 mmol/L (ref 3.5–5.3)
Sodium: 134 mmol/L — ABNORMAL LOW (ref 135–146)

## 2015-06-09 LAB — CBC WITH DIFFERENTIAL/PLATELET
BASOS ABS: 0.1 10*3/uL (ref 0.0–0.1)
Basophils Relative: 1 % (ref 0–1)
EOS ABS: 0.2 10*3/uL (ref 0.0–0.7)
EOS PCT: 3 % (ref 0–5)
HEMATOCRIT: 35 % — AB (ref 36.0–46.0)
Hemoglobin: 11.9 g/dL — ABNORMAL LOW (ref 12.0–15.0)
LYMPHS ABS: 1.7 10*3/uL (ref 0.7–4.0)
LYMPHS PCT: 23 % (ref 12–46)
MCH: 31.6 pg (ref 26.0–34.0)
MCHC: 34 g/dL (ref 30.0–36.0)
MCV: 92.8 fL (ref 78.0–100.0)
MPV: 10.6 fL (ref 8.6–12.4)
Monocytes Absolute: 0.9 10*3/uL (ref 0.1–1.0)
Monocytes Relative: 13 % — ABNORMAL HIGH (ref 3–12)
NEUTROS PCT: 60 % (ref 43–77)
Neutro Abs: 4.3 10*3/uL (ref 1.7–7.7)
PLATELETS: 245 10*3/uL (ref 150–400)
RBC: 3.77 MIL/uL — ABNORMAL LOW (ref 3.87–5.11)
RDW: 13 % (ref 11.5–15.5)
WBC: 7.2 10*3/uL (ref 4.0–10.5)

## 2015-06-09 LAB — TSH: TSH: 0.974 u[IU]/mL (ref 0.350–4.500)

## 2015-06-09 LAB — LIPID PANEL
CHOL/HDL RATIO: 2.5 ratio (ref <=5.0)
Cholesterol: 154 mg/dL (ref 125–200)
HDL: 62 mg/dL (ref >=46)
LDL CALC: 56 mg/dL (ref <130)
TRIGLYCERIDES: 178 mg/dL — AB (ref <150)
VLDL: 36 mg/dL — AB (ref <30)

## 2015-06-09 LAB — MAGNESIUM: Magnesium: 1.9 mg/dL (ref 1.5–2.5)

## 2015-06-09 NOTE — Patient Instructions (Signed)

## 2015-06-09 NOTE — Progress Notes (Signed)
Patient ID: Debra White, female   DOB: 1933/07/06, 81 y.o.   MRN: [id number]   This very nice 81 y.o. MWF presents for 6 month follow up with Hypertension, Hyperlipidemia, Pre-Diabetes and Vitamin D Deficiency. In Jan 2013 she had a minor fall at home & sustained a R orbital Fx and has had chronic atypical pain since now apparently best controlled with Gabapentin. Also she has hx/o GERD controlled with diet & meds (Ranitidine).    Patient is treated for HTN & BP has been controlled at home. Today's BP: 124/64 mmHg. Patient has had no complaints of any cardiac type chest pain, palpitations, dyspnea/orthopnea/PND, dizziness, claudication, or dependent edema.   Hyperlipidemia is controlled with diet & meds. Patient denies myalgias or other med SE's. Last Lipids were at goal with Cholesterol 174; HDL 81; LDL 54; Triglycerides 195 on  03/10/2015.   Also, the patient has history of PreDiabetes since 2011 with A1c 6.8% and then with 30 3 Wt loss her A1c dropped to 5.8% or less since 2013 and she has had no symptoms of reactive hypoglycemia, diabetic polys, paresthesias or visual blurring.  Last A1c was 5.7% on 03/10/2015.   Further, the patient also has history of Vitamin D Deficiency and supplements vitamin D without any suspected side-effects. Last vitamin D was  57 on  03/10/2015.  Medication Sig  . TYLENOL ARTHRITIS  650 MG CR  Take 650 mg by mouth every 8 (eight) hours as needed for pain.  Marland Kitchen atenolol  100 MG tab TAKE 0.5 TABLET BY MOUTH EVERY MORNING)  . ASTELIN 0.1 % nasal spry Use 1 to 2 sprays each nostril 2 x day - morning & night for nasal drainage & congestion  . VITAMIN D 1000 UNITS tab Take 2,000 Units by mouth daily.  . clonazePAM  1 MG tab Takes half a tablet in the morning and mid-day then takes one full tablet at night.)  . dicyclomine  20 MG tab Take 1 tablet 3 x day before meals for bladder pains  . LEVSIN SL 0.125 MG SL tab Dissolve one tablet by mouth every 4-6 hours as needed  .  losartan  100 MG tablet Take 0.5 tablets (50 mg total) by mouth daily.  . pravastatin  40 MG tablet Take 1 tablet (40 mg total) by mouth daily. (Patient taking differently: Take 20 mg by mouth daily. )  . Probiotic FLORAJEN3  Take 1 tablet by mouth daily.   . ranitidine  300 MG tab Take 1 tablet 1 to 2 x day as needed for acid indigestion & redlux  . traMADol 50 MG tab TAKE 1 TABLET BY MOUTH 4 TIMES DAILY AS NEEDED FOR PAIN   Allergies  Allergen Reactions  . Latex Hives    TONGUE HAD BLISTERS WHEN HAD DENTAL SURGERY  . Amoxicillin Nausea And Vomiting  . Chlorhexidine Gluconate Itching  . Aspirin     Gi upset  . Barbiturates   . Celebrex [Celecoxib]   . Codeine Itching  . Evista [Raloxifene]   . Lipitor [Atorvastatin]   . Morphine And Related   . Nsaids   . Oruvail [Ketoprofen]   . Paraffin   . Prochlorperazine Other (See Comments)    Uncontrolled shaking  . Proloprim [Trimethoprim]   . Vibra-Tab [Doxycycline]   . Vioxx [Rofecoxib]     edema  . Betadine [Povidone Iodine] Rash  . Caffeine Palpitations    PMHx:   Past Medical History  Diagnosis Date  . Fibromyalgia   .  Anxiety   . Dysrhythmia     RX  . Blood transfusion   . Chronic kidney disease     STONES  . Headache(784.0)   . Arthritis   . Cataract   . Diverticulosis of colon (without mention of hemorrhage) 2007    Colonoscopy  . Candida esophagitis ([place]) 2013    EGD  . Family history of colon cancer     sister  . Hypertension   . Hyperlipemia   . GERD (gastroesophageal reflux disease)   . Emphysema of lung ([place])   . Kidney stones    Immunization History  Administered Date(s) Administered  . Influenza, High Dose Seasonal PF 03/25/2014  . Influenza-Unspecified 03/09/2013, 03/11/2015  . Pneumococcal Polysaccharide-23 06/25/2002  . Td 06/26/2003   Past Surgical History  Procedure Laterality Date  . Abdominal hysterectomy    . Hand surgery      BIL   . Shoulder arthroscopy w/ rotator cuff repair       LFT  . Cervical disc surgery    . Back surgery      X2  . Orif tripod fracture  07/13/2011    Procedure: OPEN REDUCTION INTERNAL FIXATION (ORIF) TRIPOD FRACTURE;  Surgeon: Tyson Alias, MD;  Location: [facility];  Service: ENT;  Laterality: Right;  ORIF RIGHT ZYGOMA, ORBITAL FLOOR EXPLORATION WITH FROST STITCH (TEMPORARY TARSORRHAPHY)  . Colonoscopy    . Facial fracture surgery    . Back surgery     FHx:    Reviewed / unchanged  SHx:    Reviewed / unchanged  Systems Review:  Constitutional: Denies fever, chills, wt changes, headaches, insomnia, fatigue, night sweats, change in appetite. Eyes: Denies redness, blurred vision, diplopia, discharge, itchy, watery eyes.  ENT: Denies discharge, congestion, post nasal drip, epistaxis, sore throat, earache, hearing loss, dental pain, tinnitus, vertigo, sinus pain, snoring.  CV: Denies chest pain, palpitations, irregular heartbeat, syncope, dyspnea, diaphoresis, orthopnea, PND, claudication or edema. Respiratory: denies cough, dyspnea, DOE, pleurisy, hoarseness, laryngitis, wheezing.  Gastrointestinal: Denies dysphagia, odynophagia, heartburn, reflux, water brash, abdominal pain or cramps, nausea, vomiting, bloating, diarrhea, constipation, hematemesis, melena, hematochezia  or hemorrhoids. Genitourinary: Denies dysuria, frequency, urgency, nocturia, hesitancy, discharge, hematuria or flank pain. Musculoskeletal: Denies arthralgias, myalgias, stiffness, jt. swelling, pain, limping or strain/sprain.  Skin: Denies pruritus, rash, hives, warts, acne, eczema or change in skin lesion(s). Neuro: No weakness, tremor, incoordination, spasms, paresthesia or pain. Psychiatric: Denies confusion, memory loss or sensory loss. Endo: Denies change in weight, skin or hair change.  Heme/Lymph: No excessive bleeding, bruising or enlarged lymph nodes.  Physical Exam  BP 124/64 mmHg  Pulse 60  Temp(Src) 97.5 F (36.4 C)  Resp 16  Ht 5' 4.5" (1.638 m)  Wt 108 lb  9.6 oz (49.261 kg)  BMI 18.36 kg/m2  Appears well nourished and in no distress. Eyes: PERRLA, EOMs, conjunctiva no swelling or erythema. Sinuses: No frontal/maxillary tenderness ENT/Mouth: EAC's clear, TM's nl w/o erythema, bulging. Nares clear w/o erythema, swelling, exudates. Oropharynx clear without erythema or exudates. Oral hygiene is good. Tongue normal, non obstructing. Hearing intact.  Neck: Supple. Thyroid nl. Car 2+/2+ without bruits, nodes or JVD. Chest: Respirations nl with BS clear & equal w/o rales, rhonchi, wheezing or stridor.  Cor: Heart sounds normal w/ regular rate and rhythm without sig. murmurs, gallops, clicks, or rubs. Peripheral pulses normal and equal  without edema.  Abdomen: Soft & bowel sounds normal. Non-tender w/o guarding, rebound, hernias, masses, or organomegaly.  Lymphatics: Unremarkable.  Musculoskeletal: Full  ROM all peripheral extremities, joint stability, 5/5 strength, and normal gait.  Skin: Warm, dry without exposed rashes, lesions or ecchymosis apparent.  Neuro: Cranial nerves intact, reflexes equal bilaterally. Sensory-motor testing grossly intact. Tendon reflexes grossly intact.  Pysch: Alert & oriented x 3.  Insight and judgement nl & appropriate. No ideations.  Assessment and Plan:  1. Essential hypertension  - TSH  2. Hyperlipemia  - Lipid panel - TSH  3. Prediabetes  - Hemoglobin A1c - Insulin, random  4. Vitamin D deficiency  - VITAMIN D 25 Hydroxy   5. Gastroesophageal reflux disease   6. Atypical facial pain   7. Medication management  - CBC with Differential/Platelet - BASIC METABOLIC PANEL WITH GFR - Hepatic function panel - Magnesium   Recommended regular exercise, BP monitoring, weight control, and discussed med and SE's. Recommended labs to assess and monitor clinical status. Further disposition pending results of labs. Over 30 minutes of exam, counseling, chart review was performed

## 2015-06-10 LAB — HEMOGLOBIN A1C
Hgb A1c MFr Bld: 5.3 % (ref <5.7)
Mean Plasma Glucose: 105 mg/dL (ref <117)

## 2015-06-10 LAB — INSULIN, RANDOM: INSULIN: 2.8 u[IU]/mL (ref 2.0–19.6)

## 2015-06-10 LAB — VITAMIN D 25 HYDROXY (VIT D DEFICIENCY, FRACTURES): Vit D, 25-Hydroxy: 64 ng/mL (ref 30–100)

## 2015-06-12 ENCOUNTER — Ambulatory Visit
Admission: RE | Admit: 2015-06-12 | Discharge: 2015-06-12 | Disposition: A | Discharge status: Home / Self Care | Payer: PPO | Source: Ambulatory Visit | Attending: Orthopaedic Surgery | Admitting: Orthopaedic Surgery

## 2015-06-12 DIAGNOSIS — M25511 Pain in right shoulder: Secondary | ICD-10-CM

## 2015-06-13 ENCOUNTER — Ambulatory Visit: Payer: Self-pay | Admitting: Internal Medicine

## 2015-06-22 ENCOUNTER — Other Ambulatory Visit: Payer: Self-pay | Admitting: Internal Medicine

## 2015-06-24 ENCOUNTER — Other Ambulatory Visit: Payer: Self-pay | Admitting: Internal Medicine

## 2015-06-27 ENCOUNTER — Emergency Department (HOSPITAL_COMMUNITY): Payer: PPO

## 2015-06-27 ENCOUNTER — Encounter (HOSPITAL_COMMUNITY): Payer: Self-pay | Admitting: Emergency Medicine

## 2015-06-27 ENCOUNTER — Emergency Department (HOSPITAL_COMMUNITY)
Admission: EM | Admit: 2015-06-27 | Discharge: 2015-06-27 | Disposition: A | Discharge status: Home / Self Care | Payer: PPO | Attending: Emergency Medicine | Admitting: Emergency Medicine

## 2015-06-27 DIAGNOSIS — N189 Chronic kidney disease, unspecified: Secondary | ICD-10-CM | POA: Insufficient documentation

## 2015-06-27 DIAGNOSIS — Z8739 Personal history of other diseases of the musculoskeletal system and connective tissue: Secondary | ICD-10-CM | POA: Insufficient documentation

## 2015-06-27 DIAGNOSIS — W1839XA Other fall on same level, initial encounter: Secondary | ICD-10-CM | POA: Diagnosis not present

## 2015-06-27 DIAGNOSIS — K219 Gastro-esophageal reflux disease without esophagitis: Secondary | ICD-10-CM | POA: Diagnosis not present

## 2015-06-27 DIAGNOSIS — Z87442 Personal history of urinary calculi: Secondary | ICD-10-CM | POA: Insufficient documentation

## 2015-06-27 DIAGNOSIS — R102 Pelvic and perineal pain: Secondary | ICD-10-CM | POA: Diagnosis not present

## 2015-06-27 DIAGNOSIS — Y998 Other external cause status: Secondary | ICD-10-CM | POA: Insufficient documentation

## 2015-06-27 DIAGNOSIS — Z8619 Personal history of other infectious and parasitic diseases: Secondary | ICD-10-CM | POA: Diagnosis not present

## 2015-06-27 DIAGNOSIS — S79912A Unspecified injury of left hip, initial encounter: Secondary | ICD-10-CM | POA: Diagnosis not present

## 2015-06-27 DIAGNOSIS — Y9389 Activity, other specified: Secondary | ICD-10-CM | POA: Diagnosis not present

## 2015-06-27 DIAGNOSIS — S3993XA Unspecified injury of pelvis, initial encounter: Secondary | ICD-10-CM | POA: Diagnosis not present

## 2015-06-27 DIAGNOSIS — R51 Headache: Secondary | ICD-10-CM | POA: Diagnosis not present

## 2015-06-27 DIAGNOSIS — I129 Hypertensive chronic kidney disease with stage 1 through stage 4 chronic kidney disease, or unspecified chronic kidney disease: Secondary | ICD-10-CM | POA: Insufficient documentation

## 2015-06-27 DIAGNOSIS — J439 Emphysema, unspecified: Secondary | ICD-10-CM | POA: Insufficient documentation

## 2015-06-27 DIAGNOSIS — R0781 Pleurodynia: Secondary | ICD-10-CM | POA: Diagnosis not present

## 2015-06-27 DIAGNOSIS — H269 Unspecified cataract: Secondary | ICD-10-CM | POA: Insufficient documentation

## 2015-06-27 DIAGNOSIS — S0990XA Unspecified injury of head, initial encounter: Secondary | ICD-10-CM | POA: Insufficient documentation

## 2015-06-27 DIAGNOSIS — S8001XA Contusion of right knee, initial encounter: Secondary | ICD-10-CM | POA: Diagnosis not present

## 2015-06-27 DIAGNOSIS — M25552 Pain in left hip: Secondary | ICD-10-CM | POA: Diagnosis not present

## 2015-06-27 DIAGNOSIS — F419 Anxiety disorder, unspecified: Secondary | ICD-10-CM | POA: Insufficient documentation

## 2015-06-27 DIAGNOSIS — T148 Other injury of unspecified body region: Secondary | ICD-10-CM | POA: Diagnosis not present

## 2015-06-27 DIAGNOSIS — Y92002 Bathroom of unspecified non-institutional (private) residence single-family (private) house as the place of occurrence of the external cause: Secondary | ICD-10-CM | POA: Diagnosis not present

## 2015-06-27 DIAGNOSIS — T148XXA Other injury of unspecified body region, initial encounter: Secondary | ICD-10-CM

## 2015-06-27 DIAGNOSIS — E785 Hyperlipidemia, unspecified: Secondary | ICD-10-CM | POA: Insufficient documentation

## 2015-06-27 DIAGNOSIS — Z880 Allergy status to penicillin: Secondary | ICD-10-CM | POA: Diagnosis not present

## 2015-06-27 DIAGNOSIS — Z91040 Latex allergy status: Secondary | ICD-10-CM | POA: Diagnosis not present

## 2015-06-27 DIAGNOSIS — W19XXXA Unspecified fall, initial encounter: Secondary | ICD-10-CM

## 2015-06-27 MED ORDER — KETOROLAC TROMETHAMINE 60 MG/2ML IM SOLN
60.0000 mg | Freq: Once | INTRAMUSCULAR | Status: AC
  Administered 2015-06-27: 60 mg via INTRAMUSCULAR
  Filled 2015-06-27: qty 2

## 2015-06-27 MED ORDER — DICLOFENAC SODIUM 1 % TD GEL: 4.0000 g | Freq: Four times a day (QID) | TRANSDERMAL | Status: DC

## 2015-06-27 NOTE — ED Notes (Signed)
Pt states she got up to go to the bathroom tonight and her right knee gave out on her causing her to fall  Pt states when she fell she landed on a glass table in her bathroom  Pt is c/o pain to her left side from her hip area up to her rib area  Pt has some redness noted  Pt states she took a pain pill at home but has not gotten any relief

## 2015-06-27 NOTE — Discharge Instructions (Signed)

## 2015-06-27 NOTE — ED Provider Notes (Signed)
CSN: [id number]     Arrival date & time 06/27/15  0230 History   First MD Initiated Contact with Patient 06/27/15 [phone]     Chief Complaint  Patient presents with  . Fall     (Consider location/radiation/quality/duration/timing/severity/associated sxs/prior Treatment) Patient is a 82 y.o. female presenting with fall. The history is provided by the patient.  Fall This is a new problem. The current episode started 3 to 5 hours ago. The problem occurs constantly. Pertinent negatives include no chest pain, no abdominal pain, no headaches and no shortness of breath. Nothing aggravates the symptoms. Nothing relieves the symptoms. Treatments tried: ultram. The treatment provided no relief.    Past Medical History  Diagnosis Date  . Fibromyalgia   . Anxiety   . Dysrhythmia     RX  . Blood transfusion   . Chronic kidney disease     STONES  . Headache(784.0)   . Arthritis   . Cataract   . Diverticulosis of colon (without mention of hemorrhage) 2007    Colonoscopy  . Candida esophagitis ([place]) 2013    EGD  . Family history of colon cancer     sister  . Hypertension   . Hyperlipemia   . GERD (gastroesophageal reflux disease)   . Emphysema of lung ([facility])   . Kidney stones    Past Surgical History  Procedure Laterality Date  . Abdominal hysterectomy    . Hand surgery      BIL   . Shoulder arthroscopy w/ rotator cuff repair      LFT  . Cervical disc surgery    . Back surgery      X2  . Orif tripod fracture  07/13/2011    Procedure: OPEN REDUCTION INTERNAL FIXATION (ORIF) TRIPOD FRACTURE;  Surgeon: Tyson Alias, MD;  Location: [facility];  Service: ENT;  Laterality: Right;  ORIF RIGHT ZYGOMA, ORBITAL FLOOR EXPLORATION WITH FROST STITCH (TEMPORARY TARSORRHAPHY)  . Colonoscopy    . Facial fracture surgery    . Back surgery     Family History  Problem Relation Age of Onset  . Heart disease Mother   . Breast cancer Sister   . Colon cancer Sister   . Cancer Sister     breast  .  Cancer Sister     colon  . Cancer Sister     colon  . Heart disease Father   . Hypertension Daughter   . Hyperlipidemia Daughter    Social History  Substance Use Topics  . Smoking status: Never Smoker   . Smokeless tobacco: Never Used  . Alcohol Use: No   OB History    No data available     Review of Systems  Respiratory: Negative for shortness of breath.   Cardiovascular: Negative for chest pain.  Gastrointestinal: Negative for abdominal pain.  Neurological: Negative for headaches.  All other systems reviewed and are negative.     Allergies  Latex; Amoxicillin; Chlorhexidine gluconate; Aspirin; Barbiturates; Celebrex; Codeine; Evista; Lipitor; Morphine and related; Nsaids; Oruvail; Paraffin; Prochlorperazine; Proloprim; Vibra-tab; Vioxx; Betadine; and Caffeine  Home Medications   Prior to Admission medications   Medication Sig Start Date End Date Taking? Authorizing Provider  atenolol (TENORMIN) 100 MG tablet TAKE 1 TABLET BY MOUTH EVERY MORNING Patient taking differently: TAKE 0.5 TABLET BY MOUTH EVERY MORNING 12/21/14  Yes Unk Pinto, MD  cholecalciferol (VITAMIN D) 1000 UNITS tablet Take 5,000 Units by mouth daily.    Yes Historical Provider, MD  clonazePAM (KLONOPIN) 1 MG  tablet TAKE 1/2-1 TABLET BY MOUTH UP TO 3 TIMESDAILY IF NEEDED FOR NERVES 06/22/15  Yes Vicie Mutters, PA-C  dicyclomine (BENTYL) 20 MG tablet Take 1 tablet 3 x day before meals for bladder pains Patient taking differently: Take 20 mg by mouth 3 (three) times daily. for bladder pains 05/11/15  Yes Unk Pinto, MD  gabapentin (NEURONTIN) 300 MG capsule TAKE 1 CAPSULE BY MOUTH 3-4 TIMES DAILY FOR FACIAL PAIN 06/09/15  Yes Unk Pinto, MD  losartan (COZAAR) 100 MG tablet TAKE 1/2 TABLET BY MOUTH DAILY 06/24/15  Yes Unk Pinto, MD  pravastatin (PRAVACHOL) 40 MG tablet Take 1 tablet (40 mg total) by mouth daily. Patient taking differently: Take 20 mg by mouth daily.  09/02/14  Yes Unk Pinto, MD  Probiotic Product (FLORAJEN3 PO) Take 1 tablet by mouth daily.    Yes Historical Provider, MD  ranitidine (ZANTAC) 300 MG tablet Take 1 tablet 1 to 2 x day as needed for acid indigestion & redlux 10/04/14 10/04/15 Yes Unk Pinto, MD  traMADol (ULTRAM) 50 MG tablet TAKE 1 TABLET BY MOUTH 4 TIMES DAILY AS NEEDED FOR PAIN 05/07/15  Yes Unk Pinto, MD  azelastine (ASTELIN) 0.1 % nasal spray Use 1 to 2 sprays each nostril 2 x day - morning & night for nasal drainage & congestion Patient not taking: Reported on 06/27/2015 07/26/14 07/27/15  Unk Pinto, MD  hyoscyamine (LEVSIN SL) 0.125 MG SL tablet Dissolve one tablet by mouth every 4-6 hours as needed Patient not taking: Reported on 06/27/2015 04/14/15   Vicie Mutters, PA-C   BP 152/78 mmHg  Pulse 70  Temp(Src) 97.8 F (36.6 C) (Oral)  Resp 16  SpO2 100% Physical Exam  Constitutional: She is oriented to person, place, and time. She appears well-developed and well-nourished. No distress.  HENT:  Head: Normocephalic and atraumatic. Head is without raccoon's eyes and without Battle's sign.  Right Ear: No hemotympanum.  Left Ear: No hemotympanum.  Mouth/Throat: Oropharynx is clear and moist.  Eyes: Conjunctivae are normal. Pupils are equal, round, and reactive to light.  Neck: Normal range of motion. Neck supple.  Cardiovascular: Normal rate, regular rhythm and intact distal pulses.   Pulmonary/Chest: Effort normal and breath sounds normal. She has no wheezes. She has no rales.  Abdominal: Soft. Bowel sounds are normal. There is no tenderness. There is no rebound and no guarding.  Musculoskeletal: Normal range of motion. She exhibits no edema or tenderness.       Left hip: Normal.       Cervical back: Normal.  Neurological: She is alert and oriented to person, place, and time.  Skin: Skin is warm and dry.  Psychiatric: She has a normal mood and affect.    ED Course  Procedures (including critical care time) Labs  Review Labs Reviewed - No data to display  Imaging Review Dg Chest 2 View  06/27/2015  CLINICAL DATA:  Status post fall, with left rib pain and erythema. Initial encounter. EXAM: CHEST  2 VIEW COMPARISON:  Chest radiograph performed 07/10/2012 FINDINGS: The lungs are well-aerated and clear. There is no evidence of focal opacification, pleural effusion or pneumothorax. The heart is borderline enlarged. No acute osseous abnormalities are seen. IMPRESSION: Borderline cardiomegaly. Lungs remain grossly clear. No displaced rib fracture seen. Electronically Signed   By: Garald Balding M.D.   On: 06/27/2015 05:04   Ct Head Wo Contrast  06/27/2015  CLINICAL DATA:  Status post fall onto glass table. Headache. Initial encounter. EXAM: CT HEAD  WITHOUT CONTRAST TECHNIQUE: Contiguous axial images were obtained from the base of the skull through the vertex without intravenous contrast. COMPARISON:  CT of the head and MRI/MRA of the brain performed 03/25/2013 FINDINGS: There is no evidence of acute infarction, mass lesion, or intra- or extra-axial hemorrhage on CT. The posterior fossa, including the cerebellum, brainstem and fourth ventricle, is within normal limits. The third and lateral ventricles, and basal ganglia are unremarkable in appearance. The cerebral hemispheres are symmetric in appearance, with normal gray-white differentiation. No mass effect or midline shift is seen. There is no evidence of fracture; a plate and screws are noted along the anterior right maxilla. The orbits are within normal limits. The paranasal sinuses and mastoid air cells are well-aerated. No significant soft tissue abnormalities are seen. IMPRESSION: No evidence of traumatic intracranial injury or fracture. Electronically Signed   By: Garald Balding M.D.   On: 06/27/2015 04:17   Ct Pelvis Wo Contrast  06/27/2015  CLINICAL DATA:  Pelvic pain status post fall.  Initial encounter. EXAM: CT PELVIS WITHOUT CONTRAST TECHNIQUE: Multidetector CT  imaging of the pelvis was performed following the standard protocol without intravenous contrast. COMPARISON:  CT of the abdomen and pelvis performed 04/13/2015 FINDINGS: There is no evidence of fracture or dislocation. The proximal femurs appear intact bilaterally, and are seated at their respective acetabula. Vacuum phenomenon is noted at the sacroiliac joints bilaterally. Note is made of chronic grade 2 anterolisthesis of L4 on L5, with chronic osseous fusion at L4-L5. Diffuse calcification is noted along the distal abdominal aorta and its branches. Scattered diverticulosis is noted along the sigmoid colon, without evidence of diverticulitis. The bladder is mildly distended and grossly unremarkable. The patient is status post hysterectomy. No suspicious adnexal masses are seen. IMPRESSION: 1. No evidence of fracture or dislocation. 2. Chronic grade 2 retrolisthesis of L4 on L5, with chronic osseous fusion at L4-L5. 3. Diffuse calcification along the distal abdominal aorta and its branches. 4. Scattered diverticulosis along the sigmoid colon, without evidence of diverticulitis. Electronically Signed   By: Garald Balding M.D.   On: 06/27/2015 06:12   Dg Hip Unilat With Pelvis 2-3 Views Left  06/27/2015  CLINICAL DATA:  Status post fall, with left hip pain. Initial encounter. EXAM: DG HIP (WITH OR WITHOUT PELVIS) 2-3V LEFT COMPARISON:  None. FINDINGS: There is no evidence of fracture or dislocation. Both femoral heads are seated normally within their respective acetabula. The proximal left femur appears intact. Mild sclerotic change is noted at the sacroiliac joints. The visualized bowel gas pattern is grossly unremarkable in appearance. Postoperative change is noted along the lower lumbar spine. IMPRESSION: No evidence of fracture or dislocation. Electronically Signed   By: Garald Balding M.D.   On: 06/27/2015 05:04   I have personally reviewed and evaluated these images and lab results as part of my medical  decision-making.   EKG Interpretation None      MDM   Final diagnoses:  None    Will add voltaren gel to patient's ultram therapy.  Follow up with your PMD for ongoing care.      Veatrice Kells, MD 06/27/15 [phone]

## 2015-06-29 ENCOUNTER — Other Ambulatory Visit: Payer: Self-pay | Admitting: Internal Medicine

## 2015-06-29 DIAGNOSIS — N6310 Unspecified lump in the right breast, unspecified quadrant: Secondary | ICD-10-CM

## 2015-07-06 DIAGNOSIS — S2232XA Fracture of one rib, left side, initial encounter for closed fracture: Secondary | ICD-10-CM | POA: Diagnosis not present

## 2015-07-07 DIAGNOSIS — M7541 Impingement syndrome of right shoulder: Secondary | ICD-10-CM | POA: Diagnosis not present

## 2015-07-07 DIAGNOSIS — M25511 Pain in right shoulder: Secondary | ICD-10-CM | POA: Diagnosis not present

## 2015-07-07 DIAGNOSIS — M75111 Incomplete rotator cuff tear or rupture of right shoulder, not specified as traumatic: Secondary | ICD-10-CM | POA: Diagnosis not present

## 2015-07-07 DIAGNOSIS — M6281 Muscle weakness (generalized): Secondary | ICD-10-CM | POA: Diagnosis not present

## 2015-07-18 DIAGNOSIS — S2232XD Fracture of one rib, left side, subsequent encounter for fracture with routine healing: Secondary | ICD-10-CM | POA: Diagnosis not present

## 2015-08-05 ENCOUNTER — Ambulatory Visit
Admission: RE | Admit: 2015-08-05 | Discharge: 2015-08-05 | Disposition: A | Discharge status: Home / Self Care | Payer: PPO | Source: Ambulatory Visit | Attending: Internal Medicine | Admitting: Internal Medicine

## 2015-08-05 DIAGNOSIS — N6489 Other specified disorders of breast: Secondary | ICD-10-CM | POA: Diagnosis not present

## 2015-08-05 DIAGNOSIS — N6310 Unspecified lump in the right breast, unspecified quadrant: Secondary | ICD-10-CM

## 2015-08-05 DIAGNOSIS — N63 Unspecified lump in breast: Secondary | ICD-10-CM | POA: Diagnosis not present

## 2015-08-08 DIAGNOSIS — H2511 Age-related nuclear cataract, right eye: Secondary | ICD-10-CM | POA: Diagnosis not present

## 2015-08-08 DIAGNOSIS — H25811 Combined forms of age-related cataract, right eye: Secondary | ICD-10-CM | POA: Diagnosis not present

## 2015-08-09 DIAGNOSIS — H2512 Age-related nuclear cataract, left eye: Secondary | ICD-10-CM | POA: Diagnosis not present

## 2015-08-22 DIAGNOSIS — H2512 Age-related nuclear cataract, left eye: Secondary | ICD-10-CM | POA: Diagnosis not present

## 2015-08-22 DIAGNOSIS — H25812 Combined forms of age-related cataract, left eye: Secondary | ICD-10-CM | POA: Diagnosis not present

## 2015-09-13 ENCOUNTER — Other Ambulatory Visit: Payer: Self-pay | Admitting: Internal Medicine

## 2015-09-13 MED ORDER — DICLOFENAC SODIUM 1 % TD GEL: 4.0000 g | Freq: Four times a day (QID) | TRANSDERMAL | Status: DC

## 2015-09-20 ENCOUNTER — Encounter: Payer: Self-pay | Admitting: Internal Medicine

## 2015-09-20 ENCOUNTER — Ambulatory Visit (INDEPENDENT_AMBULATORY_CARE_PROVIDER_SITE_OTHER): Payer: PPO | Admitting: Internal Medicine

## 2015-09-20 VITALS — BP 120/62 | HR 60 | Temp 97.5°F | Resp 16 | Ht 64.0 in | Wt 110.8 lb

## 2015-09-20 DIAGNOSIS — G501 Atypical facial pain: Secondary | ICD-10-CM

## 2015-09-20 DIAGNOSIS — Z1212 Encounter for screening for malignant neoplasm of rectum: Secondary | ICD-10-CM

## 2015-09-20 DIAGNOSIS — R6889 Other general symptoms and signs: Secondary | ICD-10-CM | POA: Diagnosis not present

## 2015-09-20 DIAGNOSIS — R7303 Prediabetes: Secondary | ICD-10-CM

## 2015-09-20 DIAGNOSIS — K219 Gastro-esophageal reflux disease without esophagitis: Secondary | ICD-10-CM

## 2015-09-20 DIAGNOSIS — E559 Vitamin D deficiency, unspecified: Secondary | ICD-10-CM

## 2015-09-20 DIAGNOSIS — Z79899 Other long term (current) drug therapy: Secondary | ICD-10-CM

## 2015-09-20 DIAGNOSIS — Z0001 Encounter for general adult medical examination with abnormal findings: Secondary | ICD-10-CM | POA: Diagnosis not present

## 2015-09-20 DIAGNOSIS — R7309 Other abnormal glucose: Secondary | ICD-10-CM | POA: Diagnosis not present

## 2015-09-20 DIAGNOSIS — I10 Essential (primary) hypertension: Secondary | ICD-10-CM | POA: Diagnosis not present

## 2015-09-20 DIAGNOSIS — Z0000 Encounter for general adult medical examination without abnormal findings: Secondary | ICD-10-CM | POA: Diagnosis not present

## 2015-09-20 DIAGNOSIS — Z681 Body mass index (BMI) 19 or less, adult: Secondary | ICD-10-CM

## 2015-09-20 DIAGNOSIS — E785 Hyperlipidemia, unspecified: Secondary | ICD-10-CM

## 2015-09-20 LAB — LIPID PANEL
CHOLESTEROL: 170 mg/dL (ref 125–200)
HDL: 68 mg/dL (ref >=46)
LDL Cholesterol: 67 mg/dL (ref <130)
TRIGLYCERIDES: 177 mg/dL — AB (ref <150)
Total CHOL/HDL Ratio: 2.5 Ratio (ref <=5.0)
VLDL: 35 mg/dL — ABNORMAL HIGH (ref <30)

## 2015-09-20 LAB — BASIC METABOLIC PANEL WITH GFR
BUN: 14 mg/dL (ref 7–25)
CALCIUM: 9.4 mg/dL (ref 8.6–10.4)
CO2: 29 mmol/L (ref 20–31)
Chloride: 96 mmol/L — ABNORMAL LOW (ref 98–110)
Creat: 0.69 mg/dL (ref 0.60–0.93)
GFR, EST NON AFRICAN AMERICAN: 83 mL/min (ref >=60)
GLUCOSE: 84 mg/dL (ref 65–99)
POTASSIUM: 5 mmol/L (ref 3.5–5.3)
Sodium: 136 mmol/L (ref 135–146)

## 2015-09-20 LAB — HEPATIC FUNCTION PANEL
ALBUMIN: 4.2 g/dL (ref 3.6–5.1)
ALK PHOS: 68 U/L (ref 33–130)
ALT: 12 U/L (ref 6–29)
AST: 19 U/L (ref 10–35)
BILIRUBIN INDIRECT: 0.4 mg/dL (ref 0.2–1.2)
BILIRUBIN TOTAL: 0.5 mg/dL (ref 0.2–1.2)
Bilirubin, Direct: 0.1 mg/dL (ref <=0.2)
TOTAL PROTEIN: 7.5 g/dL (ref 6.1–8.1)

## 2015-09-20 LAB — CBC WITH DIFFERENTIAL/PLATELET
BASOS ABS: 0 10*3/uL (ref 0.0–0.1)
BASOS PCT: 0 % (ref 0–1)
EOS ABS: 0.2 10*3/uL (ref 0.0–0.7)
EOS PCT: 2 % (ref 0–5)
HEMATOCRIT: 37.7 % (ref 36.0–46.0)
Hemoglobin: 12.6 g/dL (ref 12.0–15.0)
Lymphocytes Relative: 21 % (ref 12–46)
Lymphs Abs: 1.6 10*3/uL (ref 0.7–4.0)
MCH: 31 pg (ref 26.0–34.0)
MCHC: 33.4 g/dL (ref 30.0–36.0)
MCV: 92.6 fL (ref 78.0–100.0)
MONO ABS: 0.8 10*3/uL (ref 0.1–1.0)
MPV: 10.2 fL (ref 8.6–12.4)
Monocytes Relative: 10 % (ref 3–12)
Neutro Abs: 5.2 10*3/uL (ref 1.7–7.7)
Neutrophils Relative %: 67 % (ref 43–77)
PLATELETS: 270 10*3/uL (ref 150–400)
RBC: 4.07 MIL/uL (ref 3.87–5.11)
RDW: 13.1 % (ref 11.5–15.5)
WBC: 7.8 10*3/uL (ref 4.0–10.5)

## 2015-09-20 LAB — TSH: TSH: 0.84 mIU/L

## 2015-09-20 LAB — MAGNESIUM: Magnesium: 2 mg/dL (ref 1.5–2.5)

## 2015-09-20 NOTE — Patient Instructions (Signed)
Recommend Adult Low Dose Aspirin or   coated  Aspirin 81 mg daily   To reduce risk of Colon Cancer 20 %,   Skin Cancer 26 % ,   Melanoma 46%   and   Pancreatic cancer 60%   ++++++++++++++++++++++++++++++++++++++++++++++++++++++ Vitamin D goal   is between 70-100.   Please make sure that you are taking your Vitamin D as directed.   It is very important as a natural anti-inflammatory   helping hair, skin, and nails, as well as reducing stroke and heart attack risk.   It helps your bones and helps with mood.  It also decreases numerous cancer risks so please take it as directed.   Low Vit D is associated with a 200-300% higher risk for CANCER   and 200-300% higher risk for HEART   ATTACK  &  STROKE.   .....................................Marland Kitchen  It is also associated with higher death rate at younger ages,   autoimmune diseases like Rheumatoid arthritis, Lupus, Multiple Sclerosis.     Also many other serious conditions, like depression, Alzheimer's  Dementia, infertility, muscle aches, fatigue, fibromyalgia - just to name a few.  ++++++++++++++++++++++++++++++++++++++++++++++++  Recommend the book "The END of DIETING" by Dr Excell Seltzer   & the book "The END of DIABETES " by Dr Excell Seltzer  At [email or web address] - get book & Audio CD's     Being diabetic has a  300% increased risk for heart attack, stroke, cancer, and alzheimer- type vascular dementia. It is very important that you work harder with diet by avoiding all foods that are white. Avoid white rice (brown & wild rice is OK), white potatoes (sweetpotatoes in moderation is OK), White bread or wheat bread or anything made out of white flour like bagels, donuts, rolls, buns, biscuits, cakes, pastries, cookies, pizza crust, and pasta (made from white flour & egg whites) - vegetarian pasta or spinach or wheat pasta is OK. Multigrain breads like Arnold's or Pepperidge [employer], or multigrain sandwich thins or flatbreads.  Diet,  exercise and weight loss can reverse and cure diabetes in the early stages.  Diet, exercise and weight loss is very important in the control and prevention of complications of diabetes which affects every system in your body, ie. Brain - dementia/stroke, eyes - glaucoma/blindness, heart - heart attack/heart failure, kidneys - dialysis, stomach - gastric paralysis, intestines - malabsorption, nerves - severe painful neuritis, circulation - gangrene & loss of a leg(s), and finally cancer and Alzheimers.    I recommend avoid fried & greasy foods,  sweets/candy, white rice (brown or wild rice or Quinoa is OK), white potatoes (sweet potatoes are OK) - anything made from white flour - bagels, doughnuts, rolls, buns, biscuits,white and wheat breads, pizza crust and traditional pasta made of white flour & egg white(vegetarian pasta or spinach or wheat pasta is OK).  Multi-grain bread is OK - like multi-grain flat bread or sandwich thins. Avoid alcohol in excess. Exercise is also important.    Eat all the vegetables you want - avoid meat, especially red meat and dairy - especially cheese.  Cheese is the most concentrated form of trans-fats which is the worst thing to clog up our arteries. Veggie cheese is OK which can be found in the fresh produce section at [employer] or [employer] or [employer]  ++++++++++++++++++++++++++++++++++++++++++++++++++ DASH Eating Plan  DASH stands for "Dietary Approaches to Stop Hypertension."   The DASH eating plan is a healthy eating plan that has been shown to reduce high blood  pressure (hypertension). Additional health benefits may include reducing the risk of type 2 diabetes mellitus, heart disease, and stroke. The DASH eating plan may also help with weight loss.  WHAT DO I NEED TO KNOW ABOUT THE DASH EATING PLAN?  For the DASH eating plan, you will follow these general guidelines:  Choose foods with a percent daily value for sodium of less than 5% (as listed on the food  label).  Use salt-free seasonings or herbs instead of table salt or sea salt.  Check with your health care provider or pharmacist before using salt substitutes.  Eat lower-sodium products, often labeled as "lower sodium" or "no salt added."  Eat fresh foods.  Eat more vegetables, fruits, and low-fat dairy products.    Choose whole grains. Look for the word "whole" as the first word in the ingredient list.  Choose fish   Limit sweets, desserts, sugars, and sugary drinks.  Choose heart-healthy fats.  Eat veggie cheese   Eat more home-cooked food and less restaurant, buffet, and fast food.  Limit fried foods.  [place]uffaker foods using methods other than frying.  Limit canned vegetables. If you do use them, rinse them well to decrease the sodium.  When eating at a restaurant, ask that your food be prepared with less salt, or no salt if possible.                      WHAT FOODS CAN I EAT?  Read Dr Fara Olden Fuhrman's books on The End of Dieting & The End of Diabetes  Grains  Whole grain or whole wheat bread. Brown rice. Whole grain or whole wheat pasta. Quinoa, bulgur, and whole grain cereals. Low-sodium cereals. Corn or whole wheat flour tortillas. Whole grain cornbread. Whole grain crackers. Low-sodium crackers.  Vegetables  Fresh or frozen vegetables (raw, steamed, roasted, or grilled). Low-sodium or reduced-sodium tomato and vegetable juices. Low-sodium or reduced-sodium tomato sauce and paste. Low-sodium or reduced-sodium canned vegetables.   Fruits  All fresh, canned (in natural juice), or frozen fruits.  Protein Products   All fish and seafood.  Dried beans, peas, or lentils. Unsalted nuts and seeds. Unsalted canned beans.  Dairy  Low-fat dairy products, such as skim or 1% milk, 2% or reduced-fat cheeses, low-fat ricotta or cottage cheese, or plain low-fat yogurt. Low-sodium or reduced-sodium cheeses.  Fats and Oils  Tub margarines without trans fats. Light or  reduced-fat mayonnaise and salad dressings (reduced sodium). Avocado. Safflower, olive, or canola oils. Natural peanut or almond butter.  Other  Unsalted popcorn and pretzels. The items listed above may not be a complete list of recommended foods or beverages. Contact your dietitian for more options.  +++++++++++++++++++++++++++++++++++++++++++  WHAT FOODS ARE NOT RECOMMENDED?  Grains/ White flour or wheat flour  White bread. White pasta. White rice. Refined cornbread. Bagels and croissants. Crackers that contain trans fat.  Vegetables  Creamed or fried vegetables. Vegetables in a . Regular canned vegetables. Regular canned tomato sauce and paste. Regular tomato and vegetable juices.  Fruits  Dried fruits. Canned fruit in light or heavy syrup. Fruit juice.  Meat and Other Protein Products  Meat in general - RED mwaet & White meat.  Fatty cuts of meat. Ribs, chicken wings, bacon, sausage, bologna, salami, chitterlings, fatback, hot dogs, bratwurst, and packaged luncheon meats.  Dairy  Whole or 2% milk, cream, half-and-half, and cream cheese. Whole-fat or sweetened yogurt. Full-fat cheeses or blue cheese. Nondairy creamers and whipped toppings. Processed cheese, cheese spreads, or  cheese curds.  Condiments  Onion and garlic salt, seasoned salt, table salt, and sea salt. Canned and packaged gravies. Worcestershire sauce. Tartar sauce. Barbecue sauce. Teriyaki sauce. Soy sauce, including reduced sodium. Steak sauce. Fish sauce. Oyster sauce. Cocktail sauce. Horseradish. Ketchup and mustard. Meat flavorings and tenderizers. Bouillon cubes. Hot sauce. Tabasco sauce. Marinades. Taco seasonings. Relishes.  Fats and Oils Butter, stick margarine, lard, shortening and bacon fat. Coconut, palm kernel, or palm oils. Regular salad dressings.  Pickles and olives. Salted popcorn and pretzels.  The items listed above may not be a complete list of foods and beverages to avoid.   Preventive  Care for Adults  A healthy lifestyle and preventive care can promote health and wellness. Preventive health guidelines for women include the following key practices.  A routine yearly physical is a good way to check with your health care provider about your health and preventive screening. It is a chance to share any concerns and updates on your health and to receive a thorough exam.  Visit your dentist for a routine exam and preventive care every 6 months. Brush your teeth twice a day and floss once a day. Good oral hygiene prevents tooth decay and gum disease.  The frequency of eye exams is based on your age, health, family medical history, use of contact lenses, and other factors. Follow your health care provider's recommendations for frequency of eye exams.  Eat a healthy diet. Foods like vegetables, fruits, whole grains, low-fat dairy products, and lean protein foods contain the nutrients you need without too many calories. Decrease your intake of foods high in solid fats, added sugars, and salt. Eat the right amount of calories for you.Get information about a proper diet from your health care provider, if necessary.  Regular physical exercise is one of the most important things you can do for your health. Most adults should get at least 150 minutes of moderate-intensity exercise (any activity that increases your heart rate and causes you to sweat) each week. In addition, most adults need muscle-strengthening exercises on 2 or more days a week.  Maintain a healthy weight. The body mass index (BMI) is a screening tool to identify possible weight problems. It provides an estimate of body fat based on height and weight. Your health care provider can find your BMI and can help you achieve or maintain a healthy weight.For adults 20 years and older:  A BMI below 18.5 is considered underweight.  A BMI of 18.5 to 24.9 is normal.  A BMI of 25 to 29.9 is considered overweight.  A BMI of 30 and  above is considered obese.  Maintain normal blood lipids and cholesterol levels by exercising and minimizing your intake of saturated fat. Eat a balanced diet with plenty of fruit and vegetables. If your lipid or cholesterol levels are high, you are over 50, or you are at high risk for heart disease, you may need your cholesterol levels checked more frequently.Ongoing high lipid and cholesterol levels should be treated with medicines if diet and exercise are not working.  If you smoke, find out from your health care provider how to quit. If you do not use tobacco, do not start.  Lung cancer screening is recommended for adults aged 56-80 years who are at high risk for developing lung cancer because of a history of smoking. A yearly low-dose CT scan of the lungs is recommended for people who have at least a 30-pack-year history of smoking and are a current smoker or  have quit within the past 15 years. A pack year of smoking is smoking an average of 1 pack of cigarettes a day for 1 year (for example: 1 pack a day for 30 years or 2 packs a day for 15 years). Yearly screening should continue until the smoker has stopped smoking for at least 15 years. Yearly screening should be stopped for people who develop a health problem that would prevent them from having lung cancer treatment.  Avoid use of street drugs. Do not share needles with anyone. Ask for help if you need support or instructions about stopping the use of drugs.  High blood pressure causes heart disease and increases the risk of stroke.  Ongoing high blood pressure should be treated with medicines if weight loss and exercise do not work.  If you are 52-67 years old, ask your health care provider if you should take aspirin to prevent strokes.  Diabetes screening involves taking a blood sample to check your fasting blood sugar level. This should be done once every 3 years, after age 9, if you are within normal weight and without risk factors for  diabetes. Testing should be considered at a younger age or be carried out more frequently if you are overweight and have at least 1 risk factor for diabetes.  Breast cancer screening is essential preventive care for women. You should practice "breast self-awareness." This means understanding the normal appearance and feel of your breasts and may include breast self-examination. Any changes detected, no matter how small, should be reported to a health care provider. Women in their 45s and 30s should have a clinical breast exam (CBE) by a health care provider as part of a regular health exam every 1 to 3 years. After age 76, women should have a CBE every year. Starting at age 54, women should consider having a mammogram (breast X-ray test) every year. Women who have a family history of breast cancer should talk to their health care provider about genetic screening. Women at a high risk of breast cancer should talk to their health care providers about having an MRI and a mammogram every year.  Breast cancer gene (BRCA)-related cancer risk assessment is recommended for women who have family members with BRCA-related cancers. BRCA-related cancers include breast, ovarian, tubal, and peritoneal cancers. Having family members with these cancers may be associated with an increased risk for harmful changes (mutations) in the breast cancer genes BRCA1 and BRCA2. Results of the assessment will determine the need for genetic counseling and BRCA1 and BRCA2 testing.  Routine pelvic exams to screen for cancer are no longer recommended for nonpregnant women who are considered low risk for cancer of the pelvic organs (ovaries, uterus, and vagina) and who do not have symptoms. Ask your health care provider if a screening pelvic exam is right for you.  If you have had past treatment for cervical cancer or a condition that could lead to cancer, you need Pap tests and screening for cancer for at least 20 years after your  treatment. If Pap tests have been discontinued, your risk factors (such as having a new sexual partner) need to be reassessed to determine if screening should be resumed. Some women have medical problems that increase the chance of getting cervical cancer. In these cases, your health care provider may recommend more frequent screening and Pap tests.    Colorectal cancer can be detected and often prevented. Most routine colorectal cancer screening begins at the age of 73 years and  continues through age 75 years. However, your health care provider may recommend screening at an earlier age if you have risk factors for colon cancer. On a yearly basis, your health care provider may provide home test kits to check for hidden blood in the stool. Use of a small camera at the end of a tube, to directly examine the colon (sigmoidoscopy or colonoscopy), can detect the earliest forms of colorectal cancer. Talk to your health care provider about this at age 50, when routine screening begins. Direct exam of the colon should be repeated every 5-10 years through age 75 years, unless early forms of pre-cancerous polyps or small growths are found.  Osteoporosis is a disease in which the bones lose minerals and strength with aging. This can result in serious bone fractures or breaks. The risk of osteoporosis can be identified using a bone density scan. Women ages 65 years and over and women at risk for fractures or osteoporosis should discuss screening with their health care providers. Ask your health care provider whether you should take a calcium supplement or vitamin D to reduce the rate of osteoporosis.  Menopause can be associated with physical symptoms and risks. Hormone replacement therapy is available to decrease symptoms and risks. You should talk to your health care provider about whether hormone replacement therapy is right for you.  Use sunscreen. Apply sunscreen liberally and repeatedly throughout the day. You  should seek shade when your shadow is shorter than you. Protect yourself by wearing long sleeves, pants, a wide-brimmed hat, and sunglasses year round, whenever you are outdoors.  Once a month, do a whole body skin exam, using a mirror to look at the skin on your back. Tell your health care provider of new moles, moles that have irregular borders, moles that are larger than a pencil eraser, or moles that have changed in shape or color.  Stay current with required vaccines (immunizations).  Influenza vaccine. All adults should be immunized every year.  Tetanus, diphtheria, and acellular pertussis (Td, Tdap) vaccine. Pregnant women should receive 1 dose of Tdap vaccine during each pregnancy. The dose should be obtained regardless of the length of time since the last dose. Immunization is preferred during the 27th-36th week of gestation. An adult who has not previously received Tdap or who does not know her vaccine status should receive 1 dose of Tdap. This initial dose should be followed by tetanus and diphtheria toxoids (Td) booster doses every 10 years. Adults with an unknown or incomplete history of completing a 3-dose immunization series with Td-containing vaccines should begin or complete a primary immunization series including a Tdap dose. Adults should receive a Td booster every 10 years.    Zoster vaccine. One dose is recommended for adults aged 60 years or older unless certain conditions are present.    Pneumococcal 13-valent conjugate (PCV13) vaccine. When indicated, a person who is uncertain of her immunization history and has no record of immunization should receive the PCV13 vaccine. An adult aged 19 years or older who has certain medical conditions and has not been previously immunized should receive 1 dose of PCV13 vaccine. This PCV13 should be followed with a dose of pneumococcal polysaccharide (PPSV23) vaccine. The PPSV23 vaccine dose should be obtained at least 8 weeks after the dose  of PCV13 vaccine. An adult aged 19 years or older who has certain medical conditions and previously received 1 or more doses of PPSV23 vaccine should receive 1 dose of PCV13. The PCV13 vaccine dose should   be obtained 1 or more years after the last PPSV23 vaccine dose.    Pneumococcal polysaccharide (PPSV23) vaccine. When PCV13 is also indicated, PCV13 should be obtained first. All adults aged 65 years and older should be immunized. An adult younger than age 65 years who has certain medical conditions should be immunized. Any person who resides in a nursing home or long-term care facility should be immunized. An adult smoker should be immunized. People with an immunocompromised condition and certain other conditions should receive both PCV13 and PPSV23 vaccines. People with human immunodeficiency virus (HIV) infection should be immunized as soon as possible after diagnosis. Immunization during chemotherapy or radiation therapy should be avoided. Routine use of PPSV23 vaccine is not recommended for American Indians, [place] Natives, or people younger than 65 years unless there are medical conditions that require PPSV23 vaccine. When indicated, people who have unknown immunization and have no record of immunization should receive PPSV23 vaccine. One-time revaccination 5 years after the first dose of PPSV23 is recommended for people aged 19-64 years who have chronic kidney failure, nephrotic syndrome, asplenia, or immunocompromised conditions. People who received 1-2 doses of PPSV23 before age 65 years should receive another dose of PPSV23 vaccine at age 65 years or later if at least 5 years have passed since the previous dose. Doses of PPSV23 are not needed for people immunized with PPSV23 at or after age 65 years.   Preventive Services / Frequency  Ages 65 years and over  Blood pressure check.  Lipid and cholesterol check.  Lung cancer screening. / Every year if you are aged 55-80 years and have a  30-pack-year history of smoking and currently smoke or have quit within the past 15 years. Yearly screening is stopped once you have quit smoking for at least 15 years or develop a health problem that would prevent you from having lung cancer treatment.  Clinical breast exam.** / Every year after age 40 years.  BRCA-related cancer risk assessment.** / For women who have family members with a BRCA-related cancer (breast, ovarian, tubal, or peritoneal cancers).  Mammogram.** / Every year beginning at age 40 years and continuing for as long as you are in good health. Consult with your health care provider.  Pap test.** / Every 3 years starting at age 30 years through age 65 or 70 years with 3 consecutive normal Pap tests. Testing can be stopped between 65 and 70 years with 3 consecutive normal Pap tests and no abnormal Pap or HPV tests in the past 10 years.  Fecal occult blood test (FOBT) of stool. / Every year beginning at age 50 years and continuing until age 75 years. You may not need to do this test if you get a colonoscopy every 10 years.  Flexible sigmoidoscopy or colonoscopy.** / Every 5 years for a flexible sigmoidoscopy or every 10 years for a colonoscopy beginning at age 50 years and continuing until age 75 years.  Hepatitis C blood test.** / For all people born from 1945 through 1965 and any individual with known risks for hepatitis C.  Osteoporosis screening.** / A one-time screening for women ages 65 years and over and women at risk for fractures or osteoporosis.  Skin self-exam. / Monthly.  Influenza vaccine. / Every year.  Tetanus, diphtheria, and acellular pertussis (Tdap/Td) vaccine.** / 1 dose of Td every 10 years.  Zoster vaccine.** / 1 dose for adults aged 60 years or older.  Pneumococcal 13-valent conjugate (PCV13) vaccine.** / Consult your health care provider.    Pneumococcal polysaccharide (PPSV23) vaccine.** / 1 dose for all adults aged 65 years and older. Screening  for abdominal aortic aneurysm (AAA)  by ultrasound is recommended for people who have history of high blood pressure or who are current or former smokers. 

## 2015-09-20 NOTE — Progress Notes (Signed)
Patient ID: Debra White, female   DOB: 1934/02/07, 82 y.o.   MRN: [id number]  Annual Screening/Preventative Visit And Comprehensive Evaluation &  Examination  This very nice 82 y.o. MWF presents for a Wellness/Preventative Visit & comprehensive evaluation and management of multiple medical co-morbidities.  Patient has been followed for HTN, Prediabetes, Hyperlipidemia and Vitamin D Deficiency. In Jan 2013 after a fall from a stepstool she experienced a R orbital fx and subsequently developed Chronic R facial pain which has only slowly improved over time with Gabapentin. She did experience about a 30# weight loss due to difficulty eating.     HTN predates since 50. Patient's BP has been controlled at home and patient denies any cardiac symptoms as chest pain, palpitations, shortness of breath, dizziness or ankle swelling. Today's BP: 120/62 mmHg    Patient's hyperlipidemia is controlled with diet and medications. Patient denies myalgias or other medication SE's. Last lipids were at goal with Cholesterol 154; HDL 62; LDL 56; Triglycerides 178 on 06/09/2015.   Patient has prediabetes predating since 2011 with A1c 6.8% in the Diabetic range, but then after a 30# weight loss her A1c dropped to 5.8% by 2013. Patient denies reactive hypoglycemic symptoms, visual blurring, diabetic polys, or paresthesias. Last A1c was  5.3% on12/15/2016.   Finally, patient has history of Vitamin D Deficiency and last Vitamin D was  64 on 06/09/2015.    Medication Sig  . atenolol  100 MG tablet TAKE 1 TABLET BY MOUTH EVERY MORNING (Patient taking differently: TAKE 0.5 TABLET BY MOUTH EVERY MORNING)  . VITAMIN D 1000 UNITS  Take 5,000 Units by mouth daily.   . clonazePAM  1 MG tablet TAKE 1/2-1 TABLET BY MOUTH UP TO 3 TIMESDAILY IF NEEDED FOR NERVES  . diclofenac(VOLTAREN) 1 % GEL Apply 4 g topically 4 (four) times daily.  Marland Kitchen dicyclomine20 MG tablet Take 1 tablet 3 x day before meals for bladder pains (Patient not taking:  Reported on 09/20/2015)  . gabapentin  300 MG capsule TAKE 1 CAPSULE BY MOUTH 3-4 TIMES DAILY FOR FACIAL PAIN  . Hyoscyamine-SL 0.125 MG  Dissolve one tablet by mouth every 4-6 hours as needed  . losartan  100 MG tablet TAKE 1/2 TABLET BY MOUTH DAILY  . pravastatin ( 40 MG tablet Take 1 tablet (40 mg total) by mouth daily. (Patient taking differently: Take 20 mg by mouth daily. )  . Probiotic FLORAJEN3  Take 1 tablet by mouth daily.   . ranitidine (ZANTAC) 300 MG tablet Take 1 tablet 1 to 2 x day as needed for acid indigestion & redlux  . traMADol 50 MG tablet TAKE 1 TABLET BY MOUTH 4 TIMES DAILY AS NEEDED FOR PAIN   Allergies  Allergen Reactions  . Latex Hives    TONGUE HAD BLISTERS WHEN HAD DENTAL SURGERY  . Amoxicillin Nausea And Vomiting  . Chlorhexidine Gluconate Itching  . Aspirin     Gi upset  . Barbiturates   . Celebrex [Celecoxib]   . Codeine Itching  . Evista [Raloxifene]   . Lipitor [Atorvastatin]   . Morphine And Related   . Oruvail [Ketoprofen]   . Paraffin   . Prochlorperazine Other (See Comments)    Uncontrolled shaking  . Proloprim [Trimethoprim]   . Vibra-Tab [Doxycycline]   . Vioxx [Rofecoxib]     edema  . Betadine [Povidone Iodine] Rash  . Caffeine Palpitations   Past Medical History  Diagnosis Date  . Fibromyalgia   . Anxiety   . Dysrhythmia  RX  . Blood transfusion   . Chronic kidney disease     STONES  . Headache(784.0)   . Arthritis   . Cataract   . Diverticulosis of colon (without mention of hemorrhage) 2007    Colonoscopy  . Candida esophagitis ([place]) 2013    EGD  . Family history of colon cancer     sister  . Hypertension   . Hyperlipemia   . GERD (gastroesophageal reflux disease)   . Emphysema of lung ([place])   . Kidney stones    Health Maintenance  Topic Date Due  . PNA vac Low Risk Adult (2 of 2 - PCV13) 06/26/2003  . TETANUS/TDAP  04/05/2016 (Originally 06/25/2013)  . ZOSTAVAX  04/11/2017 (Originally 12/30/1995)  . INFLUENZA  VACCINE  01/24/2016  . COLONOSCOPY  03/17/2017  . DEXA SCAN  Completed   Immunization History  Administered Date(s) Administered  . Influenza, High Dose Seasonal PF 03/25/2014  . Influenza-Unspecified 03/09/2013, 03/11/2015  . Pneumococcal Polysaccharide-23 06/25/2002  . Td 06/26/2003   Past Surgical History  Procedure Laterality Date  . Abdominal hysterectomy    . Hand surgery      BIL   . Shoulder arthroscopy w/ rotator cuff repair      LFT  . Cervical disc surgery    . Back surgery      X2  . Orif tripod fracture  07/13/2011    Procedure: OPEN REDUCTION INTERNAL FIXATION (ORIF) TRIPOD FRACTURE;  Surgeon: Tyson Alias, MD;  Location: [facility];  Service: ENT;  Laterality: Right;  ORIF RIGHT ZYGOMA, ORBITAL FLOOR EXPLORATION WITH FROST STITCH (TEMPORARY TARSORRHAPHY)  . Colonoscopy    . Facial fracture surgery    . Back surgery     Family History  Problem Relation Age of Onset  . Heart disease Mother   . Breast cancer Sister   . Colon cancer Sister   . Cancer Sister     breast  . Cancer Sister     colon  . Cancer Sister     colon  . Heart disease Father   . Hypertension Daughter   . Hyperlipidemia Daughter    Social History  Substance Use Topics  . Smoking status: Never Smoker   . Smokeless tobacco: Never Used  . Alcohol Use: No    ROS Constitutional: Denies fever, chills, weight loss/gain, headaches, insomnia,  night sweats, and change in appetite. Does c/o fatigue. Eyes: Denies redness, blurred vision, diplopia, discharge, itchy, watery eyes.  ENT: Denies discharge, congestion, post nasal drip, epistaxis, sore throat, earache, hearing loss, dental pain, Tinnitus, Vertigo, Sinus pain, snoring.  Cardio: Denies chest pain, palpitations, irregular heartbeat, syncope, dyspnea, diaphoresis, orthopnea, PND, claudication, edema Respiratory: denies cough, dyspnea, DOE, pleurisy, hoarseness, laryngitis, wheezing.  Gastrointestinal: Denies dysphagia, heartburn, reflux,  water brash, pain, cramps, nausea, vomiting, bloating, diarrhea, constipation, hematemesis, melena, hematochezia, jaundice, hemorrhoids Genitourinary: Denies dysuria, frequency, urgency, nocturia, hesitancy, discharge, hematuria, flank pain Breast: Breast lumps, nipple discharge, bleeding.  Musculoskeletal: Denies arthralgia, myalgia, stiffness, Jt. Swelling, pain, limp, and strain/sprain. Denies falls. Skin: Denies puritis, rash, hives, warts, acne, eczema, changing in skin lesion Neuro: No weakness, tremor, incoordination, spasms, paresthesia, pain Psychiatric: Denies confusion, memory loss, sensory loss. Denies Depression. Endocrine: Denies change in weight, skin, hair change, nocturia, and paresthesia, diabetic polys, visual blurring, hyper / hypo glycemic episodes.  Heme/Lymph: No excessive bleeding, bruising, enlarged lymph nodes.  Physical Exam  BP 120/62 mmHg  Pulse 60  Temp(Src) 97.5 F (36.4 C)  Resp 16  Ht  5\' 4"  (1.626 m)  Wt 110 lb 12.8 oz (50.259 kg)  BMI 19.01 kg/m2  General Appearance: Well nourished and in no apparent distress.  Eyes: PERRLA, EOMs, conjunctiva no swelling or erythema, normal fundi and vessels. Sinuses: No frontal/maxillary tenderness ENT/Mouth: EACs patent / TMs  nl. Nares clear without erythema, swelling, mucoid exudates. Oral hygiene is good. No erythema, swelling, or exudate. Tongue normal, non-obstructing. Tonsils not swollen or erythematous. Hearing normal.  Neck: Supple, thyroid normal. No bruits, nodes or JVD. Respiratory: Respiratory effort normal.  BS equal and clear bilateral without rales, rhonci, wheezing or stridor. Cardio: Heart sounds are normal with regular rate and rhythm and no murmurs, rubs or gallops. Peripheral pulses are normal and equal bilaterally without edema. No aortic or femoral bruits. Chest: symmetric with normal excursions and percussion. Breasts: Symmetric, without lumps, nipple discharge, retractions, or fibrocystic  changes.  Abdomen: Flat, soft, with bowel sounds. Nontender, no guarding, rebound, hernias, masses, or organomegaly.  Lymphatics: Non tender without lymphadenopathy.  Musculoskeletal: Full ROM all peripheral extremities, joint stability, 5/5 strength, and normal gait. Skin: Warm and dry without rashes, lesions, cyanosis, clubbing or  ecchymosis.  Neuro: Cranial nerves intact, reflexes equal bilaterally. Normal muscle tone, no cerebellar symptoms. Sensation intact.  Pysch: Alert and oriented X 3, normal affect, Insight and Judgment appropriate.   Assessment and Plan  1. Annual Preventative Screening Examination  - Microalbumin / creatinine urine ratio - EKG 12-Lead - POC Hemoccult Bld/Stl  - Urinalysis, Routine w reflex microscopic  - CBC with Differential/Platelet - BASIC METABOLIC PANEL WITH GFR - Hepatic function panel - Magnesium - Lipid panel - TSH - Hemoglobin A1c - Insulin, random - VITAMIN D 25 Hydroxy   2. Essential hypertension  - Microalbumin / creatinine urine ratio - EKG 12-Lead - TSH  3. Hyperlipemia  - Lipid panel - TSH  4. Prediabetes  - Hemoglobin A1c - Insulin, random  5. Vitamin D deficiency  - VITAMIN D 25 Hydroxy   6. Gastroesophageal reflux disease   7. BMI less than 19,adult   8. Atypical facial pain   9. Medication management  - Urinalysis, Routine w reflex microscopic  - CBC with Differential/Platelet - BASIC METABOLIC PANEL WITH GFR - Hepatic function panel - Magnesium  10. Screening for rectal cancer  - POC Hemoccult Bld/Stl    Continue prudent diet as discussed, weight control, BP monitoring, regular exercise, and medications. Discussed med's effects and SE's. Screening labs and tests as requested with regular follow-up as recommended. Over 40 minutes of exam, counseling, chart review and high complex critical decision making was performed.

## 2015-09-21 LAB — URINALYSIS, ROUTINE W REFLEX MICROSCOPIC
BILIRUBIN URINE: NEGATIVE
Glucose, UA: NEGATIVE
HGB URINE DIPSTICK: NEGATIVE
KETONES UR: NEGATIVE
NITRITE: NEGATIVE
PROTEIN: NEGATIVE
SPECIFIC GRAVITY, URINE: 1.009 (ref 1.001–1.035)
pH: 6.5 (ref 5.0–8.0)

## 2015-09-21 LAB — HEMOGLOBIN A1C
Hgb A1c MFr Bld: 5.8 % — ABNORMAL HIGH (ref <5.7)
MEAN PLASMA GLUCOSE: 120 mg/dL

## 2015-09-21 LAB — MICROALBUMIN / CREATININE URINE RATIO
Creatinine, Urine: 43 mg/dL (ref 20–320)
Microalb Creat Ratio: 12 mcg/mg creat (ref <30)
Microalb, Ur: 0.5 mg/dL

## 2015-09-21 LAB — URINALYSIS, MICROSCOPIC ONLY
CASTS: NONE SEEN [LPF]
CRYSTALS: NONE SEEN [HPF]
RBC / HPF: NONE SEEN RBC/HPF (ref <=2)
Squamous Epithelial / LPF: NONE SEEN [HPF] (ref <=5)
WBC, UA: NONE SEEN WBC/HPF (ref <=5)
YEAST: NONE SEEN [HPF]

## 2015-09-21 LAB — INSULIN, RANDOM: INSULIN: 3.6 u[IU]/mL (ref 2.0–19.6)

## 2015-09-21 LAB — VITAMIN D 25 HYDROXY (VIT D DEFICIENCY, FRACTURES): Vit D, 25-Hydroxy: 65 ng/mL (ref 30–100)

## 2015-09-27 DIAGNOSIS — Z0101 Encounter for examination of eyes and vision with abnormal findings: Secondary | ICD-10-CM | POA: Diagnosis not present

## 2015-10-11 DIAGNOSIS — R079 Chest pain, unspecified: Secondary | ICD-10-CM | POA: Diagnosis not present

## 2015-10-11 DIAGNOSIS — M47817 Spondylosis without myelopathy or radiculopathy, lumbosacral region: Secondary | ICD-10-CM | POA: Diagnosis not present

## 2015-10-21 ENCOUNTER — Other Ambulatory Visit: Payer: Self-pay | Admitting: Orthopaedic Surgery

## 2015-10-21 DIAGNOSIS — M47816 Spondylosis without myelopathy or radiculopathy, lumbar region: Secondary | ICD-10-CM

## 2015-10-24 ENCOUNTER — Other Ambulatory Visit: Payer: Self-pay | Admitting: Internal Medicine

## 2015-10-24 ENCOUNTER — Other Ambulatory Visit: Payer: Self-pay | Admitting: *Deleted

## 2015-10-24 MED ORDER — TRAMADOL HCL 50 MG PO TABS: ORAL_TABLET | ORAL | Status: DC

## 2015-10-25 ENCOUNTER — Other Ambulatory Visit: Payer: Self-pay | Admitting: *Deleted

## 2015-10-25 DIAGNOSIS — Z0001 Encounter for general adult medical examination with abnormal findings: Secondary | ICD-10-CM

## 2015-10-25 DIAGNOSIS — Z1212 Encounter for screening for malignant neoplasm of rectum: Secondary | ICD-10-CM

## 2015-10-25 LAB — POC HEMOCCULT BLD/STL (HOME/3-CARD/SCREEN)
Card #3 Fecal Occult Blood, POC: NEGATIVE
FECAL OCCULT BLD: NEGATIVE
Fecal Occult Blood, POC: NEGATIVE

## 2015-10-28 ENCOUNTER — Ambulatory Visit
Admission: RE | Admit: 2015-10-28 | Discharge: 2015-10-28 | Disposition: A | Discharge status: Home / Self Care | Payer: PPO | Source: Ambulatory Visit | Attending: Orthopaedic Surgery | Admitting: Orthopaedic Surgery

## 2015-10-28 DIAGNOSIS — M5136 Other intervertebral disc degeneration, lumbar region: Secondary | ICD-10-CM | POA: Diagnosis not present

## 2015-10-28 DIAGNOSIS — M47816 Spondylosis without myelopathy or radiculopathy, lumbar region: Secondary | ICD-10-CM

## 2015-11-01 DIAGNOSIS — M7541 Impingement syndrome of right shoulder: Secondary | ICD-10-CM | POA: Diagnosis not present

## 2015-11-01 DIAGNOSIS — M7631 Iliotibial band syndrome, right leg: Secondary | ICD-10-CM | POA: Diagnosis not present

## 2015-11-10 ENCOUNTER — Ambulatory Visit (INDEPENDENT_AMBULATORY_CARE_PROVIDER_SITE_OTHER): Payer: PPO | Admitting: Internal Medicine

## 2015-11-10 ENCOUNTER — Encounter: Payer: Self-pay | Admitting: Internal Medicine

## 2015-11-10 VITALS — BP 126/78 | HR 60 | Temp 97.0°F | Resp 16 | Ht 64.0 in | Wt 110.6 lb

## 2015-11-10 DIAGNOSIS — K5732 Diverticulitis of large intestine without perforation or abscess without bleeding: Secondary | ICD-10-CM | POA: Diagnosis not present

## 2015-11-10 MED ORDER — CIPROFLOXACIN HCL 500 MG PO TABS: ORAL_TABLET | ORAL | Status: AC

## 2015-11-10 MED ORDER — METRONIDAZOLE 250 MG PO TABS: ORAL_TABLET | ORAL | Status: AC

## 2015-11-10 NOTE — Progress Notes (Signed)
Subjective:    Patient ID: Debra White, female    DOB: May 24, 1934, 82 y.o.   MRN: [id number]  HPI  This very nic e 82 yo MWF with prior hx/o diverticulitis & hospitalization in 2014 wit a diverticular abscess presents with intermittent N and LLQ cramping pains. Denies fevers, chills, sweats, or rashes.   Medication Sig  . atenolol (TENORMIN) 100 MG tablet TAKE 1 TABLET BY MOUTH EVERY MORNING- takes 1/2   . VITAMIN D 1000 UNITS tablet Take 5,000 Units by mouth daily.   . clonazePAM (KLONOPIN) 1 MG tablet TAKE 1/2-1 TABLET BY MOUTH UP TO 3 TIMESDAILY IF NEEDED FOR NERVES  . dicyclomine (BENTYL) 20 MG tablet Take 1 tablet 3 x day before meals for bladder pains  . gabapentin  300 MG capsule TAKE 1 CAPSULE BY MOUTH 3-4 TIMES DAILY FOR FACIAL PAIN  . hyoscyamine  SL 0.125 MG SL tablet Dissolve one tablet by mouth every 4-6 hours as needed  . losartan (COZAAR) 100 MG tablet TAKE 1/2 TABLET BY MOUTH DAILY  . pravastatin (PRAVACHOL) 40 MG tablet Take 1 tablet (40 mg total) by mouth daily - takes 1/2 tab  . Probiotic Product (FLORAJEN3 PO) Take 1 tablet by mouth daily.   . traMADol (ULTRAM) 50 MG tablet TAKE 1 TABLET BY MOUTH 4 TIMES DAILY AS NEEDED FOR PAIN  . ranitidine (ZANTAC) 300 MG tablet Take 1 tablet 1 to 2 x day as needed for acid indigestion & redlux   Allergies  Allergen Reactions  . Latex Hives    TONGUE HAD BLISTERS WHEN HAD DENTAL SURGERY  . Amoxicillin Nausea And Vomiting  . Chlorhexidine Gluconate Itching  . Aspirin     Gi upset  . Barbiturates   . Celebrex [Celecoxib]   . Codeine Itching  . Evista [Raloxifene]   . Lipitor [Atorvastatin]   . Morphine And Related   . Oruvail [Ketoprofen]   . Paraffin   . Prochlorperazine Other (See Comments)    Uncontrolled shaking  . Proloprim [Trimethoprim]   . Vibra-Tab [Doxycycline]   . Vioxx [Rofecoxib]     edema  . Betadine [Povidone Iodine] Rash  . Caffeine Palpitations   Past Medical History  Diagnosis Date  . Fibromyalgia    . Anxiety   . Dysrhythmia     RX  . Blood transfusion   . Chronic kidney disease     STONES  . Headache(784.0)   . Arthritis   . Cataract   . Diverticulosis of colon (without mention of hemorrhage) 2007    Colonoscopy  . Candida esophagitis ([employer]) 2013    EGD  . Family history of colon cancer     sister  . Hypertension   . Hyperlipemia   . GERD (gastroesophageal reflux disease)   . Emphysema of lung ([place])   . Kidney stones    Review of Systems  10 point systems review negative except as above.    Objective:   Physical Exam  BP 126/78 mmHg  Pulse 60  Temp(Src) 97 F (36.1 C)  Resp 16  Ht 5\' 4"  (1.626 m)  Wt 110 lb 9.6 oz (50.168 kg)  BMI 18.98 kg/m2  HEENT - Eac's patent. TM's Nl. EOM's full. PERRLA. NasoOroPharynx clear. Neck - supple. Nl Thyroid. Carotids 2+ & No bruits, nodes, JVD Chest - Clear equal BS w/o Rales, rhonchi, wheezes. Cor - Nl HS. RRR w/o sig MGR. PP 1(+). No edema. Abd - Spoft with tender LLQ and al guarding and no rebound.  MS- FROM w/o deformities. Muscle power, tone and bulk Nl. Gait Nl. Neuro - No obvious Cr N abnormalities. Sensory, motor and Cerebellar functions appear Nl w/o focal abnormalities. Psyche - Mental status normal & appropriate.  No delusions, ideations or obvious mood abnormalities.    Assessment & Plan:   1. Diverticulitis of colon  - diet discussed, discussed meds & SE's, ROV prn  - ciprofloxacin (CIPRO) 500 MG tablet; Take 1 tablet 2 x/day with food for infection  Dispense: 20 tablet; Refill: 1 - metroNIDAZOLE (FLAGYL) 250 MG tablet; Take 1 tablet 3 x/day with food for infection  Dispense: 30 tablet; Refill: 1

## 2015-11-10 NOTE — Patient Instructions (Signed)
Diverticulitis °Diverticulitis is inflammation or infection of small pouches in your colon that form when you have a condition called diverticulosis. The pouches in your colon are called diverticula. Your colon, or large intestine, is where water is absorbed and stool is formed. °Complications of diverticulitis can include: °· Bleeding. °· Severe infection. °· Severe pain. °· Perforation of your colon. °· Obstruction of your colon. °CAUSES  °Diverticulitis is caused by bacteria. °Diverticulitis happens when stool becomes trapped in diverticula. This allows bacteria to grow in the diverticula, which can lead to inflammation and infection. °RISK FACTORS °People with diverticulosis are at risk for diverticulitis. Eating a diet that does not include enough fiber from fruits and vegetables may make diverticulitis more likely to develop. °SYMPTOMS  °Symptoms of diverticulitis may include: °· Abdominal pain and tenderness. The pain is normally located on the left side of the abdomen, but may occur in other areas. °· Fever and chills. °· Bloating. °· Cramping. °· Nausea. °· Vomiting. °· Constipation. °· Diarrhea. °· Blood in your stool. °DIAGNOSIS  °Your health care provider will ask you about your medical history and do a physical exam. You may need to have tests done because many medical conditions can cause the same symptoms as diverticulitis. Tests may include: °· Blood tests. °· Urine tests. °· Imaging tests of the abdomen, including X-rays and CT scans. °When your condition is under control, your health care provider may recommend that you have a colonoscopy. A colonoscopy can show how severe your diverticula are and whether something else is causing your symptoms. °TREATMENT  °Most cases of diverticulitis are mild and can be treated at home. Treatment may include: °· Taking over-the-counter pain medicines. °· Following a clear liquid diet. °· Taking antibiotic medicines by mouth for 7-10 days. °More severe cases may  be treated at a hospital. Treatment may include: °· Not eating or drinking. °· Taking prescription pain medicine. °· Receiving antibiotic medicines through an IV tube. °· Receiving fluids and nutrition through an IV tube. °· Surgery. °HOME CARE INSTRUCTIONS  °· Follow your health care provider's instructions carefully. °· Follow a full liquid diet or other diet as directed by your health care provider. After your symptoms improve, your health care provider may tell you to change your diet. He or she may recommend you eat a high-fiber diet. Fruits and vegetables are good sources of fiber. Fiber makes it easier to pass stool. °· Take fiber supplements or probiotics as directed by your health care provider. °· Only take medicines as directed by your health care provider. °· Keep all your follow-up appointments. °SEEK MEDICAL CARE IF:  °· Your pain does not improve. °· You have a hard time eating food. °· Your bowel movements do not return to normal. °SEEK IMMEDIATE MEDICAL CARE IF:  °· Your pain becomes worse. °· Your symptoms do not get better. °· Your symptoms suddenly get worse. °· You have a fever. °· You have repeated vomiting. °· You have bloody or black, tarry stools. °MAKE SURE YOU:  °· Understand these instructions. °· Will watch your condition. °· Will get help right away if you are not doing well or get worse. °  °This information is not intended to replace advice given to you by your health care provider. Make sure you discuss any questions you have with your health care provider. °  °Document Released: 03/21/2005 Document Revised: 06/16/2013 Document Reviewed: 05/06/2013 °[employer] Interactive Patient Education ©2016 [employer] ° °

## 2015-11-23 ENCOUNTER — Other Ambulatory Visit: Payer: Self-pay | Admitting: Internal Medicine

## 2015-11-28 ENCOUNTER — Other Ambulatory Visit: Payer: Self-pay | Admitting: Internal Medicine

## 2015-11-30 ENCOUNTER — Other Ambulatory Visit: Payer: Self-pay | Admitting: *Deleted

## 2015-11-30 DIAGNOSIS — M810 Age-related osteoporosis without current pathological fracture: Secondary | ICD-10-CM

## 2015-12-21 ENCOUNTER — Ambulatory Visit (INDEPENDENT_AMBULATORY_CARE_PROVIDER_SITE_OTHER): Payer: PPO | Admitting: Physician Assistant

## 2015-12-21 ENCOUNTER — Encounter: Payer: Self-pay | Admitting: Physician Assistant

## 2015-12-21 VITALS — BP 118/60 | HR 91 | Temp 97.5°F | Resp 14 | Ht 64.0 in | Wt 111.7 lb

## 2015-12-21 DIAGNOSIS — Z0000 Encounter for general adult medical examination without abnormal findings: Secondary | ICD-10-CM | POA: Diagnosis not present

## 2015-12-21 DIAGNOSIS — I10 Essential (primary) hypertension: Secondary | ICD-10-CM

## 2015-12-21 DIAGNOSIS — S02402S Zygomatic fracture, unspecified, sequela: Secondary | ICD-10-CM

## 2015-12-21 DIAGNOSIS — G44329 Chronic post-traumatic headache, not intractable: Secondary | ICD-10-CM

## 2015-12-21 DIAGNOSIS — I771 Stricture of artery: Secondary | ICD-10-CM

## 2015-12-21 DIAGNOSIS — R7303 Prediabetes: Secondary | ICD-10-CM

## 2015-12-21 DIAGNOSIS — E785 Hyperlipidemia, unspecified: Secondary | ICD-10-CM

## 2015-12-21 DIAGNOSIS — J439 Emphysema, unspecified: Secondary | ICD-10-CM

## 2015-12-21 DIAGNOSIS — E559 Vitamin D deficiency, unspecified: Secondary | ICD-10-CM

## 2015-12-21 DIAGNOSIS — H6123 Impacted cerumen, bilateral: Secondary | ICD-10-CM

## 2015-12-21 DIAGNOSIS — E441 Mild protein-calorie malnutrition: Secondary | ICD-10-CM | POA: Diagnosis not present

## 2015-12-21 DIAGNOSIS — Z23 Encounter for immunization: Secondary | ICD-10-CM

## 2015-12-21 DIAGNOSIS — K219 Gastro-esophageal reflux disease without esophagitis: Secondary | ICD-10-CM | POA: Diagnosis not present

## 2015-12-21 DIAGNOSIS — Z79899 Other long term (current) drug therapy: Secondary | ICD-10-CM

## 2015-12-21 DIAGNOSIS — M810 Age-related osteoporosis without current pathological fracture: Secondary | ICD-10-CM

## 2015-12-21 DIAGNOSIS — G501 Atypical facial pain: Secondary | ICD-10-CM

## 2015-12-21 LAB — HEPATIC FUNCTION PANEL
ALT: 14 U/L (ref 6–29)
AST: 20 U/L (ref 10–35)
Albumin: 4.4 g/dL (ref 3.6–5.1)
Alkaline Phosphatase: 67 U/L (ref 33–130)
BILIRUBIN DIRECT: 0.1 mg/dL (ref <=0.2)
BILIRUBIN TOTAL: 0.5 mg/dL (ref 0.2–1.2)
Indirect Bilirubin: 0.4 mg/dL (ref 0.2–1.2)
Total Protein: 7.4 g/dL (ref 6.1–8.1)

## 2015-12-21 LAB — BASIC METABOLIC PANEL WITH GFR
BUN: 14 mg/dL (ref 7–25)
CALCIUM: 9.4 mg/dL (ref 8.6–10.4)
CO2: 29 mmol/L (ref 20–31)
CREATININE: 0.76 mg/dL (ref 0.60–0.93)
Chloride: 98 mmol/L (ref 98–110)
GFR, Est African American: 86 mL/min (ref >=60)
GFR, Est Non African American: 75 mL/min (ref >=60)
Glucose, Bld: 88 mg/dL (ref 65–99)
Potassium: 4.4 mmol/L (ref 3.5–5.3)
SODIUM: 137 mmol/L (ref 135–146)

## 2015-12-21 LAB — CBC WITH DIFFERENTIAL/PLATELET
BASOS ABS: 0 {cells}/uL (ref 0–200)
Basophils Relative: 0 %
EOS PCT: 2 %
Eosinophils Absolute: 152 cells/uL (ref 15–500)
HCT: 38.7 % (ref 35.0–45.0)
Hemoglobin: 13.2 g/dL (ref 11.7–15.5)
LYMPHS ABS: 1824 {cells}/uL (ref 850–3900)
Lymphocytes Relative: 24 %
MCH: 31.4 pg (ref 27.0–33.0)
MCHC: 34.1 g/dL (ref 32.0–36.0)
MCV: 92.1 fL (ref 80.0–100.0)
MONO ABS: 760 {cells}/uL (ref 200–950)
MONOS PCT: 10 %
MPV: 10.2 fL (ref 7.5–12.5)
Neutro Abs: 4864 cells/uL (ref 1500–7800)
Neutrophils Relative %: 64 %
PLATELETS: 244 10*3/uL (ref 140–400)
RBC: 4.2 MIL/uL (ref 3.80–5.10)
RDW: 12.7 % (ref 11.0–15.0)
WBC: 7.6 10*3/uL (ref 3.8–10.8)

## 2015-12-21 LAB — LIPID PANEL
Cholesterol: 172 mg/dL (ref 125–200)
HDL: 80 mg/dL (ref >=46)
LDL Cholesterol: 67 mg/dL (ref <130)
Total CHOL/HDL Ratio: 2.2 Ratio (ref <=5.0)
Triglycerides: 123 mg/dL (ref <150)
VLDL: 25 mg/dL (ref <30)

## 2015-12-21 LAB — TSH: TSH: 0.77 mIU/L

## 2015-12-21 LAB — MAGNESIUM: Magnesium: 2 mg/dL (ref 1.5–2.5)

## 2015-12-21 MED ORDER — TRAMADOL HCL 50 MG PO TABS: ORAL_TABLET | ORAL | Status: DC

## 2015-12-21 NOTE — Progress Notes (Signed)
Patient ID: Debra White, female   DOB: Feb 04, 1934, 82 y.o.   MRN: [id number]  MEDICARE ANNUAL WELLNESS VISIT AND OV  Assessment:   1. Essential hypertension - continue medications, DASH diet, exercise and monitor at home. Call if greater than 130/80.  - CBC with Differential/Platelet - BASIC METABOLIC PANEL WITH GFR - Hepatic function panel - TSH  2. Hyperlipidemia -continue medications, check lipids, decrease fatty foods, increase activity.  - Lipid panel  3. Mild malnutrition ([place]) Ensure and glucerna samples given, continue weight gain  4. Prediabetes Discussed general issues about diabetes pathophysiology and management., Educational material distributed., Suggested low cholesterol diet., Encouraged aerobic exercise., Discussed foot care., Reminded to get yearly retinal exam. - Hemoglobin A1c  5. Vitamin D deficiency - VITAMIN D 25 Hydroxy (Vit-D Deficiency, Fractures)  6. Medication management - Magnesium  7. Chronic post-traumatic headache, not intractable Continue follow up neuro - traMADol (ULTRAM) 50 MG tablet; TAKE 1 TABLET BY MOUTH 4 TIMES DAILY AS NEEDED FOR PAIN  Dispense: 120 tablet; Refill: 1  8. Atypical facial pain Continue follow up neuro - traMADol (ULTRAM) 50 MG tablet; TAKE 1 TABLET BY MOUTH 4 TIMES DAILY AS NEEDED FOR PAIN  Dispense: 120 tablet; Refill: 1  9. Fracture, zygoma closed, sequela (HCC) - traMADol (ULTRAM) 50 MG tablet; TAKE 1 TABLET BY MOUTH 4 TIMES DAILY AS NEEDED FOR PAIN  Dispense: 120 tablet; Refill: 1  10. Gastroesophageal reflux disease, esophagitis presence not specified Continue PPI/H2 blocker, diet discussed  11. Encounter for Medicare annual wellness exam Declines TDAP  12. Pulmonary emphysema, unspecified emphysema type ([place]) No exacerbations, no SOB, continue to monitor  13. Tortuous aorta (HCC) Control blood pressure, cholesterol, glucose, increase exercise.   14. Osteoporosis Repeat dexa this year, declines  treatment at this time  15. Need for pneumococcal vaccination - Pneumococcal conjugate vaccine 13-valent IM  16. Bilateral impacted cerumen Follow up audiology for cleaning/replacement hearing aids.    Plan:   During the course of the visit the patient was educated and counseled about appropriate screening and preventive services including:    Pneumococcal vaccine   Influenza vaccine  Td vaccine  Screening electrocardiogram  Bone densitometry screening  Colorectal cancer screening  Diabetes screening  Glaucoma screening  Nutrition counseling   Advanced directives: requested   Subjective:   Debra White  presents for Medicare Annual Wellness Visit and complete physical.  Date of last medicare wellness visit was 10/21/2013.  This very nice 82 y.o. MWF presents for 3 month follow up with Hypertension, Hyperlipidemia, Pre-Diabetes and Vitamin D Deficiency.   Patient fell Jan 2013 and sustained a right orbital fx and was seen by Dr Erie Noe, now follows with Dr. Delice Lesch. Patient has atypical right facial pain and has had Neuro consultation for chronic atypical right facial pain. She relates poor sense of smell & taste and consequent poor oral intake and loss of weight. Takes klonopin for pain and she is on gabapentin.  BMI is Body mass index is 19.16 kg/(m^2)., she is working oo increasing weight, drinks ensure Wt Readings from Last 3 Encounters:  12/21/15 111 lb 11.2 oz (50.667 kg)  11/10/15 110 lb 9.6 oz (50.168 kg)  09/20/15 110 lb 12.8 oz (50.259 kg)    She also is being followed by Dr Sharol Given for chronic DDD/LBP and has been advised she does not have a surgical problem and she has had epidural steroid injection. She complains of right shoulder pain, has been see Dr. Durward Fortes. Uses dicolfenac for  that and she will take tramadol 2 x a day.   Patient is treated for HTN & BP has been controlled at home. Today's BP: 118/60 mmHg. Patient has had no complaints of any  cardiac type chest pain, palpitations, dyspnea/orthopnea/PND, dizziness, claudication, or dependent edema.  Hyperlipidemia is controlled with diet & meds. Patient denies myalgias or other med SE's.  Lab Results  Component Value Date   CHOL 170 09/20/2015   HDL 68 09/20/2015   LDLCALC 67 09/20/2015   TRIG 177* 09/20/2015   CHOLHDL 2.5 09/20/2015    Also, the patient has history of PreDiabetes since 2011 with A1c 6.5% and has had no symptoms of reactive hypoglycemia, diabetic polys, paresthesias or visual blurring.  Lab Results  Component Value Date   HGBA1C 5.8* 09/20/2015   Further, the patient also has history of Vitamin D Deficiency and supplements vitamin D without any suspected side-effects.  Lab Results  Component Value Date   VD25OH 65 09/20/2015    Names of Other Physician/Practitioners you currently use: 1. [facility] here for primary care 2. Dr Talbert Forest, eye doctor, last visit Jan 2017, goes back in Oct 3. Dr Olena Heckle, [place], dentist, last visit Jan 2017  Patient Care Team: Unk Pinto, MD as PCP - General (Internal Medicine) Darleen Crocker, MD as Consulting Physician (Ophthalmology) Ladene Artist, MD as Consulting Physician (Gastroenterology) Cameron Sprang, MD as Consulting Physician (Neurology)  Medication Review: Current Outpatient Prescriptions on File Prior to Visit  Medication Sig Dispense Refill  . atenolol (TENORMIN) 100 MG tablet TAKE 1 TABLET BY MOUTH EVERY MORNING (Patient taking differently: TAKE 0.5 TABLET BY MOUTH EVERY MORNING) 90 tablet 3  . cholecalciferol (VITAMIN D) 1000 UNITS tablet Take 5,000 Units by mouth daily.     . clonazePAM (KLONOPIN) 1 MG tablet TAKE 1/2-1 TABLET BY MOUTH UP TO 3 TIMESDAILY IF NEEDED FOR NERVES 90 tablet 1  . diclofenac sodium (VOLTAREN) 1 % GEL Apply 4 g topically 4 (four) times daily. 100 g 99  . dicyclomine (BENTYL) 20 MG tablet Take 1 tablet 3 x day before meals for bladder pains  90 tablet 2  . gabapentin (NEURONTIN) 300 MG capsule TAKE 1 CAPSULE BY MOUTH 3-4 TIMES DAILY FOR FACIAL PAIN 360 capsule 1  . hyoscyamine (LEVSIN SL) 0.125 MG SL tablet Dissolve one tablet by mouth every 4-6 hours as needed 60 tablet 3  . losartan (COZAAR) 100 MG tablet TAKE 1/2 TABLET BY MOUTH DAILY 90 tablet 1  . pravastatin (PRAVACHOL) 40 MG tablet Take 1 tablet (40 mg total) by mouth daily. (Patient taking differently: Take 20 mg by mouth daily. ) 90 tablet 4  . Probiotic Product (FLORAJEN3 PO) Take 1 tablet by mouth daily.     . ranitidine (ZANTAC) 300 MG tablet TAKE 1 TABLET BY MOUTH TWICE DAILY AS NEEDED FOR ACID INDIGESTION ANDREFLUX 180 tablet 0  . [DISCONTINUED] omeprazole (PRILOSEC OTC) 20 MG tablet Take 20 mg by mouth daily.     No current facility-administered medications on file prior to visit.    Current Problems (verified) Patient Active Problem List   Diagnosis Date Noted  . COPD (chronic obstructive pulmonary disease) with emphysema ([place]) 12/21/2015  . 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 Osteoporosis 12/21/2015  . Mild malnutrition ([place]) 05/11/2015  . Encounter for Medicare annual wellness exam 04/08/2015  . Prediabetes 01/27/2014  . Medication management 01/27/2014  . 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 Vitamin D deficiency 07/08/2013  . Hypertension   .  Hyperlipidemia   . GERD   . Headache, post-traumatic, chronic 09/20/2011  . Fracture, zygoma closed ([place]) 07/13/2011    Screening Tests Immunization History  Administered Date(s) Administered  . Influenza, High Dose Seasonal PF 03/25/2014  . Influenza-Unspecified 03/09/2013, 03/11/2015  . Pneumococcal Polysaccharide-23 06/25/2002  . Td 06/26/2003   Influenza 2016 Pneumonia 2004 Tdap 2005, declines Shingles declines Prevnar DUE  Preventative care: Last colonoscopy: 03/17/2012 MGM screening 06/2014, [facility] 07/2015 DEXA 02/2014 + osteoporosis Stress test 2006  Allergies Allergies  Allergen Reactions   . Latex Hives    TONGUE HAD BLISTERS WHEN HAD DENTAL SURGERY  . Amoxicillin Nausea And Vomiting  . Chlorhexidine Gluconate Itching  . Aspirin     Gi upset  . Barbiturates   . Celebrex [Celecoxib]   . Codeine Itching  . Evista [Raloxifene]   . Lipitor [Atorvastatin]   . Morphine And Related   . Oruvail [Ketoprofen]   . Paraffin   . Prochlorperazine Other (See Comments)    Uncontrolled shaking  . Proloprim [Trimethoprim]   . Vibra-Tab [Doxycycline]   . Vioxx [Rofecoxib]     edema  . Betadine [Povidone Iodine] Rash  . Caffeine Palpitations   MEDICARE WELLNESS OBJECTIVES: Physical activity: Current Exercise Habits: The patient does not participate in regular exercise at present Cardiac risk factors: Cardiac Risk Factors include: advanced age (>74men, >52 women);dyslipidemia;hypertension;sedentary lifestyle Depression/mood screen:   Depression screen [facility] 2/9 12/21/2015  Decreased Interest 0  Down, Depressed, Hopeless 0  PHQ - 2 Score 0    ADLs:  In your present state of health, do you have any difficulty performing the following activities: 12/21/2015 09/20/2015  Hearing? Tempie Donning  Vision? N N  Difficulty concentrating or making decisions? N N  Walking or climbing stairs? Y N  Dressing or bathing? N N  Doing errands, shopping? N N  Preparing Food and eating ? N -  Using the Toilet? N -  In the past six months, have you accidently leaked urine? N -  Do you have problems with loss of bowel control? N -  Managing your Medications? N -  Managing your Finances? N -  Housekeeping or managing your Housekeeping? Y -     Cognitive Testing  Alert? Yes  Normal Appearance?Yes  Oriented to person? Yes  Place? Yes   Time? Yes  Recall of three objects?  Yes  Can perform simple calculations? Yes  Displays appropriate judgment?Yes  Can read the correct time from a watch face?Yes  EOL planning: Does patient have an advance directive?: Yes Type of Advance Directive: Living will Does  patient want to make changes to advanced directive?: No - Patient declined Copy of advanced directive(s) in chart?: No - copy requested  SURGICAL HISTORY She  has past surgical history that includes Abdominal hysterectomy; Hand surgery; Shoulder arthroscopy w/ rotator cuff repair; Cervical disc surgery; Back surgery; ORIF tripod fracture (07/13/2011); Colonoscopy; Facial fracture surgery; and Back surgery. FAMILY HISTORY Her family history includes Breast cancer in her sister; Cancer in her sister, sister, and sister; Colon cancer in her sister; Heart disease in her father and mother; Hyperlipidemia in her daughter; Hypertension in her daughter. SOCIAL HISTORY She  reports that she has never smoked. She has never used smokeless tobacco. She reports that she does not drink alcohol or use illicit drugs.  Review of Systems  Constitutional: Negative for fever, chills and diaphoresis.  HENT: Positive for hearing loss. Negative for congestion, ear discharge, ear pain, nosebleeds, sore throat and tinnitus.  Respiratory: Negative.  Negative for cough and stridor.   Cardiovascular: Negative.   Gastrointestinal: Positive for abdominal pain and diarrhea. Negative for nausea, vomiting, constipation and blood in stool.  Genitourinary: Negative.   Musculoskeletal: Positive for back pain. Negative for myalgias and neck pain.  Skin: Negative.   Neurological: Negative for dizziness and headaches.     Objective:     Today's Vitals   12/21/15 1013  BP: 118/60  Pulse: 91  Temp: 97.5 F (36.4 C)  TempSrc: Temporal  Resp: 14  Height: 5\' 4"  (1.626 m)  Weight: 111 lb 11.2 oz (50.667 kg)  SpO2: 97%  PainSc: 6   PainLoc: Face    General Appearance: Well nourished, alert, WD/WN, female and in no apparent distress. Eyes: PERRLA, EOMs, conjunctiva no swelling or erythema, normal fundi and vessels. Sinuses: No frontal/maxillary tenderness ENT/Mouth: EACs patent / TMs  nl. Nares clear without erythema,  swelling, mucoid exudates. Oral hygiene is good. No erythema, swelling, or exudate. Tongue normal, non-obstructing. Tonsils not swollen or erythematous. Hearing normal.  Neck: Supple, thyroid normal. No bruits, nodes or JVD. Respiratory: Respiratory effort normal.  BS equal and clear bilateral without rales, rhonci, wheezing or stridor. Cardio: Heart sounds are normal with regular rate and rhythm and no murmurs, rubs or gallops. Peripheral pulses are normal and equal bilaterally without edema. No aortic or femoral bruits. Chest: symmetric with normal excursions and percussion. Breasts: defer Abdomen: Flat, soft  with nl bowel sounds. Nontender, no guarding, rebound, hernias, masses, or organomegaly.  Lymphatics: Non tender without lymphadenopathy.  Genitourinary: defer Musculoskeletal: Full ROM all peripheral extremities, joint stability, 5/5 strength, and normal gait. Skin: Warm and dry without rashes, lesions, cyanosis, clubbing or  ecchymosis.  Neuro: Cranial nerves intact, reflexes equal bilaterally. Normal muscle tone, no cerebellar symptoms. Sensation intact.  Pysch: Alert and oriented X 3, normal affect, Insight and Judgment appropriate.   Medicare Attestation I have personally reviewed: The patient's medical and social history Their use of alcohol, tobacco or illicit drugs Their current medications and supplements The patient's functional ability including ADLs,fall risks, home safety risks, cognitive, and hearing and visual impairment Diet and physical activities Evidence for depression or mood disorders  The patient's weight, height, BMI, and visual acuity have been recorded in the chart.  I have made referrals, counseling, and provided education to the patient based on review of the above and I have provided the patient with a written personalized care plan for preventive services.  Over 40 minutes of exam, counseling, chart review was performed.  Vicie Mutters, PA-C   12/21/2015

## 2015-12-21 NOTE — Patient Instructions (Signed)
Go back to audiologist for your ears.

## 2015-12-22 LAB — HEMOGLOBIN A1C
HEMOGLOBIN A1C: 5.8 % — AB (ref <5.7)
MEAN PLASMA GLUCOSE: 120 mg/dL

## 2015-12-22 LAB — VITAMIN D 25 HYDROXY (VIT D DEFICIENCY, FRACTURES): VIT D 25 HYDROXY: 75 ng/mL (ref 30–100)

## 2015-12-30 DIAGNOSIS — M7541 Impingement syndrome of right shoulder: Secondary | ICD-10-CM | POA: Diagnosis not present

## 2016-02-13 ENCOUNTER — Other Ambulatory Visit: Payer: Self-pay | Admitting: Internal Medicine

## 2016-02-13 ENCOUNTER — Other Ambulatory Visit: Payer: Self-pay | Admitting: *Deleted

## 2016-02-13 MED ORDER — CLONAZEPAM 1 MG PO TABS: ORAL_TABLET | ORAL | 1 refills | Status: DC

## 2016-03-12 ENCOUNTER — Other Ambulatory Visit: Payer: PPO

## 2016-03-14 ENCOUNTER — Other Ambulatory Visit: Payer: Self-pay | Admitting: Internal Medicine

## 2016-03-16 ENCOUNTER — Ambulatory Visit
Admission: RE | Admit: 2016-03-16 | Discharge: 2016-03-16 | Disposition: A | Discharge status: Home / Self Care | Payer: PPO | Source: Ambulatory Visit | Attending: Internal Medicine | Admitting: Internal Medicine

## 2016-03-16 DIAGNOSIS — M810 Age-related osteoporosis without current pathological fracture: Secondary | ICD-10-CM

## 2016-03-16 DIAGNOSIS — Z780 Asymptomatic menopausal state: Secondary | ICD-10-CM | POA: Diagnosis not present

## 2016-03-19 ENCOUNTER — Other Ambulatory Visit: Payer: Self-pay | Admitting: Internal Medicine

## 2016-03-22 ENCOUNTER — Ambulatory Visit: Payer: Self-pay | Admitting: Internal Medicine

## 2016-03-27 DIAGNOSIS — Z961 Presence of intraocular lens: Secondary | ICD-10-CM | POA: Diagnosis not present

## 2016-03-27 DIAGNOSIS — H18413 Arcus senilis, bilateral: Secondary | ICD-10-CM | POA: Diagnosis not present

## 2016-03-27 DIAGNOSIS — H02839 Dermatochalasis of unspecified eye, unspecified eyelid: Secondary | ICD-10-CM | POA: Diagnosis not present

## 2016-03-27 DIAGNOSIS — I10 Essential (primary) hypertension: Secondary | ICD-10-CM | POA: Diagnosis not present

## 2016-03-30 ENCOUNTER — Ambulatory Visit (INDEPENDENT_AMBULATORY_CARE_PROVIDER_SITE_OTHER): Payer: PPO | Admitting: Physician Assistant

## 2016-03-30 ENCOUNTER — Encounter: Payer: Self-pay | Admitting: Physician Assistant

## 2016-03-30 VITALS — BP 116/70 | HR 61 | Temp 97.9°F | Resp 14 | Ht 64.0 in | Wt 112.2 lb

## 2016-03-30 DIAGNOSIS — M791 Myalgia, unspecified site: Secondary | ICD-10-CM

## 2016-03-30 DIAGNOSIS — R350 Frequency of micturition: Secondary | ICD-10-CM

## 2016-03-30 LAB — CBC WITH DIFFERENTIAL/PLATELET
BASOS ABS: 0 {cells}/uL (ref 0–200)
Basophils Relative: 0 %
EOS ABS: 125 {cells}/uL (ref 15–500)
Eosinophils Relative: 1 %
HEMATOCRIT: 36.3 % (ref 35.0–45.0)
HEMOGLOBIN: 12.2 g/dL (ref 11.7–15.5)
LYMPHS ABS: 2000 {cells}/uL (ref 850–3900)
LYMPHS PCT: 16 %
MCH: 31.2 pg (ref 27.0–33.0)
MCHC: 33.6 g/dL (ref 32.0–36.0)
MCV: 92.8 fL (ref 80.0–100.0)
MPV: 10 fL (ref 7.5–12.5)
Monocytes Absolute: 1375 cells/uL — ABNORMAL HIGH (ref 200–950)
Monocytes Relative: 11 %
NEUTROS ABS: 9000 {cells}/uL — AB (ref 1500–7800)
NEUTROS PCT: 72 %
Platelets: 217 10*3/uL (ref 140–400)
RBC: 3.91 MIL/uL (ref 3.80–5.10)
RDW: 13.7 % (ref 11.0–15.0)
WBC: 12.5 10*3/uL — ABNORMAL HIGH (ref 3.8–10.8)

## 2016-03-30 LAB — BASIC METABOLIC PANEL WITH GFR
BUN: 9 mg/dL (ref 7–25)
CALCIUM: 9.2 mg/dL (ref 8.6–10.4)
CO2: 30 mmol/L (ref 20–31)
Chloride: 94 mmol/L — ABNORMAL LOW (ref 98–110)
Creat: 0.8 mg/dL (ref 0.60–0.88)
GFR, EST AFRICAN AMERICAN: 81 mL/min (ref >=60)
GFR, Est Non African American: 70 mL/min (ref >=60)
GLUCOSE: 89 mg/dL (ref 65–99)
Potassium: 4.6 mmol/L (ref 3.5–5.3)
Sodium: 133 mmol/L — ABNORMAL LOW (ref 135–146)

## 2016-03-30 LAB — URIC ACID: URIC ACID, SERUM: 3.4 mg/dL (ref 2.5–7.0)

## 2016-03-30 LAB — CK: Total CK: 33 U/L (ref 7–177)

## 2016-03-30 LAB — HEPATIC FUNCTION PANEL
ALBUMIN: 4 g/dL (ref 3.6–5.1)
ALT: 8 U/L (ref 6–29)
AST: 17 U/L (ref 10–35)
Alkaline Phosphatase: 63 U/L (ref 33–130)
Bilirubin, Direct: 0.2 mg/dL (ref <=0.2)
Indirect Bilirubin: 0.6 mg/dL (ref 0.2–1.2)
TOTAL PROTEIN: 7.1 g/dL (ref 6.1–8.1)
Total Bilirubin: 0.8 mg/dL (ref 0.2–1.2)

## 2016-03-30 MED ORDER — PREDNISONE 10 MG PO TABS: ORAL_TABLET | ORAL | 0 refills | Status: DC

## 2016-03-30 MED ORDER — CIPROFLOXACIN HCL 500 MG PO TABS: 500.0000 mg | ORAL_TABLET | Freq: Two times a day (BID) | ORAL | 0 refills | Status: DC

## 2016-03-30 NOTE — Patient Instructions (Signed)
Muscle Pain, Adult  Muscle pain (myalgia) may be caused by many things, including:  · Overuse or muscle strain, especially if you are not in shape. This is the most common cause of muscle pain.  · Injury.  · Bruises.  · Viruses, such as the flu.  · Infectious diseases.  · Fibromyalgia, which is a chronic condition that causes muscle tenderness, fatigue, and headache.  · Autoimmune diseases, including lupus.  · Certain drugs, including ACE inhibitors and statins.  Muscle pain may be mild or severe. In most cases, the pain lasts only a short time and goes away without treatment. To diagnose the cause of your muscle pain, your health care provider will take your medical history. This means he or she will ask you when your muscle pain began and what has been happening. If you have not had muscle pain for very long, your health care provider may want to wait before doing much testing. If your muscle pain has lasted a long time, your health care provider may want to run tests right away. If your health care provider thinks your muscle pain may be caused by illness, you may need to have additional tests to rule out certain conditions.   Treatment for muscle pain depends on the cause. Home care is often enough to relieve muscle pain. Your health care provider may also prescribe anti-inflammatory medicine.  HOME CARE INSTRUCTIONS  Watch your condition for any changes. The following actions may help to lessen any discomfort you are feeling:  · Only take over-the-counter or prescription medicines as directed by your health care provider.  · Apply ice to the sore muscle:    Put ice in a plastic bag.    Place a towel between your skin and the bag.    Leave the ice on for 15-20 minutes, 3-4 times a day.  · You may alternate applying hot and cold packs to the muscle as directed by your health care provider.  · If overuse is causing your muscle pain, slow down your activities until the pain goes away.    Remember that it is normal  to feel some muscle pain after starting a workout program. Muscles that have not been used often will be sore at first.    Do regular, gentle exercises if you are not usually active.    Warm up before exercising to lower your risk of muscle pain.  · Do not continue working out if the pain is very bad. Bad pain could mean you have injured a muscle.  SEEK MEDICAL CARE IF:  · Your muscle pain gets worse, and medicines do not help.  · You have muscle pain that lasts longer than 3 days.  · You have a rash or fever along with muscle pain.  · You have muscle pain after a tick bite.  · You have muscle pain while working out, even though you are in good physical condition.  · You have redness, soreness, or swelling along with muscle pain.  · You have muscle pain after starting a new medicine or changing the dose of a medicine.  SEEK IMMEDIATE MEDICAL CARE IF:  · You have trouble breathing.  · You have trouble swallowing.  · You have muscle pain along with a stiff neck, fever, and vomiting.  · You have severe muscle weakness or cannot move part of your body.  MAKE SURE YOU:   · Understand these instructions.  · Will watch your condition.  · Will get   help right away if you are not doing well or get worse.     This information is not intended to replace advice given to you by your health care provider. Make sure you discuss any questions you have with your health care provider.     Document Released: 05/03/2006 Document Revised: 07/02/2014 Document Reviewed: 04/07/2013  Elsevier Interactive Patient Education ©2016 [employer]

## 2016-03-30 NOTE — Progress Notes (Signed)
Subjective:    Patient ID: Debra White, female    DOB: [birth year], 82 y.o.   MRN: [id number]  HPI 82 y.o. WF presents with myalgias and lower back pain. She took tramadol and tylenol and states that the pain is better. She complains of lower back pain, pain at her hips, no pain down her legs. She has had some pain with urination, some AB pain diffuse. She states that she will get prednisone for the soreness that will help.    Blood pressure 116/70, pulse 61, temperature 97.9 F (36.6 C), resp. rate 14, height 5\' 4"  (1.626 m), weight 112 lb 3.2 oz (50.9 kg), SpO2 96 %.  Medications Current Outpatient Prescriptions on File Prior to Visit  Medication Sig  . atenolol (TENORMIN) 100 MG tablet TAKE 1 TABLET BY MOUTH EVERY MORNING  . cholecalciferol (VITAMIN D) 1000 UNITS tablet Take 5,000 Units by mouth daily.   . clonazePAM (KLONOPIN) 1 MG tablet TAKE 1/2-1 TABLET BY MOUTH UP TO 3 TIMESDAILY IF NEEDED FOR NERVES  . diclofenac sodium (VOLTAREN) 1 % GEL Apply 4 g topically 4 (four) times daily.  Marland Kitchen dicyclomine (BENTYL) 20 MG tablet Take 1 tablet 3 x day before meals for bladder pains  . gabapentin (NEURONTIN) 300 MG capsule TAKE 1 CAPSULE BY MOUTH 3-4 TIMES DAILY FOR FACIAL PAIN  . hyoscyamine (LEVSIN SL) 0.125 MG SL tablet Dissolve one tablet by mouth every 4-6 hours as needed  . losartan (COZAAR) 100 MG tablet TAKE 1/2 TABLET BY MOUTH DAILY  . pravastatin (PRAVACHOL) 40 MG tablet TAKE 1 TABLET BY MOUTH ONCE DAILY  . Probiotic Product (FLORAJEN3 PO) Take 1 tablet by mouth daily.   . ranitidine (ZANTAC) 300 MG tablet TAKE 1 TABLET BY MOUTH TWICE DAILY AS NEEDED FOR ACID INDIGESTION ANDREFLUX  . traMADol (ULTRAM) 50 MG tablet TAKE 1 TABLET BY MOUTH 4 TIMES DAILY AS NEEDED FOR PAIN  . [DISCONTINUED] omeprazole (PRILOSEC OTC) 20 MG tablet Take 20 mg by mouth daily.   No current facility-administered medications on file prior to visit.     Problem list She has Fracture, zygoma closed ([place]);  Headache, post-traumatic, chronic; Hypertension; Hyperlipidemia; GERD; Vitamin D deficiency; Atypical facial pain; Prediabetes; Medication management; Encounter for Medicare annual wellness exam; Mild malnutrition ([place]); COPD (chronic obstructive pulmonary disease) with emphysema ([place]); Tortuous aorta (HCC); and Osteoporosis on her problem list.   Review of Systems  Constitutional: Positive for fatigue. Negative for chills, diaphoresis and fever.  HENT: Negative.   Respiratory: Negative.  Negative for cough.   Cardiovascular: Negative.   Gastrointestinal: Positive for abdominal pain and diarrhea. Negative for abdominal distention, anal bleeding, blood in stool, constipation, nausea, rectal pain and vomiting.  Genitourinary: Negative.   Musculoskeletal: Positive for arthralgias and back pain. Negative for gait problem, joint swelling, myalgias, neck pain and neck stiffness.  Skin: Negative.   Neurological: Negative for dizziness and headaches.       Objective:   Physical Exam  Constitutional: She is oriented to person, place, and time. She appears well-developed. She appears cachectic. No distress.  HENT:  Head: Normocephalic and atraumatic.  Eyes: Conjunctivae are normal. Pupils are equal, round, and reactive to light.  Neck: Normal range of motion. Neck supple.  Cardiovascular: Normal rate, regular rhythm and normal heart sounds.   Pulmonary/Chest: Effort normal and breath sounds normal. She has no wheezes. She has no rales. She exhibits no tenderness.  Abdominal: Soft. Bowel sounds are increased. There is generalized tenderness. There is no  rigidity, no rebound, no guarding, no CVA tenderness and no tenderness at McBurney's point.  Musculoskeletal: Normal range of motion. She exhibits no edema or tenderness.  Diffuse tenderness with palpation, negative straight leg raise.   Lymphadenopathy:    She has no cervical adenopathy.  Neurological: She is alert and oriented to person, place,  and time. No cranial nerve deficit. Coordination normal.  Skin: Skin is warm and dry. No rash noted.      Assessment & Plan:  Myalgias? FM, will get labs to rule out infection including urine- prednisone RX, tramadol/tylenol PRN, no SOB, CP, go to ER if any changes. Close follow up  Future Appointments Date Time Provider [place]  04/05/2016 11:15 AM Unk Pinto, MD [facility] None  10/26/2016 10:00 AM Unk Pinto, MD [facility] None

## 2016-03-31 LAB — URINALYSIS, ROUTINE W REFLEX MICROSCOPIC
Bilirubin Urine: NEGATIVE
GLUCOSE, UA: NEGATIVE
HGB URINE DIPSTICK: NEGATIVE
KETONES UR: NEGATIVE
LEUKOCYTES UA: NEGATIVE
NITRITE: NEGATIVE
PH: 6 (ref 5.0–8.0)
PROTEIN: NEGATIVE
Specific Gravity, Urine: 1.012 (ref 1.001–1.035)

## 2016-03-31 LAB — SEDIMENTATION RATE: Sed Rate: 22 mm/hr (ref 0–30)

## 2016-04-01 LAB — URINE CULTURE: Organism ID, Bacteria: NO GROWTH

## 2016-04-04 NOTE — Progress Notes (Signed)
[facility] Unk Pinto, M.D.        Uvaldo Bristle. Silverio Lay, P.A.-C       Starlyn Skeans, P.A.-C  [facility]                [address] [place] [id number] Telephone [phone] Telefax [phone] ______________________________________________________________________     This very nice 82 y.o. [facility] presents for 6 month follow up with Hypertension, Hyperlipidemia, Pre-Diabetes and Vitamin D Deficiency. Patient was treated 1 week ago for suspected UTI, but culture finally returned and was negative. 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 She has had a 30# weight loss since that time due to pain & dysgeusia.      Patient is treated for HTN (1990) & BP has been controlled at home. Today's BP is 128/68. Patient has had no complaints of any cardiac type chest pain, palpitations, dyspnea/orthopnea/PND, dizziness, claudication, or dependent edema.     Hyperlipidemia is controlled with diet & meds. Patient denies myalgias or other med SE's. Last Lipids were at goal: Lab Results  Component Value Date   CHOL 172 12/21/2015   HDL 80 12/21/2015   LDLCALC 67 12/21/2015   TRIG 123 12/21/2015   CHOLHDL 2.2 12/21/2015      Also, the patient has history of PreDiabetes with A1c 6.8% Diabetic range in 2011 dropping after 30# weight loss to 5.8% in 2013 and then 5.3% in Dec 2016.  and has had no symptoms of reactive hypoglycemia, diabetic polys, paresthesias or visual blurring.  Last A1c was near goal:  Lab Results  Component Value Date   HGBA1C 5.8 (H) 12/21/2015      Further, the patient also has history of Vitamin D Deficiency and supplements vitamin D without any suspected side-effects. Last vitamin D was at goal: Lab Results  Component Value Date   VD25OH 82 12/21/2015   Current Outpatient  Prescriptions on File Prior to Visit  Medication Sig  . atenolol (TENORMIN) 100 MG tablet TAKE 1 TABLET BY MOUTH EVERY MORNING  . cholecalciferol (VITAMIN D) 1000 UNITS tablet Take 5,000 Units by mouth daily.   . clonazePAM (KLONOPIN) 1 MG tablet TAKE 1/2-1 TABLET BY MOUTH UP TO 3 TIMESDAILY IF NEEDED FOR NERVES  . diclofenac sodium (VOLTAREN) 1 % GEL Apply 4 g topically 4 (four) times daily.  Marland Kitchen dicyclomine (BENTYL) 20 MG tablet Take 1 tablet 3 x day before meals for bladder pains  . gabapentin (NEURONTIN) 300 MG capsule TAKE 1 CAPSULE BY MOUTH 3-4 TIMES DAILY FOR FACIAL PAIN  . hyoscyamine (LEVSIN SL) 0.125 MG SL tablet Dissolve one tablet by mouth every 4-6 hours as needed  . losartan (COZAAR) 100 MG tablet TAKE 1/2 TABLET BY MOUTH DAILY  . pravastatin (PRAVACHOL) 40 MG tablet TAKE 1 TABLET BY MOUTH ONCE DAILY  . Probiotic Product (FLORAJEN3 PO) Take 1 tablet by mouth daily.   . ranitidine (ZANTAC) 300 MG tablet TAKE 1 TABLET BY MOUTH TWICE DAILY AS NEEDED FOR ACID INDIGESTION ANDREFLUX  . traMADol (ULTRAM) 50 MG tablet TAKE 1 TABLET BY MOUTH 4 TIMES DAILY AS NEEDED FOR PAIN  . [DISCONTINUED] omeprazole (PRILOSEC OTC) 20 MG tablet Take 20 mg by mouth daily.   No current facility-administered medications on file prior to visit.  Allergies  Allergen Reactions  . Latex Hives    TONGUE HAD BLISTERS WHEN HAD DENTAL SURGERY  . Amoxicillin Nausea And Vomiting  . Chlorhexidine Gluconate Itching  . Aspirin     Gi upset  . Barbiturates   . Celebrex [Celecoxib]   . Codeine Itching  . Evista [Raloxifene]   . Lipitor [Atorvastatin]   . Morphine And Related   . Oruvail [Ketoprofen]   . Paraffin   . Prochlorperazine Other (See Comments)    Uncontrolled shaking  . Proloprim [Trimethoprim]   . Vibra-Tab [Doxycycline]   . Vioxx [Rofecoxib]     edema  . Betadine [Povidone Iodine] Rash  . Caffeine Palpitations   PMHx:   Past Medical History:  Diagnosis Date  . Anxiety   . Arthritis    . Blood transfusion   . Candida esophagitis ([place]) 2013   EGD  . Cataract   . Chronic kidney disease    STONES  . Diverticulosis of colon (without mention of hemorrhage) 2007   Colonoscopy  . Dysrhythmia    RX  . Emphysema of lung ([place])   . Family history of colon cancer    sister  . Fibromyalgia   . GERD (gastroesophageal reflux disease)   . Headache(784.0)   . Hyperlipemia   . Hypertension   . Kidney stones    Immunization History  Administered Date(s) Administered  . Influenza, High Dose Seasonal PF 03/25/2014  . Influenza-Unspecified 03/09/2013, 03/11/2015  . Pneumococcal Conjugate-13 12/21/2015  . Pneumococcal Polysaccharide-23 06/25/2002  . Td 06/26/2003   Past Surgical History:  Procedure Laterality Date  . ABDOMINAL HYSTERECTOMY    . BACK SURGERY     X2  . BACK SURGERY    . CERVICAL DISC SURGERY    . COLONOSCOPY    . FACIAL FRACTURE SURGERY    . HAND SURGERY     BIL   . ORIF TRIPOD FRACTURE  07/13/2011   Procedure: OPEN REDUCTION INTERNAL FIXATION (ORIF) TRIPOD FRACTURE;  Surgeon: Tyson Alias, MD;  Location: [facility];  Service: ENT;  Laterality: Right;  ORIF RIGHT ZYGOMA, ORBITAL FLOOR EXPLORATION WITH FROST STITCH (TEMPORARY TARSORRHAPHY)  . SHOULDER ARTHROSCOPY W/ ROTATOR CUFF REPAIR     LFT   FHx:    Reviewed / unchanged  SHx:    Reviewed / unchanged  Systems Review:  Constitutional: Denies fever, chills, wt changes, headaches, insomnia, fatigue, night sweats, change in appetite. Eyes: Denies redness, blurred vision, diplopia, discharge, itchy, watery eyes.  ENT: Denies discharge, congestion, post nasal drip, epistaxis, sore throat, earache, hearing loss, dental pain, tinnitus, vertigo, sinus pain, snoring.  CV: Denies chest pain, palpitations, irregular heartbeat, syncope, dyspnea, diaphoresis, orthopnea, PND, claudication or edema. Respiratory: denies cough, dyspnea, DOE, pleurisy, hoarseness, laryngitis, wheezing.  Gastrointestinal: Denies  dysphagia, odynophagia, heartburn, reflux, water brash, abdominal pain or cramps, nausea, vomiting, bloating, diarrhea, constipation, hematemesis, melena, hematochezia  or hemorrhoids. Genitourinary: Denies dysuria, frequency, urgency, nocturia, hesitancy, discharge, hematuria or flank pain. Musculoskeletal: Denies arthralgias, myalgias, stiffness, jt. swelling, pain, limping or strain/sprain.  Skin: Denies pruritus, rash, hives, warts, acne, eczema or change in skin lesion(s). Neuro: No weakness, tremor, incoordination, spasms, paresthesia or pain. Psychiatric: Denies confusion, memory loss or sensory loss. Endo: Denies change in weight, skin or hair change.  Heme/Lymph: No excessive bleeding, bruising or enlarged lymph nodes.  Physical Exam BP 128/68   Pulse 60   Temp 97.5 F (36.4 C)   Resp 16   Ht 5\' 4"  (1.626 m)   Wt  112 lb 6.4 oz (51 kg)   BMI 19.29 kg/m   Appears well nourished and in no distress.  Eyes: PERRLA, EOMs, conjunctiva no swelling or erythema. Sinuses: No frontal/maxillary tenderness ENT/Mouth: EAC's clear, TM's nl w/o erythema, bulging. Nares clear w/o erythema, swelling, exudates. Oropharynx clear without erythema or exudates. Oral hygiene is good. Tongue normal, non obstructing. Hearing intact.  Neck: Supple. Thyroid nl. Car 2+/2+ without bruits, nodes or JVD. Chest: Respirations nl with BS clear & equal w/o rales, rhonchi, wheezing or stridor.  Cor: Heart sounds normal w/ regular rate and rhythm without sig. murmurs, gallops, clicks, or rubs. Peripheral pulses normal and equal  without edema.  Abdomen: Soft & bowel sounds normal. Non-tender w/o guarding, rebound, hernias, masses, or organomegaly.  Lymphatics: Unremarkable.  Musculoskeletal: Full ROM all peripheral extremities, joint stability, 5/5 strength, and normal gait.  Skin: Warm, dry without exposed rashes, lesions or ecchymosis apparent.  Neuro: Cranial nerves intact, reflexes equal bilaterally.  Sensory-motor testing grossly intact. Tendon reflexes grossly intact.  Pysch: Alert & oriented x 3.  Insight and judgement nl & appropriate. No ideations.  Assessment and Plan:  1. Essential hypertension  - Continue medication, monitor blood pressure at home. Continue DASH diet. Reminder to go to the ER if any CP, SOB, nausea, dizziness, severe HA, changes vision/speech, left arm numbness and tingling and jaw pain. - TSH  2. Mixed hyperlipidemia  - Continue diet/meds, exercise,& lifestyle modifications. Continue monitor periodic cholesterol/liver & renal functions  - Lipid panel - TSH  3. Prediabetes  - Continue diet, exercise, lifestyle modifications. Monitor appropriate labs. - Hemoglobin A1c - Insulin, random  4. Vitamin D deficiency  - Continue supplementation. - VITAMIN D 25 Hydroxy   5. Gastroesophageal reflux disease   6. Medication management  - CBC with Differential/Platelet - BASIC METABOLIC PANEL WITH GFR - Hepatic function panel - Magnesium      Recommended regular exercise, BP monitoring, weight control, and discussed med and SE's. Recommended labs to assess and monitor clinical status. Further disposition pending results of labs. Over 30 minutes of exam, counseling, chart review was performed

## 2016-04-04 NOTE — Patient Instructions (Signed)

## 2016-04-05 ENCOUNTER — Encounter: Payer: Self-pay | Admitting: Internal Medicine

## 2016-04-05 ENCOUNTER — Ambulatory Visit (INDEPENDENT_AMBULATORY_CARE_PROVIDER_SITE_OTHER): Payer: PPO | Admitting: Internal Medicine

## 2016-04-05 VITALS — BP 128/68 | HR 60 | Temp 97.5°F | Resp 16 | Ht 64.0 in | Wt 112.4 lb

## 2016-04-05 DIAGNOSIS — Z79899 Other long term (current) drug therapy: Secondary | ICD-10-CM | POA: Diagnosis not present

## 2016-04-05 DIAGNOSIS — K219 Gastro-esophageal reflux disease without esophagitis: Secondary | ICD-10-CM | POA: Diagnosis not present

## 2016-04-05 DIAGNOSIS — E559 Vitamin D deficiency, unspecified: Secondary | ICD-10-CM | POA: Diagnosis not present

## 2016-04-05 DIAGNOSIS — R7303 Prediabetes: Secondary | ICD-10-CM

## 2016-04-05 DIAGNOSIS — I10 Essential (primary) hypertension: Secondary | ICD-10-CM

## 2016-04-05 DIAGNOSIS — Z23 Encounter for immunization: Secondary | ICD-10-CM

## 2016-04-05 DIAGNOSIS — E782 Mixed hyperlipidemia: Secondary | ICD-10-CM

## 2016-04-05 LAB — HEPATIC FUNCTION PANEL
ALBUMIN: 4.1 g/dL (ref 3.6–5.1)
ALK PHOS: 55 U/L (ref 33–130)
ALT: 13 U/L (ref 6–29)
AST: 18 U/L (ref 10–35)
BILIRUBIN TOTAL: 0.4 mg/dL (ref 0.2–1.2)
Bilirubin, Direct: 0.1 mg/dL (ref <=0.2)
Indirect Bilirubin: 0.3 mg/dL (ref 0.2–1.2)
Total Protein: 6.9 g/dL (ref 6.1–8.1)

## 2016-04-05 LAB — BASIC METABOLIC PANEL WITH GFR
BUN: 13 mg/dL (ref 7–25)
CHLORIDE: 95 mmol/L — AB (ref 98–110)
CO2: 29 mmol/L (ref 20–31)
Calcium: 9.2 mg/dL (ref 8.6–10.4)
Creat: 0.72 mg/dL (ref 0.60–0.88)
GFR, Est African American: >89 mL/min (ref >=60)
GFR, Est Non African American: 79 mL/min (ref >=60)
Glucose, Bld: 95 mg/dL (ref 65–99)
POTASSIUM: 4.4 mmol/L (ref 3.5–5.3)
Sodium: 135 mmol/L (ref 135–146)

## 2016-04-05 LAB — CBC WITH DIFFERENTIAL/PLATELET
BASOS PCT: 0 %
Basophils Absolute: 0 cells/uL (ref 0–200)
EOS ABS: 0 {cells}/uL — AB (ref 15–500)
Eosinophils Relative: 0 %
HEMATOCRIT: 37.2 % (ref 35.0–45.0)
HEMOGLOBIN: 12.2 g/dL (ref 11.7–15.5)
LYMPHS ABS: 1512 {cells}/uL (ref 850–3900)
Lymphocytes Relative: 14 %
MCH: 30.7 pg (ref 27.0–33.0)
MCHC: 32.8 g/dL (ref 32.0–36.0)
MCV: 93.7 fL (ref 80.0–100.0)
MONO ABS: 540 {cells}/uL (ref 200–950)
MONOS PCT: 5 %
MPV: 9 fL (ref 7.5–12.5)
NEUTROS ABS: 8748 {cells}/uL — AB (ref 1500–7800)
Neutrophils Relative %: 81 %
PLATELETS: 323 10*3/uL (ref 140–400)
RBC: 3.97 MIL/uL (ref 3.80–5.10)
RDW: 13.8 % (ref 11.0–15.0)
WBC: 10.8 10*3/uL (ref 3.8–10.8)

## 2016-04-05 LAB — LIPID PANEL
CHOL/HDL RATIO: 1.9 ratio (ref <=5.0)
Cholesterol: 152 mg/dL (ref 125–200)
HDL: 80 mg/dL (ref >=46)
LDL CALC: 44 mg/dL (ref <130)
Triglycerides: 139 mg/dL (ref <150)
VLDL: 28 mg/dL (ref <30)

## 2016-04-05 LAB — TSH: TSH: 0.53 mIU/L

## 2016-04-05 LAB — MAGNESIUM: Magnesium: 2.1 mg/dL (ref 1.5–2.5)

## 2016-04-06 LAB — VITAMIN D 25 HYDROXY (VIT D DEFICIENCY, FRACTURES): VIT D 25 HYDROXY: 73 ng/mL (ref 30–100)

## 2016-04-06 LAB — HEMOGLOBIN A1C
Hgb A1c MFr Bld: 5.4 % (ref <5.7)
Mean Plasma Glucose: 108 mg/dL

## 2016-04-06 LAB — INSULIN, RANDOM: Insulin: 5.2 u[IU]/mL (ref 2.0–19.6)

## 2016-04-17 ENCOUNTER — Other Ambulatory Visit: Payer: Self-pay | Admitting: Physician Assistant

## 2016-04-17 DIAGNOSIS — G501 Atypical facial pain: Secondary | ICD-10-CM

## 2016-04-17 DIAGNOSIS — G44329 Chronic post-traumatic headache, not intractable: Secondary | ICD-10-CM

## 2016-04-17 DIAGNOSIS — S02402S Zygomatic fracture, unspecified, sequela: Secondary | ICD-10-CM

## 2016-04-17 MED ORDER — TRAMADOL HCL 50 MG PO TABS: ORAL_TABLET | ORAL | 1 refills | Status: DC

## 2016-05-28 ENCOUNTER — Other Ambulatory Visit: Payer: Self-pay

## 2016-05-28 MED ORDER — RANITIDINE HCL 300 MG PO TABS: ORAL_TABLET | ORAL | 0 refills | Status: DC

## 2016-06-04 ENCOUNTER — Ambulatory Visit (INDEPENDENT_AMBULATORY_CARE_PROVIDER_SITE_OTHER): Payer: Self-pay | Admitting: Orthopedic Surgery

## 2016-06-08 ENCOUNTER — Ambulatory Visit (INDEPENDENT_AMBULATORY_CARE_PROVIDER_SITE_OTHER): Payer: PPO | Admitting: Family

## 2016-06-08 ENCOUNTER — Encounter (INDEPENDENT_AMBULATORY_CARE_PROVIDER_SITE_OTHER): Payer: Self-pay

## 2016-06-08 ENCOUNTER — Ambulatory Visit (INDEPENDENT_AMBULATORY_CARE_PROVIDER_SITE_OTHER): Payer: Self-pay | Admitting: Orthopedic Surgery

## 2016-06-08 ENCOUNTER — Ambulatory Visit (INDEPENDENT_AMBULATORY_CARE_PROVIDER_SITE_OTHER): Payer: Self-pay

## 2016-06-08 ENCOUNTER — Encounter (INDEPENDENT_AMBULATORY_CARE_PROVIDER_SITE_OTHER): Payer: Self-pay | Admitting: Family

## 2016-06-08 DIAGNOSIS — M7541 Impingement syndrome of right shoulder: Secondary | ICD-10-CM | POA: Diagnosis not present

## 2016-06-08 DIAGNOSIS — M25561 Pain in right knee: Secondary | ICD-10-CM | POA: Diagnosis not present

## 2016-06-08 DIAGNOSIS — G8929 Other chronic pain: Secondary | ICD-10-CM | POA: Diagnosis not present

## 2016-06-08 MED ORDER — LIDOCAINE HCL 1 % IJ SOLN
5.0000 mL | INTRAMUSCULAR | Status: AC | PRN
  Administered 2016-06-08: 5 mL

## 2016-06-08 MED ORDER — METHYLPREDNISOLONE ACETATE 40 MG/ML IJ SUSP
40.0000 mg | INTRAMUSCULAR | Status: AC | PRN
  Administered 2016-06-08: 40 mg via INTRA_ARTICULAR

## 2016-06-08 NOTE — Progress Notes (Signed)
Office Visit Note   Patient: Debra White           Date of Birth: [birth year]           MRN: [id number] Visit Date: 06/08/2016              Requested by: Unk Pinto, MD [address] [place], [place] [address] PCP: Alesia Richards, MD   Assessment & Plan: Visit Diagnoses:  1. Chronic pain of right knee     Plan: Injection today. Will follow up in 4 more weeks. Will discuss arthroscopy of right shoulder at follow up if inadequate relief with injection.   Follow-Up Instructions: Return in about 4 weeks (around 07/06/2016).   Orders:  Orders Placed This Encounter  Procedures  . Large Joint Injection/Arthrocentesis  . Large Joint Injection/Arthrocentesis  . XR Knee 1-2 Views Right   No orders of the defined types were placed in this encounter.     Procedures: Large Joint Inj Date/Time: 06/08/2016 10:00 AM Performed by: Dondra Prader RENEE Authorized by: Dondra Prader RENEE   Consent Given by:  Patient Site marked: the procedure site was marked   Timeout: prior to procedure the correct patient, procedure, and site was verified   Indications:  Pain and diagnostic evaluation Location:  Knee Site:  R knee Needle Size:  22 G Needle Length:  1.5 inches Ultrasound Guidance: No   Fluoroscopic Guidance: No   Arthrogram: No   Medications:  5 mL lidocaine 1 %; 40 mg methylPREDNISolone acetate 40 MG/ML Aspiration Attempted: No   Patient tolerance:  Patient tolerated the procedure well with no immediate complications Large Joint Inj Date/Time: 06/08/2016 10:00 AM Performed by: Dondra Prader RENEE Authorized by: Dondra Prader RENEE   Consent Given by:  Patient Site marked: the procedure site was marked   Timeout: prior to procedure the correct patient, procedure, and site was verified   Indications:  Pain and diagnostic evaluation Location:  Shoulder Prep: patient was prepped and draped in usual sterile fashion   Needle Size:  22 G Needle Length:   1.5 inches Ultrasound Guidance: No   Fluoroscopic Guidance: No   Arthrogram: No   Medications:  5 mL lidocaine 1 %; 40 mg methylPREDNISolone acetate 40 MG/ML Aspiration Attempted: No   Patient tolerance:  Patient tolerated the procedure well with no immediate complications     Clinical Data: No additional findings.   Subjective: Chief Complaint  Patient presents with  . Right Knee - Pain  . Right Shoulder - Pain    Patient is an 82 year old woman who presents today for two separate issues, chronic right shoulder and knee pain. She complains of persistent right shoulder and knee pain for several months. She complains of right knee giving way, swelling, and global aching. Has no known issues with the knee. She has had numerous injections for right shoulder impingement over the years. States would like to defer surgical intervention as she is the primary caregiver for her husband. She was most recently injected by Dr. Durward Fortes 12/30/15. She currently takes tramadol for her pain with moderate relief.    Review of Systems  Constitutional: Negative for chills and fever.  Musculoskeletal: Positive for arthralgias.     Objective: Vital Signs: There were no vitals taken for this visit.  Physical Exam  Constitutional: She is oriented to person, place, and time. She appears well-developed and well-nourished.  Pulmonary/Chest: Effort normal.  Musculoskeletal:       Right knee: She  exhibits no effusion.  Neurological: She is alert and oriented to person, place, and time.  Psychiatric: She has a normal mood and affect.  Nursing note reviewed.   Right Knee Exam   Tenderness  The patient is experiencing tenderness in the lateral joint line and medial joint line.  Range of Motion  The patient has normal right knee ROM.  Tests  Varus: negative Valgus: negative  Other  Erythema: absent Swelling: mild Other tests: no effusion present   Right Shoulder Exam   Tenderness    The patient is experiencing tenderness in the biceps tendon.  Range of Motion  The patient has normal right shoulder ROM.  Muscle Strength  The patient has normal right shoulder strength.  Tests  Hawkin's test: positive Impingement: positive  Other  Erythema: absent      Specialty Comments:  No specialty comments available.  Imaging: No results found.   PMFS History: Patient Active Problem List   Diagnosis Date Noted  . COPD (chronic obstructive pulmonary disease) with emphysema ([place]) 12/21/2015  . 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 Osteoporosis 12/21/2015  . Mild malnutrition ([place]) 05/11/2015  . Encounter for Medicare annual wellness exam 04/08/2015  . Prediabetes 01/27/2014  . Medication management 01/27/2014  . 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 Vitamin D deficiency 07/08/2013  . Hypertension   . Hyperlipidemia   . GERD   . Headache, post-traumatic, chronic 09/20/2011  . Fracture, zygoma closed ([place]) 07/13/2011   Past Medical History:  Diagnosis Date  . Anxiety   . Arthritis   . Blood transfusion   . Candida esophagitis ([place]) 2013   EGD  . Cataract   . Chronic kidney disease    STONES  . Diverticulosis of colon (without mention of hemorrhage) 2007   Colonoscopy  . Dysrhythmia    RX  . Emphysema of lung ([place])   . Family history of colon cancer    sister  . Fibromyalgia   . GERD (gastroesophageal reflux disease)   . Headache(784.0)   . Hyperlipemia   . Hypertension   . Kidney stones     Family History  Problem Relation Age of Onset  . Heart disease Mother   . Heart disease Father   . Breast cancer Sister   . Colon cancer Sister   . Cancer Sister     breast  . Cancer Sister     colon  . Cancer Sister     colon  . Hypertension Daughter   . Hyperlipidemia Daughter     Past Surgical History:  Procedure Laterality Date  . ABDOMINAL HYSTERECTOMY    . BACK SURGERY     X2  . BACK SURGERY    . CERVICAL DISC SURGERY    . COLONOSCOPY     . FACIAL FRACTURE SURGERY    . HAND SURGERY     BIL   . ORIF TRIPOD FRACTURE  07/13/2011   Procedure: OPEN REDUCTION INTERNAL FIXATION (ORIF) TRIPOD FRACTURE;  Surgeon: Tyson Alias, MD;  Location: [facility];  Service: ENT;  Laterality: Right;  ORIF RIGHT ZYGOMA, ORBITAL FLOOR EXPLORATION WITH FROST STITCH (TEMPORARY TARSORRHAPHY)  . SHOULDER ARTHROSCOPY W/ ROTATOR CUFF REPAIR     LFT   Social History   Occupational History  . Retired Retired   Social History Main Topics  . Smoking status: Never Smoker  . Smokeless tobacco: Never Used  . Alcohol use No  . Drug use: No  . Sexual activity: Not on file  Comment: HYSTER

## 2016-06-26 ENCOUNTER — Encounter: Payer: Self-pay | Admitting: Internal Medicine

## 2016-06-26 ENCOUNTER — Ambulatory Visit (INDEPENDENT_AMBULATORY_CARE_PROVIDER_SITE_OTHER): Payer: PPO | Admitting: Internal Medicine

## 2016-06-26 VITALS — BP 140/68 | HR 64 | Temp 97.8°F | Resp 14 | Ht 64.0 in | Wt 110.0 lb

## 2016-06-26 DIAGNOSIS — J320 Chronic maxillary sinusitis: Secondary | ICD-10-CM

## 2016-06-26 MED ORDER — AZITHROMYCIN 250 MG PO TABS: ORAL_TABLET | ORAL | 0 refills | Status: DC

## 2016-06-26 MED ORDER — DEXAMETHASONE SODIUM PHOSPHATE 10 MG/ML IJ SOLN
10.0000 mg | Freq: Once | INTRAMUSCULAR | Status: AC
  Administered 2016-06-26: 10 mg via INTRAMUSCULAR

## 2016-06-26 MED ORDER — FLUTICASONE PROPIONATE 50 MCG/ACT NA SUSP: 2.0000 | Freq: Every day | NASAL | 0 refills | Status: DC

## 2016-06-26 NOTE — Patient Instructions (Signed)
Please take the zpak until it is completely gone.    Please  Use 2 sprays of flonase per nostril right before you go to bed.  This will shrink the swelling in your nose.  Please look down at your toes when you are spraying it.    Please use saline to help rinse your nose out.  Please make sure you consistently take your gabapentin for your facial nerve pain.  You can take tylenol as you need it for the headache.    Please call the office in 48 hours if you have no improvement.

## 2016-06-26 NOTE — Progress Notes (Signed)
Subjective:    Patient ID: Debra White, female    DOB: 02-Oct-1933, 83 y.o.   MRN: [id number]  HPI   Patient presents to the office for evaluation of right sided headache which has been bothering her for the last 4 days.  She reports that she has more concentrated pain on the right temporal region which has been gradually worsening.  She reports that the pain does improve but then returns.  She reports that she has been taking gabapentin and also has been taking tramadol with some relief of the pain.  She reports that her dentist has told her that she does have TMJ.  She reports that she has not been having vision changes.  She reports that she has not had fevers, chills, neck pain.      Review of Systems  Constitutional: Negative for chills, fatigue and fever.  HENT: Positive for congestion, ear pain, nosebleeds (minimal blood when she blows her nose.) and rhinorrhea (chronic rhinorrhea). Negative for dental problem, ear discharge, postnasal drip, sore throat, trouble swallowing and voice change.   Eyes: Negative for visual disturbance.  Respiratory: Negative for chest tightness, shortness of breath and wheezing.   Cardiovascular: Negative for chest pain and palpitations.  Gastrointestinal: Negative for abdominal pain, constipation, diarrhea, nausea and vomiting.  Musculoskeletal: Negative for neck pain.  Neurological: Positive for headaches. Negative for facial asymmetry, speech difficulty, weakness, light-headedness and numbness.       Objective:   Physical Exam  Constitutional: She is oriented to person, place, and time. She appears well-developed and well-nourished. No distress.  HENT:  Head: Normocephalic.  Nose: Right sinus exhibits maxillary sinus tenderness and frontal sinus tenderness. Left sinus exhibits no maxillary sinus tenderness and no frontal sinus tenderness.  Mouth/Throat: Oropharynx is clear and moist. No oropharyngeal exudate.  Bilateral TMJ tenderness to  palpation with grinding with opening and closing.    Eyes: Conjunctivae are normal. No scleral icterus.  Neck: Normal range of motion. Neck supple. No JVD present. No thyromegaly present.  Cardiovascular: Normal rate, regular rhythm, normal heart sounds and intact distal pulses.  Exam reveals no gallop and no friction rub.   No murmur heard. Pulmonary/Chest: Effort normal and breath sounds normal. No respiratory distress. She has no wheezes. She has no rales. She exhibits no tenderness.  Abdominal: Soft. Bowel sounds are normal. She exhibits no distension and no mass. There is no tenderness. There is no rebound and no guarding.  Musculoskeletal: Normal range of motion.  Lymphadenopathy:    She has no cervical adenopathy.  Neurological: She is alert and oriented to person, place, and time. She has normal strength. No cranial nerve deficit or sensory deficit. Coordination normal. GCS eye subscore is 4. GCS verbal subscore is 5. GCS motor subscore is 6.  Skin: Skin is warm and dry. She is not diaphoretic.  Psychiatric: She has a normal mood and affect. Her behavior is normal. Judgment and thought content normal.  Nursing note and vitals reviewed.   Vitals:   06/26/16 1352  BP: 140/68  Pulse: 64  Resp: 14  Temp: 97.8 F (36.6 C)       Assessment & Plan:    1. Chronic maxillary sinusitis -if no improvement than will need to follow-up with [facility] who did her maxillofacial surgery in the first place.  - azithromycin (ZITHROMAX Z-PAK) 250 MG tablet; 2 po day one, then 1 daily x 4 days  Dispense: 6 tablet; Refill: 0 - dexamethasone (DECADRON) injection 10 mg;  Inject 1 mL (10 mg total) into the muscle once. - fluticasone (FLONASE) 50 MCG/ACT nasal spray; Place 2 sprays into both nostrils daily.  Dispense: 16 g; Refill: 0

## 2016-07-03 ENCOUNTER — Other Ambulatory Visit: Payer: Self-pay | Admitting: Internal Medicine

## 2016-07-03 DIAGNOSIS — N6310 Unspecified lump in the right breast, unspecified quadrant: Secondary | ICD-10-CM

## 2016-07-04 ENCOUNTER — Other Ambulatory Visit: Payer: Self-pay | Admitting: Internal Medicine

## 2016-07-04 NOTE — Telephone Encounter (Signed)
Klonopin was called into pharmacy.

## 2016-07-05 ENCOUNTER — Other Ambulatory Visit: Payer: Self-pay | Admitting: Internal Medicine

## 2016-07-06 ENCOUNTER — Ambulatory Visit: Payer: Self-pay | Admitting: Internal Medicine

## 2016-07-06 ENCOUNTER — Ambulatory Visit (INDEPENDENT_AMBULATORY_CARE_PROVIDER_SITE_OTHER): Payer: PPO | Admitting: Physician Assistant

## 2016-07-06 ENCOUNTER — Encounter: Payer: Self-pay | Admitting: Physician Assistant

## 2016-07-06 VITALS — BP 126/76 | HR 74 | Temp 97.7°F | Resp 14 | Ht 64.0 in | Wt 110.0 lb

## 2016-07-06 DIAGNOSIS — Z79899 Other long term (current) drug therapy: Secondary | ICD-10-CM

## 2016-07-06 DIAGNOSIS — H9203 Otalgia, bilateral: Secondary | ICD-10-CM

## 2016-07-06 DIAGNOSIS — I771 Stricture of artery: Secondary | ICD-10-CM

## 2016-07-06 DIAGNOSIS — I10 Essential (primary) hypertension: Secondary | ICD-10-CM | POA: Diagnosis not present

## 2016-07-06 DIAGNOSIS — E782 Mixed hyperlipidemia: Secondary | ICD-10-CM

## 2016-07-06 DIAGNOSIS — H6123 Impacted cerumen, bilateral: Secondary | ICD-10-CM

## 2016-07-06 DIAGNOSIS — E559 Vitamin D deficiency, unspecified: Secondary | ICD-10-CM

## 2016-07-06 DIAGNOSIS — E441 Mild protein-calorie malnutrition: Secondary | ICD-10-CM

## 2016-07-06 DIAGNOSIS — J439 Emphysema, unspecified: Secondary | ICD-10-CM

## 2016-07-06 DIAGNOSIS — G501 Atypical facial pain: Secondary | ICD-10-CM

## 2016-07-06 LAB — CBC WITH DIFFERENTIAL/PLATELET
BASOS ABS: 0 {cells}/uL (ref 0–200)
BASOS PCT: 0 %
EOS ABS: 72 {cells}/uL (ref 15–500)
Eosinophils Relative: 1 %
HEMATOCRIT: 39.1 % (ref 35.0–45.0)
HEMOGLOBIN: 12.9 g/dL (ref 11.7–15.5)
LYMPHS ABS: 2232 {cells}/uL (ref 850–3900)
Lymphocytes Relative: 31 %
MCH: 31.2 pg (ref 27.0–33.0)
MCHC: 33 g/dL (ref 32.0–36.0)
MCV: 94.4 fL (ref 80.0–100.0)
MONO ABS: 648 {cells}/uL (ref 200–950)
MPV: 9.8 fL (ref 7.5–12.5)
Monocytes Relative: 9 %
NEUTROS ABS: 4248 {cells}/uL (ref 1500–7800)
Neutrophils Relative %: 59 %
PLATELETS: 265 10*3/uL (ref 140–400)
RBC: 4.14 MIL/uL (ref 3.80–5.10)
RDW: 13.4 % (ref 11.0–15.0)
WBC: 7.2 10*3/uL (ref 3.8–10.8)

## 2016-07-06 LAB — BASIC METABOLIC PANEL WITH GFR
BUN: 12 mg/dL (ref 7–25)
CALCIUM: 9.4 mg/dL (ref 8.6–10.4)
CHLORIDE: 95 mmol/L — AB (ref 98–110)
CO2: 27 mmol/L (ref 20–31)
CREATININE: 0.73 mg/dL (ref 0.60–0.88)
GFR, Est African American: >89 mL/min (ref >=60)
GFR, Est Non African American: 78 mL/min (ref >=60)
Glucose, Bld: 82 mg/dL (ref 65–99)
Potassium: 4.4 mmol/L (ref 3.5–5.3)
SODIUM: 131 mmol/L — AB (ref 135–146)

## 2016-07-06 LAB — HEPATIC FUNCTION PANEL
ALT: 12 U/L (ref 6–29)
AST: 18 U/L (ref 10–35)
Albumin: 4.2 g/dL (ref 3.6–5.1)
Alkaline Phosphatase: 57 U/L (ref 33–130)
BILIRUBIN DIRECT: 0.1 mg/dL (ref <=0.2)
BILIRUBIN TOTAL: 0.4 mg/dL (ref 0.2–1.2)
Indirect Bilirubin: 0.3 mg/dL (ref 0.2–1.2)
Total Protein: 7.4 g/dL (ref 6.1–8.1)

## 2016-07-06 LAB — LIPID PANEL
CHOL/HDL RATIO: 1.9 ratio (ref <5.0)
CHOLESTEROL: 183 mg/dL (ref <200)
HDL: 95 mg/dL (ref >50)
LDL Cholesterol: 58 mg/dL (ref <100)
Triglycerides: 148 mg/dL (ref <150)
VLDL: 30 mg/dL (ref <30)

## 2016-07-06 LAB — TSH: TSH: 0.56 m[IU]/L

## 2016-07-06 LAB — MAGNESIUM: Magnesium: 1.9 mg/dL (ref 1.5–2.5)

## 2016-07-06 MED ORDER — KETOROLAC TROMETHAMINE 60 MG/2ML IM SOLN: 60.0000 mg | Freq: Once | INTRAMUSCULAR | Status: DC

## 2016-07-06 NOTE — Patient Instructions (Signed)
   Trigeminal Neuralgia Trigeminal neuralgia is a nerve disorder that causes attacks of severe facial pain. The attacks last from a few seconds to several minutes. They can happen for days, weeks, or months and then go away for months or years. Trigeminal neuralgia is also called tic douloureux. What are the causes? This condition is caused by damage to a nerve in the face that is called the trigeminal nerve. An attack can be triggered by:  Talking.  Chewing.  Putting on makeup.  Washing your face.  Shaving your face.  Brushing your teeth.  Touching your face. What increases the risk? This condition is more likely to develop in:  Women.  People who are 49 years of age or older. What are the signs or symptoms? The main symptom of this condition is pain in the jaw, lips, eyes, nose, scalp, forehead, and face. The pain may be intense, stabbing, electric, or shock-like. How is this diagnosed? This condition is diagnosed with a physical exam. A CT scan or MRI may be done to rule out other conditions that can cause facial pain. How is this treated? This condition may be treated with:  Avoiding the things that trigger your attacks.  Pain medicine.  Surgery. This may be done in severe cases if other medical treatment does not provide relief. Follow these instructions at home:  Take over-the-counter and prescription medicines only as told by your health care provider.  If you wish to get pregnant, talk with your health care provider before you start trying to get pregnant.  Avoid the things that trigger your attacks. It may help to:  Chew on the unaffected side of your mouth.  Avoid touching your face.  Avoid blasts of hot or cold air. Contact a health care provider if:  Your pain medicine is not helping.  You develop new, unexplained symptoms, such as:  Double vision.  Facial weakness.  Changes in hearing or balance.  You become pregnant. Get help right away  if:  Your pain is unbearable, and your pain medicine does not help. This information is not intended to replace advice given to you by your health care provider. Make sure you discuss any questions you have with your health care provider. Document Released: 06/08/2000 Document Revised: 02/12/2016 Document Reviewed: 10/04/2014 Elsevier Interactive Patient Education  2017 [employer].

## 2016-07-06 NOTE — Progress Notes (Signed)
Assessment and Plan:  Hypertension - soft BP today, no symptoms but suggest cutting atenolol in half, monitor blood pressure at home. Continue DASH diet.  Reminder to go to the ER if any CP, SOB, nausea, dizziness, severe HA, changes vision/speech, left arm numbness and tingling, and jaw pain.  Cholesterol - Continue diet and exercise. Check cholesterol.   Pre-diabetes -Continue diet and exercise. Check A1C- long discussion about preDM, has some anxiety about food due to this, reassured patient that only PreDM and while we want her to eat healthy that eating is much more important.   Vitamin D Def - check level and continue medications.   Malnutrition - get on boost originial.   Tortuous aorta (HCC) Control blood pressure, cholesterol, glucose, increase exercise.  -     Lipid panel  Pulmonary emphysema, unspecified emphysema type (HCC) At baseline, control triggers  Atypical facial pain -     Sedimentation rate -     C-reactive protein -     ketorolac (TORADOL) injection 60 mg; Inject 2 mLs (60 mg total) into the muscle once.  Ear pain, bilateral due to Bilateral hearing loss due to cerumen impaction Cleaned out in the office   Continue diet and meds as discussed. Further disposition pending results of labs.  HPI 83 y.o. female  presents for 6 month follow up with hypertension, hyperlipidemia, prediabetes and vitamin D. Her blood pressure has been controlled at home, today their BP is BP: 126/76 She does not workout. She denies chest pain, shortness of breath, dizziness.  She is on cholesterol medication and denies myalgias. Her cholesterol is at goal. The cholesterol last visit was:   Lab Results  Component Value Date   CHOL 152 04/05/2016   HDL 80 04/05/2016   LDLCALC 44 04/05/2016   TRIG 139 04/05/2016   CHOLHDL 1.9 04/05/2016   She has been working on diet and exercise for prediabetes, and denies polydipsia, polyuria and visual disturbances. Last A1C in the office  was:  Lab Results  Component Value Date   HGBA1C 5.4 04/05/2016   Patient is on Vitamin D supplement.   Lab Results  Component Value Date   VD25OH 73 04/05/2016     She has facial pain/neuraliga, continues to complains about right facial pain, Ha in the morning, and right eye pain, no change in vision, no change in speech, no weakness, has seen Dr. Delice Lesch in the past but does not want to see again, and is on lidocaine ointment, klonopin BID, tramadol bID and gabapentin 300mg  4 a day which helps some.  Bilateral ear pain with cerumen impaction.  She is very frustrated with her weight, she has been on DM boost but has not been gaining weight.  Wt Readings from Last 3 Encounters:  07/06/16 110 lb (49.9 kg)  06/26/16 110 lb (49.9 kg)  04/05/16 112 lb 6.4 oz (51 kg)    Current Medications:  Current Outpatient Prescriptions on File Prior to Visit  Medication Sig Dispense Refill  . atenolol (TENORMIN) 100 MG tablet TAKE 1 TABLET BY MOUTH EVERY MORNING 90 tablet 1  . cholecalciferol (VITAMIN D) 1000 UNITS tablet Take 5,000 Units by mouth daily.     . clonazePAM (KLONOPIN) 1 MG tablet TAKE 1/2-1 TABLET BY MOUTH UP TO 3 TIMESDAILY IF NEEDED FOR NERVES 90 tablet 1  . diclofenac sodium (VOLTAREN) 1 % GEL Apply 4 g topically 4 (four) times daily. 100 g 99  . fluticasone (FLONASE) 50 MCG/ACT nasal spray Place 2 sprays  into both nostrils daily. 16 g 0  . gabapentin (NEURONTIN) 300 MG capsule TAKE 1 CAPSULE BY MOUTH 3-4 TIMES DAILY FOR FACIAL PAIN 360 capsule 1  . hyoscyamine (LEVSIN SL) 0.125 MG SL tablet Dissolve one tablet by mouth every 4-6 hours as needed 60 tablet 3  . losartan (COZAAR) 100 MG tablet TAKE 1/2 TABLET BY MOUTH DAILY 90 tablet 1  . pravastatin (PRAVACHOL) 40 MG tablet TAKE 1 TABLET BY MOUTH ONCE DAILY 90 tablet 2  . ranitidine (ZANTAC) 300 MG tablet TAKE 1 TABLET BY MOUTH TWICE DAILY AS NEEDED FOR ACID INDIGESTION ANDREFLUX 180 tablet 0  . traMADol (ULTRAM) 50 MG tablet TAKE 1  TABLET BY MOUTH 4 TIMES DAILY AS NEEDED FOR PAIN 120 tablet 1  . [DISCONTINUED] omeprazole (PRILOSEC OTC) 20 MG tablet Take 20 mg by mouth daily.     No current facility-administered medications on file prior to visit.    Medical History:  Past Medical History:  Diagnosis Date  . Anxiety   . Arthritis   . Blood transfusion   . Candida esophagitis ([place]) 2013   EGD  . Cataract   . Chronic kidney disease    STONES  . Diverticulosis of colon (without mention of hemorrhage) 2007   Colonoscopy  . Dysrhythmia    RX  . Emphysema of lung ([place])   . Family history of colon cancer    sister  . Fibromyalgia   . GERD (gastroesophageal reflux disease)   . Headache(784.0)   . Hyperlipemia   . Hypertension   . Kidney stones    Allergies:  Allergies  Allergen Reactions  . Latex Hives    TONGUE HAD BLISTERS WHEN HAD DENTAL SURGERY  . Amoxicillin Nausea And Vomiting  . Chlorhexidine Gluconate Itching  . Aspirin     Gi upset  . Barbiturates   . Celebrex [Celecoxib]   . Codeine Itching  . Evista [Raloxifene]   . Lipitor [Atorvastatin]   . Morphine And Related   . Oruvail [Ketoprofen]   . Paraffin   . Prochlorperazine Other (See Comments)    Uncontrolled shaking  . Proloprim [Trimethoprim]   . Vibra-Tab [Doxycycline]   . Vioxx [Rofecoxib]     edema  . Betadine [Povidone Iodine] Rash  . Caffeine Palpitations   Review of Systems:  Review of Systems  Constitutional: Positive for malaise/fatigue and weight loss. Negative for chills, diaphoresis and fever.  HENT: Negative for congestion, ear discharge, ear pain, hearing loss, nosebleeds, sore throat and tinnitus.   Eyes: Negative.   Respiratory: Negative.  Negative for stridor.   Cardiovascular: Negative.   Gastrointestinal: Negative.   Genitourinary: Negative.   Musculoskeletal: Positive for back pain and myalgias. Negative for falls, joint pain and neck pain.  Skin: Negative.   Neurological: Positive for headaches. Negative  for dizziness, tingling, tremors, sensory change, speech change, focal weakness, seizures, loss of consciousness and weakness.  Psychiatric/Behavioral: Negative.     Family history- Review and unchanged Social history- Review and unchanged Physical Exam: BP 126/76   Pulse 74   Temp 97.7 F (36.5 C)   Resp 14   Ht 5\' 4"  (1.626 m)   Wt 110 lb (49.9 kg)   SpO2 97%   BMI 18.88 kg/m  Wt Readings from Last 3 Encounters:  07/06/16 110 lb (49.9 kg)  06/26/16 110 lb (49.9 kg)  04/05/16 112 lb 6.4 oz (51 kg)   General Appearance: Thin,frail, in no apparent distress. Eyes: PERRLA, EOMs, conjunctiva no swelling or erythema  Sinuses: No Frontal/maxillary tenderness ENT/Mouth: Ext aud canals clear, TMs without erythema, bulging. No erythema, swelling, or exudate on post pharynx.  Tonsils not swollen or erythematous. Hearing normal. + tenderness right temple/head Neck: Supple, thyroid normal.  Respiratory: Respiratory effort normal, BS equal bilaterally without rales, rhonchi, wheezing or stridor.  Cardio: RRR with no MRGs. Brisk peripheral pulses without edema.  Abdomen: Soft, + BS.  Non tender, no guarding, rebound, hernias, masses. Lymphatics: Non tender without lymphadenopathy.  Musculoskeletal: Full ROM, 5/5 strength, normal gait.  Skin: Warm, dry without rashes, lesions, ecchymosis.  Neuro: Cranial nerves intact. Normal muscle tone, no cerebellar symptoms. Sensation intact.  Psych: Awake and oriented X 3, normal affect, Insight and Judgment appropriate.    Vicie Mutters, PA-C 11:46 AM [facility]

## 2016-07-07 LAB — SEDIMENTATION RATE: Sed Rate: 4 mm/hr (ref 0–30)

## 2016-07-09 ENCOUNTER — Other Ambulatory Visit: Payer: Self-pay | Admitting: Internal Medicine

## 2016-07-09 LAB — C-REACTIVE PROTEIN: CRP: 3.4 mg/L (ref <8.0)

## 2016-07-09 NOTE — Progress Notes (Signed)
LVM for pt to return office call for LAB results.

## 2016-07-12 ENCOUNTER — Ambulatory Visit (INDEPENDENT_AMBULATORY_CARE_PROVIDER_SITE_OTHER): Payer: PPO | Admitting: Orthopedic Surgery

## 2016-07-16 ENCOUNTER — Telehealth: Payer: Self-pay | Admitting: *Deleted

## 2016-07-16 ENCOUNTER — Ambulatory Visit (INDEPENDENT_AMBULATORY_CARE_PROVIDER_SITE_OTHER): Payer: PPO | Admitting: *Deleted

## 2016-07-16 DIAGNOSIS — J320 Chronic maxillary sinusitis: Secondary | ICD-10-CM

## 2016-07-16 MED ORDER — DEXAMETHASONE SODIUM PHOSPHATE 100 MG/10ML IJ SOLN
10.0000 mg | Freq: Once | INTRAMUSCULAR | Status: AC
  Administered 2016-07-16: 10 mg via INTRAMUSCULAR

## 2016-07-16 NOTE — Telephone Encounter (Signed)
A call was returned to the patient in regard to her BP, which she had reported as 114/111.  The patient rechecked her BP at 5:10 pm and it was 110/80 and her pulse was 86.  Per Vicie Mutters, PA, the patient was advised to increase her fluids this PM and check her BP before taking her BP Medication in the AM and call with the reading. Patient states she will follow the instructions.

## 2016-07-16 NOTE — Progress Notes (Signed)
Patient presents for Dexamethasone injection.  Received one 06/26/2016 and states it offered her much relief to her symptoms.  Per Vicie Mutters, PA-C orders, Dexamethasone 1 mg ordered and administered.  Patient tolerated well and advised her to f/u in the office if any symptoms persist or increase.  Patient expressed understanding.

## 2016-07-17 ENCOUNTER — Telehealth: Payer: Self-pay | Admitting: *Deleted

## 2016-07-17 NOTE — Telephone Encounter (Signed)
Patient called and reported her BP was 118/62 this AM.  Per Vicie Mutters, the patient should continue her Medications the same.

## 2016-08-07 ENCOUNTER — Ambulatory Visit
Admission: RE | Admit: 2016-08-07 | Discharge: 2016-08-07 | Disposition: A | Discharge status: Home / Self Care | Payer: PPO | Source: Ambulatory Visit | Attending: Internal Medicine | Admitting: Internal Medicine

## 2016-08-07 DIAGNOSIS — N6310 Unspecified lump in the right breast, unspecified quadrant: Secondary | ICD-10-CM

## 2016-08-07 DIAGNOSIS — R928 Other abnormal and inconclusive findings on diagnostic imaging of breast: Secondary | ICD-10-CM | POA: Diagnosis not present

## 2016-08-07 DIAGNOSIS — N6001 Solitary cyst of right breast: Secondary | ICD-10-CM | POA: Diagnosis not present

## 2016-08-13 ENCOUNTER — Telehealth: Payer: Self-pay | Admitting: Physician Assistant

## 2016-08-13 DIAGNOSIS — S02402S Zygomatic fracture, unspecified, sequela: Secondary | ICD-10-CM

## 2016-08-13 DIAGNOSIS — G44329 Chronic post-traumatic headache, not intractable: Secondary | ICD-10-CM

## 2016-08-13 DIAGNOSIS — G501 Atypical facial pain: Secondary | ICD-10-CM

## 2016-08-13 MED ORDER — TRAMADOL HCL 50 MG PO TABS: ORAL_TABLET | ORAL | 1 refills | Status: DC

## 2016-08-13 NOTE — Telephone Encounter (Signed)
-----   Message from [name], [place] sent at 08/13/2016 12:25 PM EST ----- Regarding: Rx needed Pt would like a refill on her Tramadol.

## 2016-08-13 NOTE — Telephone Encounter (Signed)
Tramadol was called into pharmacy @ 2:08pm on 19 th Feb 10 by DD.

## 2016-08-14 DIAGNOSIS — R51 Headache: Secondary | ICD-10-CM | POA: Diagnosis not present

## 2016-08-14 DIAGNOSIS — M26623 Arthralgia of bilateral temporomandibular joint: Secondary | ICD-10-CM | POA: Diagnosis not present

## 2016-08-23 DIAGNOSIS — G43109 Migraine with aura, not intractable, without status migrainosus: Secondary | ICD-10-CM | POA: Diagnosis not present

## 2016-08-30 ENCOUNTER — Other Ambulatory Visit: Payer: Self-pay | Admitting: Otolaryngology

## 2016-08-30 DIAGNOSIS — R519 Headache, unspecified: Secondary | ICD-10-CM

## 2016-08-30 DIAGNOSIS — R51 Headache: Principal | ICD-10-CM

## 2016-08-31 DIAGNOSIS — H02423 Myogenic ptosis of bilateral eyelids: Secondary | ICD-10-CM | POA: Diagnosis not present

## 2016-09-15 ENCOUNTER — Ambulatory Visit
Admission: RE | Admit: 2016-09-15 | Discharge: 2016-09-15 | Disposition: A | Discharge status: Home / Self Care | Payer: PPO | Source: Ambulatory Visit | Attending: Otolaryngology | Admitting: Otolaryngology

## 2016-09-15 DIAGNOSIS — R51 Headache: Principal | ICD-10-CM

## 2016-09-15 DIAGNOSIS — R519 Headache, unspecified: Secondary | ICD-10-CM

## 2016-09-15 MED ORDER — GADOBENATE DIMEGLUMINE 529 MG/ML IV SOLN
10.0000 mL | Freq: Once | INTRAVENOUS | Status: AC | PRN
  Administered 2016-09-15: 10 mL via INTRAVENOUS

## 2016-09-25 ENCOUNTER — Ambulatory Visit (INDEPENDENT_AMBULATORY_CARE_PROVIDER_SITE_OTHER): Payer: PPO | Admitting: Orthopedic Surgery

## 2016-09-25 ENCOUNTER — Encounter (INDEPENDENT_AMBULATORY_CARE_PROVIDER_SITE_OTHER): Payer: Self-pay | Admitting: Orthopedic Surgery

## 2016-09-25 VITALS — Ht 64.0 in | Wt 110.0 lb

## 2016-09-25 DIAGNOSIS — M7542 Impingement syndrome of left shoulder: Secondary | ICD-10-CM

## 2016-09-25 DIAGNOSIS — M7541 Impingement syndrome of right shoulder: Secondary | ICD-10-CM

## 2016-09-25 HISTORY — DX: Impingement syndrome of right shoulder: M75.41

## 2016-09-25 HISTORY — DX: Impingement syndrome of left shoulder: M75.42

## 2016-09-25 MED ORDER — LIDOCAINE HCL 1 % IJ SOLN
5.0000 mL | INTRAMUSCULAR | Status: AC | PRN
  Administered 2016-09-25: 5 mL

## 2016-09-25 MED ORDER — METHYLPREDNISOLONE ACETATE 40 MG/ML IJ SUSP
40.0000 mg | INTRAMUSCULAR | Status: AC | PRN
  Administered 2016-09-25: 40 mg via INTRA_ARTICULAR

## 2016-09-25 NOTE — Progress Notes (Signed)
Office Visit Note   Patient: Debra White           Date of Birth: [birth year]           MRN: [id number] Visit Date: 09/25/2016              Requested by: Unk Pinto, MD [address] [place], [place] [address] PCP: Alesia Richards, MD  Chief Complaint  Patient presents with  . Right Shoulder - Pain    Bilateral shoulder pain right greater than left.   . Left Shoulder - Pain      HPI: Patient is a 83 year old woman status post left shoulder arthroscopy presents at this time complaining of impingement symptoms right shoulder greater than left shoulder. She has pain reaching overhead pain reaching behind herself. Patient also states that she's been having global headaches since she has had her cataract surgery. She states she does have a follow-up appointment with a neurologist. Patient complains of shoulder pain with activities of daily living.  Assessment & Plan: Visit Diagnoses:  1. Impingement syndrome of right shoulder   2. Impingement syndrome of left shoulder     Plan: Both shoulders were injected in the posterior portal she tolerated this well she will increase her activities as tolerated follow up as needed.  Follow-Up Instructions: Return if symptoms worsen or fail to improve.   Ortho Exam  Patient is alert, oriented, no adenopathy, well-dressed, normal affect, normal respiratory effort. On examination patient is alert oriented no adenopathy well-dressed normal affect normal respiratory effort she has a normal gait. Examination she has abduction and flexion of about 120 bilaterally. She has internal and external rotation of 90 of both shoulders with pain with Neer and Hawkins impingement test worse on the right than the left. The biceps tendon is tender to palpation bilaterally she has some tenderness to palpation of the before meals joint bilaterally. The scapulothoracic joint is nontender to palpation.  Imaging: No results  found.  Labs: Lab Results  Component Value Date   HGBA1C 5.4 04/05/2016   HGBA1C 5.8 (H) 12/21/2015   HGBA1C 5.8 (H) 09/20/2015   ESRSEDRATE 4 07/06/2016   ESRSEDRATE 22 03/30/2016   ESRSEDRATE 4 05/24/2014   CRP 3.4 07/06/2016   LABURIC 3.4 03/30/2016   REPTSTATUS 09/08/2012 FINAL 09/04/2012   CULT  09/04/2012    ABUNDANT YEAST NO SALMONELLA, SHIGELLA, CAMPYLOBACTER, YERSINIA, OR E.COLI 0157:H7 ISOLATED Note: REDUCED NORMAL FLORA PRESENT   LABORGA NO GROWTH 03/30/2016    Orders:  No orders of the defined types were placed in this encounter.  No orders of the defined types were placed in this encounter.    Procedures: Large Joint Inj Date/Time: 09/25/2016 11:45 AM Performed by: DUDA, MARCUS V Authorized by: Newt Minion   Consent Given by:  Patient Site marked: the procedure site was marked   Timeout: prior to procedure the correct patient, procedure, and site was verified   Indications:  Pain and diagnostic evaluation Location:  Shoulder Site:  L subacromial bursa Prep: patient was prepped and draped in usual sterile fashion   Needle Size:  22 G Needle Length:  1.5 inches Approach:  Posterior Ultrasound Guidance: No   Fluoroscopic Guidance: No   Arthrogram: No   Medications:  5 mL lidocaine 1 %; 40 mg methylPREDNISolone acetate 40 MG/ML Aspiration Attempted: No   Patient tolerance:  Patient tolerated the procedure well with no immediate complications Large Joint Inj Date/Time: 09/25/2016 11:45 AM Performed by: Newt Minion  Authorized by: Newt Minion   Consent Given by:  Patient Site marked: the procedure site was marked   Timeout: prior to procedure the correct patient, procedure, and site was verified   Indications:  Pain and diagnostic evaluation Location:  Shoulder Site:  R subacromial bursa Prep: patient was prepped and draped in usual sterile fashion   Needle Size:  22 G Needle Length:  1.5 inches Approach:  Posterior Ultrasound Guidance: No    Fluoroscopic Guidance: No   Arthrogram: No   Medications:  5 mL lidocaine 1 %; 40 mg methylPREDNISolone acetate 40 MG/ML Aspiration Attempted: No   Patient tolerance:  Patient tolerated the procedure well with no immediate complications    Clinical Data: No additional findings.  ROS:  All other systems negative, except as noted in the HPI. Review of Systems  Objective: Vital Signs: Ht 5\' 4"  (1.626 m)   Wt 110 lb (49.9 kg)   BMI 18.88 kg/m   Specialty Comments:  No specialty comments available.  PMFS History: Patient Active Problem List   Diagnosis Date Noted  . Impingement syndrome of right shoulder 09/25/2016  . Impingement syndrome of left shoulder 09/25/2016  . COPD (chronic obstructive pulmonary disease) with emphysema ([place]) 12/21/2015  . 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 Osteoporosis 12/21/2015  . Mild malnutrition ([place]) 05/11/2015  . Encounter for Medicare annual wellness exam 04/08/2015  . Prediabetes 01/27/2014  . Medication management 01/27/2014  . 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 Vitamin D deficiency 07/08/2013  . Hypertension   . Hyperlipidemia   . GERD   . Headache, post-traumatic, chronic 09/20/2011  . Fracture, zygoma closed ([place]) 07/13/2011   Past Medical History:  Diagnosis Date  . Anxiety   . Arthritis   . Blood transfusion   . Candida esophagitis ([place]) 2013   EGD  . Cataract   . Chronic kidney disease    STONES  . Diverticulosis of colon (without mention of hemorrhage) 2007   Colonoscopy  . Dysrhythmia    RX  . Emphysema of lung ([place])   . Family history of colon cancer    sister  . Fibromyalgia   . GERD (gastroesophageal reflux disease)   . Headache(784.0)   . Hyperlipemia   . Hypertension   . Kidney stones     Family History  Problem Relation Age of Onset  . Heart disease Mother   . Heart disease Father   . Breast cancer Sister   . Colon cancer Sister   . Cancer Sister     breast  . Cancer Sister     colon  .  Cancer Sister     colon  . Hypertension Daughter   . Hyperlipidemia Daughter     Past Surgical History:  Procedure Laterality Date  . ABDOMINAL HYSTERECTOMY    . BACK SURGERY     X2  . BACK SURGERY    . CERVICAL DISC SURGERY    . COLONOSCOPY    . FACIAL FRACTURE SURGERY    . HAND SURGERY     BIL   . ORIF TRIPOD FRACTURE  07/13/2011   Procedure: OPEN REDUCTION INTERNAL FIXATION (ORIF) TRIPOD FRACTURE;  Surgeon: Tyson Alias, MD;  Location: [facility];  Service: ENT;  Laterality: Right;  ORIF RIGHT ZYGOMA, ORBITAL FLOOR EXPLORATION WITH FROST STITCH (TEMPORARY TARSORRHAPHY)  . SHOULDER ARTHROSCOPY W/ ROTATOR CUFF REPAIR     LFT   Social History   Occupational History  . Retired Retired   Science writer History Main  Topics  . Smoking status: Never Smoker  . Smokeless tobacco: Never Used  . Alcohol use No  . Drug use: No  . Sexual activity: Not on file     Comment: HYSTER

## 2016-10-10 ENCOUNTER — Other Ambulatory Visit: Payer: Self-pay

## 2016-10-10 MED ORDER — RANITIDINE HCL 300 MG PO TABS: ORAL_TABLET | ORAL | 0 refills | Status: DC

## 2016-10-15 ENCOUNTER — Encounter (INDEPENDENT_AMBULATORY_CARE_PROVIDER_SITE_OTHER): Payer: Self-pay

## 2016-10-15 ENCOUNTER — Ambulatory Visit (INDEPENDENT_AMBULATORY_CARE_PROVIDER_SITE_OTHER): Payer: PPO | Admitting: Neurology

## 2016-10-15 ENCOUNTER — Encounter: Payer: Self-pay | Admitting: Neurology

## 2016-10-15 VITALS — BP 144/70 | HR 54 | Ht 64.0 in | Wt 111.5 lb

## 2016-10-15 DIAGNOSIS — S02402S Zygomatic fracture, unspecified, sequela: Secondary | ICD-10-CM

## 2016-10-15 DIAGNOSIS — R51 Headache: Secondary | ICD-10-CM

## 2016-10-15 DIAGNOSIS — R519 Headache, unspecified: Secondary | ICD-10-CM

## 2016-10-15 MED ORDER — VENLAFAXINE HCL ER 37.5 MG PO CP24: 37.5000 mg | ORAL_CAPSULE | Freq: Every day | ORAL | 6 refills | Status: DC

## 2016-10-15 MED ORDER — DICLOFENAC SODIUM 1 % TD GEL: 2.0000 g | Freq: Four times a day (QID) | TRANSDERMAL | 6 refills | Status: DC

## 2016-10-15 NOTE — Patient Instructions (Signed)
   Voltaren gel= diclofenac gel as needed for left TMJ joints pain Use thermaCare heating pad for left TMJ joints pain  Continue gabapentin 300 mg tablets, may consider 2 tablets every night before you go to bed, Also add on Effexor XL 37.5 mg every morning for headache, for anxiety,  Continue tramadol 50 mg as needed,  May consider low dose of Aleve 220 mg 1-2 tablets as needed

## 2016-10-15 NOTE — Progress Notes (Signed)
PATIENT: Debra White DOB: 05-Jan-1934  Chief Complaint  Patient presents with  . Headaches    Reports constant facial pain after falling an hitting her face several years ago. Says her headaches started after this injury.  She wakes up with a headache nearly every day.  She is currently using Gabapentin, Clonazepam and Tramadol.  Marland Kitchen PCP    Unk Pinto, MD  . ENT    Jodi Marble, MD - referring MD     HISTORICAL  Debra White is a 83 years old right-handed female seen in refer by  her ENT doctor Jodi Marble for evaluation of headaches, her primary care physician is Dr.lliam [name], initial evaluation was on October 15 2016.  I reviewed and summarized the referring note, she had a history of chronic diverticulitis, kidney stone, lumbar decompression surgery, cervical decompression surgery mary years ago, in 2015, she fell, with multiple right facial fracture, need surgery,    She began to complains of headaches since she fell, head trauma to her right face, she also complains of stress at home she took care of her husband who suffered stroke, has gait difficulty  Her headache used to be around her right face, right frontal region, also had right upper lip teeth paresthesia, has been taking gabapentin which has helped her symptoms,  Since February 2018, she noticed increased headache almost daily bases, also left TMJ area pain, worsening after opening her mouth, she also complains of right upper lip numbness, deep achy pain, right cheek area stretchy sensation, Headache, he livew with her husband, stroke, watch him a lot, walk with a wlaker, sometimes her headache woke her up from sleep, she had been taking frequent tramadol 3-4 times a day, she also noted to have bilateral droopy eyelid, is planning to have blepharoplasty surgery soon.  She denies chewing difficulty, no significant weakness.  I personally reviewed MRI of the brain with and without contrast on September 15 2016,  mild generalized atrophy, supratentorium small vessel disease no acute abnormality  REVIEW OF SYSTEMS: Full 14 system review of systems performed and notable only for palpitation, hearing loss, ringing ears, blurry vision, eye pain, feeling cold, joint pain, achy muscles, headache, numbness, dizziness, anxiety  ALLERGIES: Allergies  Allergen Reactions  . Latex Hives    TONGUE HAD BLISTERS WHEN HAD DENTAL SURGERY  . Amoxicillin Nausea And Vomiting  . Chlorhexidine Gluconate Itching  . Aspirin     Gi upset  . Barbiturates   . Celebrex [Celecoxib]   . Codeine Itching  . Evista [Raloxifene]   . Lipitor [Atorvastatin]   . Morphine And Related   . Oruvail [Ketoprofen]   . Paraffin   . Prochlorperazine Other (See Comments)    Uncontrolled shaking  . Proloprim [Trimethoprim]   . Vibra-Tab [Doxycycline]   . Vioxx [Rofecoxib]     edema  . Betadine [Povidone Iodine] Rash  . Caffeine Palpitations    HOME MEDICATIONS: Current Outpatient Prescriptions  Medication Sig Dispense Refill  . atenolol (TENORMIN) 100 MG tablet TAKE 1 TABLET BY MOUTH EVERY MORNING 90 tablet 1  . cholecalciferol (VITAMIN D) 1000 UNITS tablet Take 5,000 Units by mouth daily.     . clonazePAM (KLONOPIN) 1 MG tablet TAKE 1/2-1 TABLET BY MOUTH UP TO 3 TIMESDAILY IF NEEDED FOR NERVES 90 tablet 1  . gabapentin (NEURONTIN) 300 MG capsule TAKE 1 CAPSULE BY MOUTH 3-4 TIMES DAILY FOR FACIAL PAIN 360 capsule 1  . hyoscyamine (LEVSIN SL) 0.125 MG SL tablet  Dissolve one tablet by mouth every 4-6 hours as needed 60 tablet 3  . losartan (COZAAR) 100 MG tablet TAKE 1/2 TABLET BY MOUTH DAILY 90 tablet 1  . pravastatin (PRAVACHOL) 40 MG tablet TAKE 1 TABLET BY MOUTH ONCE DAILY 90 tablet 2  . ranitidine (ZANTAC) 300 MG tablet TAKE 1 TABLET BY MOUTH TWICE DAILY AS NEEDED FOR ACID INDIGESTION ANDREFLUX 180 tablet 0  . traMADol (ULTRAM) 50 MG tablet TAKE 1 TABLET BY MOUTH 4 TIMES DAILY AS NEEDED FOR PAIN 120 tablet 1   No current  facility-administered medications for this visit.     PAST MEDICAL HISTORY: Past Medical History:  Diagnosis Date  . Anxiety   . Arthritis   . Blood transfusion   . Candida esophagitis ([place]) 2013   EGD  . Cataract   . Chronic kidney disease    STONES  . Diverticulosis of colon (without mention of hemorrhage) 2007   Colonoscopy  . Dysrhythmia    RX  . Emphysema of lung ([place])   . Family history of colon cancer    sister  . Fibromyalgia   . GERD (gastroesophageal reflux disease)   . Headache(784.0)   . Hyperlipemia   . Hypertension   . Kidney stones     PAST SURGICAL HISTORY: Past Surgical History:  Procedure Laterality Date  . ABDOMINAL HYSTERECTOMY    . BACK SURGERY     X2  . BACK SURGERY    . CERVICAL DISC SURGERY    . COLONOSCOPY    . FACIAL FRACTURE SURGERY    . HAND SURGERY     BIL   . ORIF TRIPOD FRACTURE  07/13/2011   Procedure: OPEN REDUCTION INTERNAL FIXATION (ORIF) TRIPOD FRACTURE;  Surgeon: Tyson Alias, MD;  Location: [facility];  Service: ENT;  Laterality: Right;  ORIF RIGHT ZYGOMA, ORBITAL FLOOR EXPLORATION WITH FROST STITCH (TEMPORARY TARSORRHAPHY)  . SHOULDER ARTHROSCOPY W/ ROTATOR CUFF REPAIR     LFT    FAMILY HISTORY: Family History  Problem Relation Age of Onset  . Heart disease Mother   . Heart disease Father   . Breast cancer Sister   . Colon cancer Sister   . Cancer Sister     breast  . Cancer Sister     colon  . Cancer Sister     colon  . Hypertension Daughter   . Hyperlipidemia Daughter     SOCIAL HISTORY:  Social History   Social History  . Marital status: Married    Spouse name: N/A  . Number of children: 1  . Years of education: 66   Occupational History  . Retired Retired   Social History Main Topics  . Smoking status: Never Smoker  . Smokeless tobacco: Never Used  . Alcohol use No  . Drug use: No  . Sexual activity: Not on file     Comment: HYSTER   Other Topics Concern  . Not on file   Social History  Narrative   [place].   Right-handed.   No caffeine use.     PHYSICAL EXAM   Vitals:   10/15/16 0910  BP: (!) 144/70  Pulse: (!) 54  Weight: 111 lb 8 oz (50.6 kg)  Height: _0  (1.626 m)    Not recorded      Body mass index is 19.14 kg/m.  PHYSICAL EXAMNIATION:  Gen: NAD, conversant, well nourised, obese, well groomed  Cardiovascular: Regular rate rhythm, no peripheral edema, warm, nontender. Eyes: Conjunctivae clear without exudates or hemorrhage Neck: Supple, no carotid bruits. Pulmonary: Clear to auscultation bilaterally   NEUROLOGICAL EXAM:  MENTAL STATUS: Speech:    Speech is normal; fluent and spontaneous with normal comprehension.  Cognition:     Orientation to time, place and person     Normal recent and remote memory     Normal Attention span and concentration     Normal Language, naming, repeating,spontaneous speech     Fund of knowledge   CRANIAL NERVES: CN II: Visual fields are full to confrontation. Fundoscopic exam is normal with sharp discs and no vascular changes. Pupils are round equal and briskly reactive to light. CN III, IV, VI: extraocular movement are normal. No ptosis. CN V: Facial sensation is intact to pinprick in all 3 divisions bilaterally. Corneal responses are intact.  CN VII: Face is symmetric with normal eye closure and smile. CN VIII: Hearing is normal to rubbing fingers CN IX, X: Palate elevates symmetrically. Phonation is normal. CN XI: Head turning and shoulder shrug are intact CN XII: Tongue is midline with normal movements and no atrophy.  MOTOR: There is no pronator drift of out-stretched arms. Muscle bulk and tone are normal. Muscle strength is normal.  REFLEXES: Reflexes are 2+ and symmetric at the biceps, triceps, knees, and ankles. Plantar responses are flexor.  SENSORY: Intact to light touch, pinprick, positional sensation and vibratory sensation are intact in  fingers and toes.  COORDINATION: Rapid alternating movements and fine finger movements are intact. There is no dysmetria on finger-to-nose and heel-knee-shin.    GAIT/STANCE: Posture is normal. Gait is steady with normal steps, base, arm swing, and turning.    DIAGNOSTIC DATA (LABS, IMAGING, TESTING) - I reviewed patient records, labs, notes, testing and imaging myself where available.   ASSESSMENT AND PLAN  Katilynn A Dileo is a 83 y.o. female   Chronic headaches since 2015, intermittent right facial paresthesia, New onset holoacranial headaches,  Laboratory evaluations including ESR C-reactive protein to rule out temporal arteritis  Effexor XR 37.5 mg every day as headache prevention  Continue gabapentin 300 mg 4 times a day  Tramadol as needed for pain   Marcial Pacas, M.D. Ph.D.  [facility] [address] [place], [place] [address] Ph: [phone] Fax: [phone]  CC: Jodi Marble, MD, Unk Pinto, MD

## 2016-10-16 ENCOUNTER — Other Ambulatory Visit: Payer: Self-pay

## 2016-10-16 DIAGNOSIS — G44329 Chronic post-traumatic headache, not intractable: Secondary | ICD-10-CM

## 2016-10-16 DIAGNOSIS — S02402S Zygomatic fracture, unspecified, sequela: Secondary | ICD-10-CM

## 2016-10-16 DIAGNOSIS — G501 Atypical facial pain: Secondary | ICD-10-CM

## 2016-10-16 LAB — VITAMIN B12: VITAMIN B 12: 527 pg/mL (ref 232–1245)

## 2016-10-16 LAB — SEDIMENTATION RATE: SED RATE: 4 mm/h (ref 0–40)

## 2016-10-16 LAB — C-REACTIVE PROTEIN: CRP: 3.7 mg/L (ref 0.0–4.9)

## 2016-10-16 MED ORDER — TRAMADOL HCL 50 MG PO TABS: ORAL_TABLET | ORAL | 1 refills | Status: DC

## 2016-10-17 ENCOUNTER — Other Ambulatory Visit: Payer: Self-pay | Admitting: Physician Assistant

## 2016-10-26 ENCOUNTER — Encounter: Payer: Self-pay | Admitting: Internal Medicine

## 2016-10-28 NOTE — Progress Notes (Signed)
[facility] Unk Pinto, M.D.      Uvaldo Bristle. Silverio Lay, P.A.-C [facility]                [address] [place] [address] Telephone [phone] Telefax [phone]  Comprehensive Evaluation &  Examination     This very nice 83 y.o. MWF presents for a  comprehensive evaluation and management of multiple medical co-morbidities.  Patient has been followed for HTN, Prediabetes, Hyperlipidemia and Vitamin D Deficiency. 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 Patient has had a 30# weight loss from dysgeusia and facial pain not controlled w/Gabapentin. Patient has hx/o GERD controlled with prudent diet and occas Ranitidine.       HTN predates since 49. Patient's BP has been controlled at home and patient denies any cardiac symptoms as chest pain, palpitations, shortness of breath, dizziness or ankle swelling. Today's BP is at goal - 128/74.      Patient's hyperlipidemia is controlled with diet and medications. Patient denies myalgias or other medication SE's. Last lipids were at goal: Lab Results  Component Value Date   CHOL 183 07/06/2016   HDL 95 07/06/2016   LDLCALC 58 07/06/2016   TRIG 148 07/06/2016   CHOLHDL 1.9 07/06/2016      Patient has prediabetes predating with A1c 6.8% in 2001 and dropping to 5.8% in 2013 and 5.3% in 2016 after a 30# weight loss.  Patient denies reactive hypoglycemic symptoms, visual blurring, diabetic polys, or paresthesias. Last A1c was at goal: Lab Results  Component Value Date   HGBA1C 5.4 04/05/2016      Finally, patient has history of Vitamin D Deficiency and last Vitamin D was at goal: Lab Results  Component Value Date   VD25OH 73 04/05/2016   Current Outpatient Prescriptions on File Prior to Visit  Medication Sig  . Atenolol 100 MG  TAKE 1 TABLET BY MOUTH EVERY MORNING  . VITAMIN D  1000 UNITS  Take 5,000 Units by mouth daily.   . clonazePAM  1 MG  TAKE 1/2-1 TABLET BY MOUTH UP TO 3 TIMESDAILY IF NEEDED FOR NERVES  . diclofenac  1 % GEL Apply 2 g topically 4 (four) times daily.  Marland Kitchen gabapentin  300 MG  TAKE 1 CAP 3-4 TIMES DAILY FOR FACIAL PAIN  . losartan  100 MG  TAKE 1/2 TABLET BY MOUTH DAILY  . pravastatin 40 MG  TAKE 1 TABLET BY MOUTH ONCE DAILY  . ranitidine  300 MG  TAKE 1 TABLET BY MOUTH TWICE DAILY AS NEEDED FOR ACID INDIGESTION ANDREFLUX  . traMADol  50 MG  TAKE 1 TAB 4 TIMES DAILY AS NEEDED FOR PAIN   Allergies  Allergen Reactions  . Latex Hives    TONGUE HAD BLISTERS WHEN HAD DENTAL SURGERY  . Amoxicillin Nausea And Vomiting  . Chlorhexidine Gluconate Itching  . Aspirin     Gi upset  . Barbiturates   . Celebrex [Celecoxib]   . Codeine Itching  . Evista [Raloxifene]   . Lipitor [Atorvastatin]   . Morphine And Related   . Oruvail [Ketoprofen]   . Paraffin   . Prochlorperazine Other (See Comments)    Uncontrolled shaking  . Proloprim [Trimethoprim]   . Vibra-Tab [Doxycycline]   . Vioxx [Rofecoxib]     edema  .  Betadine [Povidone Iodine] Rash  . Caffeine Palpitations   Past Medical History:  Diagnosis Date  . Anxiety   . Arthritis   . Blood transfusion   . Candida esophagitis ([place]) 2013   EGD  . Cataract   . Chronic kidney disease    STONES  . Diverticulosis of colon (without mention of hemorrhage) 2007   Colonoscopy  . Dysrhythmia    RX  . Emphysema of lung ([facility])   . Family history of colon cancer    sister  . Fibromyalgia   . GERD (gastroesophageal reflux disease)   . Headache(784.0)   . Hyperlipemia   . Hypertension   . Kidney stones    Health Maintenance  Topic Date Due  . TETANUS/TDAP  06/25/2013  . INFLUENZA VACCINE  01/23/2017  . COLONOSCOPY  03/17/2017  . DEXA SCAN  Completed  . PNA vac Low Risk Adult  Completed   Immunization History  Administered Date(s) Administered  . Influenza, High Dose Seasonal PF  03/25/2014, 04/05/2016  . Influenza-Unspecified 03/09/2013, 03/11/2015  . Pneumococcal Conjugate-13 12/21/2015  . Pneumococcal Polysaccharide-23 06/25/2002  . Td 06/26/2003   Past Surgical History:  Procedure Laterality Date  . ABDOMINAL HYSTERECTOMY    . BACK SURGERY     X2  . BACK SURGERY    . CERVICAL DISC SURGERY    . COLONOSCOPY    . FACIAL FRACTURE SURGERY    . HAND SURGERY     BIL   . ORIF TRIPOD FRACTURE  07/13/2011   Procedure: OPEN REDUCTION INTERNAL FIXATION (ORIF) TRIPOD FRACTURE;  Surgeon: Tyson Alias, MD;  Location: [facility];  Service: ENT;  Laterality: Right;  ORIF RIGHT ZYGOMA, ORBITAL FLOOR EXPLORATION WITH FROST STITCH (TEMPORARY TARSORRHAPHY)  . SHOULDER ARTHROSCOPY W/ ROTATOR CUFF REPAIR     LFT   Family History  Problem Relation Age of Onset  . Heart disease Mother   . Heart disease Father   . Breast cancer Sister   . Colon cancer Sister   . Cancer Sister     breast  . Cancer Sister     colon  . Cancer Sister     colon  . Hypertension Daughter   . Hyperlipidemia Daughter    Social History  Substance Use Topics  . Smoking status: Never Smoker  . Smokeless tobacco: Never Used  . Alcohol use No    ROS Constitutional: Denies fever, chills, weight loss/gain, headaches, insomnia,  night sweats, and change in appetite. Does c/o fatigue. Eyes: Denies redness, blurred vision, diplopia, discharge, itchy, watery eyes.  ENT: Denies discharge, congestion, post nasal drip, epistaxis, sore throat, earache, hearing loss, dental pain, Tinnitus, Vertigo, Sinus pain, snoring.  Cardio: Denies chest pain, palpitations, irregular heartbeat, syncope, dyspnea, diaphoresis, orthopnea, PND, claudication, edema Respiratory: denies cough, dyspnea, DOE, pleurisy, hoarseness, laryngitis, wheezing.  Gastrointestinal: Denies dysphagia, heartburn, reflux, water brash, pain, cramps, nausea, vomiting, bloating, diarrhea, constipation, hematemesis, melena, hematochezia, jaundice,  hemorrhoids Genitourinary: Denies dysuria, frequency, urgency, nocturia, hesitancy, discharge, hematuria, flank pain Breast: Breast lumps, nipple discharge, bleeding.  Musculoskeletal: Denies arthralgia, myalgia, stiffness, Jt. Swelling, pain, limp, and strain/sprain. Denies falls. Skin: Denies puritis, rash, hives, warts, acne, eczema, changing in skin lesion Neuro: No weakness, tremor, incoordination, spasms, paresthesia, pain Psychiatric: Denies confusion, memory loss, sensory loss. Denies Depression. Endocrine: Denies change in weight, skin, hair change, nocturia, and paresthesia, diabetic polys, visual blurring, hyper / hypo glycemic episodes.  Heme/Lymph: No excessive bleeding, bruising, enlarged lymph nodes.  Physical Exam  BP 128/74  Pulse (!) 52   Temp 97.2 F (36.2 C)   Resp 16   Ht 5' 3.5" (1.613 m)   Wt 111 lb 9.6 oz (50.6 kg)   BMI 19.46 kg/m   General Appearance: Well nourished, well groomed and in no apparent distress.  Eyes: PERRLA, EOMs, conjunctiva no swelling or erythema, normal fundi and vessels. Sinuses: No frontal/maxillary tenderness ENT/Mouth: EACs patent / TMs  nl. Nares clear without erythema, swelling, mucoid exudates. Oral hygiene is good. No erythema, swelling, or exudate. Tongue normal, non-obstructing. Tonsils not swollen or erythematous. Hearing normal.  Neck: Supple, thyroid normal. No bruits, nodes or JVD. Respiratory: Respiratory effort normal.  BS equal and clear bilateral without rales, rhonci, wheezing or stridor. Cardio: Heart sounds are normal with regular rate and rhythm and no murmurs, rubs or gallops. Peripheral pulses are normal and equal bilaterally without edema. No aortic or femoral bruits. Chest: symmetric with normal excursions and percussion. Breasts: Symmetric, without lumps, nipple discharge, retractions, or fibrocystic changes.  Abdomen: Flat, soft with bowel sounds active. Nontender, no guarding, rebound, hernias, masses, or  organomegaly.  Lymphatics: Non tender without lymphadenopathy.  Genitourinary:  Musculoskeletal: Full ROM all peripheral extremities, joint stability, 5/5 strength, and normal gait. Skin: Warm and dry without rashes, lesions, cyanosis, clubbing or  ecchymosis.  Neuro: Cranial nerves intact, reflexes equal bilaterally. Normal muscle tone, no cerebellar symptoms. Sensation intact.  Pysch: Alert and oriented X 3, normal affect, Insight and Judgment appropriate.   Assessment and Plan  1. Essential hypertension  - EKG 12-Lead - Urinalysis, Routine w reflex microscopic - Microalbumin / creatinine urine ratio - Hepatic function panel - Lipid panel - TSH  2. Hyperlipidemia, mixed  - EKG 12-Lead - TSH  3. Prediabetes  - EKG 12-Lead - Hemoglobin A1c - Insulin, random  4. Vitamin D deficiency  - VITAMIN D 25 Hydroxy   5. Abnormal glucose  - Hemoglobin A1c - Insulin, random  6. Gastroesophageal reflux disease   7. Screening for rectal cancer  - POC Hemoccult Bld/Stl  8. Screening for ischemic heart disease  - EKG 12-Lead - Lipid panel  9. Medication management  - Urinalysis, Routine w reflex microscopic - Microalbumin / creatinine urine ratio - CBC with Differential/Platelet - BASIC METABOLIC PANEL WITH GFR - Magnesium       Patient was counseled in prudent diet to achieve/maintain BMI less than 25 for weight control, BP monitoring, regular exercise and medications. Discussed med's effects and SE's. Screening labs and tests as requested with regular follow-up as recommended. Over 40 minutes of exam, counseling, chart review and high complex critical decision making was performed.

## 2016-10-29 ENCOUNTER — Encounter: Payer: Self-pay | Admitting: Internal Medicine

## 2016-10-29 ENCOUNTER — Other Ambulatory Visit: Payer: Self-pay | Admitting: *Deleted

## 2016-10-29 ENCOUNTER — Ambulatory Visit (INDEPENDENT_AMBULATORY_CARE_PROVIDER_SITE_OTHER): Payer: PPO | Admitting: Internal Medicine

## 2016-10-29 VITALS — BP 128/74 | HR 52 | Temp 97.2°F | Resp 16 | Ht 63.5 in | Wt 111.6 lb

## 2016-10-29 DIAGNOSIS — Z79899 Other long term (current) drug therapy: Secondary | ICD-10-CM | POA: Diagnosis not present

## 2016-10-29 DIAGNOSIS — Z136 Encounter for screening for cardiovascular disorders: Secondary | ICD-10-CM | POA: Diagnosis not present

## 2016-10-29 DIAGNOSIS — R7309 Other abnormal glucose: Secondary | ICD-10-CM

## 2016-10-29 DIAGNOSIS — Z1212 Encounter for screening for malignant neoplasm of rectum: Secondary | ICD-10-CM

## 2016-10-29 DIAGNOSIS — R7303 Prediabetes: Secondary | ICD-10-CM

## 2016-10-29 DIAGNOSIS — K219 Gastro-esophageal reflux disease without esophagitis: Secondary | ICD-10-CM

## 2016-10-29 DIAGNOSIS — E782 Mixed hyperlipidemia: Secondary | ICD-10-CM | POA: Diagnosis not present

## 2016-10-29 DIAGNOSIS — E559 Vitamin D deficiency, unspecified: Secondary | ICD-10-CM | POA: Diagnosis not present

## 2016-10-29 DIAGNOSIS — I10 Essential (primary) hypertension: Secondary | ICD-10-CM | POA: Diagnosis not present

## 2016-10-29 LAB — CBC WITH DIFFERENTIAL/PLATELET
BASOS ABS: 66 {cells}/uL (ref 0–200)
Basophils Relative: 1 %
EOS ABS: 66 {cells}/uL (ref 15–500)
Eosinophils Relative: 1 %
HEMATOCRIT: 37.7 % (ref 35.0–45.0)
HEMOGLOBIN: 12.4 g/dL (ref 11.7–15.5)
LYMPHS ABS: 1650 {cells}/uL (ref 850–3900)
Lymphocytes Relative: 25 %
MCH: 31.2 pg (ref 27.0–33.0)
MCHC: 32.9 g/dL (ref 32.0–36.0)
MCV: 95 fL (ref 80.0–100.0)
MPV: 10 fL (ref 7.5–12.5)
Monocytes Absolute: 660 cells/uL (ref 200–950)
Monocytes Relative: 10 %
NEUTROS ABS: 4158 {cells}/uL (ref 1500–7800)
NEUTROS PCT: 63 %
Platelets: 247 10*3/uL (ref 140–400)
RBC: 3.97 MIL/uL (ref 3.80–5.10)
RDW: 12.9 % (ref 11.0–15.0)
WBC: 6.6 10*3/uL (ref 3.8–10.8)

## 2016-10-29 LAB — TSH: TSH: 0.76 m[IU]/L

## 2016-10-29 MED ORDER — HYOSCYAMINE SULFATE 0.125 MG SL SUBL: SUBLINGUAL_TABLET | SUBLINGUAL | 3 refills | Status: DC

## 2016-10-29 NOTE — Patient Instructions (Signed)

## 2016-10-30 LAB — URINALYSIS, MICROSCOPIC ONLY
Bacteria, UA: NONE SEEN [HPF]
Casts: NONE SEEN [LPF]
Crystals: NONE SEEN [HPF]
RBC / HPF: NONE SEEN RBC/HPF (ref <=2)
SQUAMOUS EPITHELIAL / LPF: NONE SEEN [HPF] (ref <=5)
WBC UA: NONE SEEN WBC/HPF (ref <=5)
YEAST: NONE SEEN [HPF]

## 2016-10-30 LAB — URINALYSIS, ROUTINE W REFLEX MICROSCOPIC
Bilirubin Urine: NEGATIVE
GLUCOSE, UA: NEGATIVE
Hgb urine dipstick: NEGATIVE
KETONES UR: NEGATIVE
NITRITE: NEGATIVE
PH: 6.5 (ref 5.0–8.0)
PROTEIN: NEGATIVE
Specific Gravity, Urine: 1.007 (ref 1.001–1.035)

## 2016-10-30 LAB — BASIC METABOLIC PANEL WITH GFR
BUN: 9 mg/dL (ref 7–25)
CHLORIDE: 97 mmol/L — AB (ref 98–110)
CO2: 30 mmol/L (ref 20–31)
CREATININE: 0.75 mg/dL (ref 0.60–0.88)
Calcium: 9.4 mg/dL (ref 8.6–10.4)
GFR, Est African American: 87 mL/min (ref >=60)
GFR, Est Non African American: 76 mL/min (ref >=60)
GLUCOSE: 86 mg/dL (ref 65–99)
Potassium: 4.8 mmol/L (ref 3.5–5.3)
Sodium: 136 mmol/L (ref 135–146)

## 2016-10-30 LAB — MAGNESIUM: MAGNESIUM: 1.9 mg/dL (ref 1.5–2.5)

## 2016-10-30 LAB — MICROALBUMIN / CREATININE URINE RATIO
Creatinine, Urine: 36 mg/dL (ref 20–320)
Microalb Creat Ratio: 19 mcg/mg creat (ref <30)
Microalb, Ur: 0.7 mg/dL

## 2016-10-30 LAB — HEPATIC FUNCTION PANEL
ALBUMIN: 4.5 g/dL (ref 3.6–5.1)
ALK PHOS: 61 U/L (ref 33–130)
ALT: 11 U/L (ref 6–29)
AST: 19 U/L (ref 10–35)
BILIRUBIN DIRECT: 0.1 mg/dL (ref <=0.2)
Indirect Bilirubin: 0.5 mg/dL (ref 0.2–1.2)
Total Bilirubin: 0.6 mg/dL (ref 0.2–1.2)
Total Protein: 7.2 g/dL (ref 6.1–8.1)

## 2016-10-30 LAB — LIPID PANEL
CHOL/HDL RATIO: 2.2 ratio (ref <5.0)
Cholesterol: 179 mg/dL (ref <200)
HDL: 81 mg/dL (ref >50)
LDL CALC: 71 mg/dL (ref <100)
Triglycerides: 134 mg/dL (ref <150)
VLDL: 27 mg/dL (ref <30)

## 2016-10-30 LAB — VITAMIN D 25 HYDROXY (VIT D DEFICIENCY, FRACTURES): VIT D 25 HYDROXY: 76 ng/mL (ref 30–100)

## 2016-10-30 LAB — INSULIN, RANDOM: Insulin: 2.9 u[IU]/mL (ref 2.0–19.6)

## 2016-10-30 LAB — HEMOGLOBIN A1C
HEMOGLOBIN A1C: 5.2 % (ref <5.7)
Mean Plasma Glucose: 103 mg/dL

## 2016-10-31 ENCOUNTER — Ambulatory Visit (INDEPENDENT_AMBULATORY_CARE_PROVIDER_SITE_OTHER): Payer: PPO | Admitting: Orthopedic Surgery

## 2016-10-31 ENCOUNTER — Ambulatory Visit (INDEPENDENT_AMBULATORY_CARE_PROVIDER_SITE_OTHER): Payer: PPO | Admitting: Family

## 2016-10-31 ENCOUNTER — Encounter (INDEPENDENT_AMBULATORY_CARE_PROVIDER_SITE_OTHER): Payer: Self-pay | Admitting: Family

## 2016-10-31 VITALS — Ht 63.0 in | Wt 111.0 lb

## 2016-10-31 DIAGNOSIS — M7541 Impingement syndrome of right shoulder: Secondary | ICD-10-CM

## 2016-10-31 DIAGNOSIS — G8929 Other chronic pain: Secondary | ICD-10-CM

## 2016-10-31 DIAGNOSIS — M5441 Lumbago with sciatica, right side: Secondary | ICD-10-CM

## 2016-10-31 MED ORDER — PREDNISONE 10 MG (21) PO TBPK: ORAL_TABLET | Freq: Every day | ORAL | 0 refills | Status: DC

## 2016-10-31 NOTE — Progress Notes (Signed)
Office Visit Note   Patient: Debra White           Date of Birth: 07-06-1933           MRN: [id number] Visit Date: 10/31/2016              Requested by: Unk Pinto, [address] [employer] [place], [place] [address] PCP: Unk Pinto, MD   Assessment & Plan: Visit Diagnoses:  1. Impingement syndrome of right shoulder   2. Chronic right-sided low back pain with right-sided sciatica     Plan: Injection today. Will follow up in 4 more weeks. Will discuss arthroscopy of right shoulder at follow up if inadequate relief with injection. Have ordered Prednisone dose pack. Will try this. Have offered referral to Dr. Ernestina Patches for eval and Possible injection.  Follow-Up Instructions: Return in about 4 weeks (around 11/28/2016).   Orders:  No orders of the defined types were placed in this encounter.  Meds ordered this encounter  Medications  . predniSONE (STERAPRED UNI-PAK 21 TAB) 10 MG (21) TBPK tablet    Sig: Take by mouth daily.    Dispense:  21 tablet    Refill:  0      Procedures: No procedures performed   Clinical Data: No additional findings.   Subjective: Chief Complaint  Patient presents with  . Right Shoulder - Pain  . Right Hip - Pain    Patient is an 83 year old woman who presents today for two separate issues, chronic right shoulder pain and right sided hip, low back, thigh and knee pain.   She complains of persistent right shoulder pain. Did have an injection in December that lasted several months. Last had DepoMedrol 1 month ago, states this was minimally helpful. Is hopeful for repeat injection today. Complains of aching pain this is worse at night. It is constant. Radiates down her upper arm. Does not have loss of range of motion. She is able to reach above head this is painful however  She has had numerous injections for right shoulder impingement over the years. States would like to defer surgical intervention as she is the primary  caregiver for her husband.   Him second problem is right-sided low back pain. Complains of associated hip anterior thigh and knee pain. This is been ongoing for months to years is been well much worse over last 4 weeks. Discussed possibility of using prednisone. She is resistant first. States that she has an upset stomach she takes prednisone. Does relate that she's had set 3 back surgeries. Does state she has had one epidural steroid injection with Dr. Ernestina Patches would like to defer this option.    Review of Systems  Constitutional: Negative for chills and fever.  Musculoskeletal: Positive for arthralgias.     Objective: Vital Signs: Ht 5\' 3"  (1.6 m)   Wt 111 lb (50.3 kg)   BMI 19.66 kg/m   Physical Exam  Constitutional: She is oriented to person, place, and time. She appears well-developed and well-nourished.  Pulmonary/Chest: Effort normal.  Musculoskeletal:       Right knee: She exhibits no effusion.  Neurological: She is alert and oriented to person, place, and time.  Psychiatric: She has a normal mood and affect.  Nursing note reviewed.   Right Knee Exam   Tenderness  The patient is experiencing tenderness in the lateral joint line and medial joint line.  Range of Motion  The patient has normal right knee ROM.  Tests  Varus: negative Valgus: negative  Other  Erythema: absent Swelling: mild Other tests: no effusion present   Right Hip Exam  Right hip exam is normal.   Tenderness  The patient is experiencing no tenderness.     Range of Motion  The patient has normal right hip ROM.  Tests  FABER: negative   Back Exam   Tenderness  The patient is experiencing no tenderness.   Tests  Straight leg raise right: negative Straight leg raise left: negative  Other  Gait: normal    Right Shoulder Exam   Tenderness  The patient is experiencing tenderness in the biceps tendon.  Range of Motion  The patient has normal right shoulder ROM.  Muscle  Strength  The patient has normal right shoulder strength.  Tests  Hawkin's test: positive Impingement: positive  Other  Erythema: absent      Specialty Comments:  No specialty comments available.  Imaging: No results found.   PMFS History: Patient Active Problem List   Diagnosis Date Noted  . New onset headache 10/15/2016  . Impingement syndrome of right shoulder 09/25/2016  . Impingement syndrome of left shoulder 09/25/2016  . COPD (chronic obstructive pulmonary disease) with emphysema ([facility]) 12/21/2015  . 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 Osteoporosis 12/21/2015  . Mild malnutrition ([facility]) 05/11/2015  . Encounter for Medicare annual wellness exam 04/08/2015  . Prediabetes 01/27/2014  . Medication management 01/27/2014  . 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 Vitamin D deficiency 07/08/2013  . Hypertension   . Hyperlipidemia   . GERD   . Headache, post-traumatic, chronic 09/20/2011  . Fracture, zygoma closed ([facility]) 07/13/2011   Past Medical History:  Diagnosis Date  . Anxiety   . Arthritis   . Blood transfusion   . Candida esophagitis ([facility]) 2013   EGD  . Cataract   . Chronic kidney disease    STONES  . Diverticulosis of colon (without mention of hemorrhage) 2007   Colonoscopy  . Dysrhythmia    RX  . Emphysema of lung ([facility])   . Family history of colon cancer    sister  . Fibromyalgia   . GERD (gastroesophageal reflux disease)   . Headache(784.0)   . Hyperlipemia   . Hypertension   . Kidney stones     Family History  Problem Relation Age of Onset  . Heart disease Mother   . Heart disease Father   . Breast cancer Sister   . Colon cancer Sister   . Cancer Sister     breast  . Cancer Sister     colon  . Cancer Sister     colon  . Hypertension Daughter   . Hyperlipidemia Daughter     Past Surgical History:  Procedure Laterality Date  . ABDOMINAL HYSTERECTOMY    . BACK SURGERY     X2  . BACK SURGERY    . CERVICAL DISC SURGERY    .  COLONOSCOPY    . FACIAL FRACTURE SURGERY    . HAND SURGERY     BIL   . ORIF TRIPOD FRACTURE  07/13/2011   Procedure: OPEN REDUCTION INTERNAL FIXATION (ORIF) TRIPOD FRACTURE;  Surgeon: Tyson Alias, MD;  Location: [facility];  Service: ENT;  Laterality: Right;  ORIF RIGHT ZYGOMA, ORBITAL FLOOR EXPLORATION WITH FROST STITCH (TEMPORARY TARSORRHAPHY)  . SHOULDER ARTHROSCOPY W/ ROTATOR CUFF REPAIR     LFT   Social History   Occupational History  . Retired Retired   Social History Main Topics  .  Smoking status: Never Smoker  . Smokeless tobacco: Never Used  . Alcohol use No  . Drug use: No  . Sexual activity: Not on file     Comment: HYSTER

## 2016-11-10 ENCOUNTER — Emergency Department (HOSPITAL_COMMUNITY)
Admission: EM | Admit: 2016-11-10 | Discharge: 2016-11-11 | Disposition: A | Discharge status: Home / Self Care | Payer: PPO | Attending: Emergency Medicine | Admitting: Emergency Medicine

## 2016-11-10 ENCOUNTER — Emergency Department (HOSPITAL_COMMUNITY): Payer: PPO

## 2016-11-10 ENCOUNTER — Encounter (HOSPITAL_COMMUNITY): Payer: Self-pay | Admitting: Emergency Medicine

## 2016-11-10 ENCOUNTER — Other Ambulatory Visit: Payer: Self-pay

## 2016-11-10 DIAGNOSIS — N189 Chronic kidney disease, unspecified: Secondary | ICD-10-CM | POA: Insufficient documentation

## 2016-11-10 DIAGNOSIS — J449 Chronic obstructive pulmonary disease, unspecified: Secondary | ICD-10-CM | POA: Diagnosis not present

## 2016-11-10 DIAGNOSIS — R0602 Shortness of breath: Secondary | ICD-10-CM | POA: Diagnosis not present

## 2016-11-10 DIAGNOSIS — Z91040 Latex allergy status: Secondary | ICD-10-CM | POA: Insufficient documentation

## 2016-11-10 DIAGNOSIS — R079 Chest pain, unspecified: Secondary | ICD-10-CM | POA: Diagnosis not present

## 2016-11-10 DIAGNOSIS — I129 Hypertensive chronic kidney disease with stage 1 through stage 4 chronic kidney disease, or unspecified chronic kidney disease: Secondary | ICD-10-CM | POA: Diagnosis not present

## 2016-11-10 DIAGNOSIS — Z79899 Other long term (current) drug therapy: Secondary | ICD-10-CM | POA: Diagnosis not present

## 2016-11-10 DIAGNOSIS — R0789 Other chest pain: Secondary | ICD-10-CM | POA: Diagnosis not present

## 2016-11-10 LAB — BASIC METABOLIC PANEL
ANION GAP: 9 (ref 5–15)
BUN: 11 mg/dL (ref 6–20)
CO2: 28 mmol/L (ref 22–32)
Calcium: 8.5 mg/dL — ABNORMAL LOW (ref 8.9–10.3)
Chloride: 97 mmol/L — ABNORMAL LOW (ref 101–111)
Creatinine, Ser: 0.77 mg/dL (ref 0.44–1.00)
GFR calc Af Amer: >60 mL/min (ref >60)
GLUCOSE: 109 mg/dL — AB (ref 65–99)
Potassium: 4.3 mmol/L (ref 3.5–5.1)
Sodium: 134 mmol/L — ABNORMAL LOW (ref 135–145)

## 2016-11-10 LAB — HEPATIC FUNCTION PANEL
ALBUMIN: 3.7 g/dL (ref 3.5–5.0)
ALK PHOS: 58 U/L (ref 38–126)
ALT: 19 U/L (ref 14–54)
AST: 22 U/L (ref 15–41)
Bilirubin, Direct: 0.1 mg/dL (ref 0.1–0.5)
Indirect Bilirubin: 0.4 mg/dL (ref 0.3–0.9)
TOTAL PROTEIN: 6.6 g/dL (ref 6.5–8.1)
Total Bilirubin: 0.5 mg/dL (ref 0.3–1.2)

## 2016-11-10 LAB — CBC
HEMATOCRIT: 36.4 % (ref 36.0–46.0)
HEMOGLOBIN: 11.9 g/dL — AB (ref 12.0–15.0)
MCH: 31.4 pg (ref 26.0–34.0)
MCHC: 32.7 g/dL (ref 30.0–36.0)
MCV: 96 fL (ref 78.0–100.0)
Platelets: 275 10*3/uL (ref 150–400)
RBC: 3.79 MIL/uL — ABNORMAL LOW (ref 3.87–5.11)
RDW: 12.5 % (ref 11.5–15.5)
WBC: 9 10*3/uL (ref 4.0–10.5)

## 2016-11-10 LAB — I-STAT TROPONIN, ED
Troponin i, poc: 0 ng/mL (ref 0.00–0.08)
Troponin i, poc: 0 ng/mL (ref 0.00–0.08)

## 2016-11-10 LAB — LIPASE, BLOOD: Lipase: 36 U/L (ref 11–51)

## 2016-11-10 MED ORDER — PANTOPRAZOLE SODIUM 20 MG PO TBEC: 20.0000 mg | DELAYED_RELEASE_TABLET | Freq: Every day | ORAL | 0 refills | Status: DC

## 2016-11-10 MED ORDER — GI COCKTAIL ~~LOC~~
30.0000 mL | Freq: Once | ORAL | Status: AC
  Administered 2016-11-10: 30 mL via ORAL
  Filled 2016-11-10: qty 30

## 2016-11-10 NOTE — ED Provider Notes (Signed)
[facility] DEPT Provider Note   CSN: [id number] Arrival date & time: 11/10/16  4098     History   Chief Complaint Chief Complaint  Patient presents with  . Chest Pain    HPI Debra White is a 83 y.o. female.  HPI Patient presents with chest pain that started this morning around 3 AM. She had a recurrence of the pain this afternoon. She describes the pain as "indigestion-like". Pain is episodic and radiates to the back. Associated with mild nausea and some shortness of breath. States her symptoms have improved after one nitroglycerin. Denies similar pain in the past. No new lower extremity swelling or pain. No known coronary artery disease. Past Medical History:  Diagnosis Date  . Anxiety   . Arthritis   . Blood transfusion   . Candida esophagitis ([facility]) 2013   EGD  . Cataract   . Chronic kidney disease    STONES  . Diverticulosis of colon (without mention of hemorrhage) 2007   Colonoscopy  . Dysrhythmia    RX  . Emphysema of lung ([facility])   . Family history of colon cancer    sister  . Fibromyalgia   . GERD (gastroesophageal reflux disease)   . Headache(784.0)   . Hyperlipemia   . Hypertension   . Kidney stones     Patient Active Problem List   Diagnosis Date Noted  . New onset headache 10/15/2016  . Impingement syndrome of right shoulder 09/25/2016  . Impingement syndrome of left shoulder 09/25/2016  . COPD (chronic obstructive pulmonary disease) with emphysema ([facility]) 12/21/2015  . 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 Osteoporosis 12/21/2015  . Mild malnutrition ([facility]) 05/11/2015  . Encounter for Medicare annual wellness exam 04/08/2015  . Prediabetes 01/27/2014  . Medication management 01/27/2014  . 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 Vitamin D deficiency 07/08/2013  . Hypertension   . Hyperlipidemia   . GERD   . Headache, post-traumatic, chronic 09/20/2011  . Fracture, zygoma closed ([facility]) 07/13/2011    Past Surgical History:  Procedure Laterality  Date  . ABDOMINAL HYSTERECTOMY    . BACK SURGERY     X2  . BACK SURGERY    . CERVICAL DISC SURGERY    . COLONOSCOPY    . FACIAL FRACTURE SURGERY    . HAND SURGERY     BIL   . ORIF TRIPOD FRACTURE  07/13/2011   Procedure: OPEN REDUCTION INTERNAL FIXATION (ORIF) TRIPOD FRACTURE;  Surgeon: Tyson Alias, MD;  Location: [facility];  Service: ENT;  Laterality: Right;  ORIF RIGHT ZYGOMA, ORBITAL FLOOR EXPLORATION WITH FROST STITCH (TEMPORARY TARSORRHAPHY)  . SHOULDER ARTHROSCOPY W/ ROTATOR CUFF REPAIR     LFT    OB History    No data available       Home Medications    Prior to Admission medications   Medication Sig Start Date End Date Taking? Authorizing Provider  atenolol (TENORMIN) 100 MG tablet TAKE 1 TABLET BY MOUTH EVERY MORNING 03/14/16   Vicie Mutters, PA-C  cholecalciferol (VITAMIN D) 1000 UNITS tablet Take 5,000 Units by mouth daily.     [provider]  clonazePAM (KLONOPIN) 1 MG tablet TAKE 1/2-1 TABLET BY MOUTH UP TO 3 TIMESDAILY IF NEEDED FOR NERVES 07/04/16   Vicie Mutters, PA-C  gabapentin (NEURONTIN) 300 MG capsule TAKE 1 CAPSULE BY MOUTH 3-4 TIMES DAILY FOR FACIAL PAIN 07/05/16   Unk Pinto, MD  hyoscyamine (LEVSIN SL) 0.125 MG SL tablet Dissolve one tablet by mouth every 4-6 hours  as needed 10/29/16   Unk Pinto, MD  losartan (COZAAR) 100 MG tablet TAKE 1/2 TABLET BY MOUTH DAILY 07/09/16   Unk Pinto, MD  pantoprazole (PROTONIX) 20 MG tablet Take 1 tablet (20 mg total) by mouth daily. 11/10/16 12/10/16  Julianne Rice, MD  pravastatin (PRAVACHOL) 40 MG tablet TAKE 1 TABLET BY MOUTH ONCE DAILY 02/13/16   Unk Pinto, MD  predniSONE (STERAPRED UNI-PAK 21 TAB) 10 MG (21) TBPK tablet Take by mouth daily. 10/31/16   Suzan Slick, NP  ranitidine (ZANTAC) 300 MG tablet TAKE 1 TABLET BY MOUTH TWICE DAILY AS NEEDED FOR ACID INDIGESTION ANDREFLUX 10/10/16   Vicie Mutters, PA-C  traMADol (ULTRAM) 50 MG tablet TAKE 1 TABLET BY MOUTH 4 TIMES DAILY AS  NEEDED FOR PAIN 10/16/16   Vicie Mutters, PA-C  venlafaxine XR (EFFEXOR-XR) 37.5 MG 24 hr capsule  10/15/16   [provider]    Family History Family History  Problem Relation Age of Onset  . Heart disease Mother   . Heart disease Father   . Breast cancer Sister   . Colon cancer Sister   . Cancer Sister        breast  . Cancer Sister        colon  . Cancer Sister        colon  . Hypertension Daughter   . Hyperlipidemia Daughter     Social History Social History  Substance Use Topics  . Smoking status: Never Smoker  . Smokeless tobacco: Never Used  . Alcohol use No     Allergies   Latex; Amoxicillin; Chlorhexidine gluconate; Aspirin; Barbiturates; Celebrex [celecoxib]; Codeine; Evista [raloxifene]; Lipitor [atorvastatin]; Morphine and related; Oruvail [ketoprofen]; Paraffin; Prochlorperazine; Proloprim [trimethoprim]; Vibra-tab [doxycycline]; Vioxx [rofecoxib]; Betadine [povidone iodine]; and Caffeine   Review of Systems Review of Systems  Constitutional: Negative for chills and fever.  HENT: Negative for facial swelling, sore throat, trouble swallowing and voice change.   Respiratory: Positive for shortness of breath. Negative for cough.   Cardiovascular: Positive for chest pain. Negative for palpitations and leg swelling.  Gastrointestinal: Positive for nausea. Negative for abdominal pain and diarrhea.  Genitourinary: Negative for dysuria, flank pain and frequency.  Musculoskeletal: Positive for back pain. Negative for arthralgias, myalgias, neck pain and neck stiffness.  Skin: Negative for rash and wound.  Neurological: Negative for dizziness, weakness, light-headedness, numbness and headaches.  All other systems reviewed and are negative.    Physical Exam Updated Vital Signs BP 132/66   Pulse 70   Temp 98.3 F (36.8 C) (Oral)   Resp (!) 22   SpO2 99%   Physical Exam  Constitutional: She is oriented to person, place, and time. She appears  well-developed and well-nourished. No distress.  HENT:  Head: Normocephalic and atraumatic.  Mouth/Throat: Oropharynx is clear and moist.  Eyes: EOM are normal. Pupils are equal, round, and reactive to light.  Neck: Normal range of motion. Neck supple. No JVD present.  Cardiovascular: Normal rate and regular rhythm.  Exam reveals no gallop and no friction rub.   No murmur heard. Pulmonary/Chest: Effort normal and breath sounds normal. No respiratory distress. She has no wheezes. She has no rales. She exhibits no tenderness.  Abdominal: Soft. Bowel sounds are normal. There is no tenderness. There is no rebound and no guarding.  Musculoskeletal: Normal range of motion. She exhibits no edema or tenderness.  No lower extremity swelling, asymmetry or tenderness. No CVA tenderness. No midline thoracic or lumbar tenderness.  Neurological: She is  alert and oriented to person, place, and time.  Skin: Skin is warm and dry. No rash noted. No erythema.  Psychiatric: She has a normal mood and affect. Her behavior is normal.  Nursing note and vitals reviewed.    ED Treatments / Results  Labs (all labs ordered are listed, but only abnormal results are displayed) Labs Reviewed  CBC - Abnormal; Notable for the following:       Result Value   RBC 3.79 (*)    Hemoglobin 11.9 (*)    All other components within normal limits  BASIC METABOLIC PANEL - Abnormal; Notable for the following:    Sodium 134 (*)    Chloride 97 (*)    Glucose, Bld 109 (*)    Calcium 8.5 (*)    All other components within normal limits  HEPATIC FUNCTION PANEL  LIPASE, BLOOD  I-STAT TROPOININ, ED  I-STAT TROPOININ, ED    EKG  EKG Interpretation  Date/Time:  Saturday Nov 10 2016 19:23:26 EDT Ventricular Rate:  59 PR Interval:  140 QRS Duration: 78 QT Interval:  416 QTC Calculation: 411 R Axis:   11 Text Interpretation:  Sinus bradycardia with sinus arrhythmia Otherwise normal ECG Confirmed by Lita Mains  MD, Shalea Tomczak  ([id number]) on 11/10/2016 7:30:35 PM       Radiology Dg Chest 2 View  Result Date: 11/10/2016 CLINICAL DATA:  Chest pain, shortness of breath EXAM: CHEST  2 VIEW COMPARISON:  06/27/2015 FINDINGS: Lungs are clear.  No pleural effusion or pneumothorax. The heart is top-normal in size. Visualized osseous structures are within normal limits. IMPRESSION: No evidence of acute cardiopulmonary disease. Electronically Signed   By: Julian Hy M.D.   On: 11/10/2016 19:55    Procedures Procedures (including critical care time)  Medications Ordered in ED Medications  gi cocktail (Maalox,Lidocaine,Donnatal) (30 mLs Oral Given 11/10/16 2013)     Initial Impression / Assessment and Plan / ED Course  I have reviewed the triage vital signs and the nursing notes.  Pertinent labs & imaging results that were available during my care of the patient were reviewed by me and considered in my medical decision making (see chart for details).     Patient's symptoms are completely resolved after GI cocktail. Patient states she has been using aspirin cream regularly. She thinks this may have exacerbated her gastroesophageal reflux disease. Advised stopping all NSAIDs and will start on PPI. Patient will need to follow-up with a gastroenterologist. Given that she does have some risk factors for coronary artery disease I have advised that she follow-up with a cardiologist as well. Return precautions have been given.  Final Clinical Impressions(s) / ED Diagnoses   Final diagnoses:  Atypical chest pain    New Prescriptions New Prescriptions   PANTOPRAZOLE (PROTONIX) 20 MG TABLET    Take 1 tablet (20 mg total) by mouth daily.     Julianne Rice, MD 11/10/16 [phone]

## 2016-11-10 NOTE — ED Notes (Signed)
Patient transported to X-ray 

## 2016-11-10 NOTE — Discharge Instructions (Signed)
Take over-the-counter Maalox or Mylanta. Stop using any cream with aspirin. Follow closely with her primary physician, gastroenterology and cardiology. Return as needed to the emergency department.

## 2016-11-10 NOTE — ED Triage Notes (Signed)
Pt c/o chest pain that woke her up aroun 0300 that has been intermittent through out the day. Pt reports pain got sever this evening and called EMS. EMS reports CP that radiates to back and both shoulders. 1 nitro and 4mg  zofran ODT given.

## 2016-11-14 ENCOUNTER — Ambulatory Visit (INDEPENDENT_AMBULATORY_CARE_PROVIDER_SITE_OTHER): Payer: PPO | Admitting: Neurology

## 2016-11-14 ENCOUNTER — Encounter: Payer: Self-pay | Admitting: Neurology

## 2016-11-14 VITALS — BP 105/64 | HR 61 | Ht 63.0 in | Wt 110.0 lb

## 2016-11-14 DIAGNOSIS — G44329 Chronic post-traumatic headache, not intractable: Secondary | ICD-10-CM | POA: Diagnosis not present

## 2016-11-14 NOTE — Progress Notes (Signed)
  PATIENT: Debra White DOB: 01/13/1934  Chief Complaint  Patient presents with  . Headache    She is here with her sister, [name].  Feels like her headaches have improved. She has stopped venlafaxine due to intolerable nausea.  She has pending surgery on 11/22/16 for what sounds like a blepharoplasty.       HISTORICAL  Debra White is a 83 years old right-handed female seen in refer by  her ENT doctor Karol Wolicki for evaluation of headaches, her primary care physician is Dr.lliam McKeown, initial evaluation was on October 15 2016.  I reviewed and summarized the referring note, she had a history of chronic diverticulitis, kidney stone, lumbar decompression surgery, cervical decompression surgery mary years ago, in 2015, she fell, with multiple right facial fracture, need surgery,    She began to complains of headaches since she fell, head trauma to her right face, she also complains of stress at home she took care of her husband who suffered stroke, has gait difficulty  Her headache used to be around her right face, right frontal region, also had right upper lip teeth paresthesia, has been taking gabapentin which has helped her symptoms,  Since February 2018, she noticed increased headache almost daily bases, also left TMJ area pain, worsening after opening her mouth, she also complains of right upper lip numbness, deep achy pain, right cheek area stretchy sensation, Headache, he livew with her husband, stroke, watch him a lot, walk with a wlaker, sometimes her headache woke her up from sleep, she had been taking frequent tramadol 3-4 times a day, she also noted to have bilateral droopy eyelid, is planning to have blepharoplasty surgery soon.  She denies chewing difficulty, no significant weakness.  I personally reviewed MRI of the brain with and without contrast on September 15 2016, mild generalized atrophy, supratentorium small vessel disease no acute abnormality  UPDATE Nov 14 2016: She is now taking gabapentin 300mg 4 tabs each day, 2 tablets before she goes to bed, she can sleep better now, She has tried effexor could not tolerate it, complaining significant GI side effect, her headache overall has much improved, continue has intermittent right facial discomfort, triggered by stress, lack of sleep,  Laboratory evaluation showed normal ESR C-reactive protein in April 2018, vitamin D was normal 76,  REVIEW OF SYSTEMS: Full 14 system review of systems performed and notable only for as above ALLERGIES: Allergies  Allergen Reactions  . Latex Hives    TONGUE HAD BLISTERS WHEN HAD DENTAL SURGERY  . Amoxicillin Nausea And Vomiting  . Chlorhexidine Gluconate Itching  . Aspirin     Gi upset  . Barbiturates   . Celebrex [Celecoxib]   . Codeine Itching  . Evista [Raloxifene]   . Lipitor [Atorvastatin]   . Morphine And Related   . Oruvail [Ketoprofen]   . Paraffin   . Prochlorperazine Other (See Comments)    Uncontrolled shaking  . Proloprim [Trimethoprim]   . Venlafaxine Nausea Only  . Vibra-Tab [Doxycycline]   . Vioxx [Rofecoxib]     edema  . Betadine [Povidone Iodine] Rash  . Caffeine Palpitations    HOME MEDICATIONS: Current Outpatient Prescriptions  Medication Sig Dispense Refill  . atenolol (TENORMIN) 100 MG tablet TAKE 1 TABLET BY MOUTH EVERY MORNING 90 tablet 1  . cholecalciferol (VITAMIN D) 1000 UNITS tablet Take 5,000 Units by mouth daily.     . clonazePAM (KLONOPIN) 1 MG tablet TAKE 1/2-1 TABLET BY MOUTH UP TO 3   TIMESDAILY IF NEEDED FOR NERVES 90 tablet 1  . gabapentin (NEURONTIN) 300 MG capsule TAKE 1 CAPSULE BY MOUTH 3-4 TIMES DAILY FOR FACIAL PAIN 360 capsule 1  . hyoscyamine (LEVSIN SL) 0.125 MG SL tablet Dissolve one tablet by mouth every 4-6 hours as needed 60 tablet 3  . losartan (COZAAR) 100 MG tablet TAKE 1/2 TABLET BY MOUTH DAILY 90 tablet 1  . pantoprazole (PROTONIX) 20 MG tablet Take 1 tablet (20 mg total) by mouth daily. 30 tablet 0   . pravastatin (PRAVACHOL) 40 MG tablet TAKE 1 TABLET BY MOUTH ONCE DAILY 90 tablet 2  . ranitidine (ZANTAC) 300 MG tablet TAKE 1 TABLET BY MOUTH TWICE DAILY AS NEEDED FOR ACID INDIGESTION ANDREFLUX 180 tablet 0  . traMADol (ULTRAM) 50 MG tablet TAKE 1 TABLET BY MOUTH 4 TIMES DAILY AS NEEDED FOR PAIN 120 tablet 1   No current facility-administered medications for this visit.     PAST MEDICAL HISTORY: Past Medical History:  Diagnosis Date  . Anxiety   . Arthritis   . Blood transfusion   . Candida esophagitis (HCC) 2013   EGD  . Cataract   . Chronic kidney disease    STONES  . Diverticulosis of colon (without mention of hemorrhage) 2007   Colonoscopy  . Dysrhythmia    RX  . Emphysema of lung (HCC)   . Family history of colon cancer    sister  . Fibromyalgia   . GERD (gastroesophageal reflux disease)   . Headache(784.0)   . Hyperlipemia   . Hypertension   . Kidney stones     PAST SURGICAL HISTORY: Past Surgical History:  Procedure Laterality Date  . ABDOMINAL HYSTERECTOMY    . BACK SURGERY     X2  . BACK SURGERY    . CERVICAL DISC SURGERY    . COLONOSCOPY    . FACIAL FRACTURE SURGERY    . HAND SURGERY     BIL   . ORIF TRIPOD FRACTURE  07/13/2011   Procedure: OPEN REDUCTION INTERNAL FIXATION (ORIF) TRIPOD FRACTURE;  Surgeon: Karol T Wolicki, MD;  Location: [facility] OR;  Service: ENT;  Laterality: Right;  ORIF RIGHT ZYGOMA, ORBITAL FLOOR EXPLORATION WITH FROST STITCH (TEMPORARY TARSORRHAPHY)  . SHOULDER ARTHROSCOPY W/ ROTATOR CUFF REPAIR     LFT    FAMILY HISTORY: Family History  Problem Relation Age of Onset  . Heart disease Mother   . Heart disease Father   . Breast cancer Sister   . Colon cancer Sister   . Cancer Sister        breast  . Cancer Sister        colon  . Cancer Sister        colon  . Hypertension Daughter   . Hyperlipidemia Daughter     SOCIAL HISTORY:  Social History   Social History  . Marital status: Married    Spouse name: N/A  .  Number of children: 1  . Years of education: 12   Occupational History  . Retired Retired   Social History Main Topics  . Smoking status: Never Smoker  . Smokeless tobacco: Never Used  . Alcohol use No  . Drug use: No  . Sexual activity: Not on file     Comment: HYSTER   Other Topics Concern  . Not on file   Social History Narrative   [place].   Right-handed.   No caffeine use.     PHYSICAL EXAM     Vitals:   11/14/16 1030  BP: 105/64  Pulse: 61  Weight: 110 lb (49.9 kg)  Height: 5' 3" (1.6 m)    Not recorded      Body mass index is 19.49 kg/m.  PHYSICAL EXAMNIATION:  Gen: NAD, conversant, well nourised, obese, well groomed                     Cardiovascular: Regular rate rhythm, no peripheral edema, warm, nontender. Eyes: Conjunctivae clear without exudates or hemorrhage Neck: Supple, no carotid bruits. Pulmonary: Clear to auscultation bilaterally   NEUROLOGICAL EXAM:  MENTAL STATUS: Speech:    Speech is normal; fluent and spontaneous with normal comprehension.  Cognition:     Orientation to time, place and person     Normal recent and remote memory     Normal Attention span and concentration     Normal Language, naming, repeating,spontaneous speech     Fund of knowledge   CRANIAL NERVES: CN II: Visual fields are full to confrontation. Fundoscopic exam is normal with sharp discs and no vascular changes. Pupils are round equal and briskly reactive to light. CN III, IV, VI: extraocular movement are normal. No ptosis. CN V: Facial sensation is intact to pinprick in all 3 divisions bilaterally. Corneal responses are intact.  CN VII: Face is symmetric with normal eye closure and smile. CN VIII: Hearing is normal to rubbing fingers CN IX, X: Palate elevates symmetrically. Phonation is normal. CN XI: Head turning and shoulder shrug are intact CN XII: Tongue is midline with normal movements and no atrophy.  MOTOR: There is no  pronator drift of out-stretched arms. Muscle bulk and tone are normal. Muscle strength is normal.  REFLEXES: Reflexes are 2+ and symmetric at the biceps, triceps, knees, and ankles. Plantar responses are flexor.  SENSORY: Intact to light touch, pinprick, positional sensation and vibratory sensation are intact in fingers and toes.  COORDINATION: Rapid alternating movements and fine finger movements are intact. There is no dysmetria on finger-to-nose and heel-knee-shin.    GAIT/STANCE: Posture is normal. Gait is steady with normal steps, base, arm swing, and turning.    DIAGNOSTIC DATA (LABS, IMAGING, TESTING) - I reviewed patient records, labs, notes, testing and imaging myself where available.   ASSESSMENT AND PLAN  Piper Hassebrock [place] is a 83 y.o. female   Chronic headaches since 2015 following a fall, trauma to her right face, intermittent right facial paresthesia, New onset holoacranial headaches,  Laboratory evaluations showed normal ESR C-reactive protein in April 2018_0  Her headache has been helped by gabapentin, I have suggested her stay within limit of gabapentin 300 mg 6 tablets a day  Tylenol as needed   Marcial Pacas, M.D. Ph.D.  [facility] [address] [place], [place] [address] Ph: [phone] Fax: [phone]  CC: Jodi Marble, MD, Unk Pinto, MD

## 2016-11-15 ENCOUNTER — Other Ambulatory Visit: Payer: Self-pay

## 2016-11-15 DIAGNOSIS — Z1212 Encounter for screening for malignant neoplasm of rectum: Secondary | ICD-10-CM

## 2016-11-15 LAB — POC HEMOCCULT BLD/STL (HOME/3-CARD/SCREEN)
Card #2 Fecal Occult Blod, POC: NEGATIVE
FECAL OCCULT BLD: NEGATIVE
FECAL OCCULT BLD: NEGATIVE

## 2016-11-21 ENCOUNTER — Encounter (HOSPITAL_COMMUNITY): Payer: Self-pay | Admitting: *Deleted

## 2016-11-21 NOTE — Progress Notes (Signed)
Anesthesia Chart Review:  Pt is a same day work up.   Pt is an 83 year old female scheduled for B internal ptosis repair on 11/22/2016 with Georjean Mode, MD  - PCP is Unk Pinto, MD  PMH includes:  HTN, hyperlipidemia, emphysema, GERD. Never smoker. BMI 19.5. S/p ORIF tripod fracture 07/13/11  - ED visit 11/10/16.  Felt to be atypical chest pain, relieved by GI cocktail.  EKG without acute abnormalities. Troponin negative x2.  Pt instructed to f/u with cardiology given risk factors.  Has new pt appt with Dr. Sanda Klein on 01/21/17.  Has GI appt 01/02/17 with Dr. Kennedy Bucker for GERD/chest pain.  Has ED f/u visit scheduled with PCP 11/27/16.    Medications include: Atenolol, losartan, Protonix, Pravachol, Zantac  Labs will be obtained DOS.   CXR 11/10/16: No evidence of acute cardiopulmonary disease  EKG 11/10/16: Sinus bradycardia (59 bpm) with sinus arrhythmia  Reviewed case with Dr. Kalman Shan.  If labs acceptable DOS, I anticipate pt can proceed as scheduled.   Willeen Cass, FNP-BC [facility]/Anesthesiology Phone: [phone] 11/21/2016 4:19 PM

## 2016-11-21 NOTE — Progress Notes (Signed)
Pt denies any acute cardiopulmonary issues. Pt stated that she is scheduled to follow up with Dr. Sallyanne Kuster, Cardiology and a Gastroenterologist. Pt denies having a cardiac cath but stated that an echo was performed " a long while ago, when I had the stress test." Pt made aware to stop taking  Aspirin,vitamins, fish oil and herbal medications. Do not take any NSAIDs ie: Ibuprofen, Advil, Naproxen, BC and Goody Powder or any medication containing Aspirin. Pt verbalized understanding of all pre-op instructions. Anesthesia asked to review pt history ( see note).

## 2016-11-22 ENCOUNTER — Encounter (HOSPITAL_COMMUNITY): Payer: Self-pay

## 2016-11-22 ENCOUNTER — Ambulatory Visit (HOSPITAL_COMMUNITY): Payer: PPO | Admitting: Emergency Medicine

## 2016-11-22 ENCOUNTER — Ambulatory Visit (HOSPITAL_COMMUNITY)
Admission: RE | Admit: 2016-11-22 | Discharge: 2016-11-22 | Disposition: A | Discharge status: Home / Self Care | Payer: PPO | Source: Ambulatory Visit | Attending: Oculoplastics Ophthalmology | Admitting: Oculoplastics Ophthalmology

## 2016-11-22 ENCOUNTER — Encounter (HOSPITAL_COMMUNITY)
Admission: RE | Disposition: A | Discharge status: Home / Self Care | Payer: Self-pay | Source: Ambulatory Visit | Attending: Oculoplastics Ophthalmology

## 2016-11-22 DIAGNOSIS — I10 Essential (primary) hypertension: Secondary | ICD-10-CM | POA: Insufficient documentation

## 2016-11-22 DIAGNOSIS — R51 Headache: Secondary | ICD-10-CM | POA: Insufficient documentation

## 2016-11-22 DIAGNOSIS — M797 Fibromyalgia: Secondary | ICD-10-CM | POA: Diagnosis not present

## 2016-11-22 DIAGNOSIS — H02423 Myogenic ptosis of bilateral eyelids: Secondary | ICD-10-CM | POA: Diagnosis not present

## 2016-11-22 DIAGNOSIS — J449 Chronic obstructive pulmonary disease, unspecified: Secondary | ICD-10-CM | POA: Diagnosis not present

## 2016-11-22 DIAGNOSIS — E785 Hyperlipidemia, unspecified: Secondary | ICD-10-CM | POA: Diagnosis not present

## 2016-11-22 DIAGNOSIS — K219 Gastro-esophageal reflux disease without esophagitis: Secondary | ICD-10-CM | POA: Insufficient documentation

## 2016-11-22 HISTORY — PX: PTOSIS REPAIR: SHX6568

## 2016-11-22 HISTORY — DX: Unspecified ptosis of bilateral eyelids: H02.403

## 2016-11-22 LAB — CBC
HEMATOCRIT: 38.7 % (ref 36.0–46.0)
Hemoglobin: 12.3 g/dL (ref 12.0–15.0)
MCH: 30.8 pg (ref 26.0–34.0)
MCHC: 31.8 g/dL (ref 30.0–36.0)
MCV: 96.8 fL (ref 78.0–100.0)
Platelets: 202 10*3/uL (ref 150–400)
RBC: 4 MIL/uL (ref 3.87–5.11)
RDW: 12.1 % (ref 11.5–15.5)
WBC: 7.4 10*3/uL (ref 4.0–10.5)

## 2016-11-22 LAB — BASIC METABOLIC PANEL
ANION GAP: 8 (ref 5–15)
BUN: 5 mg/dL — ABNORMAL LOW (ref 6–20)
CO2: 29 mmol/L (ref 22–32)
Calcium: 9.1 mg/dL (ref 8.9–10.3)
Chloride: 98 mmol/L — ABNORMAL LOW (ref 101–111)
Creatinine, Ser: 0.77 mg/dL (ref 0.44–1.00)
GFR calc Af Amer: >60 mL/min (ref >60)
GFR calc non Af Amer: >60 mL/min (ref >60)
GLUCOSE: 95 mg/dL (ref 65–99)
POTASSIUM: 4.1 mmol/L (ref 3.5–5.1)
Sodium: 135 mmol/L (ref 135–145)

## 2016-11-22 LAB — PROTIME-INR
INR: 0.97
Prothrombin Time: 12.9 seconds (ref 11.4–15.2)

## 2016-11-22 SURGERY — REPAIR, BLEPHAROPTOSIS
Anesthesia: Monitor Anesthesia Care | Site: Eye | Laterality: Bilateral

## 2016-11-22 MED ORDER — EPHEDRINE 5 MG/ML INJ
INTRAVENOUS | Status: AC
  Filled 2016-11-22: qty 10

## 2016-11-22 MED ORDER — PROPOFOL 10 MG/ML IV BOLUS
INTRAVENOUS | Status: AC
  Filled 2016-11-22: qty 20

## 2016-11-22 MED ORDER — BUPIVACAINE HCL 0.5 % IJ SOLN
INTRAMUSCULAR | Status: DC | PRN
  Administered 2016-11-22: 15:00:00

## 2016-11-22 MED ORDER — LIDOCAINE HCL (CARDIAC) 20 MG/ML IV SOLN
INTRAVENOUS | Status: DC | PRN
  Administered 2016-11-22: 40 mg via INTRATRACHEAL

## 2016-11-22 MED ORDER — TOBRAMYCIN-DEXAMETHASONE 0.3-0.1 % OP OINT
TOPICAL_OINTMENT | OPHTHALMIC | Status: AC
  Filled 2016-11-22: qty 3.5

## 2016-11-22 MED ORDER — SUCCINYLCHOLINE CHLORIDE 200 MG/10ML IV SOSY
PREFILLED_SYRINGE | INTRAVENOUS | Status: AC
  Filled 2016-11-22: qty 10

## 2016-11-22 MED ORDER — TOBRAMYCIN-DEXAMETHASONE 0.3-0.1 % OP OINT
TOPICAL_OINTMENT | OPHTHALMIC | Status: DC | PRN
  Administered 2016-11-22: 1 via OPHTHALMIC

## 2016-11-22 MED ORDER — BSS IO SOLN
INTRAOCULAR | Status: AC
  Filled 2016-11-22: qty 15

## 2016-11-22 MED ORDER — TETRACAINE HCL 0.5 % OP SOLN
OPHTHALMIC | Status: AC
  Filled 2016-11-22: qty 2

## 2016-11-22 MED ORDER — SODIUM CHLORIDE 0.9 % IV SOLN
INTRAVENOUS | Status: DC
  Administered 2016-11-22: 12:00:00 via INTRAVENOUS

## 2016-11-22 MED ORDER — BUPIVACAINE HCL (PF) 0.5 % IJ SOLN
INTRAMUSCULAR | Status: AC
  Filled 2016-11-22: qty 30

## 2016-11-22 MED ORDER — MIDAZOLAM HCL 5 MG/5ML IJ SOLN
INTRAMUSCULAR | Status: DC | PRN
  Administered 2016-11-22: 1 mg via INTRAVENOUS

## 2016-11-22 MED ORDER — FENTANYL CITRATE (PF) 250 MCG/5ML IJ SOLN
INTRAMUSCULAR | Status: AC
  Filled 2016-11-22: qty 5

## 2016-11-22 MED ORDER — ONDANSETRON HCL 4 MG/2ML IJ SOLN
INTRAMUSCULAR | Status: AC
  Filled 2016-11-22: qty 2

## 2016-11-22 MED ORDER — PROPARACAINE HCL 0.5 % OP SOLN
1.0000 [drp] | OPHTHALMIC | Status: DC | PRN
  Filled 2016-11-22: qty 15

## 2016-11-22 MED ORDER — LIDOCAINE-EPINEPHRINE 1 %-1:100000 IJ SOLN
INTRAMUSCULAR | Status: AC
  Filled 2016-11-22: qty 1

## 2016-11-22 MED ORDER — PROPOFOL 10 MG/ML IV BOLUS
INTRAVENOUS | Status: DC | PRN
  Administered 2016-11-22 (×2): 20 mg via INTRAVENOUS

## 2016-11-22 MED ORDER — LIDOCAINE HCL 2 % IJ SOLN
INTRAMUSCULAR | Status: AC
  Filled 2016-11-22: qty 20

## 2016-11-22 MED ORDER — SUGAMMADEX SODIUM 200 MG/2ML IV SOLN
INTRAVENOUS | Status: AC
  Filled 2016-11-22: qty 2

## 2016-11-22 MED ORDER — FENTANYL CITRATE (PF) 100 MCG/2ML IJ SOLN
INTRAMUSCULAR | Status: DC | PRN
  Administered 2016-11-22: 50 ug via INTRAVENOUS

## 2016-11-22 MED ORDER — LIDOCAINE 2% (20 MG/ML) 5 ML SYRINGE
INTRAMUSCULAR | Status: DC | PRN
  Administered 2016-11-22: 1.5 mL via INTRAVENOUS

## 2016-11-22 MED ORDER — LIDOCAINE 2% (20 MG/ML) 5 ML SYRINGE
INTRAMUSCULAR | Status: AC
  Filled 2016-11-22: qty 5

## 2016-11-22 MED ORDER — MIDAZOLAM HCL 2 MG/2ML IJ SOLN
INTRAMUSCULAR | Status: AC
  Filled 2016-11-22: qty 2

## 2016-11-22 SURGICAL SUPPLY — 28 items
APL SRG 3 HI ABS STRL LF PLS (MISCELLANEOUS) ×1
APPLICATOR COTTON TIP 6IN STRL (MISCELLANEOUS) ×2 IMPLANT
APPLICATOR DR MATTHEWS STRL (MISCELLANEOUS) ×2 IMPLANT
CLSR STERI-STRIP ANTIMIC 1/2X4 (GAUZE/BANDAGES/DRESSINGS) ×2 IMPLANT
CORDS BIPOLAR (ELECTRODE) IMPLANT
COVER SURGICAL LIGHT HANDLE (MISCELLANEOUS) ×2 IMPLANT
DRAPE ORTHO SPLIT 87X125 STRL (DRAPES) ×2 IMPLANT
DRAPE SURG 17X23 STRL (DRAPES) IMPLANT
FORCEPS BIPOLAR SPETZLER 8 1.0 (NEUROSURGERY SUPPLIES) IMPLANT
GLOVE BIO SURGEON STRL SZ7.5 (GLOVE) ×2 IMPLANT
GLOVE BIOGEL PI IND STRL 6.5 (GLOVE) IMPLANT
GLOVE BIOGEL PI INDICATOR 6.5 (GLOVE) ×1
GLOVE SURG SS PI 6.5 STRL IVOR (GLOVE) ×2 IMPLANT
GLOVE SURG SS PI 7.5 STRL IVOR (GLOVE) ×1 IMPLANT
GOWN STRL REUS W/ TWL LRG LVL3 (GOWN DISPOSABLE) ×2 IMPLANT
GOWN STRL REUS W/TWL LRG LVL3 (GOWN DISPOSABLE) ×4
NDL PRECISIONGLIDE 27X1.5 (NEEDLE) IMPLANT
NEEDLE PRECISIONGLIDE 27X1.5 (NEEDLE) IMPLANT
NS IRRIG 1000ML POUR BTL (IV SOLUTION) ×2 IMPLANT
PACK CATARACT CUSTOM (CUSTOM PROCEDURE TRAY) ×2 IMPLANT
PAD ARMBOARD 7.5X6 YLW CONV (MISCELLANEOUS) ×4 IMPLANT
STRIP CLOSURE SKIN 1/2X4 (GAUZE/BANDAGES/DRESSINGS) ×2 IMPLANT
SUT ETHILON 7 0 P 1 (SUTURE) IMPLANT
SUT PLAIN 5 0 P 3 18 (SUTURE) ×2 IMPLANT
SUT SILK 4 0 P 3 (SUTURE) ×2 IMPLANT
SUT SILK 6 0 BV 1XDISCX (SUTURE) IMPLANT
TOWEL OR 17X24 6PK STRL BLUE (TOWEL DISPOSABLE) ×4 IMPLANT
WATER STERILE IRR 1000ML POUR (IV SOLUTION) ×2 IMPLANT

## 2016-11-22 NOTE — Anesthesia Preprocedure Evaluation (Signed)
Anesthesia Evaluation  Patient identified by MRN, date of birth, ID band Patient awake    Reviewed: Allergy & Precautions, H&P , NPO status , Patient's Chart, lab work & pertinent test results  History of Anesthesia Complications Negative for: history of anesthetic complications  Airway Mallampati: II  TM Distance: >3 FB Neck ROM: Full    Dental no notable dental hx.    Pulmonary    Pulmonary exam normal breath sounds clear to auscultation       Cardiovascular hypertension, Pt. on medications Normal cardiovascular exam+ dysrhythmias  Rhythm:Regular Rate:Normal - Systolic murmurs    Neuro/Psych  Headaches,  Neuromuscular disease    GI/Hepatic Neg liver ROS, GERD  Controlled,  Endo/Other  negative endocrine ROS  Renal/GU negative Renal ROS     Musculoskeletal  (+) Fibromyalgia -  Abdominal   Peds  Hematology   Anesthesia Other Findings   Reproductive/Obstetrics                             Anesthesia Physical  Anesthesia Plan  ASA: III  Anesthesia Plan: MAC   Post-op Pain Management:    Induction: Intravenous  Airway Management Planned:   Additional Equipment:   Intra-op Plan:   Post-operative Plan:   Informed Consent: I have reviewed the patients History and Physical, chart, labs and discussed the procedure including the risks, benefits and alternatives for the proposed anesthesia with the patient or authorized representative who has indicated his/her understanding and acceptance.     Plan Discussed with: CRNA, Anesthesiologist and Surgeon  Anesthesia Plan Comments:         Anesthesia Quick Evaluation

## 2016-11-22 NOTE — Anesthesia Procedure Notes (Signed)
Procedure Name: MAC Date/Time: 11/22/2016 2:04 PM Performed by: Salli Quarry Ganesh Deeg Pre-anesthesia Checklist: Patient identified Patient Re-evaluated:Patient Re-evaluated prior to inductionOxygen Delivery Method: Nasal cannula

## 2016-11-22 NOTE — Anesthesia Postprocedure Evaluation (Signed)
Anesthesia Post Note  Patient: Debra White  Procedure(s) Performed: Procedure(s) (LRB): INTERNAL PTOSIS REPAIR (Bilateral)     Patient location during evaluation: PACU Anesthesia Type: MAC Level of consciousness: awake and alert Pain management: pain level controlled Vital Signs Assessment: post-procedure vital signs reviewed and stable Respiratory status: spontaneous breathing, nonlabored ventilation and respiratory function stable Cardiovascular status: stable and blood pressure returned to baseline Anesthetic complications: no    Last Vitals:  Vitals:   11/22/16 1500 11/22/16 1515  BP: (!) 120/57 99/70  Pulse: (!) 56 (!) 56  Resp: 13 14  Temp:      Last Pain:  Vitals:   11/22/16 1447  TempSrc:   PainSc: 0-No pain                 Lynda Rainwater

## 2016-11-22 NOTE — Brief Op Note (Signed)
11/22/2016  1:58 PM  PATIENT:  Debra White  83 y.o. female  PRE-OPERATIVE DIAGNOSIS:  BILATERAL MYOGENIC PTOSIS OF EYELID   POST-OPERATIVE DIAGNOSIS:  * No post-op diagnosis entered *  PROCEDURE:  Procedure(s): INTERNAL PTOSIS REPAIR (Bilateral)  SURGEON:  Surgeon(s) and Role:    * Abugo, Peyton Najjar, MD - Primary  PHYSICIAN ASSISTANT:   ASSISTANTS: none   ANESTHESIA:   local and MAC  EBL:  No intake/output data recorded.  BLOOD ADMINISTERED:none  DRAINS: none   LOCAL MEDICATIONS USED:  BUPIVICAINE , LIDOCAINE  and OTHER epinephrine  SPECIMEN:  No Specimen  DISPOSITION OF SPECIMEN:  N/A  COUNTS:  YES  TOURNIQUET:  * No tourniquets in log *  DICTATION: .Note written in EPIC  PLAN OF CARE: Discharge to home after PACU  PATIENT DISPOSITION:  PACU - hemodynamically stable.   Delay start of Pharmacological VTE agent (>24hrs) due to surgical blood loss or risk of bleeding: NOT APPLICABLE

## 2016-11-22 NOTE — Transfer of Care (Signed)
Immediate Anesthesia Transfer of Care Note  Patient: Debra White  Procedure(s) Performed: Procedure(s): INTERNAL PTOSIS REPAIR (Bilateral)  Patient Location: PACU  Anesthesia Type:MAC  Level of Consciousness: awake, alert , oriented and patient cooperative  Airway & Oxygen Therapy: Patient Spontanous Breathing  Post-op Assessment: Report given to RN and Post -op Vital signs reviewed and stable  Post vital signs: Reviewed and stable  Last Vitals:  Vitals:   11/22/16 1127 11/22/16 1447  BP: (!) 163/61 (!) 121/56  Pulse: (!) 58 (!) 56  Resp: 18 11  Temp: 36.7 C 36.4 C    Last Pain:  Vitals:   11/22/16 1203  TempSrc:   PainSc: 3          Complications: No apparent anesthesia complications

## 2016-11-22 NOTE — Op Note (Signed)
Procedure(s): INTERNAL PTOSIS REPAIR Procedure Note  Debra White female 83 y.o. 11/22/2016  Procedure(s) and Anesthesia Type:    * INTERNAL PTOSIS REPAIR - Monitor Anesthesia Care  Surgeon(s) and Role:    * Abugo, Peyton Najjar, MD - Primary        Surgeon: Clista Bernhardt   Assistants: none  ASA Class: 1    Procedure Detail  INTERNAL PTOSIS REPAIR  PREOP DIAGNOSIS:    Bilateral Upper eyelid myogenic ptosis POSTOP DIAGNOSIS:    Same  PROCEDURE:    Internal Ptosis Repair, Bilateral Upper Eyelid 23762  SURGEON:    Georjean Mode MD  ANESTHESIA:  Monitored Anesthesia Care with local standard oculoplastic block     Lidocaine 2% with Epinephrine 1:100,000/Marcaine 0.75%     Total volume used: 2 mL  COMPLICATIONS:  None BLOOD LOSS:  Minimal SPECIMEN:  None  INDICATIONS:  This patient presents with bilateral upper eyelid position that is too low (MRD1 < 2) and good levator function. The position of the eyelid is causing visual dysfunction that interferes with tasks of daily living including reading and computer use due to the superiorvisual field defect.  Informed consent had been provided prior to the day of surgery at the clinic visit. A handout was provided and the patient was asked to sign an eyelid informed consent specific to this procedure. All questions were answered.  The patient understands that the goal of surgery is to improve visual function. This is not a cosmetic procedure, no skin or fat is being removed. Prior to entering the operative suite, the general surgical consent was signed. All questions were answered.  The patient understands the risks of surgery include but are not limited to bleeding, infection, scar formation, asymmetry, the need for additional surgical intervention and other less common outcomes noted on the specific eyelid surgery consent and anesthesia risks listed on the anesthesia consent. The patient accepts the risks and desires to  proceed with surgery as the position of the upper eyelid blocks the superior visual field interrupting tasks of daily living to include reading, driving and computer use.   PROCEDURE :  The patient was taken to the operating room and placed in the supine position with the usual monitoring in place. The patient was given a drop of Proparacaine 0.5%, then prepped and draped in the usual standard fashion. The lateral eyelid was injected with standard oculoplastic block in the region of entry and exit for the running suture. The eyelid was then everted over a desmarres retractor and injected with the standard oculoplastic block.    In clinic, the patient had complete correction with one drop of phenylephrine. The goal was to perform a 7 mm tuck in the conjunctiva, so the conjunctiva was marked at 3.5 mm from the tarsal border in a medial, central and lateral position with three interrupted 6-0 silk sutures which were then secured with a steristrip. The sutures were then pulled inferiorly and the putterman ptosis clamp was placed in standard fashion.    Using the Putterman ptosis clamp to provide traction, a 5-0 gut suture was passed through the upper lid laterally approximately 2 mm from the eyelashes and passed in a running fashion from lateral aspect of the Putterman ptosis clamp medially and back again laterally and then brought out through the skin adjacent to the entry location and tied externally to avoid corneal irritation.  The silk suture and approximately 1 mm of conjunctiva and mueller's muscle was excised in such a  fashion to create a smooth internal wound edge. The external knot was then covered with a 1 cm steristrip.  Attention was turned to the other eyelid, where the same procedure was performed.    Erythromycin 0.3% ointment were administered to bilateral eyes, The patient was taken to the PACU in good condition.  Findings: As above  Estimated Blood Loss:  Minimal         Drains:  none         Total IV Fluids: <1Lml  Blood Given: none          Specimens: none         Implants: none        Complications:  * No complications entered in OR log *         Disposition: PACU - hemodynamically stable.         Condition: stable

## 2016-11-22 NOTE — H&P (Addendum)
Subjective:     Debra White is a 83 y.o. female who presents for evaluation of bilateral myogenic ptosis. The pain is described as none and is 0/10 in intensity.   Onset was a few years ago. Symptoms have been unchanged since. Aggravating factors: unable to associate with any factor.  Alleviating factors: nothing. Associated symptoms: The patient denies other associated symptoms.. The patient denies anything else.  Patient History:  The following portions of the patient's history were reviewed and updated as appropriate: allergies, current medications, past family history, past medical history, past social history, past surgical history and problem list.  Review of Systems Pertinent items are noted in HPI.    Objective:    There were no vitals taken for this visit.  General:  alert, cooperative and appears stated age  Skin:  normal  Eyes: positive findings: eyelids/periorbital: ptosis bilaterally  Mouth: {defer  Lymph Nodes:  Cervical, supraclavicular, and axillary nodes normal.  Lungs:  clear to auscultation bilaterally  Heart:  regular rate and rhythm, S1, S2 normal, no murmur, click, rub or gallop  Abdomen: soft, non-tender; bowel sounds normal; no masses,  no organomegaly  CVA:  absent  Genitourinary: {deferred  Extremities:  extremities normal, atraumatic, no cyanosis or edema  Neurologic:  negative  Psychiatric:  normal mood, behavior, speech, dress, and thought processes     Assessment:   Bilateral Myogenic Ptosis  Plan:    1. Discussed the risk of surgery including bleeding infection asymmetry, scarring, need for reoperation,  and the risks of general anesthetic including MI, CVA, sudden death or even reaction to anesthetic medications. The patient understands the risks, any and all questions were answered to the patient's satisfaction. 2. Bilateral Internal Ptosis Repair 3. Follow up: 1 week.  Date of Surgery Update (To be completed by Attending Surgeon day of  surgery.) 11/22/2016. No changes to exam

## 2016-11-23 ENCOUNTER — Encounter (HOSPITAL_COMMUNITY): Payer: Self-pay | Admitting: Oculoplastics Ophthalmology

## 2016-11-23 ENCOUNTER — Ambulatory Visit: Payer: Self-pay | Admitting: Internal Medicine

## 2016-11-27 ENCOUNTER — Encounter: Payer: Self-pay | Admitting: Internal Medicine

## 2016-11-27 ENCOUNTER — Ambulatory Visit (INDEPENDENT_AMBULATORY_CARE_PROVIDER_SITE_OTHER): Payer: PPO | Admitting: Internal Medicine

## 2016-11-27 VITALS — BP 116/74 | HR 60 | Temp 97.3°F | Resp 16 | Ht 63.0 in | Wt 109.6 lb

## 2016-11-27 DIAGNOSIS — K219 Gastro-esophageal reflux disease without esophagitis: Secondary | ICD-10-CM

## 2016-11-27 NOTE — Patient Instructions (Signed)
Food Choices for Gastroesophageal Reflux Disease, Adult When you have gastroesophageal reflux disease (GERD), the foods you eat and your eating habits are very important. Choosing the right foods can help ease the discomfort of GERD. Consider working with a diet and nutrition specialist (dietitian) to help you make healthy food choices. What general guidelines should I follow? Eating plan  Choose healthy foods low in fat, such as fruits, vegetables, whole grains, low-fat dairy products, and lean meat, fish, and poultry.  Eat frequent, small meals instead of three large meals each day. Eat your meals slowly, in a relaxed setting. Avoid bending over or lying down until 2-3 hours after eating.  Limit high-fat foods such as fatty meats or fried foods.  Limit your intake of oils, butter, and shortening to less than 8 teaspoons each day.  Avoid the following: ? Foods that cause symptoms. These may be different for different people. Keep a food diary to keep track of foods that cause symptoms. ? Alcohol. ? Drinking large amounts of liquid with meals. ? Eating meals during the 2-3 hours before bed.  Cook foods using methods other than frying. This may include baking, grilling, or broiling. Lifestyle   Maintain a healthy weight. Ask your health care provider what weight is healthy for you. If you need to lose weight, work with your health care provider to do so safely.  Exercise for at least 30 minutes on 5 or more days each week, or as told by your health care provider.  Avoid wearing clothes that fit tightly around your waist and chest.  Do not use any products that contain nicotine or tobacco, such as cigarettes and e-cigarettes. If you need help quitting, ask your health care provider.  Sleep with the head of your bed raised. Use a wedge under the mattress or blocks under the bed frame to raise the head of the bed. What foods are not recommended? The items listed may not be a complete  list. Talk with your dietitian about what dietary choices are best for you. Grains Pastries or quick breads with added fat. [place] toast. Vegetables Deep fried vegetables. [place] fries. Any vegetables prepared with added fat. Any vegetables that cause symptoms. For some people this may include tomatoes and tomato products, chili peppers, onions and garlic, and horseradish. Fruits - Avoid Bananas Any fruits prepared with added fat. Any fruits that cause symptoms. For some people this may include citrus fruits, such as oranges, grapefruit, pineapple, and lemons. Meats and other protein foods High-fat meats, such as fatty beef or pork, hot dogs, ribs, ham, sausage, salami and bacon. Fried meat or protein, including fried fish and fried chicken. Nuts and nut butters. Dairy Whole milk and chocolate milk. Sour cream. Cream. Ice cream. Cream cheese. Milk shakes. Beverages Coffee and tea, with or without caffeine. Carbonated beverages. Sodas. Energy drinks. Fruit juice made with acidic fruits (such as orange or grapefruit). Tomato juice. Alcoholic drinks. Fats and oils Butter. Margarine. Shortening.  Sweets and desserts Chocolate and cocoa. Donuts. Seasoning and other foods Pepper. Peppermint and spearmint. Any condiments, herbs, or seasonings that cause symptoms. For some people, this may include curry, hot sauce, or vinegar-based salad dressings. Summary  When you have gastroesophageal reflux disease (GERD), food and lifestyle choices are very important to help ease the discomfort of GERD.  Eat frequent, small meals instead of three large meals each day. Eat your meals slowly, in a relaxed setting. Avoid bending over or lying down until 2-3 hours after eating.  Limit high-fat foods such as fatty meat or fried foods. ++++++++++++++++++++++++++++++++++  Heartburn Heartburn is a type of pain or discomfort that can happen in the throat or chest. It is often described as a burning pain. It may also  cause a bad taste in the mouth. Heartburn may feel worse when you lie down or bend over, and it is often worse at night. Heartburn may be caused by stomach contents that move back up into the esophagus (reflux). Follow these instructions at home: Take these actions to decrease your discomfort and to help avoid complications. Diet  Follow a diet as recommended by your health care provider. This may involve avoiding foods and drinks such as: ? Coffee and tea (with or without caffeine). ? Drinks that contain alcohol. ? Energy drinks and sports drinks. ? Carbonated drinks or sodas. ? Chocolate and cocoa. ? Peppermint and mint flavorings. ? Garlic and onions. ? Horseradish. ? Spicy and acidic foods, including peppers, chili powder, curry powder, vinegar, hot sauces, and barbecue sauce. ? Citrus fruit juices and citrus fruits, such as oranges, lemons, and limes. ? Tomato-based foods, such as red sauce, chili, salsa, and pizza with red sauce. ? Fried and fatty foods, such as donuts, french fries, potato chips, and high-fat dressings. ? High-fat meats, such as hot dogs and fatty cuts of red and white meats, such as rib eye steak, sausage, ham, and bacon. ? High-fat dairy items, such as whole milk, butter, and cream cheese.  Eat small, frequent meals instead of large meals.  Avoid drinking large amounts of liquid with your meals.  Avoid eating meals during the 2-3 hours before bedtime.  Avoid lying down right after you eat.  Do not exercise right after you eat. General instructions  Pay attention to any changes in your symptoms.  Take over-the-counter and prescription medicines only as told by your health care provider. Do not take aspirin, ibuprofen, or other NSAIDs unless your health care provider told you to do so.  Do not use any tobacco products, including cigarettes, chewing tobacco, and e-cigarettes. If you need help quitting, ask your health care provider.  Wear loose-fitting  clothing. Do not wear anything tight around your waist that causes pressure on your abdomen.  Raise (elevate) the head of your bed about 6 inches (15 cm).  Try to reduce your stress, such as with yoga or meditation. If you need help reducing stress, ask your health care provider.  If you are overweight, reduce your weight to an amount that is healthy for you. Ask your health care provider for guidance about a safe weight loss goal.  Keep all follow-up visits as told by your health care provider. This is important. Contact a health care provider if:  You have new symptoms.  You have unexplained weight loss.  You have difficulty swallowing, or it hurts to swallow.  You have wheezing or a persistent cough.  Your symptoms do not improve with treatment.  You have frequent heartburn for more than two weeks. Get help right away if:  You have pain in your arms, neck, jaw, teeth, or back.  You feel sweaty, dizzy, or light-headed.  You have chest pain or shortness of breath.  You vomit and your vomit looks like blood or coffee grounds.  Your stool is bloody or black. This information is not intended to replace advice given to you by your health care provider. Make sure you discuss any questions you have with your health care provider. Document Released: 10/28/2008  Document Revised: 11/17/2015 Document Reviewed: 10/06/2014 Elsevier Interactive Patient Education  2017 [employer].

## 2016-11-27 NOTE — Progress Notes (Signed)
Subjective:    Patient ID: Debra White, female    DOB: May 21, 1934, 83 y.o.   MRN: [id number]  HPI   Patient was evaluated in [facility] on 11/10/2016 for CP with a negative cardiac w/u and CXR. She was given a GI cocktail with prompt resolution of her sx's and dx'd with GERD and Rx'd Protonix and released. Since home she has done well w/o recurrence of sx's and adamantly denies any CP, palpitation, dyspnea, edema or GI sx's as water brash or reflux.   Medication Sig  . acetaminophen (TYLENOL) 500 MG tablet Take 500-1,000 mg by mouth every 6 (six) hours as needed for headache.  Marland Kitchen atenolol (TENORMIN) 100 MG tablet TAKE 1 TABLET BY MOUTH EVERY MORNING (Patient taking differently: TAKE 1/2 TABLET (50mg ) BY MOUTH EVERY MORNING)  . cholecalciferol (VITAMIN D) 1000 UNITS tablet Take 4,000 Units by mouth daily.   . clonazePAM (KLONOPIN) 1 MG tablet TAKE 1/2-1 TABLET BY MOUTH UP TO 3 TIMESDAILY IF NEEDED FOR NERVES (Patient taking differently: TAKE 1/2-1 TABLET (0.5mg  to 1mg ) BY MOUTH UP TO 3 TIMES DAILY IF NEEDED FOR NERVES)  . erythromycin ophthalmic ointment Place 1 application into both eyes 2 (two) times daily. To the incisions for 7 days  . gabapentin 300 MG capsule TAKE 1 CAP 3-4 x DAILY FOR FACIAL PAIN   . hyoscyamine SL 0.125 MG SL  Dissolve one tablet by mouth every 4-6 hours as needed)  . losartan 100 MG tablet TAKE 1/2 TABLET BY MOUTH DAILY   . MAXITROLSUSP Place 1 drop into both eyes 2 (two) times daily. For two weeks  . pantoprazole (20 MG tablet Take 1 tablet (20 mg total) by mouth daily.  . pravastatin (PRAVACHOL) 40 MG tablet TAKE 1 TABLET BY MOUTH ONCE DAILY (Patient taking differently: TAKE 1/2 TABLET (20mg ) BY MOUTH ONCE DAILY)  . ranitidine  300 MG tablet Take 150 mg by mouth 2 (two) times daily.   . traMADol  50 MG tablet TAKE 1 TAB 4 x DAILY AS NEEDED FOR PAIN    Allergies  Allergen Reactions  . Latex Hives    TONGUE HAD BLISTERS WHEN HAD DENTAL SURGERY  . Amoxicillin  Nausea And Vomiting  . Aspirin     Gi upset  . Barbiturates   . Celebrex [Celecoxib]   . Codeine Itching  . Evista [Raloxifene]   . Lipitor [Atorvastatin]   . Morphine And Related   . Oruvail [Ketoprofen]   . Pantoprazole Diarrhea  . Paraffin   . Prochlorperazine Other (See Comments)    Uncontrolled shaking  . Proloprim [Trimethoprim]   . Venlafaxine Nausea Only  . Vibra-Tab [Doxycycline]   . Vioxx [Rofecoxib]     edema  . Betadine [Povidone Iodine] Rash  . Caffeine Palpitations   Past Medical History:  Diagnosis Date  . Anxiety   . Arthritis   . Blood transfusion   . Candida esophagitis ([facility]) 2013   EGD  . Cataract   . Chronic kidney disease    STONES  . Diverticulosis of colon (without mention of hemorrhage) 2007   Colonoscopy  . Dysrhythmia    RX  . Emphysema of lung ([facility])   . Family history of colon cancer    sister  . Fibromyalgia   . GERD (gastroesophageal reflux disease)   . Headache(784.0)   . Hyperlipemia   . Hypertension   . Kidney stones   . Ptosis, bilateral    , Past Surgical History:  Procedure Laterality Date  .  ABDOMINAL HYSTERECTOMY    . BACK SURGERY     X2  . BACK SURGERY    . CERVICAL DISC SURGERY    . COLONOSCOPY    . FACIAL FRACTURE SURGERY    . HAND SURGERY     BIL   . ORIF TRIPOD FRACTURE  07/13/2011   Procedure: OPEN REDUCTION INTERNAL FIXATION (ORIF) TRIPOD FRACTURE;  Surgeon: Tyson Alias, MD;  Location: [facility];  Service: ENT;  Laterality: Right;  ORIF RIGHT ZYGOMA, ORBITAL FLOOR EXPLORATION WITH FROST STITCH (TEMPORARY TARSORRHAPHY)  . PTOSIS REPAIR Bilateral 11/22/2016   Procedure: INTERNAL PTOSIS REPAIR;  Surgeon: Clista Bernhardt, MD;  Location: [facility];  Service: Ophthalmology;  Laterality: Bilateral;  . SHOULDER ARTHROSCOPY W/ ROTATOR CUFF REPAIR     LFT   Review of Systems  10 point systems review negative except as above.    Objective:   Physical Exam  BP 116/74   Pulse 60   Temp 97.3 F (36.3 C)   Resp 16    Ht 5\' 3"  (1.6 m)   Wt 109 lb 9.6 oz (49.7 kg)   BMI 19.41 kg/m   HEENT - Eac's patent. TM's Nl. EOM's full. PERRLA. NasoOroPharynx clear. Neck - supple. Nl Thyroid. Carotids 2+ & No bruits, nodes, JVD Chest - Clear equal BS w/o Rales, rhonchi, wheezes. Cor - Nl HS. RRR w/o sig MGR. PP 1(+). No edema. Abd - No palpable organomegaly, masses or tenderness. BS nl. MS- FROM w/o deformities. Muscle power, tone and bulk Nl. Gait Nl. Neuro -  Nl w/o focal abnormalities.    Assessment & Plan:   1. Gastroesophageal reflux disease  - Advised anti-dyspeptic diet and to continue Pantoprazole qam and ranitidine 150 mg qhs for the time being.

## 2016-11-28 ENCOUNTER — Ambulatory Visit (INDEPENDENT_AMBULATORY_CARE_PROVIDER_SITE_OTHER): Payer: PPO | Admitting: Family

## 2016-12-28 ENCOUNTER — Encounter (INDEPENDENT_AMBULATORY_CARE_PROVIDER_SITE_OTHER): Payer: Self-pay | Admitting: Family

## 2016-12-28 ENCOUNTER — Ambulatory Visit (INDEPENDENT_AMBULATORY_CARE_PROVIDER_SITE_OTHER): Payer: PPO | Admitting: Family

## 2016-12-28 VITALS — Ht 63.0 in | Wt 109.0 lb

## 2016-12-28 DIAGNOSIS — M1711 Unilateral primary osteoarthritis, right knee: Secondary | ICD-10-CM | POA: Diagnosis not present

## 2016-12-28 DIAGNOSIS — M5441 Lumbago with sciatica, right side: Secondary | ICD-10-CM

## 2016-12-28 DIAGNOSIS — M7541 Impingement syndrome of right shoulder: Secondary | ICD-10-CM | POA: Diagnosis not present

## 2016-12-28 DIAGNOSIS — G8929 Other chronic pain: Secondary | ICD-10-CM

## 2016-12-28 NOTE — Progress Notes (Signed)
Office Visit Note   Patient: Debra White           Date of Birth: Nov 04, 1933           MRN: [id number] Visit Date: 12/28/2016              Requested by: Unk Pinto, [address] [place], [place] [address] PCP: Unk Pinto, MD   Assessment & Plan: Visit Diagnoses:  1. Impingement syndrome of right shoulder   2. Primary osteoarthritis of right knee     Plan: Injections today.  Have refered to Dr. Ernestina Patches for eval and Possible injection.  Follow-Up Instructions: Return in about 4 weeks (around 01/25/2017), or if symptoms worsen or fail to improve.   Orders:  No orders of the defined types were placed in this encounter.  No orders of the defined types were placed in this encounter.     Procedures: Large Joint Inj Date/Time: 12/28/2016 10:01 AM Performed by: Suzan Slick Authorized by: Dondra Prader R   Consent Given by:  Patient Site marked: the procedure site was marked   Timeout: prior to procedure the correct patient, procedure, and site was verified   Indications:  Pain and diagnostic evaluation Location:  Knee Site:  R knee Needle Size:  22 G Needle Length:  1.5 inches Ultrasound Guidance: No   Fluoroscopic Guidance: No   Arthrogram: No   Medications:  5 mL lidocaine 1 %; 40 mg methylPREDNISolone acetate 40 MG/ML Aspiration Attempted: No   Patient tolerance:  Patient tolerated the procedure well with no immediate complications Large Joint Inj Date/Time: 12/28/2016 10:01 AM Performed by: Suzan Slick Authorized by: Dondra Prader R   Consent Given by:  Patient Site marked: the procedure site was marked   Timeout: prior to procedure the correct patient, procedure, and site was verified   Indications:  Pain and diagnostic evaluation Location:  Shoulder Site:  R subacromial bursa Prep: patient was prepped and draped in usual sterile fashion   Needle Size:  22 G Needle Length:  1.5 inches Ultrasound Guidance: No   Fluoroscopic  Guidance: No   Arthrogram: No   Medications:  5 mL lidocaine 1 %; 40 mg methylPREDNISolone acetate 40 MG/ML Aspiration Attempted: No   Patient tolerance:  Patient tolerated the procedure well with no immediate complications     Clinical Data: No additional findings.   Subjective: Chief Complaint  Patient presents with  . Right Shoulder - Pain  . Right Knee - Pain    Patient is an 83 year old woman who presents today for three separate issues, chronic right shoulder pain, right knee pain and right sided hip/buttock, low back, thigh pain.   She complains of persistent right shoulder pain.  Last had DepoMedrol 2 month ago, states this was helpful. Is hopeful for repeat injection today. Complains of aching pain this is worse at night. It is constant. Radiates down her upper arm. Does not have loss of range of motion. She is able to reach above head this is painful however. Complains of worsening of shoulder pain with increased work around the home as she is the primary caregiver for her husband. Would like to defer surgical intervention at this time.  second problem is right-sided low back pain. Complains of associated hip, buttock, lateral and anterior thigh pain. This has been ongoing for months to years is been well much worse over last 4 weeks. Discussed possibility of using prednisone. She is resistant first. States  that she has an upset stomach she takes prednisone. Does relate that she's had set 3 back surgeries. Does state she has had one epidural steroid injection with Dr. Ernestina Patches would like to defer this option.    Review of Systems  Constitutional: Negative for chills and fever.  Musculoskeletal: Positive for arthralgias.     Objective: Vital Signs: Ht 5\' 3"  (1.6 m)   Wt 109 lb (49.4 kg)   BMI 19.31 kg/m   Physical Exam  Constitutional: She is oriented to person, place, and time. She appears well-developed and well-nourished.  Pulmonary/Chest: Effort normal.    Musculoskeletal:       Right knee: She exhibits no effusion.  Neurological: She is alert and oriented to person, place, and time.  Psychiatric: She has a normal mood and affect.  Nursing note reviewed.   Right Knee Exam   Tenderness  The patient is experiencing tenderness in the lateral joint line and medial joint line.  Range of Motion  The patient has normal right knee ROM.  Tests  Varus: negative Valgus: negative  Other  Erythema: absent Swelling: mild Other tests: no effusion present   Right Hip Exam  Right hip exam is normal.   Tenderness  The patient is experiencing no tenderness.     Range of Motion  The patient has normal right hip ROM.  Tests  FABER: negative   Back Exam   Tenderness  The patient is experiencing no tenderness.   Tests  Straight leg raise right: negative Straight leg raise left: negative  Other  Gait: normal    Right Shoulder Exam   Tenderness  The patient is experiencing tenderness in the biceps tendon.  Range of Motion  The patient has normal right shoulder ROM.  Muscle Strength  The patient has normal right shoulder strength.  Tests  Hawkin's test: positive Impingement: positive  Other  Erythema: absent      Specialty Comments:  No specialty comments available.  Imaging: No results found.   PMFS History: Patient Active Problem List   Diagnosis Date Noted  . New onset headache 10/15/2016  . Impingement syndrome of right shoulder 09/25/2016  . Impingement syndrome of left shoulder 09/25/2016  . COPD (chronic obstructive pulmonary disease) with emphysema ([facility]) 12/21/2015  . 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 Osteoporosis 12/21/2015  . Mild malnutrition ([employer]) 05/11/2015  . Encounter for Medicare annual wellness exam 04/08/2015  . Prediabetes 01/27/2014  . Medication management 01/27/2014  . 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 Vitamin D deficiency 07/08/2013  . Hypertension   . Hyperlipidemia    . GERD   . Headache, post-traumatic, chronic 09/20/2011  . Fracture, zygoma closed ([facility]) 07/13/2011   Past Medical History:  Diagnosis Date  . Anxiety   . Arthritis   . Blood transfusion   . Candida esophagitis ([facility]) 2013   EGD  . Cataract   . Chronic kidney disease    STONES  . Diverticulosis of colon (without mention of hemorrhage) 2007   Colonoscopy  . Dysrhythmia    RX  . Emphysema of lung ([facility])   . Family history of colon cancer    sister  . Fibromyalgia   . GERD (gastroesophageal reflux disease)   . Headache(784.0)   . Hyperlipemia   . Hypertension   . Kidney stones   . Ptosis, bilateral     Family History  Problem Relation Age of Onset  . Heart disease Mother   . Heart disease Father   . Breast  cancer Sister   . Colon cancer Sister   . Cancer Sister        breast  . Cancer Sister        colon  . Cancer Sister        colon  . Hypertension Daughter   . Hyperlipidemia Daughter     Past Surgical History:  Procedure Laterality Date  . ABDOMINAL HYSTERECTOMY    . BACK SURGERY     X2  . BACK SURGERY    . CERVICAL DISC SURGERY    . COLONOSCOPY    . FACIAL FRACTURE SURGERY    . HAND SURGERY     BIL   . ORIF TRIPOD FRACTURE  07/13/2011   Procedure: OPEN REDUCTION INTERNAL FIXATION (ORIF) TRIPOD FRACTURE;  Surgeon: Tyson Alias, MD;  Location: [facility];  Service: ENT;  Laterality: Right;  ORIF RIGHT ZYGOMA, ORBITAL FLOOR EXPLORATION WITH FROST STITCH (TEMPORARY TARSORRHAPHY)  . PTOSIS REPAIR Bilateral 11/22/2016   Procedure: INTERNAL PTOSIS REPAIR;  Surgeon: Clista Bernhardt, MD;  Location: [facility];  Service: Ophthalmology;  Laterality: Bilateral;  . SHOULDER ARTHROSCOPY W/ ROTATOR CUFF REPAIR     LFT   Social History   Occupational History  . Retired Retired   Social History Main Topics  . Smoking status: Never Smoker  . Smokeless tobacco: Never Used  . Alcohol use No  . Drug use: No  . Sexual activity: Not on file     Comment: HYSTER

## 2016-12-31 MED ORDER — LIDOCAINE HCL 1 % IJ SOLN
5.0000 mL | INTRAMUSCULAR | Status: AC | PRN
  Administered 2016-12-28: 5 mL

## 2016-12-31 MED ORDER — METHYLPREDNISOLONE ACETATE 40 MG/ML IJ SUSP
40.0000 mg | INTRAMUSCULAR | Status: AC | PRN
  Administered 2016-12-28: 40 mg via INTRA_ARTICULAR

## 2017-01-02 ENCOUNTER — Ambulatory Visit: Payer: PPO | Admitting: Gastroenterology

## 2017-01-14 ENCOUNTER — Other Ambulatory Visit: Payer: Self-pay | Admitting: Internal Medicine

## 2017-01-21 ENCOUNTER — Ambulatory Visit (INDEPENDENT_AMBULATORY_CARE_PROVIDER_SITE_OTHER): Payer: PPO

## 2017-01-21 ENCOUNTER — Encounter (INDEPENDENT_AMBULATORY_CARE_PROVIDER_SITE_OTHER): Payer: Self-pay | Admitting: Physical Medicine and Rehabilitation

## 2017-01-21 ENCOUNTER — Ambulatory Visit: Payer: PPO | Admitting: Cardiovascular Disease

## 2017-01-21 ENCOUNTER — Ambulatory Visit (INDEPENDENT_AMBULATORY_CARE_PROVIDER_SITE_OTHER): Payer: PPO | Admitting: Physical Medicine and Rehabilitation

## 2017-01-21 VITALS — BP 126/65 | HR 55

## 2017-01-21 DIAGNOSIS — M461 Sacroiliitis, not elsewhere classified: Secondary | ICD-10-CM | POA: Diagnosis not present

## 2017-01-21 DIAGNOSIS — M797 Fibromyalgia: Secondary | ICD-10-CM | POA: Diagnosis not present

## 2017-01-21 DIAGNOSIS — M961 Postlaminectomy syndrome, not elsewhere classified: Secondary | ICD-10-CM

## 2017-01-21 DIAGNOSIS — G894 Chronic pain syndrome: Secondary | ICD-10-CM

## 2017-01-21 DIAGNOSIS — M7061 Trochanteric bursitis, right hip: Secondary | ICD-10-CM

## 2017-01-21 NOTE — Progress Notes (Deleted)
Debra White - 83 y.o. female MRN [id number]  Date of birth: 12-31-1933  Office Visit Note: Visit Date: 01/21/2017 PCP: Unk Pinto, MD Referred by: Unk Pinto, MD  Subjective: No chief complaint on file.  HPI: HPI  ROS Otherwise per HPI.  Assessment & Plan: Visit Diagnoses:  1. Sacroiliitis ([facility])     Plan: No additional findings.   Meds & Orders: No orders of the defined types were placed in this encounter.  No orders of the defined types were placed in this encounter.   Follow-up: No Follow-up on file.   Procedures: Large Joint Inj Date/Time: 01/21/2017 11:02 AM Performed by: Magnus Sinning Authorized by: Magnus Sinning   Consent Given by:  Parent Site marked: the procedure site was marked   Timeout: prior to procedure the correct patient, procedure, and site was verified   Indications:  Pain and diagnostic evaluation Location:  Hip Site:  R greater trochanter Prep: patient was prepped and draped in usual sterile fashion   Needle Size:  22 G Needle Length:  3.5 inches Approach:  Lateral Ultrasound Guidance: No   Fluoroscopic Guidance: Yes   Arthrogram: No   Medications:  80 mg triamcinolone acetonide 40 MG/ML; 4 mL lidocaine 2 %; 4 mL bupivacaine 0.25 % Aspiration Attempted: No   Patient tolerance:  Patient tolerated the procedure well with no immediate complications  There was excellent flow of contrast outlined the greater trochanteric bursa without vascular uptake.    No notes on file   Clinical History: No specialty comments available.  She reports that she has never smoked. She has never used smokeless tobacco.  Recent Labs  03/30/16 1110 04/05/16 1247 10/29/16 0946  HGBA1C  --  5.4 5.2  LABURIC 3.4  --   --     Objective:  VS:  HT:    WT:   BMI:     BP:   HR: bpm  TEMP: ( )  RESP:  Physical Exam  Ortho Exam Imaging: No results found.  Past Medical/Family/Surgical/Social History: Medications & Allergies reviewed per  EMR Patient Active Problem List   Diagnosis Date Noted  . New onset headache 10/15/2016  . Impingement syndrome of right shoulder 09/25/2016  . Impingement syndrome of left shoulder 09/25/2016  . COPD (chronic obstructive pulmonary disease) with emphysema ([facility]) 12/21/2015  . 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 Osteoporosis 12/21/2015  . Mild malnutrition ([facility]) 05/11/2015  . Encounter for Medicare annual wellness exam 04/08/2015  . Prediabetes 01/27/2014  . Medication management 01/27/2014  . 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 Vitamin D deficiency 07/08/2013  . Hypertension   . Hyperlipidemia   . GERD   . Headache, post-traumatic, chronic 09/20/2011  . Fracture, zygoma closed ([facility]) 07/13/2011   Past Medical History:  Diagnosis Date  . Anxiety   . Arthritis   . Blood transfusion   . Candida esophagitis ([facility]) 2013   EGD  . Cataract   . Chronic kidney disease    STONES  . Diverticulosis of colon (without mention of hemorrhage) 2007   Colonoscopy  . Dysrhythmia    RX  . Emphysema of lung ([facility])   . Family history of colon cancer    sister  . Fibromyalgia   . GERD (gastroesophageal reflux disease)   . Headache(784.0)   . Hyperlipemia   . Hypertension   . Kidney stones   . Ptosis, bilateral    Family History  Problem Relation Age of Onset  . Heart disease Mother   .  Heart disease Father   . Breast cancer Sister   . Colon cancer Sister   . Cancer Sister        breast  . Cancer Sister        colon  . Cancer Sister        colon  . Hypertension Daughter   . Hyperlipidemia Daughter    Past Surgical History:  Procedure Laterality Date  . ABDOMINAL HYSTERECTOMY    . BACK SURGERY     X2  . BACK SURGERY    . CERVICAL DISC SURGERY    . COLONOSCOPY    . FACIAL FRACTURE SURGERY    . HAND SURGERY     BIL   . ORIF TRIPOD FRACTURE  07/13/2011   Procedure: OPEN REDUCTION INTERNAL FIXATION (ORIF) TRIPOD FRACTURE;  Surgeon: Tyson Alias, MD;  Location: [facility];  Service:  ENT;  Laterality: Right;  ORIF RIGHT ZYGOMA, ORBITAL FLOOR EXPLORATION WITH FROST STITCH (TEMPORARY TARSORRHAPHY)  . PTOSIS REPAIR Bilateral 11/22/2016   Procedure: INTERNAL PTOSIS REPAIR;  Surgeon: Clista Bernhardt, MD;  Location: [facility];  Service: Ophthalmology;  Laterality: Bilateral;  . SHOULDER ARTHROSCOPY W/ ROTATOR CUFF REPAIR     LFT   Social History   Occupational History  . Retired Retired   Social History Main Topics  . Smoking status: Never Smoker  . Smokeless tobacco: Never Used  . Alcohol use No  . Drug use: No  . Sexual activity: Not on file     Comment: HYSTER

## 2017-01-21 NOTE — Progress Notes (Unsigned)
Fluoro Time: 19 sec MGY: 20.53

## 2017-01-21 NOTE — Progress Notes (Deleted)
Pain in both buttocks and down legs to knee. Worse on right side. Buttock pain worse with sitting. Right posterior lateral hip to knee. No pain on rotation, negativ patricks. States prior SI joints not effective and knee joint by Coralyn Pear not effective.

## 2017-01-21 NOTE — Patient Instructions (Addendum)
Over the counter lidocaine patch to area of pain 8 to 12 hours then remove patch.   [employer] Discharge Instructions  *At any time if you have questions or concerns they can be answered by calling [phone]  All Patients: . You may experience an increase in your symptoms for the first 2 days (it can take 2 days to 2 weeks for the steroid/cortisone to have its maximal effect). . You may use ice to the site for the first 24 hours; 20 minutes on and 20 minutes off and may use heat after that time. . You may resume and continue your current pain medications. If you need a refill please contact the prescribing physician. . You may resume her regular medications if any were stopped for the procedure. . You may shower but no swimming, tub bath or Jacuzzi for 24 hours. . Please remove bandage after 4 hours. . You may resume light activities as tolerated. . If you had Spine Injection, you should not drive for the next 3 hours due to anesthetics used in the procedure. Please have someone drive for you.  *If you have had sedation, Valium, Xanax, or lorazepam: Do not drive or use public transportation for 24 hours, do not operating hazardous machinery or make important personal/business decisions for 24 hours.  POSSIBLE STEROID SIDE EFFECTS: If experienced these should only last for a short period. Change in menstrual flow  Edema in (swelling)  Increased appetite Skin flushing (redness)  Skin rash/acne  Thrush (oral) Vaginitis    Increased sweating  Depression Increased blood glucose levels Cramping and leg/calf  Euphoria (feeling happy)  POSSIBLE PROCEDURE SIDE EFFECTS: Please call our office if concerned. Increased pain Increased numbness/tingling  Headache Nausea/vomiting Hematoma (bruising/bleeding) Edema (swelling at the site) Weakness  Infection (red/drainage at site) Fever greater than 100.29F  *In the event of a headache after epidural steroid injection: Drink plenty  of fluids, especially water and try to lay flat when possible. If the headache does not get better after a few days or as always if concerned please call the office.

## 2017-01-23 ENCOUNTER — Encounter (INDEPENDENT_AMBULATORY_CARE_PROVIDER_SITE_OTHER): Payer: Self-pay | Admitting: Physical Medicine and Rehabilitation

## 2017-01-23 DIAGNOSIS — M797 Fibromyalgia: Secondary | ICD-10-CM | POA: Diagnosis not present

## 2017-01-23 DIAGNOSIS — G894 Chronic pain syndrome: Secondary | ICD-10-CM | POA: Diagnosis not present

## 2017-01-23 DIAGNOSIS — M7061 Trochanteric bursitis, right hip: Secondary | ICD-10-CM | POA: Diagnosis not present

## 2017-01-23 DIAGNOSIS — M461 Sacroiliitis, not elsewhere classified: Secondary | ICD-10-CM | POA: Diagnosis not present

## 2017-01-23 DIAGNOSIS — M961 Postlaminectomy syndrome, not elsewhere classified: Secondary | ICD-10-CM | POA: Diagnosis not present

## 2017-01-23 MED ORDER — LIDOCAINE HCL 2 % IJ SOLN
4.0000 mL | INTRAMUSCULAR | Status: AC | PRN
Start: 2017-01-23 — End: 2017-01-23
  Administered 2017-01-23: 4 mL

## 2017-01-23 MED ORDER — TRIAMCINOLONE ACETONIDE 40 MG/ML IJ SUSP
80.0000 mg | INTRAMUSCULAR | Status: AC | PRN
  Administered 2017-01-23: 80 mg via INTRA_ARTICULAR

## 2017-01-23 MED ORDER — BUPIVACAINE HCL 0.25 % IJ SOLN
4.0000 mL | INTRAMUSCULAR | Status: AC | PRN
  Administered 2017-01-23: 4 mL via INTRA_ARTICULAR

## 2017-01-23 NOTE — Progress Notes (Signed)
Debra White - 83 y.o. female MRN [id number]  Date of birth: Nov 03, 1933  Office Visit Note: Visit Date: 01/21/2017 PCP: Unk Pinto, MD Referred by: Unk Pinto, MD  Subjective: Chief Complaint  Patient presents with  . Lower Back - Pain   HPI: Debra White is an 83 year old female with prior lumbar fusion from L3 through the sacrum with history of chronic back pain and chronic pain syndrome and fibromyalgia. We have seen her in the past or bilateral at times sacroiliac joint injections. She reports increasing pain in the bilateral low back bilateral lateral hips and thighs and posterior buttock. She reports prior sacroiliac joints never gave her any relief even though we've done those on many occasions with documented examples of her saying it helped. She has had recent knee injection by Dondra Prader, FN-P. The patient wants Vicodin do anything about her knee pain. She has multiple somatic pain complaints. Again her biggest complaint is over the lateral right hip and posterior buttock with referral on the thigh. No numbness tingling or paresthesias no focal weakness. She's not gotten relief with medication management. She has not had recent physical therapy but is having in the past. 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 Symptoms are constant but somewhat worse with movement.    Review of Systems  Constitutional: Positive for malaise/fatigue. Negative for chills, fever and weight loss.  HENT: Negative for hearing loss and sinus pain.   Eyes: Negative for blurred vision, double vision and photophobia.  Respiratory: Negative for cough and shortness of breath.   Cardiovascular: Negative for chest pain, palpitations and leg swelling.  Gastrointestinal: Negative for abdominal pain, nausea and vomiting.  Genitourinary: Negative for flank pain.  Musculoskeletal: Positive for back pain and joint pain. Negative for myalgias.  Skin: Negative for itching and rash.    Neurological: Positive for weakness and headaches. Negative for tremors and focal weakness.  Endo/Heme/Allergies: Negative.   Psychiatric/Behavioral: Negative for depression.  All other systems reviewed and are negative.  Otherwise per HPI.  Assessment & Plan: Visit Diagnoses:  1. Greater trochanteric bursitis, right   2. Sacroiliitis ([place])   3. Post laminectomy syndrome   4. Chronic pain syndrome   5. Fibromyalgia     Plan: Findings:  Diagnostic and hopefully therapeutic right greater trochanteric bursa injection with fluoroscopic guidance. If she doesn't get much relief with that I would suggest a sacroiliac joint injection as a repeat as we have documented that his helped in the past but she says today but never help much. Otherwise am not really sure how to help her continued complaints of multiple areas of pain. I think she needs studies activities she can this may be related obviously to fibromyalgia as well.    Meds & Orders: No orders of the defined types were placed in this encounter.   Orders Placed This Encounter  Procedures  . Large Joint Injection/Arthrocentesis  . Large Joint Injection/Arthrocentesis  . XR C-ARM NO REPORT    Follow-up: Return if symptoms worsen or fail to improve, for Dr. Luella Cook.   Procedures: Large Joint Inj Date/Time: 01/23/2017 5:28 AM Performed by: Magnus Sinning Authorized by: Magnus Sinning   Consent Given by:  Parent Site marked: the procedure site was marked   Timeout: prior to procedure the correct patient, procedure, and site was verified   Indications:  Pain and diagnostic evaluation Location:  Hip Site:  R greater trochanter Prep: patient was prepped and draped in usual sterile fashion  Needle Size:  22 G Needle Length:  3.5 inches Approach:  Lateral Ultrasound Guidance: No   Fluoroscopic Guidance: Yes   Arthrogram: No   Medications:  80 mg triamcinolone acetonide 40 MG/ML; 4 mL lidocaine 2 %; 4 mL  bupivacaine 0.25 % Aspiration Attempted: No   Patient tolerance:  Patient tolerated the procedure well with no immediate complications  There was excellent flow of contrast outlined the greater trochanteric bursa without vascular uptake.    No notes on file   Clinical History: MRI LUMBAR SPINE WITHOUT CONTRAST  TECHNIQUE: Multiplanar, multisequence MR imaging of the lumbar spine was performed. No intravenous contrast was administered.  COMPARISON:  CT scan dated 04/13/2015 and MRIs dated 08/27/2014 and 08/15/2012  FINDINGS: Normal conus tip at T12-L1. No significant abnormality of the paraspinal soft tissues.  T12-L1: Slight disc degeneration with a tiny broad-based disc bulge with no neural impingement, unchanged.  L1-2: Chronic unchanged deformity of the superior endplate of L2. The disc is otherwise essentially normal. No change.  L2-3: Tiny disc bulge into the right neural foramen with no neural impingement, unchanged. Minimal degenerative changes of the facet joints.  L3-4 through L5-S1: Solid fusion posteriorly. The L3-4 disc is normal. Solid fusion of the L4 and L5 vertebra to 1 another. Chronic stable spondylolisthesis deformity at L4-5.  The L5-S1 disc is normal. The neural foramina are widely patent throughout the lumbar spine.  IMPRESSION: No spinal or foraminal stenosis or disc protrusions or neural impingement. Stable solid fusion from L3-S1.  No change since the prior exam.   Electronically Signed   By: Lorriane Shire M.D.   On: 10/28/2015 09:46  She reports that she has never smoked. She has never used smokeless tobacco.   Recent Labs  03/30/16 1110 04/05/16 1247 10/29/16 0946  HGBA1C  --  5.4 5.2  LABURIC 3.4  --   --     Objective:  VS:  HT:    WT:   BMI:     BP:126/65  HR:(!) 55bpm  TEMP: ( )  RESP:  Physical Exam  Constitutional: She is oriented to person, place, and time. She appears well-developed and well-nourished.   Eyes: Pupils are equal, round, and reactive to light. Conjunctivae and EOM are normal.  Cardiovascular: Normal rate and intact distal pulses.   Pulmonary/Chest: Effort normal.  Musculoskeletal:  Patient is slow to rise from a seated position. She ambulates without aid. She has good distal strength. She has no groin pain with hip rotation. She has a positive Fortin finger test to the right. She has pain over the greater trochanters bilaterally but she also has positive tender points across the skin with light palpation.  Neurological: She is alert and oriented to person, place, and time. She exhibits normal muscle tone.  Skin: Skin is warm and dry. No rash noted. No erythema.  Psychiatric: Her behavior is normal.  Anxious appearing  Nursing note and vitals reviewed.   Ortho Exam Imaging: No results found.  Past Medical/Family/Surgical/Social History: Medications & Allergies reviewed per EMR Patient Active Problem List   Diagnosis Date Noted  . New onset headache 10/15/2016  . Impingement syndrome of right shoulder 09/25/2016  . Impingement syndrome of left shoulder 09/25/2016  . COPD (chronic obstructive pulmonary disease) with emphysema ([place]) 12/21/2015  . 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 Osteoporosis 12/21/2015  . Mild malnutrition ([place]) 05/11/2015  . Encounter for Medicare annual wellness exam 04/08/2015  . Prediabetes 01/27/2014  . Medication management 01/27/2014  . Atypical  facial pain 10/08/2013  . Vitamin D deficiency 07/08/2013  . Hypertension   . Hyperlipidemia   . GERD   . Headache, post-traumatic, chronic 09/20/2011  . Fracture, zygoma closed ([place]) 07/13/2011   Past Medical History:  Diagnosis Date  . Anxiety   . Arthritis   . Blood transfusion   . Candida esophagitis ([place]) 2013   EGD  . Cataract   . Chronic kidney disease    STONES  . Diverticulosis of colon (without mention of hemorrhage) 2007   Colonoscopy  . Dysrhythmia    RX  . Emphysema of lung  ([place])   . Family history of colon cancer    sister  . Fibromyalgia   . GERD (gastroesophageal reflux disease)   . Headache(784.0)   . Hyperlipemia   . Hypertension   . Kidney stones   . Ptosis, bilateral    Family History  Problem Relation Age of Onset  . Heart disease Mother   . Heart disease Father   . Breast cancer Sister   . Colon cancer Sister   . Cancer Sister        breast  . Cancer Sister        colon  . Cancer Sister        colon  . Hypertension Daughter   . Hyperlipidemia Daughter    Past Surgical History:  Procedure Laterality Date  . ABDOMINAL HYSTERECTOMY    . BACK SURGERY     X2  . BACK SURGERY    . CERVICAL DISC SURGERY    . COLONOSCOPY    . FACIAL FRACTURE SURGERY    . HAND SURGERY     BIL   . ORIF TRIPOD FRACTURE  07/13/2011   Procedure: OPEN REDUCTION INTERNAL FIXATION (ORIF) TRIPOD FRACTURE;  Surgeon: Tyson Alias, MD;  Location: [facility];  Service: ENT;  Laterality: Right;  ORIF RIGHT ZYGOMA, ORBITAL FLOOR EXPLORATION WITH FROST STITCH (TEMPORARY TARSORRHAPHY)  . PTOSIS REPAIR Bilateral 11/22/2016   Procedure: INTERNAL PTOSIS REPAIR;  Surgeon: Clista Bernhardt, MD;  Location: [facility];  Service: Ophthalmology;  Laterality: Bilateral;  . SHOULDER ARTHROSCOPY W/ ROTATOR CUFF REPAIR     LFT   Social History   Occupational History  . Retired Retired   Social History Main Topics  . Smoking status: Never Smoker  . Smokeless tobacco: Never Used  . Alcohol use No  . Drug use: No  . Sexual activity: Not on file     Comment: HYSTER

## 2017-02-04 NOTE — Progress Notes (Signed)
Medicare annual wellness visit and OV  Assessment and Plan:   Essential hypertension - continue medications, DASH diet, exercise and monitor at home. Call if greater than 130/80.  -     CBC with Differential/Platelet -     BASIC METABOLIC PANEL WITH GFR -     Hepatic function panel -     TSH  Tortuous aorta (HCC) Control blood pressure, cholesterol, glucose, increase exercise.   Pulmonary emphysema, unspecified emphysema type ([place])  continue meds, avoid triggers   Mixed hyperlipidemia -continue medications, check lipids, decrease fatty foods, increase activity.  -     Lipid panel  Medication management -     Magnesium  Mild malnutrition (HCC) Add ensure/boost/protein  Gastroesophageal reflux disease, esophagitis presence not specified Continue PPI/H2 blocker, diet discussed Follow up GI  Osteoporosis, unspecified osteoporosis type, unspecified pathological fracture presence get dexa, continue Vit D and Ca, weight bearing exercises  Closed fracture of zygoma, sequela (HCC) -     traMADol (ULTRAM) 50 MG tablet; TAKE 1 TABLET BY MOUTH 4 TIMES DAILY AS NEEDED FOR PAIN  Prediabetes Discussed general issues about diabetes pathophysiology and management., Educational material distributed., Suggested low cholesterol diet., Encouraged aerobic exercise., Discussed foot care., Reminded to get yearly retinal exam.  Vitamin D deficiency  Chronic post-traumatic headache, not intractable Continue follow up neuro -     traMADol (ULTRAM) 50 MG tablet; TAKE 1 TABLET BY MOUTH 4 TIMES DAILY AS NEEDED FOR PAIN  Atypical facial pain -     traMADol (ULTRAM) 50 MG tablet; TAKE 1 TABLET BY MOUTH 4 TIMES DAILY AS NEEDED FOR PAIN  Encounter for Medicare annual wellness exam  Advanced care counseling/discussion Discussed advanced care planning with patient  Fracture, zygoma closed, sequela (HCC) Follow up neuro -     traMADol (ULTRAM) 50 MG tablet; TAKE 1 TABLET BY MOUTH 4 TIMES DAILY AS  NEEDED FOR PAIN  Continue diet and meds as discussed. Further disposition pending results of labs.  During the course of the visit the patient was educated and counseled about appropriate screening and preventive services including:    Pneumococcal vaccine   Influenza vaccine  Td vaccine  Screening electrocardiogram  Bone densitometry screening  Colorectal cancer screening  Diabetes screening  Glaucoma screening  Nutrition counseling   Advanced directives: requested   HPI 83 y.o. female  presents for 6 month follow up with hypertension, hyperlipidemia, prediabetes and vitamin D. Patient fell Jan 2013 and sustained a right orbital fx and was seen by Dr Erie Noe, now follows with Dr. Delice Lesch. Patient has atypical right facial pain and has had Neuro consultation for chronic atypical right facial pain. She relates poor sense of smell & taste and consequent poor oral intake and loss of weight. Takes klonopin for pain and she is on gabapentin.  Her blood pressure has been controlled at home, today their BP is BP: 124/70 She does not workout. She denies chest pain, shortness of breath, dizziness.  She is on cholesterol medication and denies myalgias. Her cholesterol is at goal. The cholesterol last visit was:   Lab Results  Component Value Date   CHOL 179 10/29/2016   HDL 81 10/29/2016   LDLCALC 71 10/29/2016   TRIG 134 10/29/2016   CHOLHDL 2.2 10/29/2016   She has been working on diet and exercise for prediabetes, and denies polydipsia, polyuria and visual disturbances. Last A1C in the office was:  Lab Results  Component Value Date   HGBA1C 5.2 10/29/2016   Patient is  on Vitamin D supplement.   Lab Results  Component Value Date   VD25OH 35 10/29/2016     She also is being followed by Dr Sharol Given for chronic DDD/LBP and has been advised she does not have a surgical problem and she has had epidural steroid injection. She complains of right shoulder pain, has been see Dr.  Durward Fortes. She is very frustrated with her weight, she has been on DM boost but has not been gaining weight.  Wt Readings from Last 3 Encounters:  02/05/17 108 lb 6.4 oz (49.2 kg)  12/28/16 109 lb (49.4 kg)  11/27/16 109 lb 9.6 oz (49.7 kg)    Current Medications:  Current Outpatient Prescriptions on File Prior to Visit  Medication Sig Dispense Refill  . acetaminophen (TYLENOL) 500 MG tablet Take 500-1,000 mg by mouth every 6 (six) hours as needed for headache.    Marland Kitchen atenolol (TENORMIN) 100 MG tablet TAKE 1 TABLET BY MOUTH EVERY MORNING (Patient taking differently: TAKE 1/2 TABLET (50mg ) BY MOUTH EVERY MORNING) 90 tablet 1  . cholecalciferol (VITAMIN D) 1000 UNITS tablet Take 4,000 Units by mouth daily.     . clonazePAM (KLONOPIN) 1 MG tablet TAKE 1/2-1 TABLET BY MOUTH UP TO 3 TIMESDAILY IF NEEDED FOR NERVES (Patient taking differently: TAKE 1/2-1 TABLET (0.5mg  to 1mg ) BY MOUTH UP TO 3 TIMES DAILY IF NEEDED FOR NERVES) 90 tablet 1  . erythromycin ophthalmic ointment Place 1 application into both eyes 2 (two) times daily. To the incisions for 7 days    . gabapentin (NEURONTIN) 300 MG capsule TAKE 1 CAPSULE BY MOUTH 3-4 TIMES DAILY FOR FACIAL PAIN 360 capsule 0  . hyoscyamine (LEVSIN SL) 0.125 MG SL tablet Dissolve one tablet by mouth every 4-6 hours as needed (Patient taking differently: Take 0.125 mg by mouth every 4 (four) hours as needed for cramping. Dissolve one tablet by mouth every 4-6 hours as needed) 60 tablet 3  . losartan (COZAAR) 100 MG tablet TAKE 1/2 TABLET BY MOUTH DAILY (Patient taking differently: TAKE 1/2 TABLET (50mg ) BY MOUTH IN THE EVENING) 90 tablet 1  . pravastatin (PRAVACHOL) 40 MG tablet TAKE 1 TABLET BY MOUTH ONCE DAILY (Patient taking differently: TAKE 1/2 TABLET (20mg ) BY MOUTH ONCE DAILY) 90 tablet 2  . ranitidine (ZANTAC) 300 MG tablet TAKE 1 TABLET BY MOUTH TWICE DAILY AS NEEDED FOR ACID INDIGESTION ANDREFLUX (Patient taking differently: Take 150 mg by mouth 2 (two)  times daily. ) 180 tablet 0  . pantoprazole (PROTONIX) 20 MG tablet Take 1 tablet (20 mg total) by mouth daily. 30 tablet 0  . [DISCONTINUED] omeprazole (PRILOSEC OTC) 20 MG tablet Take 20 mg by mouth daily.     No current facility-administered medications on file prior to visit.    Medical History:  Past Medical History:  Diagnosis Date  . Anxiety   . Arthritis   . Blood transfusion   . Candida esophagitis ([place]) 2013   EGD  . Cataract   . Chronic kidney disease    STONES  . Diverticulosis of colon (without mention of hemorrhage) 2007   Colonoscopy  . Dysrhythmia    RX  . Emphysema of lung ([place])   . Family history of colon cancer    sister  . Fibromyalgia   . GERD (gastroesophageal reflux disease)   . Headache(784.0)   . Hyperlipemia   . Hypertension   . Kidney stones   . Ptosis, bilateral    Allergies Allergies  Allergen Reactions  . Latex Hives  TONGUE HAD BLISTERS WHEN HAD DENTAL SURGERY  . Amoxicillin Nausea And Vomiting  . Aspirin     Gi upset  . Barbiturates   . Celebrex [Celecoxib]   . Codeine Itching  . Evista [Raloxifene]   . Lipitor [Atorvastatin]   . Morphine And Related   . Oruvail [Ketoprofen]   . Pantoprazole Diarrhea  . Paraffin   . Prochlorperazine Other (See Comments)    Uncontrolled shaking  . Proloprim [Trimethoprim]   . Venlafaxine Nausea Only  . Vibra-Tab [Doxycycline]   . Vioxx [Rofecoxib]     edema  . Betadine [Povidone Iodine] Rash  . Caffeine Palpitations   Names of Other Physician/Practitioners you currently use: 1. [facility] here for primary care 2. Dr Luther Hearing, eye doctor, last visit July 2018 3. Dr Olena Heckle, [place], dentist,q 9 months Patient Care Team: Unk Pinto, MD as PCP - General (Internal Medicine) Darleen Crocker, MD as Consulting Physician (Ophthalmology) Ladene Artist, MD as Consulting Physician (Gastroenterology) Cameron Sprang, MD as Consulting Physician  (Neurology) Jodi Marble, MD as Consulting Physician (Otolaryngology)  Immunization History  Administered Date(s) Administered  . Influenza, High Dose Seasonal PF 03/25/2014, 04/05/2016  . Influenza-Unspecified 03/09/2013, 03/11/2015  . Pneumococcal Conjugate-13 12/21/2015  . Pneumococcal Polysaccharide-23 06/25/2002  . Td 06/26/2003    Influenza 2017 Pneumonia 2004 declines Tdap 2005, declines Shingles declines Prevnar 2017  Preventative care: Last colonoscopy: 03/17/2012 MGM screening 07/2016 DEXA 02/2016 + osteoporosis Stress test 2006  SURGICAL HISTORY She  has a past surgical history that includes Abdominal hysterectomy; Hand surgery; Shoulder arthroscopy w/ rotator cuff repair; Cervical disc surgery; Back surgery; ORIF tripod fracture (07/13/2011); Colonoscopy; Facial fracture surgery; Back surgery; and Ptosis repair (Bilateral, 11/22/2016). FAMILY HISTORY Her family history includes Breast cancer in her sister; Cancer in her sister, sister, and sister; Colon cancer in her sister; Heart disease in her father and mother; Hyperlipidemia in her daughter; Hypertension in her daughter. SOCIAL HISTORY She  reports that she has never smoked. She has never used smokeless tobacco. She reports that she does not drink alcohol or use drugs.   MEDICARE WELLNESS OBJECTIVES: Physical activity: Current Exercise Habits: The patient does not participate in regular exercise at present Cardiac risk factors: Cardiac Risk Factors include: advanced age (>74men, >40 women);hypertension;sedentary lifestyle Depression/mood screen:   Depression screen [facility] 2/9 02/05/2017  Decreased Interest 0  Down, Depressed, Hopeless 0  PHQ - 2 Score 0  Altered sleeping -  Tired, decreased energy -  Change in appetite -  Feeling bad or failure about yourself  -  Trouble concentrating -  Moving slowly or fidgety/restless -  Suicidal thoughts -  PHQ-9 Score -    ADLs:  In your present state of health, do  you have any difficulty performing the following activities: 02/05/2017 11/27/2016  Hearing? N N  Comment - -  Vision? N N  Difficulty concentrating or making decisions? N N  Walking or climbing stairs? N N  Dressing or bathing? N N  Doing errands, shopping? N N  Some recent data might be hidden     Cognitive Testing  Alert? Yes  Normal Appearance?Yes  Oriented to person? Yes  Place? Yes   Time? Yes  Recall of three objects?  Yes  Can perform simple calculations? Yes  Displays appropriate judgment?Yes  Can read the correct time from a watch face?Yes  EOL planning: Does Patient Have a Medical Advance Directive?: Yes Type of Advance Directive: Healthcare Power of [place], Living will  Copy of [place] in Chart?: No - copy requested    Review of Systems:  Review of Systems  Constitutional: Negative for chills, diaphoresis, fever, malaise/fatigue and weight loss.  HENT: Negative for congestion, ear discharge, ear pain, hearing loss, nosebleeds, sore throat and tinnitus.   Eyes: Negative.   Respiratory: Negative.  Negative for stridor.   Cardiovascular: Negative.   Gastrointestinal: Negative.   Genitourinary: Negative.   Musculoskeletal: Negative for back pain, falls, joint pain, myalgias and neck pain.  Skin: Negative.   Neurological: Positive for headaches. Negative for dizziness, tingling, tremors, sensory change, speech change, focal weakness, seizures, loss of consciousness and weakness.  Psychiatric/Behavioral: Negative.     Physical Exam: BP 124/70   Pulse 65   Temp (!) 97.5 F (36.4 C)   Resp 14   Ht 5\' 3"  (1.6 m)   Wt 108 lb 6.4 oz (49.2 kg)   SpO2 96%   BMI 19.20 kg/m  Wt Readings from Last 3 Encounters:  02/05/17 108 lb 6.4 oz (49.2 kg)  12/28/16 109 lb (49.4 kg)  11/27/16 109 lb 9.6 oz (49.7 kg)   General Appearance: Thin,frail, in no apparent distress. Eyes: PERRLA, EOMs, conjunctiva no swelling or erythema Sinuses: No  Frontal/maxillary tenderness ENT/Mouth: Ext aud canals clear, TMs without erythema, bulging. No erythema, swelling, or exudate on post pharynx.  Tonsils not swollen or erythematous. Hearing normal. + tenderness right temple/head Neck: Supple, thyroid normal.  Respiratory: Respiratory effort normal, BS equal bilaterally without rales, rhonchi, wheezing or stridor.  Cardio: RRR with no MRGs. Brisk peripheral pulses without edema.  Abdomen: Soft, + BS.  Non tender, no guarding, rebound, hernias, masses. Lymphatics: Non tender without lymphadenopathy.  Musculoskeletal: Full ROM, 5/5 strength, normal gait.  Skin: Warm, dry without rashes, lesions, ecchymosis.  Neuro: Cranial nerves intact. Normal muscle tone, no cerebellar symptoms. Sensation intact.  Psych: Awake and oriented X 3, normal affect, Insight and Judgment appropriate.   Medicare Attestation I have personally reviewed: The patient's medical and social history Their use of alcohol, tobacco or illicit drugs Their current medications and supplements The patient's functional ability including ADLs,fall risks, home safety risks, cognitive, and hearing and visual impairment Diet and physical activities Evidence for depression or mood disorders  The patient's weight, height, BMI, and visual acuity have been recorded in the chart.  I have made referrals, counseling, and provided education to the patient based on review of the above and I have provided the patient with a written personalized care plan for preventive services.  Over 40 minutes of exam, counseling, chart review was performed.  Vicie Mutters, PA-C 12:43 PM [facility]

## 2017-02-05 ENCOUNTER — Ambulatory Visit (INDEPENDENT_AMBULATORY_CARE_PROVIDER_SITE_OTHER): Payer: PPO | Admitting: Physician Assistant

## 2017-02-05 ENCOUNTER — Encounter: Payer: Self-pay | Admitting: Physician Assistant

## 2017-02-05 VITALS — BP 124/70 | HR 65 | Temp 97.5°F | Resp 14 | Ht 63.0 in | Wt 108.4 lb

## 2017-02-05 DIAGNOSIS — I771 Stricture of artery: Secondary | ICD-10-CM | POA: Diagnosis not present

## 2017-02-05 DIAGNOSIS — K219 Gastro-esophageal reflux disease without esophagitis: Secondary | ICD-10-CM

## 2017-02-05 DIAGNOSIS — R6889 Other general symptoms and signs: Secondary | ICD-10-CM

## 2017-02-05 DIAGNOSIS — E782 Mixed hyperlipidemia: Secondary | ICD-10-CM

## 2017-02-05 DIAGNOSIS — M810 Age-related osteoporosis without current pathological fracture: Secondary | ICD-10-CM | POA: Diagnosis not present

## 2017-02-05 DIAGNOSIS — G44329 Chronic post-traumatic headache, not intractable: Secondary | ICD-10-CM | POA: Diagnosis not present

## 2017-02-05 DIAGNOSIS — I10 Essential (primary) hypertension: Secondary | ICD-10-CM

## 2017-02-05 DIAGNOSIS — J439 Emphysema, unspecified: Secondary | ICD-10-CM | POA: Diagnosis not present

## 2017-02-05 DIAGNOSIS — Z0001 Encounter for general adult medical examination with abnormal findings: Secondary | ICD-10-CM | POA: Diagnosis not present

## 2017-02-05 DIAGNOSIS — E441 Mild protein-calorie malnutrition: Secondary | ICD-10-CM

## 2017-02-05 DIAGNOSIS — Z79899 Other long term (current) drug therapy: Secondary | ICD-10-CM

## 2017-02-05 DIAGNOSIS — Z7189 Other specified counseling: Secondary | ICD-10-CM | POA: Diagnosis not present

## 2017-02-05 DIAGNOSIS — E559 Vitamin D deficiency, unspecified: Secondary | ICD-10-CM | POA: Diagnosis not present

## 2017-02-05 DIAGNOSIS — R7303 Prediabetes: Secondary | ICD-10-CM | POA: Diagnosis not present

## 2017-02-05 DIAGNOSIS — S02402S Zygomatic fracture, unspecified, sequela: Secondary | ICD-10-CM

## 2017-02-05 DIAGNOSIS — G501 Atypical facial pain: Secondary | ICD-10-CM | POA: Diagnosis not present

## 2017-02-05 DIAGNOSIS — Z0000 Encounter for general adult medical examination without abnormal findings: Secondary | ICD-10-CM

## 2017-02-05 LAB — CBC WITH DIFFERENTIAL/PLATELET
Basophils Absolute: 0 cells/uL (ref 0–200)
Basophils Relative: 0 %
EOS PCT: 4 %
Eosinophils Absolute: 520 cells/uL — ABNORMAL HIGH (ref 15–500)
HCT: 39.9 % (ref 35.0–45.0)
Hemoglobin: 13 g/dL (ref 11.7–15.5)
LYMPHS PCT: 15 %
Lymphs Abs: 1950 cells/uL (ref 850–3900)
MCH: 30.6 pg (ref 27.0–33.0)
MCHC: 32.6 g/dL (ref 32.0–36.0)
MCV: 93.9 fL (ref 80.0–100.0)
MPV: 9.5 fL (ref 7.5–12.5)
Monocytes Absolute: 1560 cells/uL — ABNORMAL HIGH (ref 200–950)
Monocytes Relative: 12 %
Neutro Abs: 8970 cells/uL — ABNORMAL HIGH (ref 1500–7800)
Neutrophils Relative %: 69 %
Platelets: 329 10*3/uL (ref 140–400)
RBC: 4.25 MIL/uL (ref 3.80–5.10)
RDW: 13.3 % (ref 11.0–15.0)
WBC: 13 10*3/uL — AB (ref 3.8–10.8)

## 2017-02-05 LAB — TSH: TSH: 0.58 mIU/L

## 2017-02-05 MED ORDER — TRAMADOL HCL 50 MG PO TABS: ORAL_TABLET | ORAL | 1 refills | Status: DC

## 2017-02-05 NOTE — Patient Instructions (Addendum)
Chronic microvascular ischemic changes in the brain are often picked up incidentally on a scan of the brain, most typically an MRI. This is also referred to as periventricular white matter changes. These are typically small areas in the deeper white matter of the brain were tiny blood vessels or capillaries have been affected by atherosclerosis, resulting in leakage, rupture or tiny clots. These changes are typically not associated with stroke symptoms or TIA symptoms and can be found in completely asymptomatic but typically elderly adults. Most commonly, chronic microvascular ischemic changes are associated with chronic health problems, especially high blood pressure, high cholesterol, and diabetes. Chronic migraines can cause these changes as well.   A very high percentage of older adults with long standing problems with these conditions will have these findings on a brain MRI scan. It is hard to know exactly what the significance of the findings are. Generally speaking, chronic microvascular changes can build up over time and, when moderate to severe, have been linked to cognitive and other neurological deficits. To prevent cumulative buildup of microvascular ischemic disease we promote good blood pressure, cholesterol and diabetes management as well as a healthy lifestyle in general, i.e. eating right and exercising regularly.    Trigeminal Neuralgia Trigeminal neuralgia is a nerve disorder that causes attacks of severe facial pain. The attacks last from a few seconds to several minutes. They can happen for days, weeks, or months and then go away for months or years. Trigeminal neuralgia is also called tic douloureux. What are the causes? This condition is caused by damage to a nerve in the face that is called the trigeminal nerve. An attack can be triggered by:  Talking.  Chewing.  Putting on makeup.  Washing your face.  Shaving your face.  Brushing your teeth.  Touching your face.  What  increases the risk? This condition is more likely to develop in:  Women.  People who are 81 years of age or older.  What are the signs or symptoms? The main symptom of this condition is pain in the jaw, lips, eyes, nose, scalp, forehead, and face. The pain may be intense, stabbing, electric, or shock-like. How is this diagnosed? This condition is diagnosed with a physical exam. A CT scan or MRI may be done to rule out other conditions that can cause facial pain. How is this treated? This condition may be treated with:  Avoiding the things that trigger your attacks.  Pain medicine.  Surgery. This may be done in severe cases if other medical treatment does not provide relief.  Follow these instructions at home:  Take over-the-counter and prescription medicines only as told by your health care provider.  If you wish to get pregnant, talk with your health care provider before you start trying to get pregnant.  Avoid the things that trigger your attacks. It may help to: ? Chew on the unaffected side of your mouth. ? Avoid touching your face. ? Avoid blasts of hot or cold air. Contact a health care provider if:  Your pain medicine is not helping.  You develop new, unexplained symptoms, such as: ? Double vision. ? Facial weakness. ? Changes in hearing or balance.  You become pregnant. Get help right away if:  Your pain is unbearable, and your pain medicine does not help. This information is not intended to replace advice given to you by your health care provider. Make sure you discuss any questions you have with your health care provider. Document Released: 06/08/2000 Document Revised:  02/12/2016 Document Reviewed: 10/04/2014 Elsevier Interactive Patient Education  2018 [place] CHECK TO SEE IF ON PROTONIX AT HOME, THIS IS MORE POWERFUL THAN ZANTAC CAN TRY THAT FOR A WHILE IF WE NEED TO  10 Tips on Belching, Bloating, and Flatulence 1. Belching is caused  by swallowed air from:  Eating or drinking too fast  Poorly fitting dentures; not chewing food completely  Carbonated beverages  Chewing gum or sucking on hard candies  Excessive swallowing due to nervous tension or postnasal drip  Forced belching to relieve abdominal discomfort 2. To prevent excessive belching, avoid:  Carbonated beverages  Chewing gum  Hard candies  Simethicone/GasX may be helpful  3. Abdominal bloating and discomfort may be due to intestinal sensitivity or symptoms of irritable bowel syndrome. To relieve symptoms, avoid:  Broccoli  Baked beans  Cabbage  Carbonated drinks  Cauliflower  Chewing gum  Hard candy 4. Abdominal distention resulting from weak abdominal muscles:  Is better in the morning  Gets worse as the day progresses  Is relieved by lying down 5. To prevent Abdominal distention:  Tighten abdominal muscles by pulling in your stomach several times during the day  Do sit-up exercises if possible  Wear an abdominal support garment if exercise is too difficult 6. Flatulence is gas created through bacterial action in the bowel and passed rectally. Keep in mind that:  10-18 passages per day are normal  Primary gases are harmless and odorless  Noticeable smells are trace gases related to food intake 7. Foods that are likely to form gas include:  Milk, dairy products, and medications that contain lactose--If your body doesn't produce the enzyme (lactase) to break it down.  Certain vegetables--baked beans, cauliflower, broccoli, cabbage  Certain starches--wheat, oats, corn, potatoes. Rice is a good substitute. 8. Identify offending foods. Reduce or eliminate these gas-forming foods from your diet.

## 2017-02-05 NOTE — Progress Notes (Signed)
TRAMADOL CALLED INTO PHARMACY ON 14TH AUG 2018 BY DD

## 2017-02-06 ENCOUNTER — Telehealth (INDEPENDENT_AMBULATORY_CARE_PROVIDER_SITE_OTHER): Payer: Self-pay

## 2017-02-06 LAB — BASIC METABOLIC PANEL WITH GFR
BUN: 13 mg/dL (ref 7–25)
CALCIUM: 9.2 mg/dL (ref 8.6–10.4)
CO2: 25 mmol/L (ref 20–32)
Chloride: 94 mmol/L — ABNORMAL LOW (ref 98–110)
Creat: 0.74 mg/dL (ref 0.60–0.88)
GFR, EST AFRICAN AMERICAN: 88 mL/min (ref >=60)
GFR, EST NON AFRICAN AMERICAN: 76 mL/min (ref >=60)
GLUCOSE: 83 mg/dL (ref 65–99)
Potassium: 4.3 mmol/L (ref 3.5–5.3)
Sodium: 132 mmol/L — ABNORMAL LOW (ref 135–146)

## 2017-02-06 LAB — LIPID PANEL
CHOLESTEROL: 193 mg/dL (ref <200)
HDL: 85 mg/dL (ref >50)
LDL Cholesterol: 77 mg/dL (ref <100)
TRIGLYCERIDES: 153 mg/dL — AB (ref <150)
Total CHOL/HDL Ratio: 2.3 Ratio (ref <5.0)
VLDL: 31 mg/dL — AB (ref <30)

## 2017-02-06 LAB — HEPATIC FUNCTION PANEL
ALBUMIN: 4.4 g/dL (ref 3.6–5.1)
ALT: 11 U/L (ref 6–29)
AST: 16 U/L (ref 10–35)
Alkaline Phosphatase: 69 U/L (ref 33–130)
BILIRUBIN INDIRECT: 0.5 mg/dL (ref 0.2–1.2)
Bilirubin, Direct: 0.1 mg/dL (ref <=0.2)
TOTAL PROTEIN: 7.3 g/dL (ref 6.1–8.1)
Total Bilirubin: 0.6 mg/dL (ref 0.2–1.2)

## 2017-02-06 LAB — MAGNESIUM: Magnesium: 1.9 mg/dL (ref 1.5–2.5)

## 2017-02-06 NOTE — Telephone Encounter (Signed)
Pt left vm wanting to set up left Si joint injection. States rt side is doing well since last injection.

## 2017-02-07 NOTE — Telephone Encounter (Signed)
Pt scheduled for 03/11/17 per her request

## 2017-02-07 NOTE — Telephone Encounter (Signed)
We did GT bursa on right so ok to inject left but might be GT Bursa or SI joint.Marland KitchenMarland Kitchen

## 2017-02-13 ENCOUNTER — Other Ambulatory Visit: Payer: Self-pay | Admitting: Physician Assistant

## 2017-03-09 ENCOUNTER — Other Ambulatory Visit: Payer: Self-pay | Admitting: Internal Medicine

## 2017-03-09 ENCOUNTER — Other Ambulatory Visit: Payer: Self-pay | Admitting: Physician Assistant

## 2017-03-11 ENCOUNTER — Ambulatory Visit (INDEPENDENT_AMBULATORY_CARE_PROVIDER_SITE_OTHER): Payer: PPO | Admitting: Physical Medicine and Rehabilitation

## 2017-03-12 ENCOUNTER — Other Ambulatory Visit: Payer: Self-pay | Admitting: Physician Assistant

## 2017-03-13 NOTE — Telephone Encounter (Signed)
Please call Clonazepam

## 2017-03-27 ENCOUNTER — Encounter: Payer: Self-pay | Admitting: Physician Assistant

## 2017-03-27 ENCOUNTER — Ambulatory Visit (INDEPENDENT_AMBULATORY_CARE_PROVIDER_SITE_OTHER): Payer: PPO | Admitting: Adult Health

## 2017-03-27 VITALS — BP 110/64 | HR 55 | Temp 97.5°F | Resp 14 | Ht 63.0 in | Wt 109.0 lb

## 2017-03-27 DIAGNOSIS — L03116 Cellulitis of left lower limb: Secondary | ICD-10-CM

## 2017-03-27 MED ORDER — CEPHALEXIN 500 MG PO CAPS: 500.0000 mg | ORAL_CAPSULE | Freq: Three times a day (TID) | ORAL | 0 refills | Status: DC

## 2017-03-27 NOTE — Progress Notes (Signed)
Assessment and Plan:  Debra White was seen today for acute visit.  Diagnoses and all orders for this visit:  Cellulitis of left lower extremity -     cephALEXin (KEFLEX) 500 MG capsule; Take 1 capsule (500 mg total) by mouth 3 (three) times daily.  Patient given information on caring for the wound, dressings, and instructions to monitor appearance of wound, and call the office back in 2 days (friday morning) should the borders continue to spread despite antibiotic.  Discussed med's effects and SE's.   Over 15 minutes of exam, counseling, chart review, and critical decision making was performed.   Future Appointments Date Time Provider [place]  03/27/2017 11:15 AM Liane Comber, NP [facility] None  04/03/2017 1:00 PM Magnus Sinning, MD [facility] None  05/09/2017 9:30 AM Unk Pinto, MD [facility] None  11/14/2017 9:00 AM Unk Pinto, MD [facility] None    ------------------------------------------------------------------------------------------------------------------   HPI BP 110/64   Pulse (!) 55   Temp (!) 97.5 F (36.4 C)   Resp 14   Ht 5\' 3"  (1.6 m)   Wt 109 lb (49.4 kg)   SpO2 99%   BMI 19.31 kg/m   83 y.o.female presents for a concerning wound on her Left shin- she reports she sustained this approximately a week ago when she walked into a coffee table. She presents with a superficial open wound to her mid shin- 0.5-1cm wide, and approximately 8 cm long; open wound bed with yellow/purulent discharge, surrounding area (approx 0.5 cm border around open wound) is injected/erythematous. She reports she is now experiencing some mild local pain with ambulation. She has been irrigating the wound regularly, and is covering with a adhesive bandage. Denies fever, chills, HA, CP, SOB, N/V, fatigue/malaise, confusion, referred pain to joints, inability to bear weight.    Past Medical History:  Diagnosis Date  . Anxiety   . Arthritis   . Blood transfusion   . Candida  esophagitis ([place]) 2013   EGD  . Cataract   . Chronic kidney disease    STONES  . Diverticulosis of colon (without mention of hemorrhage) 2007   Colonoscopy  . Dysrhythmia    RX  . Emphysema of lung ([place])   . Family history of colon cancer    sister  . Fibromyalgia   . GERD (gastroesophageal reflux disease)   . Headache(784.0)   . Hyperlipemia   . Hypertension   . Kidney stones   . Ptosis, bilateral      Allergies  Allergen Reactions  . Latex Hives    TONGUE HAD BLISTERS WHEN HAD DENTAL SURGERY  . Amoxicillin Nausea And Vomiting  . Aspirin     Gi upset  . Barbiturates   . Celebrex [Celecoxib]   . Codeine Itching  . Evista [Raloxifene]   . Lipitor [Atorvastatin]   . Morphine And Related   . Oruvail [Ketoprofen]   . Pantoprazole Diarrhea  . Paraffin   . Prochlorperazine Other (See Comments)    Uncontrolled shaking  . Proloprim [Trimethoprim]   . Venlafaxine Nausea Only  . Vibra-Tab [Doxycycline]   . Vioxx [Rofecoxib]     edema  . Betadine [Povidone Iodine] Rash  . Caffeine Palpitations    Current Outpatient Prescriptions on File Prior to Visit  Medication Sig  . acetaminophen (TYLENOL) 500 MG tablet Take 500-1,000 mg by mouth every 6 (six) hours as needed for headache.  Marland Kitchen atenolol (TENORMIN) 100 MG tablet TAKE 1 TABLET BY MOUTH EVERY MORNING  . cholecalciferol (VITAMIN D) 1000 UNITS  tablet Take 4,000 Units by mouth daily.   . clonazePAM (KLONOPIN) 1 MG tablet TAKE 1/2 TO 1 TABLET BY MOUTH UP TO THREE TIMES DAILY AS NEEDED FOR NERVES  . gabapentin (NEURONTIN) 300 MG capsule TAKE 1 CAPSULE BY MOUTH 3-4 TIMES DAILY FOR FACIAL PAIN  . hyoscyamine (LEVSIN SL) 0.125 MG SL tablet Dissolve one tablet by mouth every 4-6 hours as needed (Patient taking differently: Take 0.125 mg by mouth every 4 (four) hours as needed for cramping. Dissolve one tablet by mouth every 4-6 hours as needed)  . losartan (COZAAR) 100 MG tablet TAKE 1/2 TABLET BY MOUTH DAILY (Patient taking  differently: TAKE 1/2 TABLET (50mg ) BY MOUTH IN THE EVENING)  . pravastatin (PRAVACHOL) 40 MG tablet TAKE 1 TABLET BY MOUTH DAILY  . ranitidine (ZANTAC) 300 MG tablet TAKE 1 TABLET BY MOUTH TWICE DAILY AS NEEDED FOR ACID INDIGESTION ANDREFLUX  . traMADol (ULTRAM) 50 MG tablet TAKE 1 TABLET BY MOUTH 4 TIMES DAILY AS NEEDED FOR PAIN  . pantoprazole (PROTONIX) 20 MG tablet Take 1 tablet (20 mg total) by mouth daily.  . [DISCONTINUED] omeprazole (PRILOSEC OTC) 20 MG tablet Take 20 mg by mouth daily.   No current facility-administered medications on file prior to visit.     ROS: all negative except above.   Physical Exam:  BP 110/64   Pulse (!) 55   Temp (!) 97.5 F (36.4 C)   Resp 14   Ht 5\' 3"  (1.6 m)   Wt 109 lb (49.4 kg)   SpO2 99%   BMI 19.31 kg/m   General Appearance: Well nourished, in no apparent distress. Eyes: PERRLA, conjunctiva no swelling or erythema ENT/Mouth: Hearing normal.  Neck: Supple.  Respiratory: Respiratory effort normal, BS equal bilaterally without rales, rhonchi, wheezing or stridor.  Cardio: RRR with no MRGs. Brisk peripheral pulses without edema.  Abdomen: Soft, + BS.  Non tender, no guarding, rebound, hernias, masses. Lymphatics: Non tender without lymphadenopathy.  Musculoskeletal: Full ROM, 5/5 strength, normal gait. No point tenderness along tibia, mild pain at wound site with active ROM of ankle.  Skin: Warm, dry, without lesions/ecchymosis. Intact other than wound in question on anterior lower leg- superficial open wound to her mid shin- 0.5-1cm wide, and approximately 8 cm long; open wound bed with yellow/purulent discharge, surrounding area (approx 0.5 cm border around open wound) is injected/erythematous. Neuro: Cranial nerves intact. Normal muscle tone, no cerebellar symptoms. Sensation intact.  Psych: Awake and oriented X 3, normal affect, Insight and Judgment appropriate.     Debra Ribas, NP 11:04 AM [facility]

## 2017-03-27 NOTE — Patient Instructions (Signed)
Take keflex 500 mg 1 tablet three times a day with food and plenty of water. Keep the area on your shin clean, may pour hydrogenperoxide on it to clean, keep covered at night or if you are going out.   Call me back Liane Comber, nurse practitioner) on Friday morning if the red area is spreading. We may change your antibiotic and order an xray.    Cellulitis, Adult Cellulitis is a skin infection. The infected area is usually red and tender. This condition occurs most often in the arms and lower legs. The infection can travel to the muscles, blood, and underlying tissue and become serious. It is very important to get treated for this condition. What are the causes? Cellulitis is caused by bacteria. The bacteria enter through a break in the skin, such as a cut, burn, insect bite, open sore, or crack. What increases the risk? This condition is more likely to occur in people who:  Have a weak defense system (immune system).  Have open wounds on the skin such as cuts, burns, bites, and scrapes. Bacteria can enter the body through these open wounds.  Are older.  Have diabetes.  Have a type of long-lasting (chronic) liver disease (cirrhosis) or kidney disease.  Use IV drugs.  What are the signs or symptoms? Symptoms of this condition include:  Redness, streaking, or spotting on the skin.  Swollen area of the skin.  Tenderness or pain when an area of the skin is touched.  Warm skin.  Fever.  Chills.  Blisters.  How is this diagnosed? This condition is diagnosed based on a medical history and physical exam. You may also have tests, including:  Blood tests.  Lab tests.  Imaging tests.  How is this treated? Treatment for this condition may include:  Medicines, such as antibiotic medicines or antihistamines.  Supportive care, such as rest and application of cold or warm cloths (cold or warm compresses) to the skin.  Hospital care, if the condition is severe.  The  infection usually gets better within 1-2 days of treatment. Follow these instructions at home:  Take over-the-counter and prescription medicines only as told by your health care provider.  If you were prescribed an antibiotic medicine, take it as told by your health care provider. Do not stop taking the antibiotic even if you start to feel better.  Drink enough fluid to keep your urine clear or pale yellow.  Do not touch or rub the infected area.  Raise (elevate) the infected area above the level of your heart while you are sitting or lying down.  Apply warm or cold compresses to the affected area as told by your health care provider.  Keep all follow-up visits as told by your health care provider. This is important. These visits let your health care provider make sure a more serious infection is not developing. Contact a health care provider if:  You have a fever.  Your symptoms do not improve within 1-2 days of starting treatment.  Your bone or joint underneath the infected area becomes painful after the skin has healed.  Your infection returns in the same area or another area.  You notice a swollen bump in the infected area.  You develop new symptoms.  You have a general ill feeling (malaise) with muscle aches and pains. Get help right away if:  Your symptoms get worse.  You feel very sleepy.  You develop vomiting or diarrhea that persists.  You notice red streaks coming from  the infected area.  Your red area gets larger or turns dark in color. This information is not intended to replace advice given to you by your health care provider. Make sure you discuss any questions you have with your health care provider. Document Released: 03/21/2005 Document Revised: 10/20/2015 Document Reviewed: 04/20/2015 Elsevier Interactive Patient Education  2017 [employer].

## 2017-03-29 ENCOUNTER — Ambulatory Visit (HOSPITAL_COMMUNITY)
Admission: RE | Admit: 2017-03-29 | Discharge: 2017-03-29 | Disposition: A | Discharge status: Home / Self Care | Payer: PPO | Source: Ambulatory Visit | Attending: Adult Health | Admitting: Adult Health

## 2017-03-29 ENCOUNTER — Telehealth: Payer: Self-pay

## 2017-03-29 ENCOUNTER — Other Ambulatory Visit: Payer: Self-pay | Admitting: Adult Health

## 2017-03-29 DIAGNOSIS — S80812A Abrasion, left lower leg, initial encounter: Secondary | ICD-10-CM | POA: Diagnosis not present

## 2017-03-29 DIAGNOSIS — M79605 Pain in left leg: Secondary | ICD-10-CM

## 2017-03-29 NOTE — Telephone Encounter (Signed)
Pt reports that it doesn't look like its infected, has started to scab over per pt. She reports it's still a little red but looks a lot better. Pt reports she will go get her Xray done today & will wait for a report back from [place].

## 2017-03-29 NOTE — Telephone Encounter (Signed)
-----   Message from Liane Comber, NP sent at 03/29/2017  9:16 AM EDT ----- Regarding: FW: leg issues I will put in for an xray- [facility]. Can you also ask her how the infection is looking? Has it spread or getting better? She was supposed to follow up on this today. Thanks   ----- Message ----- From: [name], CMA Sent: 03/29/2017   8:49 AM To: Liane Comber, NP Subject: leg issues                                     Pt called to report that she still is having some leg pain & would like to get an U/S if that is what you want her to get. Please advise

## 2017-03-29 NOTE — Progress Notes (Signed)
Patient called reporting pain is worse post mid trauma to shin 1 week ago; she has a wound being treated for cellulitis. Ordering XR.

## 2017-04-01 NOTE — Progress Notes (Signed)
LVM for pt to return office call for LAB results.

## 2017-04-01 NOTE — Progress Notes (Signed)
Pt aware of lab results & voiced understanding of those results.

## 2017-04-03 ENCOUNTER — Ambulatory Visit (INDEPENDENT_AMBULATORY_CARE_PROVIDER_SITE_OTHER): Payer: PPO | Admitting: Physical Medicine and Rehabilitation

## 2017-04-03 ENCOUNTER — Ambulatory Visit (INDEPENDENT_AMBULATORY_CARE_PROVIDER_SITE_OTHER): Payer: PPO

## 2017-04-03 ENCOUNTER — Encounter (INDEPENDENT_AMBULATORY_CARE_PROVIDER_SITE_OTHER): Payer: Self-pay | Admitting: Physical Medicine and Rehabilitation

## 2017-04-03 DIAGNOSIS — S81812S Laceration without foreign body, left lower leg, sequela: Secondary | ICD-10-CM

## 2017-04-03 DIAGNOSIS — M461 Sacroiliitis, not elsewhere classified: Secondary | ICD-10-CM | POA: Diagnosis not present

## 2017-04-03 MED ORDER — BUPIVACAINE HCL 0.5 % IJ SOLN
2.0000 mL | INTRAMUSCULAR | Status: AC | PRN
  Administered 2017-04-03: 2 mL via INTRA_ARTICULAR

## 2017-04-03 MED ORDER — METHYLPREDNISOLONE ACETATE 80 MG/ML IJ SUSP
80.0000 mg | INTRAMUSCULAR | Status: AC | PRN
  Administered 2017-04-03: 80 mg via INTRA_ARTICULAR

## 2017-04-03 NOTE — Progress Notes (Signed)
Debra White - 83 y.o. female MRN [id number]  Date of birth: [birth year]  Office Visit Note: Visit Date: 04/03/2017 PCP: Unk Pinto, MD Referred by: Unk Pinto, MD  Subjective: Chief Complaint  Patient presents with  . Lower Back - Pain  . Left Hip - Pain   HPI: Debra White is an 83 year old female with chronic muscle skeletal complaints and chronic sacroiliac joint and bursitis. We saw her in July and completed a right trochanter injection with good relief. She is complaining today of left buttock pain with referral to the posterior lateral hip and thigh consistent with SI joint pathology and sacroiliitis which is had the past.  She also reports recent fall for the left lower extremity. She has a healing scar on the anterior tibia. There is some mild redness and irritation around the scab and eschar. There is no drainage. There is no tenderness. She did have this looked at it dressed by another physician that they wanted her to have this looked at by [place]. It is well healing.    Review of Systems  Constitutional: Negative for chills, fever, malaise/fatigue and weight loss.  HENT: Negative for hearing loss and sinus pain.   Eyes: Negative for blurred vision, double vision and photophobia.  Respiratory: Negative for cough and shortness of breath.   Cardiovascular: Negative for chest pain, palpitations and leg swelling.  Gastrointestinal: Negative for abdominal pain, nausea and vomiting.  Genitourinary: Negative for flank pain.  Musculoskeletal: Positive for back pain, falls and joint pain. Negative for myalgias.  Skin: Negative for itching and rash.  Neurological: Negative for tremors, focal weakness and weakness.  Endo/Heme/Allergies: Negative.   Psychiatric/Behavioral: Negative for depression.  All other systems reviewed and are negative.  Otherwise per HPI.  Assessment & Plan: Visit Diagnoses:  1. Sacroiliitis ([place])   2. Laceration of left lower extremity excluding  thigh, sequela     Plan: Findings:  Left sacroiliac joint injection. Guidance.    Meds & Orders: No orders of the defined types were placed in this encounter.   Orders Placed This Encounter  Procedures  . Large Joint Injection/Arthrocentesis  . XR C-ARM NO REPORT    Follow-up: Return if symptoms worsen or fail to improve.   Procedures: Large Joint Inj Date/Time: 04/03/2017 1:16 PM Performed by: Magnus Sinning Authorized by: Magnus Sinning   Consent Given by:  Patient Site marked: the procedure site was marked   Timeout: prior to procedure the correct patient, procedure, and site was verified   Indications:  Pain and diagnostic evaluation Location:  Sacroiliac Site:  L sacroiliac joint Prep: patient was prepped and draped in usual sterile fashion   Needle Size:  22 G Needle Length:  3.5 inches Approach:  Posterior Ultrasound Guidance: No   Fluoroscopic Guidance: Yes   Arthrogram: No   Medications:  80 mg methylPREDNISolone acetate 80 MG/ML; 2 mL bupivacaine 0.5 % Aspiration Attempted: No   Patient tolerance:  Patient tolerated the procedure well with no immediate complications  There was excellent flow of contrast producing a partial arthrogram of the sacroiliac joint.     No notes on file   Clinical History: MRI LUMBAR SPINE WITHOUT CONTRAST  TECHNIQUE: Multiplanar, multisequence MR imaging of the lumbar spine was performed. No intravenous contrast was administered.  COMPARISON:  CT scan dated 04/13/2015 and MRIs dated 08/27/2014 and 08/15/2012  FINDINGS: Normal conus tip at T12-L1. No significant abnormality of the paraspinal soft tissues.  T12-L1: Slight disc degeneration with a tiny broad-based  disc bulge with no neural impingement, unchanged.  L1-2: Chronic unchanged deformity of the superior endplate of L2. The disc is otherwise essentially normal. No change.  L2-3: Tiny disc bulge into the right neural foramen with no neural impingement,  unchanged. Minimal degenerative changes of the facet joints.  L3-4 through L5-S1: Solid fusion posteriorly. The L3-4 disc is normal. Solid fusion of the L4 and L5 vertebra to 1 another. Chronic stable spondylolisthesis deformity at L4-5.  The L5-S1 disc is normal. The neural foramina are widely patent throughout the lumbar spine.  IMPRESSION: No spinal or foraminal stenosis or disc protrusions or neural impingement. Stable solid fusion from L3-S1.  No change since the prior exam.   Electronically Signed   By: Lorriane Shire M.D.   On: 10/28/2015 09:46  She reports that she has never smoked. She has never used smokeless tobacco.   Recent Labs  04/05/16 1247 10/29/16 0946  HGBA1C 5.4 5.2    Objective:  VS:  HT:    WT:   BMI:     BP:   HR: bpm  TEMP: ( )  RESP:  Physical Exam  Constitutional: She is oriented to person, place, and time. She appears well-developed and well-nourished.  Eyes: Pupils are equal, round, and reactive to light. Conjunctivae and EOM are normal.  Cardiovascular: Normal rate and intact distal pulses.   Pulmonary/Chest: Effort normal.  Musculoskeletal:  Positive for trigger sign on the left. She does have pain with external rotation of the left hip. Minimal tenderness over the greater trochanter. She has improved tenderness over the right greater trochanter. No pain with hip rotation from internal standpoint. Good distal strength.  Neurological: She is alert and oriented to person, place, and time. She exhibits normal muscle tone.  Skin: Skin is warm and dry. No rash noted. No erythema.  Left healing scar on the anterior tibial area. No drainage or swelling or erythema.  Psychiatric: She has a normal mood and affect. Her behavior is normal.  Nursing note and vitals reviewed.   Ortho Exam Imaging: No results found.  Past Medical/Family/Surgical/Social History: Medications & Allergies reviewed per EMR Patient Active Problem List    Diagnosis Date Noted  . COPD (chronic obstructive pulmonary disease) with emphysema ([place]) 12/21/2015  . 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 Osteoporosis 12/21/2015  . Mild malnutrition ([place]) 05/11/2015  . Encounter for Medicare annual wellness exam 04/08/2015  . Prediabetes 01/27/2014  . Medication management 01/27/2014  . 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 Vitamin D deficiency 07/08/2013  . Hypertension   . Hyperlipidemia   . GERD   . Headache, post-traumatic, chronic 09/20/2011  . Fracture, zygoma closed ([place]) 07/13/2011   Past Medical History:  Diagnosis Date  . Anxiety   . Arthritis   . Blood transfusion   . Candida esophagitis ([place]) 2013   EGD  . Cataract   . Chronic kidney disease    STONES  . Diverticulosis of colon (without mention of hemorrhage) 2007   Colonoscopy  . Dysrhythmia    RX  . Emphysema of lung ([place])   . Family history of colon cancer    sister  . Fibromyalgia   . GERD (gastroesophageal reflux disease)   . Headache(784.0)   . Hyperlipemia   . Hypertension   . Kidney stones   . Ptosis, bilateral    Family History  Problem Relation Age of Onset  . Heart disease Mother   . Heart disease Father   . Breast cancer Sister   .  Colon cancer Sister   . Cancer Sister        breast  . Cancer Sister        colon  . Cancer Sister        colon  . Hypertension Daughter   . Hyperlipidemia Daughter    Past Surgical History:  Procedure Laterality Date  . ABDOMINAL HYSTERECTOMY    . BACK SURGERY     X2  . BACK SURGERY    . CERVICAL DISC SURGERY    . COLONOSCOPY    . FACIAL FRACTURE SURGERY    . HAND SURGERY     BIL   . ORIF TRIPOD FRACTURE  07/13/2011   Procedure: OPEN REDUCTION INTERNAL FIXATION (ORIF) TRIPOD FRACTURE;  Surgeon: Tyson Alias, MD;  Location: [facility];  Service: ENT;  Laterality: Right;  ORIF RIGHT ZYGOMA, ORBITAL FLOOR EXPLORATION WITH FROST STITCH (TEMPORARY TARSORRHAPHY)  . PTOSIS REPAIR Bilateral 11/22/2016   Procedure: INTERNAL  PTOSIS REPAIR;  Surgeon: Clista Bernhardt, MD;  Location: [facility];  Service: Ophthalmology;  Laterality: Bilateral;  . SHOULDER ARTHROSCOPY W/ ROTATOR CUFF REPAIR     LFT   Social History   Occupational History  . Retired Retired   Social History Main Topics  . Smoking status: Never Smoker  . Smokeless tobacco: Never Used  . Alcohol use No  . Drug use: No  . Sexual activity: Not on file     Comment: HYSTER

## 2017-04-03 NOTE — Patient Instructions (Signed)

## 2017-04-03 NOTE — Progress Notes (Signed)
Patient is having pain lateral left hip. Worse with lying down. Says it is mostly just sore.  Also has stomach pain today.

## 2017-04-05 ENCOUNTER — Emergency Department (HOSPITAL_COMMUNITY): Payer: PPO

## 2017-04-05 ENCOUNTER — Inpatient Hospital Stay (HOSPITAL_COMMUNITY)
Admission: EM | Admit: 2017-04-05 | Discharge: 2017-04-09 | DRG: 372 | Disposition: A | Discharge status: Home Health | Payer: PPO | Attending: Internal Medicine | Admitting: Internal Medicine

## 2017-04-05 ENCOUNTER — Encounter (HOSPITAL_COMMUNITY): Payer: Self-pay | Admitting: Emergency Medicine

## 2017-04-05 DIAGNOSIS — K5792 Diverticulitis of intestine, part unspecified, without perforation or abscess without bleeding: Secondary | ICD-10-CM | POA: Diagnosis not present

## 2017-04-05 DIAGNOSIS — R109 Unspecified abdominal pain: Secondary | ICD-10-CM | POA: Diagnosis not present

## 2017-04-05 DIAGNOSIS — M810 Age-related osteoporosis without current pathological fracture: Secondary | ICD-10-CM | POA: Diagnosis present

## 2017-04-05 DIAGNOSIS — Z885 Allergy status to narcotic agent status: Secondary | ICD-10-CM

## 2017-04-05 DIAGNOSIS — Z803 Family history of malignant neoplasm of breast: Secondary | ICD-10-CM | POA: Diagnosis not present

## 2017-04-05 DIAGNOSIS — R1032 Left lower quadrant pain: Secondary | ICD-10-CM | POA: Diagnosis not present

## 2017-04-05 DIAGNOSIS — Z91040 Latex allergy status: Secondary | ICD-10-CM

## 2017-04-05 DIAGNOSIS — E871 Hypo-osmolality and hyponatremia: Secondary | ICD-10-CM | POA: Diagnosis present

## 2017-04-05 DIAGNOSIS — E782 Mixed hyperlipidemia: Secondary | ICD-10-CM | POA: Diagnosis present

## 2017-04-05 DIAGNOSIS — I10 Essential (primary) hypertension: Secondary | ICD-10-CM | POA: Diagnosis not present

## 2017-04-05 DIAGNOSIS — A0472 Enterocolitis due to Clostridium difficile, not specified as recurrent: Secondary | ICD-10-CM | POA: Diagnosis not present

## 2017-04-05 DIAGNOSIS — M797 Fibromyalgia: Secondary | ICD-10-CM | POA: Diagnosis present

## 2017-04-05 DIAGNOSIS — E785 Hyperlipidemia, unspecified: Secondary | ICD-10-CM | POA: Diagnosis not present

## 2017-04-05 DIAGNOSIS — Z87442 Personal history of urinary calculi: Secondary | ICD-10-CM | POA: Diagnosis not present

## 2017-04-05 DIAGNOSIS — Z881 Allergy status to other antibiotic agents status: Secondary | ICD-10-CM

## 2017-04-05 DIAGNOSIS — K219 Gastro-esophageal reflux disease without esophagitis: Secondary | ICD-10-CM | POA: Diagnosis not present

## 2017-04-05 DIAGNOSIS — Z800 Family history of malignant neoplasm of digestive organs: Secondary | ICD-10-CM | POA: Diagnosis not present

## 2017-04-05 DIAGNOSIS — Z8249 Family history of ischemic heart disease and other diseases of the circulatory system: Secondary | ICD-10-CM | POA: Diagnosis not present

## 2017-04-05 DIAGNOSIS — Z886 Allergy status to analgesic agent status: Secondary | ICD-10-CM

## 2017-04-05 DIAGNOSIS — H9190 Unspecified hearing loss, unspecified ear: Secondary | ICD-10-CM | POA: Diagnosis present

## 2017-04-05 DIAGNOSIS — J439 Emphysema, unspecified: Secondary | ICD-10-CM | POA: Diagnosis present

## 2017-04-05 DIAGNOSIS — K5732 Diverticulitis of large intestine without perforation or abscess without bleeding: Secondary | ICD-10-CM | POA: Diagnosis not present

## 2017-04-05 DIAGNOSIS — D649 Anemia, unspecified: Secondary | ICD-10-CM | POA: Diagnosis not present

## 2017-04-05 DIAGNOSIS — K922 Gastrointestinal hemorrhage, unspecified: Secondary | ICD-10-CM | POA: Diagnosis not present

## 2017-04-05 DIAGNOSIS — R197 Diarrhea, unspecified: Secondary | ICD-10-CM | POA: Diagnosis not present

## 2017-04-05 DIAGNOSIS — Z66 Do not resuscitate: Secondary | ICD-10-CM | POA: Diagnosis present

## 2017-04-05 DIAGNOSIS — R9431 Abnormal electrocardiogram [ECG] [EKG]: Secondary | ICD-10-CM | POA: Diagnosis not present

## 2017-04-05 HISTORY — DX: Diverticulitis of intestine, part unspecified, without perforation or abscess without bleeding: K57.92

## 2017-04-05 LAB — CBC WITH DIFFERENTIAL/PLATELET
Basophils Absolute: 0 10*3/uL (ref 0.0–0.1)
Basophils Relative: 0 %
Eosinophils Absolute: 0 10*3/uL (ref 0.0–0.7)
Eosinophils Relative: 0 %
HEMATOCRIT: 37.9 % (ref 36.0–46.0)
Hemoglobin: 12.5 g/dL (ref 12.0–15.0)
LYMPHS ABS: 0.9 10*3/uL (ref 0.7–4.0)
LYMPHS PCT: 5 %
MCH: 30.5 pg (ref 26.0–34.0)
MCHC: 33 g/dL (ref 30.0–36.0)
MCV: 92.4 fL (ref 78.0–100.0)
MONO ABS: 2.5 10*3/uL — AB (ref 0.1–1.0)
MONOS PCT: 12 %
NEUTROS ABS: 17 10*3/uL — AB (ref 1.7–7.7)
Neutrophils Relative %: 83 %
Platelets: 275 10*3/uL (ref 150–400)
RBC: 4.1 MIL/uL (ref 3.87–5.11)
RDW: 13.1 % (ref 11.5–15.5)
WBC: 20.4 10*3/uL — ABNORMAL HIGH (ref 4.0–10.5)

## 2017-04-05 LAB — COMPREHENSIVE METABOLIC PANEL
ALBUMIN: 4 g/dL (ref 3.5–5.0)
ALT: 15 U/L (ref 14–54)
AST: 24 U/L (ref 15–41)
Alkaline Phosphatase: 79 U/L (ref 38–126)
Anion gap: 10 (ref 5–15)
BILIRUBIN TOTAL: 0.7 mg/dL (ref 0.3–1.2)
BUN: 9 mg/dL (ref 6–20)
CHLORIDE: 91 mmol/L — AB (ref 101–111)
CO2: 26 mmol/L (ref 22–32)
Calcium: 9.3 mg/dL (ref 8.9–10.3)
Creatinine, Ser: 0.77 mg/dL (ref 0.44–1.00)
GFR calc Af Amer: >60 mL/min (ref >60)
GFR calc non Af Amer: >60 mL/min (ref >60)
GLUCOSE: 113 mg/dL — AB (ref 65–99)
POTASSIUM: 4.9 mmol/L (ref 3.5–5.1)
Sodium: 127 mmol/L — ABNORMAL LOW (ref 135–145)
Total Protein: 7.5 g/dL (ref 6.5–8.1)

## 2017-04-05 LAB — LIPASE, BLOOD: Lipase: 26 U/L (ref 11–51)

## 2017-04-05 LAB — I-STAT CG4 LACTIC ACID, ED: Lactic Acid, Venous: 1.45 mmol/L (ref 0.5–1.9)

## 2017-04-05 MED ORDER — PRAVASTATIN SODIUM 40 MG PO TABS
40.0000 mg | ORAL_TABLET | Freq: Every day | ORAL | Status: DC
  Administered 2017-04-05 – 2017-04-08 (×4): 40 mg via ORAL
  Filled 2017-04-05 (×4): qty 1

## 2017-04-05 MED ORDER — SODIUM CHLORIDE 0.9 % IV BOLUS (SEPSIS)
1000.0000 mL | Freq: Once | INTRAVENOUS | Status: AC
  Administered 2017-04-05: 1000 mL via INTRAVENOUS

## 2017-04-05 MED ORDER — ACETAMINOPHEN 650 MG RE SUPP: 650.0000 mg | Freq: Four times a day (QID) | RECTAL | Status: DC | PRN

## 2017-04-05 MED ORDER — CLONAZEPAM 0.5 MG PO TABS
0.5000 mg | ORAL_TABLET | Freq: Two times a day (BID) | ORAL | Status: DC | PRN
  Administered 2017-04-05 – 2017-04-09 (×8): 0.5 mg via ORAL
  Filled 2017-04-05 (×8): qty 1

## 2017-04-05 MED ORDER — SODIUM CHLORIDE 0.9 % IV SOLN
INTRAVENOUS | Status: AC
  Administered 2017-04-05: 13:00:00 via INTRAVENOUS

## 2017-04-05 MED ORDER — IOPAMIDOL (ISOVUE-300) INJECTION 61%
INTRAVENOUS | Status: AC
  Administered 2017-04-05: 100 mL
  Filled 2017-04-05: qty 100

## 2017-04-05 MED ORDER — ENOXAPARIN SODIUM 30 MG/0.3ML ~~LOC~~ SOLN
30.0000 mg | SUBCUTANEOUS | Status: DC
  Administered 2017-04-05: 30 mg via SUBCUTANEOUS
  Filled 2017-04-05 (×2): qty 0.3

## 2017-04-05 MED ORDER — HYOSCYAMINE SULFATE 0.125 MG SL SUBL
0.1250 mg | SUBLINGUAL_TABLET | SUBLINGUAL | Status: DC | PRN
  Administered 2017-04-05 – 2017-04-06 (×2): 0.125 mg via ORAL
  Filled 2017-04-05 (×3): qty 1

## 2017-04-05 MED ORDER — METRONIDAZOLE IN NACL 5-0.79 MG/ML-% IV SOLN
500.0000 mg | Freq: Once | INTRAVENOUS | Status: AC
  Administered 2017-04-05: 500 mg via INTRAVENOUS
  Filled 2017-04-05: qty 100

## 2017-04-05 MED ORDER — OXYCODONE HCL 5 MG PO TABS
5.0000 mg | ORAL_TABLET | ORAL | Status: DC | PRN
  Administered 2017-04-06 – 2017-04-09 (×5): 5 mg via ORAL
  Filled 2017-04-05 (×5): qty 1

## 2017-04-05 MED ORDER — LOSARTAN POTASSIUM 50 MG PO TABS
50.0000 mg | ORAL_TABLET | Freq: Every day | ORAL | Status: DC
  Administered 2017-04-05 – 2017-04-06 (×2): 50 mg via ORAL
  Filled 2017-04-05 (×2): qty 1

## 2017-04-05 MED ORDER — CIPROFLOXACIN IN D5W 400 MG/200ML IV SOLN
400.0000 mg | Freq: Two times a day (BID) | INTRAVENOUS | Status: DC
  Administered 2017-04-06 (×2): 400 mg via INTRAVENOUS
  Filled 2017-04-05 (×3): qty 200

## 2017-04-05 MED ORDER — FLORANEX PO PACK
1.0000 g | PACK | Freq: Three times a day (TID) | ORAL | Status: DC
  Administered 2017-04-06 – 2017-04-08 (×6): 1 g via ORAL
  Filled 2017-04-05 (×12): qty 1

## 2017-04-05 MED ORDER — METRONIDAZOLE IN NACL 5-0.79 MG/ML-% IV SOLN
500.0000 mg | Freq: Three times a day (TID) | INTRAVENOUS | Status: DC
  Administered 2017-04-05 – 2017-04-08 (×8): 500 mg via INTRAVENOUS
  Filled 2017-04-05 (×7): qty 100

## 2017-04-05 MED ORDER — ATENOLOL 50 MG PO TABS
100.0000 mg | ORAL_TABLET | Freq: Every morning | ORAL | Status: DC
  Administered 2017-04-06: 100 mg via ORAL
  Filled 2017-04-05: qty 2

## 2017-04-05 MED ORDER — ACETAMINOPHEN 325 MG PO TABS
650.0000 mg | ORAL_TABLET | Freq: Four times a day (QID) | ORAL | Status: DC | PRN
  Administered 2017-04-05 – 2017-04-09 (×4): 650 mg via ORAL
  Filled 2017-04-05 (×3): qty 2

## 2017-04-05 MED ORDER — TRAMADOL HCL 50 MG PO TABS
50.0000 mg | ORAL_TABLET | Freq: Four times a day (QID) | ORAL | Status: DC | PRN
  Administered 2017-04-05 – 2017-04-09 (×6): 50 mg via ORAL
  Filled 2017-04-05 (×6): qty 1

## 2017-04-05 MED ORDER — GABAPENTIN 300 MG PO CAPS
300.0000 mg | ORAL_CAPSULE | Freq: Two times a day (BID) | ORAL | Status: DC
  Administered 2017-04-05 – 2017-04-09 (×9): 300 mg via ORAL
  Filled 2017-04-05 (×9): qty 1

## 2017-04-05 MED ORDER — CIPROFLOXACIN IN D5W 400 MG/200ML IV SOLN
400.0000 mg | Freq: Once | INTRAVENOUS | Status: AC
  Administered 2017-04-05: 400 mg via INTRAVENOUS
  Filled 2017-04-05: qty 200

## 2017-04-05 NOTE — ED Triage Notes (Signed)
Pt reports lower abd pain onset Tuesday, states, "Feels just like my diverticulitis." Pt reports hx abscess, reports diarrhea, denies n/v. Afebrile at this time.

## 2017-04-05 NOTE — ED Provider Notes (Signed)
[facility] DEPT Provider Note   CSN: [id number] Arrival date & time: 04/05/17  7858     History   Chief Complaint Chief Complaint  Patient presents with  . Abdominal Pain  . Diarrhea    HPI Debra White is a 83 y.o. female.  HPI Complains of diffuse abdominal pain onset 3 days ago with multiple episodes of diarrhea she reports approximately 8 episodes this morning. Diarrhea was onset 2 days ago. She denies fever. Nothing makes symptoms better or worse. Abdominal pain is worse at left side and feels like diverticulitis she's experienced in the past. She denies nausea or vomiting unknown if she's had fever. No treatment prior to coming here. Nothing makes symptoms better or worse.other associated symptoms include generalized weakness and diminished appetite No other associated symptoms. No recent travel .she has been on antibiotics recently for a rash Past Medical History:  Diagnosis Date  . Anxiety   . Arthritis   . Blood transfusion   . Candida esophagitis ([place]) 2013   EGD  . Cataract   . Chronic kidney disease    STONES  . Diverticulosis of colon (without mention of hemorrhage) 2007   Colonoscopy  . Dysrhythmia    RX  . Emphysema of lung ([place])   . Family history of colon cancer    sister  . Fibromyalgia   . GERD (gastroesophageal reflux disease)   . Headache(784.0)   . Hyperlipemia   . Hypertension   . Kidney stones   . Ptosis, bilateral     Patient Active Problem List   Diagnosis Date Noted  . COPD (chronic obstructive pulmonary disease) with emphysema ([place]) 12/21/2015  . 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 Osteoporosis 12/21/2015  . Mild malnutrition ([place]) 05/11/2015  . Encounter for Medicare annual wellness exam 04/08/2015  . Prediabetes 01/27/2014  . Medication management 01/27/2014  . 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 Vitamin D deficiency 07/08/2013  . Hypertension   . Hyperlipidemia   . GERD   . Headache, post-traumatic, chronic 09/20/2011    . Fracture, zygoma closed ([place]) 07/13/2011    Past Surgical History:  Procedure Laterality Date  . ABDOMINAL HYSTERECTOMY    . BACK SURGERY     X2  . BACK SURGERY    . CERVICAL DISC SURGERY    . COLONOSCOPY    . FACIAL FRACTURE SURGERY    . HAND SURGERY     BIL   . ORIF TRIPOD FRACTURE  07/13/2011   Procedure: OPEN REDUCTION INTERNAL FIXATION (ORIF) TRIPOD FRACTURE;  Surgeon: Tyson Alias, MD;  Location: [facility];  Service: ENT;  Laterality: Right;  ORIF RIGHT ZYGOMA, ORBITAL FLOOR EXPLORATION WITH FROST STITCH (TEMPORARY TARSORRHAPHY)  . PTOSIS REPAIR Bilateral 11/22/2016   Procedure: INTERNAL PTOSIS REPAIR;  Surgeon: Clista Bernhardt, MD;  Location: [facility];  Service: Ophthalmology;  Laterality: Bilateral;  . SHOULDER ARTHROSCOPY W/ ROTATOR CUFF REPAIR     LFT    OB History    No data available       Home Medications    Prior to Admission medications   Medication Sig Start Date End Date Taking? Authorizing Provider  acetaminophen (TYLENOL) 500 MG tablet Take 500-1,000 mg by mouth every 6 (six) hours as needed for headache.   Yes [provider]  atenolol (TENORMIN) 100 MG tablet TAKE 1 TABLET BY MOUTH EVERY MORNING 03/09/17  Yes Vicie Mutters, PA-C  cephALEXin (KEFLEX) 500 MG capsule Take 1 capsule (500 mg total) by mouth 3 (three)  times daily. 03/27/17 04/06/17 Yes Liane Comber, NP  cholecalciferol (VITAMIN D) 1000 UNITS tablet Take 4,000 Units by mouth daily.    Yes [provider]  clonazePAM (KLONOPIN) 1 MG tablet TAKE 1/2 TO 1 TABLET BY MOUTH UP TO THREE TIMES DAILY AS NEEDED FOR NERVES 03/13/17  Yes Unk Pinto, MD  gabapentin (NEURONTIN) 300 MG capsule TAKE 1 CAPSULE BY MOUTH 3-4 TIMES DAILY FOR FACIAL PAIN 01/14/17  Yes Unk Pinto, MD  losartan (COZAAR) 100 MG tablet TAKE 1/2 TABLET BY MOUTH DAILY Patient taking differently: TAKE 1/2 TABLET (50mg ) BY MOUTH IN THE EVENING 07/09/16  Yes Unk Pinto, MD  pravastatin (PRAVACHOL) 40 MG  tablet TAKE 1 TABLET BY MOUTH DAILY 03/09/17  Yes Vicie Mutters, PA-C  ranitidine (ZANTAC) 300 MG tablet TAKE 1 TABLET BY MOUTH TWICE DAILY AS NEEDED FOR ACID INDIGESTION ANDREFLUX 02/13/17  Yes Vicie Mutters, PA-C  traMADol (ULTRAM) 50 MG tablet TAKE 1 TABLET BY MOUTH 4 TIMES DAILY AS NEEDED FOR PAIN 02/05/17  Yes Vicie Mutters, PA-C  hyoscyamine (LEVSIN SL) 0.125 MG SL tablet Dissolve one tablet by mouth every 4-6 hours as needed Patient taking differently: Take 0.125 mg by mouth every 4 (four) hours as needed for cramping. Dissolve one tablet by mouth every 4-6 hours as needed 10/29/16   Unk Pinto, MD  pantoprazole (PROTONIX) 20 MG tablet Take 1 tablet (20 mg total) by mouth daily. 11/10/16 12/10/16  Julianne Rice, MD    Family History Family History  Problem Relation Age of Onset  . Heart disease Mother   . Heart disease Father   . Breast cancer Sister   . Colon cancer Sister   . Cancer Sister        breast  . Cancer Sister        colon  . Cancer Sister        colon  . Hypertension Daughter   . Hyperlipidemia Daughter     Social History Social History  Substance Use Topics  . Smoking status: Never Smoker  . Smokeless tobacco: Never Used  . Alcohol use No     Allergies   Latex; Amoxicillin; Aspirin; Barbiturates; Celebrex [celecoxib]; Codeine; Evista [raloxifene]; Lipitor [atorvastatin]; Morphine and related; Oruvail [ketoprofen]; Pantoprazole; Paraffin; Prochlorperazine; Proloprim [trimethoprim]; Venlafaxine; Vibra-tab [doxycycline]; Vioxx [rofecoxib]; Betadine [povidone iodine]; and Caffeine   Review of Systems Review of Systems  Constitutional: Positive for appetite change.  HENT: Negative.   Respiratory: Negative.   Cardiovascular: Negative.   Gastrointestinal: Positive for abdominal pain and diarrhea.  Musculoskeletal: Negative.   Skin: Negative.   Neurological: Positive for weakness.  Psychiatric/Behavioral: Negative.   All other systems reviewed and  are negative.    Physical Exam Updated Vital Signs BP (!) 150/77 (BP Location: Right Arm)   Pulse 65   Temp 98.1 F (36.7 C) (Oral)   Resp 18   SpO2 100%   Physical Exam  Constitutional: She appears well-developed and well-nourished.  Mildly ill-appearing  HENT:  Head: Normocephalic and atraumatic.  Mucous membranes dry  Eyes: Pupils are equal, round, and reactive to light. Conjunctivae are normal.  Neck: Neck supple. No tracheal deviation present. No thyromegaly present.  Cardiovascular: Normal rate and regular rhythm.   No murmur heard. Pulmonary/Chest: Effort normal and breath sounds normal.  Abdominal: Soft. Bowel sounds are normal. She exhibits no distension and no mass. There is tenderness. There is no guarding.  Tender at left upper quadrant and left lower quadrant  Musculoskeletal: Normal range of motion. She exhibits no  edema or tenderness.  Neurological: She is alert. Coordination normal.  Skin: Skin is warm and dry. No rash noted.  Psychiatric: She has a normal mood and affect.  Nursing note and vitals reviewed.    ED Treatments / Results  Labs (all labs ordered are listed, but only abnormal results are displayed) Labs Reviewed  CBC WITH DIFFERENTIAL/PLATELET - Abnormal; Notable for the following:       Result Value   WBC 20.4 (*)    Neutro Abs 17.0 (*)    Monocytes Absolute 2.5 (*)    All other components within normal limits  LIPASE, BLOOD  COMPREHENSIVE METABOLIC PANEL  URINALYSIS, ROUTINE W REFLEX MICROSCOPIC  I-STAT CG4 LACTIC ACID, ED    EKG  EKG Interpretation  Date/Time:  Friday April 05 2017 09:40:55 EDT Ventricular Rate:  66 PR Interval:  158 QRS Duration: 82 QT Interval:  416 QTC Calculation: 436 R Axis:   40 Text Interpretation:  Normal sinus rhythm ST abnormality, possible digitalis effect Abnormal ECG No significant change since last tracing Confirmed by Orlie Dakin [phone]) on 04/05/2017 10:39:08 AM       Radiology Xr  C-arm No Report  Result Date: 04/03/2017 Please see Notes or Procedures tab for imaging impression.   Procedures Procedures (including critical care time)  Medications Ordered in ED Medications  sodium chloride 0.9 % bolus 1,000 mL (not administered)    Results for orders placed or performed during the hospital encounter of 04/05/17  Lipase, blood  Result Value Ref Range   Lipase 26 11 - 51 U/L  Comprehensive metabolic panel  Result Value Ref Range   Sodium 127 (L) 135 - 145 mmol/L   Potassium 4.9 3.5 - 5.1 mmol/L   Chloride 91 (L) 101 - 111 mmol/L   CO2 26 22 - 32 mmol/L   Glucose, Bld 113 (H) 65 - 99 mg/dL   BUN 9 6 - 20 mg/dL   Creatinine, Ser 0.77 0.44 - 1.00 mg/dL   Calcium 9.3 8.9 - 10.3 mg/dL   Total Protein 7.5 6.5 - 8.1 g/dL   Albumin 4.0 3.5 - 5.0 g/dL   AST 24 15 - 41 U/L   ALT 15 14 - 54 U/L   Alkaline Phosphatase 79 38 - 126 U/L   Total Bilirubin 0.7 0.3 - 1.2 mg/dL   GFR calc non Af Amer >60 >60 mL/min   GFR calc Af Amer >60 >60 mL/min   Anion gap 10 5 - 15  CBC with Differential  Result Value Ref Range   WBC 20.4 (H) 4.0 - 10.5 K/uL   RBC 4.10 3.87 - 5.11 MIL/uL   Hemoglobin 12.5 12.0 - 15.0 g/dL   HCT 37.9 36.0 - 46.0 %   MCV 92.4 78.0 - 100.0 fL   MCH 30.5 26.0 - 34.0 pg   MCHC 33.0 30.0 - 36.0 g/dL   RDW 13.1 11.5 - 15.5 %   Platelets 275 150 - 400 K/uL   Neutrophils Relative % 83 %   Neutro Abs 17.0 (H) 1.7 - 7.7 K/uL   Lymphocytes Relative 5 %   Lymphs Abs 0.9 0.7 - 4.0 K/uL   Monocytes Relative 12 %   Monocytes Absolute 2.5 (H) 0.1 - 1.0 K/uL   Eosinophils Relative 0 %   Eosinophils Absolute 0.0 0.0 - 0.7 K/uL   Basophils Relative 0 %   Basophils Absolute 0.0 0.0 - 0.1 K/uL  I-Stat CG4 Lactic Acid, ED  Result Value Ref Range   Lactic Acid,  Venous 1.45 0.5 - 1.9 mmol/L   Dg Tibia/fibula Left  Result Date: 03/29/2017 CLINICAL DATA:  Recent trauma with on going pain abrasion to the anterior mid tibia and fibula EXAM: LEFT TIBIA AND  FIBULA - 2 VIEW COMPARISON:  None. FINDINGS: There is no evidence of fracture or other focal bone lesions. Soft tissue swelling anterior to the mid tibia. No radiopaque foreign body. IMPRESSION: No acute osseous abnormality Electronically Signed   By: Donavan Foil M.D.   On: 03/29/2017 16:14   Ct Abdomen Pelvis W Contrast  Result Date: 04/05/2017 CLINICAL DATA:  Abdominal pain, diverticulitis suspected EXAM: CT ABDOMEN AND PELVIS WITH CONTRAST TECHNIQUE: Multidetector CT imaging of the abdomen and pelvis was performed using the standard protocol following bolus administration of intravenous contrast. CONTRAST:  119mL ISOVUE-300 IOPAMIDOL (ISOVUE-300) INJECTION 61% COMPARISON:  04/13/2015 FINDINGS: Lower chest: Heart is enlarged.  Lung bases clear.  No effusions. Hepatobiliary: No focal hepatic abnormality. Gallbladder unremarkable. Pancreas: No focal abnormality or ductal dilatation. Spleen: No focal abnormality.  Normal size. Adrenals/Urinary Tract: No adrenal abnormality. No focal renal abnormality. No stones or hydronephrosis. Urinary bladder is unremarkable. Stomach/Bowel: Sigmoid diverticulosis. Indistinctness/haziness around the sigmoid colon compatible with early active diverticulitis. Stomach and small bowel decompressed, unremarkable. Vascular/Lymphatic: Aortic and iliac calcifications diffusely. No aneurysm or adenopathy. Reproductive: Prior hysterectomy.  No adnexal masses. Other: No free fluid or free air. Musculoskeletal: No acute bony abnormality. Postoperative changes in the lower lumbar spine. IMPRESSION: Sigmoid diverticulosis with changes of early active diverticulitis. Aortoiliac atherosclerosis. Electronically Signed   By: Rolm Baptise M.D.   On: 04/05/2017 11:20   Xr C-arm No Report  Result Date: 04/03/2017 Please see Notes or Procedures tab for imaging impression.  Initial Impression / Assessment and Plan / ED Course  I have reviewed the triage vital signs and the nursing  notes.  Pertinent labs & imaging results that were available during my care of the patient were reviewed by me and considered in my medical decision making (see chart for details).     Declines pain medicine IV antibiotics ordered.In light of patient's advanced age markedly elevated leukocytosis, clinical dehydration and hyponatremia will arrange for overnight stay. I've consulted hospitalist service who will arrange for overnight stay. C. Difficile assay pending in light of diarrhea Final Clinical Impressions(s) / ED Diagnoses  Diagnosis #1 sigmoiddiverticulitis #2 hyponatremia #3 dehydration Final diagnoses:  None    New Prescriptions New Prescriptions   No medications on file     Orlie Dakin, MD 04/05/17 1223

## 2017-04-05 NOTE — H&P (Signed)
History and Physical    Debra White:[id number] DOB: 1934-06-10 DOA: 04/05/2017  PCP: Unk Pinto, MD Patient coming from: home  Chief Complaint: abdominal pain/diarrhea  HPI: Debra White is a very pleasant 83 y.o. female with medical history significant for hypertension, hyperlipidemia, GERD, fibromyalgia, COPD, chronic kidney disease, anxiety, diverticulosis presents to emergency Department chief complaint abdominal pain persistent diarrhea. Initial evaluation includes CT of abdomen pelvis concerning for early diverticulitis  Information is obtained from the patient and the chart. She states about 2 weeks ago she was placed on an antibiotic for skin infection. Chart review indicates she was on Keflex. She states she had about 5 days remaining on the course and she developed rash with itching as well as diarrhea. She stopped taking the Keflex but the diarrhea did not stop. She thereafter she developed abdominal pain originating in her right lower quadrant. She states after about 24 hours the pain was diffuse and moved over to her left lower quadrant. She reports decreased oral intake. She denies nausea and vomiting and doubts she had a fever. She denies chest pain palpitation shortness of breath diaphoresis. She denies headache dizziness syncope or near-syncope. She denies dysuria hematuria frequency or urgency. She denies melena bright red blood per rectum.   ED Course: In the emergency department she's afebrile hemodynamically stable and not hypoxic. She is provided with 1 L normal saline Cipro and Flagyl  Review of Systems: As per HPI otherwise all other systems reviewed and are negative.   Ambulatory Status: Lives with her husband ambulates with a steady gait is independent with ADLs  Past Medical History:  Diagnosis Date  . Anxiety   . Arthritis   . Blood transfusion   . Candida esophagitis ([place]) 2013   EGD  . Cataract   . Chronic kidney disease    STONES  .  Diverticulosis of colon (without mention of hemorrhage) 2007   Colonoscopy  . Dysrhythmia    RX  . Emphysema of lung ([place])   . Family history of colon cancer    sister  . Fibromyalgia   . GERD (gastroesophageal reflux disease)   . Headache(784.0)   . Hyperlipemia   . Hypertension   . Kidney stones   . Ptosis, bilateral     Past Surgical History:  Procedure Laterality Date  . ABDOMINAL HYSTERECTOMY    . BACK SURGERY     X2  . BACK SURGERY    . CERVICAL DISC SURGERY    . COLONOSCOPY    . FACIAL FRACTURE SURGERY    . HAND SURGERY     BIL   . ORIF TRIPOD FRACTURE  07/13/2011   Procedure: OPEN REDUCTION INTERNAL FIXATION (ORIF) TRIPOD FRACTURE;  Surgeon: Tyson Alias, MD;  Location: [facility];  Service: ENT;  Laterality: Right;  ORIF RIGHT ZYGOMA, ORBITAL FLOOR EXPLORATION WITH FROST STITCH (TEMPORARY TARSORRHAPHY)  . PTOSIS REPAIR Bilateral 11/22/2016   Procedure: INTERNAL PTOSIS REPAIR;  Surgeon: Clista Bernhardt, MD;  Location: [facility];  Service: Ophthalmology;  Laterality: Bilateral;  . SHOULDER ARTHROSCOPY W/ ROTATOR CUFF REPAIR     LFT    Social History   Social History  . Marital status: Married    Spouse name: N/A  . Number of children: 1  . Years of education: 62   Occupational History  . Retired Retired   Social History Main Topics  . Smoking status: Never Smoker  . Smokeless tobacco: Never Used  . Alcohol use No  .  Drug use: No  . Sexual activity: Not on file     Comment: HYSTER   Other Topics Concern  . Not on file   Social History Narrative   [place].   Right-handed.   No caffeine use.    Allergies  Allergen Reactions  . Latex Hives    TONGUE HAD BLISTERS WHEN HAD DENTAL SURGERY  . Amoxicillin Nausea And Vomiting  . Aspirin     Gi upset  . Barbiturates   . Celebrex [Celecoxib]   . Codeine Itching  . Evista [Raloxifene]   . Lipitor [Atorvastatin]   . Morphine And Related   . Oruvail [Ketoprofen]   .  Pantoprazole Diarrhea  . Paraffin   . Prochlorperazine Other (See Comments)    Uncontrolled shaking  . Proloprim [Trimethoprim]   . Venlafaxine Nausea Only  . Vibra-Tab [Doxycycline]   . Vioxx [Rofecoxib]     edema  . Betadine [Povidone Iodine] Rash  . Caffeine Palpitations    Family History  Problem Relation Age of Onset  . Heart disease Mother   . Heart disease Father   . Breast cancer Sister   . Colon cancer Sister   . Cancer Sister        breast  . Cancer Sister        colon  . Cancer Sister        colon  . Hypertension Daughter   . Hyperlipidemia Daughter     Prior to Admission medications   Medication Sig Start Date End Date Taking? Authorizing Provider  acetaminophen (TYLENOL) 500 MG tablet Take 500-1,000 mg by mouth every 6 (six) hours as needed for headache.   Yes [provider]  atenolol (TENORMIN) 100 MG tablet TAKE 1 TABLET BY MOUTH EVERY MORNING 03/09/17  Yes Vicie Mutters, PA-C  cephALEXin (KEFLEX) 500 MG capsule Take 1 capsule (500 mg total) by mouth 3 (three) times daily. 03/27/17 04/06/17 Yes Liane Comber, NP  cholecalciferol (VITAMIN D) 1000 UNITS tablet Take 4,000 Units by mouth daily.    Yes [provider]  clonazePAM (KLONOPIN) 1 MG tablet TAKE 1/2 TO 1 TABLET BY MOUTH UP TO THREE TIMES DAILY AS NEEDED FOR NERVES 03/13/17  Yes Unk Pinto, MD  gabapentin (NEURONTIN) 300 MG capsule TAKE 1 CAPSULE BY MOUTH 3-4 TIMES DAILY FOR FACIAL PAIN 01/14/17  Yes Unk Pinto, MD  losartan (COZAAR) 100 MG tablet TAKE 1/2 TABLET BY MOUTH DAILY Patient taking differently: TAKE 1/2 TABLET (50mg ) BY MOUTH IN THE EVENING 07/09/16  Yes Unk Pinto, MD  pravastatin (PRAVACHOL) 40 MG tablet TAKE 1 TABLET BY MOUTH DAILY 03/09/17  Yes Vicie Mutters, PA-C  ranitidine (ZANTAC) 300 MG tablet TAKE 1 TABLET BY MOUTH TWICE DAILY AS NEEDED FOR ACID INDIGESTION ANDREFLUX 02/13/17  Yes Vicie Mutters, PA-C  traMADol (ULTRAM) 50 MG tablet TAKE 1 TABLET  BY MOUTH 4 TIMES DAILY AS NEEDED FOR PAIN 02/05/17  Yes Vicie Mutters, PA-C  hyoscyamine (LEVSIN SL) 0.125 MG SL tablet Dissolve one tablet by mouth every 4-6 hours as needed Patient taking differently: Take 0.125 mg by mouth every 4 (four) hours as needed for cramping. Dissolve one tablet by mouth every 4-6 hours as needed 10/29/16   Unk Pinto, MD  pantoprazole (PROTONIX) 20 MG tablet Take 1 tablet (20 mg total) by mouth daily. 11/10/16 12/10/16  Julianne Rice, MD    Physical Exam: Vitals:   04/05/17 0941 04/05/17 1100 04/05/17 1115 04/05/17 1136  BP: (!) 150/77 135/73 138/67  Pulse: 65 75 75   Resp: 18 15 (!) 21   Temp: 98.1 F (36.7 C)     TempSrc: Oral     SpO2: 100% 100% 100% 100%     General:  Appears calm and comfortable Sitting up in bed in no acute distress Eyes:  PERRL, EOMI, normal lids, iris ENT:  grossly normal hearing, lips & tongue, mucous membranes of her mouth are pink slightly dry Neck:  no LAD, masses or thyromegaly Cardiovascular:  RRR, no m/r/g. No LE edema. Pedal pulses present and palpable Respiratory:  CTA bilaterally, no w/r/r. Normal respiratory effort. Abdomen:  Nondistended mostly soft mild diffuse tenderness throughout. Moderate tenderness left lower quadrant with some guarding positive bowel sounds in upper quadrant sluggish and lower quadrants Skin:  no rash or induration seen on limited exam healing scrape on left shin Musculoskeletal:  grossly normal tone BUE/BLE, good ROM, no bony abnormality Psychiatric:  grossly normal mood and affect, speech fluent and appropriate, AOx3 Neurologic:  CN 2-12 grossly intact, moves all extremities in coordinated fashion, sensation intact speech clear facial symmetry  Labs on Admission: I have personally reviewed following labs and imaging studies  CBC:  Recent Labs Lab 04/05/17 0949  WBC 20.4*  NEUTROABS 17.0*  HGB 12.5  HCT 37.9  MCV 92.4  PLT 948   Basic Metabolic Panel:  Recent Labs Lab  04/05/17 0949  NA 127*  K 4.9  CL 91*  CO2 26  GLUCOSE 113*  BUN 9  CREATININE 0.77  CALCIUM 9.3   GFR: Estimated Creatinine Clearance: 43 mL/min (by C-G formula based on SCr of 0.77 mg/dL). Liver Function Tests:  Recent Labs Lab 04/05/17 0949  AST 24  ALT 15  ALKPHOS 79  BILITOT 0.7  PROT 7.5  ALBUMIN 4.0    Recent Labs Lab 04/05/17 0949  LIPASE 26   No results for input(s): AMMONIA in the last 168 hours. Coagulation Profile: No results for input(s): INR, PROTIME in the last 168 hours. Cardiac Enzymes: No results for input(s): CKTOTAL, CKMB, CKMBINDEX, TROPONINI in the last 168 hours. BNP (last 3 results) No results for input(s): PROBNP in the last 8760 hours. HbA1C: No results for input(s): HGBA1C in the last 72 hours. CBG: No results for input(s): GLUCAP in the last 168 hours. Lipid Profile: No results for input(s): CHOL, HDL, LDLCALC, TRIG, CHOLHDL, LDLDIRECT in the last 72 hours. Thyroid Function Tests: No results for input(s): TSH, T4TOTAL, FREET4, T3FREE, THYROIDAB in the last 72 hours. Anemia Panel: No results for input(s): VITAMINB12, FOLATE, FERRITIN, TIBC, IRON, RETICCTPCT in the last 72 hours. Urine analysis:    Component Value Date/Time   COLORURINE YELLOW 10/29/2016 0946   APPEARANCEUR CLEAR 10/29/2016 0946   LABSPEC 1.007 10/29/2016 0946   PHURINE 6.5 10/29/2016 0946   GLUCOSEU NEGATIVE 10/29/2016 0946   HGBUR NEGATIVE 10/29/2016 0946   BILIRUBINUR NEGATIVE 10/29/2016 0946   KETONESUR NEGATIVE 10/29/2016 0946   PROTEINUR NEGATIVE 10/29/2016 0946   UROBILINOGEN 0.2 04/13/2015 1700   NITRITE NEGATIVE 10/29/2016 0946   LEUKOCYTESUR TRACE (A) 10/29/2016 0946    Creatinine Clearance: Estimated Creatinine Clearance: 43 mL/min (by C-G formula based on SCr of 0.77 mg/dL).  Sepsis Labs: @LABRCNTIP (procalcitonin:4,lacticidven:4) )No results found for this or any previous visit (from the past 240 hour(s)).   Radiological Exams on  Admission: Ct Abdomen Pelvis W Contrast  Result Date: 04/05/2017 CLINICAL DATA:  Abdominal pain, diverticulitis suspected EXAM: CT ABDOMEN AND PELVIS WITH CONTRAST TECHNIQUE: Multidetector CT imaging of the abdomen and  pelvis was performed using the standard protocol following bolus administration of intravenous contrast. CONTRAST:  15mL ISOVUE-300 IOPAMIDOL (ISOVUE-300) INJECTION 61% COMPARISON:  04/13/2015 FINDINGS: Lower chest: Heart is enlarged.  Lung bases clear.  No effusions. Hepatobiliary: No focal hepatic abnormality. Gallbladder unremarkable. Pancreas: No focal abnormality or ductal dilatation. Spleen: No focal abnormality.  Normal size. Adrenals/Urinary Tract: No adrenal abnormality. No focal renal abnormality. No stones or hydronephrosis. Urinary bladder is unremarkable. Stomach/Bowel: Sigmoid diverticulosis. Indistinctness/haziness around the sigmoid colon compatible with early active diverticulitis. Stomach and small bowel decompressed, unremarkable. Vascular/Lymphatic: Aortic and iliac calcifications diffusely. No aneurysm or adenopathy. Reproductive: Prior hysterectomy.  No adnexal masses. Other: No free fluid or free air. Musculoskeletal: No acute bony abnormality. Postoperative changes in the lower lumbar spine. IMPRESSION: Sigmoid diverticulosis with changes of early active diverticulitis. Aortoiliac atherosclerosis. Electronically Signed   By: Rolm Baptise M.D.   On: 04/05/2017 11:20   Xr C-arm No Report  Result Date: 04/03/2017 Please see Notes or Procedures tab for imaging impression.   EKG: Independently reviewed. Normal sinus rhythmST abnormality, possible digitalis effect Abnormal ECG No significant change since last tracing  Assessment/Plan Principal Problem:   Diverticulitis Active Problems:   Hypertension   Hyperlipidemia   GERD   COPD (chronic obstructive pulmonary disease) with emphysema (HCC)   Osteoporosis   Hyponatremia   Leukocytosis   Diarrhea    Abdominal pain   #1. Abdominal pain/diarrhea.CT of the abdomen pelvis concerning for early diverticulitis in setting of recent antibiotic use. Patient with a history of diverticulitis with abscess. Leukocytosis greater than 20 the patient also with a recent steroid epidural injection. She is afebrile nontoxic appearing. He is provided with Cipro and Flagyl in the emergency department -Admit to medical floor -Clear liquids only -Supportive therapy -Gentle IV fluids -Continue Cipro and Flagyl -Stool specimen for C. Difficile  #2. Leukocytosis. WBC greater than 20. Patient with recent steroid epidural injection for low back pain. She is afebrile nontoxic appearing. Lactic acid within the limits of normal. No dynamically stable -Cipro and Flagyl as noted above -Obtain a urinalysis -Recheck in the morning  #3. Hyponatremia. Patient with decreased oral intake for the last 2-3 days. Sodium level 127. She is given 1 L of normal saline in the emergency department. -Gentle IV fluids -Monitor intake and output -Recheck in the morning  #4. Hypertension. Fair control on the emergency department. Home medications include atenolol, losartan -Continue home meds -Monitor  #5. COPD. Not on home medications. Appears stable at baseline  #6. GERD. -PPI    DVT prophylaxis: lovenox  Code Status: dnr  Family Communication: sister at bedside  Disposition Plan: home hopefully 36 hours  Consults called: none  Admission status: inpatient    Radene Gunning MD [facility]  If 7PM-7AM, please contact night-coverage [email or web address] Password [facility]  04/05/2017, 12:37 PM

## 2017-04-05 NOTE — ED Notes (Signed)
Helped patient get on the bedside toilet patient did will patient is back in the bed on the monitor patient is resting with family at beside

## 2017-04-05 NOTE — ED Notes (Signed)
Assisted pt to bedside commode.

## 2017-04-05 NOTE — Progress Notes (Signed)
Pharmacy Antibiotic Note  Debra White is a 83 y.o. female admitted on 04/05/2017 with intra-abdominal infection.  Pharmacy has been consulted for ciprofloxacin dosing. WBC elevated at 20.4. SCr 0.77.   Plan: -Ciprofloxacin 400 mg IV q 12 hours  -Monitor CBC, renal fx, cultures and clinical progress     Temp (24hrs), Avg:98.1 F (36.7 C), Min:98.1 F (36.7 C), Max:98.1 F (36.7 C)   Recent Labs Lab 04/05/17 0949 04/05/17 1013  WBC 20.4*  --   CREATININE 0.77  --   LATICACIDVEN  --  1.45    Estimated Creatinine Clearance: 43 mL/min (by C-G formula based on SCr of 0.77 mg/dL).    Allergies  Allergen Reactions  . Latex Hives    TONGUE HAD BLISTERS WHEN HAD DENTAL SURGERY  . Amoxicillin Nausea And Vomiting  . Aspirin     Gi upset  . Barbiturates   . Celebrex [Celecoxib]   . Codeine Itching  . Evista [Raloxifene]   . Lipitor [Atorvastatin]   . Morphine And Related   . Oruvail [Ketoprofen]   . Pantoprazole Diarrhea  . Paraffin   . Prochlorperazine Other (See Comments)    Uncontrolled shaking  . Proloprim [Trimethoprim]   . Venlafaxine Nausea Only  . Vibra-Tab [Doxycycline]   . Vioxx [Rofecoxib]     edema  . Betadine [Povidone Iodine] Rash  . Caffeine Palpitations    Antimicrobials this admission: Cipro 10/12 >>   Dose adjustments this admission: None   Microbiology results:  Thank you for allowing pharmacy to be a part of this patient's care.  Albertina Parr, PharmD., BCPS Clinical Pharmacist Pager [phone]

## 2017-04-06 LAB — GASTROINTESTINAL PANEL BY PCR, STOOL (REPLACES STOOL CULTURE)
ASTROVIRUS: NOT DETECTED
Adenovirus F40/41: NOT DETECTED
Campylobacter species: NOT DETECTED
Cryptosporidium: NOT DETECTED
Cyclospora cayetanensis: NOT DETECTED
ENTEROAGGREGATIVE E COLI (EAEC): NOT DETECTED
ENTEROTOXIGENIC E COLI (ETEC): NOT DETECTED
Entamoeba histolytica: NOT DETECTED
Enteropathogenic E coli (EPEC): NOT DETECTED
GIARDIA LAMBLIA: NOT DETECTED
NOROVIRUS GI/GII: NOT DETECTED
Plesimonas shigelloides: NOT DETECTED
ROTAVIRUS A: NOT DETECTED
SALMONELLA SPECIES: NOT DETECTED
SAPOVIRUS (I, II, IV, AND V): NOT DETECTED
SHIGA LIKE TOXIN PRODUCING E COLI (STEC): NOT DETECTED
Shigella/Enteroinvasive E coli (EIEC): NOT DETECTED
VIBRIO CHOLERAE: NOT DETECTED
Vibrio species: NOT DETECTED
Yersinia enterocolitica: NOT DETECTED

## 2017-04-06 LAB — CBC
HCT: 35.1 % — ABNORMAL LOW (ref 36.0–46.0)
Hemoglobin: 11.3 g/dL — ABNORMAL LOW (ref 12.0–15.0)
MCH: 30.1 pg (ref 26.0–34.0)
MCHC: 32.2 g/dL (ref 30.0–36.0)
MCV: 93.4 fL (ref 78.0–100.0)
PLATELETS: 255 10*3/uL (ref 150–400)
RBC: 3.76 MIL/uL — AB (ref 3.87–5.11)
RDW: 13.2 % (ref 11.5–15.5)
WBC: 13.9 10*3/uL — AB (ref 4.0–10.5)

## 2017-04-06 LAB — C DIFFICILE QUICK SCREEN W PCR REFLEX
C Diff antigen: POSITIVE — AB
C Diff toxin: NEGATIVE

## 2017-04-06 LAB — BASIC METABOLIC PANEL
Anion gap: 11 (ref 5–15)
CALCIUM: 8.7 mg/dL — AB (ref 8.9–10.3)
CHLORIDE: 97 mmol/L — AB (ref 101–111)
CO2: 22 mmol/L (ref 22–32)
CREATININE: 0.61 mg/dL (ref 0.44–1.00)
Glucose, Bld: 123 mg/dL — ABNORMAL HIGH (ref 65–99)
Potassium: 3.9 mmol/L (ref 3.5–5.1)
SODIUM: 130 mmol/L — AB (ref 135–145)

## 2017-04-06 LAB — CLOSTRIDIUM DIFFICILE BY PCR: CDIFFPCR: POSITIVE — AB

## 2017-04-06 MED ORDER — SODIUM CHLORIDE 0.9 % IV SOLN
INTRAVENOUS | Status: DC
  Administered 2017-04-06 – 2017-04-09 (×3): via INTRAVENOUS

## 2017-04-06 MED ORDER — VANCOMYCIN 50 MG/ML ORAL SOLUTION
125.0000 mg | Freq: Four times a day (QID) | ORAL | Status: DC
Start: 2017-04-06 — End: 2017-04-09
  Administered 2017-04-06 – 2017-04-09 (×11): 125 mg via ORAL
  Filled 2017-04-06 (×13): qty 2.5

## 2017-04-06 MED ORDER — ATENOLOL 50 MG PO TABS
50.0000 mg | ORAL_TABLET | Freq: Every morning | ORAL | Status: DC
  Administered 2017-04-07 – 2017-04-09 (×3): 50 mg via ORAL
  Filled 2017-04-06 (×3): qty 1

## 2017-04-06 MED ORDER — HYDROCORTISONE 2.5 % RE CREA
TOPICAL_CREAM | Freq: Two times a day (BID) | RECTAL | Status: DC
  Administered 2017-04-06: 1 via RECTAL
  Administered 2017-04-06: 13:00:00 via RECTAL
  Administered 2017-04-07: 1 via RECTAL
  Administered 2017-04-07: 10:00:00 via RECTAL
  Administered 2017-04-08: 1 via RECTAL
  Administered 2017-04-08 – 2017-04-09 (×2): via RECTAL
  Filled 2017-04-06: qty 28.35

## 2017-04-06 MED ORDER — FAMOTIDINE 20 MG PO TABS
20.0000 mg | ORAL_TABLET | Freq: Every day | ORAL | Status: DC
  Administered 2017-04-06 – 2017-04-09 (×4): 20 mg via ORAL
  Filled 2017-04-06 (×5): qty 1

## 2017-04-06 NOTE — Progress Notes (Signed)
[facility] Progress Note  Patient: Debra White ZOX:[id number]   PCP: Unk Pinto, MD DOB: May 22, 1934   DOA: 04/05/2017   DOS: 04/06/2017   Date of Service: the patient was seen and examined on 04/06/2017  Subjective: Continues to have diarrhea. Had one maroon color bowel movement while I was interviewing. Still has some abdominal pain as well. No fever no chills no nausea. No vomiting. Reportedly feeling better than yesterday.  Brief hospital course: Pt. with PMH of hypertension, HLD, GERD, COPD, CKD, recent treatment for cellulitis with Keflex; admitted on 04/05/2017, presented with complaint of diarrhea and abdominal pain, was found to have C. difficile colitis. Currently further plan is continue IV Flagyl and oral vancomycin.  Assessment and Plan: 1. C. difficile colitis. Present on admission. CT scan abdomen shows early acute diverticulitis. Patient started on IV Cipro and Flagyl on admission. Leukocytosis better. C. difficile antigen positive, toxin negative, toxigenic C. difficile PCR positive. Has some hematochezia. We'll start the patient on oral vancomycin continue with IV Flagyl for now. Continue IV hydration. On clear liquid diet for bowel rest. When necessary pain and nausea medication.  2. Hematochezia. Hold Lovenox. SCD for DVT prophylaxis. Continue IV fluids for now. Monitor daily CBC. Transfuse for hemoglobin less than 7. Currently no indication to consult GI as more likely associated with C. difficile colitis.  3. Essential hypertension. Continuing atenolol per home regimen.  4. Hyponatremia. Sodium getting better with IV hydration, continue.  Diet: Clear liquid diet DVT Prophylaxis: mechanical compression device  Advance goals of care discussion: DNR DNI  Family Communication: family was present at bedside, at the time of interview. The pt provided permission to discuss medical plan with the family. Opportunity was given to ask question and  all questions were answered satisfactorily.   Disposition:  Discharge to home.  Consultants: none Procedures: none  Antibiotics: Anti-infectives    Start     Dose/Rate Route Frequency Ordered Stop   04/06/17 2000  vancomycin (VANCOCIN) 50 mg/mL oral solution 125 mg     125 mg Oral 4 times daily 04/06/17 1900 04/16/17 2159   04/06/17 0100  ciprofloxacin (CIPRO) IVPB 400 mg  Status:  Discontinued     400 mg 200 mL/hr over 60 Minutes Intravenous Every 12 hours 04/05/17 1250 04/06/17 1900   04/05/17 1230  metroNIDAZOLE (FLAGYL) IVPB 500 mg     500 mg 100 mL/hr over 60 Minutes Intravenous Every 8 hours 04/05/17 1218     04/05/17 1215  ciprofloxacin (CIPRO) IVPB 400 mg     400 mg 200 mL/hr over 60 Minutes Intravenous  Once 04/05/17 1202 04/05/17 1359   04/05/17 1215  metroNIDAZOLE (FLAGYL) IVPB 500 mg     500 mg 100 mL/hr over 60 Minutes Intravenous  Once 04/05/17 1202 04/05/17 1359       Objective: Physical Exam: Vitals:   04/05/17 2006 04/05/17 2028 04/06/17 0607 04/06/17 1534  BP: (!) 143/75  126/67 126/62  Pulse: 68  75 65  Resp: 18  18 18   Temp: 98.4 F (36.9 C)  97.7 F (36.5 C) (!) 97.4 F (36.3 C)  TempSrc: Oral  Oral Oral  SpO2: 100%  100% 100%  Weight:  49.3 kg (108 lb 11.2 oz)    Height:  5\' 4"  (1.626 m)      Intake/Output Summary (Last 24 hours) at 04/06/17 1906 Last data filed at 04/06/17 1830  Gross per 24 hour  Intake          2911.25 ml  Output                0 ml  Net          2911.25 ml   Filed Weights   04/05/17 2028  Weight: 49.3 kg (108 lb 11.2 oz)   General: Alert, Awake and Oriented to Time, Place and Person. Appear in moderate distress, affect appropriate Eyes: PERRL, Conjunctiva normal ENT: Oral Mucosa clear moist. Neck: no JVD, no Abnormal Mass Or lumps Cardiovascular: S1 and S2 Present, no Murmur, Peripheral Pulses Present Respiratory: normal respiratory effort, Bilateral Air entry equal and Decreased, no use of accessory muscle, Clear  to Auscultation, no Crackles, no wheezes Abdomen: Bowel Sound present, Soft and no tenderness, no hernia Skin: no redness, no Rash, no induration Extremities: no Pedal edema, no calf tenderness Neurologic: Grossly no focal neuro deficit. Bilaterally Equal motor strength  Data Reviewed: CBC:  Recent Labs Lab 04/05/17 0949 04/06/17 0645  WBC 20.4* 13.9*  NEUTROABS 17.0*  --   HGB 12.5 11.3*  HCT 37.9 35.1*  MCV 92.4 93.4  PLT 275 808   Basic Metabolic Panel:  Recent Labs Lab 04/05/17 0949 04/06/17 0645  NA 127* 130*  K 4.9 3.9  CL 91* 97*  CO2 26 22  GLUCOSE 113* 123*  BUN 9 <5*  CREATININE 0.77 0.61  CALCIUM 9.3 8.7*    Liver Function Tests:  Recent Labs Lab 04/05/17 0949  AST 24  ALT 15  ALKPHOS 79  BILITOT 0.7  PROT 7.5  ALBUMIN 4.0    Recent Labs Lab 04/05/17 0949  LIPASE 26   No results for input(s): AMMONIA in the last 168 hours. Coagulation Profile: No results for input(s): INR, PROTIME in the last 168 hours. Cardiac Enzymes: No results for input(s): CKTOTAL, CKMB, CKMBINDEX, TROPONINI in the last 168 hours. BNP (last 3 results) No results for input(s): PROBNP in the last 8760 hours. CBG: No results for input(s): GLUCAP in the last 168 hours. Studies: No results found.  Scheduled Meds: . atenolol  50 mg Oral q morning - 10a  . enoxaparin (LOVENOX) injection  30 mg Subcutaneous Q24H  . famotidine  20 mg Oral Daily  . gabapentin  300 mg Oral BID  . hydrocortisone   Rectal BID  . lactobacillus  1 g Oral TID WC  . pravastatin  40 mg Oral q1800  . vancomycin  125 mg Oral QID   Continuous Infusions: . metronidazole Stopped (04/06/17 1416)   PRN Meds: acetaminophen **OR** acetaminophen, clonazePAM, oxyCODONE, traMADol  Time spent: 35 minutes  Author: Berle Mull, MD [facility] Pager: [phone] 04/06/2017 7:06 PM  If 7PM-7AM, please contact night-coverage at [email or web address], password [facility]

## 2017-04-07 LAB — CBC WITH DIFFERENTIAL/PLATELET
BASOS ABS: 0 10*3/uL (ref 0.0–0.1)
BASOS PCT: 0 %
Eosinophils Absolute: 0 10*3/uL (ref 0.0–0.7)
Eosinophils Relative: 0 %
HCT: 35.4 % — ABNORMAL LOW (ref 36.0–46.0)
Hemoglobin: 11.5 g/dL — ABNORMAL LOW (ref 12.0–15.0)
LYMPHS PCT: 11 %
Lymphs Abs: 1.1 10*3/uL (ref 0.7–4.0)
MCH: 30.1 pg (ref 26.0–34.0)
MCHC: 32.5 g/dL (ref 30.0–36.0)
MCV: 92.7 fL (ref 78.0–100.0)
MONO ABS: 1.2 10*3/uL — AB (ref 0.1–1.0)
Monocytes Relative: 12 %
Neutro Abs: 7.1 10*3/uL (ref 1.7–7.7)
Neutrophils Relative %: 77 %
PLATELETS: 280 10*3/uL (ref 150–400)
RBC: 3.82 MIL/uL — ABNORMAL LOW (ref 3.87–5.11)
RDW: 13.2 % (ref 11.5–15.5)
WBC: 9.4 10*3/uL (ref 4.0–10.5)

## 2017-04-07 LAB — COMPREHENSIVE METABOLIC PANEL
ALT: 14 U/L (ref 14–54)
ANION GAP: 10 (ref 5–15)
AST: 21 U/L (ref 15–41)
Albumin: 3.4 g/dL — ABNORMAL LOW (ref 3.5–5.0)
Alkaline Phosphatase: 68 U/L (ref 38–126)
CHLORIDE: 99 mmol/L — AB (ref 101–111)
CO2: 26 mmol/L (ref 22–32)
Calcium: 8.8 mg/dL — ABNORMAL LOW (ref 8.9–10.3)
Creatinine, Ser: 0.66 mg/dL (ref 0.44–1.00)
Glucose, Bld: 111 mg/dL — ABNORMAL HIGH (ref 65–99)
POTASSIUM: 3.1 mmol/L — AB (ref 3.5–5.1)
Sodium: 135 mmol/L (ref 135–145)
Total Bilirubin: 0.3 mg/dL (ref 0.3–1.2)
Total Protein: 6.9 g/dL (ref 6.5–8.1)

## 2017-04-07 LAB — LACTIC ACID, PLASMA: LACTIC ACID, VENOUS: 1.4 mmol/L (ref 0.5–1.9)

## 2017-04-07 LAB — MAGNESIUM: MAGNESIUM: 1.5 mg/dL — AB (ref 1.7–2.4)

## 2017-04-07 MED ORDER — ALUM & MAG HYDROXIDE-SIMETH 200-200-20 MG/5ML PO SUSP
15.0000 mL | Freq: Four times a day (QID) | ORAL | Status: DC | PRN
  Administered 2017-04-07 – 2017-04-08 (×4): 15 mL via ORAL
  Filled 2017-04-07 (×4): qty 30

## 2017-04-07 NOTE — Progress Notes (Signed)
[facility] Progress Note  Patient: Debra White CXK:[id number]   PCP: Unk Pinto, MD DOB: [birth year]   DOA: 04/05/2017   DOS: 04/07/2017   Date of Service: the patient was seen and examined on 04/07/2017  Subjective: Diarrhea frequency as well as bleeding per episode has reduced. Abdominal pain is about the same. No nausea no vomiting. Appetite is still poor.  Brief hospital course: Pt. with PMH of hypertension, HLD, GERD, COPD, CKD, recent treatment for cellulitis with Keflex; admitted on 04/05/2017, presented with complaint of diarrhea and abdominal pain, was found to have C. difficile colitis. Currently further plan is continue IV Flagyl and oral vancomycin.  Assessment and Plan: 1. C. difficile colitis. Present on admission. CT scan abdomen shows early acute diverticulitis. Patient started on IV Cipro and Flagyl on admission. Leukocytosis better. C. difficile antigen positive, toxin negative, toxigenic C. difficile PCR positive. Has some hematochezia. We'll start the patient on oral vancomycin continue with IV Flagyl for now. Continue IV hydration. On clear liquid diet for bowel rest. When necessary pain and nausea medication.  2. Hematochezia. Hold Lovenox. SCD for DVT prophylaxis. Continue IV fluids for now. Monitor daily CBC. Hemoglobin since remained stable will monitor for now Transfuse for hemoglobin less than 7. Currently no indication to consult GI as more likely associated with C. difficile colitis.  3. Essential hypertension. Continuing atenolol per home regimen.  4. Hyponatremia. Sodium getting better with IV hydration, continue.  Diet: Clear liquid diet DVT Prophylaxis: mechanical compression device  Advance goals of care discussion: DNR DNI  Family Communication: family was present at bedside, at the time of interview. The pt provided permission to discuss medical plan with the family. Opportunity was given to ask question and all questions  were answered satisfactorily.   Disposition:  Discharge to home.  Consultants: none Procedures: none  Antibiotics: Anti-infectives    Start     Dose/Rate Route Frequency Ordered Stop   04/06/17 2000  vancomycin (VANCOCIN) 50 mg/mL oral solution 125 mg     125 mg Oral 4 times daily 04/06/17 1900 04/16/17 2159   04/06/17 0100  ciprofloxacin (CIPRO) IVPB 400 mg  Status:  Discontinued     400 mg 200 mL/hr over 60 Minutes Intravenous Every 12 hours 04/05/17 1250 04/06/17 1900   04/05/17 1230  metroNIDAZOLE (FLAGYL) IVPB 500 mg     500 mg 100 mL/hr over 60 Minutes Intravenous Every 8 hours 04/05/17 1218     04/05/17 1215  ciprofloxacin (CIPRO) IVPB 400 mg     400 mg 200 mL/hr over 60 Minutes Intravenous  Once 04/05/17 1202 04/05/17 1359   04/05/17 1215  metroNIDAZOLE (FLAGYL) IVPB 500 mg     500 mg 100 mL/hr over 60 Minutes Intravenous  Once 04/05/17 1202 04/05/17 1359       Objective: Physical Exam: Vitals:   04/06/17 1534 04/06/17 2115 04/07/17 0634 04/07/17 1314  BP: 126/62 (!) 147/89 (!) 154/83 118/68  Pulse: 65 88 (!) 110 68  Resp: 18 18 18 18   Temp: (!) 97.4 F (36.3 C) (!) 97.5 F (36.4 C) 98.5 F (36.9 C) 98.3 F (36.8 C)  TempSrc: Oral Oral Oral Oral  SpO2: 100% 100% 98% 91%  Weight:      Height:        Intake/Output Summary (Last 24 hours) at 04/07/17 1630 Last data filed at 04/07/17 1107  Gross per 24 hour  Intake             2260 ml  Output                0 ml  Net             2260 ml   Filed Weights   04/05/17 2028  Weight: 49.3 kg (108 lb 11.2 oz)   General: Alert, Awake and Oriented to Time, Place and Person. Appear in moderate distress, affect appropriate Eyes: PERRL, Conjunctiva normal ENT: Oral Mucosa clear moist. Neck: no JVD, no Abnormal Mass Or lumps Cardiovascular: S1 and S2 Present, no Murmur, Peripheral Pulses Present Respiratory: normal respiratory effort, Bilateral Air entry equal and Decreased, no use of accessory muscle, Clear to  Auscultation, no Crackles, no wheezes Abdomen: Bowel Sound present, Soft and no tenderness, no hernia Skin: no redness, no Rash, no induration Extremities: no Pedal edema, no calf tenderness Neurologic: Grossly no focal neuro deficit. Bilaterally Equal motor strength  Data Reviewed: CBC:  Recent Labs Lab 04/05/17 0949 04/06/17 0645 04/07/17 0516  WBC 20.4* 13.9* 9.4  NEUTROABS 17.0*  --  7.1  HGB 12.5 11.3* 11.5*  HCT 37.9 35.1* 35.4*  MCV 92.4 93.4 92.7  PLT 275 255 465   Basic Metabolic Panel:  Recent Labs Lab 04/05/17 0949 04/06/17 0645 04/07/17 0516  NA 127* 130* 135  K 4.9 3.9 3.1*  CL 91* 97* 99*  CO2 26 22 26   GLUCOSE 113* 123* 111*  BUN 9 <5* <5*  CREATININE 0.77 0.61 0.66  CALCIUM 9.3 8.7* 8.8*  MG  --   --  1.5*    Liver Function Tests:  Recent Labs Lab 04/05/17 0949 04/07/17 0516  AST 24 21  ALT 15 14  ALKPHOS 79 68  BILITOT 0.7 0.3  PROT 7.5 6.9  ALBUMIN 4.0 3.4*    Recent Labs Lab 04/05/17 0949  LIPASE 26   No results for input(s): AMMONIA in the last 168 hours. Coagulation Profile: No results for input(s): INR, PROTIME in the last 168 hours. Cardiac Enzymes: No results for input(s): CKTOTAL, CKMB, CKMBINDEX, TROPONINI in the last 168 hours. BNP (last 3 results) No results for input(s): PROBNP in the last 8760 hours. CBG: No results for input(s): GLUCAP in the last 168 hours. Studies: No results found.  Scheduled Meds: . atenolol  50 mg Oral q morning - 10a  . famotidine  20 mg Oral Daily  . gabapentin  300 mg Oral BID  . hydrocortisone   Rectal BID  . lactobacillus  1 g Oral TID WC  . pravastatin  40 mg Oral q1800  . vancomycin  125 mg Oral QID   Continuous Infusions: . sodium chloride 75 mL/hr at 04/07/17 0618  . metronidazole 500 mg (04/07/17 1432)   PRN Meds: acetaminophen **OR** acetaminophen, alum & mag hydroxide-simeth, clonazePAM, oxyCODONE, traMADol  Time spent: 35 minutes  Author: Berle Mull, MD [facility] Pager: [phone] 04/07/2017 4:30 PM  If 7PM-7AM, please contact night-coverage at [email or web address], password [email or web address]

## 2017-04-08 DIAGNOSIS — R197 Diarrhea, unspecified: Secondary | ICD-10-CM

## 2017-04-08 DIAGNOSIS — R1032 Left lower quadrant pain: Secondary | ICD-10-CM

## 2017-04-08 DIAGNOSIS — K922 Gastrointestinal hemorrhage, unspecified: Secondary | ICD-10-CM

## 2017-04-08 LAB — CBC
HEMATOCRIT: 33.1 % — AB (ref 36.0–46.0)
Hemoglobin: 10.5 g/dL — ABNORMAL LOW (ref 12.0–15.0)
MCH: 29.7 pg (ref 26.0–34.0)
MCHC: 31.7 g/dL (ref 30.0–36.0)
MCV: 93.8 fL (ref 78.0–100.0)
Platelets: 273 10*3/uL (ref 150–400)
RBC: 3.53 MIL/uL — ABNORMAL LOW (ref 3.87–5.11)
RDW: 13.3 % (ref 11.5–15.5)
WBC: 8.9 10*3/uL (ref 4.0–10.5)

## 2017-04-08 LAB — BASIC METABOLIC PANEL
ANION GAP: 9 (ref 5–15)
BUN: <5 mg/dL — ABNORMAL LOW (ref 6–20)
CO2: 27 mmol/L (ref 22–32)
Calcium: 8.4 mg/dL — ABNORMAL LOW (ref 8.9–10.3)
Chloride: 101 mmol/L (ref 101–111)
Creatinine, Ser: 0.61 mg/dL (ref 0.44–1.00)
Glucose, Bld: 98 mg/dL (ref 65–99)
POTASSIUM: 3.2 mmol/L — AB (ref 3.5–5.1)
SODIUM: 137 mmol/L (ref 135–145)

## 2017-04-08 LAB — MAGNESIUM: MAGNESIUM: 1.5 mg/dL — AB (ref 1.7–2.4)

## 2017-04-08 MED ORDER — SACCHAROMYCES BOULARDII 250 MG PO CAPS
250.0000 mg | ORAL_CAPSULE | Freq: Two times a day (BID) | ORAL | Status: DC
  Administered 2017-04-08 – 2017-04-09 (×3): 250 mg via ORAL
  Filled 2017-04-08 (×3): qty 1

## 2017-04-08 MED ORDER — MAGNESIUM SULFATE 4 GM/100ML IV SOLN
4.0000 g | Freq: Once | INTRAVENOUS | Status: AC
  Administered 2017-04-08: 4 g via INTRAVENOUS
  Filled 2017-04-08: qty 100

## 2017-04-08 MED ORDER — POTASSIUM CHLORIDE CRYS ER 20 MEQ PO TBCR
40.0000 meq | EXTENDED_RELEASE_TABLET | Freq: Once | ORAL | Status: AC
  Administered 2017-04-08: 40 meq via ORAL
  Filled 2017-04-08: qty 2

## 2017-04-08 NOTE — Progress Notes (Signed)
[facility] Progress Note  Patient: Debra White UXN:[id number]   PCP: Unk Pinto, MD DOB: [birth year]   DOA: 04/05/2017   DOS: 04/08/2017   Date of Service: the patient was seen and examined on 04/08/2017  Subjective: diarrhea frequency has improved but patient started passing clots of blood. Still has abdominal pain. No nausea no vomiting.  Brief hospital course: Pt. with PMH of hypertension, HLD, GERD, COPD, CKD, recent treatment for cellulitis with Keflex; admitted on 04/05/2017, presented with complaint of diarrhea and abdominal pain, was found to have C. difficile colitis. Currently further plan is continue IV Flagyl and oral vancomycin.  Assessment and Plan: 1. C. difficile colitis. Present on admission. CT scan abdomen shows early acute diverticulitis. Patient started on IV Cipro and Flagyl on admission. Leukocytosis better. C. difficile antigen positive, toxin negative, toxigenic C. difficile PCR positive.based on criteria set up by ID this patient should be treated for C. Difficile. Has some hematochezia. We'll start the patient on oral vancomycin continue with IV Flagyl for now. Continue IV hydration. On clear liquid diet for bowel rest. When necessary pain and nausea medication.  2. Hematochezia. Hold Lovenox. SCD for DVT prophylaxis. Continue IV fluids for now. Monitor daily CBC. Hemoglobin since remained stable will monitor for now Transfuse for hemoglobin less than 7. GI was consulted due to patient passing clots with abdominal pain. Currently recommends no further intervention Recommends to advance diet to full liquid.  3. Essential hypertension. Continuing atenolol per home regimen.  4. Hyponatremia. Sodium getting better with IV hydration, continue.  Diet: full liquid diet DVT Prophylaxis: mechanical compression device  Advance goals of care discussion: DNR DNI  Family Communication: family was present at bedside, at the time of interview.  The pt provided permission to discuss medical plan with the family. Opportunity was given to ask question and all questions were answered satisfactorily.   Disposition:  Discharge to home.  Consultants: none Procedures: none  Antibiotics: Anti-infectives    Start     Dose/Rate Route Frequency Ordered Stop   04/06/17 2000  vancomycin (VANCOCIN) 50 mg/mL oral solution 125 mg     125 mg Oral 4 times daily 04/06/17 1900 04/16/17 2159   04/06/17 0100  ciprofloxacin (CIPRO) IVPB 400 mg  Status:  Discontinued     400 mg 200 mL/hr over 60 Minutes Intravenous Every 12 hours 04/05/17 1250 04/06/17 1900   04/05/17 1230  metroNIDAZOLE (FLAGYL) IVPB 500 mg  Status:  Discontinued     500 mg 100 mL/hr over 60 Minutes Intravenous Every 8 hours 04/05/17 1218 04/08/17 0917   04/05/17 1215  ciprofloxacin (CIPRO) IVPB 400 mg     400 mg 200 mL/hr over 60 Minutes Intravenous  Once 04/05/17 1202 04/05/17 1359   04/05/17 1215  metroNIDAZOLE (FLAGYL) IVPB 500 mg     500 mg 100 mL/hr over 60 Minutes Intravenous  Once 04/05/17 1202 04/05/17 1359       Objective: Physical Exam: Vitals:   04/07/17 2122 04/08/17 0626 04/08/17 1400 04/08/17 1657  BP: (!) 146/68 (!) 147/71 (!) 155/66 (!) 143/85  Pulse: 72 70 64 78  Resp: 18 20    Temp: 98.6 F (37 C) (!) 97.5 F (36.4 C) 98 F (36.7 C)   TempSrc: Oral Oral Oral   SpO2: 98% 97% 98%   Weight:      Height:        Intake/Output Summary (Last 24 hours) at 04/08/17 1913 Last data filed at 04/08/17 1400  Gross per  24 hour  Intake             1770 ml  Output                0 ml  Net             1770 ml   Filed Weights   04/05/17 2028  Weight: 49.3 kg (108 lb 11.2 oz)   General: Alert, Awake and Oriented to Time, Place and Person. Appear in moderate distress, affect appropriate Eyes: PERRL, Conjunctiva normal ENT: Oral Mucosa clear moist. Neck: no JVD, no Abnormal Mass Or lumps Cardiovascular: S1 and S2 Present, no Murmur, Peripheral Pulses  Present Respiratory: normal respiratory effort, Bilateral Air entry equal and Decreased, no use of accessory muscle, Clear to Auscultation, no Crackles, no wheezes Abdomen: Bowel Sound present, Soft and no tenderness, no hernia Skin: no redness, no Rash, no induration Extremities: no Pedal edema, no calf tenderness Neurologic: Grossly no focal neuro deficit. Bilaterally Equal motor strength  Data Reviewed: CBC:  Recent Labs Lab 04/05/17 0949 04/06/17 0645 04/07/17 0516 04/08/17 0348  WBC 20.4* 13.9* 9.4 8.9  NEUTROABS 17.0*  --  7.1  --   HGB 12.5 11.3* 11.5* 10.5*  HCT 37.9 35.1* 35.4* 33.1*  MCV 92.4 93.4 92.7 93.8  PLT 275 255 280 916   Basic Metabolic Panel:  Recent Labs Lab 04/05/17 0949 04/06/17 0645 04/07/17 0516 04/08/17 0348  NA 127* 130* 135 137  K 4.9 3.9 3.1* 3.2*  CL 91* 97* 99* 101  CO2 26 22 26 27   GLUCOSE 113* 123* 111* 98  BUN 9 <5* <5* <5*  CREATININE 0.77 0.61 0.66 0.61  CALCIUM 9.3 8.7* 8.8* 8.4*  MG  --   --  1.5* 1.5*    Liver Function Tests:  Recent Labs Lab 04/05/17 0949 04/07/17 0516  AST 24 21  ALT 15 14  ALKPHOS 79 68  BILITOT 0.7 0.3  PROT 7.5 6.9  ALBUMIN 4.0 3.4*    Recent Labs Lab 04/05/17 0949  LIPASE 26   No results for input(s): AMMONIA in the last 168 hours. Coagulation Profile: No results for input(s): INR, PROTIME in the last 168 hours. Cardiac Enzymes: No results for input(s): CKTOTAL, CKMB, CKMBINDEX, TROPONINI in the last 168 hours. BNP (last 3 results) No results for input(s): PROBNP in the last 8760 hours. CBG: No results for input(s): GLUCAP in the last 168 hours. Studies: No results found.  Scheduled Meds: . atenolol  50 mg Oral q morning - 10a  . famotidine  20 mg Oral Daily  . gabapentin  300 mg Oral BID  . hydrocortisone   Rectal BID  . pravastatin  40 mg Oral q1800  . saccharomyces boulardii  250 mg Oral BID  . vancomycin  125 mg Oral QID   Continuous Infusions: . sodium chloride 75 mL/hr  at 04/07/17 0618   PRN Meds: acetaminophen **OR** acetaminophen, alum & mag hydroxide-simeth, clonazePAM, oxyCODONE, traMADol  Time spent: 35 minutes  Author: Berle Mull, MD [facility] Pager: [phone] 04/08/2017 7:13 PM  If 7PM-7AM, please contact night-coverage at [email or web address], password [email or web address]

## 2017-04-08 NOTE — Consult Note (Signed)
[facility] Consult: 11:56 AM 04/08/2017  LOS: 3 days    Referring Provider: Dr Posey Pronto  Primary Care Physician:  Unk Pinto, MD Primary Gastroenterologist:  Dr. Sharlett Iles >> [facility].      Reason for Consultation:  Bloody diarrhea in patient with C. Difficile colitis.   HPI: Debra White is a 83 y.o. female. History of sigmoid diverticulitis with small abscess in 2014.  GERD.  IBS.   2 weeks ago she started a course of Keflex for skin infection.  On Thursday she underwent injection into her spine for pain management, she was having some left-sided abdominal pain. By Friday this was definitely worse in the left lower quadrant and reminded her of previous diverticulitis. She was having non-bloody diarrhea, she called her doctor who told her to go to the ER.   Patient is vague as to the start day of diarrhea but she does recall having been constipated in the earlier to mid part of last week.  She had stopped taking antibiotic because of a welt that showed up on her right arm, she stopped this about day 7 of 10. She was admitted on Friday. 04/05/17 CT abdomen pelvis showed sigmoid diverticulosis and indistinct haziness in the sigmoid colon compatible with early diverticulitis. She also had diffuse aortic and iliac calcifications.   WBCs as high as 20.4.  These have normalized.She was started on IV Cipro and Flagyl. The Flagyl continues but the IV Cipro was stopped within 24 hours.   C. Difficile antigen and toxigenic C. Difficile were positive,   cdiff toxin negative. .  The reading on the C. Difficile was equivocal/indetrminate.  Oral vancomycin and lactobacillus started and now day 3.  Possibly starting Saturday but more likely starting Sunday she began seeing hematochezia. She had 2 episodes of what sound like  large volume hematochezia yesterday, today her stool is brown, soft/loose and she saw a few small clots of blood.Pain in her left lower quadrant has improved but she is still requiring pain meds.  From her perspective the symptoms of the pain were totally consistent with previous bouts of diverticulitis.  Colonoscopy and upper endoscopies were performed in 02/2012.  Indications were a sister with a history of colon cancer and pts personal history of diarrhea  EGD was grossly normal. Random gastric and duodenal biopsies were all normal. No celiac disease. No H. Pylori. Colonoscopy revealed dense diverticulosis in the descending and sigmoid colon. M.D. Removed diminutive sigmoid polyp. Random colon biopsies did not reveal microscopic/collagenous colitis. Polyp pathology showed normal colonic mucosa with under lying lymphoid aggregates. There are no plans to repeat colonoscopy screening, she has aged out.    Past Medical History:  Diagnosis Date  . Anxiety   . Arthritis   . Blood transfusion   . Candida esophagitis ([place]) 2013   EGD  . Cataract   . Chronic kidney disease    STONES  . Diverticulosis of colon (without mention of hemorrhage) 2007   Colonoscopy  . Dysrhythmia    RX  . Emphysema of lung ([place])   .  Family history of colon cancer    sister  . Fibromyalgia   . GERD (gastroesophageal reflux disease)   . Headache(784.0)   . Hyperlipemia   . Hypertension   . Kidney stones   . Ptosis, bilateral     Past Surgical History:  Procedure Laterality Date  . ABDOMINAL HYSTERECTOMY    . BACK SURGERY     X2  . BACK SURGERY    . CERVICAL DISC SURGERY    . COLONOSCOPY    . FACIAL FRACTURE SURGERY    . HAND SURGERY     BIL   . ORIF TRIPOD FRACTURE  07/13/2011   Procedure: OPEN REDUCTION INTERNAL FIXATION (ORIF) TRIPOD FRACTURE;  Surgeon: Tyson Alias, MD;  Location: [facility];  Service: ENT;  Laterality: Right;  ORIF RIGHT ZYGOMA, ORBITAL FLOOR EXPLORATION WITH FROST STITCH (TEMPORARY  TARSORRHAPHY)  . PTOSIS REPAIR Bilateral 11/22/2016   Procedure: INTERNAL PTOSIS REPAIR;  Surgeon: Clista Bernhardt, MD;  Location: [facility];  Service: Ophthalmology;  Laterality: Bilateral;  . SHOULDER ARTHROSCOPY W/ ROTATOR CUFF REPAIR     LFT    Prior to Admission medications   Medication Sig Start Date End Date Taking? Authorizing Provider  acetaminophen (TYLENOL) 500 MG tablet Take 500-1,000 mg by mouth every 6 (six) hours as needed for headache.   Yes [provider]  atenolol (TENORMIN) 100 MG tablet TAKE 1 TABLET BY MOUTH EVERY MORNING Patient taking differently: TAKE 1/2 TABLET BY MOUTH EVERY MORNING 03/09/17  Yes Vicie Mutters, PA-C  cholecalciferol (VITAMIN D) 1000 UNITS tablet Take 4,000 Units by mouth daily.    Yes [provider]  clonazePAM (KLONOPIN) 1 MG tablet TAKE 1/2 TO 1 TABLET BY MOUTH UP TO THREE TIMES DAILY AS NEEDED FOR NERVES 03/13/17  Yes Unk Pinto, MD  gabapentin (NEURONTIN) 300 MG capsule TAKE 1 CAPSULE BY MOUTH 3-4 TIMES DAILY FOR FACIAL PAIN 01/14/17  Yes Unk Pinto, MD  losartan (COZAAR) 100 MG tablet TAKE 1/2 TABLET BY MOUTH DAILY Patient taking differently: TAKE 1/2 TABLET (50mg ) BY MOUTH IN THE EVENING 07/09/16  Yes Unk Pinto, MD  pravastatin (PRAVACHOL) 40 MG tablet TAKE 1 TABLET BY MOUTH DAILY 03/09/17  Yes Vicie Mutters, PA-C  ranitidine (ZANTAC) 300 MG tablet TAKE 1 TABLET BY MOUTH TWICE DAILY AS NEEDED FOR ACID INDIGESTION ANDREFLUX 02/13/17  Yes Vicie Mutters, PA-C  traMADol (ULTRAM) 50 MG tablet TAKE 1 TABLET BY MOUTH 4 TIMES DAILY AS NEEDED FOR PAIN 02/05/17  Yes Vicie Mutters, PA-C  hyoscyamine (LEVSIN SL) 0.125 MG SL tablet Dissolve one tablet by mouth every 4-6 hours as needed Patient taking differently: Take 0.125 mg by mouth every 4 (four) hours as needed for cramping. Dissolve one tablet by mouth every 4-6 hours as needed 10/29/16   Unk Pinto, MD  pantoprazole (PROTONIX) 20 MG tablet Take 1 tablet (20 mg  total) by mouth daily. 11/10/16 12/10/16  Julianne Rice, MD    Scheduled Meds: . atenolol  50 mg Oral q morning - 10a  . famotidine  20 mg Oral Daily  . gabapentin  300 mg Oral BID  . hydrocortisone   Rectal BID  . lactobacillus  1 g Oral TID WC  . pravastatin  40 mg Oral q1800  . vancomycin  125 mg Oral QID   Infusions: . sodium chloride 75 mL/hr at 04/07/17 0618   PRN Meds: acetaminophen **OR** acetaminophen, alum & mag hydroxide-simeth, clonazePAM, oxyCODONE, traMADol   Allergies as of 04/05/2017 - Review Complete 04/05/2017  Allergen Reaction Noted  . Latex Hives 07/04/2011  . Amoxicillin Nausea And Vomiting 07/24/2012  . Aspirin  07/04/2011  . Barbiturates  05/14/2013  . Celebrex [celecoxib]  05/14/2013  . Codeine Itching 07/04/2011  . Evista [raloxifene]  05/14/2013  . Lipitor [atorvastatin]  05/14/2013  . Morphine and related  05/14/2013  . Oruvail [ketoprofen]  05/14/2013  . Pantoprazole Diarrhea 11/22/2016  . Paraffin  05/14/2013  . Prochlorperazine Other (See Comments) 09/20/2011  . Proloprim [trimethoprim]  05/14/2013  . Venlafaxine Nausea Only 11/14/2016  . Vibra-tab [doxycycline]  05/14/2013  . Vioxx [rofecoxib]  05/14/2013  . Betadine [povidone iodine] Rash 07/13/2011  . Caffeine Palpitations 07/13/2011    Family History  Problem Relation Age of Onset  . Heart disease Mother   . Heart disease Father   . Breast cancer Sister   . Colon cancer Sister   . Cancer Sister        breast  . Cancer Sister        colon  . Cancer Sister        colon  . Hypertension Daughter   . Hyperlipidemia Daughter     Social History   Social History  . Marital status: Married    Spouse name: N/A  . Number of children: 1  . Years of education: 33   Occupational History  . Retired Retired   Social History Main Topics  . Smoking status: Never Smoker  . Smokeless tobacco: Never Used  . Alcohol use No  . Drug use: No  . Sexual activity: Not on file      Comment: HYSTER   Other Topics Concern  . Not on file   Social History Narrative   [place].   Right-handed.   No caffeine use.    REVIEW OF SYSTEMS: Constitutional:  Some fatigue and weakness ENT:  No nose bleeds Pulm:  No shortness of breath or cough. CV:  No palpitations, no LE edema. No chest pain GU:  No hematuria, no frequency GI:  No nausea or vomiting. Generally she has a good appetite. No dysphagia. No heartburn Heme:  No excessive bleeding or bruising other than the GI bleeding as described above.   Transfusions:  Patient does not recall previous blood transfusions. Neuro:  No headaches, no peripheral tingling or numbness Derm:  No itching, no rash or sores.  Endocrine:  No sweats or chills.  No polyuria or dysuria Immunization:  Did not inquire as to recent or previous vaccinations. Travel:  None beyond local counties in last few months.    PHYSICAL EXAM: Vital signs in last 24 hours: Vitals:   04/07/17 2122 04/08/17 0626  BP: (!) 146/68 (!) 147/71  Pulse: 72 70  Resp: 18 20  Temp: 98.6 F (37 C) (!) 97.5 F (36.4 C)  SpO2: 98% 97%   Wt Readings from Last 3 Encounters:  04/05/17 49.3 kg (108 lb 11.2 oz)  03/27/17 49.4 kg (109 lb)  02/05/17 49.2 kg (108 lb 6.4 oz)    General: thin, elderly WF  Who is resting comfortably. Doesn't look toxic or uncomfortable Head:  No facial asymmetry or swelling. No signs of head trauma.  Eyes:  No conjunctival pallor or scleral icterus Ears:  Lightly hard of hearing  Nose:  No discharge or congestion Mouth:  Moist, pink, clear oropharynx. Good dental repair. Tongue midline. Neck:  No JVD, no masses, no thyromegaly. Lungs:  Clear bilaterally. No cough or dyspnea.   Heart: RRR.  No MRG. S1, S2 present Abdomen:  Soft. Slight left lower quadrant tenderness but no guarding or rebound.Active bowel sounds. No organomegaly, hernias, bruits..   Rectal: erythematous skin without breakdown in the  perirectal region. Rectum itself is also erythematous but there is no visible blood or lesions. Scant soft brown stool on DRE no masses, no hemorrhoids   Musc skeltate.  Mild spinal kyphosis Extremities:  No CCE.  Linear scab about 2-3 inches long on the left shin which is where she had the infection. This does not look at all infected Neurologic:  Oriented to place, date, situation, self. Able to move all 4 limbs, limb strength not tested. No tremors. No gross neurologic deficits. Skin:  No rashes or sores Tattoos:  None Nodes:  No cervical adenopathy   Psych:  Cooperative, slightly anxious  Intake/Output from previous day: 10/14 0701 - 10/15 0700 In: 2655 [P.O.:480; I.V.:1875; IV Piggyback:300] Out: -  Intake/Output this shift: Total I/O In: 410 [P.O.:410] Out: -   LAB RESULTS:  Recent Labs  04/06/17 0645 04/07/17 0516 04/08/17 0348  WBC 13.9* 9.4 8.9  HGB 11.3* 11.5* 10.5*  HCT 35.1* 35.4* 33.1*  PLT 255 280 273   BMET Lab Results  Component Value Date   NA 137 04/08/2017   NA 135 04/07/2017   NA 130 (L) 04/06/2017   K 3.2 (L) 04/08/2017   K 3.1 (L) 04/07/2017   K 3.9 04/06/2017   CL 101 04/08/2017   CL 99 (L) 04/07/2017   CL 97 (L) 04/06/2017   CO2 27 04/08/2017   CO2 26 04/07/2017   CO2 22 04/06/2017   GLUCOSE 98 04/08/2017   GLUCOSE 111 (H) 04/07/2017   GLUCOSE 123 (H) 04/06/2017   BUN <5 (L) 04/08/2017   BUN <5 (L) 04/07/2017   BUN <5 (L) 04/06/2017   CREATININE 0.61 04/08/2017   CREATININE 0.66 04/07/2017   CREATININE 0.61 04/06/2017   CALCIUM 8.4 (L) 04/08/2017   CALCIUM 8.8 (L) 04/07/2017   CALCIUM 8.7 (L) 04/06/2017   LFT  Recent Labs  04/07/17 0516  PROT 6.9  ALBUMIN 3.4*  AST 21  ALT 14  ALKPHOS 68  BILITOT 0.3   PT/INR Lab Results  Component Value Date   INR 0.97 11/22/2016   INR 0.90 03/25/2013   INR 1.13 09/03/2012   Hepatitis Panel No results for input(s): HEPBSAG, HCVAB, HEPAIGM, HEPBIGM in the last 72 hours. C-Diff No  components found for: CDIFF Lipase     Component Value Date/Time   LIPASE 26 04/05/2017 0949    Drugs of Abuse  No results found for: LABOPIA, COCAINSCRNUR, LABBENZ, AMPHETMU, THCU, LABBARB   RADIOLOGY STUDIES: No results found.    IMPRESSION:   *  Left lower quadrant abdominal pain. CT shows likely early sigmoid diverticulitis. Previous history of same and patient's symptoms consistent with prior episodes.  *  ? C diff.  Studies indeterminate.  Recently tool 7 of 10 days of KEflex.    *  Loose stools starting either soon before admission or after admission. Yesterday passe hematochezia which is improved today.Marland Kitchen    PLAN:     *  For C. Difficile, probiotic should be flora store not lactobacillus so will make the switch. Continue oral vancomycin, IV Flagyl.  Given that she likely does have sigmoid diverticulitis it may be necessary to start her back on the Cipro.  Dr Ardis Hughs to follow.   Advance diet to full liquids  Azucena Freed  04/08/2017, 11:56 AM Pager: [phone]  ________________________________________________________________________  Velora Heckler GI MD note:  I personally examined the patient, reviewed the data and agree with the assessment and plan described above.  Unclear to me if this is diverticulitis and/or c. Diff.  Stool testing was equivocal but she was on abx 1-2 weeks ago as risk factor for c. Diff. Either way, she seems to be improving on her current abx regimen and so will not make any changes for now.  Agree she's ok to advance to full liquids now  and solid food tomorrow if she's continuing to recover.   Owens Loffler, MD [facility] Pager [phone]

## 2017-04-08 NOTE — Progress Notes (Signed)
Re-education of patient and family about C-diff precautions performed.

## 2017-04-09 DIAGNOSIS — D649 Anemia, unspecified: Secondary | ICD-10-CM

## 2017-04-09 DIAGNOSIS — K5792 Diverticulitis of intestine, part unspecified, without perforation or abscess without bleeding: Secondary | ICD-10-CM

## 2017-04-09 LAB — BASIC METABOLIC PANEL
ANION GAP: 7 (ref 5–15)
CALCIUM: 8.2 mg/dL — AB (ref 8.9–10.3)
CO2: 28 mmol/L (ref 22–32)
CREATININE: 0.62 mg/dL (ref 0.44–1.00)
Chloride: 101 mmol/L (ref 101–111)
GFR calc Af Amer: >60 mL/min (ref >60)
GLUCOSE: 103 mg/dL — AB (ref 65–99)
Potassium: 3.8 mmol/L (ref 3.5–5.1)
Sodium: 136 mmol/L (ref 135–145)

## 2017-04-09 LAB — CBC
HCT: 33 % — ABNORMAL LOW (ref 36.0–46.0)
HEMOGLOBIN: 10.7 g/dL — AB (ref 12.0–15.0)
MCH: 30.1 pg (ref 26.0–34.0)
MCHC: 32.4 g/dL (ref 30.0–36.0)
MCV: 93 fL (ref 78.0–100.0)
Platelets: 286 10*3/uL (ref 150–400)
RBC: 3.55 MIL/uL — ABNORMAL LOW (ref 3.87–5.11)
RDW: 13 % (ref 11.5–15.5)
WBC: 10.2 10*3/uL (ref 4.0–10.5)

## 2017-04-09 MED ORDER — VANCOMYCIN 50 MG/ML ORAL SOLUTION: 125.0000 mg | Freq: Four times a day (QID) | ORAL | 0 refills | Status: AC

## 2017-04-09 MED ORDER — POTASSIUM CHLORIDE CRYS ER 20 MEQ PO TBCR
20.0000 meq | EXTENDED_RELEASE_TABLET | Freq: Once | ORAL | Status: AC
  Administered 2017-04-09: 20 meq via ORAL
  Filled 2017-04-09: qty 1

## 2017-04-09 MED ORDER — LOSARTAN POTASSIUM 50 MG PO TABS
50.0000 mg | ORAL_TABLET | Freq: Every day | ORAL | Status: DC
  Administered 2017-04-09: 50 mg via ORAL
  Filled 2017-04-09: qty 1

## 2017-04-09 MED ORDER — SACCHAROMYCES BOULARDII 250 MG PO CAPS: 250.0000 mg | ORAL_CAPSULE | Freq: Two times a day (BID) | ORAL | 0 refills | Status: DC

## 2017-04-09 NOTE — Progress Notes (Signed)
Daily Rounding Note  04/09/2017, 10:17 AM  LOS: 4 days   SUBJECTIVE:   Chief complaint:     Diarrhea, improved.  Stools soft, semiformed and not bloody.  Abdominal pain mild.  Seems overwhelmed by prospect of choosing foods for lunch.  OBJECTIVE:         Vital signs in last 24 hours:    Temp:  [98 F (36.7 C)-98.4 F (36.9 C)] 98.3 F (36.8 C) (10/16 0550) Pulse Rate:  [64-83] 83 (10/16 0550) Resp:  [20] 20 (10/16 0550) BP: (142-163)/(66-85) 163/82 (10/16 0550) SpO2:  [98 %-100 %] 100 % (10/16 0550) Last BM Date: 04/08/17 Filed Weights   04/05/17 2028  Weight: 49.3 kg (108 lb 11.2 oz)   General: anxious.  non0toxic   Heart: RRR Chest: clear bil.   Abdomen: soft, Minor if any left tenderness.  Active BS.  ND  Extremities: no CCE Neuro/Psych:  Anxious, oriented x 3.  Moves all 4 limbs.    Lab Results:  Recent Labs  04/07/17 0516 04/08/17 0348 04/09/17 0430  WBC 9.4 8.9 10.2  HGB 11.5* 10.5* 10.7*  HCT 35.4* 33.1* 33.0*  PLT 280 273 286   BMET  Recent Labs  04/07/17 0516 04/08/17 0348 04/09/17 0430  NA 135 137 136  K 3.1* 3.2* 3.8  CL 99* 101 101  CO2 26 27 28   GLUCOSE 111* 98 103*  BUN <5* <5* <5*  CREATININE 0.66 0.61 0.62  CALCIUM 8.8* 8.4* 8.2*   LFT  Recent Labs  04/07/17 0516  PROT 6.9  ALBUMIN 3.4*  AST 21  ALT 14  ALKPHOS 68  BILITOT 0.3   Scheduled Meds: . atenolol  50 mg Oral q morning - 10a  . famotidine  20 mg Oral Daily  . gabapentin  300 mg Oral BID  . hydrocortisone   Rectal BID  . losartan  50 mg Oral Daily  . pravastatin  40 mg Oral q1800  . saccharomyces boulardii  250 mg Oral BID  . vancomycin  125 mg Oral QID   Continuous Infusions: PRN Meds:.acetaminophen **OR** acetaminophen, alum & mag hydroxide-simeth, clonazePAM, oxyCODONE, traMADol   ASSESMENT:   *  LLQ pain, CT with sigmoid diverticulitis.  Briefly treated with cipro and flagyl x 3 days,     *  Diarrhea, recent Keflex, C Diff studies equivocal.  Oral vancomycin in place.  sxs improving on oral vanc day 4.  Florastor day  2.    *  Normocytic anemia.  Hgb dropped just under 2 grams and stable.    PLAN   *  Advance to soft diet.  Leave oral vanc in place, ? 7 vs 10 days? Rosana Fret for 1 month.       Debra White  04/09/2017, 10:17 AM Pager: [phone]   ________________________________________________________________________  Velora Heckler GI MD note:  I personally examined the patient, reviewed the data and agree with the assessment and plan described above.  She is clinically improving.  C. Diff +/- diverticulitis; brief cipro flagyl treatment and since then oral vanc (day 4 day). She is clearly improving clinically.  She will be d/c home today,  will complete another 10 days vanc orally. Follow up at [facility].   Owens Loffler, MD [facility] Pager [phone]

## 2017-04-09 NOTE — Progress Notes (Signed)
Nsg Discharge Note  Admit Date:  04/05/2017 Discharge date: 04/09/2017   Francoise Ceo to be D/C'd Home per MD order.  AVS completed.  Copy for chart, and copy for patient signed, and dated. Patient/caregiver able to verbalize understanding.  Discharge Medication: Allergies as of 04/09/2017      Reactions   Latex Hives   TONGUE HAD BLISTERS WHEN HAD DENTAL SURGERY   Amoxicillin Nausea And Vomiting   Aspirin    Gi upset   Barbiturates    Celebrex [celecoxib]    Codeine Itching   Evista [raloxifene]    Lipitor [atorvastatin]    Morphine And Related    Oruvail [ketoprofen]    Pantoprazole Diarrhea   Paraffin    Prochlorperazine Other (See Comments)   Uncontrolled shaking   Proloprim [trimethoprim]    Venlafaxine Nausea Only   Vibra-tab [doxycycline]    Vioxx [rofecoxib]    edema   Betadine [povidone Iodine] Rash   Caffeine Palpitations      Medication List    STOP taking these medications   cephALEXin 500 MG capsule Commonly known as:  KEFLEX   hyoscyamine 0.125 MG SL tablet Commonly known as:  LEVSIN SL   pantoprazole 20 MG tablet Commonly known as:  PROTONIX     TAKE these medications   acetaminophen 500 MG tablet Commonly known as:  TYLENOL Take 500-1,000 mg by mouth every 6 (six) hours as needed for headache.   atenolol 100 MG tablet Commonly known as:  TENORMIN TAKE 1 TABLET BY MOUTH EVERY MORNING What changed:  See the new instructions.   cholecalciferol 1000 units tablet Commonly known as:  VITAMIN D Take 4,000 Units by mouth daily.   clonazePAM 1 MG tablet Commonly known as:  KLONOPIN TAKE 1/2 TO 1 TABLET BY MOUTH UP TO THREE TIMES DAILY AS NEEDED FOR NERVES   gabapentin 300 MG capsule Commonly known as:  NEURONTIN TAKE 1 CAPSULE BY MOUTH 3-4 TIMES DAILY FOR FACIAL PAIN   losartan 100 MG tablet Commonly known as:  COZAAR TAKE 1/2 TABLET BY MOUTH DAILY What changed:  See the new instructions.   pravastatin 40 MG tablet Commonly known  as:  PRAVACHOL TAKE 1 TABLET BY MOUTH DAILY   ranitidine 300 MG tablet Commonly known as:  ZANTAC TAKE 1 TABLET BY MOUTH TWICE DAILY AS NEEDED FOR ACID INDIGESTION ANDREFLUX   saccharomyces boulardii 250 MG capsule Commonly known as:  FLORASTOR Take 1 capsule (250 mg total) by mouth 2 (two) times daily.   traMADol 50 MG tablet Commonly known as:  ULTRAM TAKE 1 TABLET BY MOUTH 4 TIMES DAILY AS NEEDED FOR PAIN   vancomycin 50 mg/mL oral solution Commonly known as:  VANCOCIN Take 2.5 mLs (125 mg total) by mouth 4 (four) times daily.       Discharge Assessment: Vitals:   04/08/17 2228 04/09/17 0550  BP: (!) 142/81 (!) 163/82  Pulse: 67 83  Resp: 20 20  Temp: 98.4 F (36.9 C) 98.3 F (36.8 C)  SpO2: 99% 100%   Skin clean, dry and intact without evidence of skin break down, no evidence of skin tears noted. IV catheter discontinued intact. Site without signs and symptoms of complications - no redness or edema noted at insertion site, patient denies c/o pain - only slight tenderness at site.  Dressing with slight pressure applied.  D/c Instructions-Education: Discharge instructions given to patient/family with verbalized understanding. D/c education completed with patient/family including follow up instructions, medication list, d/c activities limitations if  indicated, with other d/c instructions as indicated by MD - patient able to verbalize understanding, all questions fully answered. Patient instructed to return to ED, call 911, or call MD for any changes in condition.  Patient escorted via [place], and D/C home via private auto.  Cloretta Ned, RN 04/09/2017 6:30 PM

## 2017-04-09 NOTE — Consult Note (Signed)
[facility] CM Primary Care Navigator  04/09/2017  Debra White 06/11/1934 [id number]  Met withpatient and husband (Estel)at the bedside to identify possible discharge needs.  Patientreports having increased, severe abdominal pain, nausea but no vomiting and loose bowel movements (diarrhea) which led to this admission.  Patient endorses Dr. Unk Pinto with [facility] as herprimary care provider.   Patient shared using [facility] in [place] obtain medications without difficulty.   Patient states managing hermedications at home with use of "pill box" system filled weekly.    Patient reports that husband or sister Olegario Shearer) have been providing transportation toherdoctors'appointments.  Husband is able to provide minimal assistance at home but her siblings can be called in to assist with care if needed- since they live nearby per patient.        According to patient, sister in-law Enid Derry) will be able to do grocery shopping for them as well.   Anticipated discharge plan is home per patient.  Patient voiced understanding to call primary care provider's officewhen she returnshome, for a post discharge follow-upwithin a week or sooner if needed.Patient letter (with PCP's contact number) was provided as areminder.  Explained to patient and husbandregardingTHN CM services available for health management at home but patient denies any current needs, issues or concerns.  Patient further states that COPD (bronchitis) has not been a bother and was not put on any medication since she did not need it.  Patientverbalizedunderstanding to seekreferral to [facility] care managementfrom primary care provider ifdeemed necessary andappropriatefor services in the future.  [facility] care management information provided for future needs that she may have.  Patient however, had opted and verbally agreed  forEMMICalls tofollow up with herrecovery at home.  Referral made forEMMI General Calls to follow-up patient after discharge.   For questions, please contact:  Dannielle Huh, BSN, RN- [facility] Primary Care Navigator  Telephone: [phone] [place]

## 2017-04-09 NOTE — Progress Notes (Signed)
Notified by tech of BP 163/82.  Notified Baltazar Najjar, NP.  No new orders at this time.  Will continue to monitor patient.

## 2017-04-09 NOTE — Discharge Instructions (Signed)
Clostridium Difficile Infection Clostridium difficile (C. difficile or C. diff) infection is a condition that causes inflammation of the large intestine (colon). This condition can result in damage to the lining of your colon and may lead to colitis. This infection can be passed from person to person (is contagious). What are the causes? C. diff is a bacterium that is normally found in the colon. This infection is caused when the balance of C. diff is changed and there is an overgrowth of C. diff. This is often caused by antibiotic use. What increases the risk? This condition is more likely to develop in people who:  Take antibiotic medicines.  Take a certain type of medicine called proton pump inhibitors over a long period of time (chronic use).  Are older.  Have had a C. diff infection before.  Have serious underlying conditions, such as colon cancer.  Are in the hospital.  Have a weak defense (immune) system.  Live in a place where there is a lot of contact with others, such as a nursing home.  Have had gastrointestinal (GI) tract surgery.  What are the signs or symptoms? Symptoms of this condition include:  Diarrhea. This may be bloody, watery, or yellow or green in color.  Fever.  Fatigue.  Loss of appetite.  Nausea.  Swelling, pain, or tenderness in the abdomen.  Dehydration. Dehydration can cause you to be tired and thirsty, have a dry mouth, and urinate less frequently.  How is this diagnosed? This condition is diagnosed with a medical history and physical exam. You may also have tests, including:  A test that checks for C. diff in your stool.  Blood tests.  A sigmoidoscopy or colonoscopy to look at your colon. These procedures involve passing an instrument through your rectum to look at the inside of your colon.  How is this treated? Treatment for this condition includes:  Antibiotics that keep C. diff from growing.  Stopping the antibiotics you were  on before the C. diff infection began. Only do this as told by your health care provider.  Fluids through an IV tube, if you are dehydrated.  Surgery to remove the infected part of the colon. This is rare.  Follow these instructions at home: Eating and drinking  Drink enough fluid to keep your urine clear or pale yellow. Avoid milk, caffeine, and alcohol.  Follow specific rehydration instructions as told by your health care provider.  Eat small, frequent meals instead of large meals. Medicines  Take your antibiotic medicine as told by your health care provider. Do not stop taking the antibiotic even if you start to feel better unless your health care provider told you to do that.  Take over-the-counter and prescription medicines only as told by your health care provider.  Do not use medicines to help with diarrhea. General instructions  Wash your hands thoroughly before you prepare food and after you use the bathroom. Make sure people who live with you also wash their hands often.  Clean surfaces that you touch with a product that contains chlorine bleach.  Keep all follow-up visits as told by your health care provider. This is important. Contact a health care provider if:  Your symptoms do not get better with treatment.  Your symptoms get worse with treatment.  Your symptoms go away and then return.  You have a fever.  You have new symptoms. Get help right away if:  You have increasing pain or tenderness in your abdomen.  You have stool  that is mostly bloody, or your stool looks dark black and tarry.  You cannot eat or drink without vomiting.  You have signs of dehydration, such as: ? Dark urine, very little urine, or no urine. ? Cracked lips. ? Not making tears when you cry. ? Dry mouth. ? Sunken eyes. ? Sleepiness. ? Weakness. ? Dizziness. This information is not intended to replace advice given to you by your health care provider. Make sure you discuss any  questions you have with your health care provider. Document Released: 03/21/2005 Document Revised: 11/17/2015 Document Reviewed: 12/13/2014 Elsevier Interactive Patient Education  2017 [employer]   Diverticulitis Diverticulitis is inflammation or infection of small pouches in your colon that form when you have a condition called diverticulosis. The pouches in your colon are called diverticula. Your colon, or large intestine, is where water is absorbed and stool is formed. Complications of diverticulitis can include:  Bleeding.  Severe infection.  Severe pain.  Perforation of your colon.  Obstruction of your colon.  What are the causes? Diverticulitis is caused by bacteria. Diverticulitis happens when stool becomes trapped in diverticula. This allows bacteria to grow in the diverticula, which can lead to inflammation and infection. What increases the risk? People with diverticulosis are at risk for diverticulitis. Eating a diet that does not include enough fiber from fruits and vegetables may make diverticulitis more likely to develop. What are the signs or symptoms? Symptoms of diverticulitis may include:  Abdominal pain and tenderness. The pain is normally located on the left side of the abdomen, but may occur in other areas.  Fever and chills.  Bloating.  Cramping.  Nausea.  Vomiting.  Constipation.  Diarrhea.  Blood in your stool.  How is this diagnosed? Your health care provider will ask you about your medical history and do a physical exam. You may need to have tests done because many medical conditions can cause the same symptoms as diverticulitis. Tests may include:  Blood tests.  Urine tests.  Imaging tests of the abdomen, including X-rays and CT scans.  When your condition is under control, your health care provider may recommend that you have a colonoscopy. A colonoscopy can show how severe your diverticula are and whether something else is causing  your symptoms. How is this treated? Most cases of diverticulitis are mild and can be treated at home. Treatment may include:  Taking over-the-counter pain medicines.  Following a clear liquid diet.  Taking antibiotic medicines by mouth for 7-10 days.  More severe cases may be treated at a hospital. Treatment may include:  Not eating or drinking.  Taking prescription pain medicine.  Receiving antibiotic medicines through an IV tube.  Receiving fluids and nutrition through an IV tube.  Surgery.  Follow these instructions at home:  Follow your health care providers instructions carefully.  Follow a full liquid diet or other diet as directed by your health care provider. After your symptoms improve, your health care provider may tell you to change your diet. He or she may recommend you eat a high-fiber diet. Fruits and vegetables are good sources of fiber. Fiber makes it easier to pass stool.  Take fiber supplements or probiotics as directed by your health care provider.  Only take medicines as directed by your health care provider.  Keep all your follow-up appointments. Contact a health care provider if:  Your pain does not improve.  You have a hard time eating food.  Your bowel movements do not return to normal. Get  help right away if:  Your pain becomes worse.  Your symptoms do not get better.  Your symptoms suddenly get worse.  You have a fever.  You have repeated vomiting.  You have bloody or black, tarry stools. This information is not intended to replace advice given to you by your health care provider. Make sure you discuss any questions you have with your health care provider. Document Released: 03/21/2005 Document Revised: 11/17/2015 Document Reviewed: 05/06/2013 Elsevier Interactive Patient Education  2017 [employer].

## 2017-04-10 MED FILL — VANCOMYCIN HCL 125 MG CAP: 125 | 10 days supply | Qty: 40 | Fill #0

## 2017-04-10 NOTE — Discharge Summary (Signed)
[facility] Discharge Summary   Patient: Debra White OVZ:[id number]   PCP: Unk Pinto, MD DOB: April 07, 1934   Date of admission: 04/05/2017   Date of discharge: 04/09/2017    Discharge Diagnoses:  Principal Problem:   Diverticulitis Active Problems:   Hypertension   Hyperlipidemia   GERD   COPD (chronic obstructive pulmonary disease) with emphysema (HCC)   Osteoporosis   Hyponatremia   Leukocytosis   Diarrhea   Abdominal pain   Admitted From: home Disposition:  home  Recommendations for Outpatient Follow-up:  1. Please follow up with PCP in 1 week   Follow-up Information    Unk Pinto, MD. Schedule an appointment as soon as possible for a visit in 1 week(s).   Specialty:  Internal Medicine Contact information: [address] [place] [address] [phone]        [facility]. Schedule an appointment as soon as possible for a visit in 1 month(s).   Specialty:  Gastroenterology Contact information: [place] [address] [phone]         Diet recommendation: soft diet  Activity: The patient is advised to gradually reintroduce usual activities.  Discharge Condition: good  Code Status: DNR DNI  History of present illness: As per the H and P dictated on admission, "Debra White is a very pleasant 83 y.o. female with medical history significant for hypertension, hyperlipidemia, GERD, fibromyalgia, COPD, chronic kidney disease, anxiety, diverticulosis presents to emergency Department chief complaint abdominal pain persistent diarrhea. Initial evaluation includes CT of abdomen pelvis concerning for early diverticulitis  Information is obtained from the patient and the chart. She states about 2 weeks ago she was placed on an antibiotic for skin infection. Chart review indicates she was on Keflex. She states she had about 5 days remaining on the course and she developed rash with  itching as well as diarrhea. She stopped taking the Keflex but the diarrhea did not stop. She thereafter she developed abdominal pain originating in her right lower quadrant. She states after about 24 hours the pain was diffuse and moved over to her left lower quadrant. She reports decreased oral intake. She denies nausea and vomiting and doubts she had a fever. She denies chest pain palpitation shortness of breath diaphoresis. She denies headache dizziness syncope or near-syncope. She denies dysuria hematuria frequency or urgency. She denies melena bright red blood per rectum."  Hospital Course:  Summary of her active problems in the hospital is as following.  1. C. difficile colitis. Present on admission. CT scan abdomen shows early acute diverticulitis. Patient started on IV Cipro and Flagyl on admission. Leukocytosis better. C. difficile antigen positive, toxin negative, toxigenic C. difficile PCR positive.based on criteria set up by ID this patient should be treated for C. Difficile. Has some hematochezia. Continue oral vancomycin.  2. Hematochezia. SCD for DVT prophylaxis. Hemoglobin since remained stable will monitor for now Transfuse for hemoglobin less than 7. GI was consulted due to patient passing clots with abdominal pain. Currently recommends no further intervention  3. Essential hypertension. Continuing atenolol per home regimen.  4. Hyponatremia. Sodium getting better with IV hydration.  All other chronic medical condition were stable during the hospitalization.  Patient was ambulatory without any assistance.  On the day of the discharge the patient's vitals were stable, and no other acute medical condition were reported by patient. the patient was felt safe to be discharge at home with family.  Procedures and Results:  none  Consultations:  Gastroenterology   DISCHARGE MEDICATION: Discharge Medication List as of 04/09/2017  5:24 PM    START taking these  medications   Details  saccharomyces boulardii (FLORASTOR) 250 MG capsule Take 1 capsule (250 mg total) by mouth 2 (two) times daily., Starting Tue 04/09/2017, Normal    vancomycin (VANCOCIN) 50 mg/mL oral solution Take 2.5 mLs (125 mg total) by mouth 4 (four) times daily., Starting Tue 04/09/2017, Until Fri 04/19/2017, Print      CONTINUE these medications which have NOT CHANGED   Details  acetaminophen (TYLENOL) 500 MG tablet Take 500-1,000 mg by mouth every 6 (six) hours as needed for headache., Historical Med    atenolol (TENORMIN) 100 MG tablet TAKE 1 TABLET BY MOUTH EVERY MORNING, Normal    cholecalciferol (VITAMIN D) 1000 UNITS tablet Take 4,000 Units by mouth daily. , Historical Med    clonazePAM (KLONOPIN) 1 MG tablet TAKE 1/2 TO 1 TABLET BY MOUTH UP TO THREE TIMES DAILY AS NEEDED FOR NERVES, Print    gabapentin (NEURONTIN) 300 MG capsule TAKE 1 CAPSULE BY MOUTH 3-4 TIMES DAILY FOR FACIAL PAIN, Normal    losartan (COZAAR) 100 MG tablet TAKE 1/2 TABLET BY MOUTH DAILY, Normal    pravastatin (PRAVACHOL) 40 MG tablet TAKE 1 TABLET BY MOUTH DAILY, Normal    ranitidine (ZANTAC) 300 MG tablet TAKE 1 TABLET BY MOUTH TWICE DAILY AS NEEDED FOR ACID INDIGESTION ANDREFLUX, Normal    traMADol (ULTRAM) 50 MG tablet TAKE 1 TABLET BY MOUTH 4 TIMES DAILY AS NEEDED FOR PAIN, Phone In      STOP taking these medications     cephALEXin (KEFLEX) 500 MG capsule      hyoscyamine (LEVSIN SL) 0.125 MG SL tablet      pantoprazole (PROTONIX) 20 MG tablet        Allergies  Allergen Reactions  . Latex Hives    TONGUE HAD BLISTERS WHEN HAD DENTAL SURGERY  . Amoxicillin Nausea And Vomiting  . Aspirin     Gi upset  . Barbiturates   . Celebrex [Celecoxib]   . Codeine Itching  . Evista [Raloxifene]   . Lipitor [Atorvastatin]   . Morphine And Related   . Oruvail [Ketoprofen]   . Pantoprazole Diarrhea  . Paraffin   . Prochlorperazine Other (See Comments)    Uncontrolled shaking  .  Proloprim [Trimethoprim]   . Venlafaxine Nausea Only  . Vibra-Tab [Doxycycline]   . Vioxx [Rofecoxib]     edema  . Betadine [Povidone Iodine] Rash  . Caffeine Palpitations   Discharge Instructions    DIET SOFT    Complete by:  As directed    Discharge instructions    Complete by:  As directed    It is important that you read following instructions as well as go over your medication list with RN to help you understand your care after this hospitalization.  Discharge Instructions: Please follow-up with PCP in one week  Please request your primary care physician to go over all Hospital Tests and Procedure/Radiological results at the follow up,  Please get all Hospital records sent to your PCP by signing hospital release before you go home.   Do not take more than prescribed Pain, Sleep and Anxiety Medications. You were cared for by a hospitalist during your hospital stay. If you have any questions about your discharge medications or the care you received while you were in the hospital after you are discharged, you can call the unit and ask to speak  with the hospitalist on call if the hospitalist that took care of you is not available.  Once you are discharged, your primary care physician will handle any further medical issues. Please note that NO REFILLS for any discharge medications will be authorized once you are discharged, as it is imperative that you return to your primary care physician (or establish a relationship with a primary care physician if you do not have one) for your aftercare needs so that they can reassess your need for medications and monitor your lab values. You Must read complete instructions/literature along with all the possible adverse reactions/side effects for all the Medicines you take and that have been prescribed to you. Take any new Medicines after you have completely understood and accept all the possible adverse reactions/side effects. Wear Seat belts while  driving. If you have smoked or chewed Tobacco in the last 2 yrs please stop smoking and/or stop any Recreational drug use.   Face-to-face encounter (required for Medicare/Medicaid patients)    Complete by:  As directed    I Guiseppe Flanagan certify that this patient is under my care and that I, or a nurse practitioner or physician's assistant working with me, had a face-to-face encounter that meets the physician face-to-face encounter requirements with this patient on 04/09/2017. The encounter with the patient was in whole, or in part for the following medical condition(s) which is the primary reason for home health care (List medical condition): colitis, diverticulitis, GI bleed   The encounter with the patient was in whole, or in part, for the following medical condition, which is the primary reason for home health care:  yes   I certify that, based on my findings, the following services are medically necessary home health services:  Physical therapy   Reason for Medically Necessary Home Health Services:  Therapy- Personnel officer, Public librarian   My clinical findings support the need for the above services:  Unsafe ambulation due to balance issues   Further, I certify that my clinical findings support that this patient is homebound due to:  Unsafe ambulation due to balance issues   Home Health    Complete by:  As directed    To provide the following care/treatments:  PT   Increase activity slowly    Complete by:  As directed      Discharge Exam: Filed Weights   04/05/17 2028  Weight: 49.3 kg (108 lb 11.2 oz)   Vitals:   04/08/17 2228 04/09/17 0550  BP: (!) 142/81 (!) 163/82  Pulse: 67 83  Resp: 20 20  Temp: 98.4 F (36.9 C) 98.3 F (36.8 C)  SpO2: 99% 100%   General: Appear in no distress, no Rash; Oral Mucosa moist. Cardiovascular: S1 and S2 Present, no Murmur, no JVD Respiratory: Bilateral Air entry present and Clear to Auscultation, no Crackles, no  wheezes Abdomen: Bowel Sound present, Soft and no tenderness Extremities: no Pedal edema, no calf tenderness Neurology: Grossly no focal neuro deficit.  The results of significant diagnostics from this hospitalization (including imaging, microbiology, ancillary and laboratory) are listed below for reference.    Significant Diagnostic Studies: Dg Tibia/fibula Left  Result Date: 03/29/2017 CLINICAL DATA:  Recent trauma with on going pain abrasion to the anterior mid tibia and fibula EXAM: LEFT TIBIA AND FIBULA - 2 VIEW COMPARISON:  None. FINDINGS: There is no evidence of fracture or other focal bone lesions. Soft tissue swelling anterior to the mid tibia. No radiopaque foreign body. IMPRESSION: No  acute osseous abnormality Electronically Signed   By: Donavan Foil M.D.   On: 03/29/2017 16:14   Ct Abdomen Pelvis W Contrast  Result Date: 04/05/2017 CLINICAL DATA:  Abdominal pain, diverticulitis suspected EXAM: CT ABDOMEN AND PELVIS WITH CONTRAST TECHNIQUE: Multidetector CT imaging of the abdomen and pelvis was performed using the standard protocol following bolus administration of intravenous contrast. CONTRAST:  181mL ISOVUE-300 IOPAMIDOL (ISOVUE-300) INJECTION 61% COMPARISON:  04/13/2015 FINDINGS: Lower chest: Heart is enlarged.  Lung bases clear.  No effusions. Hepatobiliary: No focal hepatic abnormality. Gallbladder unremarkable. Pancreas: No focal abnormality or ductal dilatation. Spleen: No focal abnormality.  Normal size. Adrenals/Urinary Tract: No adrenal abnormality. No focal renal abnormality. No stones or hydronephrosis. Urinary bladder is unremarkable. Stomach/Bowel: Sigmoid diverticulosis. Indistinctness/haziness around the sigmoid colon compatible with early active diverticulitis. Stomach and small bowel decompressed, unremarkable. Vascular/Lymphatic: Aortic and iliac calcifications diffusely. No aneurysm or adenopathy. Reproductive: Prior hysterectomy.  No adnexal masses. Other: No free  fluid or free air. Musculoskeletal: No acute bony abnormality. Postoperative changes in the lower lumbar spine. IMPRESSION: Sigmoid diverticulosis with changes of early active diverticulitis. Aortoiliac atherosclerosis. Electronically Signed   By: Rolm Baptise M.D.   On: 04/05/2017 11:20   Xr C-arm No Report  Result Date: 04/03/2017 Please see Notes or Procedures tab for imaging impression.   Microbiology: Recent Results (from the past 240 hour(s))  C difficile quick scan w PCR reflex     Status: Abnormal   Collection Time: 04/06/17  2:42 AM  Result Value Ref Range Status   C Diff antigen POSITIVE (A) NEGATIVE Final   C Diff toxin NEGATIVE NEGATIVE Final   C Diff interpretation Results are indeterminate. See PCR results.  Final  Gastrointestinal Panel by PCR , Stool     Status: None   Collection Time: 04/06/17  2:42 AM  Result Value Ref Range Status   Campylobacter species NOT DETECTED NOT DETECTED Final   Plesimonas shigelloides NOT DETECTED NOT DETECTED Final   Salmonella species NOT DETECTED NOT DETECTED Final   Yersinia enterocolitica NOT DETECTED NOT DETECTED Final   Vibrio species NOT DETECTED NOT DETECTED Final   Vibrio cholerae NOT DETECTED NOT DETECTED Final   Enteroaggregative E coli (EAEC) NOT DETECTED NOT DETECTED Final   Enteropathogenic E coli (EPEC) NOT DETECTED NOT DETECTED Final   Enterotoxigenic E coli (ETEC) NOT DETECTED NOT DETECTED Final   Shiga like toxin producing E coli (STEC) NOT DETECTED NOT DETECTED Final   Shigella/Enteroinvasive E coli (EIEC) NOT DETECTED NOT DETECTED Final   Cryptosporidium NOT DETECTED NOT DETECTED Final   Cyclospora cayetanensis NOT DETECTED NOT DETECTED Final   Entamoeba histolytica NOT DETECTED NOT DETECTED Final   Giardia lamblia NOT DETECTED NOT DETECTED Final   Adenovirus F40/41 NOT DETECTED NOT DETECTED Final   Astrovirus NOT DETECTED NOT DETECTED Final   Norovirus GI/GII NOT DETECTED NOT DETECTED Final   Rotavirus A NOT  DETECTED NOT DETECTED Final   Sapovirus (I, II, IV, and V) NOT DETECTED NOT DETECTED Final  Clostridium Difficile by PCR     Status: Abnormal   Collection Time: 04/06/17  2:42 AM  Result Value Ref Range Status   Toxigenic C Difficile by pcr POSITIVE (A) NEGATIVE Final    Comment: Positive for toxigenic C. difficile with little to no toxin production. Only treat if clinical presentation suggests symptomatic illness.     Labs: CBC:  Recent Labs Lab 04/05/17 0949 04/06/17 0645 04/07/17 0516 04/08/17 0348 04/09/17 0430  WBC 20.4* 13.9* 9.4  8.9 10.2  NEUTROABS 17.0*  --  7.1  --   --   HGB 12.5 11.3* 11.5* 10.5* 10.7*  HCT 37.9 35.1* 35.4* 33.1* 33.0*  MCV 92.4 93.4 92.7 93.8 93.0  PLT 275 255 280 273 993   Basic Metabolic Panel:  Recent Labs Lab 04/05/17 0949 04/06/17 0645 04/07/17 0516 04/08/17 0348 04/09/17 0430  NA 127* 130* 135 137 136  K 4.9 3.9 3.1* 3.2* 3.8  CL 91* 97* 99* 101 101  CO2 26 22 26 27 28   GLUCOSE 113* 123* 111* 98 103*  BUN 9 <5* <5* <5* <5*  CREATININE 0.77 0.61 0.66 0.61 0.62  CALCIUM 9.3 8.7* 8.8* 8.4* 8.2*  MG  --   --  1.5* 1.5*  --    Liver Function Tests:  Recent Labs Lab 04/05/17 0949 04/07/17 0516  AST 24 21  ALT 15 14  ALKPHOS 79 68  BILITOT 0.7 0.3  PROT 7.5 6.9  ALBUMIN 4.0 3.4*    Recent Labs Lab 04/05/17 0949  LIPASE 26   Time spent: 35 minutes  Signed:  Idy Rawling  [facility] 04/09/2017 , 4:05 PM

## 2017-04-15 ENCOUNTER — Other Ambulatory Visit: Payer: Self-pay

## 2017-04-15 NOTE — Patient Outreach (Signed)
[facility]) Care Management  04/15/2017  Debra White [birth year] [id number]   EMMI: General Discharge Referral Date:04/15/17 Referral Reason:Other questions/problems yes Sad/hopeless/anxious/empty-yes Day # 4   Outreach attempt # 1 Spoke with patient.  She is able to verify HIPAA.  She reports that she is still feeling weak but states she lost blood but not enough to receive a blood transfusion.  Patient reports she has minimal support around the house.  She reports that her daughter is in [place] but has not come to see about her. She says that her husband is not in a position to help her much.  She states she does have some support from her brother and sister in law with meals and things but they are limited. Discussed with patient home health services she asks if the insurance will cover it I told her she would have a co-pay for each visit. She states they really cannot afford that.  Asked her about hiring someone to help around the house she states they really don't have the money for that either. Advised patient not to push herself too much as she needs to rest.  Patient verbalized understanding but states that there is laundry and housekeeping to be done.  Again advised patient to rest and not do too much as she may end up back in the hospital. She verbalized understanding.  Asked patient about feeling sad/hopeless/anxious/empty.  She states that she feels bad because she feels weak and cannot do what she needs to do.  Advised patient that is takes time to recover and make sure she gets rest. She verbalized understanding.    Patient has follow up appointment on Wednesday with her primary care doctor and follow up with GI doctor on Thursday.  Advised patient to discuss with her primary care doctor about how she is feeling on her visit but if she feels worse calling the office before then. She verbalized understanding.     Advised patient that they would continue to get  automated EMMI-GENERAL post discharge calls to assess how they are doing following recent hospitalization and will receive a call from a nurse if any of their responses were abnormal. Patient voiced understanding and was appreciative of f/u call.   Plan: RN CM will close case at this time but send letter and brochure for future reference.   RN CM will notify care management assistant of case status.   Jone Baseman, RN, MSN [facility] Care Management Care Management Coordinator Direct Line [phone] Toll Free: [phone]  Fax: [phone]

## 2017-04-16 ENCOUNTER — Telehealth: Payer: Self-pay | Admitting: *Deleted

## 2017-04-16 NOTE — Telephone Encounter (Signed)
Patient called and asked if she can return to her pre hospital schedule for her Gabapentin.  Per Dr [name], it is OK to take up to 3 Gabapentin 300 mg at bedtime.  Patient is aware.

## 2017-04-17 ENCOUNTER — Other Ambulatory Visit: Payer: Self-pay | Admitting: Internal Medicine

## 2017-04-17 ENCOUNTER — Ambulatory Visit (INDEPENDENT_AMBULATORY_CARE_PROVIDER_SITE_OTHER): Payer: PPO | Admitting: Internal Medicine

## 2017-04-17 VITALS — BP 112/70 | HR 64 | Temp 97.5°F | Resp 16 | Ht 63.0 in | Wt 109.8 lb

## 2017-04-17 DIAGNOSIS — E871 Hypo-osmolality and hyponatremia: Secondary | ICD-10-CM | POA: Diagnosis not present

## 2017-04-17 DIAGNOSIS — A0472 Enterocolitis due to Clostridium difficile, not specified as recurrent: Secondary | ICD-10-CM | POA: Diagnosis not present

## 2017-04-17 DIAGNOSIS — R1084 Generalized abdominal pain: Secondary | ICD-10-CM

## 2017-04-17 DIAGNOSIS — D72829 Elevated white blood cell count, unspecified: Secondary | ICD-10-CM | POA: Diagnosis not present

## 2017-04-17 DIAGNOSIS — K5792 Diverticulitis of intestine, part unspecified, without perforation or abscess without bleeding: Secondary | ICD-10-CM | POA: Diagnosis not present

## 2017-04-17 DIAGNOSIS — Z79899 Other long term (current) drug therapy: Secondary | ICD-10-CM

## 2017-04-17 DIAGNOSIS — A09 Infectious gastroenteritis and colitis, unspecified: Secondary | ICD-10-CM

## 2017-04-17 LAB — BASIC METABOLIC PANEL WITH GFR
BUN: 10 mg/dL (ref 7–25)
CO2: 31 mmol/L (ref 20–32)
Calcium: 9.2 mg/dL (ref 8.6–10.4)
Chloride: 88 mmol/L — ABNORMAL LOW (ref 98–110)
Creat: 0.61 mg/dL (ref 0.60–0.88)
GFR, EST AFRICAN AMERICAN: 99 mL/min/{1.73_m2} (ref >=60)
GFR, EST NON AFRICAN AMERICAN: 85 mL/min/{1.73_m2} (ref >=60)
GLUCOSE: 99 mg/dL (ref 65–99)
Potassium: 4.4 mmol/L (ref 3.5–5.3)
SODIUM: 126 mmol/L — AB (ref 135–146)

## 2017-04-17 LAB — CBC WITH DIFFERENTIAL/PLATELET
BASOS ABS: 71 {cells}/uL (ref 0–200)
Basophils Relative: 0.7 %
EOS ABS: 162 {cells}/uL (ref 15–500)
Eosinophils Relative: 1.6 %
HEMATOCRIT: 36.8 % (ref 35.0–45.0)
HEMOGLOBIN: 12.7 g/dL (ref 11.7–15.5)
Lymphs Abs: 1616 cells/uL (ref 850–3900)
MCH: 30.9 pg (ref 27.0–33.0)
MCHC: 34.5 g/dL (ref 32.0–36.0)
MCV: 89.5 fL (ref 80.0–100.0)
MONOS PCT: 11.4 %
MPV: 10.1 fL (ref 7.5–12.5)
NEUTROS ABS: 7100 {cells}/uL (ref 1500–7800)
Neutrophils Relative %: 70.3 %
Platelets: 290 10*3/uL (ref 140–400)
RBC: 4.11 10*6/uL (ref 3.80–5.10)
RDW: 12.7 % (ref 11.0–15.0)
Total Lymphocyte: 16 %
WBC: 10.1 10*3/uL (ref 3.8–10.8)
WBCMIX: 1151 {cells}/uL — AB (ref 200–950)

## 2017-04-17 LAB — MAGNESIUM: Magnesium: 1.7 mg/dL (ref 1.5–2.5)

## 2017-04-17 NOTE — Patient Instructions (Signed)
Clostridium Difficile Infection  Clostridium difficile (C. difficile or C. diff) infection is a condition that causes inflammation of the large intestine (colon). This condition can result in damage to the lining of your colon and may lead to colitis. This infection can be passed from person to person (is contagious).  What are the causes?  C. diff is a bacterium that is normally found in the colon. This infection is caused when the balance of C. diff is changed and there is an overgrowth of C. diff. This is often caused by antibiotic use.  What increases the risk?  This condition is more likely to develop in people who:  · Take antibiotic medicines.  · Take a certain type of medicine called proton pump inhibitors over a long period of time (chronic use).  · Are older.  · Have had a C. diff infection before.  · Have serious underlying conditions, such as colon cancer.  · Are in the hospital.  · Have a weak defense (immune) system.  · Live in a place where there is a lot of contact with others, such as a nursing home.  · Have had gastrointestinal (GI) tract surgery.    What are the signs or symptoms?  Symptoms of this condition include:  · Diarrhea. This may be bloody, watery, or yellow or green in color.  · Fever.  · Fatigue.  · Loss of appetite.  · Nausea.  · Swelling, pain, or tenderness in the abdomen.  · Dehydration. Dehydration can cause you to be tired and thirsty, have a dry mouth, and urinate less frequently.    How is this diagnosed?  This condition is diagnosed with a medical history and physical exam. You may also have tests, including:  · A test that checks for C. diff in your stool.  · Blood tests.  · A sigmoidoscopy or colonoscopy to look at your colon. These procedures involve passing an instrument through your rectum to look at the inside of your colon.    How is this treated?  Treatment for this condition includes:  · Antibiotics that keep C. diff from growing.  · Stopping the antibiotics you were  on before the C. diff infection began. Only do this as told by your health care provider.  · Fluids through an IV tube, if you are dehydrated.  · Surgery to remove the infected part of the colon. This is rare.    Follow these instructions at home:  Eating and drinking  · Drink enough fluid to keep your urine clear or pale yellow. Avoid milk, caffeine, and alcohol.  · Follow specific rehydration instructions as told by your health care provider.  · Eat small, frequent meals instead of large meals.  Medicines  · Take your antibiotic medicine as told by your health care provider. Do not stop taking the antibiotic even if you start to feel better unless your health care provider told you to do that.  · Take over-the-counter and prescription medicines only as told by your health care provider.  · Do not use medicines to help with diarrhea.  General instructions  · Wash your hands thoroughly before you prepare food and after you use the bathroom. Make sure people who live with you also wash their hands often.  · Clean surfaces that you touch with a product that contains chlorine bleach.  · Keep all follow-up visits as told by your health care provider. This is important.  Contact a health care provider if:  ·   Your symptoms do not get better with treatment.  · Your symptoms get worse with treatment.  · Your symptoms go away and then return.  · You have a fever.  · You have new symptoms.  Get help right away if:  · You have increasing pain or tenderness in your abdomen.  · You have stool that is mostly bloody, or your stool looks dark black and tarry.  · You cannot eat or drink without vomiting.  · You have signs of dehydration, such as:  ? Dark urine, very little urine, or no urine.  ? Cracked lips.  ? Not making tears when you cry.  ? Dry mouth.  ? Sunken eyes.  ? Sleepiness.  ? Weakness.  ? Dizziness.  This information is not intended to replace advice given to you by your health care provider. Make sure you discuss any  questions you have with your health care provider.  Document Released: 03/21/2005 Document Revised: 11/17/2015 Document Reviewed: 12/13/2014  Elsevier Interactive Patient Education © 2017 [employer] Inc.

## 2017-04-18 ENCOUNTER — Ambulatory Visit: Payer: PPO | Admitting: Physician Assistant

## 2017-04-20 ENCOUNTER — Encounter: Payer: Self-pay | Admitting: Internal Medicine

## 2017-04-20 NOTE — Progress Notes (Signed)
[place]     This very nice 83 y.o. MWF was admitted 10/12-16/2018 with Diverticulitis treated with Cipro/Flagyl . She presented with Diarrhea, Abdominal pain, leukocytosis, Dehydration and  Hyponatremia. Prior to hospitalization she had been treated with Keflex for a skin infection and she did culture (+) for C.Difficile treated with IV , then oral Vancomycin. She presents now for post hospital follow up improved w/o diarrhea, fever, nausea or abdominal pain. Patient did have hematochezia at presentation. Patient was contacted post discharge by office staff to assure stability and schedule f/u.      Hospitalization discharge instructions and medications are reconciled with the patient.      Patient is also followed with Hypertension, Hyperlipidemia, Pre-Diabetes and Vitamin D Deficiency.      Patient is treated for HTN & BP has been controlled at home. Today's BP is at goal - 112/70. Patient has had no complaints of any cardiac type chest pain, palpitations, dyspnea/orthopnea/PND, dizziness, claudication, or dependent edema.     Hyperlipidemia is controlled with diet & meds. Patient denies myalgias or other med SE's. Last Lipids were at goal: Lab Results  Component Value Date   CHOL 193 02/05/2017   HDL 85 02/05/2017   LDLCALC 77 02/05/2017   TRIG 153 (H) 02/05/2017   CHOLHDL 2.3 02/05/2017      Also, the patient has history of PreDiabetes and has had no symptoms of reactive hypoglycemia, diabetic polys, paresthesias or visual blurring.  Last A1c was at goal: Lab Results  Component Value Date   HGBA1C 5.2 10/29/2016      Further, the patient also has history of Vitamin D Deficiency and supplements vitamin D without any suspected side-effects. Last vitamin D was at goal: Lab Results  Component Value Date   VD25OH 76 10/29/2016   Current Outpatient Prescriptions on File Prior to Visit  Medication Sig  . acetaminophen (TYLENOL) 500 MG tablet Take 500-1,000 mg by mouth  every 6 (six) hours as needed for headache.  Marland Kitchen atenolol (TENORMIN) 100 MG tablet TAKE 1 TABLET BY MOUTH EVERY MORNING (Patient taking differently: TAKE 1/2 TABLET BY MOUTH EVERY MORNING)  . cholecalciferol (VITAMIN D) 1000 UNITS tablet Take 4,000 Units by mouth daily.   . clonazePAM (KLONOPIN) 1 MG tablet TAKE 1/2 TO 1 TABLET BY MOUTH UP TO THREE TIMES DAILY AS NEEDED FOR NERVES  . losartan (COZAAR) 100 MG tablet TAKE 1/2 TABLET BY MOUTH DAILY (Patient taking differently: TAKE 1/2 TABLET (50mg ) BY MOUTH IN THE EVENING)  . pravastatin (PRAVACHOL) 40 MG tablet TAKE 1 TABLET BY MOUTH DAILY  . ranitidine (ZANTAC) 300 MG tablet TAKE 1 TABLET BY MOUTH TWICE DAILY AS NEEDED FOR ACID INDIGESTION ANDREFLUX  . saccharomyces boulardii (FLORASTOR) 250 MG capsule Take 1 capsule (250 mg total) by mouth 2 (two) times daily.  . traMADol (ULTRAM) 50 MG tablet TAKE 1 TABLET BY MOUTH 4 TIMES DAILY AS NEEDED FOR PAIN  . [DISCONTINUED] omeprazole (PRILOSEC OTC) 20 MG tablet Take 20 mg by mouth daily.   No current facility-administered medications on file prior to visit.    Allergies  Allergen Reactions  . Latex Hives    TONGUE HAD BLISTERS WHEN HAD DENTAL SURGERY  . Amoxicillin Nausea And Vomiting  . Aspirin     Gi upset  . Barbiturates   . Celebrex [Celecoxib]   . Codeine Itching  . Evista [Raloxifene]   . Lipitor [Atorvastatin]   . Morphine And Related   . Oruvail [Ketoprofen]   .  Pantoprazole Diarrhea  . Paraffin   . Prochlorperazine Other (See Comments)    Uncontrolled shaking  . Proloprim [Trimethoprim]   . Venlafaxine Nausea Only  . Vibra-Tab [Doxycycline]   . Vioxx [Rofecoxib]     edema  . Betadine [Povidone Iodine] Rash  . Caffeine Palpitations   PMHx:   Past Medical History:  Diagnosis Date  . Anxiety   . Arthritis   . Blood transfusion   . Candida esophagitis ([place]) 2013   EGD  . Cataract   . Chronic kidney disease    STONES  . Diverticulosis of colon (without mention of  hemorrhage) 2007   Colonoscopy  . Dysrhythmia    RX  . Emphysema of lung ([place])   . Family history of colon cancer    sister  . Fibromyalgia   . GERD (gastroesophageal reflux disease)   . Headache(784.0)   . Hyperlipemia   . Hypertension   . Kidney stones   . Ptosis, bilateral    Immunization History  Administered Date(s) Administered  . Influenza, High Dose Seasonal PF 03/25/2014, 04/05/2016  . Influenza-Unspecified 03/09/2013, 03/11/2015  . Pneumococcal Conjugate-13 12/21/2015  . Pneumococcal Polysaccharide-23 06/25/2002  . Td 06/26/2003   Past Surgical History:  Procedure Laterality Date  . ABDOMINAL HYSTERECTOMY    . BACK SURGERY     X2  . BACK SURGERY    . CERVICAL DISC SURGERY    . COLONOSCOPY    . FACIAL FRACTURE SURGERY    . HAND SURGERY     BIL   . ORIF TRIPOD FRACTURE  07/13/2011   Procedure: OPEN REDUCTION INTERNAL FIXATION (ORIF) TRIPOD FRACTURE;  Surgeon: Tyson Alias, MD;  Location: [place];  Service: ENT;  Laterality: Right;  ORIF RIGHT ZYGOMA, ORBITAL FLOOR EXPLORATION WITH FROST STITCH (TEMPORARY TARSORRHAPHY)  . PTOSIS REPAIR Bilateral 11/22/2016   Procedure: INTERNAL PTOSIS REPAIR;  Surgeon: Clista Bernhardt, MD;  Location: [place];  Service: Ophthalmology;  Laterality: Bilateral;  . SHOULDER ARTHROSCOPY W/ ROTATOR CUFF REPAIR     LFT   FHx:    Reviewed / unchanged  SHx:    Reviewed / unchanged  Systems Review:  Constitutional: Denies fever, chills, wt changes, headaches, insomnia, fatigue, night sweats, change in appetite. Eyes: Denies redness, blurred vision, diplopia, discharge, itchy, watery eyes.  ENT: Denies discharge, congestion, post nasal drip, epistaxis, sore throat, earache, hearing loss, dental pain, tinnitus, vertigo, sinus pain, snoring.  CV: Denies chest pain, palpitations, irregular heartbeat, syncope, dyspnea, diaphoresis, orthopnea, PND, claudication or edema. Respiratory: denies cough, dyspnea, DOE, pleurisy, hoarseness,  laryngitis, wheezing.  Gastrointestinal: Denies dysphagia, odynophagia, heartburn, reflux, water brash, abdominal pain or cramps, nausea, vomiting, bloating, diarrhea, constipation, hematemesis, melena, hematochezia  or hemorrhoids. Genitourinary: Denies dysuria, frequency, urgency, nocturia, hesitancy, discharge, hematuria or flank pain. Musculoskeletal: Denies arthralgias, myalgias, stiffness, jt. swelling, pain, limping or strain/sprain.  Skin: Denies pruritus, rash, hives, warts, acne, eczema or change in skin lesion(s). Neuro: No weakness, tremor, incoordination, spasms, paresthesia or pain. Psychiatric: Denies confusion, memory loss or sensory loss. Endo: Denies change in weight, skin or hair change.  Heme/Lymph: No excessive bleeding, bruising or enlarged lymph nodes.  Physical Exam  BP 112/70   Pulse 64   Temp (!) 97.5 F (36.4 C)   Resp 16   Ht 5\' 3"  (1.6 m)   Wt 109 lb 12.8 oz (49.8 kg)   BMI 19.45 kg/m   Appears well nourished, well groomed  and in no distress.  Eyes: PERRLA, EOMs, conjunctiva no  swelling or erythema. Sinuses: No frontal/maxillary tenderness ENT/Mouth: EAC's clear, TM's nl w/o erythema, bulging. Nares clear w/o erythema, swelling, exudates. Oropharynx clear without erythema or exudates. Oral hygiene is good. Tongue normal, non obstructing. Hearing intact.  Neck: Supple. Thyroid nl. Car 2+/2+ without bruits, nodes or JVD. Chest: Respirations nl with BS clear & equal w/o rales, rhonchi, wheezing or stridor.  Cor: Heart sounds normal w/ regular rate and rhythm without sig. murmurs, gallops, clicks or rubs. Peripheral pulses normal and equal  without edema.  Abdomen: Soft & bowel sounds normal. Non-tender w/o guarding, rebound, hernias, masses or organomegaly.  Lymphatics: Unremarkable.  Musculoskeletal: Full ROM all peripheral extremities, joint stability, 5/5 strength and normal gait.  Skin: Warm, dry without exposed rashes, lesions or ecchymosis apparent.    Neuro: Cranial nerves intact, reflexes equal bilaterally. Sensory-motor testing grossly intact. Tendon reflexes grossly intact.  Pysch: Alert & oriented x 3.  Insight and judgement nl & appropriate. No ideations.  Assessment and Plan:   1. Diverticulitis - resolved  - CBC with Differential/Platelet  2. C. difficile colitis - treated  - CBC with Differential/Platelet  3. Hyponatremia - improved  - BASIC METABOLIC PANEL WITH GFR  4. Leukocytosis  - CBC with Differential/Platelet  5. Diarrhea of infectious origin   6. Generalized abdominal pain  - CBC with Differential/Platelet  7. Hypomagnesemia  - Magnesium  8. Hematochezia  - encouraged GI f/u in about a month to allow recovery from her current debility  9. Medication management  - CBC with Differential/Platelet - BASIC METABOLIC PANEL WITH GFR - Magnesium      Discussed  regular exercise, BP monitoring, weight control to achieve/maintain BMI less than 25 and discussed med and SE's. Recommended labs to assess and monitor clinical status with further disposition pending results of labs. Over 30 minutes of exam, counseling, chart review was performed.

## 2017-05-02 ENCOUNTER — Telehealth: Payer: Self-pay | Admitting: *Deleted

## 2017-05-02 NOTE — Telephone Encounter (Signed)
Patient called and reported er Bp at 111/66 yesterday PM and 113/62 later in the afternoon. She states she feels a little weak.  Per Dr [name], continue Losartan 50 mg in the PM and take Atenolol 100 mg 1/2 tablet in the AM and 1/2 in the PM.  Patient is aware.

## 2017-05-08 NOTE — Patient Instructions (Signed)

## 2017-05-08 NOTE — Progress Notes (Signed)
This very nice 83 y.o. MWF presents for 6 month follow up with Hypertension, Hyperlipidemia, Pre-Diabetes and Vitamin D Deficiency. Patient's GERD is controlled w/prudent diet and Ranitidine.  She also has chronic Anxiety.     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      Patient is treated for HTN (1990) & BP has been controlled at home. Today's BP is at goal -  110/62. Patient has had no complaints of any cardiac type chest pain, palpitations, dyspnea / orthopnea / PND, dizziness, claudication, or dependent edema.     Hyperlipidemia is controlled with diet & meds. Patient denies myalgias or other med SE's. Last Lipids were at goal: Lab Results  Component Value Date   CHOL 193 02/05/2017   HDL 85 02/05/2017   LDLCALC 77 02/05/2017   TRIG 153 (H) 02/05/2017   CHOLHDL 2.3 02/05/2017      Also, the patient has history of PreDiabetes with A1c 6.8% in 2001 and then losin 30# after her facial Fx A1c dropped to 5.8% in late 2013 and then 5.3% by 2016) and she  has had no symptoms of reactive hypoglycemia, diabetic polys, paresthesias or visual blurring.  Last A1c was normal and at goal: Lab Results  Component Value Date   HGBA1C 5.2 10/29/2016      Further, the patient also has history of Vitamin D Deficiency and supplements vitamin D without any suspected side-effects. Last vitamin D was at goal:  Lab Results  Component Value Date   VD25OH 76 10/29/2016   Current Outpatient Medications on File Prior to Visit  Medication Sig  . acetaminophen (TYLENOL) 500 MG tablet Take 500-1,000 mg by mouth every 6 (six) hours as needed for headache.  Marland Kitchen atenolol (TENORMIN) 100 MG tablet TAKE 1 TABLET BY MOUTH EVERY MORNING (Patient taking differently: TAKE 1/2 TABLET BY MOUTH EVERY MORNING)  . cholecalciferol (VITAMIN D) 1000 UNITS tablet Take 4,000 Units by mouth daily.   . clonazePAM (KLONOPIN) 1 MG tablet TAKE  1/2 TO 1 TABLET BY MOUTH UP TO THREE TIMES DAILY AS NEEDED FOR NERVES  . gabapentin (NEURONTIN) 300 MG capsule TAKE 1 CAPSULE BY MOUTH 3-4 TIMES DAILY FOR FACIAL PAIN  . losartan (COZAAR) 100 MG tablet TAKE 1/2 TABLET BY MOUTH DAILY (Patient taking differently: TAKE 1/2 TABLET (50mg ) BY MOUTH IN THE EVENING)  . pravastatin (PRAVACHOL) 40 MG tablet TAKE 1 TABLET BY MOUTH DAILY  . ranitidine (ZANTAC) 300 MG tablet TAKE 1 TABLET BY MOUTH TWICE DAILY AS NEEDED FOR ACID INDIGESTION ANDREFLUX  . traMADol (ULTRAM) 50 MG tablet TAKE 1 TABLET BY MOUTH 4 TIMES DAILY AS NEEDED FOR PAIN  . [DISCONTINUED] omeprazole (PRILOSEC OTC) 20 MG tablet Take 20 mg by mouth daily.   No current facility-administered medications on file prior to visit.    Allergies  Allergen Reactions  . Latex Hives    TONGUE HAD BLISTERS WHEN HAD DENTAL SURGERY  . Amoxicillin Nausea And Vomiting  . Aspirin     Gi upset  . Barbiturates   . Celebrex [Celecoxib]   . Codeine Itching  . Evista [Raloxifene]   . Lipitor [Atorvastatin]   . Morphine And Related   . Oruvail [Ketoprofen]   . Pantoprazole Diarrhea  . Paraffin   . Prochlorperazine Other (See Comments)    Uncontrolled shaking  . Proloprim [Trimethoprim]   . Venlafaxine Nausea Only  . Vibra-Tab [  Doxycycline]   . Vioxx [Rofecoxib]     edema  . Betadine [Povidone Iodine] Rash  . Caffeine Palpitations   PMHx:   Past Medical History:  Diagnosis Date  . Anxiety   . Arthritis   . Blood transfusion   . Candida esophagitis ([facility]) 2013   EGD  . Cataract   . Chronic kidney disease    STONES  . Diverticulosis of colon (without mention of hemorrhage) 2007   Colonoscopy  . Dysrhythmia    RX  . Emphysema of lung ([facility])   . Family history of colon cancer    sister  . Fibromyalgia   . GERD (gastroesophageal reflux disease)   . Headache(784.0)   . Hyperlipemia   . Hypertension   . Kidney stones   . Ptosis, bilateral    Immunization History  Administered Date(s)  Administered  . Influenza, High Dose Seasonal PF 03/25/2014, 04/05/2016  . Influenza-Unspecified 03/09/2013, 03/11/2015  . Pneumococcal Conjugate-13 12/21/2015  . Pneumococcal Polysaccharide-23 06/25/2002  . Td 06/26/2003   Past Surgical History:  Procedure Laterality Date  . ABDOMINAL HYSTERECTOMY    . BACK SURGERY     X2  . BACK SURGERY    . CERVICAL DISC SURGERY    . COLONOSCOPY    . FACIAL FRACTURE SURGERY    . HAND SURGERY     BIL   . ORIF TRIPOD FRACTURE  07/13/2011   Procedure: OPEN REDUCTION INTERNAL FIXATION (ORIF) TRIPOD FRACTURE;  Surgeon: Tyson Alias, MD;  Location: [facility];  Service: ENT;  Laterality: Right;  ORIF RIGHT ZYGOMA, ORBITAL FLOOR EXPLORATION WITH FROST STITCH (TEMPORARY TARSORRHAPHY)  . PTOSIS REPAIR Bilateral 11/22/2016   Procedure: INTERNAL PTOSIS REPAIR;  Surgeon: Clista Bernhardt, MD;  Location: [facility];  Service: Ophthalmology;  Laterality: Bilateral;  . SHOULDER ARTHROSCOPY W/ ROTATOR CUFF REPAIR     LFT   FHx:    Reviewed / unchanged  SHx:    Reviewed / unchanged  Systems Review:  Constitutional: Denies fever, chills, wt changes, headaches, insomnia, fatigue, night sweats, change in appetite. Eyes: Denies redness, blurred vision, diplopia, discharge, itchy, watery eyes.  ENT: Denies discharge, congestion, post nasal drip, epistaxis, sore throat, earache, hearing loss, dental pain, tinnitus, vertigo, sinus pain, snoring.  CV: Denies chest pain, palpitations, irregular heartbeat, syncope, dyspnea, diaphoresis, orthopnea, PND, claudication or edema. Respiratory: denies cough, dyspnea, DOE, pleurisy, hoarseness, laryngitis, wheezing.  Gastrointestinal: Denies dysphagia, odynophagia, heartburn, reflux, water brash, abdominal pain or cramps, nausea, vomiting, bloating, diarrhea, constipation, hematemesis, melena, hematochezia  or hemorrhoids. Genitourinary: Denies dysuria, frequency, urgency, nocturia, hesitancy, discharge, hematuria or flank  pain. Musculoskeletal: Denies arthralgias, myalgias, stiffness, jt. swelling, pain, limping or strain/sprain.  Skin: Denies pruritus, rash, hives, warts, acne, eczema or change in skin lesion(s). Neuro: No weakness, tremor, incoordination, spasms, paresthesia or pain. Psychiatric: Denies confusion, memory loss or sensory loss. Endo: Denies change in weight, skin or hair change.  Heme/Lymph: No excessive bleeding, bruising or enlarged lymph nodes.  Physical Exam  BP 110/62   Pulse 70   Temp (!) 97.2 F (36.2 C)   Resp 16   Ht 5\' 3"  (1.6 m)   Wt 108 lb (49 kg)   SpO2 97%   BMI 19.13 kg/m   Appears well nourished, well groomed  and in no distress.  Eyes: PERRLA, EOMs, conjunctiva no swelling or erythema. Sinuses: No frontal/maxillary tenderness ENT/Mouth: EAC's clear, TM's nl w/o erythema, bulging. Nares clear w/o erythema, swelling, exudates. Oropharynx clear without erythema or exudates. Oral  hygiene is good. Tongue normal, non obstructing. Hearing intact.  Neck: Supple. Thyroid nl. Car 2+/2+ without bruits, nodes or JVD. Chest: Respirations nl with BS clear & equal w/o rales, rhonchi, wheezing or stridor.  Cor: Heart sounds normal w/ regular rate and rhythm without sig. murmurs, gallops, clicks or rubs. Peripheral pulses normal and equal  without edema.  Abdomen: Soft & bowel sounds normal. Non-tender w/o guarding, rebound, hernias, masses or organomegaly.  Lymphatics: Unremarkable.  Musculoskeletal: Full ROM all peripheral extremities, joint stability, 5/5 strength and normal gait.  Skin: Warm, dry without exposed rashes, lesions or ecchymosis apparent.  Neuro: Cranial nerves intact, reflexes equal bilaterally. Sensory-motor testing grossly intact. Tendon reflexes grossly intact.  Pysch: Alert & oriented x 3.  Insight and judgement nl & appropriate. No ideations.  Assessment and Plan:  1. Essential hypertension  - Continue medication, monitor blood pressure at home.  -  Continue DASH diet. Reminder to go to the ER if any CP,  SOB, nausea, dizziness, severe HA, changes vision/speech.  - CBC with Differential/Platelet - BASIC METABOLIC PANEL WITH GFR - Magnesium - TSH  2. Hyperlipidemia, mixed  - Continue diet/meds, exercise,& lifestyle modifications.  - Continue monitor periodic cholesterol/liver & renal functions   - Hepatic function panel - Lipid panel - TSH  3. Prediabetes  - Continue diet, exercise, lifestyle modifications.  - Monitor appropriate labs.  - Hemoglobin A1c - Insulin, random  4. Vitamin D deficiency  - Continue supplementation.  - VITAMIN D 25 Hydroxy   5. Gastroesophageal reflux disease  - CBC with Differential/Platelet  6. Atypical facial pain  7. Medication management  - CBC with Differential/Platelet - BASIC METABOLIC PANEL WITH GFR - Hepatic function panel - Magnesium - Lipid panel - TSH - Hemoglobin A1c - Insulin, random - VITAMIN D 25 Hydroxy        Discussed  regular exercise, BP monitoring, weight control to achieve/maintain BMI less than 25 and discussed med and SE's. Recommended labs to assess and monitor clinical status with further disposition pending results of labs. Over 30 minutes of exam, counseling, chart review was performed.

## 2017-05-09 ENCOUNTER — Encounter: Payer: Self-pay | Admitting: Internal Medicine

## 2017-05-09 ENCOUNTER — Ambulatory Visit: Payer: PPO | Admitting: Internal Medicine

## 2017-05-09 VITALS — BP 110/62 | HR 70 | Temp 97.2°F | Resp 16 | Ht 63.0 in | Wt 108.0 lb

## 2017-05-09 DIAGNOSIS — I10 Essential (primary) hypertension: Secondary | ICD-10-CM | POA: Diagnosis not present

## 2017-05-09 DIAGNOSIS — G501 Atypical facial pain: Secondary | ICD-10-CM | POA: Diagnosis not present

## 2017-05-09 DIAGNOSIS — Z79899 Other long term (current) drug therapy: Secondary | ICD-10-CM

## 2017-05-09 DIAGNOSIS — K219 Gastro-esophageal reflux disease without esophagitis: Secondary | ICD-10-CM | POA: Diagnosis not present

## 2017-05-09 DIAGNOSIS — E559 Vitamin D deficiency, unspecified: Secondary | ICD-10-CM

## 2017-05-09 DIAGNOSIS — E782 Mixed hyperlipidemia: Secondary | ICD-10-CM

## 2017-05-09 DIAGNOSIS — R7303 Prediabetes: Secondary | ICD-10-CM

## 2017-05-10 LAB — HEMOGLOBIN A1C
EAG (MMOL/L): 6.2 (calc)
HEMOGLOBIN A1C: 5.5 %{Hb} (ref <5.7)
MEAN PLASMA GLUCOSE: 111 (calc)

## 2017-05-10 LAB — LIPID PANEL
Cholesterol: 192 mg/dL (ref <200)
HDL: 94 mg/dL (ref >50)
LDL Cholesterol (Calc): 75 mg/dL (calc)
Non-HDL Cholesterol (Calc): 98 mg/dL (calc) (ref <130)
TRIGLYCERIDES: 147 mg/dL (ref <150)
Total CHOL/HDL Ratio: 2 (calc) (ref <5.0)

## 2017-05-10 LAB — MAGNESIUM: Magnesium: 1.9 mg/dL (ref 1.5–2.5)

## 2017-05-10 LAB — CBC WITH DIFFERENTIAL/PLATELET
Basophils Absolute: 51 cells/uL (ref 0–200)
Basophils Relative: 0.6 %
EOS PCT: 1.1 %
Eosinophils Absolute: 94 cells/uL (ref 15–500)
HCT: 36 % (ref 35.0–45.0)
Hemoglobin: 12.2 g/dL (ref 11.7–15.5)
LYMPHS ABS: 1394 {cells}/uL (ref 850–3900)
MCH: 31 pg (ref 27.0–33.0)
MCHC: 33.9 g/dL (ref 32.0–36.0)
MCV: 91.4 fL (ref 80.0–100.0)
MONOS PCT: 9.4 %
MPV: 10 fL (ref 7.5–12.5)
NEUTROS PCT: 72.5 %
Neutro Abs: 6163 cells/uL (ref 1500–7800)
Platelets: 276 10*3/uL (ref 140–400)
RBC: 3.94 10*6/uL (ref 3.80–5.10)
RDW: 12.8 % (ref 11.0–15.0)
TOTAL LYMPHOCYTE: 16.4 %
WBC mixed population: 799 cells/uL (ref 200–950)
WBC: 8.5 10*3/uL (ref 3.8–10.8)

## 2017-05-10 LAB — BASIC METABOLIC PANEL WITH GFR
BUN: 9 mg/dL (ref 7–25)
CALCIUM: 9.4 mg/dL (ref 8.6–10.4)
CHLORIDE: 95 mmol/L — AB (ref 98–110)
CO2: 32 mmol/L (ref 20–32)
Creat: 0.68 mg/dL (ref 0.60–0.88)
GFR, Est African American: 95 mL/min/{1.73_m2} (ref >=60)
GFR, Est Non African American: 82 mL/min/{1.73_m2} (ref >=60)
GLUCOSE: 81 mg/dL (ref 65–99)
POTASSIUM: 4.4 mmol/L (ref 3.5–5.3)
Sodium: 133 mmol/L — ABNORMAL LOW (ref 135–146)

## 2017-05-10 LAB — HEPATIC FUNCTION PANEL
AG Ratio: 1.5 (calc) (ref 1.0–2.5)
ALBUMIN MSPROF: 4.6 g/dL (ref 3.6–5.1)
ALKALINE PHOSPHATASE (APISO): 74 U/L (ref 33–130)
ALT: 12 U/L (ref 6–29)
AST: 18 U/L (ref 10–35)
BILIRUBIN DIRECT: 0.1 mg/dL (ref 0.0–0.2)
Globulin: 3 g/dL (calc) (ref 1.9–3.7)
Indirect Bilirubin: 0.4 mg/dL (calc) (ref 0.2–1.2)
Total Bilirubin: 0.5 mg/dL (ref 0.2–1.2)
Total Protein: 7.6 g/dL (ref 6.1–8.1)

## 2017-05-10 LAB — INSULIN, RANDOM: INSULIN: 3.4 u[IU]/mL (ref 2.0–19.6)

## 2017-05-10 LAB — VITAMIN D 25 HYDROXY (VIT D DEFICIENCY, FRACTURES): Vit D, 25-Hydroxy: 61 ng/mL (ref 30–100)

## 2017-05-10 LAB — TSH: TSH: 0.75 m[IU]/L (ref 0.40–4.50)

## 2017-05-24 DIAGNOSIS — H2513 Age-related nuclear cataract, bilateral: Secondary | ICD-10-CM | POA: Diagnosis not present

## 2017-05-24 DIAGNOSIS — H52223 Regular astigmatism, bilateral: Secondary | ICD-10-CM | POA: Diagnosis not present

## 2017-05-24 DIAGNOSIS — H5203 Hypermetropia, bilateral: Secondary | ICD-10-CM | POA: Diagnosis not present

## 2017-06-06 ENCOUNTER — Ambulatory Visit: Payer: PPO | Admitting: Gastroenterology

## 2017-06-07 ENCOUNTER — Other Ambulatory Visit: Payer: Self-pay | Admitting: Internal Medicine

## 2017-06-10 ENCOUNTER — Encounter (INDEPENDENT_AMBULATORY_CARE_PROVIDER_SITE_OTHER): Payer: Self-pay | Admitting: Orthopedic Surgery

## 2017-06-10 ENCOUNTER — Ambulatory Visit (INDEPENDENT_AMBULATORY_CARE_PROVIDER_SITE_OTHER): Payer: PPO | Admitting: Orthopedic Surgery

## 2017-06-10 VITALS — Ht 63.0 in | Wt 108.0 lb

## 2017-06-10 DIAGNOSIS — M7541 Impingement syndrome of right shoulder: Secondary | ICD-10-CM | POA: Diagnosis not present

## 2017-06-10 DIAGNOSIS — M25561 Pain in right knee: Secondary | ICD-10-CM

## 2017-06-10 DIAGNOSIS — G8929 Other chronic pain: Secondary | ICD-10-CM | POA: Diagnosis not present

## 2017-06-10 MED ORDER — LIDOCAINE HCL 1 % IJ SOLN
5.0000 mL | INTRAMUSCULAR | Status: AC | PRN
  Administered 2017-06-10: 5 mL

## 2017-06-10 MED ORDER — METHYLPREDNISOLONE ACETATE 40 MG/ML IJ SUSP
40.0000 mg | INTRAMUSCULAR | Status: AC | PRN
  Administered 2017-06-10: 40 mg via INTRA_ARTICULAR

## 2017-06-10 NOTE — Progress Notes (Signed)
Office Visit Note   Patient: Debra White           Date of Birth: [birth year]           MRN: [id number] Visit Date: 06/10/2017              Requested by: Unk Pinto, [address] [place], [place] [address] PCP: Unk Pinto, MD  Chief Complaint  Patient presents with  . Right Knee - Pain    S/p injection 12/28/16      HPI: Patient is a 83 year old woman with osteoarthritis of the right knee she is status post an injection approximately 5 months ago she is also has impingement syndrome of both shoulders she is status post arthroscopic intervention for the left shoulder has been having pain with the right shoulder.  Patient states she also is having some groin pain on the right.  Patient states that she has difficulty walking due to pain.  Assessment & Plan: Visit Diagnoses:  1. Impingement syndrome of right shoulder   2. Chronic pain of right knee     Plan: The right shoulder right knee was injected patient will follow-up as needed.  Discussed the importance of her using a ambulatory device such as a walker.  She states she has a cane but this does not provide her enough balance support.  Follow-Up Instructions: Return if symptoms worsen or fail to improve.   Ortho Exam  Patient is alert, oriented, no adenopathy, well-dressed, normal affect, normal respiratory effort. Examination patient has significant muscle atrophy in both upper and lower extremities.  Patient has unsteady gait and has difficulty with balance getting from a sitting to a standing position.  She has an antalgic gait.  She has no pain with passive range of motion of the right hip she has a negative straight leg raise she has tenderness to palpation around the right knee there is no effusion.  She has range of motion of the right knee from 0-45 degrees.  Semination the right shoulder she has pain with Neer and Hawkins impingement test pain with a drop arm test she has full range of  motion.  Imaging: No results found. No images are attached to the encounter.  Labs: Lab Results  Component Value Date   HGBA1C 5.5 05/09/2017   HGBA1C 5.2 10/29/2016   HGBA1C 5.4 04/05/2016   ESRSEDRATE 4 10/15/2016   ESRSEDRATE 4 07/06/2016   ESRSEDRATE 22 03/30/2016   CRP 3.7 10/15/2016   CRP 3.4 07/06/2016   LABURIC 3.4 03/30/2016   REPTSTATUS 09/08/2012 FINAL 09/04/2012   CULT  09/04/2012    ABUNDANT YEAST NO SALMONELLA, SHIGELLA, CAMPYLOBACTER, YERSINIA, OR E.COLI [id number] ISOLATED Note: REDUCED NORMAL FLORA PRESENT   LABORGA NO GROWTH 03/30/2016    @LABSALLVALUES (HGBA1)@  Body mass index is 19.13 kg/m.  Orders:  No orders of the defined types were placed in this encounter.  No orders of the defined types were placed in this encounter.    Procedures: Large Joint Inj: R knee on 06/10/2017 11:15 AM Indications: pain and diagnostic evaluation Details: 22 G 1.5 in needle, anteromedial approach  Arthrogram: No  Medications: 5 mL lidocaine 1 %; 40 mg methylPREDNISolone acetate 40 MG/ML Outcome: tolerated well, no immediate complications Procedure, treatment alternatives, risks and benefits explained, specific risks discussed. Consent was given by the patient. Immediately prior to procedure a time out was called to verify the correct patient, procedure, equipment, support staff and site/side marked as required. Patient was prepped  and draped in the usual sterile fashion.   Large Joint Inj: R subacromial bursa on 06/10/2017 11:15 AM Indications: diagnostic evaluation and pain Details: 22 G 1.5 in needle, posterior approach  Arthrogram: No  Medications: 5 mL lidocaine 1 %; 40 mg methylPREDNISolone acetate 40 MG/ML Outcome: tolerated well, no immediate complications Procedure, treatment alternatives, risks and benefits explained, specific risks discussed. Consent was given by the patient. Immediately prior to procedure a time out was called to verify the correct  patient, procedure, equipment, support staff and site/side marked as required. Patient was prepped and draped in the usual sterile fashion.      Clinical Data: No additional findings.  ROS:  All other systems negative, except as noted in the HPI. Review of Systems  Objective: Vital Signs: Ht 5\' 3"  (1.6 m)   Wt 108 lb (49 kg)   BMI 19.13 kg/m   Specialty Comments:  No specialty comments available.  PMFS History: Patient Active Problem List   Diagnosis Date Noted  . Diverticulitis 04/05/2017  . 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 Osteoporosis 12/21/2015  . Mild malnutrition ([facility]) 05/11/2015  . Encounter for Medicare annual wellness exam 04/08/2015  . Prediabetes 01/27/2014  . Medication management 01/27/2014  . 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 Vitamin D deficiency 07/08/2013  . Hypertension   . Hyperlipidemia   . GERD   . Headache, post-traumatic, chronic 09/20/2011  . Fracture, zygoma closed ([facility]) 07/13/2011   Past Medical History:  Diagnosis Date  . Anxiety   . Arthritis   . Blood transfusion   . C. difficile colitis   . Candida esophagitis ([facility]) 2013   EGD  . Cataract   . Chronic kidney disease    STONES  . Diverticulosis of colon (without mention of hemorrhage) 2007   Colonoscopy  . Dysrhythmia    RX  . Emphysema of lung ([facility])   . Family history of colon cancer    sister  . Fibromyalgia   . GERD (gastroesophageal reflux disease)   . Headache(784.0)   . Hyperlipemia   . Hypertension   . Kidney stones   . Ptosis, bilateral     Family History  Problem Relation Age of Onset  . Heart disease Mother   . Heart disease Father   . Breast cancer Sister   . Colon cancer Sister   . Cancer Sister        breast  . Cancer Sister        colon  . Cancer Sister        colon  . Hypertension Daughter   . Hyperlipidemia Daughter     Past Surgical History:  Procedure  Laterality Date  . ABDOMINAL HYSTERECTOMY    . BACK SURGERY     X2  . BACK SURGERY    . CERVICAL DISC SURGERY    . COLONOSCOPY    . FACIAL FRACTURE SURGERY    . HAND SURGERY     BIL   . ORIF TRIPOD FRACTURE  07/13/2011   Procedure: OPEN REDUCTION INTERNAL FIXATION (ORIF) TRIPOD FRACTURE;  Surgeon: Tyson Alias, MD;  Location: [facility];  Service: ENT;  Laterality: Right;  ORIF RIGHT ZYGOMA, ORBITAL FLOOR EXPLORATION WITH FROST STITCH (TEMPORARY TARSORRHAPHY)  . PTOSIS REPAIR Bilateral 11/22/2016   Procedure: INTERNAL PTOSIS REPAIR;  Surgeon: Clista Bernhardt, MD;  Location: [facility];  Service: Ophthalmology;  Laterality: Bilateral;  . SHOULDER ARTHROSCOPY W/ ROTATOR CUFF  REPAIR     LFT   Social History   Occupational History  . Occupation: Retired    Fish farm manager: RETIRED  Tobacco Use  . Smoking status: Never Smoker  . Smokeless tobacco: Never Used  Substance and Sexual Activity  . Alcohol use: No  . Drug use: No  . Sexual activity: Not on file    Comment: HYSTER

## 2017-07-01 ENCOUNTER — Other Ambulatory Visit: Payer: Self-pay | Admitting: Internal Medicine

## 2017-07-01 DIAGNOSIS — Z1231 Encounter for screening mammogram for malignant neoplasm of breast: Secondary | ICD-10-CM

## 2017-07-09 DIAGNOSIS — M7061 Trochanteric bursitis, right hip: Secondary | ICD-10-CM | POA: Diagnosis not present

## 2017-07-09 DIAGNOSIS — M25561 Pain in right knee: Secondary | ICD-10-CM | POA: Diagnosis not present

## 2017-07-13 ENCOUNTER — Other Ambulatory Visit: Payer: Self-pay | Admitting: Internal Medicine

## 2017-07-22 ENCOUNTER — Ambulatory Visit (INDEPENDENT_AMBULATORY_CARE_PROVIDER_SITE_OTHER): Payer: PPO | Admitting: Gastroenterology

## 2017-07-22 ENCOUNTER — Encounter: Payer: Self-pay | Admitting: Gastroenterology

## 2017-07-22 VITALS — BP 114/62 | HR 74 | Ht 64.0 in | Wt 108.0 lb

## 2017-07-22 DIAGNOSIS — Z800 Family history of malignant neoplasm of digestive organs: Secondary | ICD-10-CM | POA: Diagnosis not present

## 2017-07-22 DIAGNOSIS — K580 Irritable bowel syndrome with diarrhea: Secondary | ICD-10-CM

## 2017-07-22 MED ORDER — DICYCLOMINE HCL 10 MG PO CAPS: 10.0000 mg | ORAL_CAPSULE | Freq: Three times a day (TID) | ORAL | 11 refills | Status: DC

## 2017-07-22 NOTE — Patient Instructions (Signed)
We have sent the following medications to your pharmacy for you to pick up at your convenience:dicyclomine.   Thank you for choosing me and [facility].  Malcolm T. Stark, Jr., MD., FACG   

## 2017-07-22 NOTE — Progress Notes (Signed)
History of Present Illness: This is an 84 year old female referred by Unk Pinto, MD for follow up after hospitalization for C. difficile in October.  She completely recovered from her C. difficile months ago.  She has a long history of IBS-D.  She states she avoids many foods and eats like meals which helps with her symptoms.  Her most common symptoms are epigastric discomfort and fullness following meals and frequent postprandial urgent bowel movements or diarrhea.  Colonoscopy by Dr. Sharlett Iles in September 2013 showed severe left colon diverticulosis and a small polyp (no precanerous change).   Allergies  Allergen Reactions  . Latex Hives    TONGUE HAD BLISTERS WHEN HAD DENTAL SURGERY  . Amoxicillin Nausea And Vomiting  . Aspirin     Gi upset  . Barbiturates   . Celebrex [Celecoxib]   . Codeine Itching  . Evista [Raloxifene]   . Lipitor [Atorvastatin]   . Morphine And Related   . Oruvail [Ketoprofen]   . Pantoprazole Diarrhea  . Paraffin   . Prochlorperazine Other (See Comments)    Uncontrolled shaking  . Proloprim [Trimethoprim]   . Venlafaxine Nausea Only  . Vibra-Tab [Doxycycline]   . Vioxx [Rofecoxib]     edema  . Betadine [Povidone Iodine] Rash  . Caffeine Palpitations   Outpatient Medications Prior to Visit  Medication Sig Dispense Refill  . acetaminophen (TYLENOL) 500 MG tablet Take 500-1,000 mg by mouth every 6 (six) hours as needed for headache.    Marland Kitchen atenolol (TENORMIN) 100 MG tablet TAKE 1 TABLET BY MOUTH EVERY MORNING (Patient taking differently: TAKE 1/2 TABLET BY MOUTH EVERY MORNING) 90 tablet 1  . cholecalciferol (VITAMIN D) 1000 UNITS tablet Take 4,000 Units by mouth daily.     . clonazePAM (KLONOPIN) 1 MG tablet TAKE 1/2 TO 1 TABLET BY MOUTH UP TO THREE TIMES DAILY AS NEEDED FOR NERVES 90 tablet 2  . gabapentin (NEURONTIN) 300 MG capsule TAKE 1 CAPSULE BY MOUTH 3 TO 4 TIMES DAILY FOR FACIAL PAIN 360 capsule 1  . losartan (COZAAR) 100 MG tablet TAKE  1/2 TABLET BY MOUTH DAILY 90 tablet 1  . pravastatin (PRAVACHOL) 40 MG tablet TAKE 1 TABLET BY MOUTH DAILY 90 tablet 1  . ranitidine (ZANTAC) 300 MG tablet TAKE 1 TABLET BY MOUTH TWICE DAILY AS NEEDED FOR ACID INDIGESTION ANDREFLUX 180 tablet 0  . traMADol (ULTRAM) 50 MG tablet TAKE 1 TABLET BY MOUTH 4 TIMES DAILY AS NEEDED FOR PAIN 120 tablet 1   No facility-administered medications prior to visit.    Past Medical History:  Diagnosis Date  . Anxiety   . Arthritis   . Blood transfusion   . C. difficile colitis   . Candida esophagitis ([facility]) 2013   EGD  . Cataract   . Chronic kidney disease    STONES  . Diverticulosis of colon (without mention of hemorrhage) 2007   Colonoscopy  . Dysrhythmia    RX  . Emphysema of lung ([facility])   . Family history of colon cancer    sister  . Fibromyalgia   . GERD (gastroesophageal reflux disease)   . Headache(784.0)   . Hyperlipemia   . Hypertension   . Kidney stones   . Ptosis, bilateral    Past Surgical History:  Procedure Laterality Date  . ABDOMINAL HYSTERECTOMY    . BACK SURGERY     X2  . BACK SURGERY    . CERVICAL DISC SURGERY    . COLONOSCOPY    .  FACIAL FRACTURE SURGERY    . HAND SURGERY     BIL   . ORIF TRIPOD FRACTURE  07/13/2011   Procedure: OPEN REDUCTION INTERNAL FIXATION (ORIF) TRIPOD FRACTURE;  Surgeon: Tyson Alias, MD;  Location: [facility];  Service: ENT;  Laterality: Right;  ORIF RIGHT ZYGOMA, ORBITAL FLOOR EXPLORATION WITH FROST STITCH (TEMPORARY TARSORRHAPHY)  . PTOSIS REPAIR Bilateral 11/22/2016   Procedure: INTERNAL PTOSIS REPAIR;  Surgeon: Clista Bernhardt, MD;  Location: [facility];  Service: Ophthalmology;  Laterality: Bilateral;  . SHOULDER ARTHROSCOPY W/ ROTATOR CUFF REPAIR     LFT   Social History   Socioeconomic History  . Marital status: Married    Spouse name: None  . Number of children: 1  . Years of education: 71  . Highest education level: None  Social Needs  . Financial resource strain: None  . Food  insecurity - worry: None  . Food insecurity - inability: None  . Transportation needs - medical: None  . Transportation needs - non-medical: None  Occupational History  . Occupation: Retired    Fish farm manager: RETIRED  Tobacco Use  . Smoking status: Never Smoker  . Smokeless tobacco: Never Used  Substance and Sexual Activity  . Alcohol use: No  . Drug use: No  . Sexual activity: None    Comment: HYSTER  Other Topics Concern  . None  Social History Narrative   [place].   Right-handed.   No caffeine use.   Family History  Problem Relation Age of Onset  . Heart disease Mother   . Heart disease Father   . Breast cancer Sister   . Colon cancer Sister   . Cancer Sister        breast  . Cancer Sister        colon  . Cancer Sister        colon  . Hypertension Daughter   . Hyperlipidemia Daughter       Review of Systems: Pertinent positive and negative review of systems were noted in the above HPI section. All other review of systems were otherwise negative.    Physical Exam: General: Well developed, well nourished, elderly, frail, no acute distress Head: Normocephalic and atraumatic Eyes:  sclerae anicteric, EOMI Ears: Normal auditory acuity Mouth: No deformity or lesions Neck: Supple, no masses or thyromegaly Lungs: Clear throughout to auscultation Heart: Regular rate and rhythm; no murmurs, rubs or bruits Abdomen: Soft, non tender and non distended. No masses, hepatosplenomegaly or hernias noted. Normal Bowel sounds Rectal: not done Musculoskeletal: Symmetrical with no gross deformities  Skin: No lesions on visible extremities Pulses:  Normal pulses noted Extremities: No clubbing, cyanosis, edema or deformities noted Neurological: Alert oriented x 4, grossly nonfocal Cervical Nodes:  No significant cervical adenopathy Inguinal Nodes: No significant inguinal adenopathy Psychological:  Alert and cooperative. Normal mood and  affect  Assessment and Recommendations:  1.  IBS-D.  Avoid foods that trigger symptoms.  Trial of dicyclomine 10 mg 3 times daily before meals. Call office within 2-3 weeks if symptoms have not improved  2. C. difficile diarrhea, resolved.   3. Family history of colon cancer.  Last colonoscopy in 2013 was without precancerous polyps.  Due to her age we will not plan for future screening colonoscopies.   cc: Unk Pinto, MD [address] [place], [place] [address]

## 2017-08-08 ENCOUNTER — Encounter: Payer: Self-pay | Admitting: Physician Assistant

## 2017-08-08 ENCOUNTER — Ambulatory Visit: Payer: PPO

## 2017-08-08 ENCOUNTER — Telehealth: Payer: Self-pay

## 2017-08-08 ENCOUNTER — Ambulatory Visit (INDEPENDENT_AMBULATORY_CARE_PROVIDER_SITE_OTHER): Payer: PPO | Admitting: Physician Assistant

## 2017-08-08 VITALS — BP 140/80 | HR 60 | Temp 97.6°F | Resp 14 | Ht 64.0 in | Wt 110.2 lb

## 2017-08-08 DIAGNOSIS — Z0000 Encounter for general adult medical examination without abnormal findings: Secondary | ICD-10-CM

## 2017-08-08 DIAGNOSIS — E441 Mild protein-calorie malnutrition: Secondary | ICD-10-CM

## 2017-08-08 DIAGNOSIS — M810 Age-related osteoporosis without current pathological fracture: Secondary | ICD-10-CM | POA: Diagnosis not present

## 2017-08-08 DIAGNOSIS — G44329 Chronic post-traumatic headache, not intractable: Secondary | ICD-10-CM | POA: Diagnosis not present

## 2017-08-08 DIAGNOSIS — I771 Stricture of artery: Secondary | ICD-10-CM

## 2017-08-08 DIAGNOSIS — R6889 Other general symptoms and signs: Secondary | ICD-10-CM

## 2017-08-08 DIAGNOSIS — I700 Atherosclerosis of aorta: Secondary | ICD-10-CM

## 2017-08-08 DIAGNOSIS — I10 Essential (primary) hypertension: Secondary | ICD-10-CM

## 2017-08-08 DIAGNOSIS — J439 Emphysema, unspecified: Secondary | ICD-10-CM

## 2017-08-08 DIAGNOSIS — Z79899 Other long term (current) drug therapy: Secondary | ICD-10-CM

## 2017-08-08 DIAGNOSIS — R300 Dysuria: Secondary | ICD-10-CM

## 2017-08-08 DIAGNOSIS — E559 Vitamin D deficiency, unspecified: Secondary | ICD-10-CM | POA: Diagnosis not present

## 2017-08-08 DIAGNOSIS — G501 Atypical facial pain: Secondary | ICD-10-CM | POA: Diagnosis not present

## 2017-08-08 DIAGNOSIS — Z0001 Encounter for general adult medical examination with abnormal findings: Secondary | ICD-10-CM | POA: Diagnosis not present

## 2017-08-08 DIAGNOSIS — E782 Mixed hyperlipidemia: Secondary | ICD-10-CM | POA: Diagnosis not present

## 2017-08-08 DIAGNOSIS — K219 Gastro-esophageal reflux disease without esophagitis: Secondary | ICD-10-CM

## 2017-08-08 MED ORDER — TRIAMCINOLONE ACETONIDE 0.1 % EX OINT: 1.0000 "application " | TOPICAL_OINTMENT | Freq: Two times a day (BID) | CUTANEOUS | 1 refills | Status: DC

## 2017-08-08 MED ORDER — NYSTATIN 100000 UNIT/GM EX OINT: 1.0000 "application " | TOPICAL_OINTMENT | Freq: Two times a day (BID) | CUTANEOUS | 0 refills | Status: DC

## 2017-08-08 MED ORDER — CEPHALEXIN 500 MG PO CAPS: 500.0000 mg | ORAL_CAPSULE | Freq: Four times a day (QID) | ORAL | 0 refills | Status: AC

## 2017-08-08 NOTE — Progress Notes (Signed)
Medicare annual wellness visit and OV  Assessment and Plan:   Dysuria Benign AB, no flank pain, no fever + chills Will treat as UTI, check labs + vaginal atrophy and irritation will treat with ointments If not better will get CT AB or refer to urology Normal CT AB 03/2017 -     cephALEXin (KEFLEX) 500 MG capsule; Take 1 capsule (500 mg total) by mouth 4 (four) times daily for 10 days. -     CBC with Differential/Platelet -     BASIC METABOLIC PANEL WITH GFR -     Urinalysis, Routine w reflex microscopic -     Urine Culture -     triamcinolone ointment (KENALOG) 0.1 %; Apply 1 application topically 2 (two) times daily. -     nystatin ointment (MYCOSTATIN); Apply 1 application topically 2 (two) times daily.  Atherosclerosis of aorta (HCC) Control blood pressure, cholesterol, glucose, increase exercise.   Medication management -     CBC with Differential/Platelet -     BASIC METABOLIC PANEL WITH GFR -     Hepatic function panel  Tortuous aorta (HCC) Control blood pressure, cholesterol, glucose, increase exercise.   Essential hypertension - continue medications, DASH diet, exercise and monitor at home. Call if greater than 130/80.   Pulmonary emphysema, unspecified emphysema type ([place]) No triggers, well controlled symptoms, cont to monitor  Mild malnutrition ([place]) Add ensure, monitor weight  Mixed hyperlipidemia -continue medications, check lipids, decrease fatty foods, increase activity.   Chronic post-traumatic headache, not intractable Continue follow up neuro, continue medications  Atypical facial pain Continue follow up neuro, continue medications  Vitamin D deficiency Continue supplement  Osteoporosis, unspecified osteoporosis type, unspecified pathological fracture presence - get dexa next year, continue Vit D and Ca, weight bearing exercises  Gastroesophageal reflux disease, esophagitis presence not specified Continue PPI/H2 blocker, diet  discussed  Encounter for Medicare annual wellness exam 1 year  Continue diet and meds as discussed. Further disposition pending results of labs. Future Appointments  Date Time Provider [place]  08/12/2017 10:30 AM Vicie Mutters, PA-C [facility] None  08/30/2017 10:10 AM [facility] 3 [facility]  11/14/2017  9:00 AM Unk Pinto, MD [facility] None    During the course of the visit the patient was educated and counseled about appropriate screening and preventive services including:    Pneumococcal vaccine   Influenza vaccine  Td vaccine  Screening electrocardiogram  Bone densitometry screening  Colorectal cancer screening  Diabetes screening  Glaucoma screening  Nutrition counseling   Advanced directives: requested   HPI 84 y.o. female  presents for acute visit for possible UTI.   She states she started to have UTI symptoms mid day yesterday, she had chills, vaginal burning, incontinence. She has put vaseline on her vagina that did not help. She has lower back pain but mainly lower AB pain. Had hematuria this morning with blood in the toliet.  No diarrhea/constipation.   Patient fell Jan 2013 and sustained a right orbital fx and was seen by Dr Erie Noe, now follows with Dr. Delice Lesch. Patient has history of atypical right facial pain. She relates poor sense of smell & taste and consequent poor oral intake and loss of weight. Takes klonopin for pain and she is on gabapentin.  Her blood pressure has been controlled at home, today their BP is BP: 140/80 She does not workout. She denies chest pain, shortness of breath, dizziness.  She is on cholesterol medication and denies myalgias. Her cholesterol is at  goal. The cholesterol last visit was:   Lab Results  Component Value Date   CHOL 192 05/09/2017   HDL 94 05/09/2017   LDLCALC 77 02/05/2017   TRIG 147 05/09/2017   CHOLHDL 2.0 05/09/2017   She has been working on diet and exercise for  prediabetes, and denies polydipsia, polyuria and visual disturbances. Last A1C in the office was:  Lab Results  Component Value Date   HGBA1C 5.5 05/09/2017   Patient is on Vitamin D supplement.   Lab Results  Component Value Date   VD25OH 35 05/09/2017     She also is being followed by Dr Sharol Given for chronic DDD/LBP and has been advised she does not have a surgical problem and she has had epidural steroid injection. She complains of right shoulder pain, has been see Dr. Durward Fortes. She is very frustrated with her weight, she has been on DM boost but has not been gaining weight.  Wt Readings from Last 3 Encounters:  08/08/17 110 lb 3.2 oz (50 kg)  07/22/17 108 lb (49 kg)  06/10/17 108 lb (49 kg)    Current Medications:  Current Outpatient Medications on File Prior to Visit  Medication Sig Dispense Refill  . acetaminophen (TYLENOL) 500 MG tablet Take 500-1,000 mg by mouth every 6 (six) hours as needed for headache.    Marland Kitchen atenolol (TENORMIN) 100 MG tablet TAKE 1 TABLET BY MOUTH EVERY MORNING (Patient taking differently: TAKE 1/2 TABLET BY MOUTH EVERY MORNING) 90 tablet 1  . cholecalciferol (VITAMIN D) 1000 UNITS tablet Take 4,000 Units by mouth daily.     . clonazePAM (KLONOPIN) 1 MG tablet TAKE 1/2 TO 1 TABLET BY MOUTH UP TO THREE TIMES DAILY AS NEEDED FOR NERVES 90 tablet 2  . dicyclomine (BENTYL) 10 MG capsule Take 1 capsule (10 mg total) by mouth 3 (three) times daily before meals. 90 capsule 11  . gabapentin (NEURONTIN) 300 MG capsule TAKE 1 CAPSULE BY MOUTH 3 TO 4 TIMES DAILY FOR FACIAL PAIN 360 capsule 1  . losartan (COZAAR) 100 MG tablet TAKE 1/2 TABLET BY MOUTH DAILY 90 tablet 1  . pravastatin (PRAVACHOL) 40 MG tablet TAKE 1 TABLET BY MOUTH DAILY 90 tablet 1  . ranitidine (ZANTAC) 300 MG tablet TAKE 1 TABLET BY MOUTH TWICE DAILY AS NEEDED FOR ACID INDIGESTION ANDREFLUX 180 tablet 0  . traMADol (ULTRAM) 50 MG tablet TAKE 1 TABLET BY MOUTH 4 TIMES DAILY AS NEEDED FOR PAIN 120 tablet 1   . [DISCONTINUED] omeprazole (PRILOSEC OTC) 20 MG tablet Take 20 mg by mouth daily.     No current facility-administered medications on file prior to visit.    Medical History:  Past Medical History:  Diagnosis Date  . Anxiety   . Arthritis   . Blood transfusion   . C. difficile colitis   . Candida esophagitis ([place]) 2013   EGD  . Cataract   . Chronic kidney disease    STONES  . Diverticulitis 04/05/2017  . Diverticulosis of colon (without mention of hemorrhage) 2007   Colonoscopy  . Dysrhythmia    RX  . Emphysema of lung ([place])   . Family history of colon cancer    sister  . Fibromyalgia   . Fracture, zygoma closed ([place]) 07/13/2011  . GERD (gastroesophageal reflux disease)   . Headache(784.0)   . Hyperlipemia   . Hypertension   . Kidney stones   . Ptosis, bilateral    Allergies Allergies  Allergen Reactions  . Latex Hives  TONGUE HAD BLISTERS WHEN HAD DENTAL SURGERY  . Amoxicillin Nausea And Vomiting  . Aspirin     Gi upset  . Barbiturates   . Celebrex [Celecoxib]   . Codeine Itching  . Evista [Raloxifene]   . Lipitor [Atorvastatin]   . Morphine And Related   . Oruvail [Ketoprofen]   . Pantoprazole Diarrhea  . Paraffin   . Prochlorperazine Other (See Comments)    Uncontrolled shaking  . Proloprim [Trimethoprim]   . Venlafaxine Nausea Only  . Vibra-Tab [Doxycycline]   . Vioxx [Rofecoxib]     edema  . Betadine [Povidone Iodine] Rash  . Caffeine Palpitations   Names of Other Physician/Practitioners you currently use: 1. [facility] here for primary care 2. Dr Luther Hearing, eye doctor, last visit July 2018 3. Dr Olena Heckle, [place], dentist,q 9 months Patient Care Team: Unk Pinto, MD as PCP - General (Internal Medicine) Darleen Crocker, MD as Consulting Physician (Ophthalmology) Ladene Artist, MD as Consulting Physician (Gastroenterology) Cameron Sprang, MD as Consulting Physician (Neurology) Jodi Marble, MD  as Consulting Physician (Otolaryngology)  Immunization History  Administered Date(s) Administered  . Influenza, High Dose Seasonal PF 03/25/2014, 04/05/2016  . Influenza-Unspecified 03/09/2013, 03/11/2015  . Pneumococcal Conjugate-13 12/21/2015  . Pneumococcal Polysaccharide-23 06/25/2002  . Td 06/26/2003    Influenza 2018 Pneumonia 2004 declines Tdap 2005, declines Shingles declines Prevnar 2017  Preventative care: Last colonoscopy: 03/17/2012 MGM screening 07/2016 DEXA 02/2016 + osteoporosis Stress test 2006 Ct AB 04/05/2017  SURGICAL HISTORY She  has a past surgical history that includes Abdominal hysterectomy; Hand surgery; Shoulder arthroscopy w/ rotator cuff repair; Cervical disc surgery; Back surgery; ORIF tripod fracture (07/13/2011); Colonoscopy; Facial fracture surgery; Back surgery; and Ptosis repair (Bilateral, 11/22/2016). FAMILY HISTORY Her family history includes Breast cancer in her sister; Cancer in her sister, sister, and sister; Colon cancer in her sister; Heart disease in her father and mother; Hyperlipidemia in her daughter; Hypertension in her daughter. SOCIAL HISTORY She  reports that  has never smoked. she has never used smokeless tobacco. She reports that she does not drink alcohol or use drugs.   MEDICARE WELLNESS OBJECTIVES: Physical activity: Current Exercise Habits: The patient does not participate in regular exercise at present Cardiac risk factors: Cardiac Risk Factors include: advanced age (>38men, >46 women);sedentary lifestyle;hypertension Depression/mood screen:   Depression screen [facility] 2/9 08/08/2017  Decreased Interest 0  Down, Depressed, Hopeless 0  PHQ - 2 Score 0  Altered sleeping -  Tired, decreased energy -  Change in appetite -  Feeling bad or failure about yourself  -  Trouble concentrating -  Moving slowly or fidgety/restless -  Suicidal thoughts -  PHQ-9 Score -    ADLs:  In your present state of health, do you have any  difficulty performing the following activities: 08/08/2017 05/09/2017  Hearing? [place]? N N  Difficulty concentrating or making decisions? N N  Walking or climbing stairs? N N  Dressing or bathing? N N  Doing errands, shopping? N N  Some recent data might be hidden     Cognitive Testing  Alert? Yes  Normal Appearance?Yes  Oriented to person? Yes  Place? Yes   Time? Yes  Recall of three objects?  Yes  Can perform simple calculations? Yes  Displays appropriate judgment?Yes  Can read the correct time from a watch face?Yes  EOL planning: Does Patient Have a Medical Advance Directive?: Yes    Review of Systems:  Review of Systems  Constitutional: Positive for chills. Negative for diaphoresis, fever, malaise/fatigue and weight loss.  HENT: Negative for congestion, ear discharge, ear pain, hearing loss, nosebleeds, sore throat and tinnitus.   Eyes: Negative.   Respiratory: Negative.  Negative for stridor.   Cardiovascular: Negative.   Gastrointestinal: Negative.   Genitourinary: Positive for dysuria, frequency and hematuria. Negative for flank pain and urgency.  Musculoskeletal: Negative for back pain, falls, joint pain, myalgias and neck pain.  Skin: Negative.   Neurological: Positive for headaches. Negative for dizziness, tingling, tremors, sensory change, speech change, focal weakness, seizures, loss of consciousness and weakness.  Psychiatric/Behavioral: Negative.     Physical Exam: BP 140/80   Pulse 60   Temp 97.6 F (36.4 C)   Resp 14   Ht 5\' 4"  (1.626 m)   Wt 110 lb 3.2 oz (50 kg)   SpO2 95%   BMI 18.92 kg/m  Wt Readings from Last 3 Encounters:  08/08/17 110 lb 3.2 oz (50 kg)  07/22/17 108 lb (49 kg)  06/10/17 108 lb (49 kg)   General Appearance: Thin,frail, in no apparent distress. Eyes: PERRLA, EOMs, conjunctiva no swelling or erythema Sinuses: No Frontal/maxillary tenderness ENT/Mouth: Ext aud canals clear, TMs without erythema, bulging.  No erythema, swelling, or exudate on post pharynx.  Tonsils not swollen or erythematous. Hearing decreased Neck: Supple, thyroid normal.  Respiratory: Respiratory effort normal, BS equal bilaterally without rales, rhonchi, wheezing or stridor.  Cardio: RRR with no MRGs. Brisk peripheral pulses without edema.  Abdomen: Soft, + BS.  + tenderness suprapubic, no guarding, rebound, hernias, masses. No CVA tenderness.  Lymphatics: Non tender without lymphadenopathy.  Musculoskeletal: Full ROM, 5/5 strength, normal gait.  Skin: Warm, dry without rashes, lesions, ecchymosis.  Neuro: Cranial nerves intact. Normal muscle tone, no cerebellar symptoms. Sensation intact.  Psych: Awake and oriented X 3, normal affect, Insight and Judgment appropriate.  GYN: + vaginal atrophy and erythema, no ulcers, no lesions, masses, no discharge.   Medicare Attestation I have personally reviewed: The patient's medical and social history Their use of alcohol, tobacco or illicit drugs Their current medications and supplements The patient's functional ability including ADLs,fall risks, home safety risks, cognitive, and hearing and visual impairment Diet and physical activities Evidence for depression or mood disorders  The patient's weight, height, BMI, and visual acuity have been recorded in the chart.  I have made referrals, counseling, and provided education to the patient based on review of the above and I have provided the patient with a written personalized care plan for preventive services.  Over 40 minutes of exam, counseling, chart review was performed.  Vicie Mutters, PA-C 12:54 PM [facility]

## 2017-08-08 NOTE — Telephone Encounter (Signed)
Pt called to report questions on how to use her cream.  Per provider pt can mix together and use or she can do one and then the other.  Which ever way is fine. Pt was also told to read over her AVS & it will have instruction on the use of creams.  Pt voiced understanding & hung up.

## 2017-08-08 NOTE — Progress Notes (Signed)
Assessment and Plan:  Hypertension - soft BP today, no symptoms but suggest cutting atenolol in half, monitor blood pressure at home. Continue DASH diet.  Reminder to go to the ER if any CP, SOB, nausea, dizziness, severe HA, changes vision/speech, left arm numbness and tingling, and jaw pain.  Cholesterol - Continue diet and exercise. Check cholesterol.   Pre-diabetes -Continue diet and exercise. Check A1C- long discussion about preDM, has some anxiety about food due to this, reassured patient that only PreDM and while we want her to eat healthy that eating is much more important.   Vitamin D Def - check level and continue medications.   Malnutrition - get on boost originial.   Tortuous aorta (HCC) Control blood pressure, cholesterol, glucose, increase exercise.  -     Lipid panel  Pulmonary emphysema, unspecified emphysema type (HCC) At baseline, control triggers   Continue diet and meds as discussed. Further disposition pending results of labs. Future Appointments  Date Time Provider [place]  08/12/2017 10:30 AM Vicie Mutters, PA-C [facility] None  08/30/2017 10:10 AM [facility] MM 3 [facility]  11/14/2017  9:00 AM Unk Pinto, MD [facility] None    HPI 84 y.o. female  presents for 6 month follow up with hypertension, hyperlipidemia, prediabetes and vitamin D. She had hematuria and + UTI, states she is feeling better with keflex. She had low sodium last visit, will recheck this visit.  Her blood pressure has been controlled at home, today their BP is BP: 110/68 She does not workout. She denies chest pain, shortness of breath, dizziness.  She is on cholesterol medication and denies myalgias. Her cholesterol is at goal. The cholesterol last visit was:   Lab Results  Component Value Date   CHOL 192 05/09/2017   HDL 94 05/09/2017   LDLCALC 77 02/05/2017   TRIG 147 05/09/2017   CHOLHDL 2.0 05/09/2017   She has been working on diet and exercise for  prediabetes, and denies polydipsia, polyuria and visual disturbances. Last A1C in the office was:  Lab Results  Component Value Date   HGBA1C 5.5 05/09/2017   Patient is on Vitamin D supplement.   Lab Results  Component Value Date   VD25OH 61 05/09/2017     She has facial pain/neuraliga, continues to complains about right facial pain, Ha in the morning, and right eye pain, no change in vision, no change in speech, no weakness, has seen Dr. Delice Lesch in the past but does not want to see again, and is on lidocaine ointment, klonopin BID, tramadol bID and gabapentin 300mg  4 a day which helps some.   She is very frustrated with her weight, she has been on DM boost but has not been gaining weight.  Wt Readings from Last 3 Encounters:  08/12/17 108 lb 12.8 oz (49.4 kg)  08/08/17 110 lb 3.2 oz (50 kg)  07/22/17 108 lb (49 kg)    Current Medications:  Current Outpatient Medications on File Prior to Visit  Medication Sig Dispense Refill  . acetaminophen (TYLENOL) 500 MG tablet Take 500-1,000 mg by mouth every 6 (six) hours as needed for headache.    Marland Kitchen atenolol (TENORMIN) 100 MG tablet TAKE 1 TABLET BY MOUTH EVERY MORNING (Patient taking differently: TAKE 1/2 TABLET BY MOUTH EVERY MORNING) 90 tablet 1  . cephALEXin (KEFLEX) 500 MG capsule Take 1 capsule (500 mg total) by mouth 4 (four) times daily for 10 days. 40 capsule 0  . cholecalciferol (VITAMIN D) 1000 UNITS tablet Take 4,000  Units by mouth daily.     . clonazePAM (KLONOPIN) 1 MG tablet TAKE 1/2 TO 1 TABLET BY MOUTH UP TO THREE TIMES DAILY AS NEEDED FOR NERVES 90 tablet 2  . dicyclomine (BENTYL) 10 MG capsule Take 1 capsule (10 mg total) by mouth 3 (three) times daily before meals. 90 capsule 11  . gabapentin (NEURONTIN) 300 MG capsule TAKE 1 CAPSULE BY MOUTH 3 TO 4 TIMES DAILY FOR FACIAL PAIN 360 capsule 1  . losartan (COZAAR) 100 MG tablet TAKE 1/2 TABLET BY MOUTH DAILY 90 tablet 1  . nystatin ointment (MYCOSTATIN) Apply 1 application  topically 2 (two) times daily. 30 g 0  . pravastatin (PRAVACHOL) 40 MG tablet TAKE 1 TABLET BY MOUTH DAILY 90 tablet 1  . ranitidine (ZANTAC) 300 MG tablet TAKE 1 TABLET BY MOUTH TWICE DAILY AS NEEDED FOR ACID INDIGESTION ANDREFLUX 180 tablet 0  . traMADol (ULTRAM) 50 MG tablet TAKE 1 TABLET BY MOUTH 4 TIMES DAILY AS NEEDED FOR PAIN 120 tablet 1  . triamcinolone ointment (KENALOG) 0.1 % Apply 1 application topically 2 (two) times daily. 80 g 1  . [DISCONTINUED] omeprazole (PRILOSEC OTC) 20 MG tablet Take 20 mg by mouth daily.     No current facility-administered medications on file prior to visit.    Medical History:  Past Medical History:  Diagnosis Date  . Anxiety   . Arthritis   . Blood transfusion   . C. difficile colitis   . Candida esophagitis ([place]) 2013   EGD  . Cataract   . Chronic kidney disease    STONES  . Diverticulitis 04/05/2017  . Diverticulosis of colon (without mention of hemorrhage) 2007   Colonoscopy  . Dysrhythmia    RX  . Emphysema of lung ([place])   . Family history of colon cancer    sister  . Fibromyalgia   . Fracture, zygoma closed ([place]) 07/13/2011  . GERD (gastroesophageal reflux disease)   . Headache(784.0)   . Hyperlipemia   . Hypertension   . Kidney stones   . Ptosis, bilateral    Allergies:  Allergies  Allergen Reactions  . Latex Hives    TONGUE HAD BLISTERS WHEN HAD DENTAL SURGERY  . Amoxicillin Nausea And Vomiting  . Aspirin     Gi upset  . Barbiturates   . Celebrex [Celecoxib]   . Codeine Itching  . Evista [Raloxifene]   . Lipitor [Atorvastatin]   . Morphine And Related   . Oruvail [Ketoprofen]   . Pantoprazole Diarrhea  . Paraffin   . Prochlorperazine Other (See Comments)    Uncontrolled shaking  . Proloprim [Trimethoprim]   . Venlafaxine Nausea Only  . Vibra-Tab [Doxycycline]   . Vioxx [Rofecoxib]     edema  . Betadine [Povidone Iodine] Rash  . Caffeine Palpitations   Review of Systems:  Review of Systems   Constitutional: Positive for malaise/fatigue and weight loss. Negative for chills, diaphoresis and fever.  HENT: Negative for congestion, ear discharge, ear pain, hearing loss, nosebleeds, sore throat and tinnitus.   Eyes: Negative.   Respiratory: Negative.  Negative for stridor.   Cardiovascular: Negative.   Gastrointestinal: Negative.   Genitourinary: Negative.   Musculoskeletal: Positive for back pain and myalgias. Negative for falls, joint pain and neck pain.  Skin: Negative.   Neurological: Positive for headaches. Negative for dizziness, tingling, tremors, sensory change, speech change, focal weakness, seizures, loss of consciousness and weakness.  Psychiatric/Behavioral: Negative.     Family history- Review and unchanged Social history-  Review and unchanged Physical Exam: BP 110/68   Pulse 64   Temp 97.7 F (36.5 C)   Resp 14   Ht 5\' 4"  (1.626 m)   Wt 108 lb 12.8 oz (49.4 kg)   SpO2 96%   BMI 18.68 kg/m  Wt Readings from Last 3 Encounters:  08/12/17 108 lb 12.8 oz (49.4 kg)  08/08/17 110 lb 3.2 oz (50 kg)  07/22/17 108 lb (49 kg)   General Appearance: Thin,frail, in no apparent distress. Eyes: PERRLA, EOMs, conjunctiva no swelling or erythema Sinuses: No Frontal/maxillary tenderness ENT/Mouth: Ext aud canals clear, TMs without erythema, bulging. No erythema, swelling, or exudate on post pharynx.  Tonsils not swollen or erythematous. Hearing normal. + tenderness right temple/head Neck: Supple, thyroid normal.  Respiratory: Respiratory effort normal, BS equal bilaterally without rales, rhonchi, wheezing or stridor.  Cardio: RRR with no MRGs. Brisk peripheral pulses without edema.  Abdomen: Soft, + BS.  Non tender, no guarding, rebound, hernias, masses. Lymphatics: Non tender without lymphadenopathy.  Musculoskeletal: Full ROM, 5/5 strength, normal gait.  Skin: Warm, dry without rashes, lesions, ecchymosis.  Neuro: Cranial nerves intact. Normal muscle tone, no  cerebellar symptoms. Sensation intact.  Psych: Awake and oriented X 3, normal affect, Insight and Judgment appropriate.    Vicie Mutters, PA-C 10:14 AM [facility]

## 2017-08-08 NOTE — Patient Instructions (Addendum)
Can use nystatin and triamcinolone together twice a day for 7-10 days Use more of the nystatin and less of the triamcinolone externally   Vaginitis Vaginitis is an inflammation of the vagina. It can happen when the normal bacteria and yeast in the vagina grow too much. There are different types. Treatment will depend on the type you have. Follow these instructions at home:  Take all medicines as told by your doctor.  Keep your vagina area clean and dry. Avoid soap. Rinse the area with water.  Avoid washing and cleaning out the vagina (douching).  Do not use tampons or have sex (intercourse) until your treatment is done.  Wipe from front to back after going to the restroom.  Wear cotton underwear.  Avoid wearing underwear while you sleep until your vaginitis is gone.  Avoid tight pants. Avoid underwear or nylons without a cotton panel.  Take off wet clothing (such as a bathing suit) as soon as you can.  Use mild, unscented products. Avoid fabric softeners and scented: ? Feminine sprays. ? Laundry detergents. ? Tampons. ? Soaps or bubble baths.  Practice safe sex and use condoms. Get help right away if:  You have belly (abdominal) pain.  You have a fever or lasting symptoms for more than 2-3 days.  You have a fever and your symptoms suddenly get worse. This information is not intended to replace advice given to you by your health care provider. Make sure you discuss any questions you have with your health care provider. Document Released: 09/07/2008 Document Revised: 11/17/2015 Document Reviewed: 11/22/2011 Elsevier Interactive Patient Education  2017 [employer] Inc[employer]    Hematuria, Adult Hematuria is blood in your urine. It can be caused by a bladder infection, kidney infection, prostate infection, kidney stone, or cancer of your urinary tract. Infections can usually be treated with medicine, and a kidney stone usually will pass through your urine. If neither of these is  the cause of your hematuria, further workup to find out the reason may be needed. It is very important that you tell your health care provider about any blood you see in your urine, even if the blood stops without treatment or happens without causing pain. Blood in your urine that happens and then stops and then happens again can be a symptom of a very serious condition. Also, pain is not a symptom in the initial stages of many urinary cancers. Follow these instructions at home:  Drink lots of fluid, 3-4 quarts a day. If you have been diagnosed with an infection, cranberry juice is especially recommended, in addition to large amounts of water.  Avoid caffeine, tea, and carbonated beverages because they tend to irritate the bladder.  Avoid alcohol because it may irritate the prostate.  Take all medicines as directed by your health care provider.  If you were prescribed an antibiotic medicine, finish it all even if you start to feel better.  If you have been diagnosed with a kidney stone, follow your health care provider's instructions regarding straining your urine to catch the stone.  Empty your bladder often. Avoid holding urine for long periods of time.  After a bowel movement, women should cleanse front to back. Use each tissue only once.  Empty your bladder before and after sexual intercourse if you are a female. Contact a health care provider if:  You develop back pain.  You have a fever.  You have a feeling of sickness in your stomach (nausea) or vomiting.  Your symptoms are not  better in 3 days. Return sooner if you are getting worse. Get help right away if:  You develop severe vomiting and are unable to keep the medicine down.  You develop severe back or abdominal pain despite taking your medicines.  You begin passing a large amount of blood or clots in your urine.  You feel extremely weak or faint, or you pass out. This information is not intended to replace advice  given to you by your health care provider. Make sure you discuss any questions you have with your health care provider. Document Released: 06/11/2005 Document Revised: 11/17/2015 Document Reviewed: 02/09/2013 Elsevier Interactive Patient Education  2017 [employer].

## 2017-08-09 LAB — URINALYSIS, ROUTINE W REFLEX MICROSCOPIC
BACTERIA UA: NONE SEEN /HPF
Bilirubin Urine: NEGATIVE
GLUCOSE, UA: NEGATIVE
HYALINE CAST: NONE SEEN /LPF
Ketones, ur: NEGATIVE
Nitrite: NEGATIVE
SQUAMOUS EPITHELIAL / LPF: NONE SEEN /HPF (ref <=5)
Specific Gravity, Urine: 1.004 (ref 1.001–1.03)
WBC, UA: >=60 /HPF — AB (ref 0–5)
pH: 6.5 (ref 5.0–8.0)

## 2017-08-09 LAB — CBC WITH DIFFERENTIAL/PLATELET
BASOS PCT: 0.3 %
Basophils Absolute: 41 cells/uL (ref 0–200)
EOS ABS: 14 {cells}/uL — AB (ref 15–500)
Eosinophils Relative: 0.1 %
HCT: 35 % (ref 35.0–45.0)
Hemoglobin: 12.1 g/dL (ref 11.7–15.5)
LYMPHS ABS: 1242 {cells}/uL (ref 850–3900)
MCH: 30.9 pg (ref 27.0–33.0)
MCHC: 34.6 g/dL (ref 32.0–36.0)
MCV: 89.3 fL (ref 80.0–100.0)
MPV: 10.8 fL (ref 7.5–12.5)
Monocytes Relative: 8.8 %
NEUTROS PCT: 81.8 %
Neutro Abs: 11288 cells/uL — ABNORMAL HIGH (ref 1500–7800)
Platelets: 215 10*3/uL (ref 140–400)
RBC: 3.92 10*6/uL (ref 3.80–5.10)
RDW: 11.9 % (ref 11.0–15.0)
Total Lymphocyte: 9 %
WBC: 13.8 10*3/uL — AB (ref 3.8–10.8)
WBCMIX: 1214 {cells}/uL — AB (ref 200–950)

## 2017-08-09 LAB — BASIC METABOLIC PANEL WITH GFR
BUN: 9 mg/dL (ref 7–25)
CALCIUM: 8.9 mg/dL (ref 8.6–10.4)
CO2: 29 mmol/L (ref 20–32)
Chloride: 91 mmol/L — ABNORMAL LOW (ref 98–110)
Creat: 0.74 mg/dL (ref 0.60–0.88)
GFR, EST AFRICAN AMERICAN: 88 mL/min/{1.73_m2} (ref >=60)
GFR, Est Non African American: 76 mL/min/{1.73_m2} (ref >=60)
Glucose, Bld: 89 mg/dL (ref 65–99)
Potassium: 4.8 mmol/L (ref 3.5–5.3)
Sodium: 128 mmol/L — ABNORMAL LOW (ref 135–146)

## 2017-08-09 LAB — HEPATIC FUNCTION PANEL
AG Ratio: 1.7 (calc) (ref 1.0–2.5)
ALBUMIN MSPROF: 4.3 g/dL (ref 3.6–5.1)
ALT: 14 U/L (ref 6–29)
AST: 20 U/L (ref 10–35)
Alkaline phosphatase (APISO): 72 U/L (ref 33–130)
BILIRUBIN DIRECT: 0.2 mg/dL (ref 0.0–0.2)
BILIRUBIN TOTAL: 0.9 mg/dL (ref 0.2–1.2)
GLOBULIN: 2.5 g/dL (ref 1.9–3.7)
Indirect Bilirubin: 0.7 mg/dL (calc) (ref 0.2–1.2)
Total Protein: 6.8 g/dL (ref 6.1–8.1)

## 2017-08-11 LAB — URINE CULTURE
MICRO NUMBER: 90200994
SPECIMEN QUALITY:: ADEQUATE

## 2017-08-12 ENCOUNTER — Ambulatory Visit (INDEPENDENT_AMBULATORY_CARE_PROVIDER_SITE_OTHER): Payer: PPO | Admitting: Physician Assistant

## 2017-08-12 ENCOUNTER — Encounter: Payer: Self-pay | Admitting: Physician Assistant

## 2017-08-12 VITALS — BP 110/68 | HR 64 | Temp 97.7°F | Resp 14 | Ht 64.0 in | Wt 108.8 lb

## 2017-08-12 DIAGNOSIS — S02402S Zygomatic fracture, unspecified, sequela: Secondary | ICD-10-CM

## 2017-08-12 DIAGNOSIS — I771 Stricture of artery: Secondary | ICD-10-CM

## 2017-08-12 DIAGNOSIS — Z79899 Other long term (current) drug therapy: Secondary | ICD-10-CM

## 2017-08-12 DIAGNOSIS — J439 Emphysema, unspecified: Secondary | ICD-10-CM

## 2017-08-12 DIAGNOSIS — I700 Atherosclerosis of aorta: Secondary | ICD-10-CM

## 2017-08-12 DIAGNOSIS — G501 Atypical facial pain: Secondary | ICD-10-CM | POA: Diagnosis not present

## 2017-08-12 DIAGNOSIS — E782 Mixed hyperlipidemia: Secondary | ICD-10-CM | POA: Diagnosis not present

## 2017-08-12 DIAGNOSIS — I10 Essential (primary) hypertension: Secondary | ICD-10-CM

## 2017-08-12 DIAGNOSIS — G44329 Chronic post-traumatic headache, not intractable: Secondary | ICD-10-CM

## 2017-08-12 DIAGNOSIS — E441 Mild protein-calorie malnutrition: Secondary | ICD-10-CM

## 2017-08-12 LAB — CBC WITH DIFFERENTIAL/PLATELET
BASOS ABS: 46 {cells}/uL (ref 0–200)
Basophils Relative: 0.6 %
EOS ABS: 92 {cells}/uL (ref 15–500)
EOS PCT: 1.2 %
HEMATOCRIT: 35.8 % (ref 35.0–45.0)
HEMOGLOBIN: 12.2 g/dL (ref 11.7–15.5)
LYMPHS ABS: 1532 {cells}/uL (ref 850–3900)
MCH: 30.5 pg (ref 27.0–33.0)
MCHC: 34.1 g/dL (ref 32.0–36.0)
MCV: 89.5 fL (ref 80.0–100.0)
MPV: 10.8 fL (ref 7.5–12.5)
Monocytes Relative: 11 %
NEUTROS ABS: 5182 {cells}/uL (ref 1500–7800)
NEUTROS PCT: 67.3 %
Platelets: 248 10*3/uL (ref 140–400)
RBC: 4 10*6/uL (ref 3.80–5.10)
RDW: 11.9 % (ref 11.0–15.0)
Total Lymphocyte: 19.9 %
WBC: 7.7 10*3/uL (ref 3.8–10.8)
WBCMIX: 847 {cells}/uL (ref 200–950)

## 2017-08-12 LAB — BASIC METABOLIC PANEL WITH GFR
BUN: 8 mg/dL (ref 7–25)
CALCIUM: 9.5 mg/dL (ref 8.6–10.4)
CHLORIDE: 98 mmol/L (ref 98–110)
CO2: 33 mmol/L — AB (ref 20–32)
Creat: 0.79 mg/dL (ref 0.60–0.88)
GFR, EST AFRICAN AMERICAN: 81 mL/min/{1.73_m2} (ref >=60)
GFR, EST NON AFRICAN AMERICAN: 70 mL/min/{1.73_m2} (ref >=60)
Glucose, Bld: 86 mg/dL (ref 65–99)
POTASSIUM: 4.5 mmol/L (ref 3.5–5.3)
SODIUM: 134 mmol/L — AB (ref 135–146)

## 2017-08-12 LAB — LIPID PANEL
CHOL/HDL RATIO: 2.1 (calc) (ref <5.0)
Cholesterol: 178 mg/dL (ref <200)
HDL: 83 mg/dL (ref >50)
LDL Cholesterol (Calc): 72 mg/dL (calc)
NON-HDL CHOLESTEROL (CALC): 95 mg/dL (ref <130)
Triglycerides: 145 mg/dL (ref <150)

## 2017-08-12 LAB — MAGNESIUM: MAGNESIUM: 2 mg/dL (ref 1.5–2.5)

## 2017-08-12 MED ORDER — TRAMADOL HCL 50 MG PO TABS: ORAL_TABLET | ORAL | 1 refills | Status: DC

## 2017-08-12 NOTE — Patient Instructions (Signed)
Hyponatremia Hyponatremia is when the amount of salt (sodium) in your blood is too low. When sodium levels are low, your cells absorb extra water and they swell. The swelling happens throughout the body, but it mostly affects the brain. What are the causes? This condition may be caused by:  Thyroid problems.  Severe vomiting and diarrhea.  Certain medicines or illegal drugs.  Dehydration.  Drinking too much water.  Eating a diet that is low in sodium.  Sweating.  What increases the risk? This condition is more likely to develop in people who:  Have a medical condition that causes frequent or excessive diarrhea.  Have metabolic conditions, such as Addison disease or SIADH.  Take certain medicines that affect the sodium and fluid balance in the blood. Some of these medicine types include: ? Diuretics. ? NSAIDs. ? Some opioid pain medicines. ? Some antidepressants. ? Some seizure prevention medicines.  What are the signs or symptoms? Symptoms of this condition include:  Nausea and vomiting.  Confusion.  Lethargy.  Agitation.  Headache.  Seizures.  Unconsciousness.  Appetite loss.  Muscle weakness and cramping.  Feeling weak or light-headed.  Having a rapid heart rate.  Fainting, in severe cases.  How is this diagnosed? This condition is diagnosed with a medical history and physical exam. You will also have other tests, including:  Blood tests.  Urine tests.  How is this treated? Treatment for this condition depends on the cause. Treatment may include:  Fluids given through an IV tube that is inserted into one of your veins.  Medicines to correct the sodium imbalance. If medicines are causing the condition, the medicines will need to be adjusted.  Limiting water or fluid intake to get the correct sodium balance.  Follow these instructions at home:  Take medicines only as directed by your health care provider. Many medicines can make this  condition worse. Talk with your health care provider about any medicines that you are currently taking.  Carefully follow a recommended diet as directed by your health care provider.  Carefully follow instructions from your health care provider about fluid restrictions.  Keep all follow-up visits as directed by your health care provider. This is important.  Do not drink alcohol. Contact a health care provider if:  You develop worsening nausea, fatigue, headache, confusion, or weakness.  Your symptoms go away and then return.  You have problems following the recommended diet. Get help right away if:  You have a seizure.  You faint.  You have ongoing diarrhea or vomiting. This information is not intended to replace advice given to you by your health care provider. Make sure you discuss any questions you have with your health care provider. Document Released: 06/01/2002 Document Revised: 11/17/2015 Document Reviewed: 07/01/2014 Elsevier Interactive Patient Education  [employer].

## 2017-08-15 DIAGNOSIS — M25561 Pain in right knee: Secondary | ICD-10-CM | POA: Diagnosis not present

## 2017-08-23 DIAGNOSIS — M25561 Pain in right knee: Secondary | ICD-10-CM | POA: Diagnosis not present

## 2017-08-29 DIAGNOSIS — M25561 Pain in right knee: Secondary | ICD-10-CM | POA: Diagnosis not present

## 2017-08-30 ENCOUNTER — Ambulatory Visit
Admission: RE | Admit: 2017-08-30 | Discharge: 2017-08-30 | Disposition: A | Discharge status: Home / Self Care | Payer: PPO | Source: Ambulatory Visit | Attending: Internal Medicine | Admitting: Internal Medicine

## 2017-08-30 DIAGNOSIS — Z1231 Encounter for screening mammogram for malignant neoplasm of breast: Secondary | ICD-10-CM | POA: Diagnosis not present

## 2017-09-08 ENCOUNTER — Emergency Department (HOSPITAL_COMMUNITY)
Admission: EM | Admit: 2017-09-08 | Discharge: 2017-09-09 | Disposition: A | Discharge status: Home / Self Care | Payer: PPO | Attending: Emergency Medicine | Admitting: Emergency Medicine

## 2017-09-08 ENCOUNTER — Emergency Department (HOSPITAL_COMMUNITY): Payer: PPO

## 2017-09-08 DIAGNOSIS — K5730 Diverticulosis of large intestine without perforation or abscess without bleeding: Secondary | ICD-10-CM | POA: Diagnosis not present

## 2017-09-08 DIAGNOSIS — R1013 Epigastric pain: Secondary | ICD-10-CM | POA: Diagnosis not present

## 2017-09-08 DIAGNOSIS — Z91040 Latex allergy status: Secondary | ICD-10-CM | POA: Insufficient documentation

## 2017-09-08 DIAGNOSIS — I129 Hypertensive chronic kidney disease with stage 1 through stage 4 chronic kidney disease, or unspecified chronic kidney disease: Secondary | ICD-10-CM | POA: Insufficient documentation

## 2017-09-08 DIAGNOSIS — R0789 Other chest pain: Secondary | ICD-10-CM | POA: Diagnosis not present

## 2017-09-08 DIAGNOSIS — R079 Chest pain, unspecified: Secondary | ICD-10-CM | POA: Diagnosis present

## 2017-09-08 DIAGNOSIS — J449 Chronic obstructive pulmonary disease, unspecified: Secondary | ICD-10-CM | POA: Diagnosis not present

## 2017-09-08 DIAGNOSIS — N189 Chronic kidney disease, unspecified: Secondary | ICD-10-CM | POA: Insufficient documentation

## 2017-09-08 DIAGNOSIS — J984 Other disorders of lung: Secondary | ICD-10-CM | POA: Diagnosis not present

## 2017-09-08 DIAGNOSIS — R110 Nausea: Secondary | ICD-10-CM | POA: Diagnosis not present

## 2017-09-08 LAB — HEPATIC FUNCTION PANEL
ALK PHOS: 60 U/L (ref 38–126)
ALT: 22 U/L (ref 14–54)
AST: 27 U/L (ref 15–41)
Albumin: 3.5 g/dL (ref 3.5–5.0)
BILIRUBIN TOTAL: 0.4 mg/dL (ref 0.3–1.2)
Bilirubin, Direct: <0.1 mg/dL — ABNORMAL LOW (ref 0.1–0.5)
TOTAL PROTEIN: 6.2 g/dL — AB (ref 6.5–8.1)

## 2017-09-08 LAB — I-STAT TROPONIN, ED
Troponin i, poc: 0 ng/mL (ref 0.00–0.08)
Troponin i, poc: 0 ng/mL (ref 0.00–0.08)

## 2017-09-08 LAB — BASIC METABOLIC PANEL
Anion gap: 10 (ref 5–15)
BUN: 12 mg/dL (ref 6–20)
CALCIUM: 8.4 mg/dL — AB (ref 8.9–10.3)
CHLORIDE: 94 mmol/L — AB (ref 101–111)
CO2: 26 mmol/L (ref 22–32)
CREATININE: 0.7 mg/dL (ref 0.44–1.00)
GFR calc Af Amer: >60 mL/min (ref >60)
GFR calc non Af Amer: >60 mL/min (ref >60)
GLUCOSE: 102 mg/dL — AB (ref 65–99)
Potassium: 3.7 mmol/L (ref 3.5–5.1)
Sodium: 130 mmol/L — ABNORMAL LOW (ref 135–145)

## 2017-09-08 LAB — LIPASE, BLOOD: Lipase: 42 U/L (ref 11–51)

## 2017-09-08 LAB — CBC
HEMATOCRIT: 34.7 % — AB (ref 36.0–46.0)
HEMOGLOBIN: 11.4 g/dL — AB (ref 12.0–15.0)
MCH: 30.6 pg (ref 26.0–34.0)
MCHC: 32.9 g/dL (ref 30.0–36.0)
MCV: 93.3 fL (ref 78.0–100.0)
Platelets: 255 10*3/uL (ref 150–400)
RBC: 3.72 MIL/uL — ABNORMAL LOW (ref 3.87–5.11)
RDW: 13.1 % (ref 11.5–15.5)
WBC: 6.7 10*3/uL (ref 4.0–10.5)

## 2017-09-08 LAB — CBG MONITORING, ED: Glucose-Capillary: 70 mg/dL (ref 65–99)

## 2017-09-08 MED ORDER — FAMOTIDINE 20 MG PO TABS: 20.0000 mg | ORAL_TABLET | Freq: Two times a day (BID) | ORAL | 0 refills | Status: DC

## 2017-09-08 MED ORDER — SODIUM CHLORIDE 0.9 % IV BOLUS (SEPSIS)
1000.0000 mL | Freq: Once | INTRAVENOUS | Status: AC
  Administered 2017-09-08: 1000 mL via INTRAVENOUS

## 2017-09-08 MED ORDER — DICYCLOMINE HCL 20 MG PO TABS: 20.0000 mg | ORAL_TABLET | Freq: Two times a day (BID) | ORAL | 0 refills | Status: DC

## 2017-09-08 MED ORDER — FAMOTIDINE 20 MG PO TABS
20.0000 mg | ORAL_TABLET | Freq: Once | ORAL | Status: AC
Start: 2017-09-08 — End: 2017-09-08
  Administered 2017-09-08: 20 mg via ORAL
  Filled 2017-09-08: qty 1

## 2017-09-08 MED ORDER — DICYCLOMINE HCL 10 MG PO CAPS
10.0000 mg | ORAL_CAPSULE | Freq: Once | ORAL | Status: AC
  Administered 2017-09-08: 10 mg via ORAL
  Filled 2017-09-08: qty 1

## 2017-09-08 MED ORDER — GI COCKTAIL ~~LOC~~
30.0000 mL | Freq: Once | ORAL | Status: AC
  Administered 2017-09-08: 30 mL via ORAL
  Filled 2017-09-08: qty 30

## 2017-09-08 MED ORDER — IOPAMIDOL (ISOVUE-300) INJECTION 61%
INTRAVENOUS | Status: AC
  Administered 2017-09-08: 100 mL
  Filled 2017-09-08: qty 100

## 2017-09-08 MED ORDER — ONDANSETRON 4 MG PO TBDP
4.0000 mg | ORAL_TABLET | Freq: Once | ORAL | Status: AC
  Administered 2017-09-08: 4 mg via ORAL
  Filled 2017-09-08: qty 1

## 2017-09-08 MED ORDER — FENTANYL CITRATE (PF) 100 MCG/2ML IJ SOLN
50.0000 ug | Freq: Once | INTRAMUSCULAR | Status: AC
  Administered 2017-09-08: 50 ug via INTRAVENOUS
  Filled 2017-09-08: qty 2

## 2017-09-08 NOTE — ED Provider Notes (Signed)
Emergency Department Provider Note   I have reviewed the triage vital signs and the nursing notes.   HISTORY  Chief Complaint Chest Pain   HPI Debra White is a 84 y.o. female history of multiple GI issues the presents to the emergency department today secondary to epigastric abdominal pain that seems to radiate to both ribs.  Seems to be worse with eating.  Not made worse by breathing.  States it is similar to previous episodes of indigestion in the past.  On review of records it does appear that she is seeing gastroenterology in the past for irritable bowel syndrome.  Recent colonoscopy was just a polyp but no other abnormalities.  Had been tried with Bentyl.  Shortness of breath has had some nausea and vomiting.  Also with intermittent diarrhea which seems to help the symptoms improved some.  Been going on for a few days intermittently. No other associated or modifying symptoms.    Past Medical History:  Diagnosis Date  . Anxiety   . Arthritis   . Blood transfusion   . C. difficile colitis   . Candida esophagitis ([place]) 2013   EGD  . Cataract   . Chronic kidney disease    STONES  . Diverticulitis 04/05/2017  . Diverticulosis of colon (without mention of hemorrhage) 2007   Colonoscopy  . Dysrhythmia    RX  . Emphysema of lung ([place])   . Family history of colon cancer    sister  . Fibromyalgia   . Fracture, zygoma closed ([place]) 07/13/2011  . GERD (gastroesophageal reflux disease)   . Headache(784.0)   . Hyperlipemia   . Hypertension   . Kidney stones   . Ptosis, bilateral     Patient Active Problem List   Diagnosis Date Noted  . Atherosclerosis of aorta ([place]) 08/08/2017  . COPD (chronic obstructive pulmonary disease) with emphysema ([place]) 12/21/2015  . 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 Osteoporosis 12/21/2015  . Mild malnutrition ([place]) 05/11/2015  . Encounter for Medicare annual wellness exam 04/08/2015  . Medication management 01/27/2014  . Atypical facial  pain 10/08/2013  . Vitamin D deficiency 07/08/2013  . Hypertension   . Hyperlipidemia   . GERD   . Headache, post-traumatic, chronic 09/20/2011    Past Surgical History:  Procedure Laterality Date  . ABDOMINAL HYSTERECTOMY    . BACK SURGERY     X2  . BACK SURGERY    . BREAST EXCISIONAL BIOPSY    . CERVICAL DISC SURGERY    . COLONOSCOPY    . FACIAL FRACTURE SURGERY    . HAND SURGERY     BIL   . ORIF TRIPOD FRACTURE  07/13/2011   Procedure: OPEN REDUCTION INTERNAL FIXATION (ORIF) TRIPOD FRACTURE;  Surgeon: Tyson Alias, MD;  Location: [facility];  Service: ENT;  Laterality: Right;  ORIF RIGHT ZYGOMA, ORBITAL FLOOR EXPLORATION WITH FROST STITCH (TEMPORARY TARSORRHAPHY)  . PTOSIS REPAIR Bilateral 11/22/2016   Procedure: INTERNAL PTOSIS REPAIR;  Surgeon: Clista Bernhardt, MD;  Location: [facility];  Service: Ophthalmology;  Laterality: Bilateral;  . SHOULDER ARTHROSCOPY W/ ROTATOR CUFF REPAIR     LFT    Current Outpatient Rx  . Order #: [id number] Class: Historical Med  . Order #: [id number] Class: Normal  . Order #: [id number] Class: Historical Med  . Order #: [id number] Class: Print  . Order #: [id number] Class: Print  . Order #: [id number] Class: Print  . Order #: [id number] Class: Normal  . Order #: [id number] Class: Normal  . Order #: [id number] Class: Normal  .  Order #: [id number] Class: Normal  . Order #: [id number] Class: Normal  . Order #: [id number] Class: Normal  . Order #: [id number] Class: Normal    Allergies Latex; Amoxicillin; Aspirin; Barbiturates; Celebrex [celecoxib]; Codeine; Evista [raloxifene]; Lipitor [atorvastatin]; Morphine and related; Oruvail [ketoprofen]; Pantoprazole; Paraffin; Prochlorperazine; Proloprim [trimethoprim]; Venlafaxine; Vibra-tab [doxycycline]; Vioxx [rofecoxib]; Betadine [povidone iodine]; and Caffeine  Family History  Problem Relation Age of Onset  . Heart disease Mother   . Heart disease Father   . Breast cancer Sister 32  . Colon cancer Sister   . Cancer  Sister        breast  . Cancer Sister        colon  . Cancer Sister        colon  . Hypertension Daughter   . Hyperlipidemia Daughter     Social History Social History   Tobacco Use  . Smoking status: Never Smoker  . Smokeless tobacco: Never Used  Substance Use Topics  . Alcohol use: No  . Drug use: No    Review of Systems  All other systems negative except as documented in the HPI. All pertinent positives and negatives as reviewed in the HPI. ____________________________________________   PHYSICAL EXAM:  VITAL SIGNS: ED Triage Vitals  Enc Vitals Group     BP 09/08/17 1918 (!) 151/84     Pulse Rate 09/08/17 1918 61     Resp 09/08/17 1918 15     Temp 09/08/17 1918 98.9 F (37.2 C)     Temp Source 09/08/17 1918 Oral     SpO2 09/08/17 1910 97 %    Constitutional: Alert and oriented. Well appearing and in no acute distress. Eyes: Conjunctivae are normal. PERRL. EOMI. Head: Atraumatic. Nose: No congestion/rhinnorhea. Mouth/Throat: Mucous membranes are moist.  Oropharynx non-erythematous. Neck: No stridor.  No meningeal signs.   Cardiovascular: Normal rate, regular rhythm. Good peripheral circulation. Grossly normal heart sounds.   Respiratory: Normal respiratory effort.  No retractions. Lungs CTAB. Gastrointestinal: Soft and nontender. No distention.  Musculoskeletal: No lower extremity tenderness nor edema. No gross deformities of extremities. Neurologic:  Normal speech and language. No gross focal neurologic deficits are appreciated.  Skin:  Skin is warm, dry and intact. No rash noted.   ____________________________________________   LABS (all labs ordered are listed, but only abnormal results are displayed)  Labs Reviewed  BASIC METABOLIC PANEL - Abnormal; Notable for the following components:      Result Value   Sodium 130 (*)    Chloride 94 (*)    Glucose, Bld 102 (*)    Calcium 8.4 (*)    All other components within normal limits  CBC - Abnormal;  Notable for the following components:   RBC 3.72 (*)    Hemoglobin 11.4 (*)    HCT 34.7 (*)    All other components within normal limits  HEPATIC FUNCTION PANEL - Abnormal; Notable for the following components:   Total Protein 6.2 (*)    Bilirubin, Direct <0.1 (*)    All other components within normal limits  LIPASE, BLOOD  CBG MONITORING, ED  I-STAT TROPONIN, ED  I-STAT TROPONIN, ED   ____________________________________________  EKG   EKG Interpretation  Date/Time:  Sunday September 08 2017 19:12:51 EDT Ventricular Rate:  62 PR Interval:    QRS Duration: 108 QT Interval:  424 QTC Calculation: 428 R Axis:   7 Text Interpretation:  Sinus rhythm Atrial premature complex Incomplete left bundle branch block Low voltage, precordial leads No significant change since last tracing  Confirmed by Merrily Pew [phone]) on 09/08/2017 7:19:53 PM       ____________________________________________  RADIOLOGY  Dg Chest 2 View  Result Date: 09/08/2017 CLINICAL DATA:  84 year old female with chest and abdominal pain since yesterday. Vomiting. EXAM: CHEST - 2 VIEW COMPARISON:  Chest radiographs 11/10/2016 and earlier. FINDINGS: Lower lung volumes. No pneumothorax or pneumoperitoneum. Negative visible bowel gas pattern. Stable mild cardiomegaly. Other mediastinal contours are within normal limits. Visualized tracheal air column is within normal limits. No pulmonary edema, pleural effusion or confluent pulmonary opacity. Osteopenia. No acute osseous abnormality identified. IMPRESSION: Lower lung volumes.  No acute cardiopulmonary abnormality. Electronically Signed   By: Genevie Ann M.D.   On: 09/08/2017 20:35   Ct Abdomen Pelvis W Contrast  Result Date: 09/08/2017 CLINICAL DATA:  Pain with indigestion and belching EXAM: CT ABDOMEN AND PELVIS WITH CONTRAST TECHNIQUE: Multidetector CT imaging of the abdomen and pelvis was performed using the standard protocol following bolus administration of intravenous  contrast. CONTRAST:  16mL ISOVUE-300 IOPAMIDOL (ISOVUE-300) INJECTION 61% COMPARISON:  April 05, 2017 FINDINGS: Lower chest: There is bibasilar atelectasis. No lung base edema or consolidation. Hepatobiliary: Liver measures 20.2 cm in length. There is hepatic steatosis. There is evidence of periportal edema. No focal liver lesions are evident. The gallbladder wall is not appreciably thickened. Pancreas: There is no appreciable pancreatic mass or inflammatory focus. Spleen: No splenic lesions are evident. Adrenals/Urinary Tract: Adrenals bilaterally appear unremarkable. There is scarring along the left kidney laterally. There there is a 1 cm cyst in the lateral mid left kidney. There is no evident hydronephrosis on either side. There is no renal or ureteral calculus on either side. Urinary bladder is midline with wall thickness within normal limits. Stomach/Bowel: There are multiple sigmoid diverticula. There is wall thickening in the sigmoid colon, also present on prior study, felt to represent muscular hypertrophy from chronic diverticulosis. There is no frank diverticulitis currently appreciable on this study. No bowel obstruction evident. No free air or portal venous air. Vascular/Lymphatic: There is atherosclerotic calcification in the aorta and common iliac arteries. There is also calcification in both internal iliac arteries. No aneurysm. Major mesenteric arterial vessels appear patent. There is a circumaortic left renal vein, an anatomic variant. There is no appreciable adenopathy in the abdomen or pelvis. Reproductive: Uterus absent.  No pelvic mass evident. Other: There is no periappendiceal region inflammation. No abscess or ascites is evident in the abdomen or pelvis. Musculoskeletal: There is evidence of postoperative change in lumbar spine inferiorly with remodeling. There is ankylosis at L4-5 with spondylolisthesis of L4 on L5, stable. There are no blastic or lytic bone lesions. No intramuscular or  abdominal wall lesion evident. IMPRESSION: 1. Prominent liver with hepatic steatosis. There is a degree of periportal edema. Question underlying parenchymal liver disease. Appropriate laboratory correlation in this regard advised. 2. Multiple sigmoid diverticula. Muscular hypertrophy in the sigmoid region most likely resulting from chronic diverticulosis. No frank diverticulitis currently evident. No bowel obstruction. No abscess. No periappendiceal region inflammatory change. 3.  No renal or ureteral calculus.  No hydronephrosis. 4. Extensive postoperative change in the lumbar spine, stable. Spondylolisthesis and partial ankylosis at L4-5, stable. 5.  Aortoiliac atherosclerosis. Aortic Atherosclerosis (ICD10-I70.0). Electronically Signed   By: Lowella Grip III M.D.   On: 09/08/2017 22:35    ____________________________________________   PROCEDURES  Procedure(s) performed:   Procedures   ____________________________________________   INITIAL IMPRESSION / ASSESSMENT AND PLAN / ED COURSE  Seems more GI in nature especially with  extensive GI history and similarity in the symptoms.  Will start GI cocktail, PPI and H2 blocker with some pain medicine and Bentyl.  CT scan to ensure no complications as she does have a history of diverticulitis.  If all this is normal patient is likely stable for discharge.  I think is unlikely to be ACS at this time with normal EKG and negative troponin and a very atypical story.  CT ok. Symptoms improved. Will dc to follow up with GI.   Pertinent labs & imaging results that were available during my care of the patient were reviewed by me and considered in my medical decision making (see chart for details).  ____________________________________________  FINAL CLINICAL IMPRESSION(S) / ED DIAGNOSES  Final diagnoses:  Epigastric pain     MEDICATIONS GIVEN DURING THIS VISIT:  Medications  ondansetron (ZOFRAN-ODT) disintegrating tablet 4 mg (4 mg Oral  Given 09/08/17 2100)  gi cocktail (Maalox,Lidocaine,Donnatal) (30 mLs Oral Given 09/08/17 2101)  famotidine (PEPCID) tablet 20 mg (20 mg Oral Given 09/08/17 2102)  dicyclomine (BENTYL) capsule 10 mg (10 mg Oral Given 09/08/17 2102)  fentaNYL (SUBLIMAZE) injection 50 mcg (50 mcg Intravenous Given 09/08/17 2252)  sodium chloride 0.9 % bolus 1,000 mL (0 mLs Intravenous Stopped 09/08/17 2135)  iopamidol (ISOVUE-300) 61 % injection (100 mLs  Contrast Given 09/08/17 2144)     NEW OUTPATIENT MEDICATIONS STARTED DURING THIS VISIT:  New Prescriptions   DICYCLOMINE (BENTYL) 20 MG TABLET    Take 1 tablet (20 mg total) by mouth 2 (two) times daily.   FAMOTIDINE (PEPCID) 20 MG TABLET    Take 1 tablet (20 mg total) by mouth 2 (two) times daily.    Note:  This note was prepared with assistance of Dragon voice recognition software. Occasional wrong-word or sound-a-like substitutions may have occurred due to the inherent limitations of voice recognition software.   Merrily Pew, MD 09/08/17 [phone]

## 2017-09-08 NOTE — ED Notes (Signed)
Provider aware of patient's CBG 70.

## 2017-09-08 NOTE — ED Triage Notes (Addendum)
To ED via [facility] from home with c/o chest pain, left breast pain- radiates between shoulder blades- with "bad indigestion" belching in ED.  Pt states that she did take a zantac this afternoon

## 2017-09-08 NOTE — ED Notes (Signed)
Pt Alert and oriented x4. Skin warm and dry. Respirations equal and unlabored. s1 and s2 heart sounds audible. NSR to cardiac monitor. Pt cc of chest pain associated with n/v/d. Pt ambulatory with steady gait. VSS. Pupils equal and reactive to light. Abdomen soft and non distended. Sitting with family at bedside. Assisted to bathroom.

## 2017-09-08 NOTE — ED Notes (Signed)
Patient transported to CT 

## 2017-09-09 ENCOUNTER — Ambulatory Visit (INDEPENDENT_AMBULATORY_CARE_PROVIDER_SITE_OTHER): Payer: PPO | Admitting: Internal Medicine

## 2017-09-09 ENCOUNTER — Encounter: Payer: Self-pay | Admitting: Internal Medicine

## 2017-09-09 VITALS — BP 112/58 | HR 60 | Temp 97.5°F | Resp 16 | Ht 64.0 in | Wt 114.0 lb

## 2017-09-09 DIAGNOSIS — R12 Heartburn: Secondary | ICD-10-CM

## 2017-09-09 NOTE — Progress Notes (Signed)
Subjective:    Patient ID: Debra White, female    DOB: January 06, 1934, 84 y.o.   MRN: [id number]  HPI  This delightful 84 yo MWF was evaluated at the ER yesterday for Epigastric discomfort and BMET, HFP,  Lipase, Troponins & CBC were essentially WNL. EKG had no acute changes. Abdominopelvic CT scan was likewise unremarkable except for Hepatic Steatosis. Patient was given Zofran, GI cocktail, Pepcid, Bentyl and Fentanyl 50 mcg IV and then released with new Rx's for Pepcid 20 mg and Bentyl 2o mg bid after al results returned benign.  Outpatient Medications Prior to Visit  Medication Sig  . acetaminophen  500 MG tablet Take 1 to 2 tab every 6  hours as needed   . atenolol (TENORMIN) 100 MG tablet TAKE 1/2 TABLET BY MOUTH EVERY MORNING)  . VITAMIN D 1000 UNITS  Take 4,000 Units by mouth daily.   . clonazePAM (KLONOPIN) 1 MG tablet TAKE 1/2 TO 1 TAB UP TO 3 x / DAILY AS NEEDED   . dicyclomine (BENTYL) 20 MG tablet Take 1 tablet (20 mg total) by mouth 2 (two) times daily.  . . Not Taking. gabapentin  300 MG capsule TAKE 1 CAP 3 TO 4 TIMES DAILY FOR FACIAL PAIN  . losartan (COZAAR) 100 MG tablet TAKE 1/2 TABLET BY MOUTH DAILY  . nystatin ointment (MYCOSTATIN) Apply 1 application topically 2 (two) times daily.  . pravastatin  40 MG tablet TAKE 1 TABLET BY MOUTH DAILY  . ranitidine  300 MG tablet TAKE 1 TAB TWICE DAILY AS NEEDED   . traMADol 50 MG tablet TAKE 1 TAB 4 TIMES DAILY AS NEEDED FOR PAIN  . KENALOG 0.1 %  ointment  Apply 1 application topically 2 (two) times daily.   Allergies  Allergen Reactions  . Latex Hives    TONGUE HAD BLISTERS WHEN HAD DENTAL SURGERY  . Amoxicillin Nausea And Vomiting  . Aspirin     Gi upset  . Barbiturates   . Celebrex [Celecoxib]   . Codeine Itching  . Evista [Raloxifene]   . Lipitor [Atorvastatin]   . Morphine And Related   . Oruvail [Ketoprofen]   . Pantoprazole Diarrhea  . Paraffin   . Prochlorperazine Other (See Comments)    Uncontrolled shaking  .  Proloprim [Trimethoprim]   . Venlafaxine Nausea Only  . Vibra-Tab [Doxycycline]   . Vioxx [Rofecoxib]     edema  . Betadine [Povidone Iodine] Rash  . Caffeine Palpitations   Past Medical History:  Diagnosis Date  . Anxiety   . Arthritis   . Blood transfusion   . C. difficile colitis   . Candida esophagitis ([place]) 2013   EGD  . Cataract   . Chronic kidney disease    STONES  . Diverticulitis 04/05/2017  . Diverticulosis of colon (without mention of hemorrhage) 2007   Colonoscopy  . Dysrhythmia    RX  . Emphysema of lung ([place])   . Family history of colon cancer    sister  . Fibromyalgia   . Fracture, zygoma closed ([place]) 07/13/2011  . GERD (gastroesophageal reflux disease)   . Headache(784.0)   . Hyperlipemia   . Hypertension   . Kidney stones   . Ptosis, bilateral    Review of Systems  10 point systems review negative except as above.         Objective:   Physical Exam   BP (!) 112/58   Pulse 60   Temp (!) 97.5 F (36.4 C)  Resp 16   Ht 5\' 4"  (1.626 m)   Wt 114 lb (51.7 kg)   BMI 19.57 kg/m   HEENT - WNL. Neck - supple.  Chest - Clear equal BS. Cor - Nl HS. RRR w/o sig MGR. PP 1(+). No edema. Abd - Soft with sl EG tenderness w/o G or RB & BS - Nl.  MS- FROM w/o deformities.  Gait Nl. Neuro -  Nl w/o focal abnormalities. Skin - No Rash. Cyanosis, icterus or clubbing.     Assessment & Plan:   1. Heartburn  - advidsed to continue her Zantac 300 mg tabs  2 x/day   - Discussed anti dyspeptic diet in detail.

## 2017-09-09 NOTE — Patient Instructions (Signed)
Heartburn Heartburn is a type of pain or discomfort that can happen in the throat or chest. It is often described as a burning pain. It may also cause a bad taste in the mouth. Heartburn may feel worse when you lie down or bend over, and it is often worse at night. Heartburn may be caused by stomach contents that move back up into the esophagus (reflux). Follow these instructions at home: Take these actions to decrease your discomfort and to help avoid complications. Diet  Follow a diet as recommended by your health care provider. This may involve avoiding foods and drinks such as: ? Coffee and tea (with or without caffeine). ? Drinks that contain alcohol. ? Energy drinks and sports drinks. ? Carbonated drinks or sodas. ? Chocolate and cocoa. ? Peppermint and mint flavorings. ? Garlic and onions. ? Horseradish. ? Spicy and acidic foods, including peppers, chili powder, curry powder, vinegar, hot sauces, and barbecue sauce. ? Citrus fruit juices and citrus fruits, such as oranges, lemons, and limes. ? Tomato-based foods, such as red sauce, chili, salsa, and pizza with red sauce. ? Fried and fatty foods, such as donuts, french fries, potato chips, and high-fat dressings. ? High-fat meats, such as hot dogs and fatty cuts of red and white meats, such as rib eye steak, sausage, ham, and bacon. ? High-fat dairy items, such as whole milk, butter, and cream cheese.  Eat small, frequent meals instead of large meals.  Avoid drinking large amounts of liquid with your meals.  Avoid eating meals during the 2-3 hours before bedtime.  Avoid lying down right after you eat.  Do not exercise right after you eat. General instructions  Pay attention to any changes in your symptoms.  Take over-the-counter and prescription medicines only as told by your health care provider. Do not take aspirin, ibuprofen, or other NSAIDs unless your health care provider told you to do so.  Do not use any tobacco  products, including cigarettes, chewing tobacco, and e-cigarettes. If you need help quitting, ask your health care provider.  Wear loose-fitting clothing. Do not wear anything tight around your waist that causes pressure on your abdomen.  Raise (elevate) the head of your bed about 6 inches (15 cm).  Try to reduce your stress, such as with yoga or meditation. If you need help reducing stress, ask your health care provider.  If you are overweight, reduce your weight to an amount that is healthy for you. Ask your health care provider for guidance about a safe weight loss goal.  Keep all follow-up visits as told by your health care provider. This is important. Contact a health care provider if:  You have new symptoms.  You have unexplained weight loss.  You have difficulty swallowing, or it hurts to swallow.  You have wheezing or a persistent cough.  Your symptoms do not improve with treatment.  You have frequent heartburn for more than two weeks. Get help right away if:  You have pain in your arms, neck, jaw, teeth, or back.  You feel sweaty, dizzy, or light-headed.  You have chest pain or shortness of breath.  You vomit and your vomit looks like blood or coffee grounds.  Your stool is bloody or black. +++++++++++++++++++++++++++++++++++  Gastroesophageal Reflux Disease, Adult Normally, food travels down the esophagus and stays in the stomach to be digested. However, when a person has gastroesophageal reflux disease (GERD), food and stomach acid move back up into the esophagus. When this happens, the esophagus becomes  sore and inflamed. Over time, GERD can create small holes (ulcers) in the lining of the esophagus. What are the causes? This condition is caused by a problem with the muscle between the esophagus and the stomach (lower esophageal sphincter, or LES). Normally, the LES muscle closes after food passes through the esophagus to the stomach. When the LES is weakened or  abnormal, it does not close properly, and that allows food and stomach acid to go back up into the esophagus. The LES can be weakened by certain dietary substances, medicines, and medical conditions, including:  Tobacco use.  Pregnancy.  Having a hiatal hernia.  Heavy alcohol use.  Certain foods and beverages, such as coffee, chocolate, onions, and peppermint.  What increases the risk? This condition is more likely to develop in:  People who have an increased body weight.  People who have connective tissue disorders.  People who use NSAID medicines.  What are the signs or symptoms? Symptoms of this condition include:  Heartburn.  Difficult or painful swallowing.  The feeling of having a lump in the throat.  Abitter taste in the mouth.  Bad breath.  Having a large amount of saliva.  Having an upset or bloated stomach.  Belching.  Chest pain.  Shortness of breath or wheezing.  Ongoing (chronic) cough or a night-time cough.  Wearing away of tooth enamel.  Weight loss.  Different conditions can cause chest pain. Make sure to see your health care provider if you experience chest pain. How is this diagnosed? Your health care provider will take a medical history and perform a physical exam. To determine if you have mild or severe GERD, your health care provider may also monitor how you respond to treatment. You may also have other tests, including:  An endoscopy toexamine your stomach and esophagus with a small camera.  A test thatmeasures the acidity level in your esophagus.  A test thatmeasures how much pressure is on your esophagus.  A barium swallow or modified barium swallow to show the shape, size, and functioning of your esophagus.  How is this treated? The goal of treatment is to help relieve your symptoms and to prevent complications. Treatment for this condition may vary depending on how severe your symptoms are. Your health care provider may  recommend:  Changes to your diet.  Medicine.  Surgery.  Follow these instructions at home: Diet  Follow a diet as recommended by your health care provider. This may involve avoiding foods and drinks such as: ? Coffee and tea (with or without caffeine). ? Drinks that containalcohol. ? Energy drinks and sports drinks. ? Carbonated drinks or sodas. ? Chocolate and cocoa. ? Peppermint and mint flavorings. ? Garlic and onions. ? Horseradish. ? Spicy and acidic foods, including peppers, chili powder, curry powder, vinegar, hot sauces, and barbecue sauce. ? Citrus fruit juices and citrus fruits, such as oranges, lemons, and limes. ? Tomato-based foods, such as red sauce, chili, salsa, and pizza with red sauce. ? Fried and fatty foods, such as donuts, french fries, potato chips, and high-fat dressings. ? High-fat meats, such as hot dogs and fatty cuts of red and white meats, such as rib eye steak, sausage, ham, and bacon. ? High-fat dairy items, such as whole milk, butter, and cream cheese.  Eat small, frequent meals instead of large meals.  Avoid drinking large amounts of liquid with your meals.  Avoid eating meals during the 2-3 hours before bedtime.  Avoid lying down right after you eat.  Do not exercise right after you eat. General instructions  Pay attention to any changes in your symptoms.  Take over-the-counter and prescription medicines only as told by your health care provider. Do not take aspirin, ibuprofen, or other NSAIDs unless your health care provider told you to do so.  Do not use any tobacco products, including cigarettes, chewing tobacco, and e-cigarettes. If you need help quitting, ask your health care provider.  Wear loose-fitting clothing. Do not wear anything tight around your waist that causes pressure on your abdomen.  Raise (elevate) the head of your bed 6 inches (15cm).  Try to reduce your stress, such as with yoga or meditation. If you need help  reducing stress, ask your health care provider.  If you are overweight, reduce your weight to an amount that is healthy for you. Ask your health care provider for guidance about a safe weight loss goal.  Keep all follow-up visits as told by your health care provider. This is important. Contact a health care provider if:  You have new symptoms.  You have unexplained weight loss.  You have difficulty swallowing, or it hurts to swallow.  You have wheezing or a persistent cough.  Your symptoms do not improve with treatment.  You have a hoarse voice. Get help right away if:  You have pain in your arms, neck, jaw, teeth, or back.  You feel sweaty, dizzy, or light-headed.  You have chest pain or shortness of breath.  You vomit and your vomit looks like blood or coffee grounds.  You faint.  Your stool is bloody or black.  You cannot swallow, drink, or eat. ++++++++++++++++++++++++++++++  Food Choices for Gastroesophageal Reflux Disease, Adult When you have gastroesophageal reflux disease (GERD), the foods you eat and your eating habits are very important. Choosing the right foods can help ease the discomfort of GERD. Consider working with a diet and nutrition specialist (dietitian) to help you make healthy food choices. What general guidelines should I follow? Eating plan  Choose healthy foods low in fat, such as fruits, vegetables, whole grains, low-fat dairy products, and lean meat, fish, and poultry.  Eat frequent, small meals instead of three large meals each day. Eat your meals slowly, in a relaxed setting. Avoid bending over or lying down until 2-3 hours after eating.  Limit high-fat foods such as fatty meats or fried foods.  Limit your intake of oils, butter, and shortening to less than 8 teaspoons each day.  Avoid the following: ? Foods that cause symptoms. These may be different for different people. Keep a food diary to keep track of foods that cause  symptoms. ? Alcohol. ? Drinking large amounts of liquid with meals. ? Eating meals during the 2-3 hours before bed.  Cook foods using methods other than frying. This may include baking, grilling, or broiling. Lifestyle   Maintain a healthy weight. Ask your health care provider what weight is healthy for you. If you need to lose weight, work with your health care provider to do so safely.  Exercise for at least 30 minutes on 5 or more days each week, or as told by your health care provider.  Avoid wearing clothes that fit tightly around your waist and chest.  Do not use any products that contain nicotine or tobacco, such as cigarettes and e-cigarettes. If you need help quitting, ask your health care provider.  Sleep with the head of your bed raised. Use a wedge under the mattress or blocks under the bed  frame to raise the head of the bed. What foods are not recommended? The items listed may not be a complete list. Talk with your dietitian about what dietary choices are best for you. Grains Pastries or quick breads with added fat. [place] toast. Vegetables Deep fried vegetables. [place] fries. Any vegetables prepared with added fat. Any vegetables that cause symptoms. For some people this may include tomatoes and tomato products, chili peppers, onions and garlic, and horseradish. Fruits Any fruits prepared with added fat. Any fruits that cause symptoms. For some people this may include citrus fruits, such as oranges, grapefruit, pineapple, and lemons. Meats and other protein foods High-fat meats, such as fatty beef or pork, hot dogs, ribs, ham, sausage, salami and bacon. Fried meat or protein, including fried fish and fried chicken. Nuts and nut butters. Dairy Whole milk and chocolate milk. Sour cream. Cream. Ice cream. Cream cheese. Milk shakes. Beverages Coffee and tea, with or without caffeine. Carbonated beverages. Sodas. Energy drinks. Fruit juice made with acidic fruits (such as  orange or grapefruit). Tomato juice. Alcoholic drinks. Fats and oils Butter. Margarine. Shortening. Ghee. Sweets and desserts Chocolate and cocoa. Donuts. Seasoning and other foods Pepper. Peppermint and spearmint. Any condiments, herbs, or seasonings that cause symptoms. For some people, this may include curry, hot sauce, or vinegar-based salad dressings. Summary  When you have gastroesophageal reflux disease (GERD), food and lifestyle choices are very important to help ease the discomfort of GERD.  Eat frequent, small meals instead of three large meals each day. Eat your meals slowly, in a relaxed setting. Avoid bending over or lying down until 2-3 hours after eating.  Limit high-fat foods such as fatty meat or fried foods. This information is not intended to replace advice given to you by your health care provider. Make sure you discuss any questions you have with your health care provider. Document Released: 06/11/2005 Document Revised: 06/12/2016 Document Reviewed: 06/12/2016 Elsevier Interactive Patient Education  [employer].

## 2017-09-18 ENCOUNTER — Other Ambulatory Visit: Payer: Self-pay | Admitting: Physician Assistant

## 2017-09-23 DIAGNOSIS — M1711 Unilateral primary osteoarthritis, right knee: Secondary | ICD-10-CM | POA: Diagnosis not present

## 2017-09-23 DIAGNOSIS — M25561 Pain in right knee: Secondary | ICD-10-CM | POA: Diagnosis not present

## 2017-09-27 ENCOUNTER — Other Ambulatory Visit: Payer: Self-pay | Admitting: Physician Assistant

## 2017-09-30 DIAGNOSIS — M25562 Pain in left knee: Secondary | ICD-10-CM | POA: Diagnosis not present

## 2017-09-30 DIAGNOSIS — M1712 Unilateral primary osteoarthritis, left knee: Secondary | ICD-10-CM | POA: Diagnosis not present

## 2017-09-30 DIAGNOSIS — M1711 Unilateral primary osteoarthritis, right knee: Secondary | ICD-10-CM | POA: Diagnosis not present

## 2017-09-30 DIAGNOSIS — M25561 Pain in right knee: Secondary | ICD-10-CM | POA: Diagnosis not present

## 2017-10-15 DIAGNOSIS — M1711 Unilateral primary osteoarthritis, right knee: Secondary | ICD-10-CM | POA: Diagnosis not present

## 2017-10-22 DIAGNOSIS — M1711 Unilateral primary osteoarthritis, right knee: Secondary | ICD-10-CM | POA: Diagnosis not present

## 2017-10-29 DIAGNOSIS — M25511 Pain in right shoulder: Secondary | ICD-10-CM | POA: Diagnosis not present

## 2017-10-29 DIAGNOSIS — M25461 Effusion, right knee: Secondary | ICD-10-CM | POA: Diagnosis not present

## 2017-10-29 DIAGNOSIS — M1711 Unilateral primary osteoarthritis, right knee: Secondary | ICD-10-CM | POA: Diagnosis not present

## 2017-11-14 ENCOUNTER — Ambulatory Visit (INDEPENDENT_AMBULATORY_CARE_PROVIDER_SITE_OTHER): Payer: PPO | Admitting: Internal Medicine

## 2017-11-14 VITALS — BP 122/76 | HR 56 | Temp 97.3°F | Resp 16 | Ht 63.5 in | Wt 110.0 lb

## 2017-11-14 DIAGNOSIS — R7309 Other abnormal glucose: Secondary | ICD-10-CM | POA: Diagnosis not present

## 2017-11-14 DIAGNOSIS — K219 Gastro-esophageal reflux disease without esophagitis: Secondary | ICD-10-CM

## 2017-11-14 DIAGNOSIS — R7303 Prediabetes: Secondary | ICD-10-CM

## 2017-11-14 DIAGNOSIS — I10 Essential (primary) hypertension: Secondary | ICD-10-CM

## 2017-11-14 DIAGNOSIS — G500 Trigeminal neuralgia: Secondary | ICD-10-CM

## 2017-11-14 DIAGNOSIS — E559 Vitamin D deficiency, unspecified: Secondary | ICD-10-CM

## 2017-11-14 DIAGNOSIS — Z79899 Other long term (current) drug therapy: Secondary | ICD-10-CM | POA: Diagnosis not present

## 2017-11-14 DIAGNOSIS — E782 Mixed hyperlipidemia: Secondary | ICD-10-CM

## 2017-11-14 DIAGNOSIS — Z0000 Encounter for general adult medical examination without abnormal findings: Secondary | ICD-10-CM

## 2017-11-14 DIAGNOSIS — Z0001 Encounter for general adult medical examination with abnormal findings: Secondary | ICD-10-CM

## 2017-11-14 DIAGNOSIS — Z136 Encounter for screening for cardiovascular disorders: Secondary | ICD-10-CM | POA: Diagnosis not present

## 2017-11-14 DIAGNOSIS — I700 Atherosclerosis of aorta: Secondary | ICD-10-CM

## 2017-11-14 DIAGNOSIS — K760 Fatty (change of) liver, not elsewhere classified: Secondary | ICD-10-CM

## 2017-11-14 DIAGNOSIS — Z1212 Encounter for screening for malignant neoplasm of rectum: Secondary | ICD-10-CM

## 2017-11-14 DIAGNOSIS — Z1211 Encounter for screening for malignant neoplasm of colon: Secondary | ICD-10-CM

## 2017-11-14 DIAGNOSIS — Z8249 Family history of ischemic heart disease and other diseases of the circulatory system: Secondary | ICD-10-CM

## 2017-11-14 MED ORDER — GABAPENTIN 600 MG PO TABS: ORAL_TABLET | ORAL | 3 refills | Status: DC

## 2017-11-14 NOTE — Progress Notes (Signed)
[facility] Unk Pinto, M.D.     Uvaldo Bristle. Silverio Lay, P.A.-C Liane [address], [place], [place] [address] Telephone [phone] Telefax [phone] Annual Screening/Preventative Visit & Comprehensive Evaluation &  Examination     This very nice 84 y.o. MWF  presents for a Screening/Preventative Visit & comprehensive evaluation and management of multiple medical co-morbidities.  Patient has been followed for HTN, HLD, Prediabetes  and Vitamin D Deficiency. Patient also has GERD controlled w/Ranitidine. Other problems include chronic anxiety. 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 Patient's pain persists and is controlled with Gabapentin.      HTN predates circa 1990. Patient's BP has been controlled at home and patient denies any cardiac symptoms as chest pain, palpitations, shortness of breath, dizziness or ankle swelling. Today's BP is at goal - 122/76.      Patient's hyperlipidemia is controlled with diet and medications. Patient denies myalgias or other medication SE's. Last lipids were at goal: Lab Results  Component Value Date   CHOL 193 11/14/2017   HDL 70 11/14/2017   LDLCALC 97 11/14/2017   TRIG 155 (H) 11/14/2017   CHOLHDL 2.8 11/14/2017      Patient has Hx/o T2-DM  (A1c 6.8%/2001)   and after a 30# weight loss after her facial fx her  A1c 5.8%/2013 as in the preDiabetic range and then Normal - 5.3% in 2016. Patient denies reactive hypoglycemic symptoms, visual blurring, diabetic polys, or paresthesias. Last A1c was again sl elevated in the preDiabetic range:  Lab Results  Component Value Date   HGBA1C 5.7 (H) 11/14/2017      Finally, patient has history of Vitamin D Deficiency and last Vitamin D was at goal: Lab Results  Component Value Date   VD25OH 77 11/14/2017   Current  Outpatient Medications on File Prior to Visit  Medication Sig  . acetaminophen (TYLENOL) 500 MG tablet Take 500-1,000 mg by mouth every 6 (six) hours as needed for headache.  Marland Kitchen atenolol (TENORMIN) 100 MG tablet Take 1 tablet daily for BP  . cholecalciferol (VITAMIN D) 1000 UNITS tablet Take 4,000 Units by mouth daily.   . clonazePAM (KLONOPIN) 1 MG tablet Take 1/2 to 1 tablet 2 to 3 x / day ONLY if needed for Anxiety Attack  & please try to limit to 5 days /week to avoid addiction  . dicyclomine (BENTYL) 20 MG tablet Take 1 tablet (20 mg total) by mouth 2 (two) times daily.  . famotidine (PEPCID) 20 MG tablet Take 1 tablet (20 mg total) by mouth 2 (two) times daily.  Marland Kitchen losartan (COZAAR) 100 MG tablet TAKE 1/2 TABLET BY MOUTH DAILY  . pravastatin (PRAVACHOL) 40 MG tablet TAKE 1 TABLET BY MOUTH DAILY  . ranitidine (ZANTAC) 300 MG tablet TAKE 1 TABLET BY MOUTH TWICE DAILY AS NEEDED FOR ACID INDIGESTION ANDREFLUX  . traMADol (ULTRAM) 50 MG tablet TAKE 1 TABLET BY MOUTH 4 TIMES DAILY AS NEEDED FOR PAIN  . [DISCONTINUED] omeprazole (PRILOSEC OTC) 20 MG tablet Take 20 mg by mouth daily.   No current facility-administered medications on file prior to visit.    Allergies  Allergen Reactions  . Latex Hives    TONGUE HAD BLISTERS WHEN HAD DENTAL SURGERY  . Amoxicillin Nausea And Vomiting  . Aspirin     Gi upset  . Barbiturates   .  Celebrex [Celecoxib]   . Codeine Itching  . Evista [Raloxifene]   . Lipitor [Atorvastatin]   . Morphine And Related   . Oruvail [Ketoprofen]   . Pantoprazole Diarrhea  . Paraffin   . Prochlorperazine Other (See Comments)    Uncontrolled shaking  . Proloprim [Trimethoprim]   . Venlafaxine Nausea Only  . Vibra-Tab [Doxycycline]   . Vioxx [Rofecoxib]     edema  . Betadine [Povidone Iodine] Rash  . Caffeine Palpitations   Past Medical History:  Diagnosis Date  . Anxiety   . Arthritis   . Blood transfusion   . C. difficile colitis   . Candida esophagitis  ([place]) 2013   EGD  . Cataract   . Chronic kidney disease    STONES  . Diverticulitis 04/05/2017  . Diverticulosis of colon (without mention of hemorrhage) 2007   Colonoscopy  . Dysrhythmia    RX  . Emphysema of lung ([place])   . Family history of colon cancer    sister  . Fibromyalgia   . Fracture, zygoma closed ([place]) 07/13/2011  . GERD (gastroesophageal reflux disease)   . Headache(784.0)   . Hyperlipemia   . Hypertension   . Kidney stones   . Ptosis, bilateral    Health Maintenance  Topic Date Due  . COLONOSCOPY  03/17/2017  . INFLUENZA VACCINE  04/10/2018 (Originally 01/23/2018)  . TETANUS/TDAP  08/08/2018 (Originally 06/25/2013)  . DEXA SCAN  Completed  . PNA vac Low Risk Adult  Completed   Immunization History  Administered Date(s) Administered  . Influenza, High Dose Seasonal PF 03/25/2014, 04/05/2016  . Influenza-Unspecified 03/09/2013, 03/11/2015  . Pneumococcal Conjugate-13 12/21/2015  . Pneumococcal Polysaccharide-23 06/25/2002  . Td 06/26/2003   Last Colon -  Last MGM -  Past Surgical History:  Procedure Laterality Date  . ABDOMINAL HYSTERECTOMY    . BACK SURGERY     X2  . BACK SURGERY    . BREAST EXCISIONAL BIOPSY    . CERVICAL DISC SURGERY    . COLONOSCOPY    . FACIAL FRACTURE SURGERY    . HAND SURGERY     BIL   . ORIF TRIPOD FRACTURE  07/13/2011   Procedure: OPEN REDUCTION INTERNAL FIXATION (ORIF) TRIPOD FRACTURE;  Surgeon: Tyson Alias, MD;  Location: [facility];  Service: ENT;  Laterality: Right;  ORIF RIGHT ZYGOMA, ORBITAL FLOOR EXPLORATION WITH FROST STITCH (TEMPORARY TARSORRHAPHY)  . PTOSIS REPAIR Bilateral 11/22/2016   Procedure: INTERNAL PTOSIS REPAIR;  Surgeon: Clista Bernhardt, MD;  Location: [facility];  Service: Ophthalmology;  Laterality: Bilateral;  . SHOULDER ARTHROSCOPY W/ ROTATOR CUFF REPAIR     LFT   Family History  Problem Relation Age of Onset  . Heart disease Mother   . Heart disease Father   . Breast cancer Sister 24  . Colon cancer  Sister   . Cancer Sister        breast  . Cancer Sister        colon  . Cancer Sister        colon  . Hypertension Daughter   . Hyperlipidemia Daughter    Social History   Tobacco Use  . Smoking status: Never Smoker  . Smokeless tobacco: Never Used  Substance Use Topics  . Alcohol use: No  . Drug use: No    ROS Constitutional: Denies fever, chills, weight loss/gain, headaches, insomnia,  night sweats, and change in appetite. Does c/o fatigue. Eyes: Denies redness, blurred vision, diplopia, discharge, itchy, watery eyes.  ENT: Denies discharge, congestion, post nasal drip, epistaxis, sore throat, earache, hearing loss, dental pain, Tinnitus, Vertigo, Sinus pain, snoring.  Cardio: Denies chest pain, palpitations, irregular heartbeat, syncope, dyspnea, diaphoresis, orthopnea, PND, claudication, edema Respiratory: denies cough, dyspnea, DOE, pleurisy, hoarseness, laryngitis, wheezing.  Gastrointestinal: Denies dysphagia, heartburn, reflux, water brash, pain, cramps, nausea, vomiting, bloating, diarrhea, constipation, hematemesis, melena, hematochezia, jaundice, hemorrhoids Genitourinary: Denies dysuria, frequency, urgency, nocturia, hesitancy, discharge, hematuria, flank pain Breast: Breast lumps, nipple discharge, bleeding.  Musculoskeletal: Denies arthralgia, myalgia, stiffness, Jt. Swelling, pain, limp, and strain/sprain. Denies falls. Skin: Denies puritis, rash, hives, warts, acne, eczema, changing in skin lesion Neuro: No weakness, tremor, incoordination, spasms, paresthesia, pain Psychiatric: Denies confusion, memory loss, sensory loss. Denies Depression. Endocrine: Denies change in weight, skin, hair change, nocturia, and paresthesia, diabetic polys, visual blurring, hyper / hypo glycemic episodes.  Heme/Lymph: No excessive bleeding, bruising, enlarged lymph nodes.  Physical Exam  BP 122/76   Pulse (!) 56   Temp (!) 97.3 F (36.3 C)   Resp 16   Ht 5' 3.5" (1.613 m)   Wt  110 lb (49.9 kg)   BMI 19.18 kg/m   General Appearance: Well groomed and in no apparent distress.  Eyes: PERRLA, EOMs, conjunctiva no swelling or erythema, normal fundi and vessels. Sinuses: No frontal/maxillary tenderness ENT/Mouth: EACs patent / TMs  nl. Nares clear without erythema, swelling, mucoid exudates. Oral hygiene is good. No erythema, swelling, or exudate. Tongue normal, non-obstructing. Tonsils not swollen or erythematous. Hearing normal.  Neck: Supple, thyroid not palpable. No bruits, nodes or JVD. Respiratory: Respiratory effort normal.  BS equal and clear bilateral without rales, rhonci, wheezing or stridor. Cardio: Heart sounds are normal with regular rate and rhythm and no murmurs, rubs or gallops. Peripheral pulses are normal and equal bilaterally without edema. No aortic or femoral bruits. Chest: symmetric with normal excursions and percussion. Breasts: Symmetric, without lumps, nipple discharge, retractions, or fibrocystic changes.  Abdomen: Flat, soft with bowel sounds active. Nontender, no guarding, rebound, hernias, masses, or organomegaly.  Lymphatics: Non tender without lymphadenopathy.  Genitourinary:  Musculoskeletal: Full ROM all peripheral extremities, joint stability, 5/5 strength, and normal gait. Skin: Warm and dry without rashes, lesions, cyanosis, clubbing or  ecchymosis.  Neuro: Cranial nerves intact, reflexes equal bilaterally. Normal muscle tone, no cerebellar symptoms. Sensation intact.  Pysch: Alert and oriented X 3, normal affect, Insight and Judgment appropriate.   Assessment and Plan  1. Annual Preventative Screening Examination  2. Essential hypertension  - EKG 12-Lead - Urinalysis, Routine w reflex microscopic - Microalbumin / creatinine urine ratio - CBC with Differential/Platelet - COMPLETE METABOLIC PANEL WITH GFR - Magnesium - TSH  3. Hyperlipidemia, mixed  - Lipid panel - TSH  4. Abnormal glucose  - Hemoglobin A1c -  Insulin, random  5. Vitamin D deficiency  - VITAMIN D 25 Hydroxyl  6. Prediabetes  - Hemoglobin A1c - Insulin, random  7. Gastroesophageal reflux disease  - CBC with Differential/Platelet  8. Screening for colorectal cancer  - POC Hemoccult Bld/Stl   9. Screening for ischemic heart disease  - EKG 12-Lead  10. FHx: heart disease  - EKG 12-Lead  11. Atherosclerosis of aorta (HCC)  - EKG 12-Lead  12. Medication management  - Urinalysis, Routine w reflex microscopic - Microalbumin / creatinine urine ratio - CBC with Differential/Platelet - COMPLETE METABOLIC PANEL WITH GFR - Magnesium - Lipid panel - TSH - Hemoglobin A1c - Insulin, random - VITAMIN D 25 Hydroxyl   13. Facial pain  syndrome  - gabapentin  600 MG tablet; Take 1/2 to 1 tablet s 3 to 4 x / day as needed for Facial Pain  Dispense: 360 tablet; Refill: 3  14. Hepatic steatosis           Patient was counseled in prudent diet to achieve/maintain BMI less than 25 for weight control, BP monitoring, regular exercise and medications. Discussed med's effects and SE's. Screening labs and tests as requested with regular follow-up as recommended. Over 40 minutes of exam, counseling, chart review and high complex critical decision making was performed.

## 2017-11-14 NOTE — Patient Instructions (Signed)

## 2017-11-15 LAB — LIPID PANEL
CHOL/HDL RATIO: 2.8 (calc) (ref <5.0)
Cholesterol: 193 mg/dL (ref <200)
HDL: 70 mg/dL (ref >50)
LDL Cholesterol (Calc): 97 mg/dL (calc)
NON-HDL CHOLESTEROL (CALC): 123 mg/dL (ref <130)
Triglycerides: 155 mg/dL — ABNORMAL HIGH (ref <150)

## 2017-11-15 LAB — HEMOGLOBIN A1C
EAG (MMOL/L): 6.5 (calc)
Hgb A1c MFr Bld: 5.7 % of total Hgb — ABNORMAL HIGH (ref <5.7)
MEAN PLASMA GLUCOSE: 117 (calc)

## 2017-11-15 LAB — COMPLETE METABOLIC PANEL WITH GFR
AG RATIO: 1.7 (calc) (ref 1.0–2.5)
ALT: 11 U/L (ref 6–29)
AST: 18 U/L (ref 10–35)
Albumin: 4.3 g/dL (ref 3.6–5.1)
Alkaline phosphatase (APISO): 63 U/L (ref 33–130)
BILIRUBIN TOTAL: 0.6 mg/dL (ref 0.2–1.2)
BUN: 12 mg/dL (ref 7–25)
CHLORIDE: 97 mmol/L — AB (ref 98–110)
CO2: 32 mmol/L (ref 20–32)
Calcium: 9.6 mg/dL (ref 8.6–10.4)
Creat: 0.82 mg/dL (ref 0.60–0.88)
GFR, EST AFRICAN AMERICAN: 78 mL/min/{1.73_m2} (ref >=60)
GFR, Est Non African American: 67 mL/min/{1.73_m2} (ref >=60)
GLOBULIN: 2.5 g/dL (ref 1.9–3.7)
Glucose, Bld: 81 mg/dL (ref 65–99)
POTASSIUM: 5.3 mmol/L (ref 3.5–5.3)
SODIUM: 133 mmol/L — AB (ref 135–146)
TOTAL PROTEIN: 6.8 g/dL (ref 6.1–8.1)

## 2017-11-15 LAB — CBC WITH DIFFERENTIAL/PLATELET
BASOS ABS: 39 {cells}/uL (ref 0–200)
Basophils Relative: 0.7 %
EOS ABS: 88 {cells}/uL (ref 15–500)
EOS PCT: 1.6 %
HEMATOCRIT: 36.5 % (ref 35.0–45.0)
Hemoglobin: 12.5 g/dL (ref 11.7–15.5)
LYMPHS ABS: 1232 {cells}/uL (ref 850–3900)
MCH: 30.9 pg (ref 27.0–33.0)
MCHC: 34.2 g/dL (ref 32.0–36.0)
MCV: 90.1 fL (ref 80.0–100.0)
MONOS PCT: 10.8 %
MPV: 10.7 fL (ref 7.5–12.5)
NEUTROS PCT: 64.5 %
Neutro Abs: 3548 cells/uL (ref 1500–7800)
Platelets: 217 10*3/uL (ref 140–400)
RBC: 4.05 10*6/uL (ref 3.80–5.10)
RDW: 12.5 % (ref 11.0–15.0)
Total Lymphocyte: 22.4 %
WBC mixed population: 594 cells/uL (ref 200–950)
WBC: 5.5 10*3/uL (ref 3.8–10.8)

## 2017-11-15 LAB — URINALYSIS, ROUTINE W REFLEX MICROSCOPIC
Bilirubin Urine: NEGATIVE
Glucose, UA: NEGATIVE
HGB URINE DIPSTICK: NEGATIVE
KETONES UR: NEGATIVE
LEUKOCYTES UA: NEGATIVE
Nitrite: NEGATIVE
PROTEIN: NEGATIVE
SPECIFIC GRAVITY, URINE: 1.013 (ref 1.001–1.03)
pH: <=5 (ref 5.0–8.0)

## 2017-11-15 LAB — MICROALBUMIN / CREATININE URINE RATIO
Creatinine, Urine: 59 mg/dL (ref 20–275)
MICROALB UR: 0.6 mg/dL
MICROALB/CREAT RATIO: 10 ug/mg{creat} (ref <30)

## 2017-11-15 LAB — VITAMIN D 25 HYDROXY (VIT D DEFICIENCY, FRACTURES): Vit D, 25-Hydroxy: 75 ng/mL (ref 30–100)

## 2017-11-15 LAB — TSH: TSH: 1.23 mIU/L (ref 0.40–4.50)

## 2017-11-15 LAB — MAGNESIUM: Magnesium: 1.9 mg/dL (ref 1.5–2.5)

## 2017-11-15 LAB — INSULIN, RANDOM: INSULIN: 3.6 u[IU]/mL (ref 2.0–19.6)

## 2017-11-17 ENCOUNTER — Encounter: Payer: Self-pay | Admitting: Internal Medicine

## 2017-11-17 DIAGNOSIS — K760 Fatty (change of) liver, not elsewhere classified: Secondary | ICD-10-CM | POA: Insufficient documentation

## 2017-11-21 DIAGNOSIS — M25461 Effusion, right knee: Secondary | ICD-10-CM | POA: Diagnosis not present

## 2017-11-21 DIAGNOSIS — M67911 Unspecified disorder of synovium and tendon, right shoulder: Secondary | ICD-10-CM | POA: Diagnosis not present

## 2017-11-21 DIAGNOSIS — M25462 Effusion, left knee: Secondary | ICD-10-CM | POA: Diagnosis not present

## 2017-11-21 DIAGNOSIS — M25511 Pain in right shoulder: Secondary | ICD-10-CM | POA: Diagnosis not present

## 2017-12-03 ENCOUNTER — Other Ambulatory Visit: Payer: Self-pay

## 2017-12-03 DIAGNOSIS — Z1212 Encounter for screening for malignant neoplasm of rectum: Secondary | ICD-10-CM | POA: Diagnosis not present

## 2017-12-03 DIAGNOSIS — Z1211 Encounter for screening for malignant neoplasm of colon: Secondary | ICD-10-CM

## 2017-12-03 LAB — POC HEMOCCULT BLD/STL (HOME/3-CARD/SCREEN)
Card #2 Fecal Occult Blod, POC: NEGATIVE
FECAL OCCULT BLD: NEGATIVE
Fecal Occult Blood, POC: NEGATIVE

## 2017-12-19 ENCOUNTER — Other Ambulatory Visit: Payer: Self-pay | Admitting: Internal Medicine

## 2017-12-31 ENCOUNTER — Emergency Department (HOSPITAL_COMMUNITY)
Admission: EM | Admit: 2017-12-31 | Discharge: 2017-12-31 | Disposition: A | Discharge status: Home / Self Care | Payer: PPO | Attending: Emergency Medicine | Admitting: Emergency Medicine

## 2017-12-31 ENCOUNTER — Other Ambulatory Visit: Payer: Self-pay

## 2017-12-31 ENCOUNTER — Encounter (HOSPITAL_COMMUNITY): Payer: Self-pay | Admitting: Emergency Medicine

## 2017-12-31 ENCOUNTER — Emergency Department (HOSPITAL_COMMUNITY): Payer: PPO

## 2017-12-31 DIAGNOSIS — S199XXA Unspecified injury of neck, initial encounter: Secondary | ICD-10-CM | POA: Diagnosis not present

## 2017-12-31 DIAGNOSIS — M542 Cervicalgia: Secondary | ICD-10-CM | POA: Diagnosis not present

## 2017-12-31 DIAGNOSIS — M25511 Pain in right shoulder: Secondary | ICD-10-CM | POA: Insufficient documentation

## 2017-12-31 DIAGNOSIS — N189 Chronic kidney disease, unspecified: Secondary | ICD-10-CM | POA: Insufficient documentation

## 2017-12-31 DIAGNOSIS — J449 Chronic obstructive pulmonary disease, unspecified: Secondary | ICD-10-CM | POA: Insufficient documentation

## 2017-12-31 DIAGNOSIS — I129 Hypertensive chronic kidney disease with stage 1 through stage 4 chronic kidney disease, or unspecified chronic kidney disease: Secondary | ICD-10-CM | POA: Diagnosis not present

## 2017-12-31 DIAGNOSIS — Z91040 Latex allergy status: Secondary | ICD-10-CM | POA: Insufficient documentation

## 2017-12-31 DIAGNOSIS — R001 Bradycardia, unspecified: Secondary | ICD-10-CM | POA: Diagnosis not present

## 2017-12-31 DIAGNOSIS — M25561 Pain in right knee: Secondary | ICD-10-CM | POA: Diagnosis not present

## 2017-12-31 DIAGNOSIS — Z79899 Other long term (current) drug therapy: Secondary | ICD-10-CM | POA: Insufficient documentation
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
## 2017-12-31 NOTE — ED Notes (Signed)
Pt ambulated to RR with slow steady gait and stand by assist (for precautions r/t pt stating her "knee had been giving out" ).
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
## 2017-12-31 NOTE — ED Notes (Signed)
Pt returns from radiology. Family at bedside.

## 2017-12-31 NOTE — ED Notes (Signed)
Patient transported to CT 

## 2018-01-02 DIAGNOSIS — M25561 Pain in right knee: Secondary | ICD-10-CM | POA: Diagnosis not present

## 2018-01-02 DIAGNOSIS — M67911 Unspecified disorder of synovium and tendon, right shoulder: Secondary | ICD-10-CM | POA: Diagnosis not present

## 2018-01-02 DIAGNOSIS — M25562 Pain in left knee: Secondary | ICD-10-CM | POA: Diagnosis not present

## 2018-01-02 DIAGNOSIS — M542 Cervicalgia: Secondary | ICD-10-CM | POA: Diagnosis not present

## 2018-01-16 DIAGNOSIS — M25561 Pain in right knee: Secondary | ICD-10-CM | POA: Diagnosis not present

## 2018-01-16 DIAGNOSIS — M25511 Pain in right shoulder: Secondary | ICD-10-CM | POA: Diagnosis not present

## 2018-02-10 DIAGNOSIS — M25562 Pain in left knee: Secondary | ICD-10-CM | POA: Diagnosis not present

## 2018-02-10 DIAGNOSIS — M25561 Pain in right knee: Secondary | ICD-10-CM | POA: Diagnosis not present

## 2018-02-10 DIAGNOSIS — M67911 Unspecified disorder of synovium and tendon, right shoulder: Secondary | ICD-10-CM | POA: Diagnosis not present

## 2018-02-18 DIAGNOSIS — M25511 Pain in right shoulder: Secondary | ICD-10-CM | POA: Diagnosis not present
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
## 2018-02-26 ENCOUNTER — Other Ambulatory Visit: Payer: Self-pay | Admitting: *Deleted

## 2018-02-26 ENCOUNTER — Encounter: Payer: Self-pay | Admitting: Physician Assistant

## 2018-02-26 ENCOUNTER — Ambulatory Visit (INDEPENDENT_AMBULATORY_CARE_PROVIDER_SITE_OTHER): Payer: PPO | Admitting: Physician Assistant

## 2018-02-26 VITALS — BP 118/76 | HR 55 | Temp 98.3°F | Resp 14 | Ht 63.5 in | Wt 117.2 lb

## 2018-02-26 DIAGNOSIS — I771 Stricture of artery: Secondary | ICD-10-CM | POA: Diagnosis not present

## 2018-02-26 DIAGNOSIS — R7309 Other abnormal glucose: Secondary | ICD-10-CM

## 2018-02-26 DIAGNOSIS — J439 Emphysema, unspecified: Secondary | ICD-10-CM | POA: Diagnosis not present

## 2018-02-26 DIAGNOSIS — I10 Essential (primary) hypertension: Secondary | ICD-10-CM

## 2018-02-26 DIAGNOSIS — Z79899 Other long term (current) drug therapy: Secondary | ICD-10-CM

## 2018-02-26 DIAGNOSIS — E782 Mixed hyperlipidemia: Secondary | ICD-10-CM | POA: Diagnosis not present

## 2018-02-26 DIAGNOSIS — E441 Mild protein-calorie malnutrition: Secondary | ICD-10-CM | POA: Diagnosis not present

## 2018-02-26 DIAGNOSIS — I700 Atherosclerosis of aorta: Secondary | ICD-10-CM | POA: Diagnosis not present

## 2018-02-26 MED ORDER — RANITIDINE HCL 300 MG PO TABS: ORAL_TABLET | ORAL | 1 refills | Status: DC

## 2018-02-26 NOTE — Patient Instructions (Signed)
Varicose Veins Varicose veins are veins that have become enlarged and twisted. CAUSES This condition is the result of valves in the veins not working properly. Valves in the veins help return blood from the leg to the heart. When your calf muscles squeeze, the blood moves up your leg then the valves close and this continues until the blood gets back to your heart.  If these valves are damaged, blood flows backwards and backs up into the veins in the leg near the skin OR if your are sitting/standing for a long time without using your calf muscles the blood will back up into the veins in your legs. This causes the veins to become larger. People who are on their feet a lot, sit a lot without walking (like on a plane, at a desk, or in a car), who are pregnant, or who are overweight are more likely to develop varicose veins. SYMPTOMS   Bulging, twisted-appearing, bluish veins, most commonly found on the legs.  Leg pain or a feeling of heaviness. These symptoms may be worse at the end of the day.  Leg swelling.  Skin color changes. DIAGNOSIS  Varicose veins can usually be diagnosed with an exam of your legs by your caregiver. He or she may recommend an ultrasound of your leg veins. TREATMENT  Most varicose veins can be treated at home.However, other treatments are available for people who have persistent symptoms or who want to treat the cosmetic appearance of the varicose veins. But this is only cosmetic and they will return if not properly treated. These include:  Laser treatment of very small varicose veins.  Medicine that is shot (injected) into the vein. This medicine hardens the walls of the vein and closes off the vein. This treatment is called sclerotherapy. Afterwards, you may need to wear clothing or bandages that apply pressure.  Surgery. HOME CARE INSTRUCTIONS   Do not stand or sit in one position for long periods of time. Do not sit with your legs crossed. Rest with your legs  raised during the day.  Your legs have to be higher than your heart so that gravity will force the valves to open, so please really elevate your legs.   Wear elastic stockings or support hose. Do not wear other tight, encircling garments around the legs, pelvis, or waist.  [employer]  has a wide variety of well priced compression stockings. [address],  [place] [address] [phone]  OR THERE ARE COPPER INFUSED COMPRESSION SOCKS AT [facility] OR [facility]  [employer] also has great cheap/afforable stockings or socks- the socks are easier to get on your feet  - can also get a jacob's donner that helps you put on the sock  Walk as much as possible to increase blood flow.  Raise the foot of your bed at night with 2-inch blocks.  If you get a cut in the skin over the vein and the vein bleeds, lie down with your leg raised and press on it with a clean cloth until the bleeding stops. Then place a bandage (dressing) on the cut. See your caregiver if it continues to bleed or needs stitches. SEEK MEDICAL CARE IF:   The skin around your ankle starts to break down.  You have pain, redness, tenderness, or hard swelling developing in your leg over a vein.  You are uncomfortable due to leg pain. Document Released: 03/21/2005 Document Revised: 09/03/2011 Document Reviewed: 08/07/2010 [employer] Patient Information 2014 [employer].   

## 2018-02-27 LAB — CBC WITH DIFFERENTIAL/PLATELET
BASOS PCT: 0.7 %
Basophils Absolute: 53 cells/uL (ref 0–200)
Eosinophils Absolute: 158 cells/uL (ref 15–500)
Eosinophils Relative: 2.1 %
HCT: 33.8 % — ABNORMAL LOW (ref 35.0–45.0)
HEMOGLOBIN: 11.5 g/dL — AB (ref 11.7–15.5)
LYMPHS ABS: 1523 {cells}/uL (ref 850–3900)
MCH: 31.3 pg (ref 27.0–33.0)
MCHC: 34 g/dL (ref 32.0–36.0)
MCV: 91.8 fL (ref 80.0–100.0)
MPV: 10.5 fL (ref 7.5–12.5)
Monocytes Relative: 10.3 %
NEUTROS ABS: 4995 {cells}/uL (ref 1500–7800)
Neutrophils Relative %: 66.6 %
PLATELETS: 227 10*3/uL (ref 140–400)
RBC: 3.68 10*6/uL — AB (ref 3.80–5.10)
RDW: 13.2 % (ref 11.0–15.0)
Total Lymphocyte: 20.3 %
WBC: 7.5 10*3/uL (ref 3.8–10.8)
WBCMIX: 773 {cells}/uL (ref 200–950)

## 2018-02-27 LAB — COMPLETE METABOLIC PANEL WITHOUT GFR
AG Ratio: 1.6 (calc) (ref 1.0–2.5)
ALT: 10 U/L (ref 6–29)
AST: 17 U/L (ref 10–35)
Albumin: 4.2 g/dL (ref 3.6–5.1)
Alkaline phosphatase (APISO): 60 U/L (ref 33–130)
BUN: 13 mg/dL (ref 7–25)
CO2: 31 mmol/L (ref 20–32)
Calcium: 9.3 mg/dL (ref 8.6–10.4)
Chloride: 95 mmol/L — ABNORMAL LOW (ref 98–110)
Creat: 0.87 mg/dL (ref 0.60–0.88)
GFR, Est African American: 72 mL/min/1.73m2
GFR, Est Non African American: 62 mL/min/1.73m2
Globulin: 2.6 g/dL (ref 1.9–3.7)
Glucose, Bld: 89 mg/dL (ref 65–99)
Potassium: 5.5 mmol/L — ABNORMAL HIGH (ref 3.5–5.3)
Sodium: 131 mmol/L — ABNORMAL LOW (ref 135–146)
Total Bilirubin: 0.5 mg/dL (ref 0.2–1.2)
Total Protein: 6.8 g/dL (ref 6.1–8.1)

## 2018-02-27 LAB — LIPID PANEL
CHOLESTEROL: 196 mg/dL (ref <200)
HDL: 69 mg/dL (ref >50)
LDL CHOLESTEROL (CALC): 99 mg/dL
Non-HDL Cholesterol (Calc): 127 mg/dL (calc) (ref <130)
TRIGLYCERIDES: 184 mg/dL — AB (ref <150)
Total CHOL/HDL Ratio: 2.8 (calc) (ref <5.0)

## 2018-02-27 LAB — MAGNESIUM: Magnesium: 1.8 mg/dL (ref 1.5–2.5)

## 2018-02-27 LAB — HEMOGLOBIN A1C
Hgb A1c MFr Bld: 5.8 %{Hb} — ABNORMAL HIGH
Mean Plasma Glucose: 120 (calc)
eAG (mmol/L): 6.6 (calc)

## 2018-02-27 LAB — TSH: TSH: 0.81 m[IU]/L (ref 0.40–4.50)

## 2018-03-03 DIAGNOSIS — M67911 Unspecified disorder of synovium and tendon, right shoulder: Secondary | ICD-10-CM | POA: Diagnosis not present

## 2018-03-03 DIAGNOSIS — M25572 Pain in left ankle and joints of left foot: Secondary | ICD-10-CM | POA: Diagnosis not present

## 2018-03-07 ENCOUNTER — Other Ambulatory Visit: Payer: Self-pay | Admitting: Internal Medicine

## 2018-03-07 ENCOUNTER — Ambulatory Visit (INDEPENDENT_AMBULATORY_CARE_PROVIDER_SITE_OTHER): Payer: PPO | Admitting: Adult Health

## 2018-03-07 ENCOUNTER — Encounter: Payer: Self-pay | Admitting: Adult Health

## 2018-03-07 VITALS — BP 124/60 | HR 59 | Temp 97.5°F | Ht 63.5 in | Wt 118.0 lb

## 2018-03-07 DIAGNOSIS — R1030 Lower abdominal pain, unspecified: Secondary | ICD-10-CM

## 2018-03-07 NOTE — Patient Instructions (Signed)
Please go to the ER if you have any severe AB pain, unable to hold down food/water, blood in stool or vomit, chest pain, shortness of breath, or any worsening symptoms.    Hyoscyamine sublingual tablet What is this medicine? HYOSCYAMINE (hye oh SYE a meen) is used to treat stomach and bladder problems. This medicine is also used for rhinitis, to reduce some problems caused by Parkinson's disease, and for the treatment of poisoning with drugs that are usually used to treat myasthenia gravis. This medicine may be used for other purposes; ask your health care provider or pharmacist if you have questions. COMMON BRAND NAME(S): A-Spas S/L, HyoMax-SL, Hyosol SL, Levsin SL, OSCIMIN, Symax What should I tell my health care provider before I take this medicine? They need to know if you have any of these conditions: -difficulty passing urine -glaucoma -heart disease, or previous heart attack -myasthenia gravis -prostate trouble -stomach obstruction -ulcerative colitis -an unusual or allergic reaction to hyoscyamine, other medicines, foods, dyes, or preservatives -pregnant or trying to get pregnant -breast-feeding How should I use this medicine? Take this medicine by mouth. Follow the directions on the prescription label. These tablets may be placed under the tongue, and allowed to dissolve, swallowed whole, or chewed. Take your doses at regular intervals. Do not take your medicine more often than directed. Talk to your pediatrician regarding the use of this medicine in children. Special care may be needed. While this medicine may be prescribed for children as young as 12 years for selected conditions, precautions do apply. Overdosage: If you think you have taken too much of this medicine contact a poison control center or emergency room at once. NOTE: This medicine is only for you. Do not share this medicine with others. What if I miss a dose? If you miss a dose, take it as soon as you can. If it  is almost time for your next dose, take only that dose. Do not take double or extra doses. What may interact with this medicine? -amantadine -antacids -benztropine -donepezil -galantamine -medicines for hay fever and other allergies -medicines for mental depression -medicines for mental problems or psychotic disturbances -rivastigmine -tacrine This list may not describe all possible interactions. Give your health care provider a list of all the medicines, herbs, non-prescription drugs, or dietary supplements you use. Also tell them if you smoke, drink alcohol, or use illegal drugs. Some items may interact with your medicine. What should I watch for while using this medicine? You may get dizzy. Do not drive, use machinery, or do anything that needs mental alertness until you know how this medicine affects you. To reduce the risk of dizzy or fainting spells, do not sit or stand up quickly, especially if you are an older patient. Alcohol can make you more dizzy. Avoid alcoholic drinks. Stay out of bright light and wear sunglasses if this medicine makes your eyes more sensitive to light. Your mouth may get dry. Chewing sugarless gum or sucking hard candy, and drinking plenty of water may help. Contact your doctor if the problem does not go away or is severe. This medicine may cause dry eyes and blurred vision. If you wear contact lenses you may feel some discomfort. Lubricating drops may help. See your eye doctor if the problem does not go away or is severe. Avoid extreme heat (e.g., hot tubs, saunas). This medicine can cause you to sweat less than normal. Your body temperature could increase to dangerous levels, which may lead to heat stroke. What  side effects may I notice from receiving this medicine? Side effects that you should report to your doctor or health care professional as soon as possible: -anxiety, nervousness -confusion -dizziness or fainting spells -fast heartbeat -fever -pain or  difficulty passing urine -unusually weak or tired -vomiting Side effects that usually do not require medical attention (report to your doctor or health care professional if they continue or are bothersome): -altered taste -constipation -nausea This list may not describe all possible side effects. Call your doctor for medical advice about side effects. You may report side effects to FDA at 1-800-FDA-1088. Where should I keep my medicine? Keep out of the reach of children. Store at room temperature between 15 and 30 degrees C (59 and 86 degrees F). Throw away any unused medicine after the expiration date. NOTE: This sheet is a summary. It may not cover all possible information. If you have questions about this medicine, talk to your doctor, pharmacist, or health care provider.  2018 Elsevier/Gold Standard (2007-10-27 14:37:10)

## 2018-03-07 NOTE — Progress Notes (Signed)
Assessment and Plan:  Debra White was seen today for abdominal pain.  Diagnoses and all orders for this visit:  Lower abdominal pain Symptoms currently controlled with levsin; will obtain labs to rule out infection Continue current medications, recommended bland diet, push hydration Present to ED if severe pain, fever/chills, blood in stool over the weekend Follow up PRN -     Urinalysis w microscopic + reflex cultur -     CBC with Differential/Platelet -     COMPLETE METABOLIC PANEL WITH GFR  Further disposition pending results of labs. Discussed med's effects and SE's.   Over 15 minutes of exam, counseling, chart review, and critical decision making was performed.   Future Appointments  Date Time Provider [place]  06/02/2018 10:30 AM Unk Pinto, MD [facility] None  12/09/2018  9:00 AM Unk Pinto, MD [facility] None    ------------------------------------------------------------------------------------------------------------------   HPI BP 124/60   Pulse (!) 59   Temp (!) 97.5 F (36.4 C)   Ht 5' 3.5" (1.613 m)   Wt 118 lb (53.5 kg)   SpO2 98%   BMI 20.57 kg/m   84 y.o.female frail, poor health literacy with hx of anxiety, GERD, hepatic steatosis, diverticulosis, renal calculi presents for evaluation of abdominal pain that began this AM; she called to report she was/is concerned about diverticulitis. She presents today reporting generalized abdominal pain that began this AM, indicates upper abdomen as origin, and that pain is "down from here"; unable to describe further, "just pain" - she does report pain is intermittent. She reports not currently having pain after taking hyoscyamine that was refilled this AM.  She denies nausea/vomiting, recent reflux symptoms, fever/chills, weakness, dizziness, headaches.  She reports she was having diarrhea this past week but that this seems to have resolved. Denies changes in urine, but reports ongoing frequency.   Past  Medical History:  Diagnosis Date  . Anxiety   . Arthritis   . Blood transfusion   . C. difficile colitis   . Candida esophagitis ([place]) 2013   EGD  . Cataract   . Chronic kidney disease    STONES  . Diverticulitis 04/05/2017  . Diverticulosis of colon (without mention of hemorrhage) 2007   Colonoscopy  . Dysrhythmia    RX  . Emphysema of lung ([place])   . Family history of colon cancer    sister  . Fibromyalgia   . Fracture, zygoma closed ([place]) 07/13/2011  . GERD (gastroesophageal reflux disease)   . Headache(784.0)   . Hyperlipemia   . Hypertension   . Kidney stones   . Ptosis, bilateral      Allergies  Allergen Reactions  . Latex Hives    TONGUE HAD BLISTERS WHEN HAD DENTAL SURGERY  . Amoxicillin Nausea And Vomiting  . Aspirin     Gi upset  . Barbiturates   . Celebrex [Celecoxib]   . Codeine Itching  . Evista [Raloxifene]   . Lipitor [Atorvastatin]   . Morphine And Related   . Oruvail [Ketoprofen]   . Pantoprazole Diarrhea  . Paraffin   . Prochlorperazine Other (See Comments)    Uncontrolled shaking  . Proloprim [Trimethoprim]   . Venlafaxine Nausea Only  . Vibra-Tab [Doxycycline]   . Vioxx [Rofecoxib]     edema  . Betadine [Povidone Iodine] Rash  . Caffeine Palpitations    Current Outpatient Medications on File Prior to Visit  Medication Sig  . acetaminophen (TYLENOL) 500 MG tablet Take 500-1,000 mg by mouth every 6 (six) hours as  needed for headache.  Marland Kitchen atenolol (TENORMIN) 100 MG tablet Take 1 tablet daily for BP  . cholecalciferol (VITAMIN D) 1000 UNITS tablet Take 4,000 Units by mouth daily.   . clonazePAM (KLONOPIN) 1 MG tablet Take 1/2 to 1 tablet 2 to 3 x  day ONLY if needed for Anxiety Attack &  limit to 5 days /week to avoid addiction  . dicyclomine (BENTYL) 20 MG tablet Take 1 tablet (20 mg total) by mouth 2 (two) times daily.  . famotidine (PEPCID) 20 MG tablet Take 1 tablet (20 mg total) by mouth 2 (two) times daily.  Marland Kitchen gabapentin (NEURONTIN)  600 MG tablet Take 1/2 to 1 tablet s 3 to 4 x / day as needed for Facial Pain  . losartan (COZAAR) 100 MG tablet TAKE 1/2 TABLET BY MOUTH DAILY  . pravastatin (PRAVACHOL) 40 MG tablet TAKE 1 TABLET BY MOUTH DAILY  . ranitidine (ZANTAC) 300 MG tablet TAKE 1 TABLET BY MOUTH TWICE DAILY AS NEEDED FOR ACID INDIGESTION ANDREFLUX  . traMADol (ULTRAM) 50 MG tablet TAKE 1 TABLET BY MOUTH 4 TIMES DAILY AS NEEDED FOR PAIN  . hyoscyamine (LEVSIN SL) 0.125 MG SL tablet Dissolve 1 tablet under tongue every 4 hours as needed for Nausea, Cramping , bloating or Diarrhea (Patient not taking: Reported on 03/07/2018)  . [DISCONTINUED] omeprazole (PRILOSEC OTC) 20 MG tablet Take 20 mg by mouth daily.   No current facility-administered medications on file prior to visit.     ROS: Review of Systems  Constitutional: Negative for chills, diaphoresis, fever and malaise/fatigue.  HENT: Negative for sore throat.   Eyes: Negative for blurred vision.  Respiratory: Negative for cough and shortness of breath.   Cardiovascular: Negative for chest pain, palpitations, orthopnea and leg swelling.  Gastrointestinal: Positive for abdominal pain. Negative for blood in stool, constipation, diarrhea (was having, resolved), heartburn, melena, nausea and vomiting.  Genitourinary: Positive for frequency. Negative for dysuria, flank pain, hematuria and urgency.  Musculoskeletal: Negative for myalgias.  Skin: Negative for rash.  Neurological: Negative for dizziness and headaches.  Endo/Heme/Allergies: Negative for polydipsia.     Physical Exam:  BP 124/60   Pulse (!) 59   Temp (!) 97.5 F (36.4 C)   Ht 5' 3.5" (1.613 m)   Wt 118 lb (53.5 kg)   SpO2 98%   BMI 20.57 kg/m   General Appearance: Frail elder, in no apparent distress. Eyes: PERRLA, conjunctiva no swelling or erythema ENT/Mouth: No erythema, swelling, or exudate on post pharynx.  Tonsils not swollen or erythematous. Hearing normal.  Neck: Supple.  Respiratory:  Respiratory effort normal, BS equal bilaterally without rales, rhonchi, wheezing or stridor.  Cardio: RRR with no MRGs. Brisk peripheral pulses without edema.  Abdomen: Soft, + BS.  Mild generalized lower abdominal tenderness, no guarding, rebound, palpable hernias, masses. Lymphatics: Non tender without lymphadenopathy.  Musculoskeletal:  normal gait.  Skin: Warm, dry without rashes, lesions, ecchymosis.  Psych: Awake and oriented X 3, normal affect, Insight and Judgment fair.     Izora Ribas, NP 11:24 AM [facility]

## 2018-03-08 LAB — CBC WITH DIFFERENTIAL/PLATELET
BASOS ABS: 40 {cells}/uL (ref 0–200)
BASOS PCT: 0.5 %
EOS ABS: 198 {cells}/uL (ref 15–500)
Eosinophils Relative: 2.5 %
HEMATOCRIT: 32.6 % — AB (ref 35.0–45.0)
HEMOGLOBIN: 11.1 g/dL — AB (ref 11.7–15.5)
LYMPHS ABS: 1446 {cells}/uL (ref 850–3900)
MCH: 31.1 pg (ref 27.0–33.0)
MCHC: 34 g/dL (ref 32.0–36.0)
MCV: 91.3 fL (ref 80.0–100.0)
MONOS PCT: 11.1 %
MPV: 10.7 fL (ref 7.5–12.5)
NEUTROS ABS: 5340 {cells}/uL (ref 1500–7800)
Neutrophils Relative %: 67.6 %
Platelets: 227 10*3/uL (ref 140–400)
RBC: 3.57 10*6/uL — ABNORMAL LOW (ref 3.80–5.10)
RDW: 13.3 % (ref 11.0–15.0)
Total Lymphocyte: 18.3 %
WBC mixed population: 877 cells/uL (ref 200–950)
WBC: 7.9 10*3/uL (ref 3.8–10.8)

## 2018-03-08 LAB — URINALYSIS W MICROSCOPIC + REFLEX CULTURE
BILIRUBIN URINE: NEGATIVE
Bacteria, UA: NONE SEEN /HPF
GLUCOSE, UA: NEGATIVE
HGB URINE DIPSTICK: NEGATIVE
Hyaline Cast: NONE SEEN /LPF
Ketones, ur: NEGATIVE
Leukocyte Esterase: NEGATIVE
NITRITES URINE, INITIAL: NEGATIVE
Protein, ur: NEGATIVE
RBC / HPF: NONE SEEN /HPF (ref 0–2)
SQUAMOUS EPITHELIAL / LPF: NONE SEEN /HPF (ref <=5)
Specific Gravity, Urine: 1.014 (ref 1.001–1.03)
WBC, UA: NONE SEEN /HPF (ref 0–5)
pH: 5.5 (ref 5.0–8.0)

## 2018-03-08 LAB — COMPLETE METABOLIC PANEL WITH GFR
AG Ratio: 1.6 (calc) (ref 1.0–2.5)
ALBUMIN MSPROF: 4.1 g/dL (ref 3.6–5.1)
ALT: 11 U/L (ref 6–29)
AST: 18 U/L (ref 10–35)
Alkaline phosphatase (APISO): 58 U/L (ref 33–130)
BUN: 11 mg/dL (ref 7–25)
CO2: 30 mmol/L (ref 20–32)
CREATININE: 0.85 mg/dL (ref 0.60–0.88)
Calcium: 9.1 mg/dL (ref 8.6–10.4)
Chloride: 94 mmol/L — ABNORMAL LOW (ref 98–110)
GFR, EST AFRICAN AMERICAN: 74 mL/min/{1.73_m2} (ref >=60)
GFR, Est Non African American: 64 mL/min/{1.73_m2} (ref >=60)
GLUCOSE: 95 mg/dL (ref 65–99)
Globulin: 2.5 g/dL (calc) (ref 1.9–3.7)
Potassium: 4.3 mmol/L (ref 3.5–5.3)
SODIUM: 130 mmol/L — AB (ref 135–146)
TOTAL PROTEIN: 6.6 g/dL (ref 6.1–8.1)
Total Bilirubin: 0.5 mg/dL (ref 0.2–1.2)

## 2018-03-08 LAB — NO CULTURE INDICATED

## 2018-03-17 ENCOUNTER — Other Ambulatory Visit: Payer: Self-pay | Admitting: Physician Assistant

## 2018-03-19 ENCOUNTER — Other Ambulatory Visit: Payer: Self-pay | Admitting: Internal Medicine

## 2018-03-22 ENCOUNTER — Encounter (HOSPITAL_COMMUNITY): Payer: Self-pay | Admitting: Emergency Medicine

## 2018-03-22 ENCOUNTER — Observation Stay (HOSPITAL_COMMUNITY)
Admission: EM | Admit: 2018-03-22 | Discharge: 2018-03-24 | Disposition: A | Discharge status: Home / Self Care | Payer: PPO | Attending: Internal Medicine | Admitting: Internal Medicine

## 2018-03-22 ENCOUNTER — Other Ambulatory Visit: Payer: Self-pay

## 2018-03-22 ENCOUNTER — Emergency Department (HOSPITAL_COMMUNITY): Payer: PPO

## 2018-03-22 DIAGNOSIS — Z91040 Latex allergy status: Secondary | ICD-10-CM | POA: Insufficient documentation

## 2018-03-22 DIAGNOSIS — D735 Infarction of spleen: Secondary | ICD-10-CM | POA: Diagnosis not present

## 2018-03-22 DIAGNOSIS — Z880 Allergy status to penicillin: Secondary | ICD-10-CM | POA: Diagnosis not present

## 2018-03-22 DIAGNOSIS — R1084 Generalized abdominal pain: Secondary | ICD-10-CM

## 2018-03-22 DIAGNOSIS — R200 Anesthesia of skin: Secondary | ICD-10-CM | POA: Diagnosis not present

## 2018-03-22 DIAGNOSIS — Z885 Allergy status to narcotic agent status: Secondary | ICD-10-CM | POA: Insufficient documentation

## 2018-03-22 DIAGNOSIS — Z66 Do not resuscitate: Secondary | ICD-10-CM | POA: Diagnosis not present

## 2018-03-22 DIAGNOSIS — I63539 Cerebral infarction due to unspecified occlusion or stenosis of unspecified posterior cerebral artery: Secondary | ICD-10-CM | POA: Diagnosis not present

## 2018-03-22 DIAGNOSIS — M1711 Unilateral primary osteoarthritis, right knee: Secondary | ICD-10-CM | POA: Insufficient documentation

## 2018-03-22 DIAGNOSIS — E559 Vitamin D deficiency, unspecified: Secondary | ICD-10-CM | POA: Diagnosis not present

## 2018-03-22 DIAGNOSIS — Z8249 Family history of ischemic heart disease and other diseases of the circulatory system: Secondary | ICD-10-CM | POA: Insufficient documentation

## 2018-03-22 DIAGNOSIS — F419 Anxiety disorder, unspecified: Secondary | ICD-10-CM | POA: Diagnosis not present

## 2018-03-22 DIAGNOSIS — J439 Emphysema, unspecified: Secondary | ICD-10-CM | POA: Insufficient documentation

## 2018-03-22 DIAGNOSIS — Z79899 Other long term (current) drug therapy: Secondary | ICD-10-CM | POA: Insufficient documentation

## 2018-03-22 DIAGNOSIS — M19019 Primary osteoarthritis, unspecified shoulder: Secondary | ICD-10-CM | POA: Diagnosis not present

## 2018-03-22 DIAGNOSIS — Z881 Allergy status to other antibiotic agents status: Secondary | ICD-10-CM | POA: Insufficient documentation

## 2018-03-22 DIAGNOSIS — M797 Fibromyalgia: Secondary | ICD-10-CM | POA: Diagnosis not present

## 2018-03-22 DIAGNOSIS — Z886 Allergy status to analgesic agent status: Secondary | ICD-10-CM | POA: Diagnosis not present

## 2018-03-22 DIAGNOSIS — I2510 Atherosclerotic heart disease of native coronary artery without angina pectoris: Secondary | ICD-10-CM | POA: Diagnosis not present

## 2018-03-22 DIAGNOSIS — E871 Hypo-osmolality and hyponatremia: Secondary | ICD-10-CM | POA: Diagnosis not present

## 2018-03-22 DIAGNOSIS — E875 Hyperkalemia: Secondary | ICD-10-CM | POA: Diagnosis not present

## 2018-03-22 DIAGNOSIS — K760 Fatty (change of) liver, not elsewhere classified: Secondary | ICD-10-CM | POA: Insufficient documentation

## 2018-03-22 DIAGNOSIS — N280 Ischemia and infarction of kidney: Secondary | ICD-10-CM | POA: Diagnosis not present

## 2018-03-22 DIAGNOSIS — Z90710 Acquired absence of both cervix and uterus: Secondary | ICD-10-CM | POA: Insufficient documentation

## 2018-03-22 DIAGNOSIS — K5730 Diverticulosis of large intestine without perforation or abscess without bleeding: Secondary | ICD-10-CM | POA: Diagnosis not present

## 2018-03-22 DIAGNOSIS — Z888 Allergy status to other drugs, medicaments and biological substances status: Secondary | ICD-10-CM | POA: Insufficient documentation

## 2018-03-22 DIAGNOSIS — I081 Rheumatic disorders of both mitral and tricuspid valves: Secondary | ICD-10-CM | POA: Diagnosis not present

## 2018-03-22 DIAGNOSIS — I272 Pulmonary hypertension, unspecified: Secondary | ICD-10-CM | POA: Diagnosis not present

## 2018-03-22 DIAGNOSIS — I700 Atherosclerosis of aorta: Secondary | ICD-10-CM | POA: Insufficient documentation

## 2018-03-22 DIAGNOSIS — E785 Hyperlipidemia, unspecified: Secondary | ICD-10-CM | POA: Diagnosis not present

## 2018-03-22 DIAGNOSIS — K219 Gastro-esophageal reflux disease without esophagitis: Secondary | ICD-10-CM | POA: Insufficient documentation

## 2018-03-22 DIAGNOSIS — I10 Essential (primary) hypertension: Secondary | ICD-10-CM | POA: Insufficient documentation

## 2018-03-22 HISTORY — DX: Infarction of spleen: D73.5

## 2018-03-22 HISTORY — DX: Ischemia and infarction of kidney: N28.0

## 2018-03-22 LAB — CBC
HEMATOCRIT: 36.3 % (ref 36.0–46.0)
Hemoglobin: 11.8 g/dL — ABNORMAL LOW (ref 12.0–15.0)
MCH: 31.5 pg (ref 26.0–34.0)
MCHC: 32.5 g/dL (ref 30.0–36.0)
MCV: 96.8 fL (ref 78.0–100.0)
Platelets: 231 10*3/uL (ref 150–400)
RBC: 3.75 MIL/uL — ABNORMAL LOW (ref 3.87–5.11)
RDW: 13 % (ref 11.5–15.5)
WBC: 8.5 10*3/uL (ref 4.0–10.5)

## 2018-03-22 LAB — URINALYSIS, ROUTINE W REFLEX MICROSCOPIC
Bacteria, UA: NONE SEEN
Bilirubin Urine: NEGATIVE
Glucose, UA: NEGATIVE mg/dL
Hgb urine dipstick: NEGATIVE
KETONES UR: NEGATIVE mg/dL
Nitrite: NEGATIVE
PH: 6 (ref 5.0–8.0)
Protein, ur: NEGATIVE mg/dL
SPECIFIC GRAVITY, URINE: 1.004 — AB (ref 1.005–1.030)

## 2018-03-22 LAB — COMPREHENSIVE METABOLIC PANEL
ALBUMIN: 4.3 g/dL (ref 3.5–5.0)
ALT: 21 U/L (ref 0–44)
AST: 31 U/L (ref 15–41)
Alkaline Phosphatase: 68 U/L (ref 38–126)
Anion gap: 9 (ref 5–15)
BUN: 7 mg/dL — AB (ref 8–23)
CHLORIDE: 94 mmol/L — AB (ref 98–111)
CO2: 28 mmol/L (ref 22–32)
CREATININE: 0.87 mg/dL (ref 0.44–1.00)
Calcium: 9.3 mg/dL (ref 8.9–10.3)
GFR calc Af Amer: >60 mL/min (ref >60)
GFR calc non Af Amer: >60 mL/min (ref >60)
GLUCOSE: 98 mg/dL (ref 70–99)
POTASSIUM: 5.2 mmol/L — AB (ref 3.5–5.1)
Sodium: 131 mmol/L — ABNORMAL LOW (ref 135–145)
Total Bilirubin: 0.6 mg/dL (ref 0.3–1.2)
Total Protein: 7.2 g/dL (ref 6.5–8.1)

## 2018-03-22 LAB — LIPASE, BLOOD: LIPASE: 42 U/L (ref 11–51)

## 2018-03-22 MED ORDER — SODIUM CHLORIDE 0.9% FLUSH
3.0000 mL | Freq: Two times a day (BID) | INTRAVENOUS | Status: DC
  Administered 2018-03-23: 3 mL via INTRAVENOUS

## 2018-03-22 MED ORDER — SODIUM CHLORIDE 0.9 % IV SOLN
INTRAVENOUS | Status: DC
  Administered 2018-03-23: 03:00:00 via INTRAVENOUS

## 2018-03-22 MED ORDER — FAMOTIDINE 20 MG PO TABS
20.0000 mg | ORAL_TABLET | Freq: Two times a day (BID) | ORAL | Status: DC
  Administered 2018-03-22 – 2018-03-24 (×4): 20 mg via ORAL
  Filled 2018-03-22 (×4): qty 1

## 2018-03-22 MED ORDER — HYOSCYAMINE SULFATE 0.125 MG SL SUBL
0.1250 mg | SUBLINGUAL_TABLET | SUBLINGUAL | Status: DC | PRN
  Administered 2018-03-23: 0.125 mg via SUBLINGUAL
  Filled 2018-03-22 (×3): qty 1

## 2018-03-22 MED ORDER — ONDANSETRON HCL 4 MG PO TABS: 4.0000 mg | ORAL_TABLET | Freq: Four times a day (QID) | ORAL | Status: DC | PRN

## 2018-03-22 MED ORDER — GABAPENTIN 300 MG PO CAPS
600.0000 mg | ORAL_CAPSULE | Freq: Two times a day (BID) | ORAL | Status: DC
  Administered 2018-03-23: 600 mg via ORAL
  Filled 2018-03-22 (×2): qty 2

## 2018-03-22 MED ORDER — IOHEXOL 300 MG/ML  SOLN
100.0000 mL | Freq: Once | INTRAMUSCULAR | Status: AC | PRN
  Administered 2018-03-22: 100 mL via INTRAVENOUS

## 2018-03-22 MED ORDER — SENNOSIDES-DOCUSATE SODIUM 8.6-50 MG PO TABS: 1.0000 | ORAL_TABLET | Freq: Every evening | ORAL | Status: DC | PRN

## 2018-03-22 MED ORDER — HYDROCODONE-ACETAMINOPHEN 5-325 MG PO TABS
1.0000 | ORAL_TABLET | ORAL | Status: DC | PRN
  Administered 2018-03-23 (×2): 2 via ORAL
  Administered 2018-03-24: 1 via ORAL
  Filled 2018-03-22: qty 1
  Filled 2018-03-22 (×3): qty 2

## 2018-03-22 MED ORDER — ATENOLOL 50 MG PO TABS
50.0000 mg | ORAL_TABLET | Freq: Two times a day (BID) | ORAL | Status: DC
  Administered 2018-03-22 – 2018-03-24 (×4): 50 mg via ORAL
  Filled 2018-03-22 (×4): qty 1

## 2018-03-22 MED ORDER — ACETAMINOPHEN 650 MG RE SUPP: 650.0000 mg | Freq: Four times a day (QID) | RECTAL | Status: DC | PRN

## 2018-03-22 MED ORDER — ACETAMINOPHEN 325 MG PO TABS
650.0000 mg | ORAL_TABLET | Freq: Four times a day (QID) | ORAL | Status: DC | PRN
  Administered 2018-03-22: 650 mg via ORAL
  Filled 2018-03-22: qty 2

## 2018-03-22 MED ORDER — ONDANSETRON HCL 4 MG/2ML IJ SOLN: 4.0000 mg | Freq: Four times a day (QID) | INTRAMUSCULAR | Status: DC | PRN

## 2018-03-22 MED ORDER — PRAVASTATIN SODIUM 20 MG PO TABS
20.0000 mg | ORAL_TABLET | Freq: Every day | ORAL | Status: DC
  Administered 2018-03-22 – 2018-03-24 (×3): 20 mg via ORAL
  Filled 2018-03-22 (×3): qty 1

## 2018-03-22 MED ORDER — POLYVINYL ALCOHOL 1.4 % OP SOLN
1.0000 [drp] | Freq: Every day | OPHTHALMIC | Status: DC | PRN
  Filled 2018-03-22: qty 15

## 2018-03-22 MED ORDER — ENOXAPARIN SODIUM 40 MG/0.4ML ~~LOC~~ SOLN
40.0000 mg | SUBCUTANEOUS | Status: DC
  Administered 2018-03-22: 40 mg via SUBCUTANEOUS
  Filled 2018-03-22: qty 0.4

## 2018-03-22 MED ORDER — GABAPENTIN 600 MG PO TABS: 300.0000 mg | ORAL_TABLET | Freq: Three times a day (TID) | ORAL | Status: DC | PRN

## 2018-03-22 MED ORDER — DICLOFENAC SODIUM 1 % TD GEL
1.0000 "application " | Freq: Two times a day (BID) | TRANSDERMAL | Status: DC | PRN
  Filled 2018-03-22: qty 100

## 2018-03-22 MED ORDER — CLONAZEPAM 0.5 MG PO TABS
0.5000 mg | ORAL_TABLET | Freq: Two times a day (BID) | ORAL | Status: DC
  Administered 2018-03-22 – 2018-03-24 (×4): 0.5 mg via ORAL
  Filled 2018-03-22 (×4): qty 1

## 2018-03-22 MED ORDER — DICYCLOMINE HCL 10 MG PO CAPS
10.0000 mg | ORAL_CAPSULE | Freq: Every day | ORAL | Status: DC
  Administered 2018-03-23 – 2018-03-24 (×2): 10 mg via ORAL
  Filled 2018-03-22 (×2): qty 1

## 2018-03-22 MED ORDER — LORAZEPAM 2 MG/ML IJ SOLN
0.5000 mg | Freq: Once | INTRAMUSCULAR | Status: AC | PRN
  Administered 2018-03-23: 0.5 mg via INTRAVENOUS
  Filled 2018-03-22: qty 1

## 2018-03-22 MED ORDER — LOSARTAN POTASSIUM 50 MG PO TABS: 50.0000 mg | ORAL_TABLET | Freq: Every day | ORAL | Status: DC

## 2018-03-22 NOTE — ED Provider Notes (Signed)
[facility] EMERGENCY DEPARTMENT Provider Note   CSN: [id number] Arrival date & time: 03/22/18  1154   History   Chief Complaint Chief Complaint  Patient presents with  . Abdominal Pain    HPI Debra White is a 84 y.o. female.  HPI  Patient with hx of diverticulosis, renal calculi and hepatic steotosis presents with non-focalized abdominal pain for ~2wks.  She was seen at her primary care office on 9/13.   She is unable to name a central point of pain, it has been constant/nagging and moderate in severity.  What has increased is her inability to tolerate food due to nausea over the last few days.  2-3x emesis yesterday with no blood.  1x emesis today, the only thing she has been able to eat today is two pieces of toast and some cheese doodle.  She denies rashes and any new fever symptoms (always has chills).  She has had no chest pain and no SOB.  She can't identify any particular trigger that she thinks might have caused this event.  She has been using tylenol and tramadol for pain.  Denies urinary or BM changes.  SurgHx of hysterectomy, still has gallbladder and unsure about appendix.  Past Medical History:  Diagnosis Date  . Anxiety   . Arthritis   . Blood transfusion   . C. difficile colitis   . Candida esophagitis ([place]) 2013   EGD  . Cataract   . Chronic kidney disease    STONES  . Diverticulitis 04/05/2017  . Diverticulosis of colon (without mention of hemorrhage) 2007   Colonoscopy  . Dysrhythmia    RX  . Emphysema of lung ([place])   . Family history of colon cancer    sister  . Fibromyalgia   . Fracture, zygoma closed ([place]) 07/13/2011  . GERD (gastroesophageal reflux disease)   . Headache(784.0)   . Hyperlipemia   . Hypertension   . Kidney stones   . Ptosis, bilateral     Patient Active Problem List   Diagnosis Date Noted  . Splenic infarct 03/22/2018  . Renal infarction ([place]) 03/22/2018  . Chronic hyponatremia 03/22/2018  . Chronic  hyperkalemia 03/22/2018  . Hepatic steatosis 11/17/2017  . Atherosclerosis of aorta ([place]) 08/08/2017  . 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 Osteoporosis 12/21/2015  . Mild malnutrition ([place]) 05/11/2015  . Encounter for Medicare annual wellness exam 04/08/2015  . Medication management 01/27/2014  . 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 Vitamin D deficiency 07/08/2013  . Hypertension   . Hyperlipidemia   . GERD   . Headache, post-traumatic, chronic 09/20/2011    Past Surgical History:  Procedure Laterality Date  . ABDOMINAL HYSTERECTOMY    . BACK SURGERY     X2  . BACK SURGERY    . BREAST EXCISIONAL BIOPSY    . CERVICAL DISC SURGERY    . COLONOSCOPY    . FACIAL FRACTURE SURGERY    . HAND SURGERY     BIL   . ORIF TRIPOD FRACTURE  07/13/2011   Procedure: OPEN REDUCTION INTERNAL FIXATION (ORIF) TRIPOD FRACTURE;  Surgeon: Tyson Alias, MD;  Location: [facility];  Service: ENT;  Laterality: Right;  ORIF RIGHT ZYGOMA, ORBITAL FLOOR EXPLORATION WITH FROST STITCH (TEMPORARY TARSORRHAPHY)  . PTOSIS REPAIR Bilateral 11/22/2016   Procedure: INTERNAL PTOSIS REPAIR;  Surgeon: Clista Bernhardt, MD;  Location: [facility];  Service: Ophthalmology;  Laterality: Bilateral;  .  SHOULDER ARTHROSCOPY W/ ROTATOR CUFF REPAIR     LFT     OB History   None      Home Medications    Prior to Admission medications   Medication Sig Start Date End Date Taking? Authorizing Provider  acetaminophen (TYLENOL) 500 MG tablet Take 500-1,000 mg by mouth every 6 (six) hours as needed for headache.   Yes [provider]  atenolol (TENORMIN) 100 MG tablet Take 1 tablet daily for BP Patient taking differently: Take 50 mg by mouth 2 (two) times daily.  09/27/17  Yes Unk Pinto, MD  Cholecalciferol (VITAMIN D) 2000 units tablet Take 4,000 Units by mouth daily.    Yes [provider]  clonazePAM (KLONOPIN) 1 MG tablet TAKE  1/2 TO 1 TABLET BY MOUTH 2-3 TIMES DAILY ONLY IF NEEDED FOR ANXIETY ATTACK AND LIMIT TO 5 DAYS PER WEEK TO AVOID ADDICTION Patient taking differently: Take 0.5 mg by mouth 2 (two) times daily.  03/19/18  Yes Unk Pinto, MD  diclofenac sodium (VOLTAREN) 1 % GEL Apply 1 application topically 2 (two) times daily as needed (pain).  02/10/18  Yes [provider]  dicyclomine (BENTYL) 10 MG capsule Take 10 mg by mouth daily. 03/10/18  Yes [provider]  gabapentin (NEURONTIN) 600 MG tablet Take 1/2 to 1 tablet s 3 to 4 x / day as needed for Facial Pain Patient taking differently: Take 300-600 mg by mouth See admin instructions. Take 1 tablet (600 mg) by mouth twice daily - morning and bedtime/ take 1/2 tablet (300 mg) mid-day -  for Facial Pain 11/14/17 11/15/18 Yes Unk Pinto, MD  hyoscyamine (LEVSIN SL) 0.125 MG SL tablet Dissolve 1 tablet under tongue every 4 hours as needed for Nausea, Cramping , bloating or Diarrhea Patient taking differently: Place 0.125 mg under the tongue every 4 (four) hours as needed for cramping (nausea, bloating, diarrhea).  03/07/18  Yes Unk Pinto, MD  losartan (COZAAR) 100 MG tablet TAKE 1/2 TABLET BY MOUTH DAILY Patient taking differently: Take 50 mg by mouth every evening.  06/07/17  Yes Unk Pinto, MD  polyvinyl alcohol (ARTIFICIAL TEARS) 1.4 % ophthalmic solution Place 1 drop into both eyes daily as needed for dry eyes.   Yes [provider]  pravastatin (PRAVACHOL) 40 MG tablet TAKE 1 TABLET BY MOUTH DAILY Patient taking differently: Take 20 mg by mouth every evening.  03/17/18  Yes Vicie Mutters, PA-C  ranitidine (ZANTAC) 300 MG tablet TAKE 1 TABLET BY MOUTH TWICE DAILY AS NEEDED FOR ACID INDIGESTION ANDREFLUX Patient taking differently: Take 300 mg by mouth 2 (two) times daily as needed (acid indigestion/reflux).  02/26/18  Yes Unk Pinto, MD  traMADol (ULTRAM) 50 MG tablet TAKE 1 TABLET BY MOUTH 4 TIMES DAILY AS  NEEDED FOR PAIN Patient taking differently: Take 50 mg by mouth 4 (four) times daily as needed (pain).  08/12/17  Yes Vicie Mutters, PA-C  dicyclomine (BENTYL) 20 MG tablet Take 1 tablet (20 mg total) by mouth 2 (two) times daily. Patient not taking: Reported on 03/22/2018 09/08/17   Mesner, Corene Cornea, MD  famotidine (PEPCID) 20 MG tablet Take 1 tablet (20 mg total) by mouth 2 (two) times daily. Patient not taking: Reported on 03/22/2018 09/08/17   Mesner, Corene Cornea, MD  omeprazole (PRILOSEC OTC) 20 MG tablet Take 20 mg by mouth daily.  09/01/11  [provider]    Family History Family History  Problem Relation Age of Onset  . Heart disease Mother   .  Heart disease Father   . Breast cancer Sister 37  . Colon cancer Sister   . Cancer Sister        breast  . Cancer Sister        colon  . Cancer Sister        colon  . Hypertension Daughter   . Hyperlipidemia Daughter     Social History Social History   Tobacco Use  . Smoking status: Never Smoker  . Smokeless tobacco: Never Used  Substance Use Topics  . Alcohol use: No  . Drug use: No     Allergies   Latex; Amoxicillin; Aspirin; Barbiturates; Celebrex [celecoxib]; Codeine; Evista [raloxifene]; Lipitor [atorvastatin]; Morphine and related; Oruvail [ketoprofen]; Pantoprazole; Paraffin; Prochlorperazine; Proloprim [trimethoprim]; Venlafaxine; Vibra-tab [doxycycline]; Vioxx [rofecoxib]; Betadine [povidone iodine]; and Caffeine   Review of Systems Review of Systems  Constitutional: Positive for chills. Negative for appetite change, diaphoresis and fever.  HENT: Negative for congestion, ear pain, sore throat and trouble swallowing.   Eyes: Negative for pain.  Respiratory: Negative for apnea, cough and shortness of breath.   Cardiovascular: Negative for chest pain and palpitations.  Gastrointestinal: Positive for abdominal pain, nausea and vomiting. Negative for abdominal distention, blood in stool, constipation and diarrhea.    Endocrine: Positive for polyuria.       Chronic polyuria  Genitourinary: Positive for dysuria. Negative for difficulty urinating, hematuria, pelvic pain, urgency and vaginal bleeding.       Dysuria Every now and then but this is not new  Musculoskeletal: Negative for myalgias.  Skin: Negative for pallor and wound.  Neurological: Negative for syncope, speech difficulty and weakness.  Psychiatric/Behavioral: Negative.      Physical Exam Updated Vital Signs BP (!) 172/70   Pulse (!) 52   Temp 98.1 F (36.7 C) (Oral)   Resp 14   SpO2 99%   Physical Exam  Constitutional: She appears well-developed.  Non-toxic appearance. She does not appear ill. No distress.  HENT:  Head: Normocephalic and atraumatic.  Eyes: No scleral icterus.  Cardiovascular: Normal rate, regular rhythm and intact distal pulses.  Murmur heard. 1+ pitting edema  Pulmonary/Chest: Effort normal and breath sounds normal. No stridor. No respiratory distress. She has no wheezes.  Abdominal: Soft. She exhibits no distension, no pulsatile liver and no ascites. There is tenderness in the right upper quadrant, epigastric area and left lower quadrant. There is no rigidity, no rebound, no guarding, no tenderness at McBurney's point and negative Murphy's sign. No hernia.  Neurological: She is alert.  Skin: Skin is warm and dry. Capillary refill takes less than 2 seconds. No rash noted. No erythema.  Psychiatric: She has a normal mood and affect. Her behavior is normal.  Nursing note and vitals reviewed.    ED Treatments / Results  Labs (all labs ordered are listed, but only abnormal results are displayed) Labs Reviewed  COMPREHENSIVE METABOLIC PANEL - Abnormal; Notable for the following components:      Result Value   Sodium 131 (*)    Potassium 5.2 (*)    Chloride 94 (*)    BUN 7 (*)    All other components within normal limits  CBC - Abnormal; Notable for the following components:   RBC 3.75 (*)    Hemoglobin  11.8 (*)    All other components within normal limits  URINALYSIS, ROUTINE W REFLEX MICROSCOPIC - Abnormal; Notable for the following components:   Color, Urine STRAW (*)    Specific Gravity, Urine 1.004 (*)  Leukocytes, UA SMALL (*)    All other components within normal limits  LIPASE, BLOOD    EKG None  Radiology Ct Abdomen Pelvis W Contrast  Result Date: 03/22/2018 CLINICAL DATA:  Severe lower abdominal and right flank pain. History of diverticulitis. EXAM: CT ABDOMEN AND PELVIS WITH CONTRAST TECHNIQUE: Multidetector CT imaging of the abdomen and pelvis was performed using the standard protocol following bolus administration of intravenous contrast. CONTRAST:  181mL OMNIPAQUE IOHEXOL 300 MG/ML  SOLN COMPARISON:  09/08/2017 CT abdomen/pelvis. FINDINGS: Lower chest: No significant pulmonary nodules or acute consolidative airspace disease. Coronary atherosclerosis. Hepatobiliary: Normal liver size. No liver mass. Normal gallbladder with no radiopaque cholelithiasis. No biliary ductal dilatation. Pancreas: Normal, with no mass or duct dilation. Spleen: There is a wedge-shaped focus of low attenuation in the inferior spleen (series 3/image 20), new, compatible with acute splenic infarct. There is a curvilinear focus of low attenuation in the superior spleen, new, compatible with subacute/chronic infarct. Adrenals/Urinary Tract: Normal adrenals. New small poorly defined low-attenuation focus in the posterior lower right kidney (series 8/image 21), suggestive of renal cortical infarct. Subcentimeter hypodense renal cortical lesions in the left kidney are too small to characterize and are stable, considered benign. No hydronephrosis. Normal bladder. Stomach/Bowel: Normal non-distended stomach. Normal caliber small bowel with no small bowel wall thickening. Appendix not discretely visualized. No pericecal inflammatory changes. Marked sigmoid diverticulosis, with no large bowel wall thickening or  significant pericolonic fat stranding. Vascular/Lymphatic: Atherosclerotic nonaneurysmal abdominal aorta. Patent portal, splenic, hepatic and renal veins. No pathologically enlarged lymph nodes in the abdomen or pelvis. Reproductive: Status post hysterectomy, with no abnormal findings at the vaginal cuff. No adnexal mass. Other: No pneumoperitoneum, ascites or focal fluid collection. Musculoskeletal: No aggressive appearing focal osseous lesions. Ankylosis at L4-5 with cerclage wire overlying the posterior lower lumbar spine elements. Moderate lumbar spondylosis. IMPRESSION: 1. Wedge-shaped focus of low attenuation in the inferior spleen, new, compatible with acute splenic infarct. Curvilinear focus of low attenuation in the superior spleen, new, compatible with subacute/chronic infarct. 2. Nonspecific new small poorly defined low-attenuation focus in the posterior lower right kidney, suggestive of acute renal cortical infarct. 3. Consider echocardiographic correlation to exclude a cardiac source of emboli. 4. Marked sigmoid diverticulosis. No evidence of acute diverticulitis. 5.  Aortic Atherosclerosis (ICD10-I70.0). 6. Coronary atherosclerosis. Electronically Signed   By: Ilona Sorrel M.D.   On: 03/22/2018 18:51    Procedures Procedures (including critical care time)  Medications Ordered in ED Medications  iohexol (OMNIPAQUE) 300 MG/ML solution 100 mL (100 mLs Intravenous Contrast Given 03/22/18 1800)     Initial Impression / Assessment and Plan / ED Course  I have reviewed the triage vital signs and the nursing notes.  Pertinent labs & imaging results that were available during my care of the patient were reviewed by me and considered in my medical decision making (see chart for details).   Mild exam and stable presentation in a patient with multiple abdominal comorbidities and a non-specific story.  Labs unremarkaeable, will order CT abd/pelvis.  CT indicates splenic and renal acute infarcts,  will consult for admission to investigate source of emboli and determine appropriate treatment    Final Clinical Impressions(s) / ED Diagnoses   Final diagnoses:  Generalized abdominal pain  Splenic infarct  Renal infarct [facility])    ED Discharge Orders    None       Sherene Sires, DO 03/22/18 1938    Pattricia Boss, MD 03/24/18 [phone]

## 2018-03-22 NOTE — H&P (Signed)
History and Physical    Debra White YQM:[id number] DOB: 12-21-1933 DOA: 03/22/2018  PCP: Unk Pinto, MD   Patient coming from: Home   Chief Complaint: Abdominal and right flank pains   HPI: Debra White is a 84 y.o. female with medical history significant for anxiety, osteoarthritis, right rotator cuff tendinopathy, and hypertension, now presenting to the emergency department for evaluation of abdominal and right flank pain.  Patient reports pain in the mid and lower abdomen, as well as right flank, waxing and waning for the past week.  Pain is severe at times, associated with some occasional nausea and one episode of nonbloody vomiting.  Denies any fevers, chills, or diarrhea.  Denies chest pain or palpitations.  She reports chronic right shoulder and right knee pain with right arm and leg numbness.  She reports that the numbness may have been more severe over the past couple days.  Denies headaches, change in vision or hearing, or focal weakness.  ED Course: Upon arrival to the ED, patient is found to be afebrile, saturating well on room air, bradycardic in the mid 50s, and with stable blood pressure.  Chemistry panel is notable for a sodium of 131 and potassium 5.2.  CBC is unremarkable.  CT the abdomen and pelvis was performed and notable for subacute or chronic appearing splenic infarct, acute appearing splenic infarct, and an acute appearing right renal infarct.  Patient remains hemodynamically stable, in no apparent respiratory distress, and she will be observed for ongoing evaluation and management of splenic and renal infarctions.  Review of Systems:  All other systems reviewed and apart from HPI, are negative.  Past Medical History:  Diagnosis Date  . Anxiety   . Arthritis   . Blood transfusion   . C. difficile colitis   . Candida esophagitis ([place]) 2013   EGD  . Cataract   . Chronic kidney disease    STONES  . Diverticulitis 04/05/2017  . Diverticulosis of colon  (without mention of hemorrhage) 2007   Colonoscopy  . Dysrhythmia    RX  . Emphysema of lung ([place])   . Family history of colon cancer    sister  . Fibromyalgia   . Fracture, zygoma closed ([place]) 07/13/2011  . GERD (gastroesophageal reflux disease)   . Headache(784.0)   . Hyperlipemia   . Hypertension   . Kidney stones   . Ptosis, bilateral     Past Surgical History:  Procedure Laterality Date  . ABDOMINAL HYSTERECTOMY    . BACK SURGERY     X2  . BACK SURGERY    . BREAST EXCISIONAL BIOPSY    . CERVICAL DISC SURGERY    . COLONOSCOPY    . FACIAL FRACTURE SURGERY    . HAND SURGERY     BIL   . ORIF TRIPOD FRACTURE  07/13/2011   Procedure: OPEN REDUCTION INTERNAL FIXATION (ORIF) TRIPOD FRACTURE;  Surgeon: Tyson Alias, MD;  Location: [facility];  Service: ENT;  Laterality: Right;  ORIF RIGHT ZYGOMA, ORBITAL FLOOR EXPLORATION WITH FROST STITCH (TEMPORARY TARSORRHAPHY)  . PTOSIS REPAIR Bilateral 11/22/2016   Procedure: INTERNAL PTOSIS REPAIR;  Surgeon: Clista Bernhardt, MD;  Location: [facility];  Service: Ophthalmology;  Laterality: Bilateral;  . SHOULDER ARTHROSCOPY W/ ROTATOR CUFF REPAIR     LFT     reports that she has never smoked. She has never used smokeless tobacco. She reports that she does not drink alcohol or use drugs.  Allergies  Allergen Reactions  . Latex  Hives    TONGUE HAD BLISTERS WHEN HAD DENTAL SURGERY  . Amoxicillin Nausea And Vomiting  . Aspirin Other (See Comments)    Gi upset  . Barbiturates Other (See Comments)    Unknown reaction  . Celebrex [Celecoxib] Other (See Comments)    Unknown reaction  . Codeine Itching  . Evista [Raloxifene] Other (See Comments)    Unknown reaction  . Lipitor [Atorvastatin] Other (See Comments)    Unknown reaction  . Morphine And Related Other (See Comments)    Unknown reaction  . Oruvail [Ketoprofen] Other (See Comments)    Unknown reaction  . Pantoprazole Diarrhea  . Paraffin Other (See Comments)    Unknown reaction    . Prochlorperazine Other (See Comments)    Uncontrolled shaking  . Proloprim [Trimethoprim] Other (See Comments)    Unknown reaction  . Venlafaxine Nausea Only  . Vibra-Tab [Doxycycline] Other (See Comments)    Unknown reaction  . Vioxx [Rofecoxib] Other (See Comments)    edema  . Betadine [Povidone Iodine] Rash  . Caffeine Palpitations    Family History  Problem Relation Age of Onset  . Heart disease Mother   . Heart disease Father   . Breast cancer Sister 11  . Colon cancer Sister   . Cancer Sister        breast  . Cancer Sister        colon  . Cancer Sister        colon  . Hypertension Daughter   . Hyperlipidemia Daughter      Prior to Admission medications   Medication Sig Start Date End Date Taking? Authorizing Provider  acetaminophen (TYLENOL) 500 MG tablet Take 500-1,000 mg by mouth every 6 (six) hours as needed for headache.   Yes [provider]  atenolol (TENORMIN) 100 MG tablet Take 1 tablet daily for BP Patient taking differently: Take 50 mg by mouth 2 (two) times daily.  09/27/17  Yes Unk Pinto, MD  Cholecalciferol (VITAMIN D) 2000 units tablet Take 4,000 Units by mouth daily.    Yes [provider]  clonazePAM (KLONOPIN) 1 MG tablet TAKE 1/2 TO 1 TABLET BY MOUTH 2-3 TIMES DAILY ONLY IF NEEDED FOR ANXIETY ATTACK AND LIMIT TO 5 DAYS PER WEEK TO AVOID ADDICTION Patient taking differently: Take 0.5 mg by mouth 2 (two) times daily.  03/19/18  Yes Unk Pinto, MD  diclofenac sodium (VOLTAREN) 1 % GEL Apply 1 application topically 2 (two) times daily as needed (pain).  02/10/18  Yes [provider]  dicyclomine (BENTYL) 10 MG capsule Take 10 mg by mouth daily. 03/10/18  Yes [provider]  gabapentin (NEURONTIN) 600 MG tablet Take 1/2 to 1 tablet s 3 to 4 x / day as needed for Facial Pain Patient taking differently: Take 300-600 mg by mouth See admin instructions. Take 1 tablet (600 mg) by mouth twice daily - morning and  bedtime/ take 1/2 tablet (300 mg) mid-day -  for Facial Pain 11/14/17 11/15/18 Yes Unk Pinto, MD  hyoscyamine (LEVSIN SL) 0.125 MG SL tablet Dissolve 1 tablet under tongue every 4 hours as needed for Nausea, Cramping , bloating or Diarrhea Patient taking differently: Place 0.125 mg under the tongue every 4 (four) hours as needed for cramping (nausea, bloating, diarrhea).  03/07/18  Yes Unk Pinto, MD  losartan (COZAAR) 100 MG tablet TAKE 1/2 TABLET BY MOUTH DAILY Patient taking differently: Take 50 mg by mouth every evening.  06/07/17  Yes Unk Pinto, MD  polyvinyl  alcohol (ARTIFICIAL TEARS) 1.4 % ophthalmic solution Place 1 drop into both eyes daily as needed for dry eyes.   Yes [provider]  pravastatin (PRAVACHOL) 40 MG tablet TAKE 1 TABLET BY MOUTH DAILY Patient taking differently: Take 20 mg by mouth every evening.  03/17/18  Yes Vicie Mutters, PA-C  ranitidine (ZANTAC) 300 MG tablet TAKE 1 TABLET BY MOUTH TWICE DAILY AS NEEDED FOR ACID INDIGESTION ANDREFLUX Patient taking differently: Take 300 mg by mouth 2 (two) times daily as needed (acid indigestion/reflux).  02/26/18  Yes Unk Pinto, MD  traMADol (ULTRAM) 50 MG tablet TAKE 1 TABLET BY MOUTH 4 TIMES DAILY AS NEEDED FOR PAIN Patient taking differently: Take 50 mg by mouth 4 (four) times daily as needed (pain).  08/12/17  Yes Vicie Mutters, PA-C  omeprazole (PRILOSEC OTC) 20 MG tablet Take 20 mg by mouth daily.  09/01/11  [provider]    Physical Exam: Vitals:   03/22/18 1706 03/22/18 1745 03/22/18 1930 03/22/18 2049  BP: (!) 174/84 (!) 172/70 (!) 157/67 (!) 192/87  Pulse: 66 (!) 52 (!) 57 64  Resp: 14   18  Temp: 98.1 F (36.7 C)     TempSrc: Oral     SpO2: 99% 99% 97% 100%     Constitutional: NAD, calm  Eyes: PERTLA, lids and conjunctivae normal ENMT: Mucous membranes are moist. Posterior pharynx clear of any exudate or lesions.   Neck: normal, supple, no masses, no  thyromegaly Respiratory: clear to auscultation bilaterally, no wheezing, no crackles. Normal respiratory effort.    Cardiovascular: S1 & S2 heard, regular rate and rhythm. No extremity edema.   Abdomen: No distension, soft, mild tenderness in upper quadrants and right flank, no rebound pain or tenderness. Bowel sounds active.  Musculoskeletal: no clubbing / cyanosis. No joint deformity upper and lower extremities.    Skin: no significant rashes, lesions, ulcers. Warm, dry, well-perfused. Neurologic: No facial asymmetry. Sensation to light touch diminished in RUE and, to lesser extent, RLE. Strength 5/5 in all 4 limbs.  Psychiatric: Alert and oriented x 3. Calm, cooperative   Labs on Admission: I have personally reviewed following labs and imaging studies  CBC: Recent Labs  Lab 03/22/18 1309  WBC 8.5  HGB 11.8*  HCT 36.3  MCV 96.8  PLT 784   Basic Metabolic Panel: Recent Labs  Lab 03/22/18 1309  NA 131*  K 5.2*  CL 94*  CO2 28  GLUCOSE 98  BUN 7*  CREATININE 0.87  CALCIUM 9.3   GFR: CrCl cannot be calculated (Unknown ideal weight.). Liver Function Tests: Recent Labs  Lab 03/22/18 1309  AST 31  ALT 21  ALKPHOS 68  BILITOT 0.6  PROT 7.2  ALBUMIN 4.3   Recent Labs  Lab 03/22/18 1309  LIPASE 42   No results for input(s): AMMONIA in the last 168 hours. Coagulation Profile: No results for input(s): INR, PROTIME in the last 168 hours. Cardiac Enzymes: No results for input(s): CKTOTAL, CKMB, CKMBINDEX, TROPONINI in the last 168 hours. BNP (last 3 results) No results for input(s): PROBNP in the last 8760 hours. HbA1C: No results for input(s): HGBA1C in the last 72 hours. CBG: No results for input(s): GLUCAP in the last 168 hours. Lipid Profile: No results for input(s): CHOL, HDL, LDLCALC, TRIG, CHOLHDL, LDLDIRECT in the last 72 hours. Thyroid Function Tests: No results for input(s): TSH, T4TOTAL, FREET4, T3FREE, THYROIDAB in the last 72 hours. Anemia  Panel: No results for input(s): VITAMINB12, FOLATE, FERRITIN, TIBC, IRON,  RETICCTPCT in the last 72 hours. Urine analysis:    Component Value Date/Time   COLORURINE STRAW (A) 03/22/2018 1338   APPEARANCEUR CLEAR 03/22/2018 1338   LABSPEC 1.004 (L) 03/22/2018 1338   PHURINE 6.0 03/22/2018 1338   GLUCOSEU NEGATIVE 03/22/2018 1338   HGBUR NEGATIVE 03/22/2018 1338   BILIRUBINUR NEGATIVE 03/22/2018 1338   KETONESUR NEGATIVE 03/22/2018 1338   PROTEINUR NEGATIVE 03/22/2018 1338   UROBILINOGEN 0.2 04/13/2015 1700   NITRITE NEGATIVE 03/22/2018 1338   LEUKOCYTESUR SMALL (A) 03/22/2018 1338   Sepsis Labs: @LABRCNTIP (procalcitonin:4,lacticidven:4) )No results found for this or any previous visit (from the past 240 hour(s)).   Radiological Exams on Admission: Ct Abdomen Pelvis W Contrast  Result Date: 03/22/2018 CLINICAL DATA:  Severe lower abdominal and right flank pain. History of diverticulitis. EXAM: CT ABDOMEN AND PELVIS WITH CONTRAST TECHNIQUE: Multidetector CT imaging of the abdomen and pelvis was performed using the standard protocol following bolus administration of intravenous contrast. CONTRAST:  114mL OMNIPAQUE IOHEXOL 300 MG/ML  SOLN COMPARISON:  09/08/2017 CT abdomen/pelvis. FINDINGS: Lower chest: No significant pulmonary nodules or acute consolidative airspace disease. Coronary atherosclerosis. Hepatobiliary: Normal liver size. No liver mass. Normal gallbladder with no radiopaque cholelithiasis. No biliary ductal dilatation. Pancreas: Normal, with no mass or duct dilation. Spleen: There is a wedge-shaped focus of low attenuation in the inferior spleen (series 3/image 20), new, compatible with acute splenic infarct. There is a curvilinear focus of low attenuation in the superior spleen, new, compatible with subacute/chronic infarct. Adrenals/Urinary Tract: Normal adrenals. New small poorly defined low-attenuation focus in the posterior lower right kidney (series 8/image 21), suggestive of  renal cortical infarct. Subcentimeter hypodense renal cortical lesions in the left kidney are too small to characterize and are stable, considered benign. No hydronephrosis. Normal bladder. Stomach/Bowel: Normal non-distended stomach. Normal caliber small bowel with no small bowel wall thickening. Appendix not discretely visualized. No pericecal inflammatory changes. Marked sigmoid diverticulosis, with no large bowel wall thickening or significant pericolonic fat stranding. Vascular/Lymphatic: Atherosclerotic nonaneurysmal abdominal aorta. Patent portal, splenic, hepatic and renal veins. No pathologically enlarged lymph nodes in the abdomen or pelvis. Reproductive: Status post hysterectomy, with no abnormal findings at the vaginal cuff. No adnexal mass. Other: No pneumoperitoneum, ascites or focal fluid collection. Musculoskeletal: No aggressive appearing focal osseous lesions. Ankylosis at L4-5 with cerclage wire overlying the posterior lower lumbar spine elements. Moderate lumbar spondylosis. IMPRESSION: 1. Wedge-shaped focus of low attenuation in the inferior spleen, new, compatible with acute splenic infarct. Curvilinear focus of low attenuation in the superior spleen, new, compatible with subacute/chronic infarct. 2. Nonspecific new small poorly defined low-attenuation focus in the posterior lower right kidney, suggestive of acute renal cortical infarct. 3. Consider echocardiographic correlation to exclude a cardiac source of emboli. 4. Marked sigmoid diverticulosis. No evidence of acute diverticulitis. 5.  Aortic Atherosclerosis (ICD10-I70.0). 6. Coronary atherosclerosis. Electronically Signed   By: Ilona Sorrel M.D.   On: 03/22/2018 18:51    EKG: Ordered, not yet performed.   Assessment/Plan   1. Acute splenic and renal infarcts  - Presents with abdominal and right flank pain, no infectious s/s, and is found to have subacute or chronic and acute splenic infarctions, as well as acute right renal  infarct  - No history of a fib but reports being started on atenolol years ago for rapid HR  - Plan to obtain EKG, continue cardiac monitoring, check echocardiogram    2. Right-sided numbness  - Patient is a difficult historian, reports numbness in RUE and RLE,  describes some chronic numbness that she attributes to chronic OA and tendinopathy involving right shoulder and right knee, but has been more severe in recent days  - With splenic and renal infarctions and concern for cardioembolic phenomenon, will rule-out CVA with MRI brain    3. Hypertension  - BP at goal  - Continue atenolol, hold losartan in light of hyperkalemia    4. Anxiety  - Continue Klonopin    5. Hyponatremia  - Serum sodium is 131 on admission   - Patient appears hypovolemic and will be hydrated with normal saline overnight  - Repeat chem panel in am   6. Hyperkalemia  - Potassium slightly elevated, appears this may be chronic  - Check EKG, continue cardiac monitoring, hold losartan, start IVF hydration, and repeat chem panel in am     DVT prophylaxis: Lovenox   Code Status: DNR  Family Communication: Brother updated at bedside Consults called: None Admission status: Observation     Vianne Bulls, MD [facility] Pager [phone]  If 7PM-7AM, please contact night-coverage [email or web address] Password [facility]  03/22/2018, 9:38 PM

## 2018-03-22 NOTE — ED Triage Notes (Addendum)
Pt reports severe lower abd pain and right flank pain x1 week, reports hx of diverticulitis. Reports some vomiting yesterday. Denies any bloody or dark tarry stools, states abd pain is worse after she urinates. resp e/u, nad

## 2018-03-23 ENCOUNTER — Encounter (HOSPITAL_COMMUNITY): Payer: Self-pay | Admitting: Radiology

## 2018-03-23 ENCOUNTER — Observation Stay (HOSPITAL_BASED_OUTPATIENT_CLINIC_OR_DEPARTMENT_OTHER): Payer: PPO

## 2018-03-23 ENCOUNTER — Observation Stay (HOSPITAL_COMMUNITY): Payer: PPO

## 2018-03-23 DIAGNOSIS — E871 Hypo-osmolality and hyponatremia: Secondary | ICD-10-CM | POA: Diagnosis not present

## 2018-03-23 DIAGNOSIS — M19019 Primary osteoarthritis, unspecified shoulder: Secondary | ICD-10-CM | POA: Diagnosis not present

## 2018-03-23 DIAGNOSIS — Z66 Do not resuscitate: Secondary | ICD-10-CM | POA: Diagnosis not present

## 2018-03-23 DIAGNOSIS — E785 Hyperlipidemia, unspecified: Secondary | ICD-10-CM | POA: Diagnosis not present

## 2018-03-23 DIAGNOSIS — R1084 Generalized abdominal pain: Secondary | ICD-10-CM | POA: Diagnosis not present

## 2018-03-23 DIAGNOSIS — I340 Nonrheumatic mitral (valve) insufficiency: Secondary | ICD-10-CM

## 2018-03-23 DIAGNOSIS — M1711 Unilateral primary osteoarthritis, right knee: Secondary | ICD-10-CM | POA: Diagnosis not present

## 2018-03-23 DIAGNOSIS — N280 Ischemia and infarction of kidney: Secondary | ICD-10-CM | POA: Diagnosis not present

## 2018-03-23 DIAGNOSIS — E875 Hyperkalemia: Secondary | ICD-10-CM | POA: Diagnosis not present

## 2018-03-23 DIAGNOSIS — F419 Anxiety disorder, unspecified: Secondary | ICD-10-CM | POA: Diagnosis not present

## 2018-03-23 DIAGNOSIS — J439 Emphysema, unspecified: Secondary | ICD-10-CM | POA: Diagnosis not present

## 2018-03-23 DIAGNOSIS — D735 Infarction of spleen: Secondary | ICD-10-CM | POA: Diagnosis not present

## 2018-03-23 DIAGNOSIS — R200 Anesthesia of skin: Secondary | ICD-10-CM | POA: Diagnosis not present

## 2018-03-23 DIAGNOSIS — I63539 Cerebral infarction due to unspecified occlusion or stenosis of unspecified posterior cerebral artery: Secondary | ICD-10-CM | POA: Diagnosis not present

## 2018-03-23 DIAGNOSIS — I10 Essential (primary) hypertension: Secondary | ICD-10-CM | POA: Diagnosis not present

## 2018-03-23 LAB — BASIC METABOLIC PANEL
Anion gap: 10 (ref 5–15)
BUN: 6 mg/dL — ABNORMAL LOW (ref 8–23)
CHLORIDE: 96 mmol/L — AB (ref 98–111)
CO2: 26 mmol/L (ref 22–32)
Calcium: 8.9 mg/dL (ref 8.9–10.3)
Creatinine, Ser: 0.84 mg/dL (ref 0.44–1.00)
GFR calc Af Amer: >60 mL/min (ref >60)
GLUCOSE: 85 mg/dL (ref 70–99)
Potassium: 3.9 mmol/L (ref 3.5–5.1)
SODIUM: 132 mmol/L — AB (ref 135–145)

## 2018-03-23 LAB — ECHOCARDIOGRAM COMPLETE

## 2018-03-23 MED ORDER — SODIUM CHLORIDE 0.9 % IV SOLN
INTRAVENOUS | Status: DC
  Administered 2018-03-23 (×2): via INTRAVENOUS

## 2018-03-23 MED ORDER — HEPARIN BOLUS VIA INFUSION
1500.0000 [IU] | Freq: Once | INTRAVENOUS | Status: AC
  Administered 2018-03-23: 1500 [IU] via INTRAVENOUS
  Filled 2018-03-23: qty 1500

## 2018-03-23 MED ORDER — GABAPENTIN 300 MG PO CAPS
600.0000 mg | ORAL_CAPSULE | Freq: Two times a day (BID) | ORAL | Status: DC
  Administered 2018-03-23: 600 mg via ORAL
  Filled 2018-03-23: qty 2

## 2018-03-23 MED ORDER — HEPARIN (PORCINE) IN NACL 100-0.45 UNIT/ML-% IJ SOLN
800.0000 [IU]/h | INTRAMUSCULAR | Status: DC
  Administered 2018-03-23 – 2018-03-24 (×2): 800 [IU]/h via INTRAVENOUS
  Filled 2018-03-23 (×2): qty 250

## 2018-03-23 MED ORDER — LOSARTAN POTASSIUM 50 MG PO TABS
50.0000 mg | ORAL_TABLET | Freq: Every day | ORAL | Status: DC
  Administered 2018-03-23 – 2018-03-24 (×2): 50 mg via ORAL
  Filled 2018-03-23 (×2): qty 1

## 2018-03-23 NOTE — Progress Notes (Signed)
ANTICOAGULATION CONSULT NOTE - Initial Consult  Pharmacy Consult for Heparin Indication: Acute renal infarct, subacute splenic infarct, concern for Afib  Allergies  Allergen Reactions  . Latex Hives    TONGUE HAD BLISTERS WHEN HAD DENTAL SURGERY  . Amoxicillin Nausea And Vomiting  . Aspirin Other (See Comments)    Gi upset  . Barbiturates Other (See Comments)    Unknown reaction  . Celebrex [Celecoxib] Other (See Comments)    Unknown reaction  . Codeine Itching  . Evista [Raloxifene] Other (See Comments)    Unknown reaction  . Lipitor [Atorvastatin] Other (See Comments)    Unknown reaction  . Morphine And Related Other (See Comments)    Unknown reaction  . Oruvail [Ketoprofen] Other (See Comments)    Unknown reaction  . Pantoprazole Diarrhea  . Paraffin Other (See Comments)    Unknown reaction  . Prochlorperazine Other (See Comments)    Uncontrolled shaking  . Proloprim [Trimethoprim] Other (See Comments)    Unknown reaction  . Venlafaxine Nausea Only  . Vibra-Tab [Doxycycline] Other (See Comments)    Unknown reaction  . Vioxx [Rofecoxib] Other (See Comments)    edema  . Betadine [Povidone Iodine] Rash  . Caffeine Palpitations    Patient Measurements:    Wt: 53.2 kg Ht: 5'3.5" IBW: 54 kg Heparin Dosing Weight: 53 kg  Vital Signs: Temp: 98.2 F (36.8 C) (09/29 0825) Temp Source: Oral (09/29 0825) BP: 156/76 (09/29 0825) Pulse Rate: 56 (09/29 0825)  Labs: Recent Labs    03/22/18 1309 03/23/18 0355  HGB 11.8*  --   HCT 36.3  --   PLT 231  --   CREATININE 0.87 0.84    CrCl cannot be calculated (Unknown ideal weight.).   Medical History: Past Medical History:  Diagnosis Date  . Anxiety   . Arthritis   . Blood transfusion   . C. difficile colitis   . Candida esophagitis ([place]) 2013   EGD  . Cataract   . Chronic kidney disease    STONES  . Diverticulitis 04/05/2017  . Diverticulosis of colon (without mention of hemorrhage) 2007   Colonoscopy   . Dysrhythmia    RX  . Emphysema of lung ([place])   . Family history of colon cancer    sister  . Fibromyalgia   . Fracture, zygoma closed ([place]) 07/13/2011  . GERD (gastroesophageal reflux disease)   . Headache(784.0)   . Hyperlipemia   . Hypertension   . Kidney stones   . Ptosis, bilateral   . Renal infarct ([place])   . Splenic infarct     Assessment: 80 YOF who presented on 9/28 with abdominal pain with CT showing splenic and acute renal infarcts. Also with concern for Afib. Pharmacy consulted to start Heparin for anticoagulation.  The patient was not on any anticoagulation PTA. Hgb 11.8, plts wnl. No recent surgeries or hx CVA noted.  Goal of Therapy:  Heparin level 0.3-0.7 units/ml Monitor platelets by anticoagulation protocol: Yes   Plan:  - Heparin 1500 unit bolus x 1 - Start Heparin at 800 units/hr (8 ml/hr) - Will continue to monitor for any signs/symptoms of bleeding and will follow up with heparin level in 8 hours   Thank you for allowing pharmacy to be a part of this patient's care.  [name], PharmD, BCPS Clinical Pharmacist Pager: [phone] Clinical phone for 03/23/2018 from 7a-3:30p: [phone] If after 3:30p, please call main pharmacy at: [phone] Please check AMION for all [place] numbers 03/23/2018 1:55 PM
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
## 2018-03-23 NOTE — CV Procedure (Deleted)
Error

## 2018-03-23 NOTE — Progress Notes (Signed)
  Echocardiogram 2D Echocardiogram has been performed.  Debra White T Debra White 03/23/2018, 1:42 PM

## 2018-03-24 ENCOUNTER — Other Ambulatory Visit: Payer: Self-pay | Admitting: Cardiology

## 2018-03-24 DIAGNOSIS — D735 Infarction of spleen: Secondary | ICD-10-CM

## 2018-03-24 DIAGNOSIS — R000 Tachycardia, unspecified: Secondary | ICD-10-CM

## 2018-03-24 DIAGNOSIS — I480 Paroxysmal atrial fibrillation: Secondary | ICD-10-CM

## 2018-03-24 DIAGNOSIS — N280 Ischemia and infarction of kidney: Secondary | ICD-10-CM

## 2018-03-24 DIAGNOSIS — I10 Essential (primary) hypertension: Secondary | ICD-10-CM | POA: Diagnosis not present

## 2018-03-24 DIAGNOSIS — R1084 Generalized abdominal pain: Secondary | ICD-10-CM

## 2018-03-24 DIAGNOSIS — R002 Palpitations: Secondary | ICD-10-CM

## 2018-03-24 LAB — CBC
HEMATOCRIT: 30.7 % — AB (ref 36.0–46.0)
Hemoglobin: 10.1 g/dL — ABNORMAL LOW (ref 12.0–15.0)
MCH: 31.3 pg (ref 26.0–34.0)
MCHC: 32.9 g/dL (ref 30.0–36.0)
MCV: 95 fL (ref 78.0–100.0)
PLATELETS: 173 10*3/uL (ref 150–400)
RBC: 3.23 MIL/uL — ABNORMAL LOW (ref 3.87–5.11)
RDW: 12.7 % (ref 11.5–15.5)
WBC: 6.1 10*3/uL (ref 4.0–10.5)

## 2018-03-24 LAB — BASIC METABOLIC PANEL
Anion gap: 7 (ref 5–15)
BUN: 5 mg/dL — ABNORMAL LOW (ref 8–23)
CALCIUM: 8.2 mg/dL — AB (ref 8.9–10.3)
CHLORIDE: 102 mmol/L (ref 98–111)
CO2: 25 mmol/L (ref 22–32)
CREATININE: 0.73 mg/dL (ref 0.44–1.00)
Glucose, Bld: 91 mg/dL (ref 70–99)
Potassium: 3.8 mmol/L (ref 3.5–5.1)
SODIUM: 134 mmol/L — AB (ref 135–145)

## 2018-03-24 LAB — HEPARIN LEVEL (UNFRACTIONATED)
HEPARIN UNFRACTIONATED: 0.67 [IU]/mL (ref 0.30–0.70)
HEPARIN UNFRACTIONATED: 0.7 [IU]/mL (ref 0.30–0.70)

## 2018-03-24 MED ORDER — TRAMADOL HCL 50 MG PO TABS: 50.0000 mg | ORAL_TABLET | Freq: Four times a day (QID) | ORAL | Status: DC | PRN

## 2018-03-24 MED ORDER — APIXABAN 2.5 MG PO TABS: 2.5000 mg | ORAL_TABLET | Freq: Two times a day (BID) | ORAL | 0 refills | Status: DC

## 2018-03-24 MED ORDER — DICLOFENAC SODIUM 1 % TD GEL: 2.0000 g | Freq: Four times a day (QID) | TRANSDERMAL | Status: DC

## 2018-03-24 MED ORDER — DICLOFENAC SODIUM 1 % TD GEL
2.0000 g | Freq: Four times a day (QID) | TRANSDERMAL | Status: DC
  Filled 2018-03-24: qty 100

## 2018-03-24 MED ORDER — APIXABAN 2.5 MG PO TABS
2.5000 mg | ORAL_TABLET | Freq: Two times a day (BID) | ORAL | Status: DC
  Administered 2018-03-24: 2.5 mg via ORAL
  Filled 2018-03-24: qty 1

## 2018-03-24 MED ORDER — PRAVASTATIN SODIUM 40 MG PO TABS: 20.0000 mg | ORAL_TABLET | Freq: Every evening | ORAL | Status: DC

## 2018-03-24 NOTE — Care Management Note (Signed)
Case Management Note  Patient Details  Name: RHAYNE CHATWIN MRN: [id number] Date of Birth: 01/20/1934  Subjective/Objective:   Patient was in for observation with abdominal pains.                 Action/Plan: Case manager spoke with patient and family concerning discharge plan. Choice for Home Health agency was offwred, referral was called to Carolynn Comment, [place] Liaison.   Expected Discharge Date:  03/24/18               Expected Discharge Plan:   Home with [facility]  In-House Referral:     Discharge planning Services  CM Consult  Post Acute Care Choice:  Home Health Choice offered to:  Patient  DME Arranged:  N/A DME Agency:  NA  HH Arranged:  PT [place] Agency:  [place]  Status of Service:  Completed, signed off  If discussed at [place] of Stay Meetings, dates discussed:    Additional Comments:  Ninfa Meeker, RN 03/24/2018, 5:20 PM

## 2018-03-24 NOTE — Progress Notes (Signed)
ANTICOAGULATION CONSULT NOTE - Initial Consult  Pharmacy Consult for Heparin Indication: Acute renal infarct, subacute splenic infarct, concern for Afib  Allergies  Allergen Reactions  . Latex Hives    TONGUE HAD BLISTERS WHEN HAD DENTAL SURGERY  . Amoxicillin Nausea And Vomiting  . Aspirin Other (See Comments)    Gi upset  . Barbiturates Other (See Comments)    Unknown reaction  . Celebrex [Celecoxib] Other (See Comments)    Unknown reaction  . Codeine Itching  . Evista [Raloxifene] Other (See Comments)    Unknown reaction  . Lipitor [Atorvastatin] Other (See Comments)    Unknown reaction  . Morphine And Related Other (See Comments)    Unknown reaction  . Oruvail [Ketoprofen] Other (See Comments)    Unknown reaction  . Pantoprazole Diarrhea  . Paraffin Other (See Comments)    Unknown reaction  . Prochlorperazine Other (See Comments)    Uncontrolled shaking  . Proloprim [Trimethoprim] Other (See Comments)    Unknown reaction  . Venlafaxine Nausea Only  . Vibra-Tab [Doxycycline] Other (See Comments)    Unknown reaction  . Vioxx [Rofecoxib] Other (See Comments)    edema  . Betadine [Povidone Iodine] Rash  . Caffeine Palpitations    Patient Measurements: Weight: 117 lb 4.6 oz (53.2 kg)  Wt: 53.2 kg Ht: 5'3.5" IBW: 54 kg Heparin Dosing Weight: 53 kg  Vital Signs: Temp: 98.4 F (36.9 C) (09/30 0900) Temp Source: Oral (09/30 0900) BP: 115/70 (09/30 0900) Pulse Rate: 65 (09/30 0900)  Labs: Recent Labs    03/22/18 1309 03/23/18 0355 03/24/18 0114 03/24/18 0935  HGB 11.8*  --  10.1*  --   HCT 36.3  --  30.7*  --   PLT 231  --  173  --   HEPARINUNFRC  --   --  0.67 0.70  CREATININE 0.87 0.84 0.73  --     Estimated Creatinine Clearance: 45.5 mL/min (by C-G formula based on SCr of 0.73 mg/dL).   Medical History: Past Medical History:  Diagnosis Date  . Anxiety   . Arthritis   . Blood transfusion   . C. difficile colitis   . Candida esophagitis ([place])  2013   EGD  . Cataract   . Chronic kidney disease    STONES  . Diverticulitis 04/05/2017  . Diverticulosis of colon (without mention of hemorrhage) 2007   Colonoscopy  . Dysrhythmia    RX  . Emphysema of lung ([place])   . Family history of colon cancer    sister  . Fibromyalgia   . Fracture, zygoma closed ([place]) 07/13/2011  . GERD (gastroesophageal reflux disease)   . Headache(784.0)   . Hyperlipemia   . Hypertension   . Kidney stones   . Ptosis, bilateral   . Renal infarct ([place])   . Splenic infarct     Assessment: 15 YOF who presented on 9/28 with abdominal pain with CT showing splenic and acute renal infarcts. Also with concern for Afib. Pharmacy consulted to start Heparin for anticoagulation.  Heparin level = 0.7 on 800 units/hr. No bleeding per chart.  Goal of Therapy:  Heparin level 0.3-0.7 units/ml Monitor platelets by anticoagulation protocol: Yes   Plan:  - Continue Heparin 800 units/hr (8 ml/hr) - Will continue to monitor for any signs/symptoms of bleeding  - Daily Heparin level  Tauriel Scronce A. Levada Dy, PharmD, [place] Pager: [phone] Please utilize Amion for appropriate phone number to reach the unit pharmacist ([place])    03/24/2018  12:02 PM

## 2018-03-24 NOTE — Progress Notes (Signed)
PT Cancellation Note  Patient Details Name: EMMILY PELLEGRIN MRN: [id number] DOB: [birth year]   Cancelled Treatment:    Reason Eval/Treat Not Completed: Other (comment)   Attempted PT eval, pt requesting pain meds;   Will reattempt;   Roney [place], PT  Acute Rehabilitation Services Pager [phone] Office [phone]    [place] 03/24/2018, 12:07 PM

## 2018-03-24 NOTE — Discharge Summary (Signed)
Physician Discharge Summary  Debra White RSW:[id number] DOB: 20-Jan-1934 DOA: 03/22/2018  PCP: Unk Pinto, MD  Admit date: 03/22/2018 Discharge date: 03/24/2018  Admitted From: home Discharge disposition: home with outpt PT recommended   Recommendations for Outpatient Follow-Up:   1. Follow-up 1-2 weeks with PCP. Event monitor to be arranged by cardiology. Starting Eliquis at discharge (risk benefits analysis will need to be done regularly if she is to continue on eliquis) 2. BMET at PCP visit to f/u on Na+ and K+  Discharge Diagnosis:   Principal Problem:   Renal infarct [facility]) Active Problems:   Hypertension   COPD (chronic obstructive pulmonary disease) with emphysema (HCC)   Splenic infarct   Chronic hyponatremia   Chronic hyperkalemia   Right sided numbness   Discharge Condition: Improved.  Diet recommendation: Low sodium, heart healthy.   Wound care: None.  Code status: DNR   History of Present Illness:   84yo female with pmh of htn and anxiety who presented with complaints of abdominal pain waxing and waning over last week. Denied any recent fever, chills, reported occasional nausea. Found on CT to have subacute/acute splenic and renal infarcts. No known hx of afib, but often feels "heart racing" and put on atenolol years ago for "fast HR".    Hospital Course by Problem:   Abdominal pain in setting of acute splenic and renal infarcts -found to have subacute or chronic and acute splenic infarctions as well as right renal infarct. -Suspect embolic with concern for afib. pt describes frequent palpitations and "heart racing". States she was put on atenolol years ago for rapid HR. -NSR while on telemetry  -MRI brain shows old right occipital lobe/PCA territory infarct new since 2018. No acute findings -2Decho EF 55-60%, no WMAs, no source of emboli -Heparin gtt while in hospital. Will d/c on Eliquis given recent infarctions. Cardiology to arrange event  monitor and follow-up for suspected a fib (Mali vasc 2 would be at least at 6)  Right-sided numbness -chronic in RUE/RLE attributed to OA and right knee and shoulder tendinopathy. MRI brain shows old RIGHT infarcts  -PT recommending outpt PT follow-up  Hypertension -good control on home dose atenolol, losartan  Hyponatremia and hyperkalmia, mild -improved with IVFs, ARB held 24 hours. Per notes, appeared hypovolemic on admit -recheck as an outpateint  Anxiety -stable. Continue home Klonopin    Medical Consultants:   none   Discharge Exam:   Vitals:   03/24/18 0900 03/24/18 1638  BP: 115/70 (!) 167/71  Pulse: 65 (!) 55  Resp: 18 18  Temp: 98.4 F (36.9 C)   SpO2: 100% 100%   Vitals:   03/24/18 0506 03/24/18 0900 03/24/18 1600 03/24/18 1638  BP: 108/68 115/70  (!) 167/71  Pulse: 62 65  (!) 55  Resp: 18 18  18   Temp: 98.5 F (36.9 C) 98.4 F (36.9 C)    TempSrc:  Oral    SpO2: 100% 100%  100%  Weight:      Height:   5' 3.5" (1.613 m)     General exam: Appears calm and comfortable. Family at bedside Respiratory system: Clear to auscultation. Respiratory effort normal. Cardiovascular system: S1 & S2 heard, RRR. No JVD,  rubs, gallops or clicks. No murmurs. Gastrointestinal system: Abdomen is nondistended, soft and nontender. No organomegaly or masses felt. Normal bowel sounds heard. Central nervous system: Alert     The results of significant diagnostics from this hospitalization (including imaging, microbiology, ancillary and laboratory)  are listed below for reference.     Procedures and Diagnostic Studies:   Mr Brain Wo Contrast  Result Date: 03/23/2018 CLINICAL DATA:  RIGHT-sided numbness. History of hypertension, hyperlipidemia. EXAM: MRI HEAD WITHOUT CONTRAST TECHNIQUE: Multiplanar, multiecho pulse sequences of the brain and surrounding structures were obtained without intravenous contrast. COMPARISON:  MRI head September 15, 2017 FINDINGS: INTRACRANIAL  CONTENTS: No reduced diffusion to suggest acute ischemia. No susceptibility artifact to suggest hemorrhage. The ventricles and sulci are normal for patient's age. Small area RIGHT occipital lobe encephalomalacia new from prior MRI. Scattered subcentimeter supratentorial white matter FLAIR T2 hyperintensities compatible with mild chronic small vessel ischemic changes, less than expected for age. No suspicious parenchymal signal, masses, mass effect. No abnormal extra-axial fluid collections. No extra-axial masses. VASCULAR: Normal major intracranial vascular flow voids present at skull base. SKULL AND UPPER CERVICAL SPINE: No abnormal sellar expansion. No suspicious calvarial bone marrow signal. ACDF. Craniocervical junction maintained. SINUSES/ORBITS: The mastoid air-cells and included paranasal sinuses are well-aerated.The included ocular globes and orbital contents are non-suspicious. Status post bilateral ocular lens implants. OTHER: None. IMPRESSION: 1. No acute intracranial process. 2. Old small RIGHT occipital lobe/PCA territory infarct new from 2018. 3. Mild chronic small vessel ischemic changes. Electronically Signed   By: Elon Alas M.D.   On: 03/23/2018 06:34   Ct Abdomen Pelvis W Contrast  Result Date: 03/22/2018 CLINICAL DATA:  Severe lower abdominal and right flank pain. History of diverticulitis. EXAM: CT ABDOMEN AND PELVIS WITH CONTRAST TECHNIQUE: Multidetector CT imaging of the abdomen and pelvis was performed using the standard protocol following bolus administration of intravenous contrast. CONTRAST:  117mL OMNIPAQUE IOHEXOL 300 MG/ML  SOLN COMPARISON:  09/08/2017 CT abdomen/pelvis. FINDINGS: Lower chest: No significant pulmonary nodules or acute consolidative airspace disease. Coronary atherosclerosis. Hepatobiliary: Normal liver size. No liver mass. Normal gallbladder with no radiopaque cholelithiasis. No biliary ductal dilatation. Pancreas: Normal, with no mass or duct dilation.  Spleen: There is a wedge-shaped focus of low attenuation in the inferior spleen (series 3/image 20), new, compatible with acute splenic infarct. There is a curvilinear focus of low attenuation in the superior spleen, new, compatible with subacute/chronic infarct. Adrenals/Urinary Tract: Normal adrenals. New small poorly defined low-attenuation focus in the posterior lower right kidney (series 8/image 21), suggestive of renal cortical infarct. Subcentimeter hypodense renal cortical lesions in the left kidney are too small to characterize and are stable, considered benign. No hydronephrosis. Normal bladder. Stomach/Bowel: Normal non-distended stomach. Normal caliber small bowel with no small bowel wall thickening. Appendix not discretely visualized. No pericecal inflammatory changes. Marked sigmoid diverticulosis, with no large bowel wall thickening or significant pericolonic fat stranding. Vascular/Lymphatic: Atherosclerotic nonaneurysmal abdominal aorta. Patent portal, splenic, hepatic and renal veins. No pathologically enlarged lymph nodes in the abdomen or pelvis. Reproductive: Status post hysterectomy, with no abnormal findings at the vaginal cuff. No adnexal mass. Other: No pneumoperitoneum, ascites or focal fluid collection. Musculoskeletal: No aggressive appearing focal osseous lesions. Ankylosis at L4-5 with cerclage wire overlying the posterior lower lumbar spine elements. Moderate lumbar spondylosis. IMPRESSION: 1. Wedge-shaped focus of low attenuation in the inferior spleen, new, compatible with acute splenic infarct. Curvilinear focus of low attenuation in the superior spleen, new, compatible with subacute/chronic infarct. 2. Nonspecific new small poorly defined low-attenuation focus in the posterior lower right kidney, suggestive of acute renal cortical infarct. 3. Consider echocardiographic correlation to exclude a cardiac source of emboli. 4. Marked sigmoid diverticulosis. No evidence of acute  diverticulitis. 5.  Aortic Atherosclerosis (ICD10-I70.0). 6.  Coronary atherosclerosis. Electronically Signed   By: Ilona Sorrel M.D.   On: 03/22/2018 18:51     Labs:   Basic Metabolic Panel: Recent Labs  Lab 03/22/18 1309 03/23/18 0355 03/24/18 0114  NA 131* 132* 134*  K 5.2* 3.9 3.8  CL 94* 96* 102  CO2 28 26 25   GLUCOSE 98 85 91  BUN 7* 6* 5*  CREATININE 0.87 0.84 0.73  CALCIUM 9.3 8.9 8.2*   GFR Estimated Creatinine Clearance: 45.5 mL/min (by C-G formula based on SCr of 0.73 mg/dL). Liver Function Tests: Recent Labs  Lab 03/22/18 1309  AST 31  ALT 21  ALKPHOS 68  BILITOT 0.6  PROT 7.2  ALBUMIN 4.3   Recent Labs  Lab 03/22/18 1309  LIPASE 42   No results for input(s): AMMONIA in the last 168 hours. Coagulation profile No results for input(s): INR, PROTIME in the last 168 hours.  CBC: Recent Labs  Lab 03/22/18 1309 03/24/18 0114  WBC 8.5 6.1  HGB 11.8* 10.1*  HCT 36.3 30.7*  MCV 96.8 95.0  PLT 231 173   Cardiac Enzymes: No results for input(s): CKTOTAL, CKMB, CKMBINDEX, TROPONINI in the last 168 hours. BNP: Invalid input(s): POCBNP CBG: No results for input(s): GLUCAP in the last 168 hours. D-Dimer No results for input(s): DDIMER in the last 72 hours. Hgb A1c No results for input(s): HGBA1C in the last 72 hours. Lipid Profile No results for input(s): CHOL, HDL, LDLCALC, TRIG, CHOLHDL, LDLDIRECT in the last 72 hours. Thyroid function studies No results for input(s): TSH, T4TOTAL, T3FREE, THYROIDAB in the last 72 hours.  Invalid input(s): FREET3 Anemia work up No results for input(s): VITAMINB12, FOLATE, FERRITIN, TIBC, IRON, RETICCTPCT in the last 72 hours. Microbiology No results found for this or any previous visit (from the past 240 hour(s)).   Discharge Instructions:   Discharge Instructions    Diet - low sodium heart healthy   Complete by:  As directed    Discharge instructions   Complete by:  As directed    Outpatient  PT Referral to cardiology and 30 day event monitor to look for a fib   Discharge instructions   Complete by:  As directed    Fall precautions   Increase activity slowly   Complete by:  As directed    Increase activity slowly   Complete by:  As directed      Allergies as of 03/24/2018      Reactions   Latex Hives   TONGUE HAD BLISTERS WHEN HAD DENTAL SURGERY   Amoxicillin Nausea And Vomiting   Aspirin Other (See Comments)   Gi upset   Barbiturates Other (See Comments)   Unknown reaction   Celebrex [celecoxib] Other (See Comments)   Unknown reaction   Codeine Itching   Evista [raloxifene] Other (See Comments)   Unknown reaction   Lipitor [atorvastatin] Other (See Comments)   Unknown reaction   Morphine And Related Other (See Comments)   Unknown reaction   Oruvail [ketoprofen] Other (See Comments)   Unknown reaction   Pantoprazole Diarrhea   Paraffin Other (See Comments)   Unknown reaction   Prochlorperazine Other (See Comments)   Uncontrolled shaking   Proloprim [trimethoprim] Other (See Comments)   Unknown reaction   Venlafaxine Nausea Only   Vibra-tab [doxycycline] Other (See Comments)   Unknown reaction   Vioxx [rofecoxib] Other (See Comments)   edema   Betadine [povidone Iodine] Rash   Caffeine Palpitations      Medication List  TAKE these medications   acetaminophen 500 MG tablet Commonly known as:  TYLENOL Take 500-1,000 mg by mouth every 6 (six) hours as needed for headache.   apixaban 2.5 MG Tabs tablet Commonly known as:  ELIQUIS Take 1 tablet (2.5 mg total) by mouth 2 (two) times daily.   ARTIFICIAL TEARS 1.4 % ophthalmic solution Generic drug:  polyvinyl alcohol Place 1 drop into both eyes daily as needed for dry eyes.   atenolol 100 MG tablet Commonly known as:  TENORMIN Take 1 tablet daily for BP What changed:    how much to take  how to take this  when to take this  additional instructions   clonazePAM 1 MG tablet Commonly known  as:  KLONOPIN TAKE 1/2 TO 1 TABLET BY MOUTH 2-3 TIMES DAILY ONLY IF NEEDED FOR ANXIETY ATTACK AND LIMIT TO 5 DAYS PER WEEK TO AVOID ADDICTION What changed:    how much to take  how to take this  when to take this  additional instructions   diclofenac sodium 1 % Gel Commonly known as:  VOLTAREN Apply 2 g topically 4 (four) times daily. What changed:    how much to take  when to take this  reasons to take this   dicyclomine 10 MG capsule Commonly known as:  BENTYL Take 10 mg by mouth daily.   gabapentin 600 MG tablet Commonly known as:  NEURONTIN Take 1/2 to 1 tablet s 3 to 4 x / day as needed for Facial Pain What changed:    how much to take  how to take this  when to take this  additional instructions   hyoscyamine 0.125 MG SL tablet Commonly known as:  LEVSIN SL Dissolve 1 tablet under tongue every 4 hours as needed for Nausea, Cramping , bloating or Diarrhea What changed:    how much to take  how to take this  when to take this  reasons to take this  additional instructions   losartan 100 MG tablet Commonly known as:  COZAAR TAKE 1/2 TABLET BY MOUTH DAILY What changed:  when to take this   pravastatin 40 MG tablet Commonly known as:  PRAVACHOL Take 0.5 tablets (20 mg total) by mouth every evening.   ranitidine 300 MG tablet Commonly known as:  ZANTAC TAKE 1 TABLET BY MOUTH TWICE DAILY AS NEEDED FOR ACID INDIGESTION ANDREFLUX What changed:    how much to take  how to take this  when to take this  reasons to take this  additional instructions   traMADol 50 MG tablet Commonly known as:  ULTRAM TAKE 1 TABLET BY MOUTH 4 TIMES DAILY AS NEEDED FOR PAIN What changed:    how much to take  how to take this  when to take this  reasons to take this  additional instructions   Vitamin D 2000 units tablet Take 4,000 Units by mouth daily.      Follow-up Information    Unk Pinto, MD Follow up in 1 week(s).   Specialty:   Internal Medicine Contact information: [id number] [place] [phone]        referral made to cardiology for 30 day event monitor and follow up Follow up.            Time coordinating discharge: 28min  Signed:  [place]

## 2018-03-24 NOTE — Evaluation (Signed)
Physical Therapy Evaluation Patient Details Name: Debra White MRN: [id number] DOB: November 27, 1933 Today's Date: 03/24/2018   History of Present Illness  Stephanieann A Boniface is a 84 y.o. female with medical history significant for anxiety, osteoarthritis, right rotator cuff tendinopathy, and hypertension, now presenting to the emergency department for evaluation of abdominal and right flank pain.  Patient reports pain in the mid and lower abdomen, as well as right flank, waxing and waning for the past week.  Pain is severe at times, associated with some occasional nausea and one episode of nonbloody vomiting. CT the abdomen and pelvis was performed and notable for subacute or chronic appearing splenic infarct, acute appearing splenic infarct, and an acute appearing right renal infarct  Clinical Impression   Pt admitted with above diagnosis. Pt currently with functional limitations due to the deficits listed below (see PT Problem List). Independent with walking and mobility prior to admission, grocery shopping; Presents with mild balance deficits, but does have a history of 2 falls (over a year ago) resulting in rib fx and facial injuries; Starting on a blood thinner, due to afib contributing to embolic renal and splenic infarcts; Want to maximize safety and minimize fall risk with the use of anticoagulation;  Pt will benefit from skilled PT to increase their independence and safety with mobility to allow discharge to the venue listed below.       Follow Up Recommendations Outpatient PT for balance    Equipment Recommendations  (Will consider cane)    Recommendations for Other Services       Precautions / Restrictions Precautions Precautions: None      Mobility  Bed Mobility Overal bed mobility: Independent                Transfers Overall transfer level: Modified independent Equipment used: None             General transfer comment: No  difficulty  Ambulation/Gait Ambulation/Gait assistance: Supervision Gait Distance (Feet): 300 Feet Assistive device: None Gait Pattern/deviations: Step-through pattern     General Gait Details: Overall smooth gait with occasional toe catch (wearing a sneaker-type shoe), but no gross lossof balance noted  Stairs            Wheelchair Mobility    Modified Rankin (Stroke Patients Only)       Balance Overall balance assessment: Mild deficits observed, not formally tested                                           Pertinent Vitals/Pain Pain Assessment: Faces Faces Pain Scale: Hurts a little bit Pain Location: Pain in shoulders and back Pain Descriptors / Indicators: Aching(Chronic) Pain Intervention(s): Premedicated before session;Monitored during session    Home Living Family/patient expects to be discharged to:: Private residence Living Arrangements: Spouse/significant other Available Help at Discharge: Family;Available 24 hours/day Type of Home: House Home Access: Ramped entrance     Home Layout: One level Home Equipment: Walker - 2 wheels      Prior Function Level of Independence: Independent         Comments: Still drives, grocery shops, etc; no assistive device; 2 falls over a year ago, reultin gin broken ribs and facial injuries     Hand Dominance        Extremity/Trunk Assessment   Upper Extremity Assessment Upper Extremity Assessment: Overall WFL for tasks assessed  Lower Extremity Assessment Lower Extremity Assessment: Overall WFL for tasks assessed       Communication   Communication: No difficulties  Cognition Arousal/Alertness: Awake/alert Behavior During Therapy: WFL for tasks assessed/performed Overall Cognitive Status: Within Functional Limits for tasks assessed(For simple mobility tasks)                                        General Comments      Exercises     Assessment/Plan    PT  Assessment Patient needs continued PT services  PT Problem List Decreased activity tolerance;Decreased balance;Decreased knowledge of use of DME       PT Treatment Interventions DME instruction;Gait training;Stair training;Functional mobility training;Balance training;Patient/family education;Therapeutic activities;Therapeutic exercise;Neuromuscular re-education    PT Goals (Current goals can be found in the Care Plan section)  Acute Rehab PT Goals Patient Stated Goal: Was hoping to go home soon PT Goal Formulation: With patient Time For Goal Achievement: 03/31/18 Potential to Achieve Goals: Good    Frequency Min 3X/week   Barriers to discharge        Co-evaluation               AM-PAC PT "6 Clicks" Daily Activity  Outcome Measure Difficulty turning over in bed (including adjusting bedclothes, sheets and blankets)?: None Difficulty moving from lying on back to sitting on the side of the bed? : None Difficulty sitting down on and standing up from a chair with arms (e.g., wheelchair, bedside commode, etc,.)?: None Help needed moving to and from a bed to chair (including a wheelchair)?: None Help needed walking in hospital room?: None Help needed climbing 3-5 steps with a railing? : A Little 6 Click Score: 23    End of Session Equipment Utilized During Treatment: Gait belt Activity Tolerance: Patient tolerated treatment well Patient left: in chair;with call bell/phone within reach;Other (comment)(beginning lunch)   PT Visit Diagnosis: Other abnormalities of gait and mobility (R26.89)    Time: 5102-5852 PT Time Calculation (min) (ACUTE ONLY): 22 min   Charges:   PT Evaluation $PT Eval Low Complexity: [place], PT  Acute Rehabilitation Services Pager [phone] Office [place] 03/24/2018, 1:41 PM

## 2018-03-24 NOTE — Progress Notes (Signed)
ANTICOAGULATION CONSULT NOTE - Initial Consult  Pharmacy Consult for Heparin Indication: Acute renal infarct, subacute splenic infarct, concern for Afib  Allergies  Allergen Reactions  . Latex Hives    TONGUE HAD BLISTERS WHEN HAD DENTAL SURGERY  . Amoxicillin Nausea And Vomiting  . Aspirin Other (See Comments)    Gi upset  . Barbiturates Other (See Comments)    Unknown reaction  . Celebrex [Celecoxib] Other (See Comments)    Unknown reaction  . Codeine Itching  . Evista [Raloxifene] Other (See Comments)    Unknown reaction  . Lipitor [Atorvastatin] Other (See Comments)    Unknown reaction  . Morphine And Related Other (See Comments)    Unknown reaction  . Oruvail [Ketoprofen] Other (See Comments)    Unknown reaction  . Pantoprazole Diarrhea  . Paraffin Other (See Comments)    Unknown reaction  . Prochlorperazine Other (See Comments)    Uncontrolled shaking  . Proloprim [Trimethoprim] Other (See Comments)    Unknown reaction  . Venlafaxine Nausea Only  . Vibra-Tab [Doxycycline] Other (See Comments)    Unknown reaction  . Vioxx [Rofecoxib] Other (See Comments)    edema  . Betadine [Povidone Iodine] Rash  . Caffeine Palpitations    Patient Measurements: Weight: 117 lb 4.6 oz (53.2 kg)  Wt: 53.2 kg Ht: 5'3.5" IBW: 54 kg Heparin Dosing Weight: 53 kg  Vital Signs: Temp: 97.9 F (36.6 C) (09/29 2050) Temp Source: Oral (09/29 2050) BP: 161/74 (09/29 2050) Pulse Rate: 57 (09/29 2050)  Labs: Recent Labs    03/22/18 1309 03/23/18 0355 03/24/18 0114  HGB 11.8*  --  10.1*  HCT 36.3  --  30.7*  PLT 231  --  173  HEPARINUNFRC  --   --  0.67  CREATININE 0.87 0.84 0.73    Estimated Creatinine Clearance: 45.5 mL/min (by C-G formula based on SCr of 0.73 mg/dL).   Medical History: Past Medical History:  Diagnosis Date  . Anxiety   . Arthritis   . Blood transfusion   . C. difficile colitis   . Candida esophagitis ([place]) 2013   EGD  . Cataract   . Chronic  kidney disease    STONES  . Diverticulitis 04/05/2017  . Diverticulosis of colon (without mention of hemorrhage) 2007   Colonoscopy  . Dysrhythmia    RX  . Emphysema of lung ([place])   . Family history of colon cancer    sister  . Fibromyalgia   . Fracture, zygoma closed ([place]) 07/13/2011  . GERD (gastroesophageal reflux disease)   . Headache(784.0)   . Hyperlipemia   . Hypertension   . Kidney stones   . Ptosis, bilateral   . Renal infarct ([place])   . Splenic infarct     Assessment: 83 YOF who presented on 9/28 with abdominal pain with CT showing splenic and acute renal infarcts. Also with concern for Afib. Pharmacy consulted to start Heparin for anticoagulation.  Heparin level = 0.67 on 800 units/hr. No bleeding per chart.  Goal of Therapy:  Heparin level 0.3-0.7 units/ml Monitor platelets by anticoagulation protocol: Yes   Plan:  - Continue Heparin 800 units/hr (8 ml/hr) - Will continue to monitor for any signs/symptoms of bleeding  - will follow up with heparin level at 1000 to confirm.   Thank you for allowing pharmacy to be a part of this patient's care.  Maryanna Shape, PharmD, BCPS, BCPPS Clinical Pharmacist  Pager: [phone]   03/24/2018 1:43 AM

## 2018-03-24 NOTE — Progress Notes (Signed)
ANTICOAGULATION CONSULT NOTE - Follow Up Consult  Pharmacy Consult for Heparin -> Eliquis Indication: atrial fibrillation  Allergies  Allergen Reactions  . Latex Hives    TONGUE HAD BLISTERS WHEN HAD DENTAL SURGERY  . Amoxicillin Nausea And Vomiting  . Aspirin Other (See Comments)    Gi upset  . Barbiturates Other (See Comments)    Unknown reaction  . Celebrex [Celecoxib] Other (See Comments)    Unknown reaction  . Codeine Itching  . Evista [Raloxifene] Other (See Comments)    Unknown reaction  . Lipitor [Atorvastatin] Other (See Comments)    Unknown reaction  . Morphine And Related Other (See Comments)    Unknown reaction  . Oruvail [Ketoprofen] Other (See Comments)    Unknown reaction  . Pantoprazole Diarrhea  . Paraffin Other (See Comments)    Unknown reaction  . Prochlorperazine Other (See Comments)    Uncontrolled shaking  . Proloprim [Trimethoprim] Other (See Comments)    Unknown reaction  . Venlafaxine Nausea Only  . Vibra-Tab [Doxycycline] Other (See Comments)    Unknown reaction  . Vioxx [Rofecoxib] Other (See Comments)    edema  . Betadine [Povidone Iodine] Rash  . Caffeine Palpitations    Patient Measurements: Height: 5' 3.5" (161.3 cm) Weight: 117 lb 4.6 oz (53.2 kg) IBW/kg (Calculated) : 53.55 Heparin Dosing Weight: 53 kg  Vital Signs: Temp: 98.4 F (36.9 C) (09/30 0900) Temp Source: Oral (09/30 0900) BP: 115/70 (09/30 0900) Pulse Rate: 65 (09/30 0900)  Labs: Recent Labs    03/22/18 1309 03/23/18 0355 03/24/18 0114 03/24/18 0935  HGB 11.8*  --  10.1*  --   HCT 36.3  --  30.7*  --   PLT 231  --  173  --   HEPARINUNFRC  --   --  0.67 0.70  CREATININE 0.87 0.84 0.73  --     Estimated Creatinine Clearance: 45.5 mL/min (by C-G formula based on SCr of 0.73 mg/dL).  Assessment:  84 YOF who presented on 9/28 with abdominal pain with CT showing splenic and acute renal infarcts. Also with concern for Afib. Pharmacy consulted to start Heparin  for anticoagulation, now to transition to Eliquis.  Last heparin level was upper-range therapeutic (0.70) this morning on 800 units/hr.    84 yrs old, 53.5, Scr 0.73.   Two qualifiers for Eliquis dose reduction.  Goal of Therapy:  Heparin level 0.3-0.7 units/ml appropritate Eliquis dose for indication Monitor platelets by anticoagulation protocol: Yes   Plan:   Begin Eliquis 2.5 mg PO BID tonight.  Stop IV heparin when giving first dose of Eliquis.  Monitor for any s/sx bleeding.  Eliquis education prior to discharge.  Arty Baumgartner, [place] Pager: [phone] or phone:  [phone] 03/24/2018,4:05 PM

## 2018-03-25 ENCOUNTER — Telehealth: Payer: Self-pay | Admitting: *Deleted

## 2018-03-25 DIAGNOSIS — I10 Essential (primary) hypertension: Secondary | ICD-10-CM | POA: Diagnosis not present

## 2018-03-25 DIAGNOSIS — G8929 Other chronic pain: Secondary | ICD-10-CM | POA: Diagnosis not present

## 2018-03-25 DIAGNOSIS — K219 Gastro-esophageal reflux disease without esophagitis: Secondary | ICD-10-CM | POA: Diagnosis not present

## 2018-03-25 DIAGNOSIS — M19011 Primary osteoarthritis, right shoulder: Secondary | ICD-10-CM | POA: Diagnosis not present

## 2018-03-25 DIAGNOSIS — M76891 Other specified enthesopathies of right lower limb, excluding foot: Secondary | ICD-10-CM | POA: Diagnosis not present

## 2018-03-25 DIAGNOSIS — D735 Infarction of spleen: Secondary | ICD-10-CM | POA: Diagnosis not present

## 2018-03-25 DIAGNOSIS — M1711 Unilateral primary osteoarthritis, right knee: Secondary | ICD-10-CM | POA: Diagnosis not present

## 2018-03-25 DIAGNOSIS — J439 Emphysema, unspecified: Secondary | ICD-10-CM | POA: Diagnosis not present

## 2018-03-25 DIAGNOSIS — I700 Atherosclerosis of aorta: Secondary | ICD-10-CM | POA: Diagnosis not present

## 2018-03-25 DIAGNOSIS — I499 Cardiac arrhythmia, unspecified: Secondary | ICD-10-CM | POA: Diagnosis not present

## 2018-03-25 DIAGNOSIS — M7591 Shoulder lesion, unspecified, right shoulder: Secondary | ICD-10-CM | POA: Diagnosis not present

## 2018-03-25 DIAGNOSIS — E785 Hyperlipidemia, unspecified: Secondary | ICD-10-CM | POA: Diagnosis not present

## 2018-03-25 DIAGNOSIS — E871 Hypo-osmolality and hyponatremia: Secondary | ICD-10-CM | POA: Diagnosis not present

## 2018-03-25 DIAGNOSIS — Z8673 Personal history of transient ischemic attack (TIA), and cerebral infarction without residual deficits: Secondary | ICD-10-CM | POA: Diagnosis not present

## 2018-03-25 DIAGNOSIS — N280 Ischemia and infarction of kidney: Secondary | ICD-10-CM | POA: Diagnosis not present

## 2018-03-25 DIAGNOSIS — Z7901 Long term (current) use of anticoagulants: Secondary | ICD-10-CM | POA: Diagnosis not present

## 2018-03-25 DIAGNOSIS — F419 Anxiety disorder, unspecified: Secondary | ICD-10-CM | POA: Diagnosis not present

## 2018-03-25 DIAGNOSIS — M797 Fibromyalgia: Secondary | ICD-10-CM | POA: Diagnosis not present

## 2018-03-25 DIAGNOSIS — E875 Hyperkalemia: Secondary | ICD-10-CM | POA: Diagnosis not present

## 2018-03-25 DIAGNOSIS — I2510 Atherosclerotic heart disease of native coronary artery without angina pectoris: Secondary | ICD-10-CM | POA: Diagnosis not present

## 2018-03-25 NOTE — Telephone Encounter (Signed)
Called patient on 03/25/2018 , 10:38 AM in an attempt to reach the patient for a hospital follow up.   Admit date: 03/22/18 Discharge: 03/24/18   She DOES NOT have any questions or concerns about medications from the hospital admission. The patient's medications were reviewed over the phone, they were counseled to bring in all current medications to the hospital follow up visit.   I advised the patient to call if any questions or concerns arise about the hospital admission or medications    Home health WAS started in the hospital.  All questions were answered and a follow up appointment was made.   Prior to Admission medications   Medication Sig Start Date End Date Taking? Authorizing Provider  acetaminophen (TYLENOL) 500 MG tablet Take 500-1,000 mg by mouth every 6 (six) hours as needed for headache.    [provider]  apixaban (ELIQUIS) 2.5 MG TABS tablet Take 1 tablet (2.5 mg total) by mouth 2 (two) times daily. 03/24/18   Geradine Girt, DO  atenolol (TENORMIN) 100 MG tablet Take 1 tablet daily for BP Patient taking differently: Take 50 mg by mouth 2 (two) times daily.  09/27/17   Unk Pinto, MD  Cholecalciferol (VITAMIN D) 2000 units tablet Take 4,000 Units by mouth daily.     [provider]  clonazePAM (KLONOPIN) 1 MG tablet TAKE 1/2 TO 1 TABLET BY MOUTH 2-3 TIMES DAILY ONLY IF NEEDED FOR ANXIETY ATTACK AND LIMIT TO 5 DAYS PER WEEK TO AVOID ADDICTION Patient taking differently: Take 0.5 mg by mouth 2 (two) times daily.  03/19/18   Unk Pinto, MD  diclofenac sodium (VOLTAREN) 1 % GEL Apply 2 g topically 4 (four) times daily. 03/24/18   Geradine Girt, DO  dicyclomine (BENTYL) 10 MG capsule Take 10 mg by mouth daily. 03/10/18   [provider]  gabapentin (NEURONTIN) 600 MG tablet Take 1/2 to 1 tablet s 3 to 4 x / day as needed for Facial Pain Patient taking differently: Take 600 mg by mouth 2 (two) times daily with a meal.  11/14/17 11/15/18  Unk Pinto, MD  hyoscyamine (LEVSIN SL) 0.125 MG SL tablet Dissolve 1 tablet under tongue every 4 hours as needed for Nausea, Cramping , bloating or Diarrhea Patient taking differently: Place 0.125 mg under the tongue every 4 (four) hours as needed for cramping (nausea, bloating, diarrhea).  03/07/18   Unk Pinto, MD  losartan (COZAAR) 100 MG tablet TAKE 1/2 TABLET BY MOUTH DAILY Patient taking differently: Take 50 mg by mouth every evening.  06/07/17   Unk Pinto, MD  polyvinyl alcohol (ARTIFICIAL TEARS) 1.4 % ophthalmic solution Place 1 drop into both eyes daily as needed for dry eyes.    [provider]  pravastatin (PRAVACHOL) 40 MG tablet Take 0.5 tablets (20 mg total) by mouth every evening. 03/24/18   Geradine Girt, DO  ranitidine (ZANTAC) 300 MG tablet TAKE 1 TABLET BY MOUTH TWICE DAILY AS NEEDED FOR ACID INDIGESTION ANDREFLUX Patient taking differently: Take 300 mg by mouth 2 (two) times daily as needed (acid indigestion/reflux).  02/26/18   Unk Pinto, MD  traMADol (ULTRAM) 50 MG tablet TAKE 1 TABLET BY MOUTH 4 TIMES DAILY AS NEEDED FOR PAIN Patient taking differently: Take 50 mg by mouth 4 (four) times daily as needed (pain).  08/12/17   Vicie Mutters, PA-C  omeprazole (PRILOSEC OTC) 20 MG tablet Take 20 mg by mouth daily.  09/01/11  [provider]

## 2018-03-26 ENCOUNTER — Encounter: Payer: Self-pay | Admitting: Internal Medicine

## 2018-03-26 ENCOUNTER — Other Ambulatory Visit: Payer: Self-pay | Admitting: Internal Medicine

## 2018-03-26 DIAGNOSIS — I2510 Atherosclerotic heart disease of native coronary artery without angina pectoris: Secondary | ICD-10-CM | POA: Insufficient documentation

## 2018-03-26 DIAGNOSIS — I2583 Coronary atherosclerosis due to lipid rich plaque: Secondary | ICD-10-CM

## 2018-03-26 NOTE — Progress Notes (Signed)
Debra White     This very nice 84 y.o. MWF was admitted to the hospital on 03/22/2018 and patient was discharged from the hospital on 03/24/2018. The patient now presents for follow up for transition from recent hospitalization.  The day after discharge  our clinical staff contacted the patient to assure stability and schedule a follow up appointment. The discharge summary, medications and diagnostic test results were reviewed before meeting with the patient. The patient was admitted for:   Renal infarct [facility]) Splenic infarct Essential hypertension Pulmonary emphysema, unspecified emphysema type (HCC) Chronic hyponatremia Chronic hyperkalemia Right sided numbness Aortic atherosclerosis (HCC) Coronary atherosclerosis due to lipid rich plaque     This nice 84 yo MWF was admitted w/Abdominal Pain & found by CT scans to have Subacute / chronic splenic & Rt Renal infarcts treated initially w/Heparin & transitioned to Eliquis. 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 Telemetry was unremarkable and she is scheduled for out-pt cardiac event  monitoring to evaluate for possible pAfib (if positive she would be CHA2DS2vasc - 6). Initially her sodium was low & potassium high which both corrected w/IVF.      Hospitalization discharge instructions and medications are reconciled with the patient.      Patient is also followed with Hypertension, Hyperlipidemia, Pre-Diabetes and Vitamin D Deficiency.      Patient is treated for HTN & BP has been controlled at home. Today's BP is at goal - 136/76. Patient has had no complaints of any cardiac type chest pain, palpitations, dyspnea/orthopnea/PND, dizziness, claudication, or dependent edema.     Hyperlipidemia is controlled with diet & meds. Patient denies myalgias or other med SE's. Last Lipids were at goal albeit sl elevated Trig's: Lab Results  Component Value Date   CHOL  196 02/26/2018   HDL 69 02/26/2018   LDLCALC 99 02/26/2018   TRIG 184 (H) 02/26/2018   CHOLHDL 2.8 02/26/2018      Also, the patient has history of PreDiabetes and has had no symptoms of reactive hypoglycemia, diabetic polys, paresthesias or visual blurring.  Last A1c was near goal: Lab Results  Component Value Date   HGBA1C 5.8 (H) 02/26/2018      Further, the patient also has history of Vitamin D Deficiency and supplements vitamin D without any suspected side-effects. Last vitamin D was at goal: Lab Results  Component Value Date   VD25OH 67 11/14/2017   Current Outpatient Medications on File Prior to Visit  Medication Sig  . acetaminophen (TYLENOL) 500 MG tablet Take 500-1,000 mg by mouth every 6 (six) hours as needed for headache.  Marland Kitchen apixaban (ELIQUIS) 2.5 MG TABS tablet Take 1 tablet (2.5 mg total) by mouth 2 (two) times daily.  Marland Kitchen atenolol (TENORMIN) 100 MG tablet TAKE 1 TABLET BY MOUTH DAILY FOR BLOOD PRESSURE  . Cholecalciferol (VITAMIN D) 2000 units tablet Take 4,000 Units by mouth daily.   . clonazePAM (KLONOPIN) 1 MG tablet TAKE 1/2 TO 1 TABLET BY MOUTH 2-3 TIMES DAILY ONLY IF NEEDED FOR ANXIETY ATTACK AND LIMIT TO 5 DAYS PER WEEK TO AVOID ADDICTION (Patient taking differently: Take 0.5 mg by mouth 2 (two) times daily. )  . diclofenac sodium (VOLTAREN) 1 % GEL Apply 2 g topically 4 (four) times daily.  Marland Kitchen dicyclomine (BENTYL) 10 MG capsule Take 10 mg by mouth daily.  Marland Kitchen gabapentin (NEURONTIN) 600 MG tablet Take 1/2 to 1 tablet s 3  to 4 x / day as needed for Facial Pain (Patient taking differently: Take 600 mg by mouth 2 (two) times daily with a meal. )  . hyoscyamine (LEVSIN SL) 0.125 MG SL tablet Dissolve 1 tablet under tongue every 4 hours as needed for Nausea, Cramping , bloating or Diarrhea (Patient taking differently: Place 0.125 mg under the tongue every 4 (four) hours as needed for cramping (nausea, bloating, diarrhea). )  . losartan (COZAAR) 100 MG tablet TAKE 1/2 TABLET BY  MOUTH DAILY (Patient taking differently: Take 50 mg by mouth every evening. )  . polyvinyl alcohol (ARTIFICIAL TEARS) 1.4 % ophthalmic solution Place 1 drop into both eyes daily as needed for dry eyes.  . pravastatin (PRAVACHOL) 40 MG tablet Take 0.5 tablets (20 mg total) by mouth every evening.  . ranitidine (ZANTAC) 300 MG tablet TAKE 1 TABLET BY MOUTH TWICE DAILY AS NEEDED FOR ACID INDIGESTION ANDREFLUX (Patient taking differently: Take 300 mg by mouth 2 (two) times daily as needed (acid indigestion/reflux). )  . traMADol (ULTRAM) 50 MG tablet TAKE 1 TABLET BY MOUTH 4 TIMES DAILY AS NEEDED FOR PAIN (Patient taking differently: Take 50 mg by mouth 4 (four) times daily as needed (pain). )  . [DISCONTINUED] omeprazole (PRILOSEC OTC) 20 MG tablet Take 20 mg by mouth daily.   No current facility-administered medications on file prior to visit.    Allergies  Allergen Reactions  . Latex Hives    TONGUE HAD BLISTERS WHEN HAD DENTAL SURGERY  . Amoxicillin Nausea And Vomiting  . Aspirin Other (See Comments)    Gi upset  . Barbiturates Other (See Comments)    Unknown reaction  . Celebrex [Celecoxib] Other (See Comments)    Unknown reaction  . Codeine Itching  . Evista [Raloxifene] Other (See Comments)    Unknown reaction  . Lipitor [Atorvastatin] Other (See Comments)    Unknown reaction  . Morphine And Related Other (See Comments)    Unknown reaction  . Oruvail [Ketoprofen] Other (See Comments)    Unknown reaction  . Pantoprazole Diarrhea  . Paraffin Other (See Comments)    Unknown reaction  . Prochlorperazine Other (See Comments)    Uncontrolled shaking  . Proloprim [Trimethoprim] Other (See Comments)    Unknown reaction  . Venlafaxine Nausea Only  . Vibra-Tab [Doxycycline] Other (See Comments)    Unknown reaction  . Vioxx [Rofecoxib] Other (See Comments)    edema  . Betadine [Povidone Iodine] Rash  . Caffeine Palpitations   PMHx:   Past Medical History:  Diagnosis Date  .  Anxiety   . Arthritis   . Blood transfusion   . C. difficile colitis   . Candida esophagitis ([place]) 2013   EGD  . Cataract   . Chronic kidney disease    STONES  . Diverticulitis 04/05/2017  . Diverticulosis of colon (without mention of hemorrhage) 2007   Colonoscopy  . Dysrhythmia    RX  . Emphysema of lung ([place])   . Family history of colon cancer    sister  . Fibromyalgia   . Fracture, zygoma closed ([place]) 07/13/2011  . GERD (gastroesophageal reflux disease)   . Headache(784.0)   . Hyperlipemia   . Hypertension   . Kidney stones   . Ptosis, bilateral   . Renal infarct ([place])   . Splenic infarct    Immunization History  Administered Date(s) Administered  . Influenza, High Dose Seasonal PF 03/25/2014, 04/05/2016  . Influenza-Unspecified 03/09/2013, 03/11/2015  . Pneumococcal Conjugate-13 12/21/2015  .  Pneumococcal Polysaccharide-23 06/25/2002  . Td 06/26/2003   Past Surgical History:  Procedure Laterality Date  . ABDOMINAL HYSTERECTOMY    . BACK SURGERY     X2  . BACK SURGERY    . BREAST EXCISIONAL BIOPSY    . CERVICAL DISC SURGERY    . COLONOSCOPY    . FACIAL FRACTURE SURGERY    . HAND SURGERY     BIL   . ORIF TRIPOD FRACTURE  07/13/2011   Procedure: OPEN REDUCTION INTERNAL FIXATION (ORIF) TRIPOD FRACTURE;  Surgeon: Tyson Alias, MD;  Location: [facility];  Service: ENT;  Laterality: Right;  ORIF RIGHT ZYGOMA, ORBITAL FLOOR EXPLORATION WITH FROST STITCH (TEMPORARY TARSORRHAPHY)  . PTOSIS REPAIR Bilateral 11/22/2016   Procedure: INTERNAL PTOSIS REPAIR;  Surgeon: Clista Bernhardt, MD;  Location: [facility];  Service: Ophthalmology;  Laterality: Bilateral;  . SHOULDER ARTHROSCOPY W/ ROTATOR CUFF REPAIR     LFT   FHx:    Reviewed / unchanged  SHx:    Reviewed / unchanged  Systems Review:  Constitutional: Denies fever, chills, wt changes, headaches, insomnia, fatigue, night sweats, change in appetite. Eyes: Denies redness, blurred vision, diplopia, discharge, itchy,  watery eyes.  ENT: Denies discharge, congestion, post nasal drip, epistaxis, sore throat, earache, hearing loss, dental pain, tinnitus, vertigo, sinus pain, snoring.  CV: Denies chest pain, palpitations, irregular heartbeat, syncope, dyspnea, diaphoresis, orthopnea, PND, claudication or edema. Respiratory: denies cough, dyspnea, DOE, pleurisy, hoarseness, laryngitis, wheezing.  Gastrointestinal: Denies dysphagia, odynophagia, heartburn, reflux, water brash, abdominal pain or cramps, nausea, vomiting, bloating, diarrhea, constipation, hematemesis, melena, hematochezia  or hemorrhoids. Genitourinary: Denies dysuria, frequency, urgency, nocturia, hesitancy, discharge, hematuria or flank pain. Musculoskeletal: Denies arthralgias, myalgias, stiffness, jt. swelling, pain, limping or strain/sprain.  Skin: Denies pruritus, rash, hives, warts, acne, eczema or change in skin lesion(s). Neuro: No weakness, tremor, incoordination, spasms, paresthesia or pain. Psychiatric: Denies confusion, memory loss or sensory loss. Endo: Denies change in weight, skin or hair change.  Heme/Lymph: No excessive bleeding, bruising or enlarged lymph nodes.  Physical Exam  BP 136/76   Pulse 60   Temp 97.6 F (36.4 C)   Resp 16   Ht 5' 3.5" (1.613 m)   Wt 115 lb 3.2 oz (52.3 kg)   BMI 20.09 kg/m   Appears well nourished, well groomed  and in no distress.  Eyes: PERRLA, EOMs, conjunctiva no swelling or erythema. Sinuses: No frontal/maxillary tenderness ENT/Mouth: EAC's clear, TM's nl w/o erythema, bulging. Nares clear w/o erythema, swelling, exudates. Oropharynx clear without erythema or exudates. Oral hygiene is good. Tongue normal, non obstructing. Hearing intact.  Neck: Supple. Thyroid nl. Car 2+/2+ without bruits, nodes or JVD. Chest: Respirations nl with BS clear & equal w/o rales, rhonchi, wheezing or stridor.  Cor: Heart sounds normal w/ regular rate and rhythm without sig. murmurs, gallops, clicks or rubs.  Peripheral pulses normal and equal  without edema.  Abdomen: Soft & bowel sounds normal. Non-tender w/o guarding, rebound, hernias, masses or organomegaly.  Lymphatics: Unremarkable.  Musculoskeletal: Full ROM all peripheral extremities, joint stability, 5/5 strength and normal gait.  Skin: Warm, dry without exposed rashes, lesions or ecchymosis apparent.  Neuro: Cranial nerves intact, reflexes equal bilaterally. Sensory-motor testing grossly intact. Tendon reflexes grossly intact.  Pysch: Alert & oriented x 3.  Insight and judgement nl & appropriate. No ideations.  Assessment and Plan:  1. Renal infarct ([place])  - Eliquis  2. Splenic infarct  - Eliquis  3. Essential hypertension  - Continue medication, monitor  blood pressure at home.  - Continue DASH diet. Reminder to go to the ER if any CP,  SOB, nausea, dizziness, severe HA, changes vision/speech.  - CBC with Differential/Platelet - COMPLETE METABOLIC PANEL WITH GFR  4. Pulmonary emphysema(HCC)  5. Chronic hyponatremia  - COMPLETE METABOLIC PANEL WITH GFR  6. Chronic hyperkalemia  - COMPLETE METABOLIC PANEL WITH GFR  7. Right sided numbness  8. Aortic atherosclerosis ([place])  9. Coronary atherosclerosis due to lipid rich plaque  10. Medication management  - CBC with Differential/Platelet - COMPLETE METABOLIC PANEL WITH GFR        Discussed ani-coagulant precautions w/Eliquis. Discussed  regular exercise, BP monitoring, weight control to achieve/maintain BMI less than 25 and discussed meds and SE's. Recommended labs to assess and monitor clinical status with further disposition pending results of labs. Over 30 minutes of exam, counseling, chart review was performed.

## 2018-03-26 NOTE — Patient Instructions (Signed)
Bleeding Precautions When on Anticoagulant Therapy (Eliquis)   WHAT IS ANTICOAGULANT THERAPY? Anticoagulant therapy is taking medicine to prevent or reduce blood clots. It is also called blood thinner therapy. Blood clots that form in your blood vessels can be dangerous. They can break loose and travel to your heart, lungs, or brain. This increases your risk of a heart attack or stroke. Anticoagulant therapy causes blood to clot more slowly. You may need anticoagulant therapy if you have:  A medical condition that increases the likelihood that blood clots will form.  A heart defect or a problem with heart rhythm. It is also a common treatment after heart surgery, such as valve replacement. WHAT ARE COMMON TYPES OF ANTICOAGULANT THERAPY? Anticoagulant medicine can be injected or taken by mouth.If you need anticoagulant therapy quickly at the hospital, the medicine may be injected under your skin or given through an IV tube. Heparin is a common example of an anticoagulant that you may get at the hospital. Most anticoagulant therapy is in the form of pills that you take at home every day. These may include:  Aspirin. This common blood thinner works by preventing blood cells (platelets) from sticking together to form a clot. Aspirin is not as strong as anticoagulants that slow down the time that it takes for your body to form a clot.  Clopidogrel. This is a newer type of drug that affects platelets. It is stronger than aspirin.  Warfarin. This is the most common anticoagulant. It changes the way your body uses vitamin K, a vitamin that helps your blood to clot. The risk of bleeding is higher with warfarin than with aspirin. You will need frequent blood tests to make sure you are taking the safest amount.  New anticoagulants. Several new drugs have been approved. They are all taken by mouth. Studies show that these drugs work as well as warfarin. They do not require blood testing. They may cause less  bleeding risk than warfarin. WHAT DO I NEED TO REMEMBER WHEN TAKING ANTICOAGULANT THERAPY? Anticoagulant therapy decreases your risk of forming a blood clot, but it increases your risk of bleeding. Work closely with your health care provider to make sure you are taking your medicine safely. These tips can help:  Learn ways to reduce your risk of bleeding.  If you are taking warfarin: ? Have blood tests as ordered by your health care provider. ? Do not make any sudden changes to your diet. Vitamin K in your diet can make warfarin less effective. ? Do not get pregnant. This medicine may cause birth defects.  Take your medicine at the same time every day. If you forget to take your medicine, take it as soon as you remember. If you miss a whole day, do not double your dose of medicine. Take your normal dose and call your health care provider to check in.  Do not stop taking your medicine on your own.  Tell your health care provider before you start taking any new medicine, vitamin, or herbal product. Some of these could interfere with your therapy.  Tell all of your health care providers that you are on anticoagulant therapy.  Do not have surgery, medical procedures, or dental work until you tell your health care provider that you are on anticoagulant therapy. WHAT CAN AFFECT HOW ANTICOAGULANTS WORK? Certain foods, vitamins, medicines, supplements, and herbal medicines change the way that anticoagulant therapy works. They may increase or decrease the effects of your anticoagulant therapy. Either result can be dangerous  for you.  Many over-the-counter medicines for pain, colds, or stomach problems interfere with anticoagulant therapy. Take these only as told by your health care provider.  Do not drink alcohol. It can interfere with your medicine and increase your risk of an injury that causes bleeding.  If you are taking warfarin, do not begin eating more foods that contain vitamin K. These  include leafy green vegetables. Ask your health care provider if you should avoid any foods. WHAT ARE SOME WAYS TO PREVENT BLEEDING? You can prevent bleeding by taking certain precautions:  Be extra careful when you use knives, scissors, or other sharp objects.  Use an electric razor instead of a blade.  Do not use toothpicks.  Use a soft toothbrush.  Wear shoes that have nonskid soles.  Use bath mats and handrails in your bathroom.  Wear gloves while you do yard work.  Wear a helmet when you ride a bike.  Wear your seat belt.  Prevent falls by removing loose rugs and extension cords from areas where you walk.  Do not play contact sports or participate in other activities that have a high risk of injury. [place] PROVIDER? Call your health care provider if:  You miss a dose of medicine: ? And you are not sure what to do. ? For more than one day.  You have: ? Menstrual bleeding that is heavier than normal. ? Blood in your urine. ? A bloody nose or bleeding gums. ? Easy bruising. ? Blood in your stool (feces) or have black and tarry stool. ? Side effects from your medicine.  You feel weak or dizzy.  You become pregnant. Seek immediate medical care if:  You have bleeding that will not stop.  You have sudden and severe headache or belly pain.  You vomit or you cough up bright red blood.  You have a severe blow to your head. WHAT ARE SOME QUESTIONS TO ASK MY HEALTH CARE PROVIDER?  What is the best anticoagulant therapy for my condition?  What side effects should I watch for?  When should I take my medicine? What should I do if I forget to take it?  Will I need to have regular blood tests?  Do I need to change my diet? Are there foods or drinks that I should avoid?  What activities are safe for me? ++++++++++++++++++++++++++++++++++++++ Recommend Adult Low Dose Aspirin or  coated  Aspirin 81 mg daily  To reduce risk of Colon  Cancer 20 %,  Skin Cancer 26 % ,  Melanoma 46%  and  Pancreatic cancer 60% +++++++++++++++++++++++++ Vitamin D goal  is between 70-100.  Please make sure that you are taking your Vitamin D as directed.  It is very important as a natural anti-inflammatory  helping hair, skin, and nails, as well as reducing stroke and heart attack risk.  It helps your bones and helps with mood. It also decreases numerous cancer risks so please take it as directed.  Low Vit D is associated with a 200-300% higher risk for CANCER  and 200-300% higher risk for HEART   ATTACK  &  STROKE.   .....................................Marland Kitchen It is also associated with higher death rate at younger ages,  autoimmune diseases like Rheumatoid arthritis, Lupus, Multiple Sclerosis.    Also many other serious conditions, like depression, Alzheimer's Dementia, infertility, muscle aches, fatigue, fibromyalgia - just to name a few. ++++++++++++++++++++ Recommend the book "The END of DIETING" by Dr Excell Seltzer  &  the book "The END of DIABETES " by Dr Excell Seltzer At [email or web address] - get book & Audio CD's    Being diabetic has a  300% increased risk for heart attack, stroke, cancer, and alzheimer- type vascular dementia. It is very important that you work harder with diet by avoiding all foods that are white. Avoid white rice (brown & wild rice is OK), white potatoes (sweetpotatoes in moderation is OK), White bread or wheat bread or anything made out of white flour like bagels, donuts, rolls, buns, biscuits, cakes, pastries, cookies, pizza crust, and pasta (made from white flour & egg whites) - vegetarian pasta or spinach or wheat pasta is OK. Multigrain breads like Arnold's or Pepperidge [employer], or multigrain sandwich thins or flatbreads.  Diet, exercise and weight loss can reverse and cure diabetes in the early stages.  Diet, exercise and weight loss is very important in the control and prevention of complications of diabetes which affects  every system in your body, ie. Brain - dementia/stroke, eyes - glaucoma/blindness, heart - heart attack/heart failure, kidneys - dialysis, stomach - gastric paralysis, intestines - malabsorption, nerves - severe painful neuritis, circulation - gangrene & loss of a leg(s), and finally cancer and Alzheimers.    I recommend avoid fried & greasy foods,  sweets/candy, white rice (brown or wild rice or Quinoa is OK), white potatoes (sweet potatoes are OK) - anything made from white flour - bagels, doughnuts, rolls, buns, biscuits,white and wheat breads, pizza crust and traditional pasta made of white flour & egg white(vegetarian pasta or spinach or wheat pasta is OK).  Multi-grain bread is OK - like multi-grain flat bread or sandwich thins. Avoid alcohol in excess. Exercise is also important.    Eat all the vegetables you want - avoid meat, especially red meat and dairy - especially cheese.  Cheese is the most concentrated form of trans-fats which is the worst thing to clog up our arteries. Veggie cheese is OK which can be found in the fresh produce section at [employer] or [employer] or [employer]  +++++++++++++++++++++ DASH Eating Plan  DASH stands for "Dietary Approaches to Stop Hypertension."   The DASH eating plan is a healthy eating plan that has been shown to reduce high blood pressure (hypertension). Additional health benefits may include reducing the risk of type 2 diabetes mellitus, heart disease, and stroke. The DASH eating plan may also help with weight loss. WHAT DO I NEED TO KNOW ABOUT THE DASH EATING PLAN? For the DASH eating plan, you will follow these general guidelines:  Choose foods with a percent daily value for sodium of less than 5% (as listed on the food label).  Use salt-free seasonings or herbs instead of table salt or sea salt.  Check with your health care provider or pharmacist before using salt substitutes.  Eat lower-sodium products, often labeled as "lower sodium" or  "no salt added."  Eat fresh foods.  Eat more vegetables, fruits, and low-fat dairy products.  Choose whole grains. Look for the word "whole" as the first word in the ingredient list.  Choose fish   Limit sweets, desserts, sugars, and sugary drinks.  Choose heart-healthy fats.  Eat veggie cheese   Eat more home-cooked food and less restaurant, buffet, and fast food.  Limit fried foods.  Cook foods using methods other than frying.  Limit canned vegetables. If you do use them, rinse them well to decrease the sodium.  When eating at a restaurant, ask that your food be prepared with  less salt, or no salt if possible.                      WHAT FOODS CAN I EAT? Read Dr Fara Olden Fuhrman's books on The End of Dieting & The End of Diabetes  Grains Whole grain or whole wheat bread. Brown rice. Whole grain or whole wheat pasta. Quinoa, bulgur, and whole grain cereals. Low-sodium cereals. Corn or whole wheat flour tortillas. Whole grain cornbread. Whole grain crackers. Low-sodium crackers.  Vegetables Fresh or frozen vegetables (raw, steamed, roasted, or grilled). Low-sodium or reduced-sodium tomato and vegetable juices. Low-sodium or reduced-sodium tomato sauce and paste. Low-sodium or reduced-sodium canned vegetables.   Fruits All fresh, canned (in natural juice), or frozen fruits.  Protein Products  All fish and seafood.  Dried beans, peas, or lentils. Unsalted nuts and seeds. Unsalted canned beans.  Dairy Low-fat dairy products, such as skim or 1% milk, 2% or reduced-fat cheeses, low-fat ricotta or cottage cheese, or plain low-fat yogurt. Low-sodium or reduced-sodium cheeses.  Fats and Oils Tub margarines without trans fats. Light or reduced-fat mayonnaise and salad dressings (reduced sodium). Avocado. Safflower, olive, or canola oils. Natural peanut or almond butter.  Other Unsalted popcorn and pretzels. The items listed above may not be a complete list of recommended foods or  beverages. Contact your dietitian for more options.  +++++++++++++++  WHAT FOODS ARE NOT RECOMMENDED? Grains/ White flour or wheat flour White bread. White pasta. White rice. Refined cornbread. Bagels and croissants. Crackers that contain trans fat.  Vegetables  Creamed or fried vegetables. Vegetables in a . Regular canned vegetables. Regular canned tomato sauce and paste. Regular tomato and vegetable juices.  Fruits Dried fruits. Canned fruit in light or heavy syrup. Fruit juice.  Meat and Other Protein Products Meat in general - RED meat & White meat.  Fatty cuts of meat. Ribs, chicken wings, all processed meats as bacon, sausage, bologna, salami, fatback, hot dogs, bratwurst and packaged luncheon meats.  Dairy Whole or 2% milk, cream, half-and-half, and cream cheese. Whole-fat or sweetened yogurt. Full-fat cheeses or blue cheese. Non-dairy creamers and whipped toppings. Processed cheese, cheese spreads, or cheese curds.  Condiments Onion and garlic salt, seasoned salt, table salt, and sea salt. Canned and packaged gravies. Worcestershire sauce. Tartar sauce. Barbecue sauce. Teriyaki sauce. Soy sauce, including reduced sodium. Steak sauce. Fish sauce. Oyster sauce. Cocktail sauce. Horseradish. Ketchup and mustard. Meat flavorings and tenderizers. Bouillon cubes. Hot sauce. Tabasco sauce. Marinades. Taco seasonings. Relishes.  Fats and Oils Butter, stick margarine, lard, shortening and bacon fat. Coconut, palm kernel, or palm oils. Regular salad dressings.  Pickles and olives. Salted popcorn and pretzels.  The items listed above may not be a complete list of foods and beverages to avoid.

## 2018-03-27 ENCOUNTER — Ambulatory Visit (INDEPENDENT_AMBULATORY_CARE_PROVIDER_SITE_OTHER): Payer: PPO | Admitting: Internal Medicine

## 2018-03-27 VITALS — BP 136/76 | HR 60 | Temp 97.6°F | Resp 16 | Ht 63.5 in | Wt 115.2 lb

## 2018-03-27 DIAGNOSIS — E871 Hypo-osmolality and hyponatremia: Secondary | ICD-10-CM

## 2018-03-27 DIAGNOSIS — I700 Atherosclerosis of aorta: Secondary | ICD-10-CM

## 2018-03-27 DIAGNOSIS — I10 Essential (primary) hypertension: Secondary | ICD-10-CM | POA: Diagnosis not present

## 2018-03-27 DIAGNOSIS — N280 Ischemia and infarction of kidney: Secondary | ICD-10-CM | POA: Diagnosis not present

## 2018-03-27 DIAGNOSIS — I2510 Atherosclerotic heart disease of native coronary artery without angina pectoris: Secondary | ICD-10-CM

## 2018-03-27 DIAGNOSIS — Z79899 Other long term (current) drug therapy: Secondary | ICD-10-CM | POA: Diagnosis not present

## 2018-03-27 DIAGNOSIS — R200 Anesthesia of skin: Secondary | ICD-10-CM

## 2018-03-27 DIAGNOSIS — E875 Hyperkalemia: Secondary | ICD-10-CM

## 2018-03-27 DIAGNOSIS — I2583 Coronary atherosclerosis due to lipid rich plaque: Secondary | ICD-10-CM

## 2018-03-27 DIAGNOSIS — D735 Infarction of spleen: Secondary | ICD-10-CM

## 2018-03-27 DIAGNOSIS — J439 Emphysema, unspecified: Secondary | ICD-10-CM

## 2018-03-28 ENCOUNTER — Other Ambulatory Visit: Payer: Self-pay | Admitting: *Deleted

## 2018-03-28 LAB — CBC WITH DIFFERENTIAL/PLATELET
BASOS ABS: 37 {cells}/uL (ref 0–200)
Basophils Relative: 0.5 %
EOS ABS: 110 {cells}/uL (ref 15–500)
Eosinophils Relative: 1.5 %
HEMATOCRIT: 35.3 % (ref 35.0–45.0)
HEMOGLOBIN: 12.1 g/dL (ref 11.7–15.5)
LYMPHS ABS: 1489 {cells}/uL (ref 850–3900)
MCH: 31.6 pg (ref 27.0–33.0)
MCHC: 34.3 g/dL (ref 32.0–36.0)
MCV: 92.2 fL (ref 80.0–100.0)
MPV: 10.7 fL (ref 7.5–12.5)
Monocytes Relative: 12.4 %
NEUTROS ABS: 4760 {cells}/uL (ref 1500–7800)
Neutrophils Relative %: 65.2 %
Platelets: 229 10*3/uL (ref 140–400)
RBC: 3.83 10*6/uL (ref 3.80–5.10)
RDW: 13.1 % (ref 11.0–15.0)
Total Lymphocyte: 20.4 %
WBC mixed population: 905 cells/uL (ref 200–950)
WBC: 7.3 10*3/uL (ref 3.8–10.8)

## 2018-03-28 LAB — COMPLETE METABOLIC PANEL WITH GFR
AG Ratio: 1.7 (calc) (ref 1.0–2.5)
ALT: 22 U/L (ref 6–29)
AST: 31 U/L (ref 10–35)
Albumin: 4.5 g/dL (ref 3.6–5.1)
Alkaline phosphatase (APISO): 66 U/L (ref 33–130)
BUN: 10 mg/dL (ref 7–25)
CALCIUM: 9.7 mg/dL (ref 8.6–10.4)
CO2: 30 mmol/L (ref 20–32)
CREATININE: 0.76 mg/dL (ref 0.60–0.88)
Chloride: 94 mmol/L — ABNORMAL LOW (ref 98–110)
GFR, EST NON AFRICAN AMERICAN: 73 mL/min/{1.73_m2} (ref >=60)
GFR, Est African American: 85 mL/min/{1.73_m2} (ref >=60)
GLUCOSE: 88 mg/dL (ref 65–99)
Globulin: 2.6 g/dL (calc) (ref 1.9–3.7)
POTASSIUM: 4.1 mmol/L (ref 3.5–5.3)
Sodium: 134 mmol/L — ABNORMAL LOW (ref 135–146)
Total Bilirubin: 0.5 mg/dL (ref 0.2–1.2)
Total Protein: 7.1 g/dL (ref 6.1–8.1)

## 2018-03-28 MED ORDER — FAMOTIDINE 40 MG PO TABS: 40.0000 mg | ORAL_TABLET | Freq: Every day | ORAL | 1 refills | Status: DC

## 2018-04-02 ENCOUNTER — Emergency Department (HOSPITAL_COMMUNITY)
Admission: EM | Admit: 2018-04-02 | Discharge: 2018-04-02 | Disposition: A | Discharge status: Home / Self Care | Payer: PPO | Attending: Emergency Medicine | Admitting: Emergency Medicine

## 2018-04-02 ENCOUNTER — Encounter (HOSPITAL_COMMUNITY): Payer: Self-pay | Admitting: *Deleted

## 2018-04-02 ENCOUNTER — Emergency Department (HOSPITAL_COMMUNITY): Payer: PPO

## 2018-04-02 DIAGNOSIS — Z7901 Long term (current) use of anticoagulants: Secondary | ICD-10-CM | POA: Insufficient documentation

## 2018-04-02 DIAGNOSIS — R1084 Generalized abdominal pain: Secondary | ICD-10-CM | POA: Diagnosis not present

## 2018-04-02 DIAGNOSIS — Z91040 Latex allergy status: Secondary | ICD-10-CM | POA: Insufficient documentation

## 2018-04-02 DIAGNOSIS — I2510 Atherosclerotic heart disease of native coronary artery without angina pectoris: Secondary | ICD-10-CM | POA: Diagnosis not present

## 2018-04-02 DIAGNOSIS — I2583 Coronary atherosclerosis due to lipid rich plaque: Secondary | ICD-10-CM | POA: Insufficient documentation

## 2018-04-02 DIAGNOSIS — J449 Chronic obstructive pulmonary disease, unspecified: Secondary | ICD-10-CM | POA: Diagnosis not present

## 2018-04-02 DIAGNOSIS — I700 Atherosclerosis of aorta: Secondary | ICD-10-CM | POA: Diagnosis not present

## 2018-04-02 DIAGNOSIS — I10 Essential (primary) hypertension: Secondary | ICD-10-CM | POA: Insufficient documentation

## 2018-04-02 DIAGNOSIS — R112 Nausea with vomiting, unspecified: Secondary | ICD-10-CM | POA: Diagnosis not present

## 2018-04-02 DIAGNOSIS — Z79899 Other long term (current) drug therapy: Secondary | ICD-10-CM | POA: Insufficient documentation

## 2018-04-02 LAB — URINALYSIS, ROUTINE W REFLEX MICROSCOPIC
BILIRUBIN URINE: NEGATIVE
GLUCOSE, UA: NEGATIVE mg/dL
KETONES UR: NEGATIVE mg/dL
NITRITE: NEGATIVE
PROTEIN: NEGATIVE mg/dL
Specific Gravity, Urine: 1.008 (ref 1.005–1.030)
pH: 6 (ref 5.0–8.0)

## 2018-04-02 LAB — COMPREHENSIVE METABOLIC PANEL
ALK PHOS: 58 U/L (ref 38–126)
ALT: 16 U/L (ref 0–44)
AST: 20 U/L (ref 15–41)
Albumin: 4.1 g/dL (ref 3.5–5.0)
Anion gap: 7 (ref 5–15)
BILIRUBIN TOTAL: 0.8 mg/dL (ref 0.3–1.2)
BUN: 12 mg/dL (ref 8–23)
CO2: 28 mmol/L (ref 22–32)
CREATININE: 0.84 mg/dL (ref 0.44–1.00)
Calcium: 9 mg/dL (ref 8.9–10.3)
Chloride: 95 mmol/L — ABNORMAL LOW (ref 98–111)
GFR calc Af Amer: >60 mL/min (ref >60)
Glucose, Bld: 154 mg/dL — ABNORMAL HIGH (ref 70–99)
Potassium: 3.9 mmol/L (ref 3.5–5.1)
Sodium: 130 mmol/L — ABNORMAL LOW (ref 135–145)
TOTAL PROTEIN: 7.1 g/dL (ref 6.5–8.1)

## 2018-04-02 LAB — CBC
HCT: 37.7 % (ref 36.0–46.0)
HEMOGLOBIN: 12.2 g/dL (ref 12.0–15.0)
MCH: 31.2 pg (ref 26.0–34.0)
MCHC: 32.4 g/dL (ref 30.0–36.0)
MCV: 96.4 fL (ref 80.0–100.0)
Platelets: 254 10*3/uL (ref 150–400)
RBC: 3.91 MIL/uL (ref 3.87–5.11)
RDW: 12.6 % (ref 11.5–15.5)
WBC: 8.7 10*3/uL (ref 4.0–10.5)
nRBC: 0 % (ref 0.0–0.2)

## 2018-04-02 LAB — LIPASE, BLOOD: Lipase: 51 U/L (ref 11–51)

## 2018-04-02 LAB — I-STAT CG4 LACTIC ACID, ED: Lactic Acid, Venous: 1.94 mmol/L — ABNORMAL HIGH (ref 0.5–1.9)

## 2018-04-02 MED ORDER — ONDANSETRON 4 MG PO TBDP
4.0000 mg | ORAL_TABLET | Freq: Once | ORAL | Status: AC | PRN
  Administered 2018-04-02: 4 mg via ORAL

## 2018-04-02 MED ORDER — IOPAMIDOL (ISOVUE-370) INJECTION 76%
INTRAVENOUS | Status: AC
  Filled 2018-04-02: qty 100

## 2018-04-02 MED ORDER — LORAZEPAM 2 MG/ML IJ SOLN
1.0000 mg | Freq: Once | INTRAMUSCULAR | Status: AC
  Administered 2018-04-02: 1 mg via INTRAVENOUS
  Filled 2018-04-02: qty 1

## 2018-04-02 MED ORDER — IOPAMIDOL (ISOVUE-370) INJECTION 76%
100.0000 mL | Freq: Once | INTRAVENOUS | Status: AC | PRN
  Administered 2018-04-02: 100 mL via INTRAVENOUS

## 2018-04-02 MED ORDER — FENTANYL CITRATE (PF) 100 MCG/2ML IJ SOLN
100.0000 ug | Freq: Once | INTRAMUSCULAR | Status: AC
  Administered 2018-04-02: 100 ug via INTRAVENOUS
  Filled 2018-04-02: qty 2

## 2018-04-02 NOTE — ED Provider Notes (Signed)
[facility] EMERGENCY DEPARTMENT Provider Note   CSN: [id number] Arrival date & time: 04/02/18  1249     History   Chief Complaint Chief Complaint  Patient presents with  . Emesis  . Abdominal Pain    HPI Debra White is a 84 y.o. female past medical history of anxiety, diverticulitis, dysrhythmia, fibromyalgia, GERD who presents for evaluation of generalized abdominal pain that worsened this morning.  She reports she has had some generalized abdominal pain that is been intermittent since the end of September when she was discharged from the hospital.  She states that this morning, the pain worsened.  She states that it just hurts all over and describes it as a "constant aching type pain."  She had a few episodes of vomiting earlier today.  Emesis was nonbloody, nonbilious.  She does not have any diarrhea.  She has not noticed any blood in her stool.  She reports she has been able to eat and drink without any difficulty.  She states that pain is not exacerbated by eating or drinking.  She states she has not had any fevers.  Patient reports there is no aggravating factors.  She was recently admitted to the hospital on 03/22/18.  At that time, she had some lower abdominal pain that radiated to the flank.  Her CT scan showed a questionable subacute renal and splenic infarct.  During admission, she was evaluated for possible runs of A. fib that she had been complaining of some irregular heartbeats.  She was started on Eliquis which she states she has been compliant with since her discharge.  She is scheduled for an appointment with the cardiologist for an event monitor to monitor dysrhythmia.  Patient denies any chest pain, difficulty breathing, urinary complaints.  The history is provided by the patient.    Past Medical History:  Diagnosis Date  . Anxiety   . Arthritis   . Blood transfusion   . C. difficile colitis   . Candida esophagitis ([place]) 2013   EGD  . Cataract     . Chronic kidney disease    STONES  . Diverticulitis 04/05/2017  . Diverticulosis of colon (without mention of hemorrhage) 2007   Colonoscopy  . Dysrhythmia    RX  . Emphysema of lung ([place])   . Family history of colon cancer    sister  . Fibromyalgia   . Fracture, zygoma closed ([place]) 07/13/2011  . GERD (gastroesophageal reflux disease)   . Headache(784.0)   . Hyperlipemia   . Hypertension   . Kidney stones   . Ptosis, bilateral   . Renal infarct ([place])   . Splenic infarct     Patient Active Problem List   Diagnosis Date Noted  . Coronary atherosclerosis due to lipid rich plaque 03/26/2018  . Splenic infarct 03/22/2018  . Renal infarct ([place]) 03/22/2018  . Chronic hyponatremia 03/22/2018  . Chronic hyperkalemia 03/22/2018  . Right sided numbness 03/22/2018  . Hepatic steatosis 11/17/2017  . Aortic atherosclerosis ([place]) 08/08/2017  . COPD (chronic obstructive pulmonary disease) with emphysema ([place]) 12/21/2015  . 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 Osteoporosis 12/21/2015  . Mild malnutrition ([place]) 05/11/2015  . Encounter for Medicare annual wellness exam 04/08/2015  . Medication management 01/27/2014  . 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 Vitamin D deficiency 07/08/2013  . Hypertension   . Hyperlipidemia   . GERD   . Headache, post-traumatic, chronic 09/20/2011    Past Surgical History:  Procedure Laterality Date  .  ABDOMINAL HYSTERECTOMY    . BACK SURGERY     X2  . BACK SURGERY    . BREAST EXCISIONAL BIOPSY    . CERVICAL DISC SURGERY    . COLONOSCOPY    . FACIAL FRACTURE SURGERY    . HAND SURGERY     BIL   . ORIF TRIPOD FRACTURE  07/13/2011   Procedure: OPEN REDUCTION INTERNAL FIXATION (ORIF) TRIPOD FRACTURE;  Surgeon: Tyson Alias, MD;  Location: [facility];  Service: ENT;  Laterality: Right;  ORIF RIGHT ZYGOMA, ORBITAL FLOOR EXPLORATION WITH FROST STITCH (TEMPORARY TARSORRHAPHY)  . PTOSIS REPAIR Bilateral 11/22/2016   Procedure: INTERNAL PTOSIS REPAIR;  Surgeon:  Clista Bernhardt, MD;  Location: [facility];  Service: Ophthalmology;  Laterality: Bilateral;  . SHOULDER ARTHROSCOPY W/ ROTATOR CUFF REPAIR     LFT     OB History   None      Home Medications    Prior to Admission medications   Medication Sig Start Date End Date Taking? Authorizing Provider  acetaminophen (TYLENOL) 500 MG tablet Take 500-1,000 mg by mouth every 6 (six) hours as needed for headache.    [provider]  apixaban (ELIQUIS) 2.5 MG TABS tablet Take 1 tablet (2.5 mg total) by mouth 2 (two) times daily. 03/24/18   Geradine Girt, DO  atenolol (TENORMIN) 100 MG tablet TAKE 1 TABLET BY MOUTH DAILY FOR BLOOD PRESSURE 03/26/18   Vicie Mutters, PA-C  Cholecalciferol (VITAMIN D) 2000 units tablet Take 4,000 Units by mouth daily.     [provider]  clonazePAM (KLONOPIN) 1 MG tablet TAKE 1/2 TO 1 TABLET BY MOUTH 2-3 TIMES DAILY ONLY IF NEEDED FOR ANXIETY ATTACK AND LIMIT TO 5 DAYS PER WEEK TO AVOID ADDICTION Patient taking differently: Take 0.5 mg by mouth 2 (two) times daily.  03/19/18   Unk Pinto, MD  diclofenac sodium (VOLTAREN) 1 % GEL Apply 2 g topically 4 (four) times daily. 03/24/18   Geradine Girt, DO  dicyclomine (BENTYL) 10 MG capsule Take 10 mg by mouth daily. 03/10/18   [provider]  famotidine (PEPCID) 40 MG tablet Take 1 tablet (40 mg total) by mouth daily. 03/28/18   Unk Pinto, MD  gabapentin (NEURONTIN) 600 MG tablet Take 1/2 to 1 tablet s 3 to 4 x / day as needed for Facial Pain Patient taking differently: Take 600 mg by mouth 2 (two) times daily with a meal.  11/14/17 11/15/18  Unk Pinto, MD  hyoscyamine (LEVSIN SL) 0.125 MG SL tablet Dissolve 1 tablet under tongue every 4 hours as needed for Nausea, Cramping , bloating or Diarrhea Patient taking differently: Place 0.125 mg under the tongue every 4 (four) hours as needed for cramping (nausea, bloating, diarrhea).  03/07/18   Unk Pinto, MD  losartan (COZAAR) 100 MG  tablet TAKE 1/2 TABLET BY MOUTH DAILY Patient taking differently: Take 50 mg by mouth every evening.  06/07/17   Unk Pinto, MD  polyvinyl alcohol (ARTIFICIAL TEARS) 1.4 % ophthalmic solution Place 1 drop into both eyes daily as needed for dry eyes.    [provider]  pravastatin (PRAVACHOL) 40 MG tablet Take 0.5 tablets (20 mg total) by mouth every evening. 03/24/18   Geradine Girt, DO  traMADol (ULTRAM) 50 MG tablet TAKE 1 TABLET BY MOUTH 4 TIMES DAILY AS NEEDED FOR PAIN Patient taking differently: Take 50 mg by mouth 4 (four) times daily as needed (pain).  08/12/17   Vicie Mutters, PA-C  omeprazole ([place]  OTC) 20 MG tablet Take 20 mg by mouth daily.  09/01/11  [provider]    Family History Family History  Problem Relation Age of Onset  . Heart disease Mother   . Heart disease Father   . Breast cancer Sister 42  . Colon cancer Sister   . Cancer Sister        breast  . Cancer Sister        colon  . Cancer Sister        colon  . Hypertension Daughter   . Hyperlipidemia Daughter     Social History Social History   Tobacco Use  . Smoking status: Never Smoker  . Smokeless tobacco: Never Used  Substance Use Topics  . Alcohol use: No  . Drug use: No     Allergies   Latex; Amoxicillin; Aspirin; Barbiturates; Celebrex [celecoxib]; Codeine; Evista [raloxifene]; Lipitor [atorvastatin]; Morphine and related; Oruvail [ketoprofen]; Pantoprazole; Paraffin; Prochlorperazine; Proloprim [trimethoprim]; Venlafaxine; Vibra-tab [doxycycline]; Vioxx [rofecoxib]; Betadine [povidone iodine]; and Caffeine   Review of Systems Review of Systems  Constitutional: Negative for fever.  Respiratory: Negative for cough and shortness of breath.   Cardiovascular: Negative for chest pain.  Gastrointestinal: Positive for abdominal pain and vomiting. Negative for diarrhea and nausea.  Genitourinary: Negative for dysuria and hematuria.  Neurological: Negative for  headaches.  All other systems reviewed and are negative.    Physical Exam Updated Vital Signs BP 134/63 (BP Location: Left Arm)   Pulse (!) 57   Temp 97.6 F (36.4 C) (Oral)   Resp 15   SpO2 94%   Physical Exam  Constitutional: She is oriented to person, place, and time. She appears well-developed and well-nourished.  Sitting comfortably on examination table  HENT:  Head: Normocephalic and atraumatic.  Mouth/Throat: Oropharynx is clear and moist and mucous membranes are normal.  Eyes: Pupils are equal, round, and reactive to light. Conjunctivae, EOM and lids are normal.  Neck: Full passive range of motion without pain.  Cardiovascular: Normal rate, regular rhythm, normal heart sounds and normal pulses. Exam reveals no gallop and no friction rub.  No murmur heard. Pulses:      Radial pulses are 2+ on the right side, and 2+ on the left side.       Dorsalis pedis pulses are 2+ on the right side, and 2+ on the left side.  Pulmonary/Chest: Effort normal and breath sounds normal.  Abdominal: Soft. Normal appearance and bowel sounds are normal. There is generalized tenderness and tenderness in the left upper quadrant. There is no rigidity, no guarding, no CVA tenderness, no tenderness at McBurney's point and negative Murphy's sign.  Abdomen is soft, nondistended.  Generalized tenderness that is just slightly worse in the left upper quadrant.  No rigidity, guarding.  No tenderness noted at McBurney's point.  No CVA tenderness bilaterally.  Musculoskeletal: Normal range of motion.  Neurological: She is alert and oriented to person, place, and time.  Skin: Skin is warm and dry. Capillary refill takes less than 2 seconds.  Psychiatric: She has a normal mood and affect. Her speech is normal.  Nursing note and vitals reviewed.    ED Treatments / Results  Labs (all labs ordered are listed, but only abnormal results are displayed) Labs Reviewed  COMPREHENSIVE METABOLIC PANEL - Abnormal;  Notable for the following components:      Result Value   Sodium 130 (*)    Chloride 95 (*)    Glucose, Bld 154 (*)  All other components within normal limits  URINALYSIS, ROUTINE W REFLEX MICROSCOPIC - Abnormal; Notable for the following components:   Color, Urine STRAW (*)    Hgb urine dipstick SMALL (*)    Leukocytes, UA TRACE (*)    Bacteria, UA RARE (*)    All other components within normal limits  I-STAT CG4 LACTIC ACID, ED - Abnormal; Notable for the following components:   Lactic Acid, Venous 1.94 (*)    All other components within normal limits  URINE CULTURE  LIPASE, BLOOD  CBC    EKG EKG Interpretation  Date/Time:  Wednesday April 02 2018 12:57:13 EDT Ventricular Rate:  63 PR Interval:  164 QRS Duration: 76 QT Interval:  422 QTC Calculation: 431 R Axis:   24 Text Interpretation:  Normal sinus rhythm Cannot rule out Anterior infarct , age undetermined Abnormal ECG Confirmed by Lennice Sites [phone]) on 04/02/2018 6:50:58 PM   Radiology Ct Angio Abd/pel W And/or Wo Contrast  Result Date: 04/02/2018 CLINICAL DATA:  Continue nausea, vomiting and abdominal pain. Patient actively vomiting and triage. EXAM: CTA ABDOMEN AND PELVIS WITHOUT AND WITH CONTRAST TECHNIQUE: Multidetector CT imaging of the abdomen and pelvis was performed using the standard protocol during bolus administration of intravenous contrast. Multiplanar reconstructed images and MIPs were obtained and reviewed to evaluate the vascular anatomy. CONTRAST:  126mL ISOVUE-370 IOPAMIDOL (ISOVUE-370) INJECTION 76% COMPARISON:  03/22/2018 FINDINGS: VASCULAR Aorta: Mild ectasia of the ascending included thoracic aorta to 3.5 cm. Mild atherosclerosis of the abdominal aorta without aneurysmal dilatation dissection. Celiac: Patent without evidence of aneurysm, dissection, vasculitis or significant stenosis. SMA: Patent without evidence of aneurysm, dissection, vasculitis or significant stenosis. Renals: Both renal  arteries are patent without evidence of aneurysm, dissection, vasculitis, fibromuscular dysplasia or significant stenosis. IMA: Patent Inflow: Patent without evidence of aneurysm, dissection, vasculitis or significant stenosis. Atherosclerosis of both common iliac arteries without significant stenosis or aneurysm. Proximal Outflow: Mild atherosclerosis of the left common femoral artery without significant stenosis. Veins: Intraluminal thrombus of the IVC. Patent portal and splenic veins. Review of the MIP images confirms the above findings. NON-VASCULAR Lower chest: The main pulmonary artery is nondilated measuring up to 2.4 cm. No large central pulmonary embolus of the included pulmonary arterial system. Heart size is enlarged without pericardial effusion or thickening. Bibasilar dependent atelectasis is noted. Hepatobiliary: No focal liver abnormality is seen. No gallstones, gallbladder wall thickening, or biliary dilatation. Pancreas: Unremarkable. No pancreatic ductal dilatation or surrounding inflammatory changes. Spleen: Previously noted splenic infarcts are no longer apparent. No mass or enhancing lesions. No new area of infarction. Adrenals/Urinary Tract: Normal bilateral adrenal glands. Small cortical cysts of both kidneys with areas of cortical scarring in the lower poles. No obstructive uropathy. Normal urinary bladder. Stomach/Bowel: The stomach is decompressed in appearance and there is no gastric fold thickening or mural thickening noted. The duodenum, ligament of Treitz in small bowel are nonacute. Moderate stool retention within the colon is identified with extensive circular muscle hypertrophy and sigmoid diverticulosis along the sigmoid colon. Minimal distal descending colonic diverticulosis is also noted. Appendix is not discretely identified. Lymphatic: No pathologically enlarged lymph nodes. Reproductive: Status post hysterectomy. No adnexal masses. Other: No pneumoperitoneum.  No abdominopelvic  ascites. Musculoskeletal: Ankylosis of L4-5 cerclage wire overlying the posterior lower spinal elements. Anterolisthesis of L4 on L5 is noted. Mild Schmorl's node at L2. No aggressive osseous lesions. IMPRESSION: VASCULAR 1. Ectasia of the included ascending thoracic aorta. 2. Nonaneurysmal atherosclerotic abdominal aorta with patent branch vessels. No  significant stenosis or occlusion identified. NON-VASCULAR 1. Apparent resolution of previously noted splenic infarcts. No new infarct identified. 2. No bowel obstruction or inflammation. Extensive sigmoid diverticulosis without acute diverticulitis. Electronically Signed   By: Ashley Royalty M.D.   On: 04/02/2018 20:11    Procedures Procedures (including critical care time)  Medications Ordered in ED Medications  ondansetron (ZOFRAN-ODT) disintegrating tablet 4 mg (4 mg Oral Given 04/02/18 1302)  fentaNYL (SUBLIMAZE) injection 100 mcg (100 mcg Intravenous Given 04/02/18 1753)  LORazepam (ATIVAN) injection 1 mg (1 mg Intravenous Given 04/02/18 1901)  iopamidol (ISOVUE-370) 76 % injection 100 mL (100 mLs Intravenous Contrast Given 04/02/18 1911)     Initial Impression / Assessment and Plan / ED Course  I have reviewed the triage vital signs and the nursing notes.  Pertinent labs & imaging results that were available during my care of the patient were reviewed by me and considered in my medical decision making (see chart for details).     84 year old female with past mental history anxiety, GERD, hypertension who presents for evaluation of generalized abdominal pain that began today.  Recently seen in the hospital for abdominal pain was found to have a subacute splenic and renal infarct.  Was discharged home on Eliquis which she states she has been compliant.  Question if she was having runs of A. fib that would have caused her infarcts.  She is scheduled to see cardiology to get an event monitor sometime next week.  Came today because pain worsened  today.  Associate with some episodes of vomiting.  No diarrhea. Patient is afebrile, non-toxic appearing, sitting comfortably on examination table. Vital signs reviewed and stable.  Initial labs ordered at triage.  Consider infectious etiology versus worsening infarct.  Low suspicion for mesenteric ischemia.  Patient does not appear to be in A. fib here in the ED. Additionally, she does not have pain out of proportion of physical exam.  We will check lactic acid given questionable A. fib history.  CMP shows sodium 130, bicarb 95.  Otherwise unremarkable.  Lipase is unremarkable.  CBC shows no evidence of leukocytosis or anemia.  UA shows small amount of hemoglobin, trace leukocytes. Lactic acid is 1.94.  Likely dehydration rather than infectious process.   CTA abdomen pelvis reviewed.  Splenic infarcts are noted to be improved.  No new infarct identified.  No evidence of obstruction or inflammation.  She has diverticulitis without signs of acute diverticulitis.  No other acute abnormality seen.  Patient is hemodynamically stable.  We will plan for discharge with plan to follow-up with primary care doctor. Repeat abdominal exam is benign. Patient with improved tenderness. Sister at beside concerned that patient had some intermittent headache over the last week. Patient currently denying any headache. No neuro deficits noted on exam. At this time, patient exhibits no emergent life-threatening condition that require further evaluation in ED. Patient had ample opportunity for questions and discussion. All patient's questions were answered with full understanding. Strict return precautions discussed. Patient expresses understanding and agreement to plan.   Final Clinical Impressions(s) / ED Diagnoses   Final diagnoses:  Generalized abdominal pain    ED Discharge Orders    None       Volanda Napoleon, PA-C 04/03/18 0318    Lennice Sites, DO 04/05/18 [phone]

## 2018-04-02 NOTE — ED Provider Notes (Signed)
Medical screening examination/treatment/procedure(s) were conducted as a shared visit with non-physician practitioner(s) and myself.  I personally evaluated the patient during the encounter. Briefly, the patient is a 84 y.o. female with history of diverticulosis, kidney stones, hepatic steatosis, recent diagnosis of splenic infarcts and renal infarcts who presents to the ED with abdominal pain.  Patient with normal vitals.  No fever.  Patient with pain that occurred while eating today.  Has now resolved.  Has no tenderness on exam.  Did recently have splenic and renal infarcts and started on anticoagulation recently.  Patient overall with unremarkable lab work.  Lactic acid mildly elevated.  But otherwise no signs of urinary tract infection.  No significant leukocytosis, anemia, electrolyte abnormality.  Patient had repeat CTA of the abdomen and pelvis that did not show any new infarcts.  There does appear to be improvement in resolution of previously noted splenic infarcts.  Otherwise no bowel obstruction or acute diverticulitis. No acute abdominal process. Suspect likely patient with chronic process. EKG was unremarkable.  No concern for cardiac process.  Given reassurance and discharged from ED in good condition.  Recommend follow-up with primary care doctor.  This chart was dictated using voice recognition software.  Despite best efforts to proofread,  errors can occur which can change the documentation meaning.    EKG Interpretation  Date/Time:  Wednesday April 02 2018 12:57:13 EDT Ventricular Rate:  63 PR Interval:  164 QRS Duration: 76 QT Interval:  422 QTC Calculation: 431 R Axis:   24 Text Interpretation:  Normal sinus rhythm Cannot rule out Anterior infarct , age undetermined Abnormal ECG Confirmed by Lennice Sites [phone]) on 04/02/2018 6:50:58 PM           Lennice Sites, DO 04/03/18 [id number]

## 2018-04-02 NOTE — Discharge Instructions (Signed)
Take your Eliquis when you get home.  Follow-up with your primary care doctor in 3 to 4 days for further evaluation.  Return to the Emergency Department immediately if you experience any worsening abdominal pain, fever, persistent nausea and vomiting, inability keep any food down, pain with urination, blood in your urine or any other worsening or concerning symptoms.

## 2018-04-02 NOTE — ED Triage Notes (Signed)
Pt in c/o continued n/v and abdominal pain, recently admitted to hospital and tx for blood clot but states her stomach has not been right since admission, actively vomiting in triage

## 2018-04-02 NOTE — ED Notes (Signed)
Patient transported to CT 

## 2018-04-03 ENCOUNTER — Ambulatory Visit (INDEPENDENT_AMBULATORY_CARE_PROVIDER_SITE_OTHER): Payer: PPO

## 2018-04-03 DIAGNOSIS — R002 Palpitations: Secondary | ICD-10-CM | POA: Diagnosis not present

## 2018-04-03 DIAGNOSIS — N280 Ischemia and infarction of kidney: Secondary | ICD-10-CM | POA: Diagnosis not present

## 2018-04-03 DIAGNOSIS — D735 Infarction of spleen: Secondary | ICD-10-CM

## 2018-04-03 DIAGNOSIS — I480 Paroxysmal atrial fibrillation: Secondary | ICD-10-CM

## 2018-04-03 DIAGNOSIS — R000 Tachycardia, unspecified: Secondary | ICD-10-CM | POA: Diagnosis not present

## 2018-04-04 ENCOUNTER — Other Ambulatory Visit: Payer: Self-pay | Admitting: Internal Medicine

## 2018-04-04 ENCOUNTER — Ambulatory Visit (INDEPENDENT_AMBULATORY_CARE_PROVIDER_SITE_OTHER): Payer: PPO | Admitting: *Deleted

## 2018-04-04 DIAGNOSIS — R300 Dysuria: Secondary | ICD-10-CM

## 2018-04-04 DIAGNOSIS — N3289 Other specified disorders of bladder: Secondary | ICD-10-CM

## 2018-04-04 DIAGNOSIS — N390 Urinary tract infection, site not specified: Secondary | ICD-10-CM | POA: Diagnosis not present

## 2018-04-04 LAB — URINE CULTURE: Special Requests: NORMAL

## 2018-04-04 MED ORDER — FLUCONAZOLE 100 MG PO TABS: ORAL_TABLET | ORAL | 0 refills | Status: DC

## 2018-04-04 MED ORDER — CIPROFLOXACIN HCL 250 MG PO TABS: 250.0000 mg | ORAL_TABLET | Freq: Two times a day (BID) | ORAL | 0 refills | Status: DC

## 2018-04-04 NOTE — Addendum Note (Signed)
Addended by: Dolores Hoose on: 04/04/2018 10:21 AM   Modules accepted: Orders

## 2018-04-04 NOTE — Progress Notes (Signed)
Patient is here for a recheck of a UA and C/S, that was done in the ED on 04/03/2018.  The ED specimen showed contamination and recheck was advised.  The patient complained of low abdominal and low back pain. Dr [name] sent in an RX for Cipro 250 mg and Diflucan 150 mg.

## 2018-04-06 LAB — URINALYSIS, ROUTINE W REFLEX MICROSCOPIC
Bilirubin Urine: NEGATIVE
Glucose, UA: NEGATIVE
Hgb urine dipstick: NEGATIVE
KETONES UR: NEGATIVE
Leukocytes, UA: NEGATIVE
NITRITE: NEGATIVE
Protein, ur: NEGATIVE
SPECIFIC GRAVITY, URINE: 1.01 (ref 1.001–1.03)
pH: 6.5 (ref 5.0–8.0)

## 2018-04-06 LAB — URINE CULTURE
MICRO NUMBER:: 91228743
SPECIMEN QUALITY: ADEQUATE

## 2018-04-07 NOTE — Progress Notes (Deleted)
Assessment and Plan:    HPI 84 y.o.female with history of has Headache, post-traumatic, chronic; Hypertension; Hyperlipidemia; GERD; Vitamin D deficiency; Atypical facial pain; Medication management; Encounter for Medicare annual wellness exam; Mild malnutrition ([place]); COPD (chronic obstructive pulmonary disease) with emphysema ([place]); Tortuous aorta ([place]); Osteoporosis; Aortic atherosclerosis ([place]); Hepatic steatosis; Splenic infarct; Renal infarct ([place]); Chronic hyponatremia; Chronic hyperkalemia; Right sided numbness; and Coronary atherosclerosis due to lipid rich plaque on their problem list. presents for follow up from the ER. Patient went to the ER on 04/02/18, for: AB PAIN.   She was recently in the ER for AB pain and found to have splenic infarcts on CT on 03/22/2018. She has been on elliquis for possible Afib and is following up with cardiology. She went back to the ER on 04/02/2018 for AB pain, CT AB was negative, urine was negative for infection. She presents today for follow up.   Ct Angio Abd/pel W And/or Wo Contrast  Result Date: 04/02/2018 CLINICAL DATA:  Continue nausea, vomiting and abdominal pain. Patient actively vomiting and triage. EXAM: CTA ABDOMEN AND PELVIS WITHOUT AND WITH CONTRAST TECHNIQUE: Multidetector CT imaging of the abdomen and pelvis was performed using the standard protocol during bolus administration of intravenous contrast. Multiplanar reconstructed images and MIPs were obtained and reviewed to evaluate the vascular anatomy. CONTRAST:  187mL ISOVUE-370 IOPAMIDOL (ISOVUE-370) INJECTION 76% COMPARISON:  03/22/2018 FINDINGS: VASCULAR Aorta: Mild ectasia of the ascending included thoracic aorta to 3.5 cm. Mild atherosclerosis of the abdominal aorta without aneurysmal dilatation dissection. Celiac: Patent without evidence of aneurysm, dissection, vasculitis or significant stenosis. SMA: Patent without evidence of aneurysm, dissection, vasculitis or significant stenosis.  Renals: Both renal arteries are patent without evidence of aneurysm, dissection, vasculitis, fibromuscular dysplasia or significant stenosis. IMA: Patent Inflow: Patent without evidence of aneurysm, dissection, vasculitis or significant stenosis. Atherosclerosis of both common iliac arteries without significant stenosis or aneurysm. Proximal Outflow: Mild atherosclerosis of the left common femoral artery without significant stenosis. Veins: Intraluminal thrombus of the IVC. Patent portal and splenic veins. Review of the MIP images confirms the above findings. NON-VASCULAR Lower chest: The main pulmonary artery is nondilated measuring up to 2.4 cm. No large central pulmonary embolus of the included pulmonary arterial system. Heart size is enlarged without pericardial effusion or thickening. Bibasilar dependent atelectasis is noted. Hepatobiliary: No focal liver abnormality is seen. No gallstones, gallbladder wall thickening, or biliary dilatation. Pancreas: Unremarkable. No pancreatic ductal dilatation or surrounding inflammatory changes. Spleen: Previously noted splenic infarcts are no longer apparent. No mass or enhancing lesions. No new area of infarction. Adrenals/Urinary Tract: Normal bilateral adrenal glands. Small cortical cysts of both kidneys with areas of cortical scarring in the lower poles. No obstructive uropathy. Normal urinary bladder. Stomach/Bowel: The stomach is decompressed in appearance and there is no gastric fold thickening or mural thickening noted. The duodenum, ligament of Treitz in small bowel are nonacute. Moderate stool retention within the colon is identified with extensive circular muscle hypertrophy and sigmoid diverticulosis along the sigmoid colon. Minimal distal descending colonic diverticulosis is also noted. Appendix is not discretely identified. Lymphatic: No pathologically enlarged lymph nodes. Reproductive: Status post hysterectomy. No adnexal masses. Other: No pneumoperitoneum.   No abdominopelvic ascites. Musculoskeletal: Ankylosis of L4-5 cerclage wire overlying the posterior lower spinal elements. Anterolisthesis of L4 on L5 is noted. Mild Schmorl's node at L2. No aggressive osseous lesions. IMPRESSION: VASCULAR 1. Ectasia of the included ascending thoracic aorta. 2. Nonaneurysmal atherosclerotic abdominal aorta with patent branch vessels. No significant stenosis  or occlusion identified. NON-VASCULAR 1. Apparent resolution of previously noted splenic infarcts. No new infarct identified. 2. No bowel obstruction or inflammation. Extensive sigmoid diverticulosis without acute diverticulitis. Electronically Signed   By: Ashley Royalty M.D.   On: 04/02/2018 20:11     Past Medical History:  Diagnosis Date  . Anxiety   . Arthritis   . Blood transfusion   . C. difficile colitis   . Candida esophagitis ([place]) 2013   EGD  . Cataract   . Chronic kidney disease    STONES  . Diverticulitis 04/05/2017  . Diverticulosis of colon (without mention of hemorrhage) 2007   Colonoscopy  . Dysrhythmia    RX  . Emphysema of lung ([place])   . Family history of colon cancer    sister  . Fibromyalgia   . Fracture, zygoma closed ([place]) 07/13/2011  . GERD (gastroesophageal reflux disease)   . Headache(784.0)   . Hyperlipemia   . Hypertension   . Kidney stones   . Ptosis, bilateral   . Renal infarct ([place])   . Splenic infarct      Allergies  Allergen Reactions  . Latex Hives    TONGUE HAD BLISTERS WHEN HAD DENTAL SURGERY  . Amoxicillin Nausea And Vomiting  . Aspirin Other (See Comments)    Gi upset  . Barbiturates Other (See Comments)    Unknown reaction  . Celebrex [Celecoxib] Other (See Comments)    Unknown reaction  . Codeine Itching  . Evista [Raloxifene] Other (See Comments)    Unknown reaction  . Lipitor [Atorvastatin] Other (See Comments)    Unknown reaction  . Morphine And Related Other (See Comments)    Unknown reaction  . Oruvail [Ketoprofen] Other (See Comments)     Unknown reaction  . Pantoprazole Diarrhea  . Paraffin Other (See Comments)    Unknown reaction  . Prochlorperazine Other (See Comments)    Uncontrolled shaking  . Proloprim [Trimethoprim] Other (See Comments)    Unknown reaction  . Venlafaxine Nausea Only  . Vibra-Tab [Doxycycline] Other (See Comments)    Unknown reaction  . Vioxx [Rofecoxib] Other (See Comments)    edema  . Betadine [Povidone Iodine] Rash  . Caffeine Palpitations      Current Outpatient Medications on File Prior to Visit  Medication Sig Dispense Refill  . acetaminophen (TYLENOL) 500 MG tablet Take 500-1,000 mg by mouth every 6 (six) hours as needed for headache.    Marland Kitchen apixaban (ELIQUIS) 2.5 MG TABS tablet Take 1 tablet (2.5 mg total) by mouth 2 (two) times daily. 60 tablet 0  . atenolol (TENORMIN) 100 MG tablet TAKE 1 TABLET BY MOUTH DAILY FOR BLOOD PRESSURE 90 tablet 1  . Cholecalciferol (VITAMIN D) 2000 units tablet Take 4,000 Units by mouth daily.     . ciprofloxacin (CIPRO) 250 MG tablet Take 1 tablet (250 mg total) by mouth 2 (two) times daily for 10 days. 20 tablet 0  . clonazePAM (KLONOPIN) 1 MG tablet TAKE 1/2 TO 1 TABLET BY MOUTH 2-3 TIMES DAILY ONLY IF NEEDED FOR ANXIETY ATTACK AND LIMIT TO 5 DAYS PER WEEK TO AVOID ADDICTION (Patient taking differently: Take 0.5 mg by mouth 2 (two) times daily. ) 90 tablet 0  . diclofenac sodium (VOLTAREN) 1 % GEL Apply 2 g topically 4 (four) times daily.    Marland Kitchen dicyclomine (BENTYL) 10 MG capsule Take 10 mg by mouth daily.    . famotidine (PEPCID) 40 MG tablet Take 1 tablet (40 mg total) by mouth daily.  90 tablet 1  . fluconazole (DIFLUCAN) 100 MG tablet Take 1 tablet daily for yeast infection 7 tablet 0  . gabapentin (NEURONTIN) 600 MG tablet Take 1/2 to 1 tablet s 3 to 4 x / day as needed for Facial Pain (Patient taking differently: Take 600 mg by mouth 2 (two) times daily with a meal. ) 360 tablet 3  . hyoscyamine (LEVSIN SL) 0.125 MG SL tablet Dissolve 1 tablet under  tongue every 4 hours as needed for Nausea, Cramping , bloating or Diarrhea (Patient taking differently: Place 0.125 mg under the tongue every 4 (four) hours as needed for cramping (nausea, bloating, diarrhea). ) 120 tablet 1  . losartan (COZAAR) 100 MG tablet TAKE 1/2 TABLET BY MOUTH DAILY (Patient taking differently: Take 50 mg by mouth every evening. ) 90 tablet 1  . polyvinyl alcohol (ARTIFICIAL TEARS) 1.4 % ophthalmic solution Place 1 drop into both eyes daily as needed for dry eyes.    . pravastatin (PRAVACHOL) 40 MG tablet Take 0.5 tablets (20 mg total) by mouth every evening.    . traMADol (ULTRAM) 50 MG tablet TAKE 1 TABLET BY MOUTH 4 TIMES DAILY AS NEEDED FOR PAIN (Patient taking differently: Take 50 mg by mouth 4 (four) times daily as needed (pain). ) 120 tablet 1  . [DISCONTINUED] omeprazole (PRILOSEC OTC) 20 MG tablet Take 20 mg by mouth daily.     No current facility-administered medications on file prior to visit.     ROS: all negative except above.   Physical Exam: There were no vitals filed for this visit. There were no vitals taken for this visit. General Appearance: Well nourished, in no apparent distress. Eyes: PERRLA, EOMs, conjunctiva no swelling or erythema Sinuses: No Frontal/maxillary tenderness ENT/Mouth: Ext aud canals clear, TMs without erythema, bulging. No erythema, swelling, or exudate on post pharynx.  Tonsils not swollen or erythematous. Hearing normal.  Neck: Supple, thyroid normal.  Respiratory: Respiratory effort normal, BS equal bilaterally without rales, rhonchi, wheezing or stridor.  Cardio: RRR with no MRGs. Brisk peripheral pulses without edema.  Abdomen: Soft, + BS.  Non tender, no guarding, rebound, hernias, masses. Lymphatics: Non tender without lymphadenopathy.  Musculoskeletal: Full ROM, 5/5 strength, normal gait.  Skin: Warm, dry without rashes, lesions, ecchymosis.  Neuro: Cranial nerves intact. Normal muscle tone, no cerebellar symptoms.  Sensation intact.  Psych: Awake and oriented X 3, normal affect, Insight and Judgment appropriate.     Vicie Mutters, PA-C 9:47 AM [facility]

## 2018-04-08 ENCOUNTER — Encounter (HOSPITAL_COMMUNITY): Payer: Self-pay | Admitting: *Deleted

## 2018-04-08 ENCOUNTER — Emergency Department (HOSPITAL_COMMUNITY): Payer: PPO

## 2018-04-08 ENCOUNTER — Inpatient Hospital Stay (HOSPITAL_COMMUNITY)
Admission: EM | Admit: 2018-04-08 | Discharge: 2018-04-12 | DRG: 392 | Disposition: A | Discharge status: Home / Self Care | Payer: PPO | Attending: Internal Medicine | Admitting: Internal Medicine

## 2018-04-08 ENCOUNTER — Other Ambulatory Visit: Payer: Self-pay

## 2018-04-08 DIAGNOSIS — E559 Vitamin D deficiency, unspecified: Secondary | ICD-10-CM | POA: Diagnosis present

## 2018-04-08 DIAGNOSIS — Z8249 Family history of ischemic heart disease and other diseases of the circulatory system: Secondary | ICD-10-CM

## 2018-04-08 DIAGNOSIS — Z885 Allergy status to narcotic agent status: Secondary | ICD-10-CM | POA: Diagnosis not present

## 2018-04-08 DIAGNOSIS — F419 Anxiety disorder, unspecified: Secondary | ICD-10-CM | POA: Diagnosis not present

## 2018-04-08 DIAGNOSIS — I10 Essential (primary) hypertension: Secondary | ICD-10-CM | POA: Diagnosis not present

## 2018-04-08 DIAGNOSIS — Z881 Allergy status to other antibiotic agents status: Secondary | ICD-10-CM

## 2018-04-08 DIAGNOSIS — E785 Hyperlipidemia, unspecified: Secondary | ICD-10-CM | POA: Diagnosis present

## 2018-04-08 DIAGNOSIS — Z803 Family history of malignant neoplasm of breast: Secondary | ICD-10-CM

## 2018-04-08 DIAGNOSIS — R1032 Left lower quadrant pain: Secondary | ICD-10-CM | POA: Diagnosis not present

## 2018-04-08 DIAGNOSIS — I2583 Coronary atherosclerosis due to lipid rich plaque: Secondary | ICD-10-CM | POA: Diagnosis not present

## 2018-04-08 DIAGNOSIS — M545 Low back pain: Secondary | ICD-10-CM | POA: Diagnosis not present

## 2018-04-08 DIAGNOSIS — Z90710 Acquired absence of both cervix and uterus: Secondary | ICD-10-CM

## 2018-04-08 DIAGNOSIS — K5792 Diverticulitis of intestine, part unspecified, without perforation or abscess without bleeding: Secondary | ICD-10-CM | POA: Diagnosis not present

## 2018-04-08 DIAGNOSIS — M797 Fibromyalgia: Secondary | ICD-10-CM | POA: Diagnosis present

## 2018-04-08 DIAGNOSIS — K219 Gastro-esophageal reflux disease without esophagitis: Secondary | ICD-10-CM | POA: Diagnosis present

## 2018-04-08 DIAGNOSIS — Z961 Presence of intraocular lens: Secondary | ICD-10-CM | POA: Diagnosis not present

## 2018-04-08 DIAGNOSIS — K5732 Diverticulitis of large intestine without perforation or abscess without bleeding: Principal | ICD-10-CM | POA: Diagnosis present

## 2018-04-08 DIAGNOSIS — J449 Chronic obstructive pulmonary disease, unspecified: Secondary | ICD-10-CM | POA: Diagnosis not present

## 2018-04-08 DIAGNOSIS — Z7901 Long term (current) use of anticoagulants: Secondary | ICD-10-CM

## 2018-04-08 DIAGNOSIS — Z800 Family history of malignant neoplasm of digestive organs: Secondary | ICD-10-CM | POA: Diagnosis not present

## 2018-04-08 DIAGNOSIS — Z87442 Personal history of urinary calculi: Secondary | ICD-10-CM | POA: Diagnosis not present

## 2018-04-08 DIAGNOSIS — N280 Ischemia and infarction of kidney: Secondary | ICD-10-CM | POA: Diagnosis not present

## 2018-04-08 DIAGNOSIS — E871 Hypo-osmolality and hyponatremia: Secondary | ICD-10-CM | POA: Diagnosis not present

## 2018-04-08 DIAGNOSIS — R1030 Lower abdominal pain, unspecified: Secondary | ICD-10-CM

## 2018-04-08 DIAGNOSIS — M5489 Other dorsalgia: Secondary | ICD-10-CM | POA: Diagnosis not present

## 2018-04-08 DIAGNOSIS — J439 Emphysema, unspecified: Secondary | ICD-10-CM | POA: Diagnosis present

## 2018-04-08 DIAGNOSIS — R1084 Generalized abdominal pain: Secondary | ICD-10-CM | POA: Diagnosis not present

## 2018-04-08 DIAGNOSIS — K760 Fatty (change of) liver, not elsewhere classified: Secondary | ICD-10-CM | POA: Diagnosis not present

## 2018-04-08 DIAGNOSIS — D735 Infarction of spleen: Secondary | ICD-10-CM | POA: Diagnosis not present

## 2018-04-08 DIAGNOSIS — R52 Pain, unspecified: Secondary | ICD-10-CM | POA: Diagnosis not present

## 2018-04-08 LAB — URINALYSIS, ROUTINE W REFLEX MICROSCOPIC
Bacteria, UA: NONE SEEN
Bilirubin Urine: NEGATIVE
GLUCOSE, UA: 50 mg/dL — AB
Ketones, ur: NEGATIVE mg/dL
LEUKOCYTES UA: NEGATIVE
Nitrite: NEGATIVE
PROTEIN: NEGATIVE mg/dL
SPECIFIC GRAVITY, URINE: 1.003 — AB (ref 1.005–1.030)
pH: 7 (ref 5.0–8.0)

## 2018-04-08 LAB — CBC WITH DIFFERENTIAL/PLATELET
Abs Immature Granulocytes: 0.05 10*3/uL (ref 0.00–0.07)
Basophils Absolute: 0 10*3/uL (ref 0.0–0.1)
Basophils Relative: 0 %
EOS ABS: 0.1 10*3/uL (ref 0.0–0.5)
EOS PCT: 1 %
HEMATOCRIT: 35 % — AB (ref 36.0–46.0)
HEMOGLOBIN: 11.3 g/dL — AB (ref 12.0–15.0)
Immature Granulocytes: 1 %
LYMPHS PCT: 8 %
Lymphs Abs: 0.9 10*3/uL (ref 0.7–4.0)
MCH: 30.7 pg (ref 26.0–34.0)
MCHC: 32.3 g/dL (ref 30.0–36.0)
MCV: 95.1 fL (ref 80.0–100.0)
MONO ABS: 1.2 10*3/uL — AB (ref 0.1–1.0)
MONOS PCT: 11 %
NRBC: 0 % (ref 0.0–0.2)
Neutro Abs: 8.6 10*3/uL — ABNORMAL HIGH (ref 1.7–7.7)
Neutrophils Relative %: 79 %
Platelets: 243 10*3/uL (ref 150–400)
RBC: 3.68 MIL/uL — ABNORMAL LOW (ref 3.87–5.11)
RDW: 12.3 % (ref 11.5–15.5)
WBC: 10.8 10*3/uL — ABNORMAL HIGH (ref 4.0–10.5)

## 2018-04-08 LAB — COMPREHENSIVE METABOLIC PANEL
ALK PHOS: 62 U/L (ref 38–126)
ALT: 14 U/L (ref 0–44)
AST: 24 U/L (ref 15–41)
Albumin: 3.7 g/dL (ref 3.5–5.0)
Anion gap: 8 (ref 5–15)
BUN: 6 mg/dL — AB (ref 8–23)
CALCIUM: 8.9 mg/dL (ref 8.9–10.3)
CO2: 27 mmol/L (ref 22–32)
CREATININE: 0.84 mg/dL (ref 0.44–1.00)
Chloride: 93 mmol/L — ABNORMAL LOW (ref 98–111)
GFR calc Af Amer: >60 mL/min (ref >60)
GFR calc non Af Amer: >60 mL/min (ref >60)
Glucose, Bld: 142 mg/dL — ABNORMAL HIGH (ref 70–99)
Potassium: 4.1 mmol/L (ref 3.5–5.1)
SODIUM: 128 mmol/L — AB (ref 135–145)
Total Bilirubin: 0.6 mg/dL (ref 0.3–1.2)
Total Protein: 6.8 g/dL (ref 6.5–8.1)

## 2018-04-08 LAB — LIPASE, BLOOD: Lipase: 37 U/L (ref 11–51)

## 2018-04-08 MED ORDER — MORPHINE SULFATE (PF) 2 MG/ML IV SOLN
2.0000 mg | Freq: Once | INTRAVENOUS | Status: AC
  Administered 2018-04-08: 2 mg via INTRAVENOUS
  Filled 2018-04-08: qty 1

## 2018-04-08 MED ORDER — VITAMIN D 1000 UNITS PO TABS
4000.0000 [IU] | ORAL_TABLET | Freq: Every day | ORAL | Status: DC
  Administered 2018-04-08: 4000 [IU] via ORAL
  Filled 2018-04-08 (×2): qty 4

## 2018-04-08 MED ORDER — HYOSCYAMINE SULFATE 0.125 MG SL SUBL
0.1250 mg | SUBLINGUAL_TABLET | SUBLINGUAL | Status: DC | PRN
  Filled 2018-04-08: qty 1

## 2018-04-08 MED ORDER — CIPROFLOXACIN IN D5W 400 MG/200ML IV SOLN
400.0000 mg | Freq: Once | INTRAVENOUS | Status: AC
  Administered 2018-04-08: 400 mg via INTRAVENOUS
  Filled 2018-04-08: qty 200

## 2018-04-08 MED ORDER — TRAMADOL HCL 50 MG PO TABS
50.0000 mg | ORAL_TABLET | Freq: Four times a day (QID) | ORAL | Status: DC | PRN
  Administered 2018-04-09 – 2018-04-11 (×8): 50 mg via ORAL
  Filled 2018-04-08 (×8): qty 1

## 2018-04-08 MED ORDER — PRAVASTATIN SODIUM 10 MG PO TABS
20.0000 mg | ORAL_TABLET | Freq: Every evening | ORAL | Status: DC
  Administered 2018-04-08: 20 mg via ORAL
  Filled 2018-04-08: qty 2

## 2018-04-08 MED ORDER — CLONAZEPAM 0.5 MG PO TABS
0.5000 mg | ORAL_TABLET | Freq: Two times a day (BID) | ORAL | Status: DC
  Administered 2018-04-08 – 2018-04-12 (×8): 0.5 mg via ORAL
  Filled 2018-04-08 (×9): qty 1

## 2018-04-08 MED ORDER — METRONIDAZOLE IN NACL 5-0.79 MG/ML-% IV SOLN
500.0000 mg | Freq: Three times a day (TID) | INTRAVENOUS | Status: DC
  Administered 2018-04-08 – 2018-04-12 (×11): 500 mg via INTRAVENOUS
  Filled 2018-04-08 (×13): qty 100

## 2018-04-08 MED ORDER — ONDANSETRON HCL 4 MG PO TABS
4.0000 mg | ORAL_TABLET | Freq: Four times a day (QID) | ORAL | Status: DC | PRN
  Administered 2018-04-11: 4 mg via ORAL
  Filled 2018-04-08: qty 1

## 2018-04-08 MED ORDER — ATENOLOL 50 MG PO TABS: 50.0000 mg | ORAL_TABLET | Freq: Two times a day (BID) | ORAL | Status: DC

## 2018-04-08 MED ORDER — HYDROCODONE-ACETAMINOPHEN 7.5-325 MG/15ML PO SOLN
10.0000 mL | ORAL | Status: DC | PRN
  Administered 2018-04-08 – 2018-04-12 (×4): 10 mL via ORAL
  Filled 2018-04-08 (×5): qty 15

## 2018-04-08 MED ORDER — DICYCLOMINE HCL 10 MG PO CAPS
10.0000 mg | ORAL_CAPSULE | Freq: Every day | ORAL | Status: DC
  Administered 2018-04-08 – 2018-04-12 (×5): 10 mg via ORAL
  Filled 2018-04-08 (×5): qty 1

## 2018-04-08 MED ORDER — FENTANYL CITRATE (PF) 100 MCG/2ML IJ SOLN
50.0000 ug | Freq: Once | INTRAMUSCULAR | Status: AC
  Administered 2018-04-08: 50 ug via INTRAVENOUS
  Filled 2018-04-08: qty 2

## 2018-04-08 MED ORDER — CIPROFLOXACIN IN D5W 400 MG/200ML IV SOLN
400.0000 mg | Freq: Two times a day (BID) | INTRAVENOUS | Status: DC
  Administered 2018-04-09 – 2018-04-12 (×7): 400 mg via INTRAVENOUS
  Filled 2018-04-08 (×8): qty 200

## 2018-04-08 MED ORDER — MORPHINE SULFATE (PF) 2 MG/ML IV SOLN
1.0000 mg | INTRAVENOUS | Status: DC | PRN
  Administered 2018-04-08 – 2018-04-11 (×5): 1 mg via INTRAVENOUS
  Filled 2018-04-08 (×5): qty 1

## 2018-04-08 MED ORDER — IPRATROPIUM-ALBUTEROL 0.5-2.5 (3) MG/3ML IN SOLN
3.0000 mL | Freq: Four times a day (QID) | RESPIRATORY_TRACT | Status: DC
  Administered 2018-04-09: 3 mL via RESPIRATORY_TRACT
  Filled 2018-04-08 (×2): qty 3

## 2018-04-08 MED ORDER — APIXABAN 2.5 MG PO TABS
2.5000 mg | ORAL_TABLET | Freq: Two times a day (BID) | ORAL | Status: DC
  Administered 2018-04-08 – 2018-04-09 (×2): 2.5 mg via ORAL
  Filled 2018-04-08 (×2): qty 1

## 2018-04-08 MED ORDER — ATENOLOL 50 MG PO TABS
50.0000 mg | ORAL_TABLET | Freq: Two times a day (BID) | ORAL | Status: DC
  Administered 2018-04-08 – 2018-04-09 (×2): 50 mg via ORAL
  Filled 2018-04-08 (×2): qty 1

## 2018-04-08 MED ORDER — METRONIDAZOLE IN NACL 5-0.79 MG/ML-% IV SOLN: 500.0000 mg | Freq: Once | INTRAVENOUS | Status: DC

## 2018-04-08 MED ORDER — FAMOTIDINE 20 MG PO TABS
40.0000 mg | ORAL_TABLET | Freq: Every day | ORAL | Status: DC
  Administered 2018-04-08 – 2018-04-12 (×5): 40 mg via ORAL
  Filled 2018-04-08 (×5): qty 2

## 2018-04-08 MED ORDER — ONDANSETRON HCL 4 MG/2ML IJ SOLN
4.0000 mg | Freq: Four times a day (QID) | INTRAMUSCULAR | Status: DC | PRN
  Administered 2018-04-09 – 2018-04-11 (×3): 4 mg via INTRAVENOUS
  Filled 2018-04-08 (×3): qty 2

## 2018-04-08 MED ORDER — GABAPENTIN 300 MG PO CAPS
300.0000 mg | ORAL_CAPSULE | Freq: Two times a day (BID) | ORAL | Status: DC
  Administered 2018-04-08 – 2018-04-12 (×8): 300 mg via ORAL
  Filled 2018-04-08 (×8): qty 1

## 2018-04-08 MED ORDER — FAMOTIDINE 20 MG PO TABS: 20.0000 mg | ORAL_TABLET | Freq: Every day | ORAL | Status: DC

## 2018-04-08 MED ORDER — SODIUM CHLORIDE 0.9 % IV BOLUS
500.0000 mL | Freq: Once | INTRAVENOUS | Status: AC
  Administered 2018-04-08: 500 mL via INTRAVENOUS

## 2018-04-08 MED ORDER — IOHEXOL 300 MG/ML  SOLN
100.0000 mL | Freq: Once | INTRAMUSCULAR | Status: AC | PRN
  Administered 2018-04-08: 100 mL via INTRAVENOUS

## 2018-04-08 MED ORDER — ONDANSETRON HCL 4 MG/2ML IJ SOLN
4.0000 mg | Freq: Once | INTRAMUSCULAR | Status: AC
  Administered 2018-04-08: 4 mg via INTRAVENOUS
  Filled 2018-04-08: qty 2

## 2018-04-08 MED ORDER — SODIUM CHLORIDE 0.9 % IV SOLN
INTRAVENOUS | Status: DC
  Administered 2018-04-08 – 2018-04-10 (×3): via INTRAVENOUS

## 2018-04-08 MED ORDER — GABAPENTIN 300 MG PO CAPS
600.0000 mg | ORAL_CAPSULE | Freq: Every day | ORAL | Status: DC
  Administered 2018-04-08 – 2018-04-11 (×4): 600 mg via ORAL
  Filled 2018-04-08 (×4): qty 2

## 2018-04-08 NOTE — ED Notes (Signed)
Got patient undress on the monitor patient is resting with family at bedside and call bell in reach 

## 2018-04-08 NOTE — ED Triage Notes (Signed)
Pt in from home via [facility], per report pt on Eliquis for tx of renal blood clot, pt reports abd pain that radiates to the R  back today, pt denies n/v/d, A&O x4

## 2018-04-08 NOTE — H&P (Signed)
[facility]                                                                                    Patient Demographics  Debra White, is a 84 y.o. female  CSN: [id number]  MRN: [id number]  DOB - [birth year]  Admit Date - 04/08/2018  Outpatient Primary MD for the patient is Unk Pinto, MD   With History of -  Past Medical History:  Diagnosis Date  . Anxiety   . Arthritis   . Blood transfusion   . C. difficile colitis   . Candida esophagitis ([place]) 2013   EGD  . Cataract   . Chronic kidney disease    STONES  . Diverticulitis 04/05/2017  . Diverticulosis of colon (without mention of hemorrhage) 2007   Colonoscopy  . Dysrhythmia    RX  . Emphysema of lung ([place])   . Family history of colon cancer    sister  . Fibromyalgia   . Fracture, zygoma closed ([place]) 07/13/2011  . GERD (gastroesophageal reflux disease)   . Headache(784.0)   . Hyperlipemia   . Hypertension   . Kidney stones   . Ptosis, bilateral   . Renal infarct ([place])   . Splenic infarct       Past Surgical History:  Procedure Laterality Date  . ABDOMINAL HYSTERECTOMY    . BACK SURGERY     X2  . BACK SURGERY    . BREAST EXCISIONAL BIOPSY    . CERVICAL DISC SURGERY    . COLONOSCOPY    . FACIAL FRACTURE SURGERY    . HAND SURGERY     BIL   . ORIF TRIPOD FRACTURE  07/13/2011   Procedure: OPEN REDUCTION INTERNAL FIXATION (ORIF) TRIPOD FRACTURE;  Surgeon: Tyson Alias, MD;  Location: [place];  Service: ENT;  Laterality: Right;  ORIF RIGHT ZYGOMA, ORBITAL FLOOR EXPLORATION WITH FROST STITCH (TEMPORARY TARSORRHAPHY)  . PTOSIS REPAIR Bilateral 11/22/2016   Procedure: INTERNAL PTOSIS REPAIR;  Surgeon: Clista Bernhardt, MD;  Location: [place];  Service: Ophthalmology;  Laterality: Bilateral;  . SHOULDER ARTHROSCOPY W/ ROTATOR CUFF REPAIR     LFT    in for   Chief Complaint  Patient presents with  . Abdominal Pain     HPI  Debra White  is a 84 y.o. 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  Patient reports chills but no documented fever.  No history of diarrhea.  Patient was recently  discharged home with a monitor to rule out paroxysmal A. fib which she could not wear due to allergies.  Patient has a long list of allergies and cannot take amoxicillin.  In the emergency room the work-up showed diverticulitis and the patient was started on IV antibiotics Cipro and Flagyl.    Review of Systems    In addition to the HPI above,  No Headache, No changes with Vision or hearing, No problems swallowing food or Liquids, No Chest pain, Cough or Shortness of Breath, No Nausea or Vomiting, Bowel movements are regular, No  Blood in stool or Urine, No dysuria, No new skin rashes or bruises, No new joints pains-aches,  No new weakness, tingling, numbness in any extremity, No recent weight gain or loss, No polyuria, polydypsia or polyphagia, No significant Mental Stressors.  A full 10 point Review of Systems was done, except as stated above, all other Review of Systems were negative.   Social History Social History   Tobacco Use  . Smoking status: Never Smoker  . Smokeless tobacco: Never Used  Substance Use Topics  . Alcohol use: No     Family History Family History  Problem Relation Age of Onset  . Heart disease Mother   . Heart disease Father   . Breast cancer Sister 77  . Colon cancer Sister   . Cancer Sister        breast  . Cancer Sister        colon  . Cancer Sister        colon  . Hypertension Daughter   . Hyperlipidemia Daughter      Prior to Admission medications   Medication Sig Start Date End Date Taking? Authorizing Provider  acetaminophen (TYLENOL) 500 MG tablet Take 500-1,000 mg by mouth every 6 (six) hours as needed for headache.   Yes [provider]  apixaban (ELIQUIS) 2.5 MG  TABS tablet Take 1 tablet (2.5 mg total) by mouth 2 (two) times daily. 03/24/18  Yes Vann, Jessica U, DO  atenolol (TENORMIN) 100 MG tablet TAKE 1 TABLET BY MOUTH DAILY FOR BLOOD PRESSURE Patient taking differently: Take 50 mg by mouth 2 (two) times daily.  03/26/18  Yes Vicie Mutters, PA-C  Cholecalciferol (VITAMIN D) 2000 units tablet Take 4,000 Units by mouth daily.    Yes [provider]  ciprofloxacin (CIPRO) 250 MG tablet Take 1 tablet (250 mg total) by mouth 2 (two) times daily for 10 days. 04/04/18 04/14/18 Yes Unk Pinto, MD  clonazePAM (KLONOPIN) 1 MG tablet TAKE 1/2 TO 1 TABLET BY MOUTH 2-3 TIMES DAILY ONLY IF NEEDED FOR ANXIETY ATTACK AND LIMIT TO 5 DAYS PER WEEK TO AVOID ADDICTION Patient taking differently: Take 0.5 mg by mouth 2 (two) times daily.  03/19/18  Yes Unk Pinto, MD  diclofenac sodium (VOLTAREN) 1 % GEL Apply 2 g topically 4 (four) times daily. 03/24/18  Yes Vann, Jessica U, DO  dicyclomine (BENTYL) 10 MG capsule Take 10 mg by mouth daily. 03/10/18  Yes [provider]  famotidine (PEPCID) 40 MG tablet Take 1 tablet (40 mg total) by mouth daily. 03/28/18  Yes Unk Pinto, MD  fluconazole (DIFLUCAN) 100 MG tablet Take 1 tablet daily for yeast infection 04/04/18  Yes Unk Pinto, MD  gabapentin (NEURONTIN) 600 MG tablet Take 1/2 to 1 tablet s 3 to 4 x / day as needed for Facial Pain Patient taking differently: Take 300-600 mg by mouth See admin instructions. Take 300 mg in the morning, 300 mg at 1400 and 600 mg at bedtime for facial pain 11/14/17 11/15/18 Yes Unk Pinto, MD  hyoscyamine (LEVSIN SL) 0.125 MG SL tablet Dissolve 1 tablet under tongue every 4 hours as needed for Nausea, Cramping , bloating or Diarrhea Patient taking differently: Place 0.125 mg under the tongue every 4 (four) hours as needed for cramping (nausea, bloating, diarrhea).  03/07/18  Yes Unk Pinto, MD  losartan (COZAAR) 100 MG tablet TAKE 1/2 TABLET BY MOUTH  DAILY Patient taking differently: Take 50 mg by mouth every evening.  06/07/17  Yes Unk Pinto, MD  pravastatin (PRAVACHOL) 40 MG tablet Take 0.5 tablets (20 mg total) by mouth every evening. 03/24/18  Yes Vann, Jessica U, DO  ranitidine (ZANTAC) 300 MG tablet Take 300 mg by mouth at bedtime.   Yes [provider]  traMADol (ULTRAM) 50 MG tablet TAKE 1 TABLET BY MOUTH 4 TIMES DAILY AS NEEDED FOR PAIN Patient taking differently: Take 50 mg by mouth 4 (four) times daily as needed (pain).  08/12/17  Yes Vicie Mutters, PA-C  omeprazole (PRILOSEC OTC) 20 MG tablet Take 20 mg by mouth daily.  09/01/11  [provider]    Allergies  Allergen Reactions  . Latex Hives    TONGUE HAD BLISTERS WHEN HAD DENTAL SURGERY  . Amoxicillin Nausea And Vomiting  . Aspirin Other (See Comments)    Gi upset  . Barbiturates Other (See Comments)    Unknown reaction  . Celebrex [Celecoxib] Other (See Comments)    Unknown reaction  . Codeine Itching  . Evista [Raloxifene] Other (See Comments)    Unknown reaction  . Lipitor [Atorvastatin] Other (See Comments)    Unknown reaction  . Morphine And Related Other (See Comments)    Unknown reaction  . Oruvail [Ketoprofen] Other (See Comments)    Unknown reaction  . Pantoprazole Diarrhea  . Paraffin Other (See Comments)    Unknown reaction  . Prochlorperazine Other (See Comments)    Uncontrolled shaking  . Proloprim [Trimethoprim] Other (See Comments)    Unknown reaction  . Venlafaxine Nausea Only  . Vibra-Tab [Doxycycline] Other (See Comments)    Unknown reaction  . Vioxx [Rofecoxib] Other (See Comments)    edema  . Betadine [Povidone Iodine] Rash  . Caffeine Palpitations    Physical Exam  Vitals  Blood pressure (!) 153/88, pulse 67, temperature 98.2 F (36.8 C), temperature source Oral, resp. rate 16, height 5\' 4"  (1.626 m), weight 53.5 kg, SpO2 95 %.   1. General chronically ill elderly, very pleasant  2. Normal affect and  insight, Not Suicidal or Homicidal, Awake Alert, Oriented X 3.  3. No F.N deficits, grossly, patient moving all extremities.  4. Ears and Eyes appear Normal, Conjunctivae clear, PERRLA. Moist Oral Mucosa.  5. Supple Neck, No JVD, No cervical lymphadenopathy appriciated, No Carotid Bruits.  6. Symmetrical Chest wall movement, decreased breath sounds, no wheezing.  7. RRR, No Gallops, Rubs or Murmurs, No Parasternal Heave.  8. Positive Bowel Sounds, Abdomen Soft, mild lower abdominal tenderness.  9.  No Cyanosis, Normal Skin Turgor, No Skin Rash or Bruise.  10. Good muscle tone,  joints appear normal , no effusions, Normal ROM.    Data Review  CBC Recent Labs  Lab 04/02/18 1309 04/08/18 0836  WBC 8.7 10.8*  HGB 12.2 11.3*  HCT 37.7 35.0*  PLT 254 243  MCV 96.4 95.1  MCH 31.2 30.7  MCHC 32.4 32.3  RDW 12.6 12.3  LYMPHSABS  --  0.9  MONOABS  --  1.2*  EOSABS  --  0.1  BASOSABS  --  0.0   ------------------------------------------------------------------------------------------------------------------  Chemistries  Recent Labs  Lab 04/02/18 1309 04/08/18 0836  NA 130* 128*  K 3.9 4.1  CL 95* 93*  CO2 28 27  GLUCOSE 154* 142*  BUN 12 6*  CREATININE 0.84 0.84  CALCIUM 9.0 8.9  AST 20 24  ALT 16 14  ALKPHOS 58 62  BILITOT 0.8 0.6   ------------------------------------------------------------------------------------------------------------------ estimated creatinine clearance is 43.6 mL/min (by C-G formula based on SCr of 0.84 mg/dL). ------------------------------------------------------------------------------------------------------------------ No  results for input(s): TSH, T4TOTAL, T3FREE, THYROIDAB in the last 72 hours.  Invalid input(s): FREET3   Coagulation profile No results for input(s): INR, PROTIME in the last 168 hours. ------------------------------------------------------------------------------------------------------------------- No  results for input(s): DDIMER in the last 72 hours. -------------------------------------------------------------------------------------------------------------------  Cardiac Enzymes No results for input(s): CKMB, TROPONINI, MYOGLOBIN in the last 168 hours.  Invalid input(s): CK ------------------------------------------------------------------------------------------------------------------ Invalid input(s): POCBNP   ---------------------------------------------------------------------------------------------------------------  Urinalysis    Component Value Date/Time   COLORURINE COLORLESS (A) 04/08/2018 0834   APPEARANCEUR CLEAR 04/08/2018 0834   LABSPEC 1.003 (L) 04/08/2018 0834   PHURINE 7.0 04/08/2018 0834   GLUCOSEU 50 (A) 04/08/2018 0834   HGBUR SMALL (A) 04/08/2018 0834   BILIRUBINUR NEGATIVE 04/08/2018 0834   KETONESUR NEGATIVE 04/08/2018 0834   PROTEINUR NEGATIVE 04/08/2018 0834   UROBILINOGEN 0.2 04/13/2015 1700   NITRITE NEGATIVE 04/08/2018 0834   LEUKOCYTESUR NEGATIVE 04/08/2018 0834    ----------------------------------------------------------------------------------------------------------------   Imaging results:   Mr Brain Wo Contrast  Result Date: 03/23/2018 CLINICAL DATA:  RIGHT-sided numbness. History of hypertension, hyperlipidemia. EXAM: MRI HEAD WITHOUT CONTRAST TECHNIQUE: Multiplanar, multiecho pulse sequences of the brain and surrounding structures were obtained without intravenous contrast. COMPARISON:  MRI head September 15, 2017 FINDINGS: INTRACRANIAL CONTENTS: No reduced diffusion to suggest acute ischemia. No susceptibility artifact to suggest hemorrhage. The ventricles and sulci are normal for patient's age. Small area RIGHT occipital lobe encephalomalacia new from prior MRI. Scattered subcentimeter supratentorial white matter FLAIR T2 hyperintensities compatible with mild chronic small vessel ischemic changes, less than expected for age. No suspicious  parenchymal signal, masses, mass effect. No abnormal extra-axial fluid collections. No extra-axial masses. VASCULAR: Normal major intracranial vascular flow voids present at skull base. SKULL AND UPPER CERVICAL SPINE: No abnormal sellar expansion. No suspicious calvarial bone marrow signal. ACDF. Craniocervical junction maintained. SINUSES/ORBITS: The mastoid air-cells and included paranasal sinuses are well-aerated.The included ocular globes and orbital contents are non-suspicious. Status post bilateral ocular lens implants. OTHER: None. IMPRESSION: 1. No acute intracranial process. 2. Old small RIGHT occipital lobe/PCA territory infarct new from 2018. 3. Mild chronic small vessel ischemic changes. Electronically Signed   By: Elon Alas M.D.   On: 03/23/2018 06:34   Ct Abdomen Pelvis W Contrast  Result Date: 04/08/2018 CLINICAL DATA:  84 year old female on blood thinner for renal blood clot. Abdominal pain. Prior hysterectomy. Subsequent encounter. EXAM: CT ABDOMEN AND PELVIS WITH CONTRAST TECHNIQUE: Multidetector CT imaging of the abdomen and pelvis was performed using the standard protocol following bolus administration of intravenous contrast. CONTRAST:  1102mL OMNIPAQUE IOHEXOL 300 MG/ML  SOLN COMPARISON:  04/02/2018 CT. FINDINGS: Lower chest: Minimal basilar atelectasis. Mild cardiomegaly. Coronary artery calcifications. Slightly dense breast parenchyma. Hepatobiliary: Enlarged liver spanning over 20.2 cm. No worrisome hepatic lesion. No calcified gallstones. No CT evidence of gallbladder inflammation. Pancreas: No worrisome pancreatic mass or inflammation. Spleen: Remote splenic infarcts. Adrenals/Urinary Tract: No obstructing stone or hydronephrosis. Result of infarct inferior right kidney. Subcentimeter low-density structures left kidney too small to characterize although statistically likely cysts. No adrenal lesion. Noncontrast filled imaging of the urinary bladder unremarkable. Stomach/Bowel:  Interval development of sigmoid colon diverticulitis. Inflammatory process extends towards the left without drainable abscess or free air. Significant baseline diverticula and muscular hypertrophy. Vascular/Lymphatic: Atherosclerotic changes abdominal aorta and aortic branch vessels. No abdominal aortic aneurysm or large arterial vessel occlusion. Portal vein with peripheral hypodensity on early phase imaging fills in on delayed imaging and therefore probably related to mixing of opacified and unopacified blood rather than thrombus.  Scattered normal size lymph nodes. Reproductive: Post hysterectomy.  No worrisome adnexal mass. Other: No free intraperitoneal air or bowel containing hernia. No retroperitoneal hematoma. Musculoskeletal: Fusion lower thoracic spine and lower lumbar/upper sacrum. Schmorl's node deformity superior endplate L2. No acute osseous abnormality. IMPRESSION: 1. Interval development of sigmoid colon diverticulitis. Inflammatory process extends towards the left without drainable abscess or free air. Significant baseline diverticula and muscular hypertrophy. 2. Remote splenic and right renal infarct.  No acute infarct noted. 3. Aortic Atherosclerosis (ICD10-I70.0). Coronary artery calcifications. 4. Enlarged liver Electronically Signed   By: Genia Del M.D.   On: 04/08/2018 12:49   Ct Abdomen Pelvis W Contrast  Result Date: 03/22/2018 CLINICAL DATA:  Severe lower abdominal and right flank pain. History of diverticulitis. EXAM: CT ABDOMEN AND PELVIS WITH CONTRAST TECHNIQUE: Multidetector CT imaging of the abdomen and pelvis was performed using the standard protocol following bolus administration of intravenous contrast. CONTRAST:  159mL OMNIPAQUE IOHEXOL 300 MG/ML  SOLN COMPARISON:  09/08/2017 CT abdomen/pelvis. FINDINGS: Lower chest: No significant pulmonary nodules or acute consolidative airspace disease. Coronary atherosclerosis. Hepatobiliary: Normal liver size. No liver mass. Normal  gallbladder with no radiopaque cholelithiasis. No biliary ductal dilatation. Pancreas: Normal, with no mass or duct dilation. Spleen: There is a wedge-shaped focus of low attenuation in the inferior spleen (series 3/image 20), new, compatible with acute splenic infarct. There is a curvilinear focus of low attenuation in the superior spleen, new, compatible with subacute/chronic infarct. Adrenals/Urinary Tract: Normal adrenals. New small poorly defined low-attenuation focus in the posterior lower right kidney (series 8/image 21), suggestive of renal cortical infarct. Subcentimeter hypodense renal cortical lesions in the left kidney are too small to characterize and are stable, considered benign. No hydronephrosis. Normal bladder. Stomach/Bowel: Normal non-distended stomach. Normal caliber small bowel with no small bowel wall thickening. Appendix not discretely visualized. No pericecal inflammatory changes. Marked sigmoid diverticulosis, with no large bowel wall thickening or significant pericolonic fat stranding. Vascular/Lymphatic: Atherosclerotic nonaneurysmal abdominal aorta. Patent portal, splenic, hepatic and renal veins. No pathologically enlarged lymph nodes in the abdomen or pelvis. Reproductive: Status post hysterectomy, with no abnormal findings at the vaginal cuff. No adnexal mass. Other: No pneumoperitoneum, ascites or focal fluid collection. Musculoskeletal: No aggressive appearing focal osseous lesions. Ankylosis at L4-5 with cerclage wire overlying the posterior lower lumbar spine elements. Moderate lumbar spondylosis. IMPRESSION: 1. Wedge-shaped focus of low attenuation in the inferior spleen, new, compatible with acute splenic infarct. Curvilinear focus of low attenuation in the superior spleen, new, compatible with subacute/chronic infarct. 2. Nonspecific new small poorly defined low-attenuation focus in the posterior lower right kidney, suggestive of acute renal cortical infarct. 3. Consider  echocardiographic correlation to exclude a cardiac source of emboli. 4. Marked sigmoid diverticulosis. No evidence of acute diverticulitis. 5.  Aortic Atherosclerosis (ICD10-I70.0). 6. Coronary atherosclerosis. Electronically Signed   By: Ilona Sorrel M.D.   On: 03/22/2018 18:51   Ct Angio Abd/pel W And/or Wo Contrast  Result Date: 04/02/2018 CLINICAL DATA:  Continue nausea, vomiting and abdominal pain. Patient actively vomiting and triage. EXAM: CTA ABDOMEN AND PELVIS WITHOUT AND WITH CONTRAST TECHNIQUE: Multidetector CT imaging of the abdomen and pelvis was performed using the standard protocol during bolus administration of intravenous contrast. Multiplanar reconstructed images and MIPs were obtained and reviewed to evaluate the vascular anatomy. CONTRAST:  141mL ISOVUE-370 IOPAMIDOL (ISOVUE-370) INJECTION 76% COMPARISON:  03/22/2018 FINDINGS: VASCULAR Aorta: Mild ectasia of the ascending included thoracic aorta to 3.5 cm. Mild atherosclerosis of the abdominal aorta without aneurysmal  dilatation dissection. Celiac: Patent without evidence of aneurysm, dissection, vasculitis or significant stenosis. SMA: Patent without evidence of aneurysm, dissection, vasculitis or significant stenosis. Renals: Both renal arteries are patent without evidence of aneurysm, dissection, vasculitis, fibromuscular dysplasia or significant stenosis. IMA: Patent Inflow: Patent without evidence of aneurysm, dissection, vasculitis or significant stenosis. Atherosclerosis of both common iliac arteries without significant stenosis or aneurysm. Proximal Outflow: Mild atherosclerosis of the left common femoral artery without significant stenosis. Veins: Intraluminal thrombus of the IVC. Patent portal and splenic veins. Review of the MIP images confirms the above findings. NON-VASCULAR Lower chest: The main pulmonary artery is nondilated measuring up to 2.4 cm. No large central pulmonary embolus of the included pulmonary arterial system.  Heart size is enlarged without pericardial effusion or thickening. Bibasilar dependent atelectasis is noted. Hepatobiliary: No focal liver abnormality is seen. No gallstones, gallbladder wall thickening, or biliary dilatation. Pancreas: Unremarkable. No pancreatic ductal dilatation or surrounding inflammatory changes. Spleen: Previously noted splenic infarcts are no longer apparent. No mass or enhancing lesions. No new area of infarction. Adrenals/Urinary Tract: Normal bilateral adrenal glands. Small cortical cysts of both kidneys with areas of cortical scarring in the lower poles. No obstructive uropathy. Normal urinary bladder. Stomach/Bowel: The stomach is decompressed in appearance and there is no gastric fold thickening or mural thickening noted. The duodenum, ligament of Treitz in small bowel are nonacute. Moderate stool retention within the colon is identified with extensive circular muscle hypertrophy and sigmoid diverticulosis along the sigmoid colon. Minimal distal descending colonic diverticulosis is also noted. Appendix is not discretely identified. Lymphatic: No pathologically enlarged lymph nodes. Reproductive: Status post hysterectomy. No adnexal masses. Other: No pneumoperitoneum.  No abdominopelvic ascites. Musculoskeletal: Ankylosis of L4-5 cerclage wire overlying the posterior lower spinal elements. Anterolisthesis of L4 on L5 is noted. Mild Schmorl's node at L2. No aggressive osseous lesions. IMPRESSION: VASCULAR 1. Ectasia of the included ascending thoracic aorta. 2. Nonaneurysmal atherosclerotic abdominal aorta with patent branch vessels. No significant stenosis or occlusion identified. NON-VASCULAR 1. Apparent resolution of previously noted splenic infarcts. No new infarct identified. 2. No bowel obstruction or inflammation. Extensive sigmoid diverticulosis without acute diverticulitis. Electronically Signed   By: Ashley Royalty M.D.   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 Fib  Chronic hyponatremia; worsening     IV fluids normal saline, gentle hydration  Hypertension; controlled    Continue with atenolol and losartan  COPD/emphysema     Continue with DuoNeb's  History of anxiety     Continue with Klonopin  Hyperlipidemia    Continue with Pravachol  DVT Prophylaxis Eliquis  AM Labs Ordered, also please review Full Orders  Code Status full , discussed CODE STATUS in front of her sisters and husband.  They want full code for now.  Her daughter is a power of attorney and she has a living will.  May need to be addressed later on.  Patient has a poor prognosis.  Unable to review the living well at this time  Disposition Plan: Home  Time spent in minutes : 42 min  Condition GUARDED   @SIGNATURE @

## 2018-04-08 NOTE — Progress Notes (Signed)
Pharmacy Antibiotic Note  Lilinoe Acklin Lamoreaux is a 84 y.o. female admitted on 04/08/2018 with intra-abdominal infection.  Pharmacy has been consulted for Cipro dosing - currently with Cipro 400mg  IV x 1 dose in progress in the ED. Also with Flagyl ordered per MD. SCr 0.84 on admit.  Plan: Cipro 400mg  IV q12h Flagyl 500mg  IV q8h Monitor clinical progress, c/s, renal function F/u de-escalation plan/LOT   Height: 5\' 4"  (162.6 cm) Weight: 118 lb (53.5 kg) IBW/kg (Calculated) : 54.7  Temp (24hrs), Avg:98.2 F (36.8 C), Min:98.2 F (36.8 C), Max:98.2 F (36.8 C)  Recent Labs  Lab 04/02/18 1309 04/02/18 1809 04/08/18 0836  WBC 8.7  --  10.8*  CREATININE 0.84  --  0.84  LATICACIDVEN  --  1.94*  --     Estimated Creatinine Clearance: 43.6 mL/min (by C-G formula based on SCr of 0.84 mg/dL).    Allergies  Allergen Reactions  . Latex Hives    TONGUE HAD BLISTERS WHEN HAD DENTAL SURGERY  . Amoxicillin Nausea And Vomiting  . Aspirin Other (See Comments)    Gi upset  . Barbiturates Other (See Comments)    Unknown reaction  . Celebrex [Celecoxib] Other (See Comments)    Unknown reaction  . Codeine Itching  . Evista [Raloxifene] Other (See Comments)    Unknown reaction  . Lipitor [Atorvastatin] Other (See Comments)    Unknown reaction  . Morphine And Related Other (See Comments)    Unknown reaction  . Oruvail [Ketoprofen] Other (See Comments)    Unknown reaction  . Pantoprazole Diarrhea  . Paraffin Other (See Comments)    Unknown reaction  . Prochlorperazine Other (See Comments)    Uncontrolled shaking  . Proloprim [Trimethoprim] Other (See Comments)    Unknown reaction  . Venlafaxine Nausea Only  . Vibra-Tab [Doxycycline] Other (See Comments)    Unknown reaction  . Vioxx [Rofecoxib] Other (See Comments)    edema  . Betadine [Povidone Iodine] Rash  . Caffeine Palpitations   Elicia Lamp, PharmD, BCPS Clinical Pharmacist Clinical phone [phone] Please check AMION for all  [place] contact numbers 04/08/2018 2:22 PM

## 2018-04-08 NOTE — ED Provider Notes (Signed)
[facility] EMERGENCY DEPARTMENT Provider Note   CSN: [id number] Arrival date & time: 04/08/18  4270     History   Chief Complaint Chief Complaint  Patient presents with  . Abdominal Pain    HPI Debra White is a 84 y.o. female.  HPI   Debra White is a 84 y.o. female, with a history of right renal infarct, splenic infarct, fibromyalgia, HTN, hyperlipidemia, and GERD, presenting to the ED with abdominal pain beginning yesterday, but increasing in severity last night. Pain is constant, lower abdomen, bilateral, sharp, radiating to the back, severe, states it's similar to pain that Debra White experienced when the renal/splenic infarcts were found.  Accompanied by nausea.  Debra White has taken tramadol and Tylenol without improvement. Last bowel movement was this morning and was normal. Right renal and splenic infarcts noted by CT on 9/28.  Patient was placed on Eliquis, with which Debra White has been compliant.   Denies fever/chills, vomiting, diarrhea, constipation, hematochezia/melena, urinary symptoms, chest pain, shortness of breath, or any other complaints.    Past Medical History:  Diagnosis Date  . Anxiety   . Arthritis   . Blood transfusion   . C. difficile colitis   . Candida esophagitis ([place]) 2013   EGD  . Cataract   . Chronic kidney disease    STONES  . Diverticulitis 04/05/2017  . Diverticulosis of colon (without mention of hemorrhage) 2007   Colonoscopy  . Dysrhythmia    RX  . Emphysema of lung ([place])   . Family history of colon cancer    sister  . Fibromyalgia   . Fracture, zygoma closed ([place]) 07/13/2011  . GERD (gastroesophageal reflux disease)   . Headache(784.0)   . Hyperlipemia   . Hypertension   . Kidney stones   . Ptosis, bilateral   . Renal infarct ([place])   . Splenic infarct     Patient Active Problem List   Diagnosis Date Noted  . Diverticulitis 04/08/2018  . Coronary atherosclerosis due to lipid rich plaque 03/26/2018  . Splenic  infarct 03/22/2018  . Renal infarct ([place]) 03/22/2018  . Chronic hyponatremia 03/22/2018  . Chronic hyperkalemia 03/22/2018  . Right sided numbness 03/22/2018  . Hepatic steatosis 11/17/2017  . Aortic atherosclerosis ([place]) 08/08/2017  . COPD (chronic obstructive pulmonary disease) with emphysema ([place]) 12/21/2015  . 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 Osteoporosis 12/21/2015  . Mild malnutrition ([place]) 05/11/2015  . Encounter for Medicare annual wellness exam 04/08/2015  . Medication management 01/27/2014  . 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 Vitamin D deficiency 07/08/2013  . Hypertension   . Hyperlipidemia   . GERD   . Headache, post-traumatic, chronic 09/20/2011    Past Surgical History:  Procedure Laterality Date  . ABDOMINAL HYSTERECTOMY    . BACK SURGERY     X2  . BACK SURGERY    . BREAST EXCISIONAL BIOPSY    . CERVICAL DISC SURGERY    . COLONOSCOPY    . FACIAL FRACTURE SURGERY    . HAND SURGERY     BIL   . ORIF TRIPOD FRACTURE  07/13/2011   Procedure: OPEN REDUCTION INTERNAL FIXATION (ORIF) TRIPOD FRACTURE;  Surgeon: Tyson Alias, MD;  Location: [facility];  Service: ENT;  Laterality: Right;  ORIF RIGHT ZYGOMA, ORBITAL FLOOR EXPLORATION WITH FROST STITCH (TEMPORARY TARSORRHAPHY)  . PTOSIS REPAIR Bilateral 11/22/2016   Procedure: INTERNAL PTOSIS REPAIR;  Surgeon: Clista Bernhardt, MD;  Location: [facility];  Service: Ophthalmology;  Laterality: Bilateral;  .  SHOULDER ARTHROSCOPY W/ ROTATOR CUFF REPAIR     LFT     OB History   None      Home Medications    Prior to Admission medications   Medication Sig Start Date End Date Taking? Authorizing Provider  acetaminophen (TYLENOL) 500 MG tablet Take 500-1,000 mg by mouth every 6 (six) hours as needed for headache.   Yes [provider]  apixaban (ELIQUIS) 2.5 MG TABS tablet Take 1 tablet (2.5 mg total) by mouth 2 (two) times daily. 03/24/18  Yes Vann, Jessica U, DO  atenolol (TENORMIN) 100 MG tablet TAKE 1 TABLET BY  MOUTH DAILY FOR BLOOD PRESSURE Patient taking differently: Take 50 mg by mouth 2 (two) times daily.  03/26/18  Yes Vicie Mutters, PA-C  Cholecalciferol (VITAMIN D) 2000 units tablet Take 4,000 Units by mouth daily.    Yes [provider]  ciprofloxacin (CIPRO) 250 MG tablet Take 1 tablet (250 mg total) by mouth 2 (two) times daily for 10 days. 04/04/18 04/14/18 Yes Unk Pinto, MD  clonazePAM (KLONOPIN) 1 MG tablet TAKE 1/2 TO 1 TABLET BY MOUTH 2-3 TIMES DAILY ONLY IF NEEDED FOR ANXIETY ATTACK AND LIMIT TO 5 DAYS PER WEEK TO AVOID ADDICTION Patient taking differently: Take 0.5 mg by mouth 2 (two) times daily.  03/19/18  Yes Unk Pinto, MD  diclofenac sodium (VOLTAREN) 1 % GEL Apply 2 g topically 4 (four) times daily. 03/24/18  Yes Vann, Jessica U, DO  dicyclomine (BENTYL) 10 MG capsule Take 10 mg by mouth daily. 03/10/18  Yes [provider]  famotidine (PEPCID) 40 MG tablet Take 1 tablet (40 mg total) by mouth daily. 03/28/18  Yes Unk Pinto, MD  fluconazole (DIFLUCAN) 100 MG tablet Take 1 tablet daily for yeast infection 04/04/18  Yes Unk Pinto, MD  gabapentin (NEURONTIN) 600 MG tablet Take 1/2 to 1 tablet s 3 to 4 x / day as needed for Facial Pain Patient taking differently: Take 300-600 mg by mouth See admin instructions. Take 300 mg in the morning, 300 mg at 1400 and 600 mg at bedtime for facial pain 11/14/17 11/15/18 Yes Unk Pinto, MD  hyoscyamine (LEVSIN SL) 0.125 MG SL tablet Dissolve 1 tablet under tongue every 4 hours as needed for Nausea, Cramping , bloating or Diarrhea Patient taking differently: Place 0.125 mg under the tongue every 4 (four) hours as needed for cramping (nausea, bloating, diarrhea).  03/07/18  Yes Unk Pinto, MD  losartan (COZAAR) 100 MG tablet TAKE 1/2 TABLET BY MOUTH DAILY Patient taking differently: Take 50 mg by mouth every evening.  06/07/17  Yes Unk Pinto, MD  pravastatin (PRAVACHOL) 40 MG tablet Take 0.5  tablets (20 mg total) by mouth every evening. 03/24/18  Yes Vann, Jessica U, DO  ranitidine (ZANTAC) 300 MG tablet Take 300 mg by mouth at bedtime.   Yes [provider]  traMADol (ULTRAM) 50 MG tablet TAKE 1 TABLET BY MOUTH 4 TIMES DAILY AS NEEDED FOR PAIN Patient taking differently: Take 50 mg by mouth 4 (four) times daily as needed (pain).  08/12/17  Yes Vicie Mutters, PA-C  omeprazole (PRILOSEC OTC) 20 MG tablet Take 20 mg by mouth daily.  09/01/11  [provider]    Family History Family History  Problem Relation Age of Onset  . Heart disease Mother   . Heart disease Father   . Breast cancer Sister 50  . Colon cancer Sister   . Cancer Sister        breast  .  Cancer Sister        colon  . Cancer Sister        colon  . Hypertension Daughter   . Hyperlipidemia Daughter     Social History Social History   Tobacco Use  . Smoking status: Never Smoker  . Smokeless tobacco: Never Used  Substance Use Topics  . Alcohol use: No  . Drug use: No     Allergies   Latex; Amoxicillin; Aspirin; Barbiturates; Celebrex [celecoxib]; Codeine; Evista [raloxifene]; Lipitor [atorvastatin]; Morphine and related; Oruvail [ketoprofen]; Pantoprazole; Paraffin; Prochlorperazine; Proloprim [trimethoprim]; Venlafaxine; Vibra-tab [doxycycline]; Vioxx [rofecoxib]; Betadine [povidone iodine]; and Caffeine   Review of Systems Review of Systems  Constitutional: Negative for chills, diaphoresis and fever.  Respiratory: Negative for shortness of breath.   Cardiovascular: Negative for chest pain.  Gastrointestinal: Positive for abdominal pain and nausea. Negative for blood in stool, constipation, diarrhea and vomiting.  Genitourinary: Negative for dysuria, hematuria and vaginal bleeding.  Neurological: Negative for weakness.  All other systems reviewed and are negative.    Physical Exam Updated Vital Signs BP (!) 143/65   Pulse 67   Temp 98.2 F (36.8 C) (Oral)   Resp 19   Ht  5\' 4"  (1.626 m)   Wt 53.5 kg   BMI 20.25 kg/m   Physical Exam  Constitutional: Debra White appears well-developed and well-nourished. No distress.  HENT:  Head: Normocephalic and atraumatic.  Eyes: Conjunctivae are normal.  Neck: Neck supple.  Cardiovascular: Normal rate, regular rhythm, normal heart sounds and intact distal pulses.  Pulmonary/Chest: Effort normal and breath sounds normal. No respiratory distress.  Abdominal: Soft. There is tenderness. There is guarding.    Patient has tenderness across the lower abdomen, but tenderness seems to be most severe in the left lower quadrant.  Musculoskeletal: Debra White exhibits no edema.  Lymphadenopathy:    Debra White has no cervical adenopathy.  Neurological: Debra White is alert.  Skin: Skin is warm and dry. Debra White is not diaphoretic.  Psychiatric: Debra White has a normal mood and affect. Her behavior is normal.  Nursing note and vitals reviewed.    ED Treatments / Results  Labs (all labs ordered are listed, but only abnormal results are displayed) Labs Reviewed  URINALYSIS, ROUTINE W REFLEX MICROSCOPIC - Abnormal; Notable for the following components:      Result Value   Color, Urine COLORLESS (*)    Specific Gravity, Urine 1.003 (*)    Glucose, UA 50 (*)    Hgb urine dipstick SMALL (*)    All other components within normal limits  COMPREHENSIVE METABOLIC PANEL - Abnormal; Notable for the following components:   Sodium 128 (*)    Chloride 93 (*)    Glucose, Bld 142 (*)    BUN 6 (*)    All other components within normal limits  CBC WITH DIFFERENTIAL/PLATELET - Abnormal; Notable for the following components:   WBC 10.8 (*)    RBC 3.68 (*)    Hemoglobin 11.3 (*)    HCT 35.0 (*)    Neutro Abs 8.6 (*)    Monocytes Absolute 1.2 (*)    All other components within normal limits  LIPASE, BLOOD    EKG None  Radiology Ct Abdomen Pelvis W Contrast  Result Date: 04/08/2018 CLINICAL DATA:  84 year old female on blood thinner for renal blood clot. Abdominal  pain. Prior hysterectomy. Subsequent encounter. EXAM: CT ABDOMEN AND PELVIS WITH CONTRAST TECHNIQUE: Multidetector CT imaging of the abdomen and pelvis was performed using the standard protocol following bolus administration  of intravenous contrast. CONTRAST:  182mL OMNIPAQUE IOHEXOL 300 MG/ML  SOLN COMPARISON:  04/02/2018 CT. FINDINGS: Lower chest: Minimal basilar atelectasis. Mild cardiomegaly. Coronary artery calcifications. Slightly dense breast parenchyma. Hepatobiliary: Enlarged liver spanning over 20.2 cm. No worrisome hepatic lesion. No calcified gallstones. No CT evidence of gallbladder inflammation. Pancreas: No worrisome pancreatic mass or inflammation. Spleen: Remote splenic infarcts. Adrenals/Urinary Tract: No obstructing stone or hydronephrosis. Result of infarct inferior right kidney. Subcentimeter low-density structures left kidney too small to characterize although statistically likely cysts. No adrenal lesion. Noncontrast filled imaging of the urinary bladder unremarkable. Stomach/Bowel: Interval development of sigmoid colon diverticulitis. Inflammatory process extends towards the left without drainable abscess or free air. Significant baseline diverticula and muscular hypertrophy. Vascular/Lymphatic: Atherosclerotic changes abdominal aorta and aortic branch vessels. No abdominal aortic aneurysm or large arterial vessel occlusion. Portal vein with peripheral hypodensity on early phase imaging fills in on delayed imaging and therefore probably related to mixing of opacified and unopacified blood rather than thrombus. Scattered normal size lymph nodes. Reproductive: Post hysterectomy.  No worrisome adnexal mass. Other: No free intraperitoneal air or bowel containing hernia. No retroperitoneal hematoma. Musculoskeletal: Fusion lower thoracic spine and lower lumbar/upper sacrum. Schmorl's node deformity superior endplate L2. No acute osseous abnormality. IMPRESSION: 1. Interval development of sigmoid  colon diverticulitis. Inflammatory process extends towards the left without drainable abscess or free air. Significant baseline diverticula and muscular hypertrophy. 2. Remote splenic and right renal infarct.  No acute infarct noted. 3. Aortic Atherosclerosis (ICD10-I70.0). Coronary artery calcifications. 4. Enlarged liver Electronically Signed   By: Genia Del M.D.   On: 04/08/2018 12:49    Procedures Procedures (including critical care time)  Medications Ordered in ED Medications  traMADol (ULTRAM) tablet 50 mg (has no administration in time range)  pravastatin (PRAVACHOL) tablet 20 mg (has no administration in time range)  dicyclomine (BENTYL) capsule 10 mg (has no administration in time range)  famotidine (PEPCID) tablet 40 mg (has no administration in time range)  hyoscyamine (LEVSIN SL) SL tablet 0.125 mg (has no administration in time range)  apixaban (ELIQUIS) tablet 2.5 mg (has no administration in time range)  clonazePAM (KLONOPIN) tablet 0.5 mg (has no administration in time range)  gabapentin (NEURONTIN) capsule 300 mg (has no administration in time range)  cholecalciferol (VITAMIN D) tablet 4,000 Units (has no administration in time range)  0.9 %  sodium chloride infusion (has no administration in time range)  ondansetron (ZOFRAN) tablet 4 mg (has no administration in time range)    Or  ondansetron (ZOFRAN) injection 4 mg (has no administration in time range)  metroNIDAZOLE (FLAGYL) IVPB 500 mg (has no administration in time range)  HYDROcodone-acetaminophen (HYCET) 7.5-325 mg/15 ml solution 10 mL (has no administration in time range)  morphine 2 MG/ML injection 1 mg (has no administration in time range)  gabapentin (NEURONTIN) capsule 600 mg (has no administration in time range)  ciprofloxacin (CIPRO) IVPB 400 mg (has no administration in time range)  atenolol (TENORMIN) tablet 50 mg (has no administration in time range)  fentaNYL (SUBLIMAZE) injection 50 mcg (50 mcg  Intravenous Given 04/08/18 0910)  sodium chloride 0.9 % bolus 500 mL (0 mLs Intravenous Stopped 04/08/18 1423)  iohexol (OMNIPAQUE) 300 MG/ML solution 100 mL (100 mLs Intravenous Contrast Given 04/08/18 1208)  ciprofloxacin (CIPRO) IVPB 400 mg (0 mg Intravenous Stopped 04/08/18 1520)  morphine 2 MG/ML injection 2 mg (2 mg Intravenous Given 04/08/18 1349)  ondansetron (ZOFRAN) injection 4 mg (4 mg Intravenous Given 04/08/18 1349)  Initial Impression / Assessment and Plan / ED Course  I have reviewed the triage vital signs and the nursing notes.  Pertinent labs & imaging results that were available during my care of the patient were reviewed by me and considered in my medical decision making (see chart for details).  Clinical Course as of Apr 09 1551  Tue Apr 08, 2018  1018 Value of 12.2 six days ago, but 10.1 two weeks ago.  Hemoglobin(!): 11.3 [SJ]  1022 Patient states her pain is well controlled.   [SJ]  31 Spoke with Dr. Laren Everts, hospitalist.  Agrees to admit the patient.   [SJ]    Clinical Course User Index [SJ] Sincere Berlanga C, PA-C    Patient presents with onset of lower abdominal pain.  Tender in the left lower quadrant.  Very mild leukocytosis.  Afebrile and nontoxic-appearing.  Evidence of diverticulitis on CT.  Antibiotic therapy initiated.  Patient also has a hyponatremia.  Debra White does not appear to be symptomatic to it at this time.  Admission for further management of these issues.  Findings and plan of care discussed with Lajean Saver, MD. Dr. Ashok Cordia personally evaluated and examined this patient.  Vitals:   04/08/18 1330 04/08/18 1345 04/08/18 1415 04/08/18 1506  BP: (!) 142/68 (!) 153/88 (!) 143/63 (!) 132/56  Pulse:    69  Resp: 14 16 (!) 21 16  Temp:    98.3 F (36.8 C)  TempSrc:    Oral  SpO2:  95% 96% 99%  Weight:      Height:          Final Clinical Impressions(s) / ED Diagnoses   Final diagnoses:  Lower abdominal pain  Hyponatremia  Diverticulitis     ED Discharge Orders    None       Layla Maw 04/08/18 1554    Lajean Saver, MD 04/14/18 2319

## 2018-04-08 NOTE — ED Notes (Addendum)
Patient transported to CT 

## 2018-04-08 NOTE — ED Notes (Signed)
Pt given ice chips

## 2018-04-08 NOTE — ED Notes (Signed)
Pt ambulated self efficiently to restroom. Pt returned safely to bedside. 

## 2018-04-09 ENCOUNTER — Ambulatory Visit: Payer: Self-pay | Admitting: Physician Assistant

## 2018-04-09 DIAGNOSIS — K5792 Diverticulitis of intestine, part unspecified, without perforation or abscess without bleeding: Secondary | ICD-10-CM

## 2018-04-09 DIAGNOSIS — I10 Essential (primary) hypertension: Secondary | ICD-10-CM

## 2018-04-09 DIAGNOSIS — J449 Chronic obstructive pulmonary disease, unspecified: Secondary | ICD-10-CM

## 2018-04-09 LAB — BASIC METABOLIC PANEL
Anion gap: 9 (ref 5–15)
BUN: <5 mg/dL — ABNORMAL LOW (ref 8–23)
CHLORIDE: 95 mmol/L — AB (ref 98–111)
CO2: 27 mmol/L (ref 22–32)
CREATININE: 0.99 mg/dL (ref 0.44–1.00)
Calcium: 8.9 mg/dL (ref 8.9–10.3)
GFR, EST AFRICAN AMERICAN: 60 mL/min — AB (ref >60)
GFR, EST NON AFRICAN AMERICAN: 52 mL/min — AB (ref >60)
Glucose, Bld: 140 mg/dL — ABNORMAL HIGH (ref 70–99)
POTASSIUM: 3.8 mmol/L (ref 3.5–5.1)
SODIUM: 131 mmol/L — AB (ref 135–145)

## 2018-04-09 LAB — CBC
HEMATOCRIT: 33.5 % — AB (ref 36.0–46.0)
Hemoglobin: 10.7 g/dL — ABNORMAL LOW (ref 12.0–15.0)
MCH: 30.5 pg (ref 26.0–34.0)
MCHC: 31.9 g/dL (ref 30.0–36.0)
MCV: 95.4 fL (ref 80.0–100.0)
NRBC: 0 % (ref 0.0–0.2)
Platelets: 249 10*3/uL (ref 150–400)
RBC: 3.51 MIL/uL — ABNORMAL LOW (ref 3.87–5.11)
RDW: 12.5 % (ref 11.5–15.5)
WBC: 9.9 10*3/uL (ref 4.0–10.5)

## 2018-04-09 MED ORDER — APIXABAN 5 MG PO TABS
5.0000 mg | ORAL_TABLET | Freq: Two times a day (BID) | ORAL | Status: DC
  Administered 2018-04-09 – 2018-04-12 (×6): 5 mg via ORAL
  Filled 2018-04-09 (×6): qty 1

## 2018-04-09 MED ORDER — IPRATROPIUM-ALBUTEROL 0.5-2.5 (3) MG/3ML IN SOLN: 3.0000 mL | Freq: Four times a day (QID) | RESPIRATORY_TRACT | Status: DC | PRN

## 2018-04-09 NOTE — Progress Notes (Signed)
PROGRESS NOTE  Debra White TGG:[id number] DOB: 23-Jun-1934 DOA: 04/08/2018 PCP: Unk Pinto, MD  HPI/Recap of past 24 hours: HPI from [name]  is a 84 y.o. 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  Patient reports chills but no documented fever.  No history of diarrhea.  Patient was recently  discharged home with a monitor to rule out paroxysmal A. fib which she could not wear due to allergies.  Patient has a long list of allergies and cannot take amoxicillin.  In the ER, CT abdomen showed diverticulitis and the patient was started on IV antibiotics Cipro and Flagyl. Pt admitted for further management.   Today, pt reported nausea with an episode of vomiting shortly after CLD. Still with LLQ abdominal pain. Noted low grade fever, denies chest pain, SOB, diarrhea. Pt advised to hold off on CLD until able to tolerate better  Assessment/Plan: Active Problems:   Diverticulitis  Acute diverticulitis Currently with low-grade temp 100.2, with resolved leukocytosis CT abdomen showed interval development of sigmoid colon diverticulitis. Inflammatory process extends towards the left without drainable abscess or free air Continue IV Flagyl, Cipro Continue IV fluids Clear liquid diet as tolerated Antiemetics, pain management Monitor closely  Chronic hyponatremia Currently improved Continue IV fluids Daily BMP  History of splenic and renal infarcts of unknown origin Continue with Eliquis  Hypertension BP currently soft Held home atenolol, losartan  COPD Stable Continue with DuoNeb's  Hyperlipidemia Held statins for now  History of anxiety Continue Klonopin    Code Status: Full  Family Communication: Husband, and 2 sisters at bedside  Disposition Plan: To be determined once significant  improvement   Consultants:  None  Procedures:  None  Antimicrobials:  Flagyl  Ciprofloxacin  DVT prophylaxis: Eliquis   Objective: Vitals:   04/08/18 2043 04/08/18 2045 04/09/18 0146 04/09/18 0524  BP: (!) 109/46   (!) 98/50  Pulse: 76   80  Resp: 16     Temp: 99 F (37.2 C)   100.2 F (37.9 C)  TempSrc: Oral   Oral  SpO2: 96% 97% 95% 96%  Weight:      Height:        Intake/Output Summary (Last 24 hours) at 04/09/2018 1331 Last data filed at 04/09/2018 0916 Gross per 24 hour  Intake 1829.99 ml  Output 300 ml  Net 1529.99 ml   Filed Weights   04/08/18 0823  Weight: 53.5 kg    Exam:   General: NAD  Cardiovascular: S1, S2 present  Respiratory: CTA B  Abdomen: Soft, TTP at LLQ, nondistended, BS present  Musculoskeletal: No pedal edema bilaterally  Skin: Normal  Psychiatry: Normal mood   Data Reviewed: CBC: Recent Labs  Lab 04/08/18 0836 04/09/18 0231  WBC 10.8* 9.9  NEUTROABS 8.6*  --   HGB 11.3* 10.7*  HCT 35.0* 33.5*  MCV 95.1 95.4  PLT 243 703   Basic Metabolic Panel: Recent Labs  Lab 04/08/18 0836 04/09/18 0231  NA 128* 131*  K 4.1 3.8  CL 93* 95*  CO2 27 27  GLUCOSE 142* 140*  BUN 6* <5*  CREATININE 0.84 0.99  CALCIUM 8.9 8.9   GFR: Estimated Creatinine Clearance: 37 mL/min (by C-G formula based on SCr of 0.99 mg/dL). Liver Function Tests: Recent Labs  Lab 04/08/18 0836  AST 24  ALT 14  ALKPHOS 62  BILITOT  0.6  PROT 6.8  ALBUMIN 3.7   Recent Labs  Lab 04/08/18 0836  LIPASE 37   No results for input(s): AMMONIA in the last 168 hours. Coagulation Profile: No results for input(s): INR, PROTIME in the last 168 hours. Cardiac Enzymes: No results for input(s): CKTOTAL, CKMB, CKMBINDEX, TROPONINI in the last 168 hours. BNP (last 3 results) No results for input(s): PROBNP in the last 8760 hours. HbA1C: No results for input(s): HGBA1C in the last 72 hours. CBG: No results for input(s): GLUCAP in the last  168 hours. Lipid Profile: No results for input(s): CHOL, HDL, LDLCALC, TRIG, CHOLHDL, LDLDIRECT in the last 72 hours. Thyroid Function Tests: No results for input(s): TSH, T4TOTAL, FREET4, T3FREE, THYROIDAB in the last 72 hours. Anemia Panel: No results for input(s): VITAMINB12, FOLATE, FERRITIN, TIBC, IRON, RETICCTPCT in the last 72 hours. Urine analysis:    Component Value Date/Time   COLORURINE COLORLESS (A) 04/08/2018 0834   APPEARANCEUR CLEAR 04/08/2018 0834   LABSPEC 1.003 (L) 04/08/2018 0834   PHURINE 7.0 04/08/2018 0834   GLUCOSEU 50 (A) 04/08/2018 0834   HGBUR SMALL (A) 04/08/2018 0834   BILIRUBINUR NEGATIVE 04/08/2018 0834   KETONESUR NEGATIVE 04/08/2018 0834   PROTEINUR NEGATIVE 04/08/2018 0834   UROBILINOGEN 0.2 04/13/2015 1700   NITRITE NEGATIVE 04/08/2018 0834   LEUKOCYTESUR NEGATIVE 04/08/2018 0834   Sepsis Labs: @LABRCNTIP (procalcitonin:4,lacticidven:4)  ) Recent Results (from the past 240 hour(s))  Urine culture     Status: Abnormal   Collection Time: 04/02/18 11:11 PM  Result Value Ref Range Status   Specimen Description URINE, CLEAN CATCH  Final   Special Requests   Final    Normal Performed at [facility], [place] [address], [place], [place] [address]    Culture MULTIPLE SPECIES PRESENT, SUGGEST RECOLLECTION (A)  Final   Report Status 04/04/2018 FINAL  Final  Urine Culture     Status: None   Collection Time: 04/04/18 10:23 AM  Result Value Ref Range Status   MICRO NUMBER: [id number]  Final   SPECIMEN QUALITY: ADEQUATE  Final   Sample Source URINE  Final   STATUS: FINAL  Final   Result:   Final    Three or more organisms present, each greater than 10,000 cu/mL. May represent normal flora contamination from external genitalia. No further testing is required.      Studies: No results found.  Scheduled Meds: . apixaban  2.5 mg Oral BID  . atenolol  50 mg Oral BID  . clonazePAM  0.5 mg Oral BID  . dicyclomine  10 mg Oral Daily  . famotidine   40 mg Oral Daily  . gabapentin  300 mg Oral BID  . gabapentin  600 mg Oral QHS    Continuous Infusions: . sodium chloride 50 mL/hr at 04/09/18 0400  . ciprofloxacin Stopped (04/09/18 0255)  . metronidazole 500 mg (04/09/18 0556)     LOS: 1 day     Alma Friendly, MD [facility]   If 7PM-7AM, please contact night-coverage [email or web address] 04/09/2018, 1:31 PM

## 2018-04-10 DIAGNOSIS — E871 Hypo-osmolality and hyponatremia: Secondary | ICD-10-CM

## 2018-04-10 LAB — BASIC METABOLIC PANEL
Anion gap: 6 (ref 5–15)
BUN: <5 mg/dL — ABNORMAL LOW (ref 8–23)
CALCIUM: 8.3 mg/dL — AB (ref 8.9–10.3)
CO2: 27 mmol/L (ref 22–32)
CREATININE: 0.86 mg/dL (ref 0.44–1.00)
Chloride: 98 mmol/L (ref 98–111)
GFR calc Af Amer: >60 mL/min (ref >60)
GFR calc non Af Amer: >60 mL/min (ref >60)
Glucose, Bld: 104 mg/dL — ABNORMAL HIGH (ref 70–99)
Potassium: 3.7 mmol/L (ref 3.5–5.1)
Sodium: 131 mmol/L — ABNORMAL LOW (ref 135–145)

## 2018-04-10 LAB — CBC WITH DIFFERENTIAL/PLATELET
Abs Immature Granulocytes: 0.02 10*3/uL (ref 0.00–0.07)
Basophils Absolute: 0 10*3/uL (ref 0.0–0.1)
Basophils Relative: 0 %
EOS ABS: 0.1 10*3/uL (ref 0.0–0.5)
EOS PCT: 2 %
HEMATOCRIT: 31.9 % — AB (ref 36.0–46.0)
Hemoglobin: 10.1 g/dL — ABNORMAL LOW (ref 12.0–15.0)
Immature Granulocytes: 0 %
LYMPHS ABS: 1.3 10*3/uL (ref 0.7–4.0)
Lymphocytes Relative: 16 %
MCH: 30.1 pg (ref 26.0–34.0)
MCHC: 31.7 g/dL (ref 30.0–36.0)
MCV: 94.9 fL (ref 80.0–100.0)
MONOS PCT: 15 %
Monocytes Absolute: 1.2 10*3/uL — ABNORMAL HIGH (ref 0.1–1.0)
Neutro Abs: 5.6 10*3/uL (ref 1.7–7.7)
Neutrophils Relative %: 67 %
Platelets: 239 10*3/uL (ref 150–400)
RBC: 3.36 MIL/uL — ABNORMAL LOW (ref 3.87–5.11)
RDW: 12.1 % (ref 11.5–15.5)
WBC: 8.3 10*3/uL (ref 4.0–10.5)
nRBC: 0 % (ref 0.0–0.2)

## 2018-04-10 MED ORDER — SODIUM CHLORIDE 0.9 % IV SOLN
INTRAVENOUS | Status: DC | PRN
  Administered 2018-04-10: 250 mL via INTRAVENOUS

## 2018-04-10 NOTE — Progress Notes (Signed)
PROGRESS NOTE  Debra White BOF:[id number] DOB: Jun 11, 1934 DOA: 04/08/2018 PCP: Unk Pinto, MD  HPI/Recap of past 24 hours: HPI from Dr Debra White  is a 84 y.o. 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  Patient reports chills but no documented fever.  No history of diarrhea.  Patient was recently  discharged home with a monitor to rule out paroxysmal A. fib which she could not wear due to allergies.  Patient has a long list of allergies and cannot take amoxicillin.  In the ER, CT abdomen showed diverticulitis and the patient was started on IV antibiotics Cipro and Flagyl. Pt admitted for further management.   Today, pt reported feeling better, abdominal pain improving. Wants to try an advanced diet. Denies any new complaints.    Assessment/Plan: Active Problems:   Diverticulitis  Acute diverticulitis Currently afebrile, with resolved leukocytosis CT abdomen showed interval development of sigmoid colon diverticulitis. 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 Continue with Eliquis  Hypertension BP currently soft Held home atenolol, losartan  COPD Stable Continue with DuoNeb's  Hyperlipidemia Held statins for now  History of anxiety Continue Klonopin    Code Status: Full  Family Communication: Husband, and 2 sisters at bedside  Disposition Plan: Plan for 04/11/18   Consultants:  None  Procedures:  None  Antimicrobials:  Flagyl  Ciprofloxacin  DVT prophylaxis: Eliquis   Objective: Vitals:   04/09/18 1406 04/09/18 2025 04/10/18 0542 04/10/18 1451  BP: (!) 122/58  136/65 (!) 125/54 (!) 150/67  Pulse: 69 78 76 84  Resp: 18 18 16    Temp: 98.2 F (36.8 C) 98.7 F (37.1 C) 98.6 F (37 C) (!) 97.4 F (36.3 C)  TempSrc: Oral Oral Oral Oral  SpO2: 98% 97% 95% 100%  Weight:      Height:        Intake/Output Summary (Last 24 hours) at 04/10/2018 1804 Last data filed at 04/10/2018 1136 Gross per 24 hour  Intake 2041.62 ml  Output 0 ml  Net 2041.62 ml   Filed Weights   04/08/18 0823  Weight: 53.5 kg    Exam:   General: NAD  Cardiovascular: S1, S2 present  Respiratory: CTA B  Abdomen: Soft, mildly TTP at LLQ, nondistended, BS present  Musculoskeletal: No pedal edema bilaterally  Skin: Normal  Psychiatry: Normal mood   Data Reviewed: CBC: Recent Labs  Lab 04/08/18 0836 04/09/18 0231 04/10/18 0301  WBC 10.8* 9.9 8.3  NEUTROABS 8.6*  --  5.6  HGB 11.3* 10.7* 10.1*  HCT 35.0* 33.5* 31.9*  MCV 95.1 95.4 94.9  PLT 243 249 778   Basic Metabolic Panel: Recent Labs  Lab 04/08/18 0836 04/09/18 0231 04/10/18 0301  NA 128* 131* 131*  K 4.1 3.8 3.7  CL 93* 95* 98  CO2 27 27 27   GLUCOSE 142* 140* 104*  BUN 6* <5* <5*  CREATININE 0.84 0.99 0.86  CALCIUM 8.9 8.9 8.3*   GFR: Estimated Creatinine Clearance: 42.6 mL/min (by C-G formula based on SCr of 0.86 mg/dL). Liver Function Tests: Recent Labs  Lab 04/08/18 0836  AST 24  ALT 14  ALKPHOS 62  BILITOT 0.6  PROT 6.8  ALBUMIN 3.7  Recent Labs  Lab 04/08/18 0836  LIPASE 37   No results for input(s): AMMONIA in the last 168 hours. Coagulation Profile: No results for input(s): INR, PROTIME in the last 168 hours. Cardiac Enzymes: No results for input(s): CKTOTAL, CKMB, CKMBINDEX, TROPONINI in the last 168 hours. BNP (last 3 results) No results for input(s): PROBNP in the last 8760 hours. HbA1C: No results for input(s): HGBA1C in the last 72 hours. CBG: No results for input(s): GLUCAP in the last 168 hours. Lipid Profile: No results for input(s): CHOL, HDL,  LDLCALC, TRIG, CHOLHDL, LDLDIRECT in the last 72 hours. Thyroid Function Tests: No results for input(s): TSH, T4TOTAL, FREET4, T3FREE, THYROIDAB in the last 72 hours. Anemia Panel: No results for input(s): VITAMINB12, FOLATE, FERRITIN, TIBC, IRON, RETICCTPCT in the last 72 hours. Urine analysis:    Component Value Date/Time   COLORURINE COLORLESS (A) 04/08/2018 0834   APPEARANCEUR CLEAR 04/08/2018 0834   LABSPEC 1.003 (L) 04/08/2018 0834   PHURINE 7.0 04/08/2018 0834   GLUCOSEU 50 (A) 04/08/2018 0834   HGBUR SMALL (A) 04/08/2018 0834   BILIRUBINUR NEGATIVE 04/08/2018 0834   KETONESUR NEGATIVE 04/08/2018 0834   PROTEINUR NEGATIVE 04/08/2018 0834   UROBILINOGEN 0.2 04/13/2015 1700   NITRITE NEGATIVE 04/08/2018 0834   LEUKOCYTESUR NEGATIVE 04/08/2018 0834   Sepsis Labs: @LABRCNTIP (procalcitonin:4,lacticidven:4)  ) Recent Results (from the past 240 hour(s))  Urine culture     Status: Abnormal   Collection Time: 04/02/18 11:11 PM  Result Value Ref Range Status   Specimen Description URINE, CLEAN CATCH  Final   Special Requests   Final    Normal Performed at [facility], [place] [address] [place], [place] [address]    Culture MULTIPLE SPECIES PRESENT, SUGGEST RECOLLECTION (A)  Final   Report Status 04/04/2018 FINAL  Final  Urine Culture     Status: None   Collection Time: 04/04/18 10:23 AM  Result Value Ref Range Status   MICRO NUMBER: [id number]  Final   SPECIMEN QUALITY: ADEQUATE  Final   Sample Source URINE  Final   STATUS: FINAL  Final   Result:   Final    Three or more organisms present, each greater than 10,000 cu/mL. May represent normal flora contamination from external genitalia. No further testing is required.      Studies: No results found.  Scheduled Meds: . apixaban  5 mg Oral BID  . clonazePAM  0.5 mg Oral BID  . dicyclomine  10 mg Oral Daily  . famotidine  40 mg Oral Daily  . gabapentin  300 mg Oral BID  . gabapentin  600 mg Oral QHS     Continuous Infusions: . ciprofloxacin 400 mg (04/10/18 1408)  . metronidazole 500 mg (04/10/18 1526)     LOS: 2 days     Alma Friendly, MD [facility]   If 7PM-7AM, please contact night-coverage [email or web address] 04/10/2018, 6:04 PM

## 2018-04-11 ENCOUNTER — Other Ambulatory Visit: Payer: Self-pay

## 2018-04-11 LAB — CBC WITH DIFFERENTIAL/PLATELET
ABS IMMATURE GRANULOCYTES: 0.01 10*3/uL (ref 0.00–0.07)
BASOS PCT: 1 %
Basophils Absolute: 0.1 10*3/uL (ref 0.0–0.1)
Eosinophils Absolute: 0.3 10*3/uL (ref 0.0–0.5)
Eosinophils Relative: 4 %
HEMATOCRIT: 30.5 % — AB (ref 36.0–46.0)
HEMOGLOBIN: 9.9 g/dL — AB (ref 12.0–15.0)
Immature Granulocytes: 0 %
LYMPHS ABS: 1.3 10*3/uL (ref 0.7–4.0)
LYMPHS PCT: 18 %
MCH: 30.5 pg (ref 26.0–34.0)
MCHC: 32.5 g/dL (ref 30.0–36.0)
MCV: 93.8 fL (ref 80.0–100.0)
MONOS PCT: 17 %
Monocytes Absolute: 1.2 10*3/uL — ABNORMAL HIGH (ref 0.1–1.0)
NEUTROS ABS: 4.2 10*3/uL (ref 1.7–7.7)
Neutrophils Relative %: 60 %
Platelets: 247 10*3/uL (ref 150–400)
RBC: 3.25 MIL/uL — ABNORMAL LOW (ref 3.87–5.11)
RDW: 11.9 % (ref 11.5–15.5)
WBC: 6.9 10*3/uL (ref 4.0–10.5)
nRBC: 0 % (ref 0.0–0.2)

## 2018-04-11 LAB — MRSA PCR SCREENING: MRSA by PCR: NEGATIVE

## 2018-04-11 MED ORDER — ALUM & MAG HYDROXIDE-SIMETH 200-200-20 MG/5ML PO SUSP
15.0000 mL | Freq: Four times a day (QID) | ORAL | Status: DC | PRN
  Administered 2018-04-11: 15 mL via ORAL
  Filled 2018-04-11 (×2): qty 30

## 2018-04-11 NOTE — Progress Notes (Signed)
Pharmacy Antibiotic Note  Debra White is a 84 y.o. female admitted on 04/08/2018 with diverticulitis.  Pharmacy has been consulted for Cipro dosing.   ID: D#4 for acute diverticulitis - Afebrile, WBC WNL, LA 1.94, no micro  Cipro 10/15 >> Flagyl 10/15 >>  Plan: Continue Cipro 400mg  IV Q12H Flagyl 500mg  IV q 8hrs Eliquis 5mg  BID If tolerates diet, change to po abx tomorrow.   Height: 5\' 4"  (162.6 cm) Weight: 118 lb (53.5 kg) IBW/kg (Calculated) : 54.7  Temp (24hrs), Avg:97.8 F (36.6 C), Min:97.4 F (36.3 C), Max:98.1 F (36.7 C)  Recent Labs  Lab 04/08/18 0836 04/09/18 0231 04/10/18 0301 04/11/18 0139  WBC 10.8* 9.9 8.3 6.9  CREATININE 0.84 0.99 0.86  --     Estimated Creatinine Clearance: 42.6 mL/min (by C-G formula based on SCr of 0.86 mg/dL).    Allergies  Allergen Reactions  . Latex Hives    TONGUE HAD BLISTERS WHEN HAD DENTAL SURGERY  . Amoxicillin Nausea And Vomiting  . Aspirin Other (See Comments)    Gi upset  . Barbiturates Other (See Comments)    Unknown reaction  . Celebrex [Celecoxib] Other (See Comments)    Unknown reaction  . Codeine Itching  . Evista [Raloxifene] Other (See Comments)    Unknown reaction  . Lipitor [Atorvastatin] Other (See Comments)    Unknown reaction  . Morphine And Related Other (See Comments)    Unknown reaction  . Oruvail [Ketoprofen] Other (See Comments)    Unknown reaction  . Pantoprazole Diarrhea  . Paraffin Other (See Comments)    Unknown reaction  . Prochlorperazine Other (See Comments)    Uncontrolled shaking  . Proloprim [Trimethoprim] Other (See Comments)    Unknown reaction  . Venlafaxine Nausea Only  . Vibra-Tab [Doxycycline] Other (See Comments)    Unknown reaction  . Vioxx [Rofecoxib] Other (See Comments)    edema  . Betadine [Povidone Iodine] Rash  . Caffeine Palpitations   Naydeen Speirs S. Alford Highland, PharmD, [place] Clinical Staff Pharmacist [phone]  Eilene Ghazi [facility] 04/11/2018 11:29  AM

## 2018-04-11 NOTE — Progress Notes (Signed)
PROGRESS NOTE  Debra White TIW:[id number] DOB: February 16, 1934 DOA: 04/08/2018 PCP: Unk Pinto, MD  HPI/Recap of past 24 hours: HPI from [name]  is a 84 y.o. 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  Patient reports chills but no documented fever.  No history of diarrhea.  Patient was recently  discharged home with a monitor to rule out paroxysmal A. fib which she could not wear due to allergies.  Patient has a long list of allergies and cannot take amoxicillin.  In the ER, CT abdomen showed diverticulitis and the patient was started on IV antibiotics Cipro and Flagyl. Pt admitted for further management.   Today, pt reported some LLQ pain after eating breakfast, denies any N/V, fever/chills.   Assessment/Plan: Active Problems:   Diverticulitis  Acute diverticulitis Currently afebrile, with resolved leukocytosis CT abdomen showed interval development of sigmoid colon diverticulitis. 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 Continue with Eliquis  Hypertension BP currently soft Held home atenolol, losartan  COPD Stable Continue with DuoNeb's  Hyperlipidemia Held statins for now  History of anxiety Continue Klonopin    Code Status: Full  Family Communication: Husband, and 2 sisters at bedside  Disposition Plan: Plan for 04/12/18   Consultants:  None  Procedures:  None  Antimicrobials:  Flagyl  Ciprofloxacin  DVT prophylaxis: Eliquis   Objective: Vitals:   04/10/18 1451 04/10/18 2103 04/11/18 0525 04/11/18 1412  BP: (!) 150/67 (!) 144/78 104/64 (!) 127/59    Pulse: 84 88 74 68  Resp:  16 18 16   Temp: (!) 97.4 F (36.3 C) 97.9 F (36.6 C) 98.1 F (36.7 C) 97.9 F (36.6 C)  TempSrc: Oral Oral Oral Oral  SpO2: 100% 99% 97% 98%  Weight:      Height:        Intake/Output Summary (Last 24 hours) at 04/11/2018 1754 Last data filed at 04/11/2018 0700 Gross per 24 hour  Intake 1882.2 ml  Output -  Net 1882.2 ml   Filed Weights   04/08/18 0823  Weight: 53.5 kg    Exam:   General: NAD  Cardiovascular: S1, S2 present  Respiratory: CTA B  Abdomen: Soft, TTP at LLQ, nondistended, BS present  Musculoskeletal: No pedal edema bilaterally  Skin: Normal  Psychiatry: Normal mood   Data Reviewed: CBC: Recent Labs  Lab 04/08/18 0836 04/09/18 0231 04/10/18 0301 04/11/18 0139  WBC 10.8* 9.9 8.3 6.9  NEUTROABS 8.6*  --  5.6 4.2  HGB 11.3* 10.7* 10.1* 9.9*  HCT 35.0* 33.5* 31.9* 30.5*  MCV 95.1 95.4 94.9 93.8  PLT 243 249 239 250   Basic Metabolic Panel: Recent Labs  Lab 04/08/18 0836 04/09/18 0231 04/10/18 0301  NA 128* 131* 131*  K 4.1 3.8 3.7  CL 93* 95* 98  CO2 27 27 27   GLUCOSE 142* 140* 104*  BUN 6* <5* <5*  CREATININE 0.84 0.99 0.86  CALCIUM 8.9 8.9 8.3*   GFR: Estimated Creatinine Clearance: 42.6 mL/min (by C-G formula based on SCr of 0.86 mg/dL). Liver Function Tests: Recent Labs  Lab 04/08/18 0836  AST 24  ALT 14  ALKPHOS 62  BILITOT 0.6  PROT 6.8  ALBUMIN 3.7   Recent Labs  Lab 04/08/18 0836  LIPASE 37   No results for input(s): AMMONIA in the last 168 hours. Coagulation Profile: No results for input(s): INR, PROTIME in the last 168 hours. Cardiac Enzymes: No results for input(s): CKTOTAL, CKMB, CKMBINDEX, TROPONINI in the last 168 hours. BNP (last 3 results) No results for input(s): PROBNP in the last 8760 hours. HbA1C: No results for input(s): HGBA1C in the last 72 hours. CBG: No results for input(s): GLUCAP in the last 168 hours. Lipid Profile: No results for input(s): CHOL,  HDL, LDLCALC, TRIG, CHOLHDL, LDLDIRECT in the last 72 hours. Thyroid Function Tests: No results for input(s): TSH, T4TOTAL, FREET4, T3FREE, THYROIDAB in the last 72 hours. Anemia Panel: No results for input(s): VITAMINB12, FOLATE, FERRITIN, TIBC, IRON, RETICCTPCT in the last 72 hours. Urine analysis:    Component Value Date/Time   COLORURINE COLORLESS (A) 04/08/2018 0834   APPEARANCEUR CLEAR 04/08/2018 0834   LABSPEC 1.003 (L) 04/08/2018 0834   PHURINE 7.0 04/08/2018 0834   GLUCOSEU 50 (A) 04/08/2018 0834   HGBUR SMALL (A) 04/08/2018 0834   BILIRUBINUR NEGATIVE 04/08/2018 0834   KETONESUR NEGATIVE 04/08/2018 0834   PROTEINUR NEGATIVE 04/08/2018 0834   UROBILINOGEN 0.2 04/13/2015 1700   NITRITE NEGATIVE 04/08/2018 0834   LEUKOCYTESUR NEGATIVE 04/08/2018 0834   Sepsis Labs: @LABRCNTIP (procalcitonin:4,lacticidven:4)  ) Recent Results (from the past 240 hour(s))  Urine culture     Status: Abnormal   Collection Time: 04/02/18 11:11 PM  Result Value Ref Range Status   Specimen Description URINE, CLEAN CATCH  Final   Special Requests   Final    Normal Performed at [facility], [place] [address], [place], [place] [address]    Culture MULTIPLE SPECIES PRESENT, SUGGEST RECOLLECTION (A)  Final   Report Status 04/04/2018 FINAL  Final  Urine Culture     Status: None   Collection Time: 04/04/18 10:23 AM  Result Value Ref Range Status   MICRO NUMBER: [id number]  Final   SPECIMEN QUALITY: ADEQUATE  Final   Sample Source URINE  Final   STATUS: FINAL  Final   Result:   Final    Three or more organisms present, each greater than 10,000 cu/mL. May represent normal flora contamination from external genitalia. No further testing is required.  MRSA PCR Screening     Status: None   Collection Time: 04/11/18  3:10 PM  Result Value Ref Range Status   MRSA by PCR NEGATIVE NEGATIVE Final    Comment:        The GeneXpert MRSA Assay (FDA approved for NASAL specimens only), is one component of  a comprehensive MRSA colonization surveillance program. It is not intended to diagnose MRSA infection nor to guide or monitor treatment for MRSA infections. Performed at [facility], [place] [address], [place], [place] [address]       Studies: No results found.  Scheduled Meds: . apixaban  5 mg Oral BID  . clonazePAM  0.5 mg Oral BID  . dicyclomine  10 mg Oral Daily  . famotidine  40 mg Oral Daily  . gabapentin  300 mg Oral BID  . gabapentin  600 mg Oral QHS    Continuous Infusions: . sodium chloride 10 mL/hr at 04/11/18 0700  . ciprofloxacin 400 mg (04/11/18 1458)  . metronidazole 500 mg (04/11/18 1329)     LOS: 3 days     Alma Friendly, MD [facility]   If 7PM-7AM, please contact night-coverage  [email or web address] 04/11/2018, 5:54 PM

## 2018-04-11 NOTE — Care Management Important Message (Signed)
Important Message  Patient Details  Name: Debra White MRN: [id number] Date of Birth: Jun 09, 1934   Medicare Important Message Given:  Yes    Koleson Reifsteck 04/11/2018, 1:45 PM

## 2018-04-12 DIAGNOSIS — D735 Infarction of spleen: Secondary | ICD-10-CM

## 2018-04-12 DIAGNOSIS — N280 Ischemia and infarction of kidney: Secondary | ICD-10-CM

## 2018-04-12 MED ORDER — METRONIDAZOLE 500 MG PO TABS: 500.0000 mg | ORAL_TABLET | Freq: Three times a day (TID) | ORAL | 0 refills | Status: AC

## 2018-04-12 MED ORDER — CIPROFLOXACIN HCL 500 MG PO TABS: 500.0000 mg | ORAL_TABLET | Freq: Two times a day (BID) | ORAL | 0 refills | Status: AC

## 2018-04-12 MED ORDER — APIXABAN 5 MG PO TABS: 5.0000 mg | ORAL_TABLET | Freq: Two times a day (BID) | ORAL | 0 refills | Status: DC

## 2018-04-12 NOTE — Discharge Summary (Signed)
Discharge Summary  Debra White:[id number] DOB: Nov 09, 1933  PCP: Unk Pinto, MD  Admit date: 04/08/2018 Discharge date: 04/12/2018  Time spent: 45 mins  Recommendations for Outpatient Follow-up:  1. PCP  Discharge Diagnoses:  Active Hospital Problems   Diagnosis Date Noted  . Diverticulitis 04/08/2018    Resolved Hospital Problems  No resolved problems to display.    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 Patient reports chills but no documented fever. No history of diarrhea. Patient was recently discharged home with a monitor to rule out paroxysmal A. fib which she could not wear due to allergies. Patient has a long list of allergies and cannot take amoxicillin. In the ER, CT abdomen showed diverticulitis and the patient was started on IV antibiotics Cipro and Flagyl. Pt admitted for further management.   Today, pt reported feeling much better, was able to eat, without any significant abdominal pain. Denies any N/V, diarrhea, chest pain, SOB, dizziness. Pt stable to be discharged, advised to adhere to soft diet and upgrade as she can tolerate.   Hospital Course:  Active Problems:   Diverticulitis  Acute diverticulitis Currently afebrile, with resolved leukocytosis CT abdomen showed interval development of sigmoid colon diverticulitis. 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 If you have any questions about your discharge medications or the care you received while you were in the hospital after you are discharged, you can call the unit and asked to speak with the hospitalist on call if the hospitalist that took care of you is not available. Once you are discharged, your primary care physician will handle any further medical issues. Please note that NO REFILLS for any discharge medications will be authorized once you are discharged, as it is imperative that you return to your primary care physician (or establish a relationship with a primary care physician if you do not have one) for your aftercare needs so that they can reassess your need for medications and monitor your lab values.   Allergies as of 04/12/2018      Reactions   Latex Hives   TONGUE HAD BLISTERS WHEN HAD DENTAL  SURGERY   Amoxicillin Nausea And Vomiting   Aspirin Other (See Comments)   Gi upset   Barbiturates Other (See Comments)   Unknown reaction   Celebrex [celecoxib] Other (See Comments)   Unknown reaction   Codeine Itching   Evista [raloxifene] Other (See Comments)   Unknown reaction   Lipitor [atorvastatin] Other (See Comments)   Unknown reaction   Morphine And Related Other (See  Comments)   Unknown reaction   Oruvail [ketoprofen] Other (See Comments)   Unknown reaction   Pantoprazole Diarrhea   Paraffin Other (See Comments)   Unknown reaction   Prochlorperazine Other (See Comments)   Uncontrolled shaking   Proloprim [trimethoprim] Other (See Comments)   Unknown reaction   Venlafaxine Nausea Only   Vibra-tab [doxycycline] Other (See Comments)   Unknown reaction   Vioxx [rofecoxib] Other (See Comments)   edema   Betadine [povidone Iodine] Rash   Caffeine Palpitations      Medication List    TAKE these medications   acetaminophen 500 MG tablet Commonly known as:  TYLENOL Take 500-1,000 mg by mouth every 6 (six) hours as needed for headache.   apixaban 5 MG Tabs tablet Commonly known as:  ELIQUIS Take 1 tablet (5 mg total) by mouth 2 (two) times daily. What changed:    medication strength  how much to take   atenolol 100 MG tablet Commonly known as:  TENORMIN TAKE 1 TABLET BY MOUTH DAILY FOR BLOOD PRESSURE What changed:    how much to take  how to take this  when to take this  additional instructions   ciprofloxacin 500 MG tablet Commonly known as:  CIPRO Take 1 tablet (500 mg total) by mouth 2 (two) times daily for 3 days. What changed:    medication strength  how much to take  when to take this   clonazePAM 1 MG tablet Commonly known as:  KLONOPIN TAKE 1/2 TO 1 TABLET BY MOUTH 2-3 TIMES DAILY ONLY IF NEEDED FOR ANXIETY ATTACK AND LIMIT TO 5 DAYS PER WEEK TO AVOID ADDICTION What changed:    how much to take  how to take this  when to take this  additional instructions   diclofenac sodium 1 % Gel Commonly known as:  VOLTAREN Apply 2 g topically 4 (four) times daily.   dicyclomine 10 MG capsule Commonly known as:  BENTYL Take 10 mg by mouth daily.   famotidine 40 MG tablet Commonly known as:  PEPCID Take 1 tablet (40 mg total) by mouth daily.   fluconazole 100 MG tablet Commonly known as:  DIFLUCAN Take 1  tablet daily for yeast infection   gabapentin 600 MG tablet Commonly known as:  NEURONTIN Take 1/2 to 1 tablet s 3 to 4 x / day as needed for Facial Pain What changed:    how much to take  how to take this  when to take this  additional instructions   hyoscyamine 0.125 MG SL tablet Commonly known as:  LEVSIN SL Dissolve 1 tablet under tongue every 4 hours as needed for Nausea, Cramping , bloating or Diarrhea What changed:    how much to take  how to take this  when to take this  reasons to take this  additional instructions   losartan 100 MG tablet Commonly known as:  COZAAR TAKE 1/2 TABLET BY MOUTH DAILY What changed:  when to take this   metroNIDAZOLE 500 MG tablet Commonly known as:  FLAGYL Take 1 tablet (500 mg total)  by mouth 3 (three) times daily for 3 days.   pravastatin 40 MG tablet Commonly known as:  PRAVACHOL Take 0.5 tablets (20 mg total) by mouth every evening.   ranitidine 300 MG tablet Commonly known as:  ZANTAC Take 300 mg by mouth at bedtime.   traMADol 50 MG tablet Commonly known as:  ULTRAM TAKE 1 TABLET BY MOUTH 4 TIMES DAILY AS NEEDED FOR PAIN What changed:    how much to take  how to take this  when to take this  reasons to take this  additional instructions   Vitamin D 2000 units tablet Take 4,000 Units by mouth daily.      Allergies  Allergen Reactions  . Latex Hives    TONGUE HAD BLISTERS WHEN HAD DENTAL SURGERY  . Amoxicillin Nausea And Vomiting  . Aspirin Other (See Comments)    Gi upset  . Barbiturates Other (See Comments)    Unknown reaction  . Celebrex [Celecoxib] Other (See Comments)    Unknown reaction  . Codeine Itching  . Evista [Raloxifene] Other (See Comments)    Unknown reaction  . Lipitor [Atorvastatin] Other (See Comments)    Unknown reaction  . Morphine And Related Other (See Comments)    Unknown reaction  . Oruvail [Ketoprofen] Other (See Comments)    Unknown reaction  . Pantoprazole  Diarrhea  . Paraffin Other (See Comments)    Unknown reaction  . Prochlorperazine Other (See Comments)    Uncontrolled shaking  . Proloprim [Trimethoprim] Other (See Comments)    Unknown reaction  . Venlafaxine Nausea Only  . Vibra-Tab [Doxycycline] Other (See Comments)    Unknown reaction  . Vioxx [Rofecoxib] Other (See Comments)    edema  . Betadine [Povidone Iodine] Rash  . Caffeine Palpitations   Follow-up Information    Unk Pinto, MD. Schedule an appointment as soon as possible for a visit in 1 week(s).   Specialty:  Internal Medicine Contact information: [address] [place]  [address] [phone]            The results of significant diagnostics from this hospitalization (including imaging, microbiology, ancillary and laboratory) are listed below for reference.    Significant Diagnostic Studies: Mr Brain Wo Contrast  Result Date: 03/23/2018 CLINICAL DATA:  RIGHT-sided numbness. History of hypertension, hyperlipidemia. EXAM: MRI HEAD WITHOUT CONTRAST TECHNIQUE: Multiplanar, multiecho pulse sequences of the brain and surrounding structures were obtained without intravenous contrast. COMPARISON:  MRI head September 15, 2017 FINDINGS: INTRACRANIAL CONTENTS: No reduced diffusion to suggest acute ischemia. No susceptibility artifact to suggest hemorrhage. The ventricles and sulci are normal for patient's age. Small area RIGHT occipital lobe encephalomalacia new from prior MRI. Scattered subcentimeter supratentorial white matter FLAIR T2 hyperintensities compatible with mild chronic small vessel ischemic changes, less than expected for age. No suspicious parenchymal signal, masses, mass effect. No abnormal extra-axial fluid collections. No extra-axial masses. VASCULAR: Normal major intracranial vascular flow voids present at skull base. SKULL AND UPPER CERVICAL SPINE: No abnormal sellar expansion. No suspicious calvarial bone marrow signal. ACDF.  Craniocervical junction maintained. SINUSES/ORBITS: The mastoid air-cells and included paranasal sinuses are well-aerated.The included ocular globes and orbital contents are non-suspicious. Status post bilateral ocular lens implants. OTHER: None. IMPRESSION: 1. No acute intracranial process. 2. Old small RIGHT occipital lobe/PCA territory infarct new from 2018. 3. Mild chronic small vessel ischemic changes. Electronically Signed   By: Elon Alas M.D.   On: 03/23/2018 06:34   Ct Abdomen Pelvis W Contrast  Result Date: 04/08/2018  CLINICAL DATA:  84 year old female on blood thinner for renal blood clot. Abdominal pain. Prior hysterectomy. Subsequent encounter. EXAM: CT ABDOMEN AND PELVIS WITH CONTRAST TECHNIQUE: Multidetector CT imaging of the abdomen and pelvis was performed using the standard protocol following bolus administration of intravenous contrast. CONTRAST:  147mL OMNIPAQUE IOHEXOL 300 MG/ML  SOLN COMPARISON:  04/02/2018 CT. FINDINGS: Lower chest: Minimal basilar atelectasis. Mild cardiomegaly. Coronary artery calcifications. Slightly dense breast parenchyma. Hepatobiliary: Enlarged liver spanning over 20.2 cm. No worrisome hepatic lesion. No calcified gallstones. No CT evidence of gallbladder inflammation. Pancreas: No worrisome pancreatic mass or inflammation. Spleen: Remote splenic infarcts. Adrenals/Urinary Tract: No obstructing stone or hydronephrosis. Result of infarct inferior right kidney. Subcentimeter low-density structures left kidney too small to characterize although statistically likely cysts. No adrenal lesion. Noncontrast filled imaging of the urinary bladder unremarkable. Stomach/Bowel: Interval development of sigmoid colon diverticulitis. Inflammatory process extends towards the left without drainable abscess or free air. Significant baseline diverticula and muscular hypertrophy. Vascular/Lymphatic: Atherosclerotic changes abdominal aorta and aortic branch vessels. No abdominal  aortic aneurysm or large arterial vessel occlusion. Portal vein with peripheral hypodensity on early phase imaging fills in on delayed imaging and therefore probably related to mixing of opacified and unopacified blood rather than thrombus. Scattered normal size lymph nodes. Reproductive: Post hysterectomy.  No worrisome adnexal mass. Other: No free intraperitoneal air or bowel containing hernia. No retroperitoneal hematoma. Musculoskeletal: Fusion lower thoracic spine and lower lumbar/upper sacrum. Schmorl's node deformity superior endplate L2. No acute osseous abnormality. IMPRESSION: 1. Interval development of sigmoid colon diverticulitis. Inflammatory process extends towards the left without drainable abscess or free air. Significant baseline diverticula and muscular hypertrophy. 2. Remote splenic and right renal infarct.  No acute infarct noted. 3. Aortic Atherosclerosis (ICD10-I70.0). Coronary artery calcifications. 4. Enlarged liver Electronically Signed   By: Genia Del M.D.   On: 04/08/2018 12:49   Ct Abdomen Pelvis W Contrast  Result Date: 03/22/2018 CLINICAL DATA:  Severe lower abdominal and right flank pain. History of diverticulitis. EXAM: CT ABDOMEN AND PELVIS WITH CONTRAST TECHNIQUE: Multidetector CT imaging of the abdomen and pelvis was performed using the standard protocol following bolus administration of intravenous contrast. CONTRAST:  14mL OMNIPAQUE IOHEXOL 300 MG/ML  SOLN COMPARISON:  09/08/2017 CT abdomen/pelvis. FINDINGS: Lower chest: No significant pulmonary nodules or acute consolidative airspace disease. Coronary atherosclerosis. Hepatobiliary: Normal liver size. No liver mass. Normal gallbladder with no radiopaque cholelithiasis. No biliary ductal dilatation. Pancreas: Normal, with no mass or duct dilation. Spleen: There is a wedge-shaped focus of low attenuation in the inferior spleen (series 3/image 20), new, compatible with acute splenic infarct. There is a curvilinear focus of  low attenuation in the superior spleen, new, compatible with subacute/chronic infarct. Adrenals/Urinary Tract: Normal adrenals. New small poorly defined low-attenuation focus in the posterior lower right kidney (series 8/image 21), suggestive of renal cortical infarct. Subcentimeter hypodense renal cortical lesions in the left kidney are too small to characterize and are stable, considered benign. No hydronephrosis. Normal bladder. Stomach/Bowel: Normal non-distended stomach. Normal caliber small bowel with no small bowel wall thickening. Appendix not discretely visualized. No pericecal inflammatory changes. Marked sigmoid diverticulosis, with no large bowel wall thickening or significant pericolonic fat stranding. Vascular/Lymphatic: Atherosclerotic nonaneurysmal abdominal aorta. Patent portal, splenic, hepatic and renal veins. No pathologically enlarged lymph nodes in the abdomen or pelvis. Reproductive: Status post hysterectomy, with no abnormal findings at the vaginal cuff. No adnexal mass. Other: No pneumoperitoneum, ascites or focal fluid collection. Musculoskeletal: No aggressive appearing focal osseous lesions.  Ankylosis at L4-5 with cerclage wire overlying the posterior lower lumbar spine elements. Moderate lumbar spondylosis. IMPRESSION: 1. Wedge-shaped focus of low attenuation in the inferior spleen, new, compatible with acute splenic infarct. Curvilinear focus of low attenuation in the superior spleen, new, compatible with subacute/chronic infarct. 2. Nonspecific new small poorly defined low-attenuation focus in the posterior lower right kidney, suggestive of acute renal cortical infarct. 3. Consider echocardiographic correlation to exclude a cardiac source of emboli. 4. Marked sigmoid diverticulosis. No evidence of acute diverticulitis. 5.  Aortic Atherosclerosis (ICD10-I70.0). 6. Coronary atherosclerosis. Electronically Signed   By: Ilona Sorrel M.D.   On: 03/22/2018 18:51   Ct Angio Abd/pel W And/or  Wo Contrast  Result Date: 04/02/2018 CLINICAL DATA:  Continue nausea, vomiting and abdominal pain. Patient actively vomiting and triage. EXAM: CTA ABDOMEN AND PELVIS WITHOUT AND WITH CONTRAST TECHNIQUE: Multidetector CT imaging of the abdomen and pelvis was performed using the standard protocol during bolus administration of intravenous contrast. Multiplanar reconstructed images and MIPs were obtained and reviewed to evaluate the vascular anatomy. CONTRAST:  161mL ISOVUE-370 IOPAMIDOL (ISOVUE-370) INJECTION 76% COMPARISON:  03/22/2018 FINDINGS: VASCULAR Aorta: Mild ectasia of the ascending included thoracic aorta to 3.5 cm. Mild atherosclerosis of the abdominal aorta without aneurysmal dilatation dissection. Celiac: Patent without evidence of aneurysm, dissection, vasculitis or significant stenosis. SMA: Patent without evidence of aneurysm, dissection, vasculitis or significant stenosis. Renals: Both renal arteries are patent without evidence of aneurysm, dissection, vasculitis, fibromuscular dysplasia or significant stenosis. IMA: Patent Inflow: Patent without evidence of aneurysm, dissection, vasculitis or significant stenosis. Atherosclerosis of both common iliac arteries without significant stenosis or aneurysm. Proximal Outflow: Mild atherosclerosis of the left common femoral artery without significant stenosis. Veins: Intraluminal thrombus of the IVC. Patent portal and splenic veins. Review of the MIP images confirms the above findings. NON-VASCULAR Lower chest: The main pulmonary artery is nondilated measuring up to 2.4 cm. No large central pulmonary embolus of the included pulmonary arterial system. Heart size is enlarged without pericardial effusion or thickening. Bibasilar dependent atelectasis is noted. Hepatobiliary: No focal liver abnormality is seen. No gallstones, gallbladder wall thickening, or biliary dilatation. Pancreas: Unremarkable. No pancreatic ductal dilatation or surrounding inflammatory  changes. Spleen: Previously noted splenic infarcts are no longer apparent. No mass or enhancing lesions. No new area of infarction. Adrenals/Urinary Tract: Normal bilateral adrenal glands. Small cortical cysts of both kidneys with areas of cortical scarring in the lower poles. No obstructive uropathy. Normal urinary bladder. Stomach/Bowel: The stomach is decompressed in appearance and there is no gastric fold thickening or mural thickening noted. The duodenum, ligament of Treitz in small bowel are nonacute. Moderate stool retention within the colon is identified with extensive circular muscle hypertrophy and sigmoid diverticulosis along the sigmoid colon. Minimal distal descending colonic diverticulosis is also noted. Appendix is not discretely identified. Lymphatic: No pathologically enlarged lymph nodes. Reproductive: Status post hysterectomy. No adnexal masses. Other: No pneumoperitoneum.  No abdominopelvic ascites. Musculoskeletal: Ankylosis of L4-5 cerclage wire overlying the posterior lower spinal elements. Anterolisthesis of L4 on L5 is noted. Mild Schmorl's node at L2. No aggressive osseous lesions. IMPRESSION: VASCULAR 1. Ectasia of the included ascending thoracic aorta. 2. Nonaneurysmal atherosclerotic abdominal aorta with patent branch vessels. No significant stenosis or occlusion identified. NON-VASCULAR 1. Apparent resolution of previously noted splenic infarcts. No new infarct identified. 2. No bowel obstruction or inflammation. Extensive sigmoid diverticulosis without acute diverticulitis. Electronically Signed   By: Ashley Royalty M.D.   On: 04/02/2018 20:11    Microbiology:  Recent Results (from the past 240 hour(s))  Urine culture     Status: Abnormal   Collection Time: 04/02/18 11:11 PM  Result Value Ref Range Status   Specimen Description URINE, CLEAN CATCH  Final   Special Requests   Final    Normal Performed at [facility], [address] [place], [place] [address]    Culture  MULTIPLE SPECIES PRESENT, SUGGEST RECOLLECTION (A)  Final   Report Status 04/04/2018 FINAL  Final  Urine Culture     Status: None   Collection Time: 04/04/18 10:23 AM  Result Value Ref Range Status   MICRO NUMBER: [id number]  Final   SPECIMEN QUALITY: ADEQUATE  Final   Sample Source URINE  Final   STATUS: FINAL  Final   Result:   Final    Three or more organisms present, each greater than 10,000 cu/mL. May represent normal flora contamination from external genitalia. No further testing is required.  MRSA PCR Screening     Status: None   Collection Time: 04/11/18  3:10 PM  Result Value Ref Range Status   MRSA by PCR NEGATIVE NEGATIVE Final    Comment:        The GeneXpert MRSA Assay (FDA approved for NASAL specimens only), is one component of a comprehensive MRSA colonization surveillance program. It is not intended to diagnose MRSA infection nor to guide or monitor treatment for MRSA infections. Performed at [facility], [place] [address] [place],  [address]      Labs: Basic Metabolic Panel: Recent Labs  Lab 04/08/18 0836 04/09/18 0231 04/10/18 0301  NA 128* 131* 131*  K 4.1 3.8 3.7  CL 93* 95* 98  CO2 27 27 27   GLUCOSE 142* 140* 104*  BUN 6* <5* <5*  CREATININE 0.84 0.99 0.86  CALCIUM 8.9 8.9 8.3*   Liver Function Tests: Recent Labs  Lab 04/08/18 0836  AST 24  ALT 14  ALKPHOS 62  BILITOT 0.6  PROT 6.8  ALBUMIN 3.7   Recent Labs  Lab 04/08/18 0836  LIPASE 37   No results for input(s): AMMONIA in the last 168 hours. CBC: Recent Labs  Lab 04/08/18 0836 04/09/18 0231 04/10/18 0301 04/11/18 0139  WBC 10.8* 9.9 8.3 6.9  NEUTROABS 8.6*  --  5.6 4.2  HGB 11.3* 10.7* 10.1* 9.9*  HCT 35.0* 33.5* 31.9* 30.5*  MCV 95.1 95.4 94.9 93.8  PLT 243 249 239 247   Cardiac Enzymes: No results for input(s): CKTOTAL, CKMB, CKMBINDEX, TROPONINI in the last 168 hours. BNP: BNP (last 3 results) No results for input(s): BNP in the last 8760  hours.  ProBNP (last 3 results) No results for input(s): PROBNP in the last 8760 hours.  CBG: No results for input(s): GLUCAP in the last 168 hours.     Signed:  Alma Friendly, MD [facility] 04/12/2018, 11:50 AM

## 2018-04-12 NOTE — Progress Notes (Signed)
D/c with family to home. D/c instructions given and explained with understanding.  Pain 6/10 medication given with relief. Breathing regular and unlabored on room air.

## 2018-04-14 ENCOUNTER — Telehealth: Payer: Self-pay | Admitting: *Deleted

## 2018-04-14 ENCOUNTER — Other Ambulatory Visit: Payer: Self-pay | Admitting: Internal Medicine

## 2018-04-14 DIAGNOSIS — R110 Nausea: Secondary | ICD-10-CM

## 2018-04-14 MED ORDER — ONDANSETRON HCL 8 MG PO TABS: ORAL_TABLET | ORAL | 0 refills | Status: DC

## 2018-04-14 NOTE — Telephone Encounter (Signed)
Called patient on 04/14/2018 , 9:53 AM in an attempt to reach the patient for a hospital follow up.   Admit date: 04/08/18 Discharge: 04/12/18   She DOES NOT  have any questions or concerns about medications from the hospital admission. The patient's medications were reviewed over the phone, they were counseled to bring in all current medications to the hospital follow up visit.   I advised the patient to call if any questions or concerns arise about the hospital admission or medications    Home health WAS NOT  started in the hospital.  All questions were answered and a follow up appointment was made.   Prior to Admission medications   Medication Sig Start Date End Date Taking? Authorizing Provider  acetaminophen (TYLENOL) 500 MG tablet Take 500-1,000 mg by mouth every 6 (six) hours as needed for headache.    [provider]  apixaban (ELIQUIS) 5 MG TABS tablet Take 1 tablet (5 mg total) by mouth 2 (two) times daily. 04/12/18 05/12/18  Alma Friendly, MD  atenolol (TENORMIN) 100 MG tablet TAKE 1 TABLET BY MOUTH DAILY FOR BLOOD PRESSURE Patient taking differently: Take 50 mg by mouth 2 (two) times daily.  03/26/18   Vicie Mutters, PA-C  Cholecalciferol (VITAMIN D) 2000 units tablet Take 4,000 Units by mouth daily.     [provider]  ciprofloxacin (CIPRO) 500 MG tablet Take 1 tablet (500 mg total) by mouth 2 (two) times daily for 3 days. 04/12/18 04/15/18  Alma Friendly, MD  clonazePAM (KLONOPIN) 1 MG tablet TAKE 1/2 TO 1 TABLET BY MOUTH 2-3 TIMES DAILY ONLY IF NEEDED FOR ANXIETY ATTACK AND LIMIT TO 5 DAYS PER WEEK TO AVOID ADDICTION Patient taking differently: Take 0.5 mg by mouth 2 (two) times daily.  03/19/18   Unk Pinto, MD  diclofenac sodium (VOLTAREN) 1 % GEL Apply 2 g topically 4 (four) times daily. 03/24/18   Geradine Girt, DO  dicyclomine (BENTYL) 10 MG capsule Take 10 mg by mouth daily. 03/10/18   [provider]  famotidine (PEPCID) 40  MG tablet Take 1 tablet (40 mg total) by mouth daily. 03/28/18   Unk Pinto, MD  fluconazole (DIFLUCAN) 100 MG tablet Take 1 tablet daily for yeast infection 04/04/18   Unk Pinto, MD  gabapentin (NEURONTIN) 600 MG tablet Take 1/2 to 1 tablet s 3 to 4 x / day as needed for Facial Pain Patient taking differently: Take 300-600 mg by mouth See admin instructions. Take 300 mg in the morning, 300 mg at 1400 and 600 mg at bedtime for facial pain 11/14/17 11/15/18  Unk Pinto, MD  hyoscyamine (LEVSIN SL) 0.125 MG SL tablet Dissolve 1 tablet under tongue every 4 hours as needed for Nausea, Cramping , bloating or Diarrhea Patient taking differently: Place 0.125 mg under the tongue every 4 (four) hours as needed for cramping (nausea, bloating, diarrhea).  03/07/18   Unk Pinto, MD  losartan (COZAAR) 100 MG tablet TAKE 1/2 TABLET BY MOUTH DAILY Patient taking differently: Take 50 mg by mouth every evening.  06/07/17   Unk Pinto, MD  metroNIDAZOLE (FLAGYL) 500 MG tablet Take 1 tablet (500 mg total) by mouth 3 (three) times daily for 3 days. 04/12/18 04/15/18  Alma Friendly, MD  pravastatin (PRAVACHOL) 40 MG tablet Take 0.5 tablets (20 mg total) by mouth every evening. 03/24/18   Geradine Girt, DO  ranitidine (ZANTAC) 300 MG tablet Take 300 mg by mouth at bedtime.    [provider]  traMADol (ULTRAM) 50 MG tablet TAKE 1 TABLET BY MOUTH 4 TIMES DAILY AS NEEDED FOR PAIN Patient taking differently: Take 50 mg by mouth 4 (four) times daily as needed (pain).  08/12/17   Vicie Mutters, PA-C  omeprazole (PRILOSEC OTC) 20 MG tablet Take 20 mg by mouth daily.  09/01/11  [provider]

## 2018-04-17 ENCOUNTER — Ambulatory Visit (INDEPENDENT_AMBULATORY_CARE_PROVIDER_SITE_OTHER): Payer: PPO | Admitting: Internal Medicine

## 2018-04-17 VITALS — BP 116/76 | HR 56 | Temp 97.0°F | Resp 16 | Ht 63.5 in | Wt 118.6 lb

## 2018-04-17 DIAGNOSIS — E871 Hypo-osmolality and hyponatremia: Secondary | ICD-10-CM | POA: Diagnosis not present

## 2018-04-17 DIAGNOSIS — K5792 Diverticulitis of intestine, part unspecified, without perforation or abscess without bleeding: Secondary | ICD-10-CM | POA: Diagnosis not present

## 2018-04-17 DIAGNOSIS — Z79899 Other long term (current) drug therapy: Secondary | ICD-10-CM | POA: Diagnosis not present

## 2018-04-17 DIAGNOSIS — R1084 Generalized abdominal pain: Secondary | ICD-10-CM

## 2018-04-17 DIAGNOSIS — D735 Infarction of spleen: Secondary | ICD-10-CM

## 2018-04-17 DIAGNOSIS — N280 Ischemia and infarction of kidney: Secondary | ICD-10-CM

## 2018-04-17 DIAGNOSIS — J439 Emphysema, unspecified: Secondary | ICD-10-CM

## 2018-04-17 DIAGNOSIS — I10 Essential (primary) hypertension: Secondary | ICD-10-CM

## 2018-04-17 NOTE — Patient Instructions (Signed)

## 2018-04-17 NOTE — Progress Notes (Signed)
[place]     This very nice 84 y.o. MWF was admitted to the hospital on  04/08/2018  and patient was discharged from the hospital on  04/12/2018. The patient now presents for follow up for transition from recent hospitalization.  The day after discharge  our clinical staff contacted the patient to assure stability and schedule a follow up appointment. The discharge summary, medications and diagnostic test results were reviewed before meeting with the patient. The patient was admitted for:   Diverticulitis  Generalized abdominal pain Chronic hyponatremia  Renal infarct [facility]) Splenic infarct Essential hypertension Pulmonary emphysema, COPD      This nice 84 yo MWF was admitted with 1 day hx/o Diverticulitis. CBC showed leukocytosis & Abd CT scan in the ER showed Diverticulitis & patient was started on IV Cipro & Flagyl. As patient defervesced and pain resolved and she was tolerating a regular diet and she was felt clinically stable, she was discharged on oral antibiotics. Since discharge , she has had no more sx's of abdominal pain.           Patient also had recent hospitalization 9/28-9/30/2019 for Splenic & renal Aa infarcts and was started on Eliquis.         Hospitalization discharge instructions and medications are reconciled with the patient.      Patient is also followed with Hypertension, Hyperlipidemia, Pre-Diabetes and Vitamin D Deficiency.      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      Patient is treated for HTN (1990) & BP has been controlled at home. Today's BP is at goal - 116/76. Patient has had no complaints of any cardiac type chest pain, palpitations, dyspnea/orthopnea/PND, dizziness, claudication, or dependent edema.     Hyperlipidemia is controlled with diet & Pravastatin. Patient denies myalgias or other med SE's. Last Lipids were at goal albeit elevated  Trig's: Lab Results  Component Value Date   CHOL 196 02/26/2018   HDL 69 02/26/2018   LDLCALC 99 02/26/2018   TRIG 184 (H) 02/26/2018   CHOLHDL 2.8 02/26/2018      Also, the patient has history of PreDiabetes (A1c 6.8%/2001 and then after losing 30# after her facial Fx, her A1c dropped to 5.8% in 2013 and then 5.3% by 2016). She has had no symptoms of reactive hypoglycemia, diabetic polys, paresthesias or visual blurring.  Last A1c was  Lab Results  Component Value Date   HGBA1C 5.8 (H) 02/26/2018      Further, the patient also has history of Vitamin D Deficiency and supplements vitamin D without any suspected side-effects. Last vitamin D was   Lab Results  Component Value Date   VD25OH 42 11/14/2017   Current Outpatient Medications on File Prior to Visit  Medication Sig  . acetaminophen (TYLENOL) 500 MG tablet Take 500-1,000 mg by mouth every 6 (six) hours as needed for headache.  Marland Kitchen apixaban (ELIQUIS) 5 MG TABS tablet Take 1 tablet (5 mg total) by mouth 2 (two) times daily.  Marland Kitchen atenolol (TENORMIN) 100 MG tablet TAKE 1 TABLET BY MOUTH DAILY FOR BLOOD PRESSURE (Patient taking differently: Take 50 mg by mouth 2 (two) times daily. )  . Cholecalciferol (VITAMIN D) 2000 units tablet Take 4,000 Units by mouth daily.   . clonazePAM (KLONOPIN) 1 MG tablet TAKE 1/2 TO 1 TABLET BY MOUTH 2-3 TIMES DAILY ONLY IF NEEDED FOR ANXIETY ATTACK AND  LIMIT TO 5 DAYS PER WEEK TO AVOID ADDICTION (Patient taking differently: Take 0.5 mg by mouth 2 (two) times daily. )  . diclofenac sodium (VOLTAREN) 1 % GEL Apply 2 g topically 4 (four) times daily.  Marland Kitchen dicyclomine (BENTYL) 10 MG capsule Take 10 mg by mouth daily.  . famotidine (PEPCID) 40 MG tablet Take 1 tablet (40 mg total) by mouth daily.  . fluconazole (DIFLUCAN) 100 MG tablet Take 1 tablet daily for yeast infection  . gabapentin (NEURONTIN) 600 MG tablet Take 1/2 to 1 tablet s 3 to 4 x / day as needed for Facial Pain (Patient taking differently: Take  300-600 mg by mouth See admin instructions. Take 300 mg in the morning, 300 mg at 1400 and 600 mg at bedtime for facial pain)  . hyoscyamine (LEVSIN SL) 0.125 MG SL tablet Dissolve 1 tablet under tongue every 4 hours as needed for Nausea, Cramping , bloating or Diarrhea (Patient taking differently: Place 0.125 mg under the tongue every 4 (four) hours as needed for cramping (nausea, bloating, diarrhea). )  . losartan (COZAAR) 100 MG tablet TAKE 1/2 TABLET BY MOUTH DAILY (Patient taking differently: Take 50 mg by mouth every evening. )  . ondansetron (ZOFRAN) 8 MG tablet Take 1 tablet 3 x  /day if needed for Nausea  . pravastatin (PRAVACHOL) 40 MG tablet Take 0.5 tablets (20 mg total) by mouth every evening.  . traMADol (ULTRAM) 50 MG tablet TAKE 1 TABLET BY MOUTH 4 TIMES DAILY AS NEEDED FOR PAIN (Patient taking differently: Take 50 mg by mouth 4 (four) times daily as needed (pain). )  . [DISCONTINUED] omeprazole (PRILOSEC OTC) 20 MG tablet Take 20 mg by mouth daily.   No current facility-administered medications on file prior to visit.    Allergies  Allergen Reactions  . Latex Hives    TONGUE HAD BLISTERS WHEN HAD DENTAL SURGERY  . Amoxicillin Nausea And Vomiting  . Aspirin Other (See Comments)    Gi upset  . Barbiturates Other (See Comments)    Unknown reaction  . Celebrex [Celecoxib] Other (See Comments)    Unknown reaction  . Codeine Itching  . Evista [Raloxifene] Other (See Comments)    Unknown reaction  . Lipitor [Atorvastatin] Other (See Comments)    Unknown reaction  . Morphine And Related Other (See Comments)    Unknown reaction  . Oruvail [Ketoprofen] Other (See Comments)    Unknown reaction  . Pantoprazole Diarrhea  . Paraffin Other (See Comments)    Unknown reaction  . Prochlorperazine Other (See Comments)    Uncontrolled shaking  . Proloprim [Trimethoprim] Other (See Comments)    Unknown reaction  . Venlafaxine Nausea Only  . Vibra-Tab [Doxycycline] Other (See  Comments)    Unknown reaction  . Vioxx [Rofecoxib] Other (See Comments)    edema  . Betadine [Povidone Iodine] Rash  . Caffeine Palpitations   PMHx:   Past Medical History:  Diagnosis Date  . Anxiety   . Arthritis   . Blood transfusion   . C. difficile colitis   . Candida esophagitis ([place]) 2013   EGD  . Cataract   . Chronic kidney disease    STONES  . Diverticulitis 04/05/2017  . Diverticulosis of colon (without mention of hemorrhage) 2007   Colonoscopy  . Dysrhythmia    RX  . Emphysema of lung ([place])   . Family history of colon cancer    sister  . Fibromyalgia   . Fracture, zygoma closed ([place]) 07/13/2011  .  GERD (gastroesophageal reflux disease)   . Headache(784.0)   . Hyperlipemia   . Hypertension   . Kidney stones   . Ptosis, bilateral   . Renal infarct ([place])   . Splenic infarct    Immunization History  Administered Date(s) Administered  . Influenza, High Dose Seasonal PF 03/25/2014, 04/05/2016  . Influenza-Unspecified 03/09/2013, 03/11/2015  . Pneumococcal Conjugate-13 12/21/2015  . Pneumococcal Polysaccharide-23 06/25/2002  . Td 06/26/2003   Past Surgical History:  Procedure Laterality Date  . ABDOMINAL HYSTERECTOMY    . BACK SURGERY     X2  . BACK SURGERY    . BREAST EXCISIONAL BIOPSY    . CERVICAL DISC SURGERY    . COLONOSCOPY    . FACIAL FRACTURE SURGERY    . HAND SURGERY     BIL   . ORIF TRIPOD FRACTURE  07/13/2011   Procedure: OPEN REDUCTION INTERNAL FIXATION (ORIF) TRIPOD FRACTURE;  Surgeon: Tyson Alias, MD;  Location: [facility];  Service: ENT;  Laterality: Right;  ORIF RIGHT ZYGOMA, ORBITAL FLOOR EXPLORATION WITH FROST STITCH (TEMPORARY TARSORRHAPHY)  . PTOSIS REPAIR Bilateral 11/22/2016   Procedure: INTERNAL PTOSIS REPAIR;  Surgeon: Clista Bernhardt, MD;  Location: [facility];  Service: Ophthalmology;  Laterality: Bilateral;  . SHOULDER ARTHROSCOPY W/ ROTATOR CUFF REPAIR     LFT   FHx:    Reviewed / unchanged  SHx:    Reviewed / unchanged   Systems Review:  Constitutional: Denies fever, chills, wt changes, headaches, insomnia, fatigue, night sweats, change in appetite. Eyes: Denies redness, blurred vision, diplopia, discharge, itchy, watery eyes.  ENT: Denies discharge, congestion, post nasal drip, epistaxis, sore throat, earache, hearing loss, dental pain, tinnitus, vertigo, sinus pain, snoring.  CV: Denies chest pain, palpitations, irregular heartbeat, syncope, dyspnea, diaphoresis, orthopnea, PND, claudication or edema. Respiratory: denies cough, dyspnea, DOE, pleurisy, hoarseness, laryngitis, wheezing.  Gastrointestinal: Denies dysphagia, odynophagia, heartburn, reflux, water brash, abdominal pain or cramps, nausea, vomiting, bloating, diarrhea, constipation, hematemesis, melena, hematochezia  or hemorrhoids. Genitourinary: Denies dysuria, frequency, urgency, nocturia, hesitancy, discharge, hematuria or flank pain. Musculoskeletal: Denies arthralgias, myalgias, stiffness, jt. swelling, pain, limping or strain/sprain.  Skin: Denies pruritus, rash, hives, warts, acne, eczema or change in skin lesion(s). Neuro: No weakness, tremor, incoordination, spasms, paresthesia or pain. Psychiatric: Denies confusion, memory loss or sensory loss. Endo: Denies change in weight, skin or hair change.  Heme/Lymph: No excessive bleeding, bruising or enlarged lymph nodes.  Physical Exam  BP 116/76   Pulse (!) 56   Temp (!) 97 F (36.1 C)   Resp 16   Ht 5' 3.5" (1.613 m)   Wt 118 lb 9.6 oz (53.8 kg)   BMI 20.68 kg/m   Appears well nourished, well groomed  and in no distress.  Eyes: PERRLA, EOMs, conjunctiva no swelling or erythema. Sinuses: No frontal/maxillary tenderness ENT/Mouth: EAC's clear, TM's nl w/o erythema, bulging. Nares clear w/o erythema, swelling, exudates. Oropharynx clear without erythema or exudates. Oral hygiene is good. Tongue normal, non obstructing. Hearing intact.  Neck: Supple. Thyroid nl. Car 2+/2+ without  bruits, nodes or JVD. Chest: Respirations nl with BS clear & equal w/o rales, rhonchi, wheezing or stridor.  Cor: Heart sounds normal w/ regular rate and rhythm without sig. murmurs, gallops, clicks or rubs. Peripheral pulses normal and equal  without edema.  Abdomen: Soft & bowel sounds normal. Non-tender w/o guarding, rebound, hernias, masses or organomegaly.  Lymphatics: Unremarkable.  Musculoskeletal: Full ROM all peripheral extremities, joint stability, 5/5 strength and normal gait.  Skin:  Warm, dry without exposed rashes, lesions or ecchymosis apparent.  Neuro: Cranial nerves intact, reflexes equal bilaterally. Sensory-motor testing grossly intact. Tendon reflexes grossly intact.  Pysch: Alert & oriented x 3.  Insight and judgement nl & appropriate. No ideations.  Assessment and Plan:  1. Diverticulitis - CBC with Differential/Platelet  2. Generalized abdominal pain  3. Chronic hyponatremia  - COMPLETE METABOLIC PANEL WITH GFR  4. Renal infarct   5. Splenic infarct  6. Essential hypertension  - Continue medication, monitor blood pressure at home.  - Continue DASH diet. Reminder to go to the ER if any CP,  SOB, nausea, dizziness, severe HA, changes vision/speech.  - CBC with Differential/Platelet - COMPLETE METABOLIC PANEL WITH GFR - Magnesium  7. Pulmonary emphysema ([place])  8. Medication management  - CBC with Differential/Platelet - COMPLETE METABOLIC PANEL WITH GFR - Magnesium      Discussed  regular exercise, BP monitoring, weight control to achieve/maintain BMI less than 25 and discussed meds and SE's. Recommended labs to assess and monitor clinical status with further disposition pending results of labs. Over 30 minutes of exam, counseling, chart review was performed.

## 2018-04-18 LAB — COMPLETE METABOLIC PANEL WITH GFR
AG Ratio: 1.6 (calc) (ref 1.0–2.5)
ALKALINE PHOSPHATASE (APISO): 64 U/L (ref 33–130)
ALT: 16 U/L (ref 6–29)
AST: 28 U/L (ref 10–35)
Albumin: 4.3 g/dL (ref 3.6–5.1)
BUN: 9 mg/dL (ref 7–25)
CHLORIDE: 96 mmol/L — AB (ref 98–110)
CO2: 29 mmol/L (ref 20–32)
Calcium: 9.6 mg/dL (ref 8.6–10.4)
Creat: 0.71 mg/dL (ref 0.60–0.88)
GFR, Est African American: 92 mL/min/{1.73_m2} (ref >=60)
GFR, Est Non African American: 79 mL/min/{1.73_m2} (ref >=60)
GLOBULIN: 2.7 g/dL (ref 1.9–3.7)
GLUCOSE: 89 mg/dL (ref 65–99)
Potassium: 5.6 mmol/L — ABNORMAL HIGH (ref 3.5–5.3)
Sodium: 133 mmol/L — ABNORMAL LOW (ref 135–146)
TOTAL PROTEIN: 7 g/dL (ref 6.1–8.1)
Total Bilirubin: 0.3 mg/dL (ref 0.2–1.2)

## 2018-04-18 LAB — CBC WITH DIFFERENTIAL/PLATELET
BASOS PCT: 0.7 %
Basophils Absolute: 53 cells/uL (ref 0–200)
EOS ABS: 129 {cells}/uL (ref 15–500)
Eosinophils Relative: 1.7 %
HCT: 34.5 % — ABNORMAL LOW (ref 35.0–45.0)
Hemoglobin: 11.6 g/dL — ABNORMAL LOW (ref 11.7–15.5)
Lymphs Abs: 2075 cells/uL (ref 850–3900)
MCH: 30.8 pg (ref 27.0–33.0)
MCHC: 33.6 g/dL (ref 32.0–36.0)
MCV: 91.5 fL (ref 80.0–100.0)
MONOS PCT: 14.2 %
MPV: 10.1 fL (ref 7.5–12.5)
NEUTROS PCT: 56.1 %
Neutro Abs: 4264 cells/uL (ref 1500–7800)
PLATELETS: 351 10*3/uL (ref 140–400)
RBC: 3.77 10*6/uL — AB (ref 3.80–5.10)
RDW: 12.4 % (ref 11.0–15.0)
Total Lymphocyte: 27.3 %
WBC: 7.6 10*3/uL (ref 3.8–10.8)
WBCMIX: 1079 {cells}/uL — AB (ref 200–950)

## 2018-04-18 LAB — MAGNESIUM: Magnesium: 1.9 mg/dL (ref 1.5–2.5)

## 2018-04-20 ENCOUNTER — Encounter: Payer: Self-pay | Admitting: Internal Medicine

## 2018-04-21 ENCOUNTER — Other Ambulatory Visit: Payer: Self-pay

## 2018-04-21 NOTE — Patient Outreach (Signed)
[place] [facility]) Care Management  04/21/2018  MAKYAH LAVIGNE 15-May-1934 [id number]   Referral Date: 04/21/18 Referral Source: Nurseline Referral Reason: Questions about Zantac   Outreach Attempt: No answer.  HIPAA compliant voice message left.    Plan: RN CM will send letter and attempt within 4 business days.     Jone Baseman, RN, MSN [facility] Care Management Care Management Coordinator Direct Line [phone] Toll Free: [phone]  Fax: [phone]

## 2018-04-21 NOTE — Patient Outreach (Signed)
[place] [facility]) Care Management  04/21/2018  LOVELLE DEITRICK January 27, 1934 [id number]   Referral Date: 04/21/18 Referral Source: Nurseline Referral Reason: Questions about Zantac  Outreach Attempt: spoke with patient.  She is able to verify HIPAA.  Discussed reason for call.  She states that she will be talking to her pharmacist or MD about her Zantac.  Patient states she is doing well and denies any further needs.     Plan: RN CM will close case.     Jone Baseman, RN, MSN [facility] Care Management Care Management Coordinator Direct Line [phone] Toll Free: [phone]  Fax: [phone]

## 2018-04-24 ENCOUNTER — Ambulatory Visit: Payer: PPO

## 2018-04-25 ENCOUNTER — Telehealth: Payer: Self-pay | Admitting: *Deleted

## 2018-04-25 ENCOUNTER — Emergency Department (HOSPITAL_COMMUNITY): Payer: PPO

## 2018-04-25 ENCOUNTER — Ambulatory Visit: Payer: PPO | Admitting: Nurse Practitioner

## 2018-04-25 ENCOUNTER — Telehealth: Payer: Self-pay | Admitting: Surgery

## 2018-04-25 ENCOUNTER — Emergency Department (HOSPITAL_COMMUNITY)
Admission: EM | Admit: 2018-04-25 | Discharge: 2018-04-25 | Disposition: A | Discharge status: Home / Self Care | Payer: PPO | Attending: Emergency Medicine | Admitting: Emergency Medicine

## 2018-04-25 ENCOUNTER — Other Ambulatory Visit: Payer: Self-pay | Admitting: Internal Medicine

## 2018-04-25 DIAGNOSIS — Z91040 Latex allergy status: Secondary | ICD-10-CM | POA: Diagnosis not present

## 2018-04-25 DIAGNOSIS — R1084 Generalized abdominal pain: Secondary | ICD-10-CM | POA: Diagnosis not present

## 2018-04-25 DIAGNOSIS — J449 Chronic obstructive pulmonary disease, unspecified: Secondary | ICD-10-CM | POA: Diagnosis not present

## 2018-04-25 DIAGNOSIS — R109 Unspecified abdominal pain: Secondary | ICD-10-CM | POA: Diagnosis not present

## 2018-04-25 DIAGNOSIS — I129 Hypertensive chronic kidney disease with stage 1 through stage 4 chronic kidney disease, or unspecified chronic kidney disease: Secondary | ICD-10-CM | POA: Insufficient documentation

## 2018-04-25 DIAGNOSIS — R52 Pain, unspecified: Secondary | ICD-10-CM | POA: Diagnosis not present

## 2018-04-25 DIAGNOSIS — N189 Chronic kidney disease, unspecified: Secondary | ICD-10-CM | POA: Diagnosis not present

## 2018-04-25 DIAGNOSIS — Z79899 Other long term (current) drug therapy: Secondary | ICD-10-CM | POA: Insufficient documentation

## 2018-04-25 DIAGNOSIS — I10 Essential (primary) hypertension: Secondary | ICD-10-CM | POA: Diagnosis not present

## 2018-04-25 DIAGNOSIS — K5792 Diverticulitis of intestine, part unspecified, without perforation or abscess without bleeding: Secondary | ICD-10-CM | POA: Insufficient documentation

## 2018-04-25 DIAGNOSIS — R1032 Left lower quadrant pain: Secondary | ICD-10-CM | POA: Diagnosis not present

## 2018-04-25 DIAGNOSIS — K5732 Diverticulitis of large intestine without perforation or abscess without bleeding: Secondary | ICD-10-CM | POA: Diagnosis not present

## 2018-04-25 LAB — COMPREHENSIVE METABOLIC PANEL
ALBUMIN: 3.9 g/dL (ref 3.5–5.0)
ALT: 14 U/L (ref 0–44)
AST: 20 U/L (ref 15–41)
Alkaline Phosphatase: 49 U/L (ref 38–126)
Anion gap: 8 (ref 5–15)
BUN: 7 mg/dL — ABNORMAL LOW (ref 8–23)
CALCIUM: 8.9 mg/dL (ref 8.9–10.3)
CHLORIDE: 95 mmol/L — AB (ref 98–111)
CO2: 27 mmol/L (ref 22–32)
CREATININE: 0.81 mg/dL (ref 0.44–1.00)
GFR calc non Af Amer: >60 mL/min (ref >60)
GLUCOSE: 99 mg/dL (ref 70–99)
Potassium: 3.7 mmol/L (ref 3.5–5.1)
SODIUM: 130 mmol/L — AB (ref 135–145)
Total Bilirubin: 0.8 mg/dL (ref 0.3–1.2)
Total Protein: 6.9 g/dL (ref 6.5–8.1)

## 2018-04-25 LAB — LIPASE, BLOOD: Lipase: 36 U/L (ref 11–51)

## 2018-04-25 LAB — CBC WITH DIFFERENTIAL/PLATELET
ABS IMMATURE GRANULOCYTES: 0.02 10*3/uL (ref 0.00–0.07)
BASOS ABS: 0 10*3/uL (ref 0.0–0.1)
BASOS PCT: 1 %
Eosinophils Absolute: 0 10*3/uL (ref 0.0–0.5)
Eosinophils Relative: 0 %
HCT: 35.5 % — ABNORMAL LOW (ref 36.0–46.0)
HEMOGLOBIN: 11.5 g/dL — AB (ref 12.0–15.0)
IMMATURE GRANULOCYTES: 0 %
LYMPHS PCT: 15 %
Lymphs Abs: 1.3 10*3/uL (ref 0.7–4.0)
MCH: 30.6 pg (ref 26.0–34.0)
MCHC: 32.4 g/dL (ref 30.0–36.0)
MCV: 94.4 fL (ref 80.0–100.0)
MONO ABS: 0.7 10*3/uL (ref 0.1–1.0)
Monocytes Relative: 8 %
NEUTROS ABS: 6.6 10*3/uL (ref 1.7–7.7)
NEUTROS PCT: 76 %
Platelets: 252 10*3/uL (ref 150–400)
RBC: 3.76 MIL/uL — AB (ref 3.87–5.11)
RDW: 12.3 % (ref 11.5–15.5)
WBC: 8.7 10*3/uL (ref 4.0–10.5)
nRBC: 0 % (ref 0.0–0.2)

## 2018-04-25 MED ORDER — SODIUM CHLORIDE 0.9 % IV BOLUS
500.0000 mL | Freq: Once | INTRAVENOUS | Status: AC
  Administered 2018-04-25: 500 mL via INTRAVENOUS

## 2018-04-25 MED ORDER — IOPAMIDOL (ISOVUE-370) INJECTION 76%
INTRAVENOUS | Status: AC
  Filled 2018-04-25: qty 100

## 2018-04-25 MED ORDER — AMOXICILLIN-POT CLAVULANATE 875-125 MG PO TABS: 1.0000 | ORAL_TABLET | Freq: Two times a day (BID) | ORAL | 0 refills | Status: DC

## 2018-04-25 MED ORDER — HYDROCODONE-ACETAMINOPHEN 5-325 MG PO TABS: 1.0000 | ORAL_TABLET | Freq: Four times a day (QID) | ORAL | 0 refills | Status: DC | PRN

## 2018-04-25 MED ORDER — FENTANYL CITRATE (PF) 100 MCG/2ML IJ SOLN
40.0000 ug | INTRAMUSCULAR | Status: DC | PRN
  Administered 2018-04-25: 40 ug via INTRAVENOUS
  Filled 2018-04-25: qty 2

## 2018-04-25 MED ORDER — IOPAMIDOL (ISOVUE-370) INJECTION 76%
100.0000 mL | Freq: Once | INTRAVENOUS | Status: AC | PRN
  Administered 2018-04-25: 100 mL via INTRAVENOUS

## 2018-04-25 NOTE — ED Notes (Signed)
D/c reviewed with patient and family 

## 2018-04-25 NOTE — ED Provider Notes (Signed)
[facility] EMERGENCY DEPARTMENT Provider Note   CSN: [id number] Arrival date & time: 04/25/18  1115     History   Chief Complaint Chief Complaint  Patient presents with  . Abdominal Pain    HPI Debra White is a 84 y.o. female.  Patient with history of COPD, splenic infarct, on anticoagulant presents with worsening left lower quadrant abdominal pain that was sudden onset earlier today.  Decreased appetite.  No vomiting or fevers.  Patient denies atrial fibrillation history.  Pain fairly constant.     Past Medical History:  Diagnosis Date  . Anxiety   . Arthritis   . Blood transfusion   . C. difficile colitis   . Candida esophagitis ([place]) 2013   EGD  . Cataract   . Chronic kidney disease    STONES  . Diverticulitis 04/05/2017  . Diverticulosis of colon (without mention of hemorrhage) 2007   Colonoscopy  . Dysrhythmia    RX  . Emphysema of lung ([place])   . Family history of colon cancer    sister  . Fibromyalgia   . Fracture, zygoma closed ([place]) 07/13/2011  . GERD (gastroesophageal reflux disease)   . Headache(784.0)   . Hyperlipemia   . Hypertension   . Kidney stones   . Ptosis, bilateral   . Renal infarct ([place])   . Splenic infarct     Patient Active Problem List   Diagnosis Date Noted  . Diverticulitis 04/08/2018  . Coronary atherosclerosis due to lipid rich plaque 03/26/2018  . Splenic infarct 03/22/2018  . Renal infarct ([place]) 03/22/2018  . Chronic hyponatremia 03/22/2018  . Chronic hyperkalemia 03/22/2018  . Right sided numbness 03/22/2018  . Hepatic steatosis 11/17/2017  . Aortic atherosclerosis ([place]) 08/08/2017  . COPD (chronic obstructive pulmonary disease) with emphysema ([place]) 12/21/2015  . 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 Osteoporosis 12/21/2015  . Mild malnutrition ([place]) 05/11/2015  . Encounter for Medicare annual wellness exam 04/08/2015  . Medication management 01/27/2014  . 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  Vitamin D deficiency 07/08/2013  . Hypertension   . Hyperlipidemia   . GERD   . Headache, post-traumatic, chronic 09/20/2011    Past Surgical History:  Procedure Laterality Date  . ABDOMINAL HYSTERECTOMY    . BACK SURGERY     X2  . BACK SURGERY    . BREAST EXCISIONAL BIOPSY    . CERVICAL DISC SURGERY    . COLONOSCOPY    . FACIAL FRACTURE SURGERY    . HAND SURGERY     BIL   . ORIF TRIPOD FRACTURE  07/13/2011   Procedure: OPEN REDUCTION INTERNAL FIXATION (ORIF) TRIPOD FRACTURE;  Surgeon: Tyson Alias, MD;  Location: [facility];  Service: ENT;  Laterality: Right;  ORIF RIGHT ZYGOMA, ORBITAL FLOOR EXPLORATION WITH FROST STITCH (TEMPORARY TARSORRHAPHY)  . PTOSIS REPAIR Bilateral 11/22/2016   Procedure: INTERNAL PTOSIS REPAIR;  Surgeon: Clista Bernhardt, MD;  Location: [facility];  Service: Ophthalmology;  Laterality: Bilateral;  . SHOULDER ARTHROSCOPY W/ ROTATOR CUFF REPAIR     LFT     OB History   None      Home Medications    Prior to Admission medications   Medication Sig Start Date End Date Taking? Authorizing Provider  acetaminophen (TYLENOL) 500 MG tablet Take 500-1,000 mg by mouth every 6 (six) hours as needed for headache.    [provider]  apixaban (ELIQUIS) 5 MG TABS tablet Take 1 tablet (5 mg total) by mouth 2 (  two) times daily. 04/12/18 05/12/18  Alma Friendly, MD  atenolol (TENORMIN) 100 MG tablet TAKE 1 TABLET BY MOUTH DAILY FOR BLOOD PRESSURE Patient taking differently: Take 50 mg by mouth 2 (two) times daily.  03/26/18   Vicie Mutters, PA-C  Cholecalciferol (VITAMIN D) 2000 units tablet Take 4,000 Units by mouth daily.     [provider]  clonazePAM (KLONOPIN) 1 MG tablet TAKE 1/2 TO 1 TABLET BY MOUTH 2-3 TIMES DAILY ONLY IF NEEDED FOR ANXIETY ATTACK AND LIMIT TO 5 DAYS PER WEEK TO AVOID ADDICTION Patient taking differently: Take 0.5 mg by mouth 2 (two) times daily.  03/19/18   Unk Pinto, MD  diclofenac sodium (VOLTAREN) 1 % GEL Apply  2 g topically 4 (four) times daily. 03/24/18   Geradine Girt, DO  dicyclomine (BENTYL) 10 MG capsule Take 10 mg by mouth daily. 03/10/18   [provider]  famotidine (PEPCID) 40 MG tablet Take 1 tablet (40 mg total) by mouth daily. 03/28/18   Unk Pinto, MD  fluconazole (DIFLUCAN) 100 MG tablet Take 1 tablet daily for yeast infection 04/04/18   Unk Pinto, MD  gabapentin (NEURONTIN) 600 MG tablet Take 1/2 to 1 tablet s 3 to 4 x / day as needed for Facial Pain Patient taking differently: Take 300-600 mg by mouth See admin instructions. Take 300 mg in the morning, 300 mg at 1400 and 600 mg at bedtime for facial pain 11/14/17 11/15/18  Unk Pinto, MD  hyoscyamine (LEVSIN SL) 0.125 MG SL tablet Dissolve 1 tablet under tongue every 4 hours as needed for Nausea, Cramping , bloating or Diarrhea Patient taking differently: Place 0.125 mg under the tongue every 4 (four) hours as needed for cramping (nausea, bloating, diarrhea).  03/07/18   Unk Pinto, MD  losartan (COZAAR) 100 MG tablet TAKE 1/2 TABLET BY MOUTH DAILY Patient taking differently: Take 50 mg by mouth every evening.  06/07/17   Unk Pinto, MD  ondansetron [facility]) 8 MG tablet Take 1 tablet 3 x  /day if needed for Nausea 04/14/18   Unk Pinto, MD  pravastatin (PRAVACHOL) 40 MG tablet Take 0.5 tablets (20 mg total) by mouth every evening. 03/24/18   Geradine Girt, DO  traMADol (ULTRAM) 50 MG tablet TAKE 1 TABLET BY MOUTH 4 TIMES DAILY AS NEEDED FOR PAIN Patient taking differently: Take 50 mg by mouth 4 (four) times daily as needed (pain).  08/12/17   Vicie Mutters, PA-C  omeprazole (PRILOSEC OTC) 20 MG tablet Take 20 mg by mouth daily.  09/01/11  [provider]    Family History Family History  Problem Relation Age of Onset  . Heart disease Mother   . Heart disease Father   . Breast cancer Sister 87  . Colon cancer Sister   . Cancer Sister        breast  . Cancer Sister        colon  .  Cancer Sister        colon  . Hypertension Daughter   . Hyperlipidemia Daughter     Social History Social History   Tobacco Use  . Smoking status: Never Smoker  . Smokeless tobacco: Never Used  Substance Use Topics  . Alcohol use: No  . Drug use: No     Allergies   Latex; Amoxicillin; Aspirin; Barbiturates; Celebrex [celecoxib]; Codeine; Evista [raloxifene]; Lipitor [atorvastatin]; Morphine and related; Oruvail [ketoprofen]; Pantoprazole; Paraffin; Prochlorperazine; Proloprim [trimethoprim]; Venlafaxine; Vibra-tab [doxycycline]; Vioxx [rofecoxib]; Betadine [povidone iodine]; and Caffeine  Review of Systems Review of Systems  Constitutional: Negative for chills and fever.  HENT: Negative for congestion.   Eyes: Negative for visual disturbance.  Respiratory: Negative for shortness of breath.   Cardiovascular: Negative for chest pain.  Gastrointestinal: Positive for abdominal pain. Negative for vomiting.  Genitourinary: Negative for dysuria and flank pain.  Musculoskeletal: Negative for back pain, neck pain and neck stiffness.  Skin: Negative for rash.  Neurological: Negative for light-headedness and headaches.     Physical Exam Updated Vital Signs BP (!) 178/83 (BP Location: Right Arm)   Pulse 60   Temp 97.9 F (36.6 C) (Oral)   Resp 20   SpO2 99%   Physical Exam  Constitutional: She is oriented to person, place, and time. She appears well-developed and well-nourished.  HENT:  Head: Normocephalic and atraumatic.  Eyes: Conjunctivae are normal. Right eye exhibits no discharge. Left eye exhibits no discharge.  Neck: Normal range of motion. Neck supple. No tracheal deviation present.  Cardiovascular: Normal rate and regular rhythm.  Pulmonary/Chest: Effort normal and breath sounds normal.  Abdominal: Soft. She exhibits no distension. There is tenderness (LLQ). There is no guarding.  Genitourinary: Fullness:    Musculoskeletal: She exhibits no edema.  Neurological:  She is alert and oriented to person, place, and time.  Skin: Skin is warm. No rash noted.  Psychiatric: She has a normal mood and affect.  Nursing note and vitals reviewed.    ED Treatments / Results  Labs (all labs ordered are listed, but only abnormal results are displayed) Labs Reviewed  CBC WITH DIFFERENTIAL/PLATELET  COMPREHENSIVE METABOLIC PANEL  LIPASE, BLOOD    EKG None  Radiology No results found.  Procedures Procedures (including critical care time)  Medications Ordered in ED Medications  sodium chloride 0.9 % bolus 500 mL (has no administration in time range)  fentaNYL (SUBLIMAZE) injection 40 mcg (has no administration in time range)     Initial Impression / Assessment and Plan / ED Course  I have reviewed the triage vital signs and the nursing notes.  Pertinent labs & imaging results that were available during my care of the patient were reviewed by me and considered in my medical decision making (see chart for details).    Patient presents with left lower quadrant abdominal pain sudden onset.  Discussed clinical concern for recurrent infarct versus diverticulitis.  Plan for CT Angiofor further delineation and blood work.  Pain meds ordered.  IV fluids ordered. Blood work reviewed overall unremarkable.  CT scan reviewed diverticulitis.  Patient has no fever, no vomiting plan for close outpatient follow-up with antibiotics and pain meds as needed.  Results and differential diagnosis were discussed with the patient/parent/guardian. Xrays were independently reviewed by myself.  Close follow up outpatient was discussed, comfortable with the plan.   Medications  fentaNYL (SUBLIMAZE) injection 40 mcg (40 mcg Intravenous Given 04/25/18 1326)  iopamidol (ISOVUE-370) 76 % injection (has no administration in time range)  sodium chloride 0.9 % bolus 500 mL (0 mLs Intravenous Stopped 04/25/18 1501)  iopamidol (ISOVUE-370) 76 % injection 100 mL (100 mLs Intravenous  Contrast Given 04/25/18 1406)    Vitals:   04/25/18 1345 04/25/18 1400 04/25/18 1415 04/25/18 1430  BP: (!) 165/96  (!) 151/89 (!) 164/76  Pulse: 74 72    Resp: 11 19  18   Temp:      TempSrc:      SpO2: 97% 96%      Final diagnoses:  Left lower quadrant abdominal pain  Acute diverticulitis    Final Clinical Impressions(s) / ED Diagnoses   Final diagnoses:  Left lower quadrant abdominal pain    ED Discharge Orders    None       Elnora Morrison, MD 04/25/18 [phone]

## 2018-04-25 NOTE — Telephone Encounter (Deleted)
ED CM received call from patient's daughter Rae Halsted concerning [facility] services patient was supposed to receive post discharge. Patient were awaiting a call to arrange services. ED CM reviewed patient record, and noted [facility] orders present but no CM consult was placed. CM apologized, offered choice of [facility] services no preference [facility] contacted spoke with Glyn Ade RN Liaison and she has accepted the referral,  and start care will begin tomorrow. Daughter is agreeable, and appreciative of the assistance. CM provided her with my contact information should she have any further questions or needs.

## 2018-04-25 NOTE — ED Notes (Signed)
Pt. Stated that whatever pain meds the nursde gave her last time it made her go "around and around and around"

## 2018-04-25 NOTE — Discharge Instructions (Addendum)
Soft diet and take antibiotics as directed.  Follow-up closely with your primary doctor.  If you develop fevers, persistent vomiting, uncontrolled pain return to the ER over the weekend.

## 2018-04-25 NOTE — Telephone Encounter (Signed)
ED CM received call from patient concerning amoxicillin prescription patient  received upon discharge. Patient reports she is unable to take amoxicillin. CM discussed with Dr. Reather Converse, new prescription called in to patient's pharmacy for Cipro 500mg  PO BID x 7 days and Flagyl 500 TID x 7 days. Patient updated. No further ED CM needs identified.

## 2018-04-25 NOTE — ED Triage Notes (Signed)
Patient arrived from home via EMS reporting abdominal left-lower abdominal pain and dull aching pain on bilateral legs. She reports history of "Diverticula, spleen and kindney problems."

## 2018-04-25 NOTE — Telephone Encounter (Signed)
Patient called said her stomach is hurting.  Her bowels did move but still hurting? Per Dr [name] patient was told to go to the ER so they can check stat labs & do what is needed to see what might be going on.  Both patient & daughter aware

## 2018-04-26 ENCOUNTER — Other Ambulatory Visit: Payer: Self-pay | Admitting: Internal Medicine

## 2018-04-26 MED ORDER — PROMETHAZINE HCL 25 MG PO TABS: ORAL_TABLET | ORAL | 0 refills | Status: DC

## 2018-04-28 DIAGNOSIS — M67911 Unspecified disorder of synovium and tendon, right shoulder: Secondary | ICD-10-CM | POA: Diagnosis not present

## 2018-04-29 ENCOUNTER — Other Ambulatory Visit: Payer: Self-pay

## 2018-04-29 NOTE — Patient Outreach (Signed)
[facility]) Care Management  04/29/2018  Debra White [birth year] [id number]   Referral Date: 04/29/18 Referral Source: [facility] Referral Reason: trouble affording Eliquis   Outreach Attempt: spoke with patient.  She is able to verify HIPAA.  Discussed with patient reason for the referral. Patient states that her Eliquis is too expensive for her at $45.00 and wondered if she could get any assistance.  Discussed [facility] services with patient and how we could support patient.  She agrees to pharmacy only support to assist with medication.    Patient lives in the home with spouse and is independent with care. Patient admits to HTN and recent bout with diverticulitis.  Patient states she takes medication as prescribed.   Plan: RN CM will refer patient to pharmacy for medication assistance.      Jone Baseman, RN, MSN [facility] Care Management Care Management Coordinator Direct Line [phone] Toll Free: [phone]  Fax: [phone]

## 2018-05-01 ENCOUNTER — Ambulatory Visit (INDEPENDENT_AMBULATORY_CARE_PROVIDER_SITE_OTHER): Payer: PPO | Admitting: Physician Assistant

## 2018-05-01 ENCOUNTER — Other Ambulatory Visit: Payer: Self-pay | Admitting: Pharmacist

## 2018-05-01 ENCOUNTER — Encounter: Payer: Self-pay | Admitting: Physician Assistant

## 2018-05-01 ENCOUNTER — Telehealth: Payer: Self-pay

## 2018-05-01 ENCOUNTER — Ambulatory Visit: Payer: Self-pay | Admitting: Adult Health

## 2018-05-01 VITALS — BP 112/66 | HR 60 | Temp 98.2°F | Resp 14 | Ht 63.5 in | Wt 115.0 lb

## 2018-05-01 DIAGNOSIS — I700 Atherosclerosis of aorta: Secondary | ICD-10-CM | POA: Diagnosis not present

## 2018-05-01 DIAGNOSIS — J439 Emphysema, unspecified: Secondary | ICD-10-CM | POA: Diagnosis not present

## 2018-05-01 DIAGNOSIS — D735 Infarction of spleen: Secondary | ICD-10-CM

## 2018-05-01 DIAGNOSIS — E441 Mild protein-calorie malnutrition: Secondary | ICD-10-CM | POA: Diagnosis not present

## 2018-05-01 DIAGNOSIS — I771 Stricture of artery: Secondary | ICD-10-CM | POA: Diagnosis not present

## 2018-05-01 DIAGNOSIS — N280 Ischemia and infarction of kidney: Secondary | ICD-10-CM

## 2018-05-01 DIAGNOSIS — I10 Essential (primary) hypertension: Secondary | ICD-10-CM

## 2018-05-01 LAB — CBC WITH DIFFERENTIAL/PLATELET
BASOS PCT: 1.1 %
Basophils Absolute: 69 cells/uL (ref 0–200)
EOS ABS: 120 {cells}/uL (ref 15–500)
Eosinophils Relative: 1.9 %
HCT: 37.3 % (ref 35.0–45.0)
HEMOGLOBIN: 12.3 g/dL (ref 11.7–15.5)
Lymphs Abs: 1619 cells/uL (ref 850–3900)
MCH: 30.6 pg (ref 27.0–33.0)
MCHC: 33 g/dL (ref 32.0–36.0)
MCV: 92.8 fL (ref 80.0–100.0)
MPV: 10.8 fL (ref 7.5–12.5)
Monocytes Relative: 14 %
NEUTROS ABS: 3610 {cells}/uL (ref 1500–7800)
Neutrophils Relative %: 57.3 %
PLATELETS: 258 10*3/uL (ref 140–400)
RBC: 4.02 10*6/uL (ref 3.80–5.10)
RDW: 12 % (ref 11.0–15.0)
TOTAL LYMPHOCYTE: 25.7 %
WBC mixed population: 882 cells/uL (ref 200–950)
WBC: 6.3 10*3/uL (ref 3.8–10.8)

## 2018-05-01 LAB — COMPLETE METABOLIC PANEL WITH GFR
AG Ratio: 1.6 (calc) (ref 1.0–2.5)
ALBUMIN MSPROF: 4.4 g/dL (ref 3.6–5.1)
ALT: 19 U/L (ref 6–29)
AST: 27 U/L (ref 10–35)
Alkaline phosphatase (APISO): 59 U/L (ref 33–130)
BUN: 8 mg/dL (ref 7–25)
CALCIUM: 9.8 mg/dL (ref 8.6–10.4)
CHLORIDE: 96 mmol/L — AB (ref 98–110)
CO2: 29 mmol/L (ref 20–32)
Creat: 0.75 mg/dL (ref 0.60–0.88)
GFR, EST AFRICAN AMERICAN: 86 mL/min/{1.73_m2} (ref >=60)
GFR, EST NON AFRICAN AMERICAN: 74 mL/min/{1.73_m2} (ref >=60)
Globulin: 2.7 g/dL (calc) (ref 1.9–3.7)
Glucose, Bld: 92 mg/dL (ref 65–99)
Potassium: 5.4 mmol/L — ABNORMAL HIGH (ref 3.5–5.3)
Sodium: 133 mmol/L — ABNORMAL LOW (ref 135–146)
TOTAL PROTEIN: 7.1 g/dL (ref 6.1–8.1)
Total Bilirubin: 0.4 mg/dL (ref 0.2–1.2)

## 2018-05-01 NOTE — Patient Instructions (Signed)
Can do citracel or benefiber or add on fiber supplement to prevent flare of diverticulitis If you do have a flare such as left lower quadrant pain, diarrhea avoid fiber, do bowel rest which involves liquid diet at first with broth and jello and then slowly advance your diet to bland foods such as potatoes, noodles, etc and then add back in fiber later to prevent a flare.     Diverticulitis Diverticulitis is inflammation or infection of small pouches in your colon that form when you have a condition called diverticulosis. The pouches in your colon are called diverticula. Your colon, or large intestine, is where water is absorbed and stool is formed. Complications of diverticulitis can include:  Bleeding.  Severe infection.  Severe pain.  Perforation of your colon.  Obstruction of your colon.  What are the causes? Diverticulitis is caused by bacteria. Diverticulitis happens when stool becomes trapped in diverticula. This allows bacteria to grow in the diverticula, which can lead to inflammation and infection. What increases the risk? People with diverticulosis are at risk for diverticulitis. Eating a diet that does not include enough fiber from fruits and vegetables may make diverticulitis more likely to develop. What are the signs or symptoms? Symptoms of diverticulitis may include:  Abdominal pain and tenderness. The pain is normally located on the left side of the abdomen, but may occur in other areas.  Fever and chills.  Bloating.  Cramping.  Nausea.  Vomiting.  Constipation.  Diarrhea.  Blood in your stool.  How is this diagnosed? Your health care provider will ask you about your medical history and do a physical exam. You may need to have tests done because many medical conditions can cause the same symptoms as diverticulitis. Tests may include:  Blood tests.  Urine tests.  Imaging tests of the abdomen, including X-rays and CT scans.  When your condition is  under control, your health care provider may recommend that you have a colonoscopy. A colonoscopy can show how severe your diverticula are and whether something else is causing your symptoms. How is this treated? Most cases of diverticulitis are mild and can be treated at home. Treatment may include:  Taking over-the-counter pain medicines.  Following a clear liquid diet.  Taking antibiotic medicines by mouth for 7-10 days.  More severe cases may be treated at a hospital. Treatment may include:  Not eating or drinking.  Taking prescription pain medicine.  Receiving antibiotic medicines through an IV tube.  Receiving fluids and nutrition through an IV tube.  Surgery.  Follow these instructions at home:  Follow your health care provider's instructions carefully.  Follow a full liquid diet or other diet as directed by your health care provider. After your symptoms improve, your health care provider may tell you to change your diet. He or she may recommend you eat a high-fiber diet. Fruits and vegetables are good sources of fiber. Fiber makes it easier to pass stool.  Take fiber supplements or probiotics as directed by your health care provider.  Only take medicines as directed by your health care provider.  Keep all your follow-up appointments. Contact a health care provider if:  Your pain does not improve.  You have a hard time eating food.  Your bowel movements do not return to normal. Get help right away if:  Your pain becomes worse.  Your symptoms do not get better.  Your symptoms suddenly get worse.  You have a fever.  You have repeated vomiting.  You have bloody  or black, tarry stools. This information is not intended to replace advice given to you by your health care provider. Make sure you discuss any questions you have with your health care provider. Document Released: 03/21/2005 Document Revised: 11/17/2015 Document Reviewed: 05/06/2013 Elsevier  Interactive Patient Education  2017 [employer].

## 2018-05-01 NOTE — Progress Notes (Signed)
Assessment and Plan:  Essential hypertension - continue medications, DASH diet, exercise and monitor at home. Call if greater than 130/80.  -     CBC with Differential/Platelet -     COMPLETE METABOLIC PANEL WITH GFR  Tortuous aorta (HCC) Control blood pressure, cholesterol, glucose, increase exercise.   Aortic atherosclerosis ([place]) -     Ambulatory referral to Cardiology Control blood pressure, cholesterol, glucose, increase exercise.   Renal infarct (HCC)/splenic infarct -     Ambulatory referral to Cardiology -     CBC with Differential/Platelet -     COMPLETE METABOLIC PANEL WITH GFR - will refer to cardiology for possible loop recorder/evaluation - continue 5 mg BID until after 6 months of initial treatment and then we can reduce to 2.5 mg BID due to weight.  - working on patient assistance.   Pulmonary emphysema, unspecified emphysema type ([place]) Monitor symptoms  Mild malnutrition [facility]) Add ensure    Future Appointments  Date Time Provider [place]  05/07/2018  9:00 AM Willia Craze, NP [facility]  06/02/2018 10:30 AM Unk Pinto, MD [facility] None  12/09/2018  9:00 AM Unk Pinto, MD [facility] None    HPI 84 y.o.female has Headache, post-traumatic, chronic; Hypertension; Hyperlipidemia; GERD; Vitamin D deficiency; Atypical facial pain; Medication management; Encounter for Medicare annual wellness exam; Mild malnutrition ([place]); COPD (chronic obstructive pulmonary disease) with emphysema ([place]); Tortuous aorta ([place]); Osteoporosis; Aortic atherosclerosis ([place]); Hepatic steatosis; Splenic infarct; Renal infarct ([place]); Chronic hyponatremia; Chronic hyperkalemia; Right sided numbness; and Coronary atherosclerosis due to lipid rich plaque on their problem list. presents for follow up of ER visit for diverticulitis.   She has had multiple admissions to the hospital recently due to a splenic infarct and divertculitis. She has GI follow up  11/13. She is still on the antibiotics and states she is not feeling good.   She has not followed up with cardiology, she did a holter monitor 10/10 but states it made her itch and she only wore it short term. She has not heard anything from them. She has not had any palpitations with the atenolol she is on. She is on elliquis 5 mg twice a day, she is on this for anticoagulation dose, while she is just under 60 kg, we will keep her on the theraputic dose for at least 6 months and is having a difficult time affording it, she has been in touch with [facility] and they are working to help make it cheaper. We will also try to fill out the form for bristol myers squibb.   She has her "normal headache" and back pain. She is sluggish, tired. She could not tolerate phentermine but she has not needed it. She is eating, she has no AB pain, no diarrhea/constipation.   Blood pressure 112/66, pulse 60, temperature 98.2 F (36.8 C), resp. rate 14, height 5' 3.5" (1.613 m), weight 115 lb (52.2 kg), SpO2 98 %.    Past Medical History:  Diagnosis Date  . Anxiety   . Arthritis   . Blood transfusion   . C. difficile colitis   . Candida esophagitis ([place]) 2013   EGD  . Cataract   . Chronic kidney disease    STONES  . Diverticulitis 04/05/2017  . Diverticulosis of colon (without mention of hemorrhage) 2007   Colonoscopy  . Dysrhythmia    RX  . Emphysema of lung ([place])   . Family history of colon cancer    sister  . Fibromyalgia   .  Fracture, zygoma closed ([place]) 07/13/2011  . GERD (gastroesophageal reflux disease)   . Headache(784.0)   . Hyperlipemia   . Hypertension   . Kidney stones   . Ptosis, bilateral   . Renal infarct ([place])   . Splenic infarct      Allergies  Allergen Reactions  . Latex Hives    TONGUE HAD BLISTERS WHEN HAD DENTAL SURGERY  . Amoxicillin Nausea And Vomiting  . Aspirin Other (See Comments)    Gi upset  . Barbiturates Other (See Comments)    Unknown reaction  . Celebrex  [Celecoxib] Other (See Comments)    Unknown reaction  . Codeine Itching  . Evista [Raloxifene] Other (See Comments)    Unknown reaction  . Lipitor [Atorvastatin] Other (See Comments)    Unknown reaction  . Morphine And Related Other (See Comments)    Unknown reaction  . Oruvail [Ketoprofen] Other (See Comments)    Unknown reaction  . Pantoprazole Diarrhea  . Paraffin Other (See Comments)    Unknown reaction  . Prochlorperazine Other (See Comments)    Uncontrolled shaking  . Proloprim [Trimethoprim] Other (See Comments)    Unknown reaction  . Venlafaxine Nausea Only  . Vibra-Tab [Doxycycline] Other (See Comments)    Unknown reaction  . Vioxx [Rofecoxib] Other (See Comments)    edema  . Betadine [Povidone Iodine] Rash  . Caffeine Palpitations    Current Outpatient Medications on File Prior to Visit  Medication Sig  . acetaminophen (TYLENOL) 500 MG tablet Take 500-1,000 mg by mouth every 6 (six) hours as needed for headache.  Marland Kitchen apixaban (ELIQUIS) 5 MG TABS tablet Take 1 tablet (5 mg total) by mouth 2 (two) times daily.  Marland Kitchen atenolol (TENORMIN) 100 MG tablet TAKE 1 TABLET BY MOUTH DAILY FOR BLOOD PRESSURE (Patient taking differently: Take 50 mg by mouth 2 (two) times daily. )  . Cholecalciferol (VITAMIN D) 2000 units tablet Take 4,000 Units by mouth daily.   . clonazePAM (KLONOPIN) 1 MG tablet TAKE 1/2 TO 1 TABLET BY MOUTH 2-3 TIMES DAILY ONLY IF NEEDED FOR ANXIETY ATTACK AND LIMIT TO 5 DAYS PER WEEK TO AVOID ADDICTION (Patient taking differently: Take 0.5 mg by mouth 2 (two) times daily. )  . diclofenac sodium (VOLTAREN) 1 % GEL Apply 2 g topically 4 (four) times daily.  Marland Kitchen dicyclomine (BENTYL) 10 MG capsule Take 10 mg by mouth daily.  . famotidine (PEPCID) 40 MG tablet Take 1 tablet (40 mg total) by mouth daily.  Marland Kitchen gabapentin (NEURONTIN) 600 MG tablet Take 1/2 to 1 tablet s 3 to 4 x / day as needed for Facial Pain (Patient taking differently: Take 300-600 mg by mouth See admin  instructions. Take 300 mg in the morning, 300 mg at 1400 and 600 mg at bedtime for facial pain)  . HYDROcodone-acetaminophen (NORCO) 5-325 MG tablet Take 1 tablet by mouth every 6 (six) hours as needed for severe pain.  Marland Kitchen losartan (COZAAR) 100 MG tablet TAKE 1/2 TABLET BY MOUTH DAILY (Patient taking differently: Take 50 mg by mouth every evening. )  . ondansetron (ZOFRAN) 8 MG tablet Take 1 tablet 3 x  /day if needed for Nausea  . pravastatin (PRAVACHOL) 40 MG tablet Take 0.5 tablets (20 mg total) by mouth every evening.  . promethazine (PHENERGAN) 25 MG tablet Take 1 or 2 tablets 4 x /day if needed for Nausea  . traMADol (ULTRAM) 50 MG tablet TAKE 1 TABLET BY MOUTH 4 TIMES DAILY AS NEEDED FOR PAIN (Patient taking  differently: Take 50 mg by mouth 4 (four) times daily as needed (pain). )  . [DISCONTINUED] omeprazole (PRILOSEC OTC) 20 MG tablet Take 20 mg by mouth daily.   No current facility-administered medications on file prior to visit.     ROS: all negative except above.   Physical Exam: Filed Weights   05/01/18 1047  Weight: 115 lb (52.2 kg)   BP 112/66   Pulse 60   Temp 98.2 F (36.8 C)   Resp 14   Ht 5' 3.5" (1.613 m)   Wt 115 lb (52.2 kg)   SpO2 98%   BMI 20.05 kg/m  General Appearance: Thin,frail, in no apparent distress. Eyes: PERRLA, EOMs, conjunctiva no swelling or erythema Sinuses: No Frontal/maxillary tenderness ENT/Mouth: Ext aud canals clear, TMs without erythema, bulging. No erythema, swelling, or exudate on post pharynx.  Tonsils not swollen or erythematous. Hearing normal. + tenderness right temple/head Neck: Supple, thyroid normal.  Respiratory: Respiratory effort normal, BS equal bilaterally without rales, rhonchi, wheezing or stridor.  Cardio: RRR with no MRGs. Brisk peripheral pulses without edema.  Abdomen: Soft, + BS.  Mild left lower quadrant tender, no guarding, no rebound, hernias, masses. Lymphatics: Non tender without lymphadenopathy.    Musculoskeletal: Full ROM, 5/5 strength, normal gait.  Skin: Warm, dry without rashes, lesions, ecchymosis.  Neuro: Cranial nerves intact. Normal muscle tone, no cerebellar symptoms. Sensation intact.  Psych: Awake and oriented X 3, normal affect, Insight and Judgment appropriate.   Vicie Mutters, PA-C 12:21 PM [facility]

## 2018-05-01 NOTE — Telephone Encounter (Signed)
[place] form has been place in the mail for the patient to fill & return to our office.

## 2018-05-01 NOTE — Patient Outreach (Signed)
[facility]) Care Management  [place]   05/01/2018  Debra White 1933-10-18 [id number]  Reason for referral: medication assistance  Referral source: HTA Referral medication(s): Eliquis Current insurance:HTA  PMHx: Recent hospitalization 9/28-9/30/2019 for subacute/chronic splenic and right renal infarcts started on apixaban, re-hospitalized 10/15-10/19 with diverticulitis.  Also hx CVA, possible Afib scheduled for out-patient cardiac event monitoring, HTN, HLD, pre-diabetes, vitamin D deficiency, GERD, diverticulitis, CKD, fibromylagia.    Noted PCP office mailed patient PAP application for Eliquis via BMS, gave patient samples.    Outreach: Successful call to Debra White today. HIPAA identifiers verified. Patient reports she has a co-pay of $45 for Eliquis which is difficult for her to afford.  She reports she applied for Extra Help recently but was told she and her spouse did not qualify based on income. She is currently using samples from MD.     Objective: Allergies  Allergen Reactions  . Latex Hives    TONGUE HAD BLISTERS WHEN HAD DENTAL SURGERY  . Amoxicillin Nausea And Vomiting  . Aspirin Other (See Comments)    Gi upset  . Barbiturates Other (See Comments)    Unknown reaction  . Celebrex [Celecoxib] Other (See Comments)    Unknown reaction  . Codeine Itching  . Evista [Raloxifene] Other (See Comments)    Unknown reaction  . Lipitor [Atorvastatin] Other (See Comments)    Unknown reaction  . Morphine And Related Other (See Comments)    Unknown reaction  . Oruvail [Ketoprofen] Other (See Comments)    Unknown reaction  . Pantoprazole Diarrhea  . Paraffin Other (See Comments)    Unknown reaction  . Prochlorperazine Other (See Comments)    Uncontrolled shaking  . Proloprim [Trimethoprim] Other (See Comments)    Unknown reaction  . Venlafaxine Nausea Only  . Vibra-Tab [Doxycycline] Other (See Comments)    Unknown reaction  . Vioxx  [Rofecoxib] Other (See Comments)    edema  . Betadine [Povidone Iodine] Rash  . Caffeine Palpitations    Medications Reviewed Today    Reviewed by [name], CMA (Certified Medical Assistant) on 05/01/18 at 1050  Med List Status: <None>  Medication Order Taking? Sig Documenting Provider Last Dose Status Informant  acetaminophen (TYLENOL) 500 MG tablet [id number] Yes Take 500-1,000 mg by mouth every 6 (six) hours as needed for headache. [provider] Taking Active Pharmacy Records       Patient not taking:       Discontinued 05/01/18 1049 (Completed Course)   apixaban (ELIQUIS) 5 MG TABS tablet [id number] Yes Take 1 tablet (5 mg total) by mouth 2 (two) times daily. Alma Friendly, MD Taking Active   atenolol (TENORMIN) 100 MG tablet [id number] Yes TAKE 1 TABLET BY MOUTH DAILY FOR BLOOD PRESSURE  Patient taking differently:  Take 50 mg by mouth 2 (two) times daily.    Vicie Mutters, PA-C Taking Active Pharmacy Records           Med Note Gentry Roch   Tue Apr 08, 2018 11:50 AM)    Cholecalciferol (VITAMIN D) 2000 units tablet [id number] Yes Take 4,000 Units by mouth daily.  [provider] Taking Active Pharmacy Records  clonazePAM (KLONOPIN) 1 MG tablet [id number] Yes TAKE 1/2 TO 1 TABLET BY MOUTH 2-3 TIMES DAILY ONLY IF NEEDED FOR ANXIETY ATTACK AND LIMIT TO 5 DAYS PER WEEK TO AVOID ADDICTION  Patient taking differently:  Take 0.5 mg by mouth 2 (two) times daily.    Unk Pinto,  MD Taking Active Pharmacy Records           Med Note Payton Doughty   IRJ Mar 22, 2018  6:44 PM) #90/30 days filled 03/20/18 [facility] per PMP AWARE  diclofenac sodium (VOLTAREN) 1 % GEL [id number] Yes Apply 2 g topically 4 (four) times daily. Geradine Girt, DO Taking Active Pharmacy Records  dicyclomine (BENTYL) 10 MG capsule [id number] Yes Take 10 mg by mouth daily. [provider] Taking Active Pharmacy Records  famotidine (PEPCID) 40 MG tablet  [id number] Yes Take 1 tablet (40 mg total) by mouth daily. Unk Pinto, MD Taking Active Pharmacy Records  fluconazole [facility]) 100 MG tablet [id number] Yes Take 1 tablet daily for yeast infection Unk Pinto, MD Taking Active Pharmacy Records           Med Note Gentry Roch   Tue Apr 08, 2018 10:46 AM) [facility] 04/04/18 for 7 days  gabapentin (NEURONTIN) 600 MG tablet [id number] Yes Take 1/2 to 1 tablet s 3 to 4 x / day as needed for Facial Pain  Patient taking differently:  Take 300-600 mg by mouth See admin instructions. Take 300 mg in the morning, 300 mg at 1400 and 600 mg at bedtime for facial pain   Unk Pinto, MD Taking Active Pharmacy Records           Med Note Gentry Roch   Tue Apr 08, 2018 11:47 AM) Last filled 11/14/17 for 90 days  HYDROcodone-acetaminophen (NORCO) 5-325 MG tablet [id number] Yes Take 1 tablet by mouth every 6 (six) hours as needed for severe pain. Elnora Morrison, MD Taking Active        Patient not taking:       Discontinued 05/01/18 1050 (Completed Course)   losartan (COZAAR) 100 MG tablet [id number] Yes TAKE 1/2 TABLET BY MOUTH DAILY  Patient taking differently:  Take 50 mg by mouth every evening.    Unk Pinto, MD Taking Active Pharmacy Records        Discontinued 09/01/11 1134 (Patient has not taken in last 30 days)   ondansetron (ZOFRAN) 8 MG tablet [id number] Yes Take 1 tablet 3 x  /day if needed for Nausea Unk Pinto, MD Taking Active   pravastatin (PRAVACHOL) 40 MG tablet [id number] Yes Take 0.5 tablets (20 mg total) by mouth every evening. Geradine Girt, DO Taking Active Pharmacy Records  promethazine (PHENERGAN) 25 MG tablet [id number] Yes Take 1 or 2 tablets 4 x /day if needed for Nausea Unk Pinto, MD Taking Active   traMADol Veatrice Bourbon) 50 MG tablet [id number] Yes TAKE 1 TABLET BY MOUTH 4 TIMES DAILY AS NEEDED FOR PAIN  Patient taking differently:  Take 50 mg by mouth 4 (four) times daily as needed (pain).     Vicie Mutters, PA-C Taking Active Pharmacy Records           Med Note Payton Doughty   NID Mar 22, 2018  6:45 PM) #60 filled 11/21/17 [facility] per PMP AWARE          Medication Assistance Findings:   TROOP: $309.51 (05/01/2018) per HTA  Extra Help:   '[]'$  Already receiving Full Extra Help  '[]'$  Already receiving Partial Extra Help  '[]'$  Eligible based on reported income and assets  '[x]'$  Not Eligible based on reported income and assets  -Patient reports she applied in the past but did not qualify.  Based on reported income today, patient is just over the cut-off for income eligibility unforunately.  I  reviewed program with patient who voiced understanding.   Patient Assistance Programs: 1) Eliquis made by BMS o Income requirement met: '[x]'$  Yes '[]'$  No '[]'$  Unknown o Out-of-pocket prescription expenditure met:    '[]'$  Yes '[x]'$  No  '[]'$  Unknown  '[]'$  Not applicable - Needs to spent at least $350 more to meet 3% out of pocket expenditure based on reported income   Additional medication assistance options reviewed with patient as warranted:  Samples from [facility]  Plan: Will close patient case as patient not eligible for patient assistance through Extra Help or PAP. Advised patient to continue to request samples from [facility] office.  Patient has been provided [facility] CM contact information if assistance needed in the future.    Thank you for allowing [facility] pharmacy to be involved in this patient's care.    Ralene Bathe, PharmD, [place] [phone]

## 2018-05-07 ENCOUNTER — Ambulatory Visit: Payer: PPO | Admitting: Nurse Practitioner

## 2018-05-12 ENCOUNTER — Other Ambulatory Visit: Payer: Self-pay

## 2018-05-12 MED ORDER — APIXABAN 5 MG PO TABS: 5.0000 mg | ORAL_TABLET | Freq: Two times a day (BID) | ORAL | 0 refills | Status: DC

## 2018-05-15 ENCOUNTER — Other Ambulatory Visit: Payer: Self-pay | Admitting: Internal Medicine

## 2018-05-19 ENCOUNTER — Ambulatory Visit (INDEPENDENT_AMBULATORY_CARE_PROVIDER_SITE_OTHER): Payer: PPO | Admitting: Adult Health

## 2018-05-19 ENCOUNTER — Encounter: Payer: Self-pay | Admitting: Adult Health

## 2018-05-19 VITALS — BP 140/76 | HR 63 | Temp 97.3°F | Ht 63.5 in | Wt 115.0 lb

## 2018-05-19 DIAGNOSIS — R1084 Generalized abdominal pain: Secondary | ICD-10-CM | POA: Diagnosis not present

## 2018-05-19 NOTE — Patient Instructions (Signed)
Pick up miralax, or can try senokot for constipation   Simethicone is for bloating  If having problems with acid reflex pick up omeprazole over the counter and can add to current famotidine   Call me if you still don't have a bowel movement in the next few days    Constipation, Adult Constipation is when a person:  Poops (has a bowel movement) fewer times in a week than normal.  Has a hard time pooping.  Has poop that is dry, hard, or bigger than normal.  Follow these instructions at home: Eating and drinking   Eat foods that have a lot of fiber, such as: ? Fresh fruits and vegetables. ? Whole grains. ? Beans.  Eat less of foods that are high in fat, low in fiber, or overly processed, such as: ? [place] fries. ? Hamburgers. ? Cookies. ? Candy. ? Soda.  Drink enough fluid to keep your pee (urine) clear or pale yellow. General instructions  Exercise regularly or as told by your doctor.  Go to the restroom when you feel like you need to poop. Do not hold it in.  Take over-the-counter and prescription medicines only as told by your doctor. These include any fiber supplements.  Do pelvic floor retraining exercises, such as: ? Doing deep breathing while relaxing your lower belly (abdomen). ? Relaxing your pelvic floor while pooping.  Watch your condition for any changes.  Keep all follow-up visits as told by your doctor. This is important. Contact a doctor if:  You have pain that gets worse.  You have a fever.  You have not pooped for 4 days.  You throw up (vomit).  You are not hungry.  You lose weight.  You are bleeding from the anus.  You have thin, pencil-like poop (stool). Get help right away if:  You have a fever, and your symptoms suddenly get worse.  You leak poop or have blood in your poop.  Your belly feels hard or bigger than normal (is bloated).  You have very bad belly pain.  You feel dizzy or you faint. This information is not  intended to replace advice given to you by your health care provider. Make sure you discuss any questions you have with your health care provider. Document Released: 11/28/2007 Document Revised: 12/30/2015 Document Reviewed: 11/30/2015 Elsevier Interactive Patient Education  2018 [employer].

## 2018-05-19 NOTE — Progress Notes (Signed)
Assessment and Plan:  Debra White was seen today for sinus problem and abdominal pain.  Diagnoses and all orders for this visit:  Generalized abdominal discomfort Vague mild symptoms, check basic labs, will work on bowel management for constipation Miralax, senokot, simethicone recommended, follow up if no BM in 1-2 days Follow up as scheduled with GI or if not improving ER for severe symptoms -     CBC with Differential/Platelet -     COMPLETE METABOLIC PANEL WITH GFR -     Urinalysis w microscopic + reflex cultur  Further disposition pending results of labs. Discussed med's effects and SE's.   Over 15 minutes of exam, counseling, chart review, and critical decision making was performed.   Future Appointments  Date Time Provider [place]  05/28/2018  9:20 AM Skeet Latch, MD [facility]  06/02/2018 10:30 AM Unk Pinto, MD GA[facility] None  06/09/2018  2:00 PM Ladene Artist, MD [facility]  12/09/2018  9:00 AM Unk Pinto, MD [facility] None  05/08/2019 10:30 AM Vicie Mutters, PA-C [facility] None    ------------------------------------------------------------------------------------------------------------------   HPI Ht 5' 3.5" (1.613 m)   Wt 115 lb (52.2 kg)   BMI 20.05 kg/m   84 y.o.female with hx of GERD, heptatic steatosis, recent hx of diverticulitis, 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 She also endorses having some nausea. Currently without symptoms. She reports pain was non-localized, describes as "just hurting" and couldn't sleep. She denies severe pain, "or I would have gone to the ER." She denies fever/chills, urinary changes; she does endorse constipation for a few days, had nausea but improved with zofran.   She has ongoing GI issues, has upcoming appointment with GI on 12/16; today she endorses some  bloating and constipation. Not currently taking anything for constipation.   Past Medical History:  Diagnosis Date  . Anxiety   . Arthritis   . Blood transfusion   . C. difficile colitis   . Candida esophagitis ([place]) 2013   EGD  . Cataract   . Chronic kidney disease    STONES  . Diverticulitis 04/05/2017  . Diverticulosis of colon (without mention of hemorrhage) 2007   Colonoscopy  . Dysrhythmia    RX  . Emphysema of lung ([place])   . Family history of colon cancer    sister  . Fibromyalgia   . Fracture, zygoma closed ([place]) 07/13/2011  . GERD (gastroesophageal reflux disease)   . Headache(784.0)   . Hyperlipemia   . Hypertension   . Kidney stones   . Ptosis, bilateral   . Renal infarct ([place])   . Splenic infarct      Allergies  Allergen Reactions  . Latex Hives    TONGUE HAD BLISTERS WHEN HAD DENTAL SURGERY  . Amoxicillin Nausea And Vomiting  . Aspirin Other (See Comments)    Gi upset  . Barbiturates Other (See Comments)    Unknown reaction  . Celebrex [Celecoxib] Other (See Comments)    Unknown reaction  . Codeine Itching  . Evista [Raloxifene] Other (See Comments)    Unknown reaction  . Lipitor [Atorvastatin] Other (See Comments)    Unknown reaction  . Morphine And Related Other (See Comments)    Unknown reaction  . Oruvail [Ketoprofen] Other (See Comments)    Unknown reaction  . Pantoprazole Diarrhea  . Paraffin Other (See Comments)    Unknown reaction  . Prochlorperazine Other (  See Comments)    Uncontrolled shaking  . Proloprim [Trimethoprim] Other (See Comments)    Unknown reaction  . Venlafaxine Nausea Only  . Vibra-Tab [Doxycycline] Other (See Comments)    Unknown reaction  . Vioxx [Rofecoxib] Other (See Comments)    edema  . Betadine [Povidone Iodine] Rash  . Caffeine Palpitations    Current Outpatient Medications on File Prior to Visit  Medication Sig  . acetaminophen (TYLENOL) 500 MG tablet Take 500-1,000 mg by mouth every 6 (six) hours as  needed for headache.  Marland Kitchen apixaban (ELIQUIS) 5 MG TABS tablet Take 1 tablet (5 mg total) by mouth 2 (two) times daily.  Marland Kitchen atenolol (TENORMIN) 100 MG tablet TAKE 1 TABLET BY MOUTH DAILY FOR BLOOD PRESSURE (Patient taking differently: Take 50 mg by mouth 2 (two) times daily. )  . Cholecalciferol (VITAMIN D) 2000 units tablet Take 4,000 Units by mouth daily.   . clonazePAM (KLONOPIN) 1 MG tablet TAKE 1/2 TO 1 TABLET BY MOUTH TWO TO THREE TIMES DAILY ONLY IF NEEDEDFOR ANXIETY ATTACK AND LIMIT TO 5 DAYS PER WEEK TO AVOID ADDICTION  . diclofenac sodium (VOLTAREN) 1 % GEL Apply 2 g topically 4 (four) times daily.  Marland Kitchen dicyclomine (BENTYL) 10 MG capsule Take 10 mg by mouth daily.  . famotidine (PEPCID) 40 MG tablet Take 1 tablet (40 mg total) by mouth daily.  Marland Kitchen gabapentin (NEURONTIN) 600 MG tablet Take 1/2 to 1 tablet s 3 to 4 x / day as needed for Facial Pain (Patient taking differently: Take 300-600 mg by mouth See admin instructions. Take 300 mg in the morning, 300 mg at 1400 and 600 mg at bedtime for facial pain)  . HYDROcodone-acetaminophen (NORCO) 5-325 MG tablet Take 1 tablet by mouth every 6 (six) hours as needed for severe pain.  Marland Kitchen losartan (COZAAR) 100 MG tablet Take 1 tablet daily for BP (Patient taking differently: Take 1/2 tablet daily for BP)  . ondansetron (ZOFRAN) 8 MG tablet Take 1 tablet 3 x  /day if needed for Nausea  . pravastatin (PRAVACHOL) 40 MG tablet Take 0.5 tablets (20 mg total) by mouth every evening.  . promethazine (PHENERGAN) 25 MG tablet Take 1 or 2 tablets 4 x /day if needed for Nausea  . traMADol (ULTRAM) 50 MG tablet TAKE 1 TABLET BY MOUTH 4 TIMES DAILY AS NEEDED FOR PAIN (Patient taking differently: Take 50 mg by mouth 4 (four) times daily as needed (pain). )  . [DISCONTINUED] omeprazole (PRILOSEC OTC) 20 MG tablet Take 20 mg by mouth daily.   No current facility-administered medications on file prior to visit.     ROS: Review of Systems  Constitutional: Negative for  chills, fever, malaise/fatigue and weight loss.  HENT: Positive for sinus pain (chronic). Negative for congestion, ear discharge, ear pain, hearing loss, nosebleeds, sore throat and tinnitus.   Eyes: Negative for blurred vision.  Respiratory: Negative for cough, shortness of breath, wheezing and stridor.   Cardiovascular: Negative for chest pain, palpitations and leg swelling.  Gastrointestinal: Positive for abdominal pain, constipation and nausea. Negative for blood in stool, diarrhea, heartburn, melena and vomiting.  Genitourinary: Negative for dysuria, flank pain, frequency, hematuria and urgency.  Skin: Negative for rash.  Neurological: Negative for dizziness and headaches.     Physical Exam:  Ht 5' 3.5" (1.613 m)   Wt 115 lb (52.2 kg)   BMI 20.05 kg/m   General Appearance: Well nourished, in no apparent distress. Eyes: PERRLA,  conjunctiva no swelling or erythema Sinuses:  No Frontal/maxillary tenderness ENT/Mouth: Ext aud canals clear, TMs without erythema, bulging. No erythema, swelling, or exudate on post pharynx.  Tonsils not swollen or erythematous. Hearing normal.  Neck: Supple, thyroid normal.  Respiratory: Respiratory effort normal, BS equal bilaterally without rales, rhonchi, wheezing or stridor.  Cardio: RRR with no MRGs. Brisk peripheral pulses without edema.  Abdomen: Soft, + BS.  Vaguely tender, non-localized, no guarding, rebound, hernias, masses. Lymphatics: Non tender without lymphadenopathy.  Musculoskeletal: Full ROM, 5/5 strength, normal gait.  Skin: Warm, dry without rashes, lesions, ecchymosis.  Neuro: Cranial nerves intact. Normal muscle tone, no cerebellar symptoms. Sensation intact.  Psych: Awake and oriented X 3, normal affect, Insight and Judgment appropriate.     Izora Ribas, NP 9:44 AM [facility]

## 2018-05-20 LAB — CBC WITH DIFFERENTIAL/PLATELET
BASOS ABS: 41 {cells}/uL (ref 0–200)
Basophils Relative: 0.7 %
EOS ABS: 70 {cells}/uL (ref 15–500)
EOS PCT: 1.2 %
HEMATOCRIT: 36.5 % (ref 35.0–45.0)
HEMOGLOBIN: 12.2 g/dL (ref 11.7–15.5)
Lymphs Abs: 1583 cells/uL (ref 850–3900)
MCH: 30.6 pg (ref 27.0–33.0)
MCHC: 33.4 g/dL (ref 32.0–36.0)
MCV: 91.5 fL (ref 80.0–100.0)
MPV: 10.7 fL (ref 7.5–12.5)
Monocytes Relative: 11.8 %
Neutro Abs: 3422 cells/uL (ref 1500–7800)
Neutrophils Relative %: 59 %
Platelets: 244 10*3/uL (ref 140–400)
RBC: 3.99 10*6/uL (ref 3.80–5.10)
RDW: 12.1 % (ref 11.0–15.0)
Total Lymphocyte: 27.3 %
WBC: 5.8 10*3/uL (ref 3.8–10.8)
WBCMIX: 684 {cells}/uL (ref 200–950)

## 2018-05-20 LAB — COMPLETE METABOLIC PANEL WITH GFR
AG RATIO: 1.8 (calc) (ref 1.0–2.5)
ALBUMIN MSPROF: 4.6 g/dL (ref 3.6–5.1)
ALT: 10 U/L (ref 6–29)
AST: 17 U/L (ref 10–35)
Alkaline phosphatase (APISO): 63 U/L (ref 33–130)
BUN: 7 mg/dL (ref 7–25)
CALCIUM: 9.9 mg/dL (ref 8.6–10.4)
CO2: 27 mmol/L (ref 20–32)
Chloride: 93 mmol/L — ABNORMAL LOW (ref 98–110)
Creat: 0.66 mg/dL (ref 0.60–0.88)
GFR, EST NON AFRICAN AMERICAN: 82 mL/min/{1.73_m2} (ref >=60)
GFR, Est African American: 95 mL/min/{1.73_m2} (ref >=60)
GLOBULIN: 2.5 g/dL (ref 1.9–3.7)
Glucose, Bld: 94 mg/dL (ref 65–99)
POTASSIUM: 4.9 mmol/L (ref 3.5–5.3)
SODIUM: 132 mmol/L — AB (ref 135–146)
Total Bilirubin: 0.6 mg/dL (ref 0.2–1.2)
Total Protein: 7.1 g/dL (ref 6.1–8.1)

## 2018-05-20 LAB — URINALYSIS W MICROSCOPIC + REFLEX CULTURE
Bacteria, UA: NONE SEEN /HPF
Bilirubin Urine: NEGATIVE
GLUCOSE, UA: NEGATIVE
HYALINE CAST: NONE SEEN /LPF
Hgb urine dipstick: NEGATIVE
Ketones, ur: NEGATIVE
Leukocyte Esterase: NEGATIVE
Nitrites, Initial: NEGATIVE
PROTEIN: NEGATIVE
RBC / HPF: NONE SEEN /HPF (ref 0–2)
SPECIFIC GRAVITY, URINE: 1.005 (ref 1.001–1.03)
Squamous Epithelial / LPF: NONE SEEN /HPF (ref <=5)
WBC, UA: NONE SEEN /HPF (ref 0–5)
pH: 6.5 (ref 5.0–8.0)

## 2018-05-20 LAB — NO CULTURE INDICATED

## 2018-05-26 ENCOUNTER — Other Ambulatory Visit: Payer: Self-pay | Admitting: Internal Medicine

## 2018-05-26 DIAGNOSIS — I10 Essential (primary) hypertension: Secondary | ICD-10-CM

## 2018-05-26 MED ORDER — OLMESARTAN MEDOXOMIL 40 MG PO TABS: ORAL_TABLET | ORAL | 3 refills | Status: DC

## 2018-05-28 ENCOUNTER — Encounter: Payer: Self-pay | Admitting: Cardiovascular Disease

## 2018-05-28 ENCOUNTER — Ambulatory Visit (INDEPENDENT_AMBULATORY_CARE_PROVIDER_SITE_OTHER): Payer: PPO | Admitting: Cardiovascular Disease

## 2018-05-28 VITALS — BP 139/70 | HR 52 | Ht 64.0 in | Wt 113.8 lb

## 2018-05-28 DIAGNOSIS — Z5181 Encounter for therapeutic drug level monitoring: Secondary | ICD-10-CM | POA: Diagnosis not present

## 2018-05-28 DIAGNOSIS — D735 Infarction of spleen: Secondary | ICD-10-CM

## 2018-05-28 DIAGNOSIS — I700 Atherosclerosis of aorta: Secondary | ICD-10-CM

## 2018-05-28 DIAGNOSIS — E7800 Pure hypercholesterolemia, unspecified: Secondary | ICD-10-CM

## 2018-05-28 DIAGNOSIS — N280 Ischemia and infarction of kidney: Secondary | ICD-10-CM

## 2018-05-28 DIAGNOSIS — Z01812 Encounter for preprocedural laboratory examination: Secondary | ICD-10-CM

## 2018-05-28 NOTE — Patient Instructions (Addendum)
Medication Instructions:  Your physician recommends that you continue on your current medications as directed. Please refer to the Current Medication list given to you today.  If you need a refill on your cardiac medications before your next appointment, please call your pharmacy.   Lab work: CBC/BMET FEW DAYS PRIOR TO TEE If you have labs (blood work) drawn today and your tests are completely normal, you will receive your results only by: Marland Kitchen MyChart Message (if you have MyChart) OR . A paper copy in the mail If you have any lab test that is abnormal or we need to change your treatment, we will call you to review the results.  Testing/Procedures: TEE in January   Follow-Up: At [facility], you and your health needs are our priority.  As part of our continuing mission to provide you with exceptional heart care, we have created designated Provider Care Teams.  These Care Teams include your primary Cardiologist (physician) and Advanced Practice Providers (APPs -  Physician Assistants and Nurse Practitioners) who all work together to provide you with the care you need, when you need it. You will need a follow up appointment after TEE   You may see DR [facility]  or one of the following Advanced Practice Providers on your designated Care Team:   Kerin Ransom, PA-C Roby Lofts, [place] . Sande Rives, PA-C  Any Other Special Instructions Will Be Listed Below (If Applicable).  You are scheduled for a TEE on 07/01/2018 with Dr. Acie Fredrickson.  Please arrive at the [facility] (Main Entrance A) at [facility]: [address] [place],  [id number] at 8 am. (1 hour prior to procedure unless lab work is needed; if lab work is needed arrive 1.5 hours ahead)  DIET: Nothing to eat or drink after midnight except a sip of water with medications (see medication instructions below)  Medication Instructions:  Continue your anticoagulant: ELIQUIS  You will need to continue your anticoagulant after  your procedure until you are told by your  Provider that it is safe to stop   Labs: CBC/BMET TO BE DONE WEEK BEFORE TEE  Come to: [place] TEE You must have a responsible person to drive you home and stay in the waiting area during your procedure. Failure to do so could result in cancellation.  Bring your insurance cards.  *Special Note: Every effort is made to have your procedure done on time. Occasionally there are emergencies that occur at the hospital that may cause delays. Please be patient if a delay does occur.    Transesophageal Echocardiogram Transesophageal echocardiography (TEE) is a picture test of your heart using sound waves. The pictures taken can give very detailed pictures of your heart. This can help your doctor see if there are problems with your heart. TEE can check:  If your heart has blood clots in it.  How well your heart valves are working.  If you have an infection on the inside of your heart.  Some of the major arteries of your heart.  If your heart valve is working after a Office manager.  Your heart before a procedure that uses a shock to your heart to get the rhythm back to normal.  What happens before the procedure?  Do not eat or drink for 6 hours before the procedure or as told by your doctor.  Make plans to have someone drive you home after the procedure. Do not drive yourself home.  An IV tube will be put  in your arm. What happens during the procedure?  You will be given a medicine to help you relax (sedative). It will be given through the IV tube.  A numbing medicine will be sprayed or gargled in the back of your throat to help numb it.  The tip of the probe is placed into the back of your mouth. You will be asked to swallow. This helps to pass the probe into your esophagus.  Once the tip of the probe is in the right place, your doctor can take pictures of your heart.  You may feel pressure at the back of your  throat. What happens after the procedure?  You will be taken to a recovery area so the sedative can wear off.  Your throat may be sore and scratchy. This will go away slowly over time.  You will go home when you are fully awake and able to swallow liquids.  You should have someone stay with you for the next 24 hours.  Do not drive or operate machinery for the next 24 hours. This information is not intended to replace advice given to you by your health care provider. Make sure you discuss any questions you have with your health care provider. Document Released: 04/08/2009 Document Revised: 11/17/2015 Document Reviewed: 12/11/2012 Elsevier Interactive Patient Education  [employer].

## 2018-05-28 NOTE — Progress Notes (Signed)
Cardiology Office Note   Date:  05/28/2018   ID:  Debra White, DOB 1934-04-23, MRN [id number]  PCP:  Unk Pinto, MD  Cardiologist:   Skeet Latch, MD   No chief complaint on file.     History of Present Illness: Debra White is a 84 y.o. female with hypertension, emphysema, chronic hyponatremia, atherosclerosis of the aorta and renal/splenic infarcts who is being seen today for the evaluation of splenic and renal infarcts at the request of Vicie Mutters, PA-C.  Debra White has been admitted to hospital multiple times recently due to a splenic/renal infarct and diverticulitis.  She had an echo 03/23/2018 that revealed LVEF 55 to 60% with mild mitral regurgitation and a severely dilated left atrium.  She wore a Holter monitor but didn't keep it on long because it made her itch.  She does note that she sometimes has palpitations and feels like her heart races at night.  She wonders if this is anxiety, as she does have frequent anxiety attacks.  Clonazepam seems to help.  She did not undergo a TEE at that time.  She was started on Eliquis for anticoagulation with plans to continue it for 6 months.  Since her hospitalization in September she was again hospitalized with diverticulitis in November.  She continues to have some epigastric and left upper quadrant discomfort with tight clothing.  She has no chest pain or shortness of breath.  She sometimes has abdominal bloating and discomfort, though this has been better recently.  She denies any melena or hematochezia recently.  She reports myalgias.  Her pravastatin dose was reduced from 40mg  to 20 mg with some improvement in her symptoms.  Debra White notes that she cannot tolerate aspirin due to GI upset.  While in the hospital she had multiple abdominal CTs that showed atherosclerosis of the aorta.   Past Medical History:  Diagnosis Date  . Anxiety   . Arthritis   . Blood transfusion   . C. difficile colitis   . Candida  esophagitis ([place]) 2013   EGD  . Cataract   . Chronic kidney disease    STONES  . Diverticulitis 04/05/2017  . Diverticulosis of colon (without mention of hemorrhage) 2007   Colonoscopy  . Dysrhythmia    RX  . Emphysema of lung ([place])   . Family history of colon cancer    sister  . Fibromyalgia   . Fracture, zygoma closed ([place]) 07/13/2011  . GERD (gastroesophageal reflux disease)   . Headache(784.0)   . Hyperlipemia   . Hypertension   . Kidney stones   . Ptosis, bilateral   . Renal infarct ([place])   . Splenic infarct     Past Surgical History:  Procedure Laterality Date  . ABDOMINAL HYSTERECTOMY    . BACK SURGERY     X2  . BACK SURGERY    . BREAST EXCISIONAL BIOPSY    . CERVICAL DISC SURGERY    . COLONOSCOPY    . FACIAL FRACTURE SURGERY    . HAND SURGERY     BIL   . ORIF TRIPOD FRACTURE  07/13/2011   Procedure: OPEN REDUCTION INTERNAL FIXATION (ORIF) TRIPOD FRACTURE;  Surgeon: Tyson Alias, MD;  Location: [place];  Service: ENT;  Laterality: Right;  ORIF RIGHT ZYGOMA, ORBITAL FLOOR EXPLORATION WITH FROST STITCH (TEMPORARY TARSORRHAPHY)  . PTOSIS REPAIR Bilateral 11/22/2016   Procedure: INTERNAL PTOSIS REPAIR;  Surgeon: Clista Bernhardt, MD;  Location: [place];  Service: Ophthalmology;  Laterality: Bilateral;  . SHOULDER ARTHROSCOPY W/ ROTATOR CUFF REPAIR     LFT     Current Outpatient Medications  Medication Sig Dispense Refill  . acetaminophen (TYLENOL) 500 MG tablet Take 500-1,000 mg by mouth every 6 (six) hours as needed for headache.    Marland Kitchen apixaban (ELIQUIS) 5 MG TABS tablet Take 1 tablet (5 mg total) by mouth 2 (two) times daily. 60 tablet 0  . atenolol (TENORMIN) 100 MG tablet Take 50 mg by mouth 2 (two) times daily.    . Cholecalciferol (VITAMIN D) 2000 units tablet Take 4,000 Units by mouth daily.     . clonazePAM (KLONOPIN) 1 MG tablet TAKE 1/2 TO 1 TABLET BY MOUTH TWO TO THREE TIMES DAILY ONLY IF NEEDEDFOR ANXIETY ATTACK AND LIMIT TO 5 DAYS PER WEEK TO AVOID  ADDICTION 90 tablet 0  . diclofenac sodium (VOLTAREN) 1 % GEL Apply 2 g topically 4 (four) times daily.    Marland Kitchen dicyclomine (BENTYL) 10 MG capsule Take 10 mg by mouth daily.    . famotidine (PEPCID) 40 MG tablet Take 1 tablet (40 mg total) by mouth daily. 90 tablet 1  . gabapentin (NEURONTIN) 600 MG tablet Take 1/2 to 1 tablet s 3 to 4 x / day as needed for Facial Pain (Patient taking differently: Take 300-600 mg by mouth See admin instructions. Take 300 mg in the morning, 300 mg at 1400 and 600 mg at bedtime for facial pain) 360 tablet 3  . losartan (COZAAR) 100 MG tablet Take 50 mg by mouth daily.    Marland Kitchen olmesartan (BENICAR) 40 MG tablet Take 1/2 to 1 tablet daily for BP 90 tablet 3  . ondansetron (ZOFRAN) 8 MG tablet Take 1 tablet 3 x  /day if needed for Nausea 90 tablet 0  . pravastatin (PRAVACHOL) 40 MG tablet Take 0.5 tablets (20 mg total) by mouth every evening.    . promethazine (PHENERGAN) 25 MG tablet Take 1 or 2 tablets 4 x /day if needed for Nausea 50 tablet 0  . traMADol (ULTRAM) 50 MG tablet TAKE 1 TABLET BY MOUTH 4 TIMES DAILY AS NEEDED FOR PAIN (Patient taking differently: Take 50 mg by mouth 4 (four) times daily as needed (pain). ) 120 tablet 1   No current facility-administered medications for this visit.     Allergies:   Latex; Amoxicillin; Aspirin; Barbiturates; Celebrex [celecoxib]; Codeine; Evista [raloxifene]; Lipitor [atorvastatin]; Morphine and related; Oruvail [ketoprofen]; Pantoprazole; Paraffin; Prochlorperazine; Proloprim [trimethoprim]; Venlafaxine; Vibra-tab [doxycycline]; Vioxx [rofecoxib]; Betadine [povidone iodine]; and Caffeine    Social History:  The patient  reports that she has never smoked. She has never used smokeless tobacco. She reports that she does not drink alcohol or use drugs.   Family History:  The patient's family history includes Breast cancer (age of onset: 57) in her sister; Cancer in her sister, sister, and sister; Colon cancer in her sister; Heart  attack in her father; Heart disease in her father, mother, and sister; Hyperlipidemia in her daughter; Hypertension in her daughter.    ROS:  Please see the history of present illness.   Otherwise, review of systems are positive for none.   All other systems are reviewed and negative.    PHYSICAL EXAM: VS:  BP 139/70 (BP Location: Left Arm)   Pulse (!) 52   Ht 5\' 4"  (1.626 m)   Wt 113 lb 12.8 oz (51.6 kg)   BMI 19.53 kg/m  , BMI Body mass index is 19.53 kg/m. GENERAL:  Well  appearing HEENT:  Pupils equal round and reactive, fundi not visualized, oral mucosa unremarkable NECK:  No jugular venous distention, waveform within normal limits, carotid upstroke brisk and symmetric, no bruits, no thyromegaly LYMPHATICS:  No cervical adenopathy LUNGS:  Clear to auscultation bilaterally HEART:  RRR.  PMI not displaced or sustained,S1 and S2 within normal limits, no S3, no S4, no clicks, no rubs, no murmurs ABD:  Flat, positive bowel sounds normal in frequency in pitch, no bruits, no rebound, no guarding, no midline pulsatile mass, no hepatomegaly, no splenomegaly EXT:  2 plus pulses throughout, no edema, no cyanosis no clubbing SKIN:  No rashes no nodules NEURO:  Cranial nerves II through XII grossly intact, motor grossly intact throughout PSYCH:  Cognitively intact, oriented to person place and time    EKG:  EKG is not ordered today. The ekg ordered today demonstrates   Echo 03/23/18: Study Conclusions  - Left ventricle: The cavity size was normal. Wall thickness was   normal. Systolic function was normal. The estimated ejection   fraction was in the range of 55% to 60%. Wall motion was normal;   there were no regional wall motion abnormalities. Left   ventricular diastolic function parameters were normal. - Aortic valve: There was trivial regurgitation. - Mitral valve: There was mild regurgitation. - Left atrium: The atrium was severely dilated. - Right atrium: The atrium was mildly  dilated. - Pulmonary arteries: Systolic pressure was mildly increased. PA   peak pressure: 39 mm Hg (S).  Impressions:  - Normal LV function; biatrial enlargement; mild MR and TR; mild   pulmonary hypertension.  7 Day Event Monitor 03/2018: (patient wore the monitor for 6 hours)  Quality: Fair.  Baseline artifact. Predominant rhythm: sinus bradycardia Average heart rate: 59 bpm Max heart rate: 77 bpm Min heart rate: 48 bpm Pauses >2.5 seconds: none  No arrhythmias noted  Recent Labs: 02/26/2018: TSH 0.81 04/17/2018: Magnesium 1.9 05/19/2018: ALT 10; BUN 7; Creat 0.66; Hemoglobin 12.2; Platelets 244; Potassium 4.9; Sodium 132    Lipid Panel    Component Value Date/Time   CHOL 196 02/26/2018 1050   TRIG 184 (H) 02/26/2018 1050   HDL 69 02/26/2018 1050   CHOLHDL 2.8 02/26/2018 1050   VLDL 31 (H) 02/05/2017 1149   LDLCALC 99 02/26/2018 1050      Wt Readings from Last 3 Encounters:  05/28/18 113 lb 12.8 oz (51.6 kg)  05/19/18 115 lb (52.2 kg)  05/01/18 115 lb (52.2 kg)      ASSESSMENT AND PLAN:  # Splenic and renal infarcts: No clear etiology of this embolic disease.  She does report palpitations but was unable to tolerate the Holter monitor.  We will get a TEE to assess for PFO.  Given that she has been on uninterrupted anticoagulation for three months it is unlikely that she has a LAA thrombus.  If there is no PFO when we will ask EP to implant an ILR.  # Atherosclerosis of the aorta: # Hyperlipidemia: Debra White is not very active but she doesn't have symptoms of ischemia.  Given her age and lack of symptoms she wouldn't be a good candidate for cath at this time.  Therefore we won't pursue an ischemia evaluation.  She is on pravastatin but having myalgias.  We will stop pravastatin for two weeks.  If her symptoms resolve then we will try to find an alternative treatment for her lipids.  If there is no improvement then she will resume her statin.  She is not on  aspirin given that she is on Eliquis.  She reports that she cannot tolerate aspirin due to GI upset.  If Eliquis is ultimately discontinued would consider clopidogrel versus no antiplatelet agents, especially given her recurrent diverticulitis   Current medicines are reviewed at length with the patient today.  The patient does not have concerns regarding medicines.  The following changes have been made:  no change  Labs/ tests ordered today include:   Orders Placed This Encounter  Procedures  . CBC with Differential/Platelet  . Basic metabolic panel     Disposition:   FU with Tyvion Edmondson C. Oval Linsey, MD, [facility] in about 6 weeks.      Signed, Rajvir Ernster C. Oval Linsey, MD, [facility]  05/28/2018 11:49 AM    [place]

## 2018-06-01 ENCOUNTER — Encounter: Payer: Self-pay | Admitting: Internal Medicine

## 2018-06-01 NOTE — Progress Notes (Signed)
This very nice 84 y.o. MWF presents for 6 month follow up with HTN, HLD, Pre-Diabetes and Vitamin D Deficiency. Patient has hx/o Chronic & acute Anxiety.      Patient was hospitalized in Sept 2019 for Renal / Splenic Infarcts and has since been on Eliquis. Patient also has recently been hospitalized for Diverticulitis (Oct 15-19, 2019).      Patient is treated for HTN (1990)  & BP has been controlled at home. Today's BP was initially elevated & re-checked at goal - 134/76. Patient has had no complaints of any cardiac type chest pain, palpitations, dyspnea / orthopnea / PND, dizziness, claudication, or dependent edema. Patient has been seen by Cardiologist Dr Darene Lamer. [place] and is scheduled for TEE in January.      Hyperlipidemia is controlled with diet & meds. Patient denies myalgias or other med SE's. Last Lipids were at goal albeit elevated Trig's: Lab Results  Component Value Date   CHOL 196 02/26/2018   HDL 69 02/26/2018   LDLCALC 99 02/26/2018   TRIG 184 (H) 02/26/2018   CHOLHDL 2.8 02/26/2018      Also, the patient has history of PreDiabetes - initially with A1c 6.8% in 2001, then 5.8% in 2013 and after 30# weight loss A1c dropped to 5.3%. She denies symptoms of reactive hypoglycemia, diabetic polys, paresthesias or visual blurring.  Recent A1c was still not at goal: Lab Results  Component Value Date   HGBA1C 5.8 (H) 02/26/2018      Further, the patient also has history of Vitamin D Deficiency and supplements vitamin D without any suspected side-effects. Last vitamin D was at goal: Lab Results  Component Value Date   VD25OH 22 11/14/2017   Current Outpatient Medications on File Prior to Visit  Medication Sig  . acetaminophen (TYLENOL) 500 MG tablet Take 500-1,000 mg by mouth every 6 (six) hours as needed for headache.  Marland Kitchen apixaban (ELIQUIS) 5 MG TABS tablet Take 1 tablet (5 mg total) by mouth 2 (two) times daily.  Marland Kitchen atenolol (TENORMIN) 100 MG tablet Take 50 mg by mouth 2 (two)  times daily.  . Cholecalciferol (VITAMIN D) 2000 units tablet Take 4,000 Units by mouth daily.   . clonazePAM (KLONOPIN) 1 MG tablet TAKE 1/2 TO 1 TABLET BY MOUTH TWO TO THREE TIMES DAILY ONLY IF NEEDEDFOR ANXIETY ATTACK AND LIMIT TO 5 DAYS PER WEEK TO AVOID ADDICTION  . diclofenac sodium (VOLTAREN) 1 % GEL Apply 2 g topically 4 (four) times daily.  Marland Kitchen dicyclomine (BENTYL) 10 MG capsule Take 10 mg by mouth daily.  . famotidine (PEPCID) 40 MG tablet Take 1 tablet (40 mg total) by mouth daily.  Marland Kitchen gabapentin (NEURONTIN) 600 MG tablet Take 1/2 to 1 tablet s 3 to 4 x / day as needed for Facial Pain (Patient taking differently: Take 300-600 mg by mouth See admin instructions. Take 300 mg in the morning, 300 mg at 1400 and 600 mg at bedtime for facial pain)  . losartan (COZAAR) 100 MG tablet Take 50 mg by mouth daily.  Marland Kitchen olmesartan (BENICAR) 40 MG tablet Take 1/2 to 1 tablet daily for BP  . ondansetron (ZOFRAN) 8 MG tablet Take 1 tablet 3 x  /day if needed for Nausea  . pravastatin (PRAVACHOL) 40 MG tablet Take 0.5 tablets (20 mg total) by mouth every evening.  . promethazine (PHENERGAN) 25 MG tablet Take 1 or 2 tablets 4 x /day if needed for Nausea  . traMADol (ULTRAM) 50 MG  tablet TAKE 1 TABLET BY MOUTH 4 TIMES DAILY AS NEEDED FOR PAIN (Patient taking differently: Take 50 mg by mouth 4 (four) times daily as needed (pain). )  . [DISCONTINUED] omeprazole (PRILOSEC OTC) 20 MG tablet Take 20 mg by mouth daily.   No current facility-administered medications on file prior to visit.    Allergies  Allergen Reactions  . Latex Hives    TONGUE HAD BLISTERS WHEN HAD DENTAL SURGERY  . Amoxicillin Nausea And Vomiting  . Aspirin Other (See Comments)    Gi upset  . Barbiturates Other (See Comments)    Unknown reaction  . Celebrex [Celecoxib] Other (See Comments)    Unknown reaction  . Codeine Itching  . Evista [Raloxifene] Other (See Comments)    Unknown reaction  . Lipitor [Atorvastatin] Other (See  Comments)    Unknown reaction  . Morphine And Related Other (See Comments)    Unknown reaction  . Oruvail [Ketoprofen] Other (See Comments)    Unknown reaction  . Pantoprazole Diarrhea  . Paraffin Other (See Comments)    Unknown reaction  . Prochlorperazine Other (See Comments)    Uncontrolled shaking  . Proloprim [Trimethoprim] Other (See Comments)    Unknown reaction  . Venlafaxine Nausea Only  . Vibra-Tab [Doxycycline] Other (See Comments)    Unknown reaction  . Vioxx [Rofecoxib] Other (See Comments)    edema  . Betadine [Povidone Iodine] Rash  . Caffeine Palpitations   PMHx:   Past Medical History:  Diagnosis Date  . Anxiety   . Arthritis   . Blood transfusion   . C. difficile colitis   . Candida esophagitis ([place]) 2013   EGD  . Cataract   . Chronic kidney disease    STONES  . Diverticulitis 04/05/2017  . Diverticulosis of colon (without mention of hemorrhage) 2007   Colonoscopy  . Dysrhythmia    RX  . Emphysema of lung ([place])   . Family history of colon cancer    sister  . Fibromyalgia   . Fracture, zygoma closed ([place]) 07/13/2011  . GERD (gastroesophageal reflux disease)   . Headache(784.0)   . Hyperlipemia   . Hypertension   . Kidney stones   . Ptosis, bilateral   . Renal infarct ([place])   . Splenic infarct    Immunization History  Administered Date(s) Administered  . Influenza, High Dose Seasonal PF 03/25/2014, 04/05/2016, 06/02/2018  . Influenza-Unspecified 03/09/2013, 03/11/2015  . Pneumococcal Conjugate-13 12/21/2015  . Pneumococcal Polysaccharide-23 06/25/2002  . Td 06/26/2003   Past Surgical History:  Procedure Laterality Date  . ABDOMINAL HYSTERECTOMY    . BACK SURGERY     X2  . BACK SURGERY    . BREAST EXCISIONAL BIOPSY    . CERVICAL DISC SURGERY    . COLONOSCOPY    . FACIAL FRACTURE SURGERY    . HAND SURGERY     BIL   . ORIF TRIPOD FRACTURE  07/13/2011   Procedure: OPEN REDUCTION INTERNAL FIXATION (ORIF) TRIPOD FRACTURE;  Surgeon:  Tyson Alias, MD;  Location: [facility];  Service: ENT;  Laterality: Right;  ORIF RIGHT ZYGOMA, ORBITAL FLOOR EXPLORATION WITH FROST STITCH (TEMPORARY TARSORRHAPHY)  . PTOSIS REPAIR Bilateral 11/22/2016   Procedure: INTERNAL PTOSIS REPAIR;  Surgeon: Clista Bernhardt, MD;  Location: [facility];  Service: Ophthalmology;  Laterality: Bilateral;  . SHOULDER ARTHROSCOPY W/ ROTATOR CUFF REPAIR     LFT   FHx:    Reviewed / unchanged  SHx:    Reviewed / unchanged  Systems Review:  Constitutional: Denies fever, chills, wt changes, headaches, insomnia, fatigue, night sweats, change in appetite. Eyes: Denies redness, blurred vision, diplopia, discharge, itchy, watery eyes.  ENT: Denies discharge, congestion, post nasal drip, epistaxis, sore throat, earache, hearing loss, dental pain, tinnitus, vertigo, sinus pain, snoring.  CV: Denies chest pain, palpitations, irregular heartbeat, syncope, dyspnea, diaphoresis, orthopnea, PND, claudication or edema. Respiratory: denies cough, dyspnea, DOE, pleurisy, hoarseness, laryngitis, wheezing.  Gastrointestinal: Denies dysphagia, odynophagia, heartburn, reflux, water brash, abdominal pain or cramps, nausea, vomiting, bloating, diarrhea, constipation, hematemesis, melena, hematochezia  or hemorrhoids. Genitourinary: Denies dysuria, frequency, urgency, nocturia, hesitancy, discharge, hematuria or flank pain. Musculoskeletal: Denies arthralgias, myalgias, stiffness, jt. swelling, pain, limping or strain/sprain.  Skin: Denies pruritus, rash, hives, warts, acne, eczema or change in skin lesion(s). Neuro: No weakness, tremor, incoordination, spasms, paresthesia or pain. Psychiatric: Denies confusion, memory loss or sensory loss. Endo: Denies change in weight, skin or hair change.  Heme/Lymph: No excessive bleeding, bruising or enlarged lymph nodes.  Physical Exam  BP 134/76   Pulse 64   Temp 97.7 F (36.5 C)   Resp 16   Ht 5\' 4"  (1.626 m)   Wt 114 lb 6.4 oz (51.9  kg)   BMI 19.64 kg/m   Appears  well nourished, well groomed  and in no distress.  Eyes: PERRLA, EOMs, conjunctiva no swelling or erythema. Sinuses: No frontal/maxillary tenderness ENT/Mouth: EAC's clear, TM's nl w/o erythema, bulging. Nares clear w/o erythema, swelling, exudates. Oropharynx clear without erythema or exudates. Oral hygiene is good. Tongue normal, non obstructing. Hearing intact.  Neck: Supple. Thyroid not palpable. Car 2+/2+ without bruits, nodes or JVD. Chest: Respirations nl with BS clear & equal w/o rales, rhonchi, wheezing or stridor.  Cor: Heart sounds normal w/ regular rate and rhythm without sig. murmurs, gallops, clicks or rubs. Peripheral pulses normal and equal  without edema.  Abdomen: Soft & bowel sounds normal. Non-tender w/o guarding, rebound, hernias, masses or organomegaly.  Lymphatics: Unremarkable.  Musculoskeletal: Full ROM all peripheral extremities, joint stability, 5/5 strength and normal gait.  Skin: Warm, dry without exposed rashes, lesions or ecchymosis apparent.  Neuro: Cranial nerves intact, reflexes equal bilaterally. Sensory-motor testing grossly intact. Tendon reflexes grossly intact.  Pysch: Alert & oriented x 3.  Insight and judgement nl & appropriate. No ideations.  Assessment and Plan:  1. Essential hypertension  - Continue medication, monitor blood pressure at home.  - Continue DASH diet.  Reminder to go to the ER if any CP,  SOB, nausea, dizziness, severe HA, changes vision/speech.  - CBC with Differential/Platelet - COMPLETE METABOLIC PANEL WITH GFR - Magnesium - TSH  2. Hyperlipidemia, mixed  - Continue diet/meds, exercise,& lifestyle modifications.  - Continue monitor periodic cholesterol/liver & renal functions   - Lipid panel - TSH  3. Abnormal glucose  - Continue diet, exercise,  - lifestyle modifications.  - Monitor appropriate labs.  - Hemoglobin A1c - Insulin, random  4. Vitamin D deficiency  - Continue  supplementation  - VITAMIN D 25 Hydroxyl  5. Prediabetes  - Hemoglobin A1c - Insulin, random  6. Gastroesophageal reflux disease  - CBC with Differential/Platelet  7. Renal infarct ([place])  8. Splenic infarct  9. History of diverticulitis  10. Medication management  - CBC with Differential/Platelet - COMPLETE METABOLIC PANEL WITH GFR - Magnesium - Lipid panel - TSH - Hemoglobin A1c - Insulin, random - VITAMIN D 25 Hydroxyl       Discussed  regular exercise, BP monitoring, weight control  to achieve/maintain BMI less than 25 and discussed med and SE's. Recommended labs to assess and monitor clinical status with further disposition pending results of labs. Over 30 minutes of exam, counseling, chart review was performed.

## 2018-06-01 NOTE — Patient Instructions (Signed)
Bleeding Precautions When on Anticoagulant Therapy  WHAT IS ANTICOAGULANT THERAPY? Anticoagulant therapy is taking medicine to prevent or reduce blood clots. It is also called blood thinner therapy. Blood clots that form in your blood vessels can be dangerous. They can break loose and travel to your heart, lungs, or brain. This increases your risk of a heart attack or stroke. Anticoagulant therapy causes blood to clot more slowly.  You may need anticoagulant therapy if you have:   A medical condition that increases the likelihood that blood clots will form.  A heart defect or a problem with heart rhythm.  WHAT ARE COMMON TYPES OF ANTICOAGULANT THERAPY?  Anticoagulant medicine can be injected or taken by mouth.If you need anticoagulant therapy quickly at the hospital, the medicine may be injected under your skin or given through an IV tube. Heparin is a common example of an anticoagulant that you may get at the hospital.   Most anticoagulant therapy is in the form of pills that you take at home every day. These may include:     Aspirin. This common blood thinner works by preventing blood cells (platelets) from sticking together to form a clot. Aspirin is not as strong as anticoagulants that slow down the time that it takes for your body to form a clot.  Clopidogrel. This is a newer type of drug that affects platelets. It is stronger than aspirin.  Warfarin. This is the most common anticoagulant. It changes the way your body uses vitamin K, a vitamin that helps your blood to clot. The risk of bleeding is higher with warfarin than with aspirin. You will need frequent blood tests to make sure you are taking the safest amount.  You are taking Eliquis  ELIQUIS is one of several new drugs have been approved. They are all taken by mouth. Studies show that these drugs work as well as warfarin. They do not require blood testing. They may cause less bleeding risk than warfarin.   WHAT DO I NEED  TO REMEMBER WHEN TAKING ANTICOAGULANT THERAPY?  Anticoagulant therapy decreases your risk of forming a blood clot, but it increases your risk of bleeding.   Work closely with your health care provider to make sure you are taking your medicine safely. These tips can help:  Learn ways to reduce your risk of bleeding.  If you are taking warfarin:   Take your medicine at the same time every day. If you forget to take your medicine, take it as soon as you remember. If you miss a whole day, do not double your dose of medicine. Take your normal dose and call your health care provider to check in    Do not stop taking your medicine on your own.    Tell your health care provider before you start taking any new medicine, vitamin, or herbal product. Some of these could interfere with your therapy.  Tell all of your health care providers that you are on anticoagulant therapy.  Do not have surgery, medical procedures, or dental work until you tell your health care provider that you are on anticoagulant therapy.  You can prevent bleeding by taking certain precautions:   Be extra careful when you use knives, scissors, or other sharp objects.  Use an electric razor instead of a blade.  Do not use toothpicks.  Use a soft toothbrush.  Wear shoes that have nonskid soles.  Use bath mats and handrails in your bathroom.  Wear gloves while you do yard work.  Wear  a helmet when you ride a bike.  Wear your seat belt.  Prevent falls by removing loose rugs and extension cords from areas where you walk.  Do not play contact sports or participate in other activities that have a high risk of injury.   [place] PROVIDER? Call your health care provider if:  You miss a dose of medicine: ? And you are not sure what to do. ? For more than one day. ?   You have: ? Menstrual bleeding. ? Blood in your urine. ? A bloody nose or bleeding gums. ? Easy  bruising. ? Blood in your stool (feces) or have black and tarry stool. ? Side effects from your medicine.  You feel weak or dizzy.   Seek immediate medical care if:  You have bleeding that will not stop.  You have sudden and severe headache or belly pain.  You vomit or you cough up bright red blood.  You have a severe blow to your head.   ++++++++++++++++++++++++++ Recommend Adult Low Dose Aspirin or  coated  Aspirin 81 mg daily  To reduce risk of Colon Cancer 20 %,  Skin Cancer 26 % ,  Melanoma 46%  and  Pancreatic cancer 60% +++++++++++++++++++++++++ Vitamin D goal  is between 70-100.  Please make sure that you are taking your Vitamin D as directed.  It is very important as a natural anti-inflammatory  helping hair, skin, and nails, as well as reducing stroke and heart attack risk.  It helps your bones and helps with mood. It also decreases numerous cancer risks so please take it as directed.  Low Vit D is associated with a 200-300% higher risk for CANCER  and 200-300% higher risk for HEART   ATTACK  &  STROKE.   .....................................Marland Kitchen It is also associated with higher death rate at younger ages,  autoimmune diseases like Rheumatoid arthritis, Lupus, Multiple Sclerosis.    Also many other serious conditions, like depression, Alzheimer's Dementia, infertility, muscle aches, fatigue, fibromyalgia - just to name a few. ++++++++++++++++++++ Recommend the book "The END of DIETING" by Dr Excell Seltzer  & the book "The END of DIABETES " by Dr Excell Seltzer At [email or web address] - get book & Audio CD's    Being diabetic has a  300% increased risk for heart attack, stroke, cancer, and alzheimer- type vascular dementia. It is very important that you work harder with diet by avoiding all foods that are white. Avoid white rice (brown & wild rice is OK), white potatoes (sweetpotatoes in moderation is OK), White bread or wheat bread or anything made out of white flour like  bagels, donuts, rolls, buns, biscuits, cakes, pastries, cookies, pizza crust, and pasta (made from white flour & egg whites) - vegetarian pasta or spinach or wheat pasta is OK. Multigrain breads like Arnold's or Pepperidge Farm, or multigrain sandwich thins or flatbreads.  Diet, exercise and weight loss can reverse and cure diabetes in the early stages.  Diet, exercise and weight loss is very important in the control and prevention of complications of diabetes which affects every system in your body, ie. Brain - dementia/stroke, eyes - glaucoma/blindness, heart - heart attack/heart failure, kidneys - dialysis, stomach - gastric paralysis, intestines - malabsorption, nerves - severe painful neuritis, circulation - gangrene & loss of a leg(s), and finally cancer and Alzheimers.    I recommend avoid fried & greasy foods,  sweets/candy, white rice (brown or wild rice or Quinoa is OK), white  potatoes (sweet potatoes are OK) - anything made from white flour - bagels, doughnuts, rolls, buns, biscuits,white and wheat breads, pizza crust and traditional pasta made of white flour & egg white(vegetarian pasta or spinach or wheat pasta is OK).  Multi-grain bread is OK - like multi-grain flat bread or sandwich thins. Avoid alcohol in excess. Exercise is also important.    Eat all the vegetables you want - avoid meat, especially red meat and dairy - especially cheese.  Cheese is the most concentrated form of trans-fats which is the worst thing to clog up our arteries. Veggie cheese is OK which can be found in the fresh produce section at [employer] or [employer] or [employer]  +++++++++++++++++++++ DASH Eating Plan  DASH stands for "Dietary Approaches to Stop Hypertension."   The DASH eating plan is a healthy eating plan that has been shown to reduce high blood pressure (hypertension). Additional health benefits may include reducing the risk of type 2 diabetes mellitus, heart disease, and stroke. The DASH eating  plan may also help with weight loss. WHAT DO I NEED TO KNOW ABOUT THE DASH EATING PLAN? For the DASH eating plan, you will follow these general guidelines:  Choose foods with a percent daily value for sodium of less than 5% (as listed on the food label).  Use salt-free seasonings or herbs instead of table salt or sea salt.  Check with your health care provider or pharmacist before using salt substitutes.  Eat lower-sodium products, often labeled as "lower sodium" or "no salt added."  Eat fresh foods.  Eat more vegetables, fruits, and low-fat dairy products.  Choose whole grains. Look for the word "whole" as the first word in the ingredient list.  Choose fish   Limit sweets, desserts, sugars, and sugary drinks.  Choose heart-healthy fats.  Eat veggie cheese   Eat more home-cooked food and less restaurant, buffet, and fast food.  Limit fried foods.  Cook foods using methods other than frying.  Limit canned vegetables. If you do use them, rinse them well to decrease the sodium.  When eating at a restaurant, ask that your food be prepared with less salt, or no salt if possible.                      WHAT FOODS CAN I EAT? Read Dr Fara Olden Fuhrman's books on The End of Dieting & The End of Diabetes  Grains Whole grain or whole wheat bread. Brown rice. Whole grain or whole wheat pasta. Quinoa, bulgur, and whole grain cereals. Low-sodium cereals. Corn or whole wheat flour tortillas. Whole grain cornbread. Whole grain crackers. Low-sodium crackers.  Vegetables Fresh or frozen vegetables (raw, steamed, roasted, or grilled). Low-sodium or reduced-sodium tomato and vegetable juices. Low-sodium or reduced-sodium tomato sauce and paste. Low-sodium or reduced-sodium canned vegetables.   Fruits All fresh, canned (in natural juice), or frozen fruits.  Protein Products  All fish and seafood.  Dried beans, peas, or lentils. Unsalted nuts and seeds. Unsalted canned beans.  Dairy Low-fat  dairy products, such as skim or 1% milk, 2% or reduced-fat cheeses, low-fat ricotta or cottage cheese, or plain low-fat yogurt. Low-sodium or reduced-sodium cheeses.  Fats and Oils Tub margarines without trans fats. Light or reduced-fat mayonnaise and salad dressings (reduced sodium). Avocado. Safflower, olive, or canola oils. Natural peanut or almond butter.  Other Unsalted popcorn and pretzels. The items listed above may not be a complete list of recommended foods or beverages. Contact your dietitian for  more options.  +++++++++++++++  WHAT FOODS ARE NOT RECOMMENDED? Grains/ White flour or wheat flour White bread. White pasta. White rice. Refined cornbread. Bagels and croissants. Crackers that contain trans fat.  Vegetables  Creamed or fried vegetables. Vegetables in a . Regular canned vegetables. Regular canned tomato sauce and paste. Regular tomato and vegetable juices.  Fruits Dried fruits. Canned fruit in light or heavy syrup. Fruit juice.  Meat and Other Protein Products Meat in general - RED meat & White meat.  Fatty cuts of meat. Ribs, chicken wings, all processed meats as bacon, sausage, bologna, salami, fatback, hot dogs, bratwurst and packaged luncheon meats.  Dairy Whole or 2% milk, cream, half-and-half, and cream cheese. Whole-fat or sweetened yogurt. Full-fat cheeses or blue cheese. Non-dairy creamers and whipped toppings. Processed cheese, cheese spreads, or cheese curds.  Condiments Onion and garlic salt, seasoned salt, table salt, and sea salt. Canned and packaged gravies. Worcestershire sauce. Tartar sauce. Barbecue sauce. Teriyaki sauce. Soy sauce, including reduced sodium. Steak sauce. Fish sauce. Oyster sauce. Cocktail sauce. Horseradish. Ketchup and mustard. Meat flavorings and tenderizers. Bouillon cubes. Hot sauce. Tabasco sauce. Marinades. Taco seasonings. Relishes.  Fats and Oils Butter, stick margarine, lard, shortening and bacon fat. Coconut, palm kernel,  or palm oils. Regular salad dressings.  Pickles and olives. Salted popcorn and pretzels.  The items listed above may not be a complete list of foods and beverages to avoid.

## 2018-06-02 ENCOUNTER — Ambulatory Visit (INDEPENDENT_AMBULATORY_CARE_PROVIDER_SITE_OTHER): Payer: PPO | Admitting: Internal Medicine

## 2018-06-02 ENCOUNTER — Encounter: Payer: Self-pay | Admitting: Internal Medicine

## 2018-06-02 VITALS — BP 134/76 | HR 64 | Temp 97.7°F | Resp 16 | Ht 64.0 in | Wt 114.4 lb

## 2018-06-02 DIAGNOSIS — I10 Essential (primary) hypertension: Secondary | ICD-10-CM | POA: Diagnosis not present

## 2018-06-02 DIAGNOSIS — R7309 Other abnormal glucose: Secondary | ICD-10-CM | POA: Diagnosis not present

## 2018-06-02 DIAGNOSIS — Z23 Encounter for immunization: Secondary | ICD-10-CM | POA: Diagnosis not present

## 2018-06-02 DIAGNOSIS — E559 Vitamin D deficiency, unspecified: Secondary | ICD-10-CM | POA: Diagnosis not present

## 2018-06-02 DIAGNOSIS — Z79899 Other long term (current) drug therapy: Secondary | ICD-10-CM | POA: Diagnosis not present

## 2018-06-02 DIAGNOSIS — D735 Infarction of spleen: Secondary | ICD-10-CM

## 2018-06-02 DIAGNOSIS — E782 Mixed hyperlipidemia: Secondary | ICD-10-CM | POA: Diagnosis not present

## 2018-06-02 DIAGNOSIS — R7303 Prediabetes: Secondary | ICD-10-CM

## 2018-06-02 DIAGNOSIS — N280 Ischemia and infarction of kidney: Secondary | ICD-10-CM

## 2018-06-02 DIAGNOSIS — Z8719 Personal history of other diseases of the digestive system: Secondary | ICD-10-CM | POA: Diagnosis not present

## 2018-06-02 DIAGNOSIS — K219 Gastro-esophageal reflux disease without esophagitis: Secondary | ICD-10-CM | POA: Diagnosis not present

## 2018-06-02 MED ORDER — DICLOFENAC SODIUM 1 % TD GEL: 2.0000 g | Freq: Four times a day (QID) | TRANSDERMAL | 2 refills | Status: DC

## 2018-06-03 LAB — LIPID PANEL
Cholesterol: 196 mg/dL (ref <200)
HDL: 70 mg/dL (ref >50)
LDL CHOLESTEROL (CALC): 93 mg/dL
Non-HDL Cholesterol (Calc): 126 mg/dL (calc) (ref <130)
TRIGLYCERIDES: 249 mg/dL — AB (ref <150)
Total CHOL/HDL Ratio: 2.8 (calc) (ref <5.0)

## 2018-06-03 LAB — HEMOGLOBIN A1C
EAG (MMOL/L): 6.2 (calc)
Hgb A1c MFr Bld: 5.5 % of total Hgb (ref <5.7)
Mean Plasma Glucose: 111 (calc)

## 2018-06-03 LAB — CBC WITH DIFFERENTIAL/PLATELET
BASOS PCT: 0.5 %
Basophils Absolute: 30 cells/uL (ref 0–200)
EOS ABS: 47 {cells}/uL (ref 15–500)
EOS PCT: 0.8 %
HCT: 36.6 % (ref 35.0–45.0)
HEMOGLOBIN: 12 g/dL (ref 11.7–15.5)
Lymphs Abs: 1210 cells/uL (ref 850–3900)
MCH: 30.6 pg (ref 27.0–33.0)
MCHC: 32.8 g/dL (ref 32.0–36.0)
MCV: 93.4 fL (ref 80.0–100.0)
MONOS PCT: 10.9 %
MPV: 10.9 fL (ref 7.5–12.5)
NEUTROS ABS: 3971 {cells}/uL (ref 1500–7800)
Neutrophils Relative %: 67.3 %
PLATELETS: 229 10*3/uL (ref 140–400)
RBC: 3.92 10*6/uL (ref 3.80–5.10)
RDW: 12 % (ref 11.0–15.0)
Total Lymphocyte: 20.5 %
WBC mixed population: 643 cells/uL (ref 200–950)
WBC: 5.9 10*3/uL (ref 3.8–10.8)

## 2018-06-03 LAB — COMPLETE METABOLIC PANEL WITH GFR
AG Ratio: 1.7 (calc) (ref 1.0–2.5)
ALBUMIN MSPROF: 4.5 g/dL (ref 3.6–5.1)
ALT: 10 U/L (ref 6–29)
AST: 15 U/L (ref 10–35)
Alkaline phosphatase (APISO): 70 U/L (ref 33–130)
BUN: 10 mg/dL (ref 7–25)
CALCIUM: 9.8 mg/dL (ref 8.6–10.4)
CO2: 32 mmol/L (ref 20–32)
CREATININE: 0.73 mg/dL (ref 0.60–0.88)
Chloride: 100 mmol/L (ref 98–110)
GFR, EST AFRICAN AMERICAN: 89 mL/min/{1.73_m2} (ref >=60)
GFR, Est Non African American: 77 mL/min/{1.73_m2} (ref >=60)
Globulin: 2.7 g/dL (calc) (ref 1.9–3.7)
Glucose, Bld: 117 mg/dL — ABNORMAL HIGH (ref 65–99)
Potassium: 4.2 mmol/L (ref 3.5–5.3)
Sodium: 138 mmol/L (ref 135–146)
TOTAL PROTEIN: 7.2 g/dL (ref 6.1–8.1)
Total Bilirubin: 0.3 mg/dL (ref 0.2–1.2)

## 2018-06-03 LAB — INSULIN, RANDOM: Insulin: 6 u[IU]/mL (ref 2.0–19.6)

## 2018-06-03 LAB — VITAMIN D 25 HYDROXY (VIT D DEFICIENCY, FRACTURES): Vit D, 25-Hydroxy: 61 ng/mL (ref 30–100)

## 2018-06-03 LAB — MAGNESIUM: Magnesium: 1.8 mg/dL (ref 1.5–2.5)

## 2018-06-03 LAB — TSH: TSH: 0.79 m[IU]/L (ref 0.40–4.50)

## 2018-06-09 ENCOUNTER — Encounter: Payer: Self-pay | Admitting: Gastroenterology

## 2018-06-09 ENCOUNTER — Ambulatory Visit (INDEPENDENT_AMBULATORY_CARE_PROVIDER_SITE_OTHER): Payer: PPO | Admitting: Gastroenterology

## 2018-06-09 VITALS — BP 121/64 | HR 84 | Ht 64.0 in | Wt 114.0 lb

## 2018-06-09 DIAGNOSIS — K219 Gastro-esophageal reflux disease without esophagitis: Secondary | ICD-10-CM

## 2018-06-09 DIAGNOSIS — K588 Other irritable bowel syndrome: Secondary | ICD-10-CM

## 2018-06-09 MED ORDER — DICYCLOMINE HCL 10 MG PO CAPS: 10.0000 mg | ORAL_CAPSULE | Freq: Three times a day (TID) | ORAL | 11 refills | Status: DC

## 2018-06-09 NOTE — Patient Instructions (Addendum)
Increase your dicyclomine 10 mg (dark blue pill) to one tablet by mouth three times a day before meals. A new prescription has been sent to your pharmacy.   You can take your hyoscamine (green pill) one tablet by mouth every 4 hours as needed.  Continue your famotidine (round white pill) one tablet by mouth daily.   Follow up with your primary care physician for your ongoing care.  Thank you for choosing me and [facility].  Pricilla Riffle. Dagoberto Ligas., MD., [facility] Regal

## 2018-06-09 NOTE — Progress Notes (Signed)
    History of Present Illness: This is an 84 year old female with IBS, history of C diff and GERD. She a splenic infarct and right renal infarct in September and she was placed on Eliquis.  She had sigmoid colon diverticulitis in October and was treated with antibiotics. She relates frequent postprandial upper abdominal discomfort and bloating.  She has some difficulty in understanding which medications are for which particular problem.  She has intermittent mild constipation.  She had 4 abdominal/pelvic CT/CTA scans between September and November  2019. CMP, CBC, TSH normal 1 week ago except gluc=117.    Abd/pelvic 04/08/2018 IMPRESSION: 1. Interval development of sigmoid colon diverticulitis. Inflammatory process extends towards the left without drainable abscess or free air. Significant baseline diverticula and muscular hypertrophy. 2. Remote splenic and right renal infarct.  No acute infarct noted. 3. Aortic Atherosclerosis (ICD10-I70.0). Coronary artery calcifications. 4. Enlarged liver   EGD 02/2012 normal Colonoscopy 02/2012 severe diverticulosis   Current Medications, Allergies, Past Medical History, Past Surgical History, Family History and Social History were reviewed in [employer] record.  Physical Exam: General: Well developed, well nourished, elderly, thin, tearful at times Head: Normocephalic and atraumatic Eyes:  sclerae anicteric, EOMI Ears: Normal auditory acuity Mouth: No deformity or lesions Lungs: Clear throughout to auscultation Heart: Regular rate and rhythm; no murmurs, rubs or bruits Abdomen: Soft, non tender and non distended. No masses, hepatosplenomegaly or hernias noted. Normal Bowel sounds Rectal: Not done Musculoskeletal: Symmetrical with no gross deformities  Pulses:  Normal pulses noted Extremities: No clubbing, cyanosis, edema or deformities noted Neurological: Alert oriented x 4, grossly nonfocal Psychological:  Alert and  cooperative.  Depressed affect   Assessment and Recommendations:  1. GERD. Famotidine 40 mg PO qam. Follow standard antireflux measures.  Use of this medication, including identifying her famotidine tablets and bottle, was reviewed in detail with the patient.  If her symptoms are not adequately controlled taking famotidine on a regular daily basis increase to twice daily.  I remain concerned that she may not use her medications correctly.  If this is not effective then change to a PPI qam.  She is reassured that no further evaluation is necessary at this time.  If symptoms are not adequately controlled with above consider EGD for further evaluation.   2. IBS/dyspepsia with post prandial bloating, mild constipation.  MiraLAX one half scoop to 1 scoop daily as needed for management of constipation.  Dicyclomine 10 mg PO tid 30 minutes ac (not prn). Hyoscyamine 0.125 mg SL/PO q4h prn abdominal pain/bloating. Use of dicyclomine and hyoscyamine, including identifying the capsules and pills and their respective bottles, was reviewed in detail with the patient.  I remain concerned that she may not use her medications correctly.  She is reassured that no further evaluation is necessary at this time.  If symptoms are not adequately controlled with above consider EGD for further evaluation.  3.  Sigmoid colon diverticulitis, resolved in October.  4. Anticoagulation with Eliquis.   Return to PCP for ongoing care. GI follow up as needed.   I spent 15 minutes of face-to-face time with the patient. Greater than 50% of the time was spent counseling and coordinating care.

## 2018-06-15 NOTE — Progress Notes (Signed)
MEDICARE ANNUAL WELLNESS VISIT AND FOLLOW UP  Assessment:   [name] was seen today for medicare wellness.  Diagnoses and all orders for this visit:  Encounter for Medicare annual wellness exam -Yearly  Essential hypertension - continue medications, DASH diet, exercise and monitor at home. Call if greater than 130/80.   Hyperlipidemia, mixed -No medications at this time, management with Cardiology related to recent myalgias.  Pulmonary emphysema, unspecified emphysema type ([place]) -Continue to monitor  Renal infarct [facility]) -    Following with Cardiology, TEE scheduled - continue 5 mg BID until after 6 months of initial treatment and then we can reduce to 2.5 mg BID due to weight.  -Monitor for bleeding  Aortic atherosclerosis (HCC) Continue to monitor Lipid management  Abnormal glucose Discussed dietary and exercise modifications  Vitamin D deficiency Continue supplementation  Gastroesophageal reflux disease, esophagitis presence not specified Doing well at this time, continue pepsid  Diverticulitis Doing well at this time with management Monitors diet Has Hycosamine, no recent flares  Medication management Discussed current medications, no concerns at this time UTD with chronic management appointments     Call or return with new or worsening symptoms as discussed in appointment.  May contact via office phone [phone]  via [place].  Over 40 minutes of exam, counseling, chart review and critical decision making was performed Future Appointments  Date Time Provider [place]  07/17/2018  9:30 AM Erlene Quan, PA-C [facility]  09/03/2018 11:15 AM Vicie Mutters, PA-C [facility] None  12/09/2018  9:00 AM Unk Pinto, MD [facility] None  05/08/2019 10:30 AM Vicie Mutters, PA-C [facility] None     Plan:   During the course of the visit the patient was educated and counseled about appropriate screening and preventive services including:     Pneumococcal vaccine   Prevnar 13  Influenza vaccine  Td vaccine  Screening electrocardiogram  Bone densitometry screening  Colorectal cancer screening  Diabetes screening  Glaucoma screening  Nutrition counseling   Advanced directives: requested   Subjective:  Debra White is a 84 y.o. female who presents for Medicare Annual Wellness Vist. Reports that she had splenic and renal infarcts and is taking Eliquis for this.  She worse a Holter moitor but was unable to tolerate this.  She will have TEE to assesses for PFO.  This is scheduled for 07/01/2018.    She has had elevated blood pressure for over 30 years. Her blood pressure has been controlled at home, today their BP is BP: 122/80 She does not workout. She denies chest pain, shortness of breath, dizziness.  She is not on cholesterol medication at this time.  She was experiencing myalgias.  Cardiology advised her to stop and managing this restart.  Her cholesterol is not at goal. The cholesterol last visit was:   Lab Results  Component Value Date   CHOL 196 06/02/2018   HDL 70 06/02/2018   LDLCALC 93 06/02/2018   TRIG 249 (H) 06/02/2018   CHOLHDL 2.8 06/02/2018   She has prediabetes.  has not been working on diet and exercise for prediabetes, and denies hyperglycemia, hypoglycemia , nausea, polydipsia, polyuria and visual disturbances. Last A1C in the office was:  Lab Results  Component Value Date   HGBA1C 5.5 06/02/2018   Last GFR:   Lab Results  Component Value Date   GFRNONAA 77 06/02/2018   Lab Results  Component Value Date   GFRAA 89 06/02/2018   Patient is on Vitamin D supplement.   Lab  Results  Component Value Date   VD25OH 61 06/02/2018      Medication Review: Current Outpatient Medications on File Prior to Visit  Medication Sig Dispense Refill  . acetaminophen (TYLENOL) 500 MG tablet Take 500-1,000 mg by mouth every 6 (six) hours as needed for headache.    Marland Kitchen apixaban (ELIQUIS) 5 MG TABS  tablet Take 1 tablet (5 mg total) by mouth 2 (two) times daily. 60 tablet 0  . atenolol (TENORMIN) 100 MG tablet Take 50 mg by mouth 2 (two) times daily.    . Cholecalciferol (VITAMIN D) 2000 units tablet Take 4,000 Units by mouth daily.     . clonazePAM (KLONOPIN) 1 MG tablet TAKE 1/2 TO 1 TABLET BY MOUTH TWO TO THREE TIMES DAILY ONLY IF NEEDEDFOR ANXIETY ATTACK AND LIMIT TO 5 DAYS PER WEEK TO AVOID ADDICTION 90 tablet 0  . diclofenac sodium (VOLTAREN) 1 % GEL Apply 2 g topically 4 (four) times daily. 500 g 2  . dicyclomine (BENTYL) 10 MG capsule Take 1 capsule (10 mg total) by mouth 3 (three) times daily before meals. 90 capsule 11  . famotidine (PEPCID) 40 MG tablet Take 1 tablet (40 mg total) by mouth daily. 90 tablet 1  . gabapentin (NEURONTIN) 600 MG tablet Take 1/2 to 1 tablet s 3 to 4 x / day as needed for Facial Pain (Patient taking differently: Take 300-600 mg by mouth See admin instructions. Take 300 mg in the morning, 300 mg at 1400 and 600 mg at bedtime for facial pain) 360 tablet 3  . hyoscyamine (LEVSIN SL) 0.125 MG SL tablet Place 0.125 mg under the tongue every 4 (four) hours as needed. 1-2 by mouth every 4 hours as needed    . olmesartan (BENICAR) 40 MG tablet Take 1/2 to 1 tablet daily for BP 90 tablet 3  . ondansetron (ZOFRAN) 8 MG tablet Take 1 tablet 3 x  /day if needed for Nausea 90 tablet 0  . promethazine (PHENERGAN) 25 MG tablet Take 1 or 2 tablets 4 x /day if needed for Nausea 50 tablet 0  . traMADol (ULTRAM) 50 MG tablet TAKE 1 TABLET BY MOUTH 4 TIMES DAILY AS NEEDED FOR PAIN (Patient taking differently: Take 50 mg by mouth 4 (four) times daily as needed (pain). ) 120 tablet 1  . [DISCONTINUED] omeprazole (PRILOSEC OTC) 20 MG tablet Take 20 mg by mouth daily.     No current facility-administered medications on file prior to visit.     Allergies  Allergen Reactions  . Latex Hives    TONGUE HAD BLISTERS WHEN HAD DENTAL SURGERY  . Amoxicillin Nausea And Vomiting  .  Aspirin Other (See Comments)    Gi upset  . Barbiturates Other (See Comments)    Unknown reaction  . Celebrex [Celecoxib] Other (See Comments)    Unknown reaction  . Codeine Itching  . Evista [Raloxifene] Other (See Comments)    Unknown reaction  . Lipitor [Atorvastatin] Other (See Comments)    Unknown reaction  . Morphine And Related Other (See Comments)    Unknown reaction  . Oruvail [Ketoprofen] Other (See Comments)    Unknown reaction  . Pantoprazole Diarrhea  . Paraffin Other (See Comments)    Unknown reaction  . Prochlorperazine Other (See Comments)    Uncontrolled shaking  . Proloprim [Trimethoprim] Other (See Comments)    Unknown reaction  . Venlafaxine Nausea Only  . Vibra-Tab [Doxycycline] Other (See Comments)    Unknown reaction  . Vioxx [Rofecoxib] Other (  See Comments)    edema  . Betadine [Povidone Iodine] Rash  . Caffeine Palpitations    Current Problems (verified) Patient Active Problem List   Diagnosis Date Noted  . Coronary atherosclerosis due to lipid rich plaque 03/26/2018  . Splenic infarct 03/22/2018  . Renal infarct ([place]) 03/22/2018  . Chronic hyponatremia 03/22/2018  . Chronic hyperkalemia 03/22/2018  . Right sided numbness 03/22/2018  . Hepatic steatosis 11/17/2017  . Aortic atherosclerosis ([place]) 08/08/2017  . 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 Osteoporosis 12/21/2015  . Mild malnutrition ([place]) 05/11/2015  . Encounter for Medicare annual wellness exam 04/08/2015  . Medication management 01/27/2014  . 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 Vitamin D deficiency 07/08/2013  . Hypertension   . Hyperlipidemia   . GERD   . Headache, post-traumatic, chronic 09/20/2011    Screening Tests Immunization History  Administered Date(s) Administered  . Influenza, High Dose Seasonal PF 03/25/2014, 04/05/2016, 06/02/2018  . Influenza-Unspecified 03/09/2013, 03/11/2015  .  Pneumococcal Conjugate-13 12/21/2015  . Pneumococcal Polysaccharide-23 06/25/2002  . Td 06/26/2003    Preventative care: Last colonoscopy: 2013 Last mammogram: 2019 Last pap smear/pelvic exam:  DEXA: 2017  Prior vaccinations: TD or Tdap: 2015, DUE Prefers not to have this related to reaction.  Influenza: 2019  Pneumococcal: 2004 Prevnar13: 2017 Shingles/Zostavax: N/A  Names of Other Physician/Practitioners you currently use: 1. [facility] here for primary care 2.  Eye Exam 2019 3. Dentist Feb 2020  Patient Care Team: Unk Pinto, MD as PCP - General (Internal Medicine) Darleen Crocker, MD as Consulting Physician (Ophthalmology) Ladene Artist, MD as Consulting Physician (Gastroenterology) Cameron Sprang, MD as Consulting Physician (Neurology) Jodi Marble, MD as Consulting Physician (Otolaryngology)  SURGICAL HISTORY She  has a past surgical history that includes Abdominal hysterectomy; Hand surgery; Shoulder arthroscopy w/ rotator cuff repair; Cervical disc surgery; Back surgery; ORIF tripod fracture (07/13/2011); Colonoscopy; Facial fracture surgery; Back surgery; Ptosis repair (Bilateral, 11/22/2016); and Breast excisional biopsy. FAMILY HISTORY Her family history includes Breast cancer (age of onset: 52) in her sister; Cancer in her sister, sister, and sister; Colon cancer in her sister; Heart attack in her father; Heart disease in her father, mother, and sister; Hyperlipidemia in her daughter; Hypertension in her daughter. SOCIAL HISTORY She  reports that she has never smoked. She has never used smokeless tobacco. She reports that she does not drink alcohol or use drugs.   MEDICARE WELLNESS OBJECTIVES: Physical activity: Current Exercise Habits: The patient does not participate in regular exercise at present, Exercise limited by: orthopedic condition(s) Cardiac risk factors: Cardiac Risk Factors include:  none;dyslipidemia;hypertension Depression/mood screen:   Depression screen [facility] 2/9 06/16/2018  Decreased Interest 0  Down, Depressed, Hopeless 0  PHQ - 2 Score 0  Altered sleeping -  Tired, decreased energy -  Change in appetite -  Feeling bad or failure about yourself  -  Trouble concentrating -  Moving slowly or fidgety/restless -  Suicidal thoughts -  PHQ-9 Score -  Some recent data might be hidden    ADLs:  In your present state of health, do you have any difficulty performing the following activities: 06/16/2018 04/20/2018  Hearing? N N  Comment - -  Vision? N N  Difficulty concentrating or making decisions? N N  Walking or climbing stairs? N N  Dressing or bathing? N N  Doing errands, shopping? N N  Preparing Food and eating ? N -  Using the Toilet? N -  In the past six months, have you accidently leaked urine? N -  Do you have problems with loss of bowel control? N -  Managing your Medications? N -  Managing your Finances? N -  Housekeeping or managing your Housekeeping? N -  Some recent data might be hidden     Cognitive Testing  Alert? Yes  Normal Appearance?Yes  Oriented to person? Yes  Place? Yes   Time? Yes  Recall of three objects?  Yes  Can perform simple calculations? Yes  Displays appropriate judgment?Yes  Can read the correct time from a watch face?Yes  EOL planning: Does Patient Have a Medical Advance Directive?: Yes Type of Advance Directive: Healthcare Power of Attorney, Living will [employer] in Chart?: No - copy requested  Review of Systems  Constitutional: Negative for chills, diaphoresis, fever, malaise/fatigue and weight loss.  HENT: Negative for congestion, ear discharge, ear pain, hearing loss, nosebleeds, sinus pain, sore throat and tinnitus.   Eyes: Negative for blurred vision, double vision, photophobia, pain, discharge and redness.  Respiratory: Negative for cough, hemoptysis, sputum production, shortness of  breath, wheezing and stridor.   Cardiovascular: Negative for chest pain, palpitations, orthopnea, claudication, leg swelling and PND.  Gastrointestinal: Negative for abdominal pain, blood in stool, constipation, diarrhea, heartburn, melena, nausea and vomiting.  Genitourinary: Negative for dysuria, flank pain, frequency, hematuria and urgency.  Musculoskeletal: Negative for back pain, falls, joint pain, myalgias and neck pain.  Skin: Negative for itching and rash.  Neurological: Negative for dizziness, tingling, tremors, sensory change, speech change, focal weakness, seizures, loss of consciousness, weakness and headaches.  Endo/Heme/Allergies: Negative for environmental allergies and polydipsia. Does not bruise/bleed easily.  Psychiatric/Behavioral: Negative for depression, hallucinations, memory loss, substance abuse and suicidal ideas. The patient is not nervous/anxious and does not have insomnia.      Objective:     Today's Vitals   06/16/18 1151  BP: 122/80  Pulse: 67  Temp: 97.8 F (36.6 C)  SpO2: 98%  Weight: 114 lb 6.4 oz (51.9 kg)  Height: 5\' 4"  (1.626 m)   Body mass index is 19.64 kg/m.  General appearance: alert, no distress, WD/WN, female HEENT: normocephalic, sclerae anicteric, TMs pearly, nares patent, no discharge or erythema, pharynx normal Oral cavity: MMM, no lesions Neck: supple, no lymphadenopathy, no thyromegaly, no masses Heart: RRR, normal S1, S2, no murmurs Lungs: CTA bilaterally, no wheezes, rhonchi, or rales Abdomen: +bs, soft, non tender, non distended, no masses, no hepatomegaly, no splenomegaly Musculoskeletal: nontender, no swelling, no obvious deformity Extremities: no edema, no cyanosis, no clubbing Pulses: 2+ symmetric, upper and lower extremities, normal cap refill Neurological: alert, oriented x 3, CN2-12 intact, strength normal upper extremities and lower extremities, sensation normal throughout, DTRs 2+ throughout, no cerebellar signs, gait  normal Psychiatric: normal affect, behavior normal, pleasant   Medicare Attestation I have personally reviewed: The patient's medical and social history Their use of alcohol, tobacco or illicit drugs Their current medications and supplements The patient's functional ability including ADLs,fall risks, home safety risks, cognitive, and hearing and visual impairment Diet and physical activities Evidence for depression or mood disorders  The patient's weight, height, BMI, and visual acuity have been recorded in the chart.  I have made referrals, counseling, and provided education to the patient based on review of the above and I have provided the patient with a written personalized care plan for preventive services.     Garnet Sierras, NP  06/22/2018   

## 2018-06-16 ENCOUNTER — Ambulatory Visit (INDEPENDENT_AMBULATORY_CARE_PROVIDER_SITE_OTHER): Payer: PPO | Admitting: Adult Health Nurse Practitioner

## 2018-06-16 ENCOUNTER — Ambulatory Visit: Payer: Self-pay | Admitting: Physician Assistant

## 2018-06-16 ENCOUNTER — Encounter: Payer: Self-pay | Admitting: Adult Health Nurse Practitioner

## 2018-06-16 VITALS — BP 122/80 | HR 67 | Temp 97.8°F | Ht 64.0 in | Wt 114.4 lb

## 2018-06-16 DIAGNOSIS — J439 Emphysema, unspecified: Secondary | ICD-10-CM | POA: Diagnosis not present

## 2018-06-16 DIAGNOSIS — R6889 Other general symptoms and signs: Secondary | ICD-10-CM

## 2018-06-16 DIAGNOSIS — Z79899 Other long term (current) drug therapy: Secondary | ICD-10-CM

## 2018-06-16 DIAGNOSIS — K219 Gastro-esophageal reflux disease without esophagitis: Secondary | ICD-10-CM | POA: Diagnosis not present

## 2018-06-16 DIAGNOSIS — Z0001 Encounter for general adult medical examination with abnormal findings: Secondary | ICD-10-CM | POA: Diagnosis not present

## 2018-06-16 DIAGNOSIS — N280 Ischemia and infarction of kidney: Secondary | ICD-10-CM

## 2018-06-16 DIAGNOSIS — I700 Atherosclerosis of aorta: Secondary | ICD-10-CM | POA: Diagnosis not present

## 2018-06-16 DIAGNOSIS — K5792 Diverticulitis of intestine, part unspecified, without perforation or abscess without bleeding: Secondary | ICD-10-CM | POA: Diagnosis not present

## 2018-06-16 DIAGNOSIS — I10 Essential (primary) hypertension: Secondary | ICD-10-CM

## 2018-06-16 DIAGNOSIS — E559 Vitamin D deficiency, unspecified: Secondary | ICD-10-CM

## 2018-06-16 DIAGNOSIS — R7309 Other abnormal glucose: Secondary | ICD-10-CM | POA: Diagnosis not present

## 2018-06-16 DIAGNOSIS — E782 Mixed hyperlipidemia: Secondary | ICD-10-CM

## 2018-06-16 DIAGNOSIS — Z0000 Encounter for general adult medical examination without abnormal findings: Secondary | ICD-10-CM

## 2018-06-16 NOTE — Patient Instructions (Addendum)
Debra White , Thank you for taking time to come for your Medicare Wellness Visit. I appreciate your ongoing commitment to your health goals. Please review the following plan we discussed and let me know if I can assist you in the future.    This is a list of the screening recommended for you and due dates:  Health Maintenance  Topic Date Due  . Colon Cancer Screening  03/17/2017  . Tetanus Vaccine  08/08/2018*  . Flu Shot  Completed  . DEXA scan (bone density measurement)  Completed  . Pneumonia vaccines  Completed  *Topic was postponed. The date shown is not the original due date.     Preventive Care for Adults  A healthy lifestyle and preventive care can promote health and wellness. Preventive health guidelines for women include the following key practices.  A routine yearly physical is a good way to check with your health care provider about your health and preventive screening. It is a chance to share any concerns and updates on your health and to receive a thorough exam.  Visit your dentist for a routine exam and preventive care every 6 months. Brush your teeth twice a day and floss once a day. Good oral hygiene prevents tooth decay and gum disease.  The frequency of eye exams is based on your age, health, family medical history, use of contact lenses, and other factors. Follow your health care provider's recommendations for frequency of eye exams.  Eat a healthy diet. Foods like vegetables, fruits, whole grains, low-fat dairy products, and lean protein foods contain the nutrients you need without too many calories. Decrease your intake of foods high in solid fats, added sugars, and salt. Eat the right amount of calories for you.Get information about a proper diet from your health care provider, if necessary.  Regular physical exercise is one of the most important things you can do for your health. Most adults should get at least 150 minutes of moderate-intensity exercise (any  activity that increases your heart rate and causes you to sweat) each week. In addition, most adults need muscle-strengthening exercises on 2 or more days a week.  Maintain a healthy weight. The body mass index (BMI) is a screening tool to identify possible weight problems. It provides an estimate of body fat based on height and weight. Your health care provider can find your BMI and can help you achieve or maintain a healthy weight.For adults 20 years and older:  A BMI below 18.5 is considered underweight.  A BMI of 18.5 to 24.9 is normal.  A BMI of 25 to 29.9 is considered overweight.  A BMI of 30 and above is considered obese.  Maintain normal blood lipids and cholesterol levels by exercising and minimizing your intake of saturated fat. Eat a balanced diet with plenty of fruit and vegetables. Blood tests for lipids and cholesterol should begin at age 89 and be repeated every 5 years. If your lipid or cholesterol levels are high, you are over 50, or you are at high risk for heart disease, you may need your cholesterol levels checked more frequently.Ongoing high lipid and cholesterol levels should be treated with medicines if diet and exercise are not working.  If you smoke, find out from your health care provider how to quit. If you do not use tobacco, do not start.  Lung cancer screening is recommended for adults aged 54-80 years who are at high risk for developing lung cancer because of a history of smoking. A  yearly low-dose CT scan of the lungs is recommended for people who have at least a 30-pack-year history of smoking and are a current smoker or have quit within the past 15 years. A pack year of smoking is smoking an average of 1 pack of cigarettes a day for 1 year (for example: 1 pack a day for 30 years or 2 packs a day for 15 years). Yearly screening should continue until the smoker has stopped smoking for at least 15 years. Yearly screening should be stopped for people who develop a  health problem that would prevent them from having lung cancer treatment.  If you are pregnant, do not drink alcohol. If you are breastfeeding, be very cautious about drinking alcohol. If you are not pregnant and choose to drink alcohol, do not have more than 1 drink per day. One drink is considered to be 12 ounces (355 mL) of beer, 5 ounces (148 mL) of wine, or 1.5 ounces (44 mL) of liquor.  Avoid use of street drugs. Do not share needles with anyone. Ask for help if you need support or instructions about stopping the use of drugs.  High blood pressure causes heart disease and increases the risk of stroke. Your blood pressure should be checked at least every 1 to 2 years. Ongoing high blood pressure should be treated with medicines if weight loss and exercise do not work.  If you are 15-68 years old, ask your health care provider if you should take aspirin to prevent strokes.  Diabetes screening involves taking a blood sample to check your fasting blood sugar level. This should be done once every 3 years, after age 49, if you are within normal weight and without risk factors for diabetes. Testing should be considered at a younger age or be carried out more frequently if you are overweight and have at least 1 risk factor for diabetes.  Breast cancer screening is essential preventive care for women. You should practice "breast self-awareness." This means understanding the normal appearance and feel of your breasts and may include breast self-examination. Any changes detected, no matter how small, should be reported to a health care provider. Women in their 70s and 30s should have a clinical breast exam (CBE) by a health care provider as part of a regular health exam every 1 to 3 years. After age 54, women should have a CBE every year. Starting at age 42, women should consider having a mammogram (breast X-ray test) every year. Women who have a family history of breast cancer should talk to their health  care provider about genetic screening. Women at a high risk of breast cancer should talk to their health care providers about having an MRI and a mammogram every year.  Breast cancer gene (BRCA)-related cancer risk assessment is recommended for women who have family members with BRCA-related cancers. BRCA-related cancers include breast, ovarian, tubal, and peritoneal cancers. Having family members with these cancers may be associated with an increased risk for harmful changes (mutations) in the breast cancer genes BRCA1 and BRCA2. Results of the assessment will determine the need for genetic counseling and BRCA1 and BRCA2 testing.  Routine pelvic exams to screen for cancer are no longer recommended for nonpregnant women who are considered low risk for cancer of the pelvic organs (ovaries, uterus, and vagina) and who do not have symptoms. Ask your health care provider if a screening pelvic exam is right for you.  If you have had past treatment for cervical cancer or a  condition that could lead to cancer, you need Pap tests and screening for cancer for at least 20 years after your treatment. If Pap tests have been discontinued, your risk factors (such as having a new sexual partner) need to be reassessed to determine if screening should be resumed. Some women have medical problems that increase the chance of getting cervical cancer. In these cases, your health care provider may recommend more frequent screening and Pap tests.  The HPV test is an additional test that may be used for cervical cancer screening. The HPV test looks for the virus that can cause the cell changes on the cervix. The cells collected during the Pap test can be tested for HPV. The HPV test could be used to screen women aged 61 years and older, and should be used in women of any age who have unclear Pap test results. After the age of 12, women should have HPV testing at the same frequency as a Pap test.  Colorectal cancer can be detected  and often prevented. Most routine colorectal cancer screening begins at the age of 45 years and continues through age 58 years. However, your health care provider may recommend screening at an earlier age if you have risk factors for colon cancer. On a yearly basis, your health care provider may provide home test kits to check for hidden blood in the stool. Use of a small camera at the end of a tube, to directly examine the colon (sigmoidoscopy or colonoscopy), can detect the earliest forms of colorectal cancer. Talk to your health care provider about this at age 78, when routine screening begins. Direct exam of the colon should be repeated every 5-10 years through age 81 years, unless early forms of pre-cancerous polyps or small growths are found.  People who are at an increased risk for hepatitis B should be screened for this virus. You are considered at high risk for hepatitis B if:  You were born in a country where hepatitis B occurs often. Talk with your health care provider about which countries are considered high risk.  Your parents were born in a high-risk country and you have not received a shot to protect against hepatitis B (hepatitis B vaccine).  You have HIV or AIDS.  You use needles to inject street drugs.  You live with, or have sex with, someone who has hepatitis B.  You get hemodialysis treatment.  You take certain medicines for conditions like cancer, organ transplantation, and autoimmune conditions.  Hepatitis C blood testing is recommended for all people born from [birth year] through 1963 and any individual with known risks for hepatitis C.  Practice safe sex. Use condoms and avoid high-risk sexual practices to reduce the spread of sexually transmitted infections (STIs). STIs include gonorrhea, chlamydia, syphilis, trichomonas, herpes, HPV, and human immunodeficiency virus (HIV). Herpes, HIV, and HPV are viral illnesses that have no cure. They can result in disability, cancer, and  death.  You should be screened for sexually transmitted illnesses (STIs) including gonorrhea and chlamydia if:  You are sexually active and are younger than 24 years.  You are older than 24 years and your health care provider tells you that you are at risk for this type of infection.  Your sexual activity has changed since you were last screened and you are at an increased risk for chlamydia or gonorrhea. Ask your health care provider if you are at risk.  If you are at risk of being infected with HIV, it is recommended  that you take a prescription medicine daily to prevent HIV infection. This is called preexposure prophylaxis (PrEP). You are considered at risk if:  You are a heterosexual woman, are sexually active, and are at increased risk for HIV infection.  You take drugs by injection.  You are sexually active with a partner who has HIV.  Talk with your health care provider about whether you are at high risk of being infected with HIV. If you choose to begin PrEP, you should first be tested for HIV. You should then be tested every 3 months for as long as you are taking PrEP.  Osteoporosis is a disease in which the bones lose minerals and strength with aging. This can result in serious bone fractures or breaks. The risk of osteoporosis can be identified using a bone density scan. Women ages 54 years and over and women at risk for fractures or osteoporosis should discuss screening with their health care providers. Ask your health care provider whether you should take a calcium supplement or vitamin D to reduce the rate of osteoporosis.  Menopause can be associated with physical symptoms and risks. Hormone replacement therapy is available to decrease symptoms and risks. You should talk to your health care provider about whether hormone replacement therapy is right for you.  Use sunscreen. Apply sunscreen liberally and repeatedly throughout the day. You should seek shade when your shadow is  shorter than you. Protect yourself by wearing long sleeves, pants, a wide-brimmed hat, and sunglasses year round, whenever you are outdoors.  Once a month, do a whole body skin exam, using a mirror to look at the skin on your back. Tell your health care provider of new moles, moles that have irregular borders, moles that are larger than a pencil eraser, or moles that have changed in shape or color.  Stay current with required vaccines (immunizations).  Influenza vaccine. All adults should be immunized every year.  Tetanus, diphtheria, and acellular pertussis (Td, Tdap) vaccine. Pregnant women should receive 1 dose of Tdap vaccine during each pregnancy. The dose should be obtained regardless of the length of time since the last dose. Immunization is preferred during the 27th-36th week of gestation. An adult who has not previously received Tdap or who does not know her vaccine status should receive 1 dose of Tdap. This initial dose should be followed by tetanus and diphtheria toxoids (Td) booster doses every 10 years. Adults with an unknown or incomplete history of completing a 3-dose immunization series with Td-containing vaccines should begin or complete a primary immunization series including a Tdap dose. Adults should receive a Td booster every 10 years.  Varicella vaccine. An adult without evidence of immunity to varicella should receive 2 doses or a second dose if she has previously received 1 dose. Pregnant females who do not have evidence of immunity should receive the first dose after pregnancy. This first dose should be obtained before leaving the health care facility. The second dose should be obtained 4-8 weeks after the first dose.  Human papillomavirus (HPV) vaccine. Females aged 13-26 years who have not received the vaccine previously should obtain the 3-dose series. The vaccine is not recommended for use in pregnant females. However, pregnancy testing is not needed before receiving a dose.  If a female is found to be pregnant after receiving a dose, no treatment is needed. In that case, the remaining doses should be delayed until after the pregnancy. Immunization is recommended for any person with an immunocompromised condition through the  age of 2 years if she did not get any or all doses earlier. During the 3-dose series, the second dose should be obtained 4-8 weeks after the first dose. The third dose should be obtained 24 weeks after the first dose and 16 weeks after the second dose.  Zoster vaccine. One dose is recommended for adults aged 12 years or older unless certain conditions are present.  Measles, mumps, and rubella (MMR) vaccine. Adults born before [birth year] generally are considered immune to measles and mumps. Adults born in [birth year] or later should have 1 or more doses of MMR vaccine unless there is a contraindication to the vaccine or there is laboratory evidence of immunity to each of the three diseases. A routine second dose of MMR vaccine should be obtained at least 28 days after the first dose for students attending postsecondary schools, health care workers, or international travelers. People who received inactivated measles vaccine or an unknown type of measles vaccine during 1963-1967 should receive 2 doses of MMR vaccine. People who received inactivated mumps vaccine or an unknown type of mumps vaccine before 1979 and are at high risk for mumps infection should consider immunization with 2 doses of MMR vaccine. For females of childbearing age, rubella immunity should be determined. If there is no evidence of immunity, females who are not pregnant should be vaccinated. If there is no evidence of immunity, females who are pregnant should delay immunization until after pregnancy. Unvaccinated health care workers born before [birth year] who lack laboratory evidence of measles, mumps, or rubella immunity or laboratory confirmation of disease should consider measles and mumps immunization with 2  doses of MMR vaccine or rubella immunization with 1 dose of MMR vaccine.  Pneumococcal 13-valent conjugate (PCV13) vaccine. When indicated, a person who is uncertain of her immunization history and has no record of immunization should receive the PCV13 vaccine. An adult aged 60 years or older who has certain medical conditions and has not been previously immunized should receive 1 dose of PCV13 vaccine. This PCV13 should be followed with a dose of pneumococcal polysaccharide (PPSV23) vaccine. The PPSV23 vaccine dose should be obtained at least 8 weeks after the dose of PCV13 vaccine. An adult aged 39 years or older who has certain medical conditions and previously received 1 or more doses of PPSV23 vaccine should receive 1 dose of PCV13. The PCV13 vaccine dose should be obtained 1 or more years after the last PPSV23 vaccine dose.  Pneumococcal polysaccharide (PPSV23) vaccine. When PCV13 is also indicated, PCV13 should be obtained first. All adults aged 51 years and older should be immunized. An adult younger than age 31 years who has certain medical conditions should be immunized. Any person who resides in a nursing home or long-term care facility should be immunized. An adult smoker should be immunized. People with an immunocompromised condition and certain other conditions should receive both PCV13 and PPSV23 vaccines. People with human immunodeficiency virus (HIV) infection should be immunized as soon as possible after diagnosis. Immunization during chemotherapy or radiation therapy should be avoided. Routine use of PPSV23 vaccine is not recommended for American Indians, [place] Natives, or people younger than 65 years unless there are medical conditions that require PPSV23 vaccine. When indicated, people who have unknown immunization and have no record of immunization should receive PPSV23 vaccine. One-time revaccination 5 years after the first dose of PPSV23 is recommended for people aged 19-64 years who  have chronic kidney failure, nephrotic syndrome, asplenia, or immunocompromised conditions. People who received 1-2  doses of PPSV23 before age 96 years should receive another dose of PPSV23 vaccine at age 71 years or later if at least 5 years have passed since the previous dose. Doses of PPSV23 are not needed for people immunized with PPSV23 at or after age 53 years.  Meningococcal vaccine. Adults with asplenia or persistent complement component deficiencies should receive 2 doses of quadrivalent meningococcal conjugate (MenACWY-D) vaccine. The doses should be obtained at least 2 months apart. Microbiologists working with certain meningococcal bacteria, [place] recruits, people at risk during an outbreak, and people who travel to or live in countries with a high rate of meningitis should be immunized. A first-year college student up through age 17 years who is living in a residence hall should receive a dose if she did not receive a dose on or after her 16th birthday. Adults who have certain high-risk conditions should receive one or more doses of vaccine.  Hepatitis A vaccine. Adults who wish to be protected from this disease, have certain high-risk conditions, work with hepatitis A-infected animals, work in hepatitis A research labs, or travel to or work in countries with a high rate of hepatitis A should be immunized. Adults who were previously unvaccinated and who anticipate close contact with an international adoptee during the first 60 days after arrival in the [place] from a country with a high rate of hepatitis A should be immunized.  Hepatitis B vaccine. Adults who wish to be protected from this disease, have certain high-risk conditions, may be exposed to blood or other infectious body fluids, are household contacts or sex partners of hepatitis B positive people, are clients or workers in certain care facilities, or travel to or work in countries with a high rate of hepatitis B should be  immunized.  Haemophilus influenzae type b (Hib) vaccine. A previously unvaccinated person with asplenia or sickle cell disease or having a scheduled splenectomy should receive 1 dose of Hib vaccine. Regardless of previous immunization, a recipient of a hematopoietic stem cell transplant should receive a 3-dose series 6-12 months after her successful transplant. Hib vaccine is not recommended for adults with HIV infection. Preventive Services / Frequency  Ages 22 to 85 years  Blood pressure check.  Lipid and cholesterol check.  Clinical breast exam.** / Every 3 years for women in their 8s and 68s.  BRCA-related cancer risk assessment.** / For women who have family members with a BRCA-related cancer (breast, ovarian, tubal, or peritoneal cancers).  Pap test.** / Every 2 years from ages 16 through 52. Every 3 years starting at age 76 through age 51 or 19 with a history of 3 consecutive normal Pap tests.  HPV screening.** / Every 3 years from ages 65 through ages 53 to 85 with a history of 3 consecutive normal Pap tests.  Hepatitis C blood test.** / For any individual with known risks for hepatitis C.  Skin self-exam. / Monthly.  Influenza vaccine. / Every year.  Tetanus, diphtheria, and acellular pertussis (Tdap, Td) vaccine.** / Consult your health care provider. Pregnant women should receive 1 dose of Tdap vaccine during each pregnancy. 1 dose of Td every 10 years.  Varicella vaccine.** / Consult your health care provider. Pregnant females who do not have evidence of immunity should receive the first dose after pregnancy.  HPV vaccine. / 3 doses over 6 months, if 10 and younger. The vaccine is not recommended for use in pregnant females. However, pregnancy testing is not needed before receiving a dose.  Measles,  mumps, rubella (MMR) vaccine.** / You need at least 1 dose of MMR if you were born in 1957 or later. You may also need a 2nd dose. For females of childbearing age, rubella  immunity should be determined. If there is no evidence of immunity, females who are not pregnant should be vaccinated. If there is no evidence of immunity, females who are pregnant should delay immunization until after pregnancy.  Pneumococcal 13-valent conjugate (PCV13) vaccine.** / Consult your health care provider.  Pneumococcal polysaccharide (PPSV23) vaccine.** / 1 to 2 doses if you smoke cigarettes or if you have certain conditions.  Meningococcal vaccine.** / 1 dose if you are age 40 to 47 years and a Market researcher living in a residence hall, or have one of several medical conditions, you need to get vaccinated against meningococcal disease. You may also need additional booster doses.  Hepatitis A vaccine.** / Consult your health care provider.  Hepatitis B vaccine.** / Consult your health care provider.  Haemophilus influenzae type b (Hib) vaccine.** / Consult your health care provider. ++++++++ Recommend Adult Low Dose Aspirin or  coated  Aspirin 81 mg daily  To reduce risk of Colon Cancer 20 %,  Skin Cancer 26 % ,  Melanoma 46%  and  Pancreatic cancer 60% ++++++++++++++++++ Vitamin D goal  is between 70-100.  Please make sure that you are taking your Vitamin D as directed.  It is very important as a natural anti-inflammatory  helping hair, skin, and nails, as well as reducing stroke and heart attack risk.  It helps your bones and helps with mood. It also decreases numerous cancer risks so please take it as directed.  Low Vit D is associated with a 200-300% higher risk for CANCER  and 200-300% higher risk for HEART   ATTACK  &  STROKE.   .....................................Marland Kitchen It is also associated with higher death rate at younger ages,  autoimmune diseases like Rheumatoid arthritis, Lupus, Multiple Sclerosis.    Also many other serious conditions, like depression, Alzheimer's Dementia, infertility, muscle aches, fatigue, fibromyalgia - just to name a  few. ++++++++++++++++++++ Recommend the book "The END of DIETING" by Dr Excell Seltzer  & the book "The END of DIABETES " by Dr Excell Seltzer At [email or web address] - get book & Audio CD's    Being diabetic has a  300% increased risk for heart attack, stroke, cancer, and alzheimer- type vascular dementia. It is very important that you work harder with diet by avoiding all foods that are white. Avoid white rice (brown & wild rice is OK), white potatoes (sweetpotatoes in moderation is OK), White bread or wheat bread or anything made out of white flour like bagels, donuts, rolls, buns, biscuits, cakes, pastries, cookies, pizza crust, and pasta (made from white flour & egg whites) - vegetarian pasta or spinach or wheat pasta is OK. Multigrain breads like [employer] or [employer], or multigrain sandwich thins or flatbreads.  Diet, exercise and weight loss can reverse and cure diabetes in the early stages.  Diet, exercise and weight loss is very important in the control and prevention of complications of diabetes which affects every system in your body, ie. Brain - dementia/stroke, eyes - glaucoma/blindness, heart - heart attack/heart failure, kidneys - dialysis, stomach - gastric paralysis, intestines - malabsorption, nerves - severe painful neuritis, circulation - gangrene & loss of a leg(s), and finally cancer and Alzheimers.    I recommend avoid fried & greasy foods,  sweets/candy, white rice (brown or wild rice  or Quinoa is OK), white potatoes (sweet potatoes are OK) - anything made from white flour - bagels, doughnuts, rolls, buns, biscuits,white and wheat breads, pizza crust and traditional pasta made of white flour & egg white(vegetarian pasta or spinach or wheat pasta is OK).  Multi-grain bread is OK - like multi-grain flat bread or sandwich thins. Avoid alcohol in excess. Exercise is also important.    Eat all the vegetables you want - avoid meat, especially red meat and dairy - especially cheese.  Cheese is the  most concentrated form of trans-fats which is the worst thing to clog up our arteries. Veggie cheese is OK which can be found in the fresh produce section at [employer] or [employer] or [employer]  ++++++++++++++++++++ DASH Eating Plan  DASH stands for "Dietary Approaches to Stop Hypertension."   The DASH eating plan is a healthy eating plan that has been shown to reduce high blood pressure (hypertension). Additional health benefits may include reducing the risk of type 2 diabetes mellitus, heart disease, and stroke. The DASH eating plan may also help with weight loss. WHAT DO I NEED TO KNOW ABOUT THE DASH EATING PLAN? For the DASH eating plan, you will follow these general guidelines:  Choose foods with a percent daily value for sodium of less than 5% (as listed on the food label).  Use salt-free seasonings or herbs instead of table salt or sea salt.  Check with your health care provider or pharmacist before using salt substitutes.  Eat lower-sodium products, often labeled as "lower sodium" or "no salt added."  Eat fresh foods.  Eat more vegetables, fruits, and low-fat dairy products.  Choose whole grains. Look for the word "whole" as the first word in the ingredient list.  Choose fish   Limit sweets, desserts, sugars, and sugary drinks.  Choose heart-healthy fats.  Eat veggie cheese   Eat more home-cooked food and less restaurant, buffet, and fast food.  Limit fried foods.  Cook foods using methods other than frying.  Limit canned vegetables. If you do use them, rinse them well to decrease the sodium.  When eating at a restaurant, ask that your food be prepared with less salt, or no salt if possible.                      WHAT FOODS CAN I EAT? Read Dr Fara Olden Fuhrman's books on The End of Dieting & The End of Diabetes  Grains Whole grain or whole wheat bread. Brown rice. Whole grain or whole wheat pasta. Quinoa, bulgur, and whole grain cereals. Low-sodium cereals. Corn  or whole wheat flour tortillas. Whole grain cornbread. Whole grain crackers. Low-sodium crackers.  Vegetables Fresh or frozen vegetables (raw, steamed, roasted, or grilled). Low-sodium or reduced-sodium tomato and vegetable juices. Low-sodium or reduced-sodium tomato sauce and paste. Low-sodium or reduced-sodium canned vegetables.   Fruits All fresh, canned (in natural juice), or frozen fruits.  Protein Products  All fish and seafood.  Dried beans, peas, or lentils. Unsalted nuts and seeds. Unsalted canned beans.  Dairy Low-fat dairy products, such as skim or 1% milk, 2% or reduced-fat cheeses, low-fat ricotta or cottage cheese, or plain low-fat yogurt. Low-sodium or reduced-sodium cheeses.  Fats and Oils Tub margarines without trans fats. Light or reduced-fat mayonnaise and salad dressings (reduced sodium). Avocado. Safflower, olive, or canola oils. Natural peanut or almond butter.  Other Unsalted popcorn and pretzels. The items listed above may not be a complete list of recommended foods or  beverages. Contact your dietitian for more options.  +++++++++++++++++++  WHAT FOODS ARE NOT RECOMMENDED? Grains/ White flour or wheat flour White bread. White pasta. White rice. Refined cornbread. Bagels and croissants. Crackers that contain trans fat.  Vegetables  Creamed or fried vegetables. Vegetables in a . Regular canned vegetables. Regular canned tomato sauce and paste. Regular tomato and vegetable juices.  Fruits Dried fruits. Canned fruit in light or heavy syrup. Fruit juice.  Meat and Other Protein Products Meat in general - RED meat & White meat.  Fatty cuts of meat. Ribs, chicken wings, all processed meats as bacon, sausage, bologna, salami, fatback, hot dogs, bratwurst and packaged luncheon meats.  Dairy Whole or 2% milk, cream, half-and-half, and cream cheese. Whole-fat or sweetened yogurt. Full-fat cheeses or blue cheese. Non-dairy creamers and whipped toppings. Processed  cheese, cheese spreads, or cheese curds.  Condiments Onion and garlic salt, seasoned salt, table salt, and sea salt. Canned and packaged gravies. Worcestershire sauce. Tartar sauce. Barbecue sauce. Teriyaki sauce. Soy sauce, including reduced sodium. Steak sauce. Fish sauce. Oyster sauce. Cocktail sauce. Horseradish. Ketchup and mustard. Meat flavorings and tenderizers. Bouillon cubes. Hot sauce. Tabasco sauce. Marinades. Taco seasonings. Relishes.  Fats and Oils Butter, stick margarine, lard, shortening and bacon fat. Coconut, palm kernel, or palm oils. Regular salad dressings.  Pickles and olives. Salted popcorn and pretzels.  The items listed above may not be a complete list of foods and beverages to avoid.

## 2018-06-19 ENCOUNTER — Telehealth: Payer: Self-pay | Admitting: Cardiovascular Disease

## 2018-06-19 ENCOUNTER — Telehealth: Payer: Self-pay | Admitting: *Deleted

## 2018-06-19 DIAGNOSIS — E782 Mixed hyperlipidemia: Secondary | ICD-10-CM

## 2018-06-19 NOTE — Telephone Encounter (Signed)
New Message    Pt c/o medication issue:  1. Name of Medication: Pravastatin Sodium (Tab)  2. How are you currently taking this medication (dosage and times per day)? Not taking it   3. Are you having a reaction (difficulty breathing--STAT)? No  4. What is your medication issue? Pt was told to stop taking it for 2 weeks due to leg pain but is still having leg pain

## 2018-06-19 NOTE — Telephone Encounter (Signed)
Patient called and reported she has not been taking Pravastatin x 2 weeks, per her heart doctor.  Her myalgias improved only slightly.  Petr Dr [name], she can restart 20 mg and see if her myalgia worsens again. Patient is aware.

## 2018-06-19 NOTE — Telephone Encounter (Signed)
Pt calling to inquire if she need to resume her pravastatin. She states she was instructed to hold it for 2 weeks on 12/4 to see if it will help with her leg pain. Pt states she still has some aching in her leg, but not as bad as when she was taking the mediation. Will route to MD for recommendation.

## 2018-06-20 NOTE — Telephone Encounter (Signed)
Lets try lovastatin 20mg  instead.  Repeat lipids/CMP in 2 months.

## 2018-06-22 ENCOUNTER — Encounter: Payer: Self-pay | Admitting: Adult Health Nurse Practitioner

## 2018-06-23 MED ORDER — LOVASTATIN 20 MG PO TABS: 20.0000 mg | ORAL_TABLET | Freq: Every day | ORAL | 2 refills | Status: DC

## 2018-06-23 NOTE — Telephone Encounter (Signed)
Spoke with pt. Pt aware of Dr.Randolphs recommendation. D/c Pravastatin. Start Lovastatin 20mg  daily, Rx sent to pt pharmacy. Pt will come to the office in 2 months for fasting labs (lipid, cmet) lab orders in Epic and mailed to pt. Pt verbalized understanding to the instructions given and voiced appreciation for the call.

## 2018-06-26 ENCOUNTER — Telehealth: Payer: Self-pay | Admitting: Cardiovascular Disease

## 2018-06-26 DIAGNOSIS — Z5181 Encounter for therapeutic drug level monitoring: Secondary | ICD-10-CM | POA: Diagnosis not present

## 2018-06-26 DIAGNOSIS — Z01812 Encounter for preprocedural laboratory examination: Secondary | ICD-10-CM | POA: Diagnosis not present

## 2018-06-26 LAB — CBC WITH DIFFERENTIAL/PLATELET
BASOS ABS: 0 10*3/uL (ref 0.0–0.2)
Basos: 1 %
EOS (ABSOLUTE): 0 10*3/uL (ref 0.0–0.4)
Eos: 1 %
Hematocrit: 36.4 % (ref 34.0–46.6)
Hemoglobin: 12.3 g/dL (ref 11.1–15.9)
IMMATURE GRANS (ABS): 0 10*3/uL (ref 0.0–0.1)
Immature Granulocytes: 0 %
LYMPHS: 24 %
Lymphocytes Absolute: 1.7 10*3/uL (ref 0.7–3.1)
MCH: 30.9 pg (ref 26.6–33.0)
MCHC: 33.8 g/dL (ref 31.5–35.7)
MCV: 92 fL (ref 79–97)
Monocytes Absolute: 0.6 10*3/uL (ref 0.1–0.9)
Monocytes: 8 %
NEUTROS PCT: 66 %
Neutrophils Absolute: 4.6 10*3/uL (ref 1.4–7.0)
PLATELETS: 219 10*3/uL (ref 150–450)
RBC: 3.98 x10E6/uL (ref 3.77–5.28)
RDW: 12.5 % (ref 12.3–15.4)
WBC: 6.9 10*3/uL (ref 3.4–10.8)

## 2018-06-26 LAB — BASIC METABOLIC PANEL
BUN / CREAT RATIO: 13 (ref 12–28)
BUN: 10 mg/dL (ref 8–27)
CALCIUM: 9.5 mg/dL (ref 8.7–10.3)
CHLORIDE: 94 mmol/L — AB (ref 96–106)
CO2: 27 mmol/L (ref 20–29)
Creatinine, Ser: 0.79 mg/dL (ref 0.57–1.00)
GFR calc Af Amer: 81 mL/min/{1.73_m2} (ref >59)
GFR calc non Af Amer: 70 mL/min/{1.73_m2} (ref >59)
GLUCOSE: 88 mg/dL (ref 65–99)
Potassium: 5.1 mmol/L (ref 3.5–5.2)
Sodium: 133 mmol/L — ABNORMAL LOW (ref 134–144)

## 2018-06-26 NOTE — Telephone Encounter (Signed)
New Message         Patient is calling today to get a lab order put in, she states she got paper work to come in for labs. Pls call and advise.

## 2018-06-26 NOTE — Telephone Encounter (Signed)
Returned call to patient, she was wondering about the lab orders she received in the mail.  Advised these are lab orders to have completed in 2 months on the new medication.   She also states she is not going to be able to have her procedure on 1/7.  Her husband is sick in the hospital (patient tearful on the phone), unsure when he will get discharged.     Advised I can cancel for her and to call back when she is able to get this rescheduled.     Patient verbalized understanding.    Will make primary nurse aware to follow up

## 2018-07-01 ENCOUNTER — Encounter (HOSPITAL_COMMUNITY): Payer: Self-pay

## 2018-07-01 ENCOUNTER — Ambulatory Visit (HOSPITAL_COMMUNITY): Admit: 2018-07-01 | Payer: PPO | Admitting: Cardiovascular Disease

## 2018-07-01 SURGERY — ECHOCARDIOGRAM, TRANSESOPHAGEAL
Anesthesia: Moderate Sedation

## 2018-07-02 DIAGNOSIS — M25511 Pain in right shoulder: Secondary | ICD-10-CM | POA: Diagnosis not present

## 2018-07-02 DIAGNOSIS — M25551 Pain in right hip: Secondary | ICD-10-CM | POA: Diagnosis not present

## 2018-07-02 DIAGNOSIS — S40011A Contusion of right shoulder, initial encounter: Secondary | ICD-10-CM | POA: Diagnosis not present

## 2018-07-14 ENCOUNTER — Other Ambulatory Visit: Payer: Self-pay | Admitting: Internal Medicine

## 2018-07-17 ENCOUNTER — Ambulatory Visit: Payer: PPO | Admitting: Cardiology

## 2018-07-21 ENCOUNTER — Other Ambulatory Visit: Payer: Self-pay | Admitting: Internal Medicine

## 2018-07-21 DIAGNOSIS — Z1231 Encounter for screening mammogram for malignant neoplasm of breast: Secondary | ICD-10-CM

## 2018-07-21 NOTE — Progress Notes (Signed)
Subjective:    Patient ID: Debra White, female    DOB: 1934/05/01, 85 y.o.   MRN: [id number]  HPI 85 y.o. WF with history of facial pain and chronic headache after traumatic fall, with recent splenic and renal infarct now on eliquis 5 mg BID presents with headache.  She states she has had a lot of stress with estel, she will have headache bilateral temple and back of her neck. States rubbing her neck can help it. Will come and go. She has had nausea, incoordination, and HA getting worse today. No focal weakness.   She is on klonopin 1 mg 1/2 in the AM and 1/2 at night.  She is on gabapentin 1/2 in the AM, 1/2 in the middle of the day and 1 at night.  MRI 02/2018  Blood pressure 136/78, pulse 63, temperature 98 F (36.7 C), height 5\' 4"  (1.626 m), weight 110 lb (49.9 kg), SpO2 97 %.    Medications Current Outpatient Medications on File Prior to Visit  Medication Sig  . acetaminophen (TYLENOL) 500 MG tablet Take 500-1,000 mg by mouth every 6 (six) hours as needed for headache.  Marland Kitchen atenolol (TENORMIN) 100 MG tablet Take 50 mg by mouth 2 (two) times daily.  . Cholecalciferol (VITAMIN D) 2000 units tablet Take 4,000 Units by mouth daily.   . clonazePAM (KLONOPIN) 1 MG tablet TAKE 1/2 TO 1 TABLET BY MOUTH TWO TO THREE TIMES DAILY ONLY IF NEEDEDFOR ANXIETY ATTACK AND LIMIT TO 5 DAYS PER WEEK TO AVOID ADDICTION  . diclofenac sodium (VOLTAREN) 1 % GEL Apply 2 g topically 4 (four) times daily.  Marland Kitchen dicyclomine (BENTYL) 10 MG capsule Take 1 capsule (10 mg total) by mouth 3 (three) times daily before meals.  Marland Kitchen ELIQUIS 5 MG TABS tablet TAKE 1 TABLET BY MOUTH TWICE DAILY  . famotidine (PEPCID) 40 MG tablet Take 1 tablet (40 mg total) by mouth daily.  Marland Kitchen gabapentin (NEURONTIN) 600 MG tablet Take 1/2 to 1 tablet s 3 to 4 x / day as needed for Facial Pain (Patient taking differently: Take 300-600 mg by mouth See admin instructions. Take 300 mg in the morning, 300 mg at 1400 and 600 mg at bedtime for facial  pain)  . hyoscyamine (LEVSIN SL) 0.125 MG SL tablet Place 0.125 mg under the tongue every 4 (four) hours as needed. 1-2 by mouth every 4 hours as needed  . lovastatin (MEVACOR) 20 MG tablet Take 1 tablet (20 mg total) by mouth at bedtime.  Marland Kitchen olmesartan (BENICAR) 40 MG tablet Take 1/2 to 1 tablet daily for BP  . ondansetron (ZOFRAN) 8 MG tablet Take 1 tablet 3 x  /day if needed for Nausea  . promethazine (PHENERGAN) 25 MG tablet Take 1 or 2 tablets 4 x /day if needed for Nausea  . traMADol (ULTRAM) 50 MG tablet TAKE 1 TABLET BY MOUTH 4 TIMES DAILY AS NEEDED FOR PAIN (Patient taking differently: Take 50 mg by mouth 4 (four) times daily as needed (pain). )  . [DISCONTINUED] omeprazole (PRILOSEC OTC) 20 MG tablet Take 20 mg by mouth daily.   No current facility-administered medications on file prior to visit.     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   Review of Systems  Constitutional: Negative.  Negative for chills and fever.  HENT: Negative for tinnitus.   Eyes: Positive for photophobia and visual disturbance. Negative for pain, discharge, redness and itching.  Respiratory: Negative.   Cardiovascular: Negative.   Gastrointestinal: Negative.   Genitourinary: Negative.   Musculoskeletal: Negative.  Negative for arthralgias, neck pain and neck stiffness.  Neurological: Positive for dizziness and headaches. Negative for tremors, seizures, syncope, facial asymmetry, speech  difficulty, weakness, light-headedness and numbness.  Psychiatric/Behavioral: Negative.        Objective:   Physical Exam Vitals signs and nursing note reviewed.  Constitutional:      General: She is not in acute distress.    Appearance: She is well-developed. She is not diaphoretic.  HENT:     Head: Normocephalic and atraumatic.     Mouth/Throat:     Pharynx: No oropharyngeal exudate.  Eyes:     General: Lids are normal. Lids are everted, no foreign bodies appreciated. No scleral icterus.       Right eye: No foreign body, discharge or hordeolum.        Left eye: No foreign body, discharge or hordeolum.     Extraocular Movements:     Right eye: Normal extraocular motion and no nystagmus.     Left eye: Normal extraocular motion and no nystagmus.     Conjunctiva/sclera: Conjunctivae normal.     Right eye: Right conjunctiva is not injected. No chemosis, exudate or hemorrhage.    Left eye: Left conjunctiva is not injected. No chemosis, exudate or hemorrhage.    Pupils: Pupils are equal, round, and reactive to light.  Neck:     Musculoskeletal: Normal range of motion and neck supple.     Thyroid: No thyromegaly.     Vascular: No JVD.  Cardiovascular:     Rate and Rhythm: Normal rate and regular rhythm.     Heart sounds: Normal heart sounds. No murmur. No friction rub. No gallop.   Pulmonary:     Effort: Pulmonary effort is normal. No respiratory distress.     Breath sounds: Normal breath sounds. No wheezing or rales.  Chest:     Chest wall: No tenderness.  Musculoskeletal: Normal range of motion.  Lymphadenopathy:     Cervical: No cervical adenopathy.  Skin:    General: Skin is warm and dry.  Neurological:     Mental Status: She is alert and oriented to person, place, and time.  Psychiatric:        Behavior: Behavior normal.        Thought Content: Thought content normal.        Judgment: Judgment normal.           Assessment & Plan:  Malayla was seen today for  headache, generalized body aches, fatigue and eye pain.  Diagnoses and all orders for this visit:  Essential hypertension - continue medications, DASH diet, exercise and monitor at home. Call if greater than 130/80.   Chronic post-traumatic headache, not intractable with nausea, imbalance With new symptoms and on eliquis will get CT head STAT rule out [facility] Likely more facial pain/neck pain Suggest PT, heating pad, increase gabatpentin.  -     Ambulatory referral to Physical Therapy  Atypical facial pain Acute intractable headache, unspecified headache type -     CT Head Wo Contrast; Future   Future Appointments  Date Time Provider  [place]  09/01/2018 10:30 AM [facility] 2 [facility]  09/03/2018 11:15 AM Vicie Mutters, PA-C [facility] None  12/09/2018  9:00 AM Unk Pinto, MD [facility] None  05/08/2019 10:30 AM Vicie Mutters, PA-C [facility] None

## 2018-07-22 ENCOUNTER — Ambulatory Visit (INDEPENDENT_AMBULATORY_CARE_PROVIDER_SITE_OTHER): Payer: PPO | Admitting: Physician Assistant

## 2018-07-22 ENCOUNTER — Ambulatory Visit
Admission: RE | Admit: 2018-07-22 | Discharge: 2018-07-22 | Disposition: A | Discharge status: Home / Self Care | Payer: PPO | Source: Ambulatory Visit | Attending: Physician Assistant | Admitting: Physician Assistant

## 2018-07-22 ENCOUNTER — Encounter: Payer: Self-pay | Admitting: Physician Assistant

## 2018-07-22 VITALS — BP 136/78 | HR 63 | Temp 98.0°F | Ht 64.0 in | Wt 110.0 lb

## 2018-07-22 DIAGNOSIS — G44329 Chronic post-traumatic headache, not intractable: Secondary | ICD-10-CM

## 2018-07-22 DIAGNOSIS — I10 Essential (primary) hypertension: Secondary | ICD-10-CM | POA: Diagnosis not present

## 2018-07-22 DIAGNOSIS — R51 Headache: Principal | ICD-10-CM

## 2018-07-22 DIAGNOSIS — G501 Atypical facial pain: Secondary | ICD-10-CM

## 2018-07-22 DIAGNOSIS — R519 Headache, unspecified: Secondary | ICD-10-CM

## 2018-07-22 NOTE — Patient Instructions (Addendum)
TO HELP WITH THE NAUSEA  Stop the pepcid/famotidine Go home to see if you are on the omeprazole-  - if you are then increase to 2 a day  If you are not then [place] RESTART IT    will get CT scan head to rule out bleed  Increase the gabapentin to 400mg  or 1 pill at lunch and 1 pill at night  Will send to physical therapy for your neck pain and headache   What is the TMJ? The temporomandibular (tem-PUH-ro-man-DIB-yoo-ler) joint, or the TMJ, connects the upper and lower jawbones. This joint allows the jaw to open wide and move back and forth when you chew, talk, or yawn.There are also several muscles that help this joint move. There can be muscle tightness and pain in the muscle that can cause several symptoms.  What causes TMJ pain? There are many causes of TMJ pain. Repeated chewing (for example, chewing gum) and clenching your teeth can cause pain in the joint. Some TMJ pain has no obvious cause. What can I do to ease the pain? There are many things you can do to help your pain get better. When you have pain:  Eat soft foods and stay away from chewy foods (for example, taffy) Try to use both sides of your mouth to chew Don't chew gum Massage Don't open your mouth wide (for example, during yawning or singing) Don't bite your cheeks or fingernails Lower your amount of stress and worry Applying a warm, damp washcloth to the joint may help. Over-the-counter pain medicines such as ibuprofen (one brand: Advil) or acetaminophen (one brand: Tylenol) might also help. Do not use these medicines if you are allergic to them or if your doctor told you not to use them. How can I stop the pain from coming back? When your pain is better, you can do these exercises to make your muscles stronger and to keep the pain from coming back:  Resisted mouth opening: Place your thumb or two fingers under your chin and open your mouth slowly, pushing up lightly on your chin with your thumb. Hold for  three to six seconds. Close your mouth slowly. Resisted mouth closing: Place your thumbs under your chin and your two index fingers on the ridge between your mouth and the bottom of your chin. Push down lightly on your chin as you close your mouth. Tongue up: Slowly open and close your mouth while keeping the tongue touching the roof of the mouth. Side-to-side jaw movement: Place an object about one fourth of an inch thick (for example, two tongue depressors) between your front teeth. Slowly move your jaw from side to side. Increase the thickness of the object as the exercise becomes easier Forward jaw movement: Place an object about one fourth of an inch thick between your front teeth and move the bottom jaw forward so that the bottom teeth are in front of the top teeth. Increase the thickness of the object as the exercise becomes easier. These exercises should not be painful. If it hurts to do these exercises, stop doing them and talk to your family doctor.

## 2018-07-24 ENCOUNTER — Other Ambulatory Visit: Payer: Self-pay | Admitting: Internal Medicine

## 2018-07-29 DIAGNOSIS — M25511 Pain in right shoulder: Secondary | ICD-10-CM | POA: Diagnosis not present

## 2018-07-29 DIAGNOSIS — M75121 Complete rotator cuff tear or rupture of right shoulder, not specified as traumatic: Secondary | ICD-10-CM | POA: Diagnosis not present

## 2018-08-28 NOTE — Telephone Encounter (Signed)
Spoke with patient and scheduled her appointment for 09/22/18 with PA, patient did not want to come prior to then.

## 2018-09-01 ENCOUNTER — Ambulatory Visit
Admission: RE | Admit: 2018-09-01 | Discharge: 2018-09-01 | Disposition: A | Discharge status: Home / Self Care | Payer: PPO | Source: Ambulatory Visit | Attending: Internal Medicine | Admitting: Internal Medicine

## 2018-09-01 DIAGNOSIS — Z1231 Encounter for screening mammogram for malignant neoplasm of breast: Secondary | ICD-10-CM | POA: Diagnosis not present

## 2018-09-02 ENCOUNTER — Other Ambulatory Visit: Payer: Self-pay | Admitting: Internal Medicine
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
## 2018-09-03 ENCOUNTER — Ambulatory Visit (INDEPENDENT_AMBULATORY_CARE_PROVIDER_SITE_OTHER): Payer: PPO | Admitting: Physician Assistant

## 2018-09-03 ENCOUNTER — Other Ambulatory Visit: Payer: Self-pay

## 2018-09-03 ENCOUNTER — Encounter: Payer: Self-pay | Admitting: Physician Assistant

## 2018-09-03 VITALS — BP 132/68 | HR 68 | Temp 98.5°F | Ht 64.0 in | Wt 111.8 lb

## 2018-09-03 DIAGNOSIS — I10 Essential (primary) hypertension: Secondary | ICD-10-CM | POA: Diagnosis not present

## 2018-09-03 DIAGNOSIS — J439 Emphysema, unspecified: Secondary | ICD-10-CM

## 2018-09-03 DIAGNOSIS — Z79899 Other long term (current) drug therapy: Secondary | ICD-10-CM

## 2018-09-03 DIAGNOSIS — K2100 Gastro-esophageal reflux disease with esophagitis, without bleeding: Secondary | ICD-10-CM

## 2018-09-03 DIAGNOSIS — E441 Mild protein-calorie malnutrition: Secondary | ICD-10-CM | POA: Diagnosis not present

## 2018-09-03 DIAGNOSIS — I771 Stricture of artery: Secondary | ICD-10-CM

## 2018-09-03 DIAGNOSIS — E782 Mixed hyperlipidemia: Secondary | ICD-10-CM | POA: Diagnosis not present

## 2018-09-03 DIAGNOSIS — I700 Atherosclerosis of aorta: Secondary | ICD-10-CM | POA: Diagnosis not present

## 2018-09-03 DIAGNOSIS — N280 Ischemia and infarction of kidney: Secondary | ICD-10-CM | POA: Diagnosis not present

## 2018-09-03 MED ORDER — LOVASTATIN 20 MG PO TABS: 20.0000 mg | ORAL_TABLET | Freq: Every day | ORAL | 2 refills | Status: DC

## 2018-09-03 MED ORDER — OMEPRAZOLE 40 MG PO CPDR: 40.0000 mg | DELAYED_RELEASE_CAPSULE | Freq: Every day | ORAL | 1 refills | Status: DC

## 2018-09-03 NOTE — Patient Instructions (Signed)
Stop the famotidine and start the omeprazole 40mg  daily  Follow up with ortho  [place]  Know what a healthy weight is for you (roughly BMI <25) and aim to maintain this  Aim for 7+ servings of fruits and vegetables daily  70-80+ fluid ounces of water or unsweet tea for healthy kidneys  Limit to max 1 drink of alcohol per day; avoid smoking/tobacco  Limit animal fats in diet for cholesterol and heart health - choose grass fed whenever available  Avoid highly processed foods, and foods high in saturated/trans fats  Aim for low stress - take time to unwind and care for your mental health  Aim for 150 min of moderate intensity exercise weekly for heart health, and weights twice weekly for bone health  Aim for 7-9 hours of sleep daily  Coronavirus or Covid 19  . You will use the same precautions as you would with the flu virus.  . While we do not have a vaccine against Covid 19 and will not for several years, WE do have a vaccine against the flu. Please get this if you have not yet.  Marland Kitchen Please wash your hands frequently. Wendee Copp your hands before you eat or touch your face.  . Avoid touching your face, eyes, nose, mouth as much as possible.  . Routinely clean frequently touched items such as doorknobs, keyboards, and phones.  . Avoid crowds of people.   To get more information from reputable sources:  You can call this hotline set up by [facility] 1 877 40COVID (56256389373)  You can visit these websites: CDC.gov WHO.int  If you feel that you have come in contact with someone that may be infected with coronavirus.  Please stay at home.  Call our office.  Call the local health department.  [place] [phone])  After hours nurse triage line [phone])

## 2018-09-04 ENCOUNTER — Other Ambulatory Visit: Payer: Self-pay | Admitting: Internal Medicine

## 2018-09-04 LAB — COMPLETE METABOLIC PANEL WITH GFR
AG Ratio: 1.8 (calc) (ref 1.0–2.5)
ALBUMIN MSPROF: 4.7 g/dL (ref 3.6–5.1)
ALKALINE PHOSPHATASE (APISO): 60 U/L (ref 37–153)
ALT: 12 U/L (ref 6–29)
AST: 19 U/L (ref 10–35)
BUN/Creatinine Ratio: 13 (calc) (ref 6–22)
BUN: 12 mg/dL (ref 7–25)
CO2: 29 mmol/L (ref 20–32)
Calcium: 9.9 mg/dL (ref 8.6–10.4)
Chloride: 94 mmol/L — ABNORMAL LOW (ref 98–110)
Creat: 0.93 mg/dL — ABNORMAL HIGH (ref 0.60–0.88)
GFR, Est African American: 66 mL/min/{1.73_m2} (ref >=60)
GFR, Est Non African American: 57 mL/min/{1.73_m2} — ABNORMAL LOW (ref >=60)
GLUCOSE: 92 mg/dL (ref 65–99)
Globulin: 2.6 g/dL (calc) (ref 1.9–3.7)
Potassium: 4.9 mmol/L (ref 3.5–5.3)
Sodium: 132 mmol/L — ABNORMAL LOW (ref 135–146)
Total Bilirubin: 0.6 mg/dL (ref 0.2–1.2)
Total Protein: 7.3 g/dL (ref 6.1–8.1)

## 2018-09-04 LAB — CBC WITH DIFFERENTIAL/PLATELET
Absolute Monocytes: 673 cells/uL (ref 200–950)
Basophils Absolute: 40 cells/uL (ref 0–200)
Basophils Relative: 0.6 %
Eosinophils Absolute: 152 cells/uL (ref 15–500)
Eosinophils Relative: 2.3 %
HCT: 35.1 % (ref 35.0–45.0)
Hemoglobin: 12 g/dL (ref 11.7–15.5)
Lymphs Abs: 1630 cells/uL (ref 850–3900)
MCH: 31.5 pg (ref 27.0–33.0)
MCHC: 34.2 g/dL (ref 32.0–36.0)
MCV: 92.1 fL (ref 80.0–100.0)
MPV: 10.7 fL (ref 7.5–12.5)
Monocytes Relative: 10.2 %
NEUTROS PCT: 62.2 %
Neutro Abs: 4105 cells/uL (ref 1500–7800)
Platelets: 232 10*3/uL (ref 140–400)
RBC: 3.81 10*6/uL (ref 3.80–5.10)
RDW: 13 % (ref 11.0–15.0)
Total Lymphocyte: 24.7 %
WBC: 6.6 10*3/uL (ref 3.8–10.8)

## 2018-09-04 LAB — LIPID PANEL
CHOLESTEROL: 200 mg/dL — AB (ref <200)
HDL: 84 mg/dL (ref >=50)
LDL Cholesterol (Calc): 93 mg/dL (calc)
Non-HDL Cholesterol (Calc): 116 mg/dL (calc) (ref <130)
Total CHOL/HDL Ratio: 2.4 (calc) (ref <5.0)
Triglycerides: 125 mg/dL (ref <150)

## 2018-09-04 LAB — TSH: TSH: 0.79 mIU/L (ref 0.40–4.50)

## 2018-09-04 LAB — MAGNESIUM: Magnesium: 1.8 mg/dL (ref 1.5–2.5)

## 2018-09-05 ENCOUNTER — Other Ambulatory Visit: Payer: Self-pay | Admitting: Internal Medicine

## 2018-09-05 ENCOUNTER — Telehealth: Payer: Self-pay | Admitting: Physician Assistant

## 2018-09-05 MED ORDER — CIMETIDINE 200 MG PO TABS: 200.0000 mg | ORAL_TABLET | Freq: Two times a day (BID) | ORAL | 3 refills | Status: DC

## 2018-09-05 NOTE — Telephone Encounter (Signed)
Patient has been made aware & lab results have been mailed to her

## 2018-09-05 NOTE — Telephone Encounter (Signed)
-----   Message from [name], [place] sent at 09/04/2018  2:21 PM EDT ----- Regarding: poss med reaction Contact: [phone] New stomach meds have made her nauseous & she feels like she is going to "vomick" all day  Please advise  Please & thank you!

## 2018-09-05 NOTE — Telephone Encounter (Signed)
Please stop the medications, can get on new one that I'm sending in for you. Tagamet, it is more similar to zantac or the old one [place]

## 2018-09-06 ENCOUNTER — Other Ambulatory Visit: Payer: Self-pay | Admitting: Internal Medicine

## 2018-09-10 ENCOUNTER — Other Ambulatory Visit: Payer: Self-pay | Admitting: Physician Assistant

## 2018-09-18 ENCOUNTER — Telehealth: Payer: Self-pay

## 2018-09-18 NOTE — Telephone Encounter (Signed)
   Primary Cardiologist:  No primary care provider on file.   Patient contacted.  History reviewed.  No symptoms to suggest any unstable cardiac conditions.  Based on discussion, with current pandemic situation, we will be postponing this appointment for Debra White with a plan for f/u in 8-12 wks or sooner if feasible/necessary.  If symptoms change, she has been instructed to contact our office.     Ulice Brilliant T, CMA  09/18/2018 4:10 PM         .

## 2018-09-19 ENCOUNTER — Other Ambulatory Visit: Payer: Self-pay | Admitting: Internal Medicine

## 2018-09-22 ENCOUNTER — Ambulatory Visit: Payer: PPO | Admitting: Cardiology

## 2018-10-08 NOTE — Progress Notes (Signed)
[facility]  Unk Pinto, M.D.  Uvaldo Bristle. Silverio Lay, P.A.-C Liane Comber, [place] [address] [place], [place] [address] Telephone [phone] Telefax [phone]  History of Present Illness:      This delightful 85 y.o. MWFwith HTN, HLD, Pre-Diabetes, Chronic & acute Anxiety and Vitamin D Deficiency presents with c/o Right Shoulder & Right knee pains. She describes pains in the Rt shoulder with Abduction ans internal/external rotation. She also describes pain in the Right knee exacerbated with prolonged standing or walking. She denies any injury.   Medications  .  atenolol (TENORMIN) 100 MG tablet, TAKE 1 TABLET BY MOUTH DAILY FOR BLOOD PRESSURE .  lovastatin (MEVACOR) 20 MG tablet, Take 1 tablet (20 mg total) by mouth at bedtime. Marland Kitchen  olmesartan (BENICAR) 40 MG tablet, Take 1/2 to 1 tablet daily for BP .  promethazine (PHENERGAN) 25 MG tablet, Take 1 or 2 tablets 4 x /day if needed for Nausea .  acetaminophen (TYLENOL) 500 MG tablet, Take 500-1,000 mg by mouth every 6 (six) hours as needed for headache. .  traMADol (ULTRAM) 50 MG tablet, TAKE 1 TABLET BY MOUTH 4 TIMES DAILY AS NEEDED FOR PAIN (Patient taking differently: Take 50 mg by mouth 4 (four) times daily as needed (pain). ) .  ELIQUIS 5 MG TABS tablet, TAKE 1 TABLET BY MOUTH TWICE DAILY .  Cholecalciferol (VITAMIN D) 2000 units tablet, Take 4,000 Units by mouth daily.  .  cimetidine (TAGAMET) 200 MG tablet, Take 1 tablet (200 mg total) by mouth 2 (two) times daily. .  clonazePAM 1 MG tablet, TAKE 1/2 - 1 TABLET 2 - 3 TIMES DAILY ONLY IF NEEDED- .  diclofenac sodium (VOLTAREN) 1 % GEL, Apply 2 g topically 4 (fo) times daily before meals. .  famotidine (PEPCID) 40 MG tablet, TAKE 1 TABLET BY MOUTH DAILY .  gabapentin (NEURONTIN) 600 MG tablet, Take 1/2 to 1 tablet s 3 to 4 x / day as needed for Facial Pain .  gabapentin (NEURONTIN) 600 MG tablet,  Take 1/2 to 1 tablet 3 x /day for Facial Pain .  Hyoscyamine SL 0.125 MG SL tablet, Place 0.125 mg under tongue every 4) hours as needed .  omeprazole (PRILOSEC) 40 MG capsule, Take 1 capsule (40 mg total) by mouth daily before breakfast. .  ondansetron (ZOFRAN) 8 MG tablet, Take 1 tablet 3 x  /day if needed for Nausea  Problem list She has Headache, post-traumatic, chronic; Hypertension; Hyperlipidemia; GERD; Vitamin D deficiency; Atypical facial pain; Medication management; Encounter for Medicare annual wellness exam; Mild malnutrition ([place]); COPD (chronic obstructive pulmonary disease) with emphysema ([place]); Tortuous aorta ([place]); Osteoporosis; Aortic atherosclerosis ([place]); Hepatic steatosis; Splenic infarct; Renal infarct ([place]); Chronic hyponatremia; Chronic hyperkalemia; Right sided numbness; and Coronary atherosclerosis due to lipid rich plaque on their problem list.   Observations/Objective:  BP (!) 172/84   Pulse (!) 56   Temp (!) 97.2 F (36.2 C)   Resp 16   Ht 5\' 4"  (1.626 m)   Wt 113 lb (51.3 kg)   BMI 19.40 kg/m   HEENT - WNL. Neck - supple.  Chest - Clear equal BS. Cor - Nl HS. RRR w/o sig MGR. PP 1(+). No edema. MS-  Abduction & internal/external rotation of the Right shoulder limited by pain with tenderness along the anterior joint  Line. There is crepitus of the Rt knee & pain with ROM. No effusion is evident.   Gait sl limping.  Neuro -  Nl w/o focal abnormalities.  Assessment and Plan:  1. Chronic right shoulder pain  - dexamethasone (DECADRON) 4 MG tablet; Take 1 tab 3 x day - 3 days, then 2 x day - 3 days, then 1 tab daily  Dispense: 20 tablet; Refill: 0  2. Recurrent pain of right knee  - dexamethasone (DECADRON) 4 MG tablet; Take 1 tab 3 x day - 3 days, then 2 x day - 3 days, then 1 tab daily  Dispense: 20 tablet; Refill: 0  - discussed meds & SE's. Recommend f/u with Her orthopedist if sx's pers  Follow Up Instructions:       I discussed the assessment and  treatment plan with the patient. The patient was provided an opportunity to ask questions and all were answered. The patient agreed with the plan and demonstrated an understanding of the instructions. The patient was advised to call back or seek an in-person evaluation if the symptoms worsen or if the condition fails to improve as anticipated.      Over 20 minutes of exam, counseling, chart review and  critical decision making was performed   Kirtland Bouchard, MD

## 2018-10-09 ENCOUNTER — Other Ambulatory Visit: Payer: Self-pay

## 2018-10-09 ENCOUNTER — Ambulatory Visit (INDEPENDENT_AMBULATORY_CARE_PROVIDER_SITE_OTHER): Payer: PPO | Admitting: Internal Medicine

## 2018-10-09 VITALS — BP 172/84 | HR 56 | Temp 97.2°F | Resp 16 | Ht 64.0 in | Wt 113.0 lb

## 2018-10-09 DIAGNOSIS — M25561 Pain in right knee: Secondary | ICD-10-CM | POA: Diagnosis not present

## 2018-10-09 DIAGNOSIS — G8929 Other chronic pain: Secondary | ICD-10-CM | POA: Diagnosis not present

## 2018-10-09 DIAGNOSIS — M25511 Pain in right shoulder: Secondary | ICD-10-CM | POA: Diagnosis not present

## 2018-10-09 MED ORDER — DEXAMETHASONE 4 MG PO TABS: ORAL_TABLET | ORAL | 0 refills | Status: DC

## 2018-10-11 ENCOUNTER — Encounter: Payer: Self-pay | Admitting: Internal Medicine

## 2018-10-17 ENCOUNTER — Telehealth: Payer: Self-pay | Admitting: *Deleted

## 2018-10-17 NOTE — Telephone Encounter (Signed)
Left message for patient to call and reschedule 09/22/18 visit (virtual/telephone) with Dr. Oval Linsey

## 2018-10-23 ENCOUNTER — Other Ambulatory Visit: Payer: Self-pay

## 2018-10-23 ENCOUNTER — Ambulatory Visit (INDEPENDENT_AMBULATORY_CARE_PROVIDER_SITE_OTHER): Payer: PPO | Admitting: Internal Medicine

## 2018-10-23 ENCOUNTER — Ambulatory Visit: Payer: PPO | Admitting: Internal Medicine

## 2018-10-23 VITALS — BP 136/82 | HR 60 | Temp 97.0°F | Resp 16 | Ht 64.0 in | Wt 113.2 lb

## 2018-10-23 DIAGNOSIS — M791 Myalgia, unspecified site: Secondary | ICD-10-CM

## 2018-10-23 DIAGNOSIS — Z79899 Other long term (current) drug therapy: Secondary | ICD-10-CM

## 2018-10-23 DIAGNOSIS — M2550 Pain in unspecified joint: Secondary | ICD-10-CM | POA: Diagnosis not present

## 2018-10-23 DIAGNOSIS — G501 Atypical facial pain: Secondary | ICD-10-CM

## 2018-10-23 DIAGNOSIS — M353 Polymyalgia rheumatica: Secondary | ICD-10-CM

## 2018-10-23 MED ORDER — DEXAMETHASONE 4 MG PO TABS: ORAL_TABLET | ORAL | 0 refills | Status: DC
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
## 2018-10-23 NOTE — Patient Instructions (Addendum)
Increase Gabapentin 600 mg  to 1 whole tab in am,  1/2 tab in mid-day  & 1 whole tablet at nite for 1 to 2 weeks  Then Increase to  1 whole tablet 3 x /day  =======================================  Polymyalgia Rheumatica  Polymyalgia rheumatica (PMR) is an inflammatory disorder that causes aching and stiffness in your muscles and joints. Sometimes, PMR leads to a more dangerous condition (temporal arteritis or giant cell arteritis), which can cause vision loss. What are the causes? The exact cause of PMR is not known. What increases the risk? This condition is more likely to develop in:  Females.  People who are 25 years of age or older.  Caucasians. What are the signs or symptoms? Pain and stiffness are the main symptoms of PMR. Symptoms may start slowly or suddenly. The symptoms:  May be worse after inactivity and in the morning.  May affect your: ? Hips, buttocks, and thighs. ? Neck, arms, and shoulders. This can make it hard to raise your arms above your head. ? Hands and wrists. Other symptoms include:  Fever.  Tiredness.  Weakness.  Decreased appetite. This may lead to weight loss. How is this diagnosed? This condition is diagnosed with a medical history and physical exam. You may need to see a health care provider who specializes in diseases of the joint, muscles, and bones (rheumatologist). You may also have tests, including:  Blood tests.  X-rays. How is this treated? PMR usually goes away without treatment, but it may take years for that to happen. In the meantime, your health care provider may recommend low-dose steroids to help manage your symptoms of pain and stiffness. Regular exercise and rest will also help your symptoms. Follow these instructions at home:  Take over-the-counter and prescription medicines only as told by your health care provider.  Make sure to get enough rest and sleep.  Eat a healthy and nutritious diet.  Try to exercise most  days of the week. Ask your health care provider what type of exercise is best for you.  Keep all follow-up visits as told by your health are provider. This is important. Contact a health care provider if:  Your symptoms are not controlled with medicine.  You have side effects from steroids. These may include: ? Weight gain. ? Swelling. ? Insomnia. ? Mood changes. ? Bruising. ? High blood sugar readings, if you have diabetes. ? Higher than normal blood pressure readings, if you monitor your blood pressure. Get help right away if:  You develop symptoms of temporal arteritis, such as: ? A change in vision. ? Severe headache. ? Scalp pain. ? Jaw pain. This information is not intended to replace advice given to you by your health care provider. Make sure you discuss any questions you have with your health care provider. Document Released: 07/19/2004 Document Revised: 11/17/2015 Document Reviewed: 12/22/2014 Elsevier Interactive Patient Education  2019 [employer].

## 2018-10-24 ENCOUNTER — Encounter: Payer: Self-pay | Admitting: Internal Medicine

## 2018-10-24 LAB — CBC WITH DIFFERENTIAL/PLATELET
Absolute Monocytes: 1238 cells/uL — ABNORMAL HIGH (ref 200–950)
Basophils Absolute: 25 cells/uL (ref 0–200)
Basophils Relative: 0.2 %
Eosinophils Absolute: 325 cells/uL (ref 15–500)
Eosinophils Relative: 2.6 %
HCT: 37.9 % (ref 35.0–45.0)
Hemoglobin: 12.6 g/dL (ref 11.7–15.5)
Lymphs Abs: 1813 cells/uL (ref 850–3900)
MCH: 31 pg (ref 27.0–33.0)
MCHC: 33.2 g/dL (ref 32.0–36.0)
MCV: 93.3 fL (ref 80.0–100.0)
MPV: 10 fL (ref 7.5–12.5)
Monocytes Relative: 9.9 %
Neutro Abs: 9100 cells/uL — ABNORMAL HIGH (ref 1500–7800)
Neutrophils Relative %: 72.8 %
Platelets: 318 10*3/uL (ref 140–400)
RBC: 4.06 10*6/uL (ref 3.80–5.10)
RDW: 12.8 % (ref 11.0–15.0)
Total Lymphocyte: 14.5 %
WBC: 12.5 10*3/uL — ABNORMAL HIGH (ref 3.8–10.8)

## 2018-10-24 LAB — COMPLETE METABOLIC PANEL WITH GFR
AG Ratio: 1.7 (calc) (ref 1.0–2.5)
ALT: 21 U/L (ref 6–29)
AST: 20 U/L (ref 10–35)
Albumin: 4.1 g/dL (ref 3.6–5.1)
Alkaline phosphatase (APISO): 56 U/L (ref 37–153)
BUN: 15 mg/dL (ref 7–25)
CO2: 33 mmol/L — ABNORMAL HIGH (ref 20–32)
Calcium: 8.9 mg/dL (ref 8.6–10.4)
Chloride: 97 mmol/L — ABNORMAL LOW (ref 98–110)
Creat: 0.76 mg/dL (ref 0.60–0.88)
GFR, Est African American: 85 mL/min/{1.73_m2} (ref >=60)
GFR, Est Non African American: 73 mL/min/{1.73_m2} (ref >=60)
Globulin: 2.4 g/dL (calc) (ref 1.9–3.7)
Glucose, Bld: 105 mg/dL — ABNORMAL HIGH (ref 65–99)
Potassium: 4.5 mmol/L (ref 3.5–5.3)
Sodium: 135 mmol/L (ref 135–146)
Total Bilirubin: 0.5 mg/dL (ref 0.2–1.2)
Total Protein: 6.5 g/dL (ref 6.1–8.1)

## 2018-10-24 LAB — TSH: TSH: 0.71 mIU/L (ref 0.40–4.50)

## 2018-10-24 LAB — B. BURGDORFI ANTIBODIES: B burgdorferi Ab IgG+IgM: <0.9 index

## 2018-10-24 LAB — MAGNESIUM: Magnesium: 2 mg/dL (ref 1.5–2.5)

## 2018-10-24 LAB — SEDIMENTATION RATE: Sed Rate: 2 mm/h (ref 0–30)

## 2018-10-24 LAB — C-REACTIVE PROTEIN: CRP: 6.1 mg/L (ref <8.0)

## 2018-10-24 LAB — CK: Total CK: 27 U/L — ABNORMAL LOW (ref 29–143)

## 2018-10-24 LAB — ALDOLASE: Aldolase: 4.5 U/L (ref <=8.1)

## 2018-11-03 ENCOUNTER — Other Ambulatory Visit: Payer: Self-pay | Admitting: Physician Assistant

## 2018-11-05 ENCOUNTER — Telehealth: Payer: PPO | Admitting: Cardiovascular Disease

## 2018-11-05 ENCOUNTER — Other Ambulatory Visit: Payer: Self-pay | Admitting: Internal Medicine

## 2018-11-05 MED ORDER — AZITHROMYCIN 250 MG PO TABS: ORAL_TABLET | ORAL | 0 refills | Status: DC

## 2018-11-06 ENCOUNTER — Telehealth: Payer: Self-pay | Admitting: *Deleted

## 2018-11-06 NOTE — Telephone Encounter (Signed)
The patient was informed an RX was sent to her pharmacy for a Z-pak for her sinus infection, per Dr [name].

## 2018-11-11 ENCOUNTER — Telehealth: Payer: Self-pay | Admitting: Adult Health

## 2018-11-12 NOTE — Progress Notes (Deleted)
{Choose 1 Note Type (Telehealth Visit or Telephone Visit):[phone]}   Date:  11/12/2018   ID:  Debra White, DOB 08/02/1933, MRN [id number]  {Patient Location:[phone]::"Home"} {Provider Location:([phone]::"Home"}  PCP:  Unk Pinto, MD  Cardiologist:  Dr. Oval Linsey Electrophysiologist:  None   Evaluation Performed:  {Choose Visit Type:[phone]::"Follow-Up Visit"}  Chief Complaint:  ***  History of Present Illness:    Debra White is a 85 y.o. 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 She has had multiple admissions for splenic and renal infarct diverticulitis. She has frequent palpitations and myalgia pain.  She was last seen by Dr. Oval Linsey on 05/28/2018 and was planned for a TEE to rule out PFO, and possible ILR implant. She was not having significant cardiac symptoms and therefore no ischemic testing was planned. She was taken off of pravastatin and started on lovastatin. Repeat labs in March. She is followed by her PCP with Dr. [name] for multiple complaints.   The patient {does/does not:200015} have symptoms concerning for COVID-19 infection (fever, chills, cough, or new shortness of breath).    Past Medical History:  Diagnosis Date  . Anxiety   . Arthritis   . Blood transfusion   . C. difficile colitis   . Candida esophagitis ([place]) 2013   EGD  . Cataract   . Chronic kidney disease    STONES  . Diverticulitis 04/05/2017  . Diverticulosis of colon (without mention of hemorrhage) 2007   Colonoscopy  . Dysrhythmia    RX  . Emphysema of lung ([place])   . Family history of colon cancer    sister  . Fibromyalgia   . Fracture, zygoma closed ([place]) 07/13/2011  . GERD (gastroesophageal reflux disease)   . Headache(784.0)   . Hyperlipemia   . Hypertension   . Kidney stones   . Ptosis, bilateral   . Renal infarct ([place])   . Splenic infarct     Past Surgical History:  Procedure Laterality Date  . ABDOMINAL HYSTERECTOMY    . BACK SURGERY     X2  . BACK SURGERY    . BREAST EXCISIONAL BIOPSY    . CERVICAL DISC SURGERY    . COLONOSCOPY    . FACIAL FRACTURE SURGERY    . HAND SURGERY     BIL   . ORIF TRIPOD FRACTURE  07/13/2011   Procedure: OPEN REDUCTION INTERNAL FIXATION (ORIF) TRIPOD FRACTURE;  Surgeon: Tyson Alias, MD;  Location: [place];  Service: ENT;  Laterality: Right;  ORIF RIGHT ZYGOMA, ORBITAL FLOOR EXPLORATION WITH FROST STITCH (TEMPORARY TARSORRHAPHY)  . PTOSIS REPAIR Bilateral 11/22/2016   Procedure: INTERNAL PTOSIS REPAIR;  Surgeon: Clista Bernhardt, MD;  Location: [place];  Service: Ophthalmology;  Laterality: Bilateral;  . SHOULDER ARTHROSCOPY W/ ROTATOR CUFF REPAIR     LFT     No outpatient medications have been marked as taking for the 11/13/18 encounter (Appointment) with Lendon Colonel, NP.     Allergies:   Latex; Amoxicillin; Aspirin; Barbiturates; Celebrex [celecoxib]; Codeine; Evista [raloxifene]; Lipitor [atorvastatin]; Morphine and related; Oruvail [ketoprofen]; Pantoprazole; Paraffin; Prochlorperazine; Proloprim [trimethoprim]; Venlafaxine; Vibra-tab [doxycycline]; Vioxx [rofecoxib]; Betadine [povidone iodine]; and Caffeine   Social History   Tobacco Use  . Smoking status: Never Smoker  . Smokeless tobacco: Never Used  Substance Use Topics  . Alcohol use: No  . Drug use: No     Family Hx: The patient's family history includes Breast cancer (age  of onset: 6) in her sister; Cancer in her sister, sister, and sister; Colon cancer in her sister; Heart attack in her father; Heart disease in her father, mother, and sister; Hyperlipidemia in her daughter; Hypertension in her daughter.  ROS:   Please see the history of present illness.    *** All other systems reviewed and are negative.   Prior CV studies:   The following studies were reviewed today: Echocardiogram 03/23/2018 Left  ventricle: The cavity size was normal. Wall thickness was   normal. Systolic function was normal. The estimated ejection   fraction was in the range of 55% to 60%. Wall motion was normal;   there were no regional wall motion abnormalities. Left   ventricular diastolic function parameters were normal. - Aortic valve: There was trivial regurgitation. - Mitral valve: There was mild regurgitation.  7 Day Event Monitor (patient wore the monitor for 6 hours)  Quality: Fair.  Baseline artifact. Predominant rhythm: sinus bradycardia Average heart rate: 59 bpm Max heart rate: 77 bpm Min heart rate: 48 bpm Pauses >2.5 seconds: none   Labs/Other Tests and Data Reviewed:    EKG:  {EKG/Telemetry Strips Reviewed:[phone]}  Recent Labs: 10/23/2018: ALT 21; BUN 15; Creat 0.76; Hemoglobin 12.6; Magnesium 2.0; Platelets 318; Potassium 4.5; Sodium 135; TSH 0.71   Recent Lipid Panel Lab Results  Component Value Date/Time   CHOL 200 (H) 09/03/2018 11:33 AM   TRIG 125 09/03/2018 11:33 AM   HDL 84 09/03/2018 11:33 AM   CHOLHDL 2.4 09/03/2018 11:33 AM   LDLCALC 93 09/03/2018 11:33 AM    Wt Readings from Last 3 Encounters:  10/23/18 113 lb 3.2 oz (51.3 kg)  10/09/18 113 lb (51.3 kg)  09/03/18 111 lb 12.8 oz (50.7 kg)     Objective:    Vital Signs:  There were no vitals taken for this visit.   {Heart[facility] Virtual Exam (Optional):[phone]::"VITAL SIGNS:  reviewed"}  ASSESSMENT & PLAN:    1. ***  COVID-19 Education: The signs and symptoms of COVID-19 were discussed with the patient and how to seek care for testing (follow up with PCP or arrange E-visit).  ***The importance of social distancing was discussed today.  Time:   Today, I have spent *** minutes with the patient with telehealth technology discussing the above problems.     Medication Adjustments/Labs and Tests Ordered: Current medicines are reviewed at length with the patient today.  Concerns regarding medicines are  outlined above.   Tests Ordered: No orders of the defined types were placed in this encounter.   Medication Changes: No orders of the defined types were placed in this encounter.   Disposition:  Follow up {follow up:15908}  Signed, Phill Myron. West Pugh, ANP, AACC  11/12/2018 8:09 AM    [facility]

## 2018-11-13 ENCOUNTER — Telehealth: Payer: PPO | Admitting: Adult Health

## 2018-11-13 ENCOUNTER — Telehealth: Payer: PPO | Admitting: Cardiovascular Disease

## 2018-11-14 ENCOUNTER — Other Ambulatory Visit: Payer: Self-pay | Admitting: Physician Assistant

## 2018-11-18 NOTE — Progress Notes (Signed)
Virtual Visit via Telephone Note   This visit type was conducted due to national recommendations for restrictions regarding the COVID-19 Pandemic (e.g. social distancing) in an effort to limit this patient's exposure and mitigate transmission in our community.  Due to her co-morbid illnesses, this patient is at least at moderate risk for complications without adequate follow up.  This format is felt to be most appropriate for this patient at this time.  The patient did not have access to video technology/had technical difficulties with video requiring transitioning to audio format only (telephone).  All issues noted in this document were discussed and addressed.  No physical exam could be performed with this format.  Please refer to the patient's chart for her  consent to telehealth for [facility].   Date:  11/18/2018   ID:  Debra White, DOB 30-Oct-1933, MRN [id number]  Patient Location: Home Provider Location: Home  PCP:  Unk Pinto, MD  Cardiologist:  Oval Linsey  Electrophysiologist:  None   Evaluation Performed:  Follow-Up Visit  Chief Complaint:    History of Present Illness:    Debra White is a 85 y.o. female with known history of hypertension, atherosclerosis of the aorta and renal/splenic infarcts, chronic hyponatremia. She has been hospitalized on multiple occasions for splenic/renal infarct diverticulitis. Most recent echo on 03/23/2018 revealed EF of 55%-60%, with mild mitral regurgitation and a severely dilated LA. She also has palpitations and what she describes as anxiety attacks.  A TEE was ordered to assess for PFO, if no PFO was to have ILR placed. Pravastatin was discontinued due to complaints of myalgias. She was to continue Eliquis, she does not tolerate ASA due to GI upset.She was subsequently changed to lovastatin 20 mg instead. TEE was not completed by the time of this office visit.  She cancelled the TEE due to the [place] pandemic and is not  interested in having it done until it passes. She denies significant issues with racing HR or palpitations. She has complaints of chronic pain. She denies severe pain in her stomach but continues to have GI issues with diarrhea. She is medically compliant.  Labs are completed by PCP.    The patient does not have symptoms concerning for COVID-19 infection (fever, chills, cough, or new shortness of breath).    Past Medical History:  Diagnosis Date  . Anxiety   . Arthritis   . Blood transfusion   . C. difficile colitis   . Candida esophagitis ([place]) 2013   EGD  . Cataract   . Chronic kidney disease    STONES  . Diverticulitis 04/05/2017  . Diverticulosis of colon (without mention of hemorrhage) 2007   Colonoscopy  . Dysrhythmia    RX  . Emphysema of lung ([place])   . Family history of colon cancer    sister  . Fibromyalgia   . Fracture, zygoma closed ([place]) 07/13/2011  . GERD (gastroesophageal reflux disease)   . Headache(784.0)   . Hyperlipemia   . Hypertension   . Kidney stones   . Ptosis, bilateral   . Renal infarct ([place])   . Splenic infarct    Past Surgical History:  Procedure Laterality Date  . ABDOMINAL HYSTERECTOMY    . BACK SURGERY     X2  . BACK SURGERY    . BREAST EXCISIONAL BIOPSY    . CERVICAL DISC SURGERY    . COLONOSCOPY    . FACIAL FRACTURE SURGERY    . HAND SURGERY  BIL   . ORIF TRIPOD FRACTURE  07/13/2011   Procedure: OPEN REDUCTION INTERNAL FIXATION (ORIF) TRIPOD FRACTURE;  Surgeon: Tyson Alias, MD;  Location: [place];  Service: ENT;  Laterality: Right;  ORIF RIGHT ZYGOMA, ORBITAL FLOOR EXPLORATION WITH FROST STITCH (TEMPORARY TARSORRHAPHY)  . PTOSIS REPAIR Bilateral 11/22/2016   Procedure: INTERNAL PTOSIS REPAIR;  Surgeon: Clista Bernhardt, MD;  Location: [place];  Service: Ophthalmology;  Laterality: Bilateral;  . SHOULDER ARTHROSCOPY W/ ROTATOR CUFF REPAIR     LFT     No outpatient medications have been marked as taking for the 11/19/18 encounter  (Appointment) with Lendon Colonel, NP.     Allergies:   Latex; Amoxicillin; Aspirin; Barbiturates; Celebrex [celecoxib]; Codeine; Evista [raloxifene]; Lipitor [atorvastatin]; Morphine and related; Oruvail [ketoprofen]; Pantoprazole; Paraffin; Prochlorperazine; Proloprim [trimethoprim]; Venlafaxine; Vibra-tab [doxycycline]; Vioxx [rofecoxib]; Betadine [povidone iodine]; and Caffeine   Social History   Tobacco Use  . Smoking status: Never Smoker  . Smokeless tobacco: Never Used  Substance Use Topics  . Alcohol use: No  . Drug use: No     Family Hx: The patient's family history includes Breast cancer (age of onset: 44) in her sister; Cancer in her sister, sister, and sister; Colon cancer in her sister; Heart attack in her father; Heart disease in her father, mother, and sister; Hyperlipidemia in her daughter; Hypertension in her daughter.  ROS:   Please see the history of present illness.    All other systems reviewed and are negative.   Prior CV studies:   The following studies were reviewed today: Echo 03/23/18: Study Conclusions  - Left ventricle: The cavity size was normal. Wall thickness was normal. Systolic function was normal. The estimated ejection fraction was in the range of 55% to 60%. Wall motion was normal; there were no regional wall motion abnormalities. Left ventricular diastolic function parameters were normal. - Aortic valve: There was trivial regurgitation. - Mitral valve: There was mild regurgitation. - Left atrium: The atrium was severely dilated. - Right atrium: The atrium was mildly dilated. - Pulmonary arteries: Systolic pressure was mildly increased. PA peak pressure: 39 mm Hg (S).  Impressions:  - Normal LV function; biatrial enlargement; mild MR and TR; mild pulmonary hypertension.  7 Day Event Monitor 03/2018: (patient wore the monitor for 6 hours)  Quality: Fair.  Baseline artifact. Predominant rhythm: sinus bradycardia  Average heart rate: 59 bpm Max heart rate: 77 bpm Min heart rate: 48 bpm Pauses >2.5 seconds: none  No arrhythmias noted  Labs/Other Tests and Data Reviewed:    EKG:  No ECG reviewed.  Recent Labs: 10/23/2018: ALT 21; BUN 15; Creat 0.76; Hemoglobin 12.6; Magnesium 2.0; Platelets 318; Potassium 4.5; Sodium 135; TSH 0.71   Recent Lipid Panel Lab Results  Component Value Date/Time   CHOL 200 (H) 09/03/2018 11:33 AM   TRIG 125 09/03/2018 11:33 AM   HDL 84 09/03/2018 11:33 AM   CHOLHDL 2.4 09/03/2018 11:33 AM   LDLCALC 93 09/03/2018 11:33 AM    Wt Readings from Last 3 Encounters:  10/23/18 113 lb 3.2 oz (51.3 kg)  10/09/18 113 lb (51.3 kg)  09/03/18 111 lb 12.8 oz (50.7 kg)     Objective:    Vital Signs:  There were no vitals taken for this visit.   VITAL SIGNS:  reviewed GEN:  no acute distress NEURO:  alert and oriented x 3, no obvious focal deficit PSYCH:  normal affect  Hard of hearing.  ASSESSMENT & PLAN:    1.  Hypertension: Does not have capability to take home BP. She is medically compliant and states that her BP has been "good" when she sees her PCP. No changes in her regimen.   2. Splenic and Renal Infarcts: She remains on Eliquis. She was to have TEE but cancelled due to COVID 19 pandemic. This will be rescheduled on 3 month follow up as she is reluctant to do this now.   3. Palpitations: Usually occurring with anxiety. She states that they are very rare now and does not have significant issues with them at this time. Continue atenolol.   4.Chronic diverticular disease: Continues to have issues with diarrhea. Recommend she follow up with GI or PCP if symptoms become worse.  5. Hypercholesterolemia: Continue statin therapy.  Labs are completed by PCP.   COVID-19 Education: The signs and symptoms of COVID-19 were discussed with the patient and how to seek care for testing (follow up with PCP or arrange E-visit). The importance of social distancing was  discussed today.  Time:   Today, I have spent 10 minutes with the patient with telehealth technology discussing the above problems.     Medication Adjustments/Labs and Tests Ordered: Current medicines are reviewed at length with the patient today.  Concerns regarding medicines are outlined above.   Tests Ordered: No orders of the defined types were placed in this encounter.   Medication Changes: No orders of the defined types were placed in this encounter.   Disposition:  Follow up 3 months  Signed, Phill Myron. West Pugh, ANP, AACC  11/18/2018 2:18 PM    [facility]

## 2018-11-19 ENCOUNTER — Telehealth (INDEPENDENT_AMBULATORY_CARE_PROVIDER_SITE_OTHER): Payer: PPO | Admitting: Adult Health

## 2018-11-19 ENCOUNTER — Telehealth: Payer: PPO | Admitting: Adult Health

## 2018-11-19 VITALS — Ht 64.0 in | Wt 113.0 lb

## 2018-11-19 DIAGNOSIS — R002 Palpitations: Secondary | ICD-10-CM

## 2018-11-19 DIAGNOSIS — E7800 Pure hypercholesterolemia, unspecified: Secondary | ICD-10-CM

## 2018-11-19 DIAGNOSIS — I10 Essential (primary) hypertension: Secondary | ICD-10-CM

## 2018-11-19 DIAGNOSIS — N280 Ischemia and infarction of kidney: Secondary | ICD-10-CM

## 2018-11-19 DIAGNOSIS — D735 Infarction of spleen: Secondary | ICD-10-CM

## 2018-11-19 NOTE — Patient Instructions (Signed)
Follow-Up: You will need a follow up appointment in 3 months.  You may see Skeet Latch, MD or one of the following Advanced Practice Providers on your designated Care Team:  Kerin Ransom, [place]  Roby Lofts, PA-C  Sande Rives, [place]     Medication Instructions:  NO CHANGES- Your physician recommends that you continue on your current medications as directed. Please refer to the Current Medication list given to you today. If you need a refill on your cardiac medications before your next appointment, please call your pharmacy. Labwork: When you have labs (blood work) and your tests are completely normal, you will receive your results ONLY by [facility] (if you have MyChart) -OR- A paper copy in the mail.  At [facility], you and your health needs are our priority.  As part of our continuing mission to provide you with exceptional heart care, we have created designated Provider Care Teams.  These Care Teams include your primary Cardiologist (physician) and Advanced Practice Providers (APPs -  Physician Assistants and Nurse Practitioners) who all work together to provide you with the care you need, when you need it.  Thank you for choosing [facility] at [facility]!!

## 2018-11-27 ENCOUNTER — Ambulatory Visit: Payer: PPO | Admitting: Internal Medicine

## 2018-11-28 ENCOUNTER — Other Ambulatory Visit: Payer: Self-pay | Admitting: Internal Medicine

## 2018-12-02 ENCOUNTER — Encounter: Payer: Self-pay | Admitting: Adult Health Nurse Practitioner

## 2018-12-02 ENCOUNTER — Ambulatory Visit: Payer: PPO | Admitting: Adult Health Nurse Practitioner

## 2018-12-02 ENCOUNTER — Other Ambulatory Visit: Payer: Self-pay

## 2018-12-02 DIAGNOSIS — K5790 Diverticulosis of intestine, part unspecified, without perforation or abscess without bleeding: Secondary | ICD-10-CM

## 2018-12-02 DIAGNOSIS — N280 Ischemia and infarction of kidney: Secondary | ICD-10-CM | POA: Diagnosis not present

## 2018-12-02 DIAGNOSIS — M353 Polymyalgia rheumatica: Secondary | ICD-10-CM | POA: Diagnosis not present

## 2018-12-02 DIAGNOSIS — M25511 Pain in right shoulder: Secondary | ICD-10-CM

## 2018-12-02 DIAGNOSIS — K580 Irritable bowel syndrome with diarrhea: Secondary | ICD-10-CM

## 2018-12-02 DIAGNOSIS — K219 Gastro-esophageal reflux disease without esophagitis: Secondary | ICD-10-CM

## 2018-12-02 NOTE — Progress Notes (Addendum)
Virtual Visit via Telephone Note  I connected with Debra White on 12/02/18 at 11:30 AM EDT by telephone and verified that I am speaking with the correct person using two identifiers.   I discussed the limitations, risks, security and privacy concerns of performing an evaluation and management service by telephone and the availability of in person appointments. I also discussed with the patient that there may be a patient responsible charge related to this service. The patient expressed understanding and agreed to proceed.  Assessment and Plan:  Debra White was seen today for acute visit and other.  Diagnoses and all orders for this visit:  Irritable bowel syndrome with diarrhea Start taking Bentyl 10mg  three times a day before every meal! Continue Hyoscyamine 0.125mg  as Q4 PRN  Diverticular disease FODMAP diet Monitor for triggers  Gastroesophageal reflux disease, esophagitis presence not specified Continue famotadine 40mg , start taking twice a day  Arthralgia of right shoulder region Discussed using ice to area15-64min Use diclofenac gel 1%, QID PRN May use heat 15-20 at a time, do not use same time as gel application.  PMR (polymyalgia rheumatica) (HCC) Shoulder pain beginning of flare Continue to monitor symptoms If not improvement consider prednisone?  Renal & Splenic Infarct (HCC) Taking Eliquis 5mg  BID Blood pressure, lipid and glucose control Follows with cardiology  Follow Up Instructions:  Discussed assessment and treatment plan with the patient. The patient was provided an opportunity to ask questions and all were answered. The patient agrees with the plan of care and demonstrates an understanding of the instructions.   The patient was advised to call back or seek an in-person evaluation if the symptoms worsen or if the condition fails to improve as anticipated.   I provided 30 minutes of non-face-to-face time during this encounter including interview, counseling,  chart review, and critical decision making was preformed.    Future Appointments  Date Time Provider [place]  12/02/2018 11:30 AM Garnet Sierras, NP [facility] None  12/09/2018  9:00 AM Unk Pinto, MD [facility] None  03/05/2019 11:20 AM Skeet Latch, MD [facility]  05/08/2019 10:30 AM Vicie Mutters, PA-C [facility] None    ------------------------------------------------------------------------------------------------------------------   HPI 85 y.o.female presents for evaluation of stomach pains that has been going on for the past two days.  She reports that it is intermittent and feels like her stomach is jumping around.  She reports that eating makes it worse and it is especially upset at night.  Drinking liquids does not affect her stomach.  She reports she is taking pepcid 40mg  every morning.  She has had some nausea but denies any emesis.  She has had two loose stools a day with increase in gas and bloating.  Denies any constipation.  She has also taken hyoscyamine 0.125mg  dissolving tablet which has helped with her symptoms.  She also is having an increase in reflux symptoms.  She has omeprazole 40mg  on her medication list but not able to confirm that she is taking this medication? Last note from GI instructed her to take famotadine 40mg  twice a day and take the dicyclomine 10mg  three times a day before meals, NOT PRN.  High concern that she is not taking her medications correctly as she has not been following recommendations from GI.  She has not been doing with of these.  Patient is hard of hearing and has history of IBS, C diff, HTN, HLD, pre-diabetes, chronic & acute anxiety & VitD deficiency, PMR with chronic right shoulder arthralgias, facial neuralgias s/p facial/orbital fx,  splenic and renal infarct and on eliquis 5mg  BID. shoulder pain.   Also reports yesterday she was placing a full pitcher of water into the fridge that trigger right shoulder pains.   It has been sore since last night.  She has applied a heating pad to the area which has helped some.  She has not taken anything for this.  She has not applied any ice as it makes her too cold.  She reports she is able to complete full ROM with the arm but soreness with full arm extension to her scapula.  She also reports some positional numbness/tingles down her arm but after reposition it resolves.      Past Medical History:  Diagnosis Date  . Anxiety   . Arthritis   . Blood transfusion   . C. difficile colitis   . Candida esophagitis ([place]) 2013   EGD  . Cataract   . Chronic kidney disease    STONES  . Diverticulitis 04/05/2017  . Diverticulosis of colon (without mention of hemorrhage) 2007   Colonoscopy  . Dysrhythmia    RX  . Emphysema of lung ([place])   . Family history of colon cancer    sister  . Fibromyalgia   . Fracture, zygoma closed ([place]) 07/13/2011  . GERD (gastroesophageal reflux disease)   . Headache(784.0)   . Hyperlipemia   . Hypertension   . Kidney stones   . Ptosis, bilateral   . Renal infarct ([place])   . Splenic infarct      Allergies  Allergen Reactions  . Latex Hives    TONGUE HAD BLISTERS WHEN HAD DENTAL SURGERY  . Amoxicillin Nausea And Vomiting  . Aspirin Other (See Comments)    Gi upset  . Barbiturates Other (See Comments)    Unknown reaction  . Celebrex [Celecoxib] Other (See Comments)    Unknown reaction  . Codeine Itching  . Evista [Raloxifene] Other (See Comments)    Unknown reaction  . Lipitor [Atorvastatin] Other (See Comments)    Unknown reaction  . Morphine And Related Other (See Comments)    Unknown reaction  . Oruvail [Ketoprofen] Other (See Comments)    Unknown reaction  . Pantoprazole Diarrhea  . Paraffin Other (See Comments)    Unknown reaction  . Prochlorperazine Other (See Comments)    Uncontrolled shaking  . Proloprim [Trimethoprim] Other (See Comments)    Unknown reaction  . Venlafaxine Nausea Only  . Vibra-Tab  [Doxycycline] Other (See Comments)    Unknown reaction  . Vioxx [Rofecoxib] Other (See Comments)    edema  . Betadine [Povidone Iodine] Rash  . Caffeine Palpitations    Current Outpatient Medications on File Prior to Visit  Medication Sig  . acetaminophen (TYLENOL) 500 MG tablet Take 500-1,000 mg by mouth every 6 (six) hours as needed for headache.  Marland Kitchen atenolol (TENORMIN) 100 MG tablet TAKE 1 TABLET BY MOUTH DAILY FOR BLOOD PRESSURE  . azithromycin (ZITHROMAX) 250 MG tablet Take 2 tablets  on  Day 1,  followed by 1 tablet  daily on Days 2 through 5 for Infection  . Cholecalciferol (VITAMIN D) 2000 units tablet Take 4,000 Units by mouth daily.   . cimetidine (TAGAMET) 200 MG tablet Take 1 tablet (200 mg total) by mouth 2 (two) times daily.  . clonazePAM (KLONOPIN) 1 MG tablet TAKE 1/2 TO 1 TABLET BY MOUTH 2-3 TIMES DAILY ONLY IF NEEDED FOR ANXIETY ATTACK.PLEASE TRY TO LIMIT TO 5 DAYS PER WEEK TO AVOID ADDICTION  . dexamethasone (  DECADRON) 4 MG tablet Take 1 tab 3 x day - 5 days, then 2 x day - 5 days, then 1 tab daily for 5 days then 1 tablet every other day  . diclofenac sodium (VOLTAREN) 1 % GEL Apply 2 g topically 4 (four) times daily.  Marland Kitchen dicyclomine (BENTYL) 10 MG capsule Take 1 capsule (10 mg total) by mouth 3 (three) times daily before meals.  Marland Kitchen ELIQUIS 5 MG TABS tablet TAKE 1 TABLET BY MOUTH TWICE DAILY  . famotidine (PEPCID) 40 MG tablet TAKE 1 TABLET BY MOUTH DAILY  . gabapentin (NEURONTIN) 600 MG tablet Take 1/2 to 1 tablet s 3 to 4 x / day as needed for Facial Pain (Patient taking differently: Take 300-600 mg by mouth See admin instructions. Take 300 mg in the morning, 300 mg at 1400 and 600 mg at bedtime for facial pain)  . hyoscyamine (LEVSIN SL) 0.125 MG SL tablet DISSOLVE 1 TABLET UNDER THE TONGUE EVERY4 HOURS AS NEEDED FOR NAUSEA,CRAMPING, BLOATING OR DIARRHEA  . lovastatin (MEVACOR) 20 MG tablet Take 1 tablet (20 mg total) by mouth at bedtime.  Marland Kitchen olmesartan (BENICAR) 40 MG  tablet Take 1/2 to 1 tablet daily for BP  . omeprazole (PRILOSEC) 40 MG capsule Take 1 capsule (40 mg total) by mouth daily before breakfast.  . ondansetron (ZOFRAN) 8 MG tablet Take 1 tablet 3 x  /day if needed for Nausea  . promethazine (PHENERGAN) 25 MG tablet Take 1 or 2 tablets 4 x /day if needed for Nausea  . [DISCONTINUED] omeprazole (PRILOSEC OTC) 20 MG tablet Take 20 mg by mouth daily.   No current facility-administered medications on file prior to visit.     ROS: all negative except above.   Physical Exam:  There were no vitals taken for this visit. Patient is at home and does not have equipment to obtain vitals.  General : Well sounding patient in no apparent distress HEENT: Patient is hard of hearing, no hoarseness, no cough for duration of visit Lungs: speaks in complete sentences, no audible wheezing, no apparent distress Neurological: alert, oriented x 3 Psychiatric: pleasant, judgement appropriate     Garnet Sierras, NP 11:06 AM [facility]

## 2018-12-03 ENCOUNTER — Encounter: Payer: Self-pay | Admitting: Adult Health Nurse Practitioner

## 2018-12-08 ENCOUNTER — Encounter: Payer: Self-pay | Admitting: Internal Medicine

## 2018-12-08 DIAGNOSIS — M25511 Pain in right shoulder: Secondary | ICD-10-CM | POA: Diagnosis not present

## 2018-12-08 NOTE — Progress Notes (Signed)
[facility] Unk Pinto, M.D.     Uvaldo Bristle. Silverio Lay, P.A.-C Liane Comber, DNP _______________________________________________ [facility] [place] [address] Telephone [phone] Telefax [phone] Annual Screening/Preventative Visit & Comprehensive Evaluation &  Examination     This very nice 85 y.o. MWF  presents for a Screening /Preventative Visit & comprehensive evaluation and management of multiple medical co-morbidities.  Patient has been followed for HTN, HLD, Prediabetes  and Vitamin D Deficiency. Patient has GERD controlled on Prilosec & cimetadine /Famodipine. Patient is on Gabapentin  has long hx/o Atypical Facial Pain since facial injury and Orbital Fracture (2013).     Patient was hospitalized in Sept 2019 with renal & splenic infarcts and was started on Eliquis. Incidental Brain MRI did show a prior Rt occipital Infarct & Cardiac 2D echo was negative.  CHA2DS2vasc - 6).     HTN predates since 23. Patient's BP has been controlled at home and patient denies any cardiac symptoms as chest pain, palpitations, shortness of breath, dizziness or ankle swelling. Today's BP - 124/72 is at goal.     Patient's hyperlipidemia is controlled with diet and medications. Patient denies myalgias or other medication SE's. Last lipids were at goal: Lab Results  Component Value Date   CHOL 200 (H) 09/03/2018   HDL 84 09/03/2018   LDLCALC 93 09/03/2018   TRIG 125 09/03/2018   CHOLHDL 2.4 09/03/2018      Patient has hx/o T2_DM with A1c 6.8% in 2001 , but after a 30# weight loss, she had dropped to A1c 5.8% in in 2013 and more recently A1c 5.3% in 2016. Patient denies reactive hypoglycemic symptoms, visual blurring, diabetic polys or paresthesias. Last A1c was Normal & at goal: Lab Results  Component Value Date   HGBA1C 5.5 06/02/2018      Finally, patient has history of Vitamin D Deficiency and  last Vitamin D was at goal: Lab Results  Component Value Date   VD25OH 61 06/02/2018   Current Outpatient Medications on File Prior to Visit  Medication Sig  . acetaminophen 500 MG tablet Take 500-1,000 mg  every 6 hours as needed   . atenolol  100 MG tablet TAKE 1 TABLET  DAILY FOR BP  . VITAMIN D 2000 units  Take 4,000 Units  daily.   . clonazePAM (KLONOPIN) 1 MG tablet TAKE 1/2 TO 1 TABLET  2-3 TIMES DAILY O  . diclofenac  1 % GEL Apply 2 g topically 4 (four) times daily.  Marland Kitchen dicyclomine (BENTYL) 10 MG capsule Take 1 capsule   3  times daily before meals.  Marland Kitchen ELIQUIS 5 MG TABS tablet TAKE 1 TABLET  TWICE DAILY  . famotidine (PEPCID) 40 MG tablet TAKE 1 TABLET  DAILY  . hyoscyamine -SL 0.125 MG SL tablet 1 TABLET  EVERY 4 HOURS AS NEEDED   . lovastatin (M 20 MG tablet Take 1 tablet  at bedtime.  Marland Kitchen olmesartan  40 MG tablet Take 1/2 to 1 tablet daily for BP  . ondansetron 8 MG tablet Take 1 tablet 3 x  /day if needed for Nausea  . promethazine  25 MG tablet Take 1 or 2 tablets 4 x /day if needed for Nausea  . gabapentin 600 MG tablet Take 300 mg in the morning, 300 mg at 1400 and 600 mg at bedtime for facial pain)  . omeprazole  40 MG capsule Take 1 capsule  daily before breakfast.   Allergies  Allergen Reactions  . Latex Hives    TONGUE HAD BLISTERS WHEN HAD DENTAL SURGERY  . Amoxicillin Nausea And Vomiting  . Aspirin Other (See Comments)    Gi upset  . Barbiturates Other (See Comments)    Unknown reaction  . Celebrex [Celecoxib] Other (See Comments)    Unknown reaction  . Codeine Itching  . Evista [Raloxifene] Other (See Comments)    Unknown reaction  . Lipitor [Atorvastatin] Other (See Comments)    Unknown reaction  . Morphine And Related Other (See Comments)    Unknown reaction  . Oruvail [Ketoprofen] Other (See Comments)    Unknown reaction  . Pantoprazole Diarrhea  . Paraffin Other (See Comments)    Unknown reaction  . Prochlorperazine Other (See Comments)     Uncontrolled shaking  . Proloprim [Trimethoprim] Other (See Comments)    Unknown reaction  . Venlafaxine Nausea Only  . Vibra-Tab [Doxycycline] Other (See Comments)    Unknown reaction  . Vioxx [Rofecoxib] Other (See Comments)    edema  . Betadine [Povidone Iodine] Rash  . Caffeine Palpitations   Past Medical History:  Diagnosis Date  . Anxiety   . Arthritis   . Blood transfusion   . C. difficile colitis   . Candida esophagitis ([place]) 2013   EGD  . Cataract   . Chronic kidney disease    STONES  . Diverticulitis 04/05/2017  . Diverticulosis of colon (without mention of hemorrhage) 2007   Colonoscopy  . Dysrhythmia    RX  . Emphysema of lung ([place])   . Family history of colon cancer    sister  . Fibromyalgia   . Fracture, zygoma closed ([place]) 07/13/2011  . GERD (gastroesophageal reflux disease)   . Headache(784.0)   . Hyperlipemia   . Hypertension   . Kidney stones   . Ptosis, bilateral   . Renal infarct ([place])   . Splenic infarct    Health Maintenance  Topic Date Due  . TETANUS/TDAP  06/25/2013  . COLONOSCOPY  03/17/2017  . INFLUENZA VACCINE  01/24/2019  . DEXA SCAN  Completed  . PNA vac Low Risk Adult  Completed   Immunization History  Administered Date(s) Administered  . Influenza, High Dose Seasonal PF 03/25/2014, 04/05/2016, 06/02/2018  . Influenza-Unspecified 03/09/2013, 03/11/2015  . Pneumococcal Conjugate-13 12/21/2015  . Pneumococcal Polysaccharide-23 06/25/2002  . Td 06/26/2003   Last Colon - 03/17/2012 - Dr Sharlett Iles  - hyperplastic polyp - recc 5 yr f/u due (+) FHx   Last MGM - 09/01/2018  Past Surgical History:  Procedure Laterality Date  . ABDOMINAL HYSTERECTOMY    . BACK SURGERY     X2  . BACK SURGERY    . BREAST EXCISIONAL BIOPSY    . CERVICAL DISC SURGERY    . COLONOSCOPY    . FACIAL FRACTURE SURGERY    . HAND SURGERY     BIL   . ORIF TRIPOD FRACTURE  07/13/2011   Procedure: OPEN REDUCTION INTERNAL FIXATION (ORIF) TRIPOD FRACTURE;   Surgeon: Tyson Alias, MD;  Location: [place];  Service: ENT;  Laterality: Right;  ORIF RIGHT ZYGOMA, ORBITAL FLOOR EXPLORATION WITH FROST STITCH (TEMPORARY TARSORRHAPHY)  . PTOSIS REPAIR Bilateral 11/22/2016   Procedure: INTERNAL PTOSIS REPAIR;  Surgeon: Clista Bernhardt, MD;  Location: [place];  Service: Ophthalmology;  Laterality: Bilateral;  . SHOULDER ARTHROSCOPY W/ ROTATOR CUFF REPAIR     LFT   Family History  Problem Relation Age of Onset  . Heart disease Mother   .  Heart disease Father   . Heart attack Father   . Breast cancer Sister 58  . Colon cancer Sister   . Cancer Sister        breast  . Heart disease Sister   . Cancer Sister        colon  . Cancer Sister        colon  . Hypertension Daughter   . Hyperlipidemia Daughter    Social History   Tobacco Use  . Smoking status: Never Smoker  . Smokeless tobacco: Never Used  Substance Use Topics  . Alcohol use: No  . Drug use: No    ROS Constitutional: Denies fever, chills, weight loss/gain, headaches, insomnia,  night sweats, and change in appetite. Does c/o fatigue. Eyes: Denies redness, blurred vision, diplopia, discharge, itchy, watery eyes.  ENT: Denies discharge, congestion, post nasal drip, epistaxis, sore throat, earache, hearing loss, dental pain, Tinnitus, Vertigo, Sinus pain, snoring.  Cardio: Denies chest pain, palpitations, irregular heartbeat, syncope, dyspnea, diaphoresis, orthopnea, PND, claudication, edema Respiratory: denies cough, dyspnea, DOE, pleurisy, hoarseness, laryngitis, wheezing.  Gastrointestinal: Denies dysphagia, heartburn, reflux, water brash, pain, cramps, nausea, vomiting, bloating, diarrhea, constipation, hematemesis, melena, hematochezia, jaundice, hemorrhoids Genitourinary: Denies dysuria, frequency, urgency, nocturia, hesitancy, discharge, hematuria, flank pain Breast: Breast lumps, nipple discharge, bleeding.  Musculoskeletal: Denies arthralgia, myalgia, stiffness, Jt. Swelling,  pain, limp, and strain/sprain. Denies falls. Skin: Denies puritis, rash, hives, warts, acne, eczema, changing in skin lesion Neuro: No weakness, tremor, incoordination, spasms, paresthesia, pain Psychiatric: Denies confusion, memory loss, sensory loss. Denies Depression. Endocrine: Denies change in weight, skin, hair change, nocturia, and paresthesia, diabetic polys, visual blurring, hyper / hypo glycemic episodes.  Heme/Lymph: No excessive bleeding, bruising, enlarged lymph nodes.  Physical Exam  BP 124/72   Pulse 64   Temp (!) 97.4 F (36.3 C)   Resp 16   Ht 5' 3.5" (1.613 m)   Wt 112 lb 12.8 oz (51.2 kg)   BMI 19.67 kg/m   General Appearance: Well nourished, well groomed and in no apparent distress.  Eyes: PERRLA, EOMs, conjunctiva no swelling or erythema, normal fundi and vessels. Sinuses: No frontal/maxillary tenderness ENT/Mouth: EACs patent / TMs  nl. Nares clear without erythema, swelling, mucoid exudates. Oral hygiene is good. No erythema, swelling, or exudate. Tongue normal, non-obstructing. Tonsils not swollen or erythematous. Hearing normal.  Neck: Supple, thyroid not palpable. No bruits, nodes or JVD. Respiratory: Respiratory effort normal.  BS equal and clear bilateral without rales, rhonci, wheezing or stridor. Cardio: Heart sounds are normal with regular rate and rhythm and no murmurs, rubs or gallops. Peripheral pulses are normal and equal bilaterally without edema. No aortic or femoral bruits. Chest: symmetric with normal excursions and percussion. Breasts: Symmetric, without lumps, nipple discharge, retractions, or fibrocystic changes.  Abdomen: Flat, soft with bowel sounds active. Nontender, no guarding, rebound, hernias, masses, or organomegaly.  Lymphatics: Non tender without lymphadenopathy.  Genitourinary:  Musculoskeletal: Full ROM all peripheral extremities, joint stability, 5/5 strength, and normal gait. Skin: Warm and dry without rashes, lesions, cyanosis,  clubbing or  ecchymosis.  Neuro: Cranial nerves intact, reflexes equal bilaterally. Normal muscle tone, no cerebellar symptoms. Sensation intact.  Pysch: Alert and oriented X 3, normal affect, Insight and Judgment appropriate.   Assessment and Plan  1. Annual Preventative Screening Examination  2. Essential hypertension  - EKG 12-Lead - Urinalysis, Routine w reflex microscopic - Microalbumin / creatinine urine ratio - CBC with Differential/Platelet - COMPLETE METABOLIC PANEL WITH GFR - Magnesium - TSH  3. Hyperlipidemia, mixed  - EKG 12-Lead - Lipid panel - TSH  4. Abnormal glucose  - EKG 12-Lead - Hemoglobin A1c - Insulin, random  5. Vitamin D deficiency  - VITAMIN D 25 Hydroxyl  6. Prediabetes  - EKG 12-Lead - Hemoglobin A1c - Insulin, random  7. Gastroesophageal reflux disease  - CBC with Differential/Platelet  8. Screening for ischemic heart disease  - EKG 12-Lead  9. Aortic atherosclerosis (HCC)  - EKG 12-Lead  10. FHx: heart disease  - EKG 12-Lead  11. Medication management  - Urinalysis, Routine w reflex microscopic - Microalbumin / creatinine urine ratio - CBC with Differential/Platelet - COMPLETE METABOLIC PANEL WITH GFR - Magnesium - Lipid panel - TSH - Hemoglobin A1c - Insulin, random - VITAMIN D 25 Hydroxy  12. Screening for colorectal cancer - POC Hemoccult Bld/Stl        Patient was counseled in prudent diet to achieve/maintain BMI less than 25 for weight control, BP monitoring, regular exercise and medications. Discussed med's effects and SE's. Screening labs and tests as requested with regular follow-up as recommended. Over 40 minutes of exam, counseling, chart review and high complex critical decision making was performed.  Kirtland Bouchard, MD

## 2018-12-08 NOTE — Patient Instructions (Signed)

## 2018-12-09 ENCOUNTER — Other Ambulatory Visit: Payer: Self-pay

## 2018-12-09 ENCOUNTER — Ambulatory Visit (INDEPENDENT_AMBULATORY_CARE_PROVIDER_SITE_OTHER): Payer: PPO | Admitting: Internal Medicine

## 2018-12-09 VITALS — BP 124/72 | HR 64 | Temp 97.4°F | Resp 16 | Ht 63.5 in | Wt 112.8 lb

## 2018-12-09 DIAGNOSIS — I4891 Unspecified atrial fibrillation: Secondary | ICD-10-CM | POA: Diagnosis not present

## 2018-12-09 DIAGNOSIS — R7309 Other abnormal glucose: Secondary | ICD-10-CM

## 2018-12-09 DIAGNOSIS — Z136 Encounter for screening for cardiovascular disorders: Secondary | ICD-10-CM

## 2018-12-09 DIAGNOSIS — Z0000 Encounter for general adult medical examination without abnormal findings: Secondary | ICD-10-CM | POA: Diagnosis not present

## 2018-12-09 DIAGNOSIS — I10 Essential (primary) hypertension: Secondary | ICD-10-CM

## 2018-12-09 DIAGNOSIS — N280 Ischemia and infarction of kidney: Secondary | ICD-10-CM

## 2018-12-09 DIAGNOSIS — R7303 Prediabetes: Secondary | ICD-10-CM | POA: Diagnosis not present

## 2018-12-09 DIAGNOSIS — E782 Mixed hyperlipidemia: Secondary | ICD-10-CM

## 2018-12-09 DIAGNOSIS — Z8249 Family history of ischemic heart disease and other diseases of the circulatory system: Secondary | ICD-10-CM

## 2018-12-09 DIAGNOSIS — Z1211 Encounter for screening for malignant neoplasm of colon: Secondary | ICD-10-CM

## 2018-12-09 DIAGNOSIS — I700 Atherosclerosis of aorta: Secondary | ICD-10-CM

## 2018-12-09 DIAGNOSIS — Z0001 Encounter for general adult medical examination with abnormal findings: Secondary | ICD-10-CM

## 2018-12-09 DIAGNOSIS — D735 Infarction of spleen: Secondary | ICD-10-CM

## 2018-12-09 DIAGNOSIS — Z79899 Other long term (current) drug therapy: Secondary | ICD-10-CM

## 2018-12-09 DIAGNOSIS — K219 Gastro-esophageal reflux disease without esophagitis: Secondary | ICD-10-CM

## 2018-12-09 DIAGNOSIS — I2510 Atherosclerotic heart disease of native coronary artery without angina pectoris: Secondary | ICD-10-CM

## 2018-12-09 DIAGNOSIS — I2583 Coronary atherosclerosis due to lipid rich plaque: Secondary | ICD-10-CM

## 2018-12-09 DIAGNOSIS — E559 Vitamin D deficiency, unspecified: Secondary | ICD-10-CM

## 2018-12-09 MED ORDER — DIGOXIN 125 MCG PO TABS: ORAL_TABLET | ORAL | 3 refills | Status: DC

## 2018-12-10 LAB — TSH: TSH: 0.3 mIU/L — ABNORMAL LOW (ref 0.40–4.50)

## 2018-12-10 LAB — COMPLETE METABOLIC PANEL WITH GFR
AG Ratio: 2.1 (calc) (ref 1.0–2.5)
ALT: 44 U/L — ABNORMAL HIGH (ref 6–29)
AST: 41 U/L — ABNORMAL HIGH (ref 10–35)
Albumin: 4.6 g/dL (ref 3.6–5.1)
Alkaline phosphatase (APISO): 61 U/L (ref 37–153)
BUN: 11 mg/dL (ref 7–25)
CO2: 27 mmol/L (ref 20–32)
Calcium: 9.7 mg/dL (ref 8.6–10.4)
Chloride: 95 mmol/L — ABNORMAL LOW (ref 98–110)
Creat: 0.74 mg/dL (ref 0.60–0.88)
GFR, Est African American: 87 mL/min/{1.73_m2} (ref >=60)
GFR, Est Non African American: 75 mL/min/{1.73_m2} (ref >=60)
Globulin: 2.2 g/dL (calc) (ref 1.9–3.7)
Glucose, Bld: 140 mg/dL — ABNORMAL HIGH (ref 65–99)
Potassium: 4.6 mmol/L (ref 3.5–5.3)
Sodium: 134 mmol/L — ABNORMAL LOW (ref 135–146)
Total Bilirubin: 0.5 mg/dL (ref 0.2–1.2)
Total Protein: 6.8 g/dL (ref 6.1–8.1)

## 2018-12-10 LAB — VITAMIN D 25 HYDROXY (VIT D DEFICIENCY, FRACTURES): Vit D, 25-Hydroxy: 63 ng/mL (ref 30–100)

## 2018-12-10 LAB — URINALYSIS, ROUTINE W REFLEX MICROSCOPIC
Bilirubin Urine: NEGATIVE
Hgb urine dipstick: NEGATIVE
Ketones, ur: NEGATIVE
Leukocytes,Ua: NEGATIVE
Nitrite: NEGATIVE
Protein, ur: NEGATIVE
Specific Gravity, Urine: 1.006 (ref 1.001–1.03)
pH: 6.5 (ref 5.0–8.0)

## 2018-12-10 LAB — CBC WITH DIFFERENTIAL/PLATELET
Absolute Monocytes: 543 cells/uL (ref 200–950)
Basophils Absolute: 12 cells/uL (ref 0–200)
Basophils Relative: 0.1 %
Eosinophils Absolute: 0 cells/uL — ABNORMAL LOW (ref 15–500)
Eosinophils Relative: 0 %
HCT: 34.7 % — ABNORMAL LOW (ref 35.0–45.0)
Hemoglobin: 11.7 g/dL (ref 11.7–15.5)
Lymphs Abs: 1204 cells/uL (ref 850–3900)
MCH: 31.7 pg (ref 27.0–33.0)
MCHC: 33.7 g/dL (ref 32.0–36.0)
MCV: 94 fL (ref 80.0–100.0)
MPV: 10.4 fL (ref 7.5–12.5)
Monocytes Relative: 4.6 %
Neutro Abs: 10042 cells/uL — ABNORMAL HIGH (ref 1500–7800)
Neutrophils Relative %: 85.1 %
Platelets: 395 10*3/uL (ref 140–400)
RBC: 3.69 10*6/uL — ABNORMAL LOW (ref 3.80–5.10)
RDW: 13.1 % (ref 11.0–15.0)
Total Lymphocyte: 10.2 %
WBC: 11.8 10*3/uL — ABNORMAL HIGH (ref 3.8–10.8)

## 2018-12-10 LAB — LIPID PANEL
Cholesterol: 180 mg/dL (ref <200)
HDL: 92 mg/dL (ref >=50)
LDL Cholesterol (Calc): 68 mg/dL (calc)
Non-HDL Cholesterol (Calc): 88 mg/dL (calc) (ref <130)
Total CHOL/HDL Ratio: 2 (calc) (ref <5.0)
Triglycerides: 113 mg/dL (ref <150)

## 2018-12-10 LAB — MICROALBUMIN / CREATININE URINE RATIO
Creatinine, Urine: 17 mg/dL — ABNORMAL LOW (ref 20–275)
Microalb Creat Ratio: 65 mcg/mg creat — ABNORMAL HIGH (ref <30)
Microalb, Ur: 1.1 mg/dL

## 2018-12-10 LAB — HEMOGLOBIN A1C
Hgb A1c MFr Bld: 5.9 % of total Hgb — ABNORMAL HIGH (ref <5.7)
Mean Plasma Glucose: 123 (calc)
eAG (mmol/L): 6.8 (calc)

## 2018-12-10 LAB — MAGNESIUM: Magnesium: 1.8 mg/dL (ref 1.5–2.5)

## 2018-12-10 LAB — INSULIN, RANDOM: Insulin: 5.8 u[IU]/mL

## 2018-12-15 ENCOUNTER — Other Ambulatory Visit: Payer: Self-pay | Admitting: Physician Assistant

## 2018-12-15 DIAGNOSIS — E782 Mixed hyperlipidemia: Secondary | ICD-10-CM

## 2018-12-15 DIAGNOSIS — N280 Ischemia and infarction of kidney: Secondary | ICD-10-CM

## 2018-12-15 DIAGNOSIS — I700 Atherosclerosis of aorta: Secondary | ICD-10-CM

## 2018-12-17 NOTE — Progress Notes (Signed)
[facility]   Unk Pinto, M.D.    Uvaldo Bristle. Silverio Lay, P.A.-C      Liane Comber, [place]                [address] [place] [address] Telephone [phone] Telefax [phone] _________________________________ Subjective:    Patient ID: Debra White, female    DOB: 05/11/1934, 85 y.o.   MRN: [id number]  HPI     This very nice 85 y.o. MWF  with  HTN, HLD, Prediabetes  and Vitamin D Deficiency has pertinent hx/o  Hospitalization  Sept 2019 with renal & splenic infarcts and was started on Eliquis.  Telemetry monitoring in hospital and 30 day telemetry monitor post hospital did not reveal atrial arrhythmias in Oct.  Recent EKG on 12/09/2018 did show NEW Afib with controlled VR. (CHADsVASc - 6). Patient was on Atenolol and was started on Digoxin 0.125 mg. She denies any palpitations, dyspnea/orthopnea/PND or edema. Labs revealed TSH suppressed at 0.30 (Nl 0.40-4.50)   Medication Sig  . acetaminophen (TYLENOL) 500 MG tablet Take 500-1,000 mg  every 6  hours as needed for headache.  Marland Kitchen atenolol (TENORMIN) 100 MG tablet TAKE 1 TABLET DAILY FOR BLOOD PRESSURE  . VITAMIN D 2000 units tablet Take 4,000 Units by mouth daily.   . cimetidine  200 MG tablet Take 1 tablet (200 mg total) by mouth 2 (two) times daily.  . clonazePAM  1 MG tablet TAKE 1/2 TO 1 TAB  2-3 TIMES DAILY ONLY IF NEEDED   . diclofenac sodium (VOLTAREN) 1 % GEL Apply 2 g topically 4 (four) times daily.  Marland Kitchen dicyclomine (BENTYL) 10 MG capsule Take 1 capsule 3 times daily before meals.  . digoxin (LANOXIN) 0.125 MG tablet Take 1 tablet Daily for fast / Irregular Heart Beat  . ELIQUIS 5 MG TABS tablet TAKE 1 TABLET BY MOUTH TWICE DAILY  . Gabapentin 600 MG tablet Take 1/2 to 1 tablet s 3 to 4 x / day as needed for Facial Pain   . Hyoscyamine SL 0.125 MG SL tablet DISSOLVE 1 TABLET SL EVERY 4 HRS AS NEEDED   . lovastatin  (MEVACOR) 20 MG tablet TAKE 1 TABLET BY MOUTH EACH NIGHT AT BEDTIME  . olmesartan (BENICAR) 40 MG tablet Take 1/2 to 1 tablet daily for BP  . omeprazole (PRILOSEC) 40 MG capsule Take 1 capsule (40 mg total) by mouth daily before breakfast.  . ondansetron (ZOFRAN) 8 MG tablet Take 1 tablet 3 x  /day if needed for Nausea  . promethazine  25 MG tablet Take 1 or 2 tablets 4 x /day if needed for Nausea   Allergies  Allergen Reactions  . Latex Hives    TONGUE HAD BLISTERS WHEN HAD DENTAL SURGERY  . Amoxicillin Nausea And Vomiting  . Aspirin Other (See Comments)    Gi upset  . Barbiturates Other (See Comments)    Unknown reaction  . Celebrex [Celecoxib] Other (See Comments)    Unknown reaction  . Codeine Itching  . Evista [Raloxifene] Other (See Comments)    Unknown reaction  . Lipitor [Atorvastatin] Other (See Comments)    Unknown reaction  . Morphine And Related Other (See Comments)    Unknown reaction  . Oruvail [Ketoprofen] Other (See Comments)    Unknown reaction  . Pantoprazole Diarrhea  . Paraffin Other (See Comments)  Unknown reaction  . Prochlorperazine Other (See Comments)    Uncontrolled shaking  . Proloprim [Trimethoprim] Other (See Comments)    Unknown reaction  . Venlafaxine Nausea Only  . Vibra-Tab [Doxycycline] Other (See Comments)    Unknown reaction  . Vioxx [Rofecoxib] Other (See Comments)    edema  . Betadine [Povidone Iodine] Rash  . Caffeine Palpitations   Past Medical History:  Diagnosis Date  . Anxiety   . Arthritis   . Blood transfusion   . C. difficile colitis   . Candida esophagitis ([place]) 2013   EGD  . Cataract   . Chronic kidney disease    STONES  . Diverticulitis 04/05/2017  . Diverticulosis of colon (without mention of hemorrhage) 2007   Colonoscopy  . Dysrhythmia    RX  . Emphysema of lung ([place])   . Family history of colon cancer    sister  . Fibromyalgia   . Fracture, zygoma closed ([place]) 07/13/2011  . GERD (gastroesophageal  reflux disease)   . Headache(784.0)   . Hyperlipemia   . Hypertension   . Kidney stones   . Ptosis, bilateral   . Renal infarct ([place])   . Splenic infarct    Past Surgical History:  Procedure Laterality Date  . ABDOMINAL HYSTERECTOMY    . BACK SURGERY     X2  . BACK SURGERY    . BREAST EXCISIONAL BIOPSY    . CERVICAL DISC SURGERY    . COLONOSCOPY    . FACIAL FRACTURE SURGERY    . HAND SURGERY     BIL   . ORIF TRIPOD FRACTURE  07/13/2011   Procedure: OPEN REDUCTION INTERNAL FIXATION (ORIF) TRIPOD FRACTURE;  Surgeon: Tyson Alias, MD;  Location: [facility];  Service: ENT;  Laterality: Right;  ORIF RIGHT ZYGOMA, ORBITAL FLOOR EXPLORATION WITH FROST STITCH (TEMPORARY TARSORRHAPHY)  . PTOSIS REPAIR Bilateral 11/22/2016   Procedure: INTERNAL PTOSIS REPAIR;  Surgeon: Clista Bernhardt, MD;  Location: [facility];  Service: Ophthalmology;  Laterality: Bilateral;  . SHOULDER ARTHROSCOPY W/ ROTATOR CUFF REPAIR     LFT   Review of Systems   10 point systems review negative except as above.    Objective:   Physical Exam  BP (!) 142/80   Pulse 72   Temp 97.8 F (36.6 C)   Resp 16   Ht 5' 3.5" (1.613 m)   Wt 110 lb 12.8 oz (50.3 kg)   BMI 19.32 kg/m   HEENT - WNL. Neck - supple.  Chest - Clear equal BS. Cor - Nl HS. RRR w/o sig MGR. PP 1(+). No edema. MS- FROM w/o deformities.  Gait Nl. Neuro -  Nl w/o focal abnormalities.     Assessment & Plan:   1. Essential hypertension  - TSH  2. Atrial fibrillation with controlled ventricular response (HCC)  - TSH - Digoxin level  3. Hyperthyroidism  - TSH - T3, free - T4, Free  - NM Thyroid Scan/Uptake 24 Hr; Future  4. Aortic atherosclerosis ([place])  5. Screening for colorectal cancer  - POC Hemoccult Bld/Stl

## 2018-12-17 NOTE — Patient Instructions (Signed)

## 2018-12-18 ENCOUNTER — Ambulatory Visit (INDEPENDENT_AMBULATORY_CARE_PROVIDER_SITE_OTHER): Payer: PPO | Admitting: Internal Medicine

## 2018-12-18 ENCOUNTER — Other Ambulatory Visit: Payer: Self-pay

## 2018-12-18 VITALS — BP 142/80 | HR 72 | Temp 97.8°F | Resp 16 | Ht 63.5 in | Wt 110.8 lb

## 2018-12-18 DIAGNOSIS — Z1211 Encounter for screening for malignant neoplasm of colon: Secondary | ICD-10-CM

## 2018-12-18 DIAGNOSIS — E0590 Thyrotoxicosis, unspecified without thyrotoxic crisis or storm: Secondary | ICD-10-CM | POA: Diagnosis not present

## 2018-12-18 DIAGNOSIS — Z136 Encounter for screening for cardiovascular disorders: Secondary | ICD-10-CM | POA: Diagnosis not present

## 2018-12-18 DIAGNOSIS — I10 Essential (primary) hypertension: Secondary | ICD-10-CM | POA: Diagnosis not present

## 2018-12-18 DIAGNOSIS — I4891 Unspecified atrial fibrillation: Secondary | ICD-10-CM

## 2018-12-18 DIAGNOSIS — I700 Atherosclerosis of aorta: Secondary | ICD-10-CM

## 2018-12-18 LAB — POC HEMOCCULT BLD/STL (HOME/3-CARD/SCREEN)
Card #2 Fecal Occult Blod, POC: NEGATIVE
Card #3 Fecal Occult Blood, POC: NEGATIVE
Fecal Occult Blood, POC: NEGATIVE

## 2018-12-19 ENCOUNTER — Other Ambulatory Visit: Payer: Self-pay | Admitting: Internal Medicine

## 2018-12-19 LAB — T4, FREE: Free T4: 1.6 ng/dL (ref 0.8–1.8)

## 2018-12-19 LAB — DIGOXIN LEVEL: Digoxin Level: 2 mcg/L (ref 0.8–2.0)

## 2018-12-19 LAB — T3, FREE: T3, Free: 3.2 pg/mL (ref 2.3–4.2)

## 2018-12-19 LAB — TSH: TSH: 0.68 mIU/L (ref 0.40–4.50)

## 2018-12-20 ENCOUNTER — Encounter: Payer: Self-pay | Admitting: Internal Medicine

## 2019-01-05 ENCOUNTER — Telehealth: Payer: Self-pay | Admitting: *Deleted

## 2019-01-05 NOTE — Telephone Encounter (Signed)
Daughter, Arbie Cookey, called and asked advise regarding medication management for the patient.  Dr [name] is aware and will request a home health evaluation.

## 2019-01-06 ENCOUNTER — Telehealth: Payer: Self-pay | Admitting: Internal Medicine

## 2019-01-06 NOTE — Telephone Encounter (Signed)
[facility] called to advise they are currently  unable to service patient timely, 2wks or more.   Per Dr [name], send to another [facility] agency.

## 2019-01-20 NOTE — Progress Notes (Signed)
Subjective:    Patient ID: Debra White, female    DOB: 04-Mar-1934, 85 y.o.   MRN: [id number]  HPI   This very nice 85 yo MWF w/ HTN, HLD, PreDM on Eliquis for hx/o renal & splenic Infarcts  (2019) presented on 12/09/2018 w/ new Afib with controlled VR- already on Atenolol - was started on Digoxin. On recheck on 6/25 - Dig level was 2.0 (range 0.8-2.0). TSH was suppressed at time of initial Afib, but rechecked Normal, planned 24 hr RAI* uptake & scan was cancelled. Patient presents today for f/u and denies any c/o HA's , postural dizziness, CP, palpitations, dyspnea , but does c/o dependent edema of her feet & ankles & requests a fluid pill. EKG today shows Afib with controlled VR persists. She did have negative 2D Echocardiogram in the hospital and telemetry showed no arrhythmia. Last fall Cardiology had recommended TEE and patient adamantly refuses any further cardiac testing. She is advised that in view of the newly found Afib, that she will need to stay on Eliquis lifelong.  .  Medication Sig  . acetaminophen (TYLENOL) 500 MG tablet Take 500-1,000 mg by mouth every 6 (six) hours as needed for headache.  Marland Kitchen atenolol (TENORMIN) 100 MG tablet TAKE 1 TABLET BY MOUTH DAILY FOR BLOOD PRESSURE  . Cholecalciferol (VITAMIN D) 2000 units tablet Take 4,000 Units by mouth daily.   . clonazePAM (KLONOPIN) 1 MG tablet TAKE 1/2 TO 1 TABLET BY MOUTH 2-3 TIMES DAILY ONLY IF NEEDED FOR ANXIETY ATTACK.PLEASE TRY TO LIMIT TO 5 DAYS PER WEEK TO AVOID ADDICTION  . diclofenac sodium (VOLTAREN) 1 % GEL Apply 2 g topically 4 (four) times daily.  Marland Kitchen dicyclomine (BENTYL) 10 MG capsule Take 1 capsule (10 mg total) by mouth 3 (three) times daily before meals.  . digoxin (LANOXIN) 0.125 MG tablet Take 1 tablet Daily for fast / Irregular Heart Beat  . ELIQUIS 5 MG TABS tablet TAKE 1 TABLET BY MOUTH TWICE DAILY  . gabapentin (NEURONTIN) 600 MG tablet Take 1/2 to 1 tablet s 3 to 4 x / day as needed for Facial Pain (Patient  taking differently: Take 300-600 mg by mouth See admin instructions. Take 300 mg in the morning, 300 mg at 1400 and 600 mg at bedtime for facial pain)  . hyoscyamine (LEVSIN SL) 0.125 MG SL tablet DISSOLVE 1 TABLET UNDER THE TONGUE EVERY4 HOURS AS NEEDED FOR NAUSEA,CRAMPING, BLOATING OR DIARRHEA  . lovastatin (MEVACOR) 20 MG tablet TAKE 1 TABLET BY MOUTH EACH NIGHT AT BEDTIME  . olmesartan (BENICAR) 40 MG tablet Take 1/2 to 1 tablet daily for BP  . omeprazole (PRILOSEC) 40 MG capsule Take 1 capsule (40 mg total) by mouth daily before breakfast.  . ondansetron (ZOFRAN) 8 MG tablet Take 1 tablet 3 x  /day if needed for Nausea    Allergies  Allergen Reactions  . Latex Hives    TONGUE HAD BLISTERS WHEN HAD DENTAL SURGERY  . Amoxicillin Nausea And Vomiting  . Aspirin Other (See Comments)    Gi upset  . Barbiturates Other (See Comments)    Unknown reaction  . Celebrex [Celecoxib] Other (See Comments)    Unknown reaction  . Codeine Itching  . Evista [Raloxifene] Other (See Comments)    Unknown reaction  . Lipitor [Atorvastatin] Other (See Comments)    Unknown reaction  . Morphine And Related Other (See Comments)    Unknown reaction  . Oruvail [Ketoprofen] Other (See Comments)    Unknown reaction  .  Pantoprazole Diarrhea  . Paraffin Other (See Comments)    Unknown reaction  . Prochlorperazine Other (See Comments)    Uncontrolled shaking  . Proloprim [Trimethoprim] Other (See Comments)    Unknown reaction  . Venlafaxine Nausea Only  . Vibra-Tab [Doxycycline] Other (See Comments)    Unknown reaction  . Vioxx [Rofecoxib] Other (See Comments)    edema  . Betadine [Povidone Iodine] Rash  . Caffeine Palpitations   Past Medical History:  Diagnosis Date  . Anxiety   . Arthritis   . Blood transfusion   . C. difficile colitis   . Candida esophagitis ([place]) 2013   EGD  . Cataract   . Chronic kidney disease    STONES  . Diverticulitis 04/05/2017  . Diverticulosis of colon (without  mention of hemorrhage) 2007   Colonoscopy  . Dysrhythmia    RX  . Emphysema of lung ([place])   . Family history of colon cancer    sister  . Fibromyalgia   . Fracture, zygoma closed ([place]) 07/13/2011  . GERD (gastroesophageal reflux disease)   . Headache(784.0)   . Hyperlipemia   . Hypertension   . Kidney stones   . Ptosis, bilateral   . Renal infarct ([place])   . Splenic infarct    Past Surgical History:  Procedure Laterality Date  . ABDOMINAL HYSTERECTOMY    . BACK SURGERY     X2  . BACK SURGERY    . BREAST EXCISIONAL BIOPSY    . CERVICAL DISC SURGERY    . COLONOSCOPY    . FACIAL FRACTURE SURGERY    . HAND SURGERY     BIL   . ORIF TRIPOD FRACTURE  07/13/2011   Procedure: OPEN REDUCTION INTERNAL FIXATION (ORIF) TRIPOD FRACTURE;  Surgeon: Tyson Alias, MD;  Location: [facility];  Service: ENT;  Laterality: Right;  ORIF RIGHT ZYGOMA, ORBITAL FLOOR EXPLORATION WITH FROST STITCH (TEMPORARY TARSORRHAPHY)  . PTOSIS REPAIR Bilateral 11/22/2016   Procedure: INTERNAL PTOSIS REPAIR;  Surgeon: Clista Bernhardt, MD;  Location: [facility];  Service: Ophthalmology;  Laterality: Bilateral;  . SHOULDER ARTHROSCOPY W/ ROTATOR CUFF REPAIR     LFT   Review of Systems  10 point systems review negative except as above.    Objective:   Physical Exam  BP (!) 162/84   Pulse 68   Temp (!) 97.4 F (36.3 C)   Resp 16   Ht 5' 3.5" (1.613 m)   Wt 114 lb (51.7 kg)   BMI 19.88 kg/m   HEENT - WNL. Neck - supple.  Chest - Clear equal BS. Cor - Nl HS. RRR w/o sig MGR. PP 1(+). 1-2 + ankle & pedal edema. MS- FROM w/o deformities.  Gait Nl. Neuro -  Nl w/o focal abnormalities.  EKG- Afib with controlled VR    Assessment & Plan:   1. Essential hypertension   2. Atrial fibrillation with controlled ventricular response (HCC)  - EKG 12-Lead  - Digoxin level  3. Medication management  - Digoxin level  - discussed meds & SE's Between 15-20 minutes of counseling, chart review, and critical  decision making was performed

## 2019-01-21 ENCOUNTER — Encounter: Payer: Self-pay | Admitting: Internal Medicine

## 2019-01-21 ENCOUNTER — Ambulatory Visit (INDEPENDENT_AMBULATORY_CARE_PROVIDER_SITE_OTHER): Payer: PPO | Admitting: Internal Medicine

## 2019-01-21 ENCOUNTER — Other Ambulatory Visit: Payer: Self-pay

## 2019-01-21 VITALS — BP 136/86 | HR 68 | Temp 97.4°F | Resp 16 | Ht 63.5 in | Wt 114.0 lb

## 2019-01-21 DIAGNOSIS — Z136 Encounter for screening for cardiovascular disorders: Secondary | ICD-10-CM

## 2019-01-21 DIAGNOSIS — Z79899 Other long term (current) drug therapy: Secondary | ICD-10-CM | POA: Diagnosis not present

## 2019-01-21 DIAGNOSIS — I10 Essential (primary) hypertension: Secondary | ICD-10-CM

## 2019-01-21 DIAGNOSIS — I4891 Unspecified atrial fibrillation: Secondary | ICD-10-CM | POA: Diagnosis not present

## 2019-01-21 MED ORDER — FUROSEMIDE 40 MG PO TABS: ORAL_TABLET | ORAL | 0 refills | Status: DC

## 2019-01-21 NOTE — Patient Instructions (Addendum)
Bleeding Precautions When on Anticoagulant Therapy  Anticoagulant therapy, also called blood thinner therapy, is medicine that helps to prevent and treat blood clots. The medicine works by stopping blood clots from forming or growing. Blood clots that form in your blood vessels can be dangerous. They can break loose and travel to the heart, lungs, or brain. This increases the risk of a heart attack, stroke, or blocked lung artery (pulmonary embolism). Anticoagulants also increase the risk of bleeding. Try to protect yourself from cuts and other injuries that can cause bleeding. It is important to take anticoagulants exactly as told by your health care provider.  Why do I need to be on anticoagulant therapy?  You may need this medicine if you are at risk of developing a blood clot. Conditions that increase your risk of a blood clot include:   Having atrial fibrillation (AF). What are the common anticoagulant medicines?  There are several types of anticoagulant medicines. The most common types are:  Medicines that you take by mouth (oral medicines), such as:    ? Novel oral anticoagulants (NOACs), such as: ? Eliquis  These anticoagulants work  to prevent blood clots. They  have different risks and side effects.  What do I need to remember while on anticoagulant therapy? Taking anticoagulants   Take your medicine at the same time every day. If you forget to take your medicine, take it as soon as you remember. Do not double your dosage of medicine if you miss a whole day. Take your normal dose and call your health care provider.    Do not stop taking your medicine unless your health care provider approves. Stopping the medicine can increase your risk of developing a blood clot.   Taking other medicines   Take over-the-counter and prescriptions medicines only as told by your health care provider.  Do not take over-the-counter NSAIDs, including aspirin and ibuprofen, while you are  on anticoagulant therapy. These medicines increase your risk of dangerous bleeding.  Get approval from your health care provider before you start taking any new medicines, vitamins, or herbal products. Some of these could interfere with your therapy.   General instructions  Keep all follow-up visits as told by your health care provider. This is important.  If you are pregnant or trying to get pregnant, talk with a health care provider about anticoagulants. Some of these medicines are not safe to take during pregnancy.  Tell all health care providers, including your dentist, that you are on anticoagulant therapy. It is especially important to tell providers before you have any surgery, medical procedures, or dental work done.   What precautions should I take?   Be very careful when using knives, scissors, or other sharp objects.  Use an electric razor instead of a blade.  Do not use toothpicks.  Use a soft-bristled toothbrush. Brush your teeth gently.  Always wear shoes outdoors and wear slippers indoors.  Be careful when cutting your fingernails and toenails.  Place bath mats in the bathroom. If possible, install handrails as well.  Wear gloves while you do yard work.  Wear your seat belt.  Prevent falls by removing loose rugs and extension cords from areas where you walk. Use a cane or walker if you need it.  Avoid constipation by: ? Drinking enough fluid to keep your urine clear or pale yellow. ? Eating foods that are high in fiber, such as fresh fruits and vegetables, whole grains, and beans. Limiting foods that are high in  fat and processed sugars, such as fried and sweet foods.  What are some questions to ask my health care provider?  Why do I need anticoagulant therapy?  What is the best anticoagulant therapy for my condition?  How long will I need anticoagulant therapy?  What are the side effects of anticoagulant therapy?  When should I take my medicine?  What should I do if I forget to take it?  Will I need to have regular blood tests?  Do I need to change my diet? Are there foods or drinks that I should avoid?  What activities are safe for me?   Contact a health care provider if:  You miss a dose of medicine: ? And you are not sure what to do. ? For more than one day.  You have: ? Menstrual bleeding  ? Bloody or brown urine. ? Easy bruising. ? Black and tarry stool or bright red stool. ? Side effects from your medicine.  You feel weak or dizzy.  Get help right away if:  You have bleeding that will not stop within 20 minutes from: ? The nose. ? The gums. ? A cut on the skin.  You have a severe headache or stomachache.  You vomit or cough up blood. You fall or hit your head.  Summary   Anticoagulant therapy, also called blood thinner therapy, is medicine that helps to prevent and treat blood clots.  Anticoagulants work in different ways to prevent blood clots. They also have different risks and side effects.

## 2019-01-22 LAB — DIGOXIN LEVEL: Digoxin Level: 0.9 mcg/L (ref 0.8–2.0)

## 2019-02-06 ENCOUNTER — Other Ambulatory Visit: Payer: Self-pay | Admitting: Physician Assistant

## 2019-02-23 ENCOUNTER — Other Ambulatory Visit: Payer: Self-pay | Admitting: Physician Assistant

## 2019-02-23 DIAGNOSIS — K2100 Gastro-esophageal reflux disease with esophagitis, without bleeding: Secondary | ICD-10-CM

## 2019-03-05 ENCOUNTER — Ambulatory Visit: Payer: PPO | Admitting: Cardiovascular Disease

## 2019-03-07 ENCOUNTER — Other Ambulatory Visit: Payer: Self-pay | Admitting: Adult Health

## 2019-03-11 NOTE — Progress Notes (Signed)
Assessment and Plan:  Hypertension - soft BP today, no symptoms but suggest cutting atenolol in half, monitor blood pressure at home. Continue DASH diet.  Reminder to go to the ER if any CP, SOB, nausea, dizziness, severe HA, changes vision/speech, left arm numbness and tingling, and jaw pain.  Cholesterol - Continue diet and exercise. Check cholesterol.   Abnormal glucose -Continue diet and exercise. Check A1C  Vitamin D Def - check level and continue medications.   Malnutrition - continue boost  Tortuous aorta (HCC) Control blood pressure, cholesterol, glucose, increase exercise.  -     Lipid panel  Pulmonary emphysema, unspecified emphysema type (HCC) At baseline, control triggers  Aortic atherosclerosis (HCC) Control blood pressure, cholesterol, glucose, increase exercise.   Atrial fibrillation with controlled ventricular response (HCC) Continue medication  Needs flu shot -     Flu vaccine HIGH DOSE PF  Continue diet and meds as discussed. Further disposition pending results of labs. Future Appointments  Date Time Provider [place]  05/08/2019 10:30 AM Vicie Mutters, PA-C [facility] None  12/15/2019  9:00 AM Unk Pinto, MD [facility] None    HPI 85 y.o. female  presents for 6 month follow up with hypertension, hyperlipidemia, prediabetes and vitamin D.  She is following with Dr. Berenice Primas for her OA.    She complains she aches all over, has had negative CPK, aldolase, lyme, sed rate/CRP in April. Magnesium was low last visit 1.8.   Lab Results  Component Value Date   TSH 0.68 12/18/2018    She was started on eliquis for Afib (11/2018) and renal/splenic infarcts in 2019. She states she was changed from elliquis 5mg  1/2 pill twice a day.   She has facial pain/neuraliga, continues to complains about right facial pain, HA in the morning, and right eye pain, no change in vision, no change in speech, no weakness, has seen Dr. Delice Lesch in the past but does  not want to see again, and is on lidocaine ointment, klonopin BID, tramadol bID and gabapentin 600mg  4 a day.   BMI is Body mass index is 19.18 kg/m. Wt Readings from Last 3 Encounters:  03/12/19 110 lb (49.9 kg)  01/21/19 114 lb (51.7 kg)  12/18/18 110 lb 12.8 oz (50.3 kg)   Her blood pressure has been controlled at home, today their BP is BP: 118/80 She does not workout. She denies chest pain, shortness of breath, dizziness.  She is on cholesterol medication and denies myalgias. Her cholesterol is at goal. The cholesterol last visit was:   Lab Results  Component Value Date   CHOL 180 12/09/2018   HDL 92 12/09/2018   LDLCALC 68 12/09/2018   TRIG 113 12/09/2018   CHOLHDL 2.0 12/09/2018   She has been working on diet and exercise for prediabetes, and denies polydipsia, polyuria and visual disturbances. Last A1C in the office was:  Lab Results  Component Value Date   HGBA1C 5.9 (H) 12/09/2018   Patient is on Vitamin D supplement.   Lab Results  Component Value Date   VD25OH 63 12/09/2018       Current Medications:  Current Outpatient Medications on File Prior to Visit  Medication Sig Dispense Refill  . acetaminophen (TYLENOL) 500 MG tablet Take 500-1,000 mg by mouth every 6 (six) hours as needed for headache.    Marland Kitchen atenolol (TENORMIN) 100 MG tablet TAKE 1 TABLET BY MOUTH DAILY FOR BLOOD PRESSURE 90 tablet 3  . Cholecalciferol (VITAMIN D) 2000 units tablet Take 4,000 Units by  mouth daily.     . clonazePAM (KLONOPIN) 1 MG tablet TAKE 1/2 TO 1 TABLET BY MOUTH 2-3 TIMES DAILY ONLY IF NEEDED FOR ANXIETY ATTACK....PLEASE LIMIT TO 5 DAYS PER WEEK TO AVOID ADDICTION 90 tablet 0  . diclofenac sodium (VOLTAREN) 1 % GEL Apply 2 g topically 4 (four) times daily. 500 g 2  . dicyclomine (BENTYL) 10 MG capsule Take 1 capsule (10 mg total) by mouth 3 (three) times daily before meals. 90 capsule 11  . digoxin (LANOXIN) 0.125 MG tablet Take 1 tablet Daily for fast / Irregular Heart Beat 100 tablet  3  . ELIQUIS 5 MG TABS tablet TAKE 1 TABLET BY MOUTH TWICE DAILY 60 tablet 2  . furosemide (LASIX) 40 MG tablet Take 1/2 to 1 tablet Daily for Fluid Retention & Ankle Swelling 90 tablet 0  . hyoscyamine (LEVSIN SL) 0.125 MG SL tablet DISSOLVE 1 TABLET UNDER THE TONGUE EVERY4 HOURS AS NEEDED FOR NAUSEA,CRAMPING, BLOATING OR DIARRHEA 120 tablet 0  . lovastatin (MEVACOR) 20 MG tablet TAKE 1 TABLET BY MOUTH EACH NIGHT AT BEDTIME 30 tablet 2  . olmesartan (BENICAR) 40 MG tablet Take 1/2 to 1 tablet daily for BP 90 tablet 3  . omeprazole (PRILOSEC) 40 MG capsule TAKE 1 CAPSULE BY MOUTH DAILY BEFORE BREAKFAST 90 capsule 1  . ondansetron (ZOFRAN) 8 MG tablet Take 1 tablet 3 x  /day if needed for Nausea 90 tablet 0  . gabapentin (NEURONTIN) 600 MG tablet Take 1/2 to 1 tablet s 3 to 4 x / day as needed for Facial Pain (Patient taking differently: Take 300-600 mg by mouth See admin instructions. Take 300 mg in the morning, 300 mg at 1400 and 600 mg at bedtime for facial pain) 360 tablet 3  . [DISCONTINUED] omeprazole (PRILOSEC OTC) 20 MG tablet Take 20 mg by mouth daily.     No current facility-administered medications on file prior to visit.    Medical History:  Past Medical History:  Diagnosis Date  . Anxiety   . Arthritis   . Blood transfusion   . C. difficile colitis   . Candida esophagitis ([place]) 2013   EGD  . Cataract   . Chronic kidney disease    STONES  . Diverticulitis 04/05/2017  . Diverticulosis of colon (without mention of hemorrhage) 2007   Colonoscopy  . Dysrhythmia    RX  . Emphysema of lung ([place])   . Family history of colon cancer    sister  . Fibromyalgia   . Fracture, zygoma closed ([place]) 07/13/2011  . GERD (gastroesophageal reflux disease)   . Headache(784.0)   . Hyperlipemia   . Hypertension   . Kidney stones   . Ptosis, bilateral   . Renal infarct ([place])   . Splenic infarct    Allergies:  Allergies  Allergen Reactions  . Latex Hives    TONGUE HAD BLISTERS WHEN  HAD DENTAL SURGERY  . Amoxicillin Nausea And Vomiting  . Aspirin Other (See Comments)    Gi upset  . Barbiturates Other (See Comments)    Unknown reaction  . Celebrex [Celecoxib] Other (See Comments)    Unknown reaction  . Codeine Itching  . Evista [Raloxifene] Other (See Comments)    Unknown reaction  . Lipitor [Atorvastatin] Other (See Comments)    Unknown reaction  . Morphine And Related Other (See Comments)    Unknown reaction  . Oruvail [Ketoprofen] Other (See Comments)    Unknown reaction  . Pantoprazole Diarrhea  . Paraffin Other (  See Comments)    Unknown reaction  . Prochlorperazine Other (See Comments)    Uncontrolled shaking  . Proloprim [Trimethoprim] Other (See Comments)    Unknown reaction  . Venlafaxine Nausea Only  . Vibra-Tab [Doxycycline] Other (See Comments)    Unknown reaction  . Vioxx [Rofecoxib] Other (See Comments)    edema  . Betadine [Povidone Iodine] Rash  . Caffeine Palpitations   Review of Systems:  Review of Systems  Constitutional: Positive for malaise/fatigue. Negative for chills, diaphoresis, fever and weight loss.  HENT: Negative for congestion, ear discharge, ear pain, hearing loss, nosebleeds, sore throat and tinnitus.   Eyes: Negative.   Respiratory: Negative.  Negative for stridor.   Cardiovascular: Negative.   Gastrointestinal: Positive for heartburn. Negative for abdominal pain, blood in stool, constipation, diarrhea, melena, nausea and vomiting.  Genitourinary: Negative.   Musculoskeletal: Positive for back pain and myalgias. Negative for falls, joint pain and neck pain.  Skin: Negative.   Neurological: Positive for headaches. Negative for dizziness, tingling, tremors, sensory change, speech change, focal weakness, seizures, loss of consciousness and weakness.  Psychiatric/Behavioral: Negative.     Family history- Review and unchanged Social history- Review and unchanged Physical Exam: BP 118/80   Pulse 62   Temp 97.7 F (36.5  C)   Wt 110 lb (49.9 kg)   SpO2 99%   BMI 19.18 kg/m  Wt Readings from Last 3 Encounters:  03/12/19 110 lb (49.9 kg)  01/21/19 114 lb (51.7 kg)  12/18/18 110 lb 12.8 oz (50.3 kg)   General Appearance: Thin,frail, in no apparent distress. Eyes: PERRLA, EOMs, conjunctiva no swelling or erythema Sinuses: No Frontal/maxillary tenderness ENT/Mouth: Ext aud canals clear, TMs without erythema, bulging. No erythema, swelling, or exudate on post pharynx.  Tonsils not swollen or erythematous. Hearing normal. + tenderness right temple/head Neck: Supple, thyroid normal.  Respiratory: Respiratory effort normal, BS equal bilaterally without rales, rhonchi, wheezing or stridor.  Cardio: RRR with no MRGs. Brisk peripheral pulses without edema.  Abdomen: Soft, + BS.  Non tender, no guarding, rebound, hernias, masses. Lymphatics: Non tender without lymphadenopathy.  Musculoskeletal: Full ROM, 5/5 strength, normal gait.  Skin: Warm, dry without rashes, lesions, ecchymosis.  Neuro: Cranial nerves intact. Normal muscle tone, no cerebellar symptoms. Sensation intact.  Psych: Awake and oriented X 3, normal affect, Insight and Judgment appropriate.    Vicie Mutters, PA-C 9:46 AM [facility]

## 2019-03-12 ENCOUNTER — Ambulatory Visit (INDEPENDENT_AMBULATORY_CARE_PROVIDER_SITE_OTHER): Payer: PPO | Admitting: Physician Assistant

## 2019-03-12 ENCOUNTER — Encounter: Payer: Self-pay | Admitting: Physician Assistant

## 2019-03-12 ENCOUNTER — Other Ambulatory Visit: Payer: Self-pay

## 2019-03-12 VITALS — BP 118/80 | HR 62 | Temp 97.7°F | Wt 110.0 lb

## 2019-03-12 DIAGNOSIS — Z23 Encounter for immunization: Secondary | ICD-10-CM | POA: Diagnosis not present

## 2019-03-12 DIAGNOSIS — E559 Vitamin D deficiency, unspecified: Secondary | ICD-10-CM | POA: Diagnosis not present

## 2019-03-12 DIAGNOSIS — I10 Essential (primary) hypertension: Secondary | ICD-10-CM | POA: Diagnosis not present

## 2019-03-12 DIAGNOSIS — I771 Stricture of artery: Secondary | ICD-10-CM | POA: Diagnosis not present

## 2019-03-12 DIAGNOSIS — J439 Emphysema, unspecified: Secondary | ICD-10-CM

## 2019-03-12 DIAGNOSIS — E441 Mild protein-calorie malnutrition: Secondary | ICD-10-CM | POA: Diagnosis not present

## 2019-03-12 DIAGNOSIS — E782 Mixed hyperlipidemia: Secondary | ICD-10-CM

## 2019-03-12 DIAGNOSIS — I700 Atherosclerosis of aorta: Secondary | ICD-10-CM

## 2019-03-12 DIAGNOSIS — Z79899 Other long term (current) drug therapy: Secondary | ICD-10-CM

## 2019-03-12 DIAGNOSIS — I4891 Unspecified atrial fibrillation: Secondary | ICD-10-CM | POA: Insufficient documentation

## 2019-03-12 DIAGNOSIS — R7309 Other abnormal glucose: Secondary | ICD-10-CM

## 2019-03-12 NOTE — Patient Instructions (Signed)
WEIGHT GAIN  Try to make sure you are eating plenty of high calorie dense foods like: Avocado Nuts Peanut butter Oatmeal Dates Cottage cheese [place] yogurt Protein powder  Ways to prevent diarrhea with magnesium:  1) Don't take all your magnesium at the same time, have 2-3 smaller doses through out the day 2) Try taking your magnesium with high fiber meals.  3) If this does not help, take the magnesium on an empty stomach. Fiber for some people can bind the magnesium too well and prevent absorption in your gut.  4) Lastly try different types of magnesium. Most people are taking magnesium citrate, you can also try dimalate capsules which are slow release. You can also find magnesium lotions/sprays for the skin that bypass the gut. Another one that has good absorption is ReMag (pico-iconic magnesium formula), this has great cellular absorption so less of a laxative effect. You can find these type at health food stores or online.

## 2019-03-13 LAB — CBC WITH DIFFERENTIAL/PLATELET
Absolute Monocytes: 627 cells/uL (ref 200–950)
Basophils Absolute: 38 cells/uL (ref 0–200)
Basophils Relative: 0.6 %
Eosinophils Absolute: 58 cells/uL (ref 15–500)
Eosinophils Relative: 0.9 %
HCT: 35.8 % (ref 35.0–45.0)
Hemoglobin: 11.7 g/dL (ref 11.7–15.5)
Lymphs Abs: 1542 cells/uL (ref 850–3900)
MCH: 29.4 pg (ref 27.0–33.0)
MCHC: 32.7 g/dL (ref 32.0–36.0)
MCV: 89.9 fL (ref 80.0–100.0)
MPV: 11.9 fL (ref 7.5–12.5)
Monocytes Relative: 9.8 %
Neutro Abs: 4134 cells/uL (ref 1500–7800)
Neutrophils Relative %: 64.6 %
Platelets: 191 10*3/uL (ref 140–400)
RBC: 3.98 10*6/uL (ref 3.80–5.10)
RDW: 11.8 % (ref 11.0–15.0)
Total Lymphocyte: 24.1 %
WBC: 6.4 10*3/uL (ref 3.8–10.8)

## 2019-03-13 LAB — COMPLETE METABOLIC PANEL WITH GFR
AG Ratio: 2.1 (calc) (ref 1.0–2.5)
ALT: 9 U/L (ref 6–29)
AST: 18 U/L (ref 10–35)
Albumin: 4.6 g/dL (ref 3.6–5.1)
Alkaline phosphatase (APISO): 65 U/L (ref 37–153)
BUN: 13 mg/dL (ref 7–25)
CO2: 31 mmol/L (ref 20–32)
Calcium: 9.4 mg/dL (ref 8.6–10.4)
Chloride: 97 mmol/L — ABNORMAL LOW (ref 98–110)
Creat: 0.88 mg/dL (ref 0.60–0.88)
GFR, Est African American: 70 mL/min/{1.73_m2} (ref >=60)
GFR, Est Non African American: 61 mL/min/{1.73_m2} (ref >=60)
Globulin: 2.2 g/dL (calc) (ref 1.9–3.7)
Glucose, Bld: 100 mg/dL — ABNORMAL HIGH (ref 65–99)
Potassium: 3.9 mmol/L (ref 3.5–5.3)
Sodium: 136 mmol/L (ref 135–146)
Total Bilirubin: 0.4 mg/dL (ref 0.2–1.2)
Total Protein: 6.8 g/dL (ref 6.1–8.1)

## 2019-03-13 LAB — MAGNESIUM: Magnesium: 1.8 mg/dL (ref 1.5–2.5)

## 2019-03-13 LAB — VITAMIN D 25 HYDROXY (VIT D DEFICIENCY, FRACTURES): Vit D, 25-Hydroxy: 61 ng/mL (ref 30–100)

## 2019-03-13 LAB — LIPID PANEL
Cholesterol: 150 mg/dL (ref <200)
HDL: 62 mg/dL (ref >=50)
LDL Cholesterol (Calc): 64 mg/dL (calc)
Non-HDL Cholesterol (Calc): 88 mg/dL (calc) (ref <130)
Total CHOL/HDL Ratio: 2.4 (calc) (ref <5.0)
Triglycerides: 160 mg/dL — ABNORMAL HIGH (ref <150)

## 2019-03-13 LAB — TSH: TSH: 0.98 mIU/L (ref 0.40–4.50)

## 2019-03-13 LAB — HEMOGLOBIN A1C
Hgb A1c MFr Bld: 5.8 % of total Hgb — ABNORMAL HIGH (ref <5.7)
Mean Plasma Glucose: 120 (calc)
eAG (mmol/L): 6.6 (calc)

## 2019-03-17 ENCOUNTER — Other Ambulatory Visit: Payer: Self-pay | Admitting: Physician Assistant

## 2019-03-17 ENCOUNTER — Other Ambulatory Visit: Payer: Self-pay | Admitting: Internal Medicine

## 2019-03-17 DIAGNOSIS — I700 Atherosclerosis of aorta: Secondary | ICD-10-CM

## 2019-03-17 DIAGNOSIS — N280 Ischemia and infarction of kidney: Secondary | ICD-10-CM

## 2019-03-17 DIAGNOSIS — E782 Mixed hyperlipidemia: Secondary | ICD-10-CM

## 2019-03-17 MED ORDER — LOVASTATIN 20 MG PO TABS: ORAL_TABLET | ORAL | 3 refills | Status: DC

## 2019-04-14 ENCOUNTER — Other Ambulatory Visit: Payer: Self-pay | Admitting: Adult Health

## 2019-05-04 ENCOUNTER — Other Ambulatory Visit: Payer: Self-pay

## 2019-05-04 ENCOUNTER — Encounter: Payer: Self-pay | Admitting: Internal Medicine

## 2019-05-04 ENCOUNTER — Ambulatory Visit (INDEPENDENT_AMBULATORY_CARE_PROVIDER_SITE_OTHER): Payer: PPO | Admitting: Internal Medicine

## 2019-05-04 VITALS — BP 132/70 | HR 56 | Temp 97.2°F | Resp 16 | Ht 63.5 in | Wt 107.0 lb

## 2019-05-04 DIAGNOSIS — R300 Dysuria: Secondary | ICD-10-CM | POA: Diagnosis not present

## 2019-05-04 DIAGNOSIS — R5383 Other fatigue: Secondary | ICD-10-CM | POA: Diagnosis not present

## 2019-05-04 DIAGNOSIS — M791 Myalgia, unspecified site: Secondary | ICD-10-CM

## 2019-05-04 DIAGNOSIS — Z79899 Other long term (current) drug therapy: Secondary | ICD-10-CM

## 2019-05-04 NOTE — Progress Notes (Signed)
History of Present Illness:       This very nice 85 y.o.MWFwith  HTN, cAfibHLD, Prediabetes, GERD, Fibromyalgia and Vitamin D Deficiency presents with her usual c/o "I just don't feel good" and multiple other symptoms of generalized fatigue / weakness limb girdle / proximal myalgias - "legs hurt all the time" / joint aches as "knees hurt". Also c/o dysuria.   Medications  .  atenolol (TENORMIN) 100 MG tablet, TAKE 1 TABLET BY MOUTH DAILY FOR BLOOD PRESSURE .  digoxin (LANOXIN) 0.125 MG tablet, Take 1 tablet Daily for fast / Irregular Heart Beat .  furosemide (LASIX) 40 MG tablet, Take 1/2 to 1 tablet Daily for Fluid Retention & Ankle Swelling .  lovastatin (MEVACOR) 20 MG tablet, Take 1 tablet at Bedtime for Cholesterol .  olmesartan (BENICAR) 40 MG tablet, Take 1/2 to 1 tablet daily for BP .  acetaminophen (TYLENOL) 500 MG tablet, Take 500-1,000 mg by mouth every 6 (six) hours as needed for headache. Marland Kitchen  apixaban (ELIQUIS) 5 MG TABS tablet, Take 1 tablet 2 x /day to Prevent Blood Clots .  Cholecalciferol (VITAMIN D) 2000 units tablet, Take 4,000 Units by mouth daily.  .  clonazePAM (KLONOPIN) 1 MG tablet, Take 1/2-1 tablet 2 - 3 x /day ONLY if needed for Anxiety Attack &  limit to 5 days /week to avoid addiction .  diclofenac sodium (VOLTAREN) 1 % GEL, Apply 2 g topically 4 (four) times daily. Marland Kitchen  dicyclomine (BENTYL) 10 MG capsule, Take 1 capsule (10 mg total) by mouth 3 (three) times daily before meals. .  hyoscyamine (LEVSIN SL) 0.125 MG SL tablet, DISSOLVE 1 TABLET UNDER THE TONGUE EVERY4 HOURS AS NEEDED FOR NAUSEA,CRAMPING, BLOATING OR DIARRHEA .  ondansetron (ZOFRAN) 8 MG tablet, Take 1 tablet 3 x  /day if needed for Nausea .  gabapentin (NEURONTIN) 600 MG tablet, Take 1/2 to 1 tablet s 3 to 4 x / day as needed for Facial Pain (Patient taking differently: Take 300-600 mg by mouth See admin instructions. Take 300 mg in the morning, 300 mg at 1400 and 600 mg at bedtime for facial pain)  .  omeprazole (PRILOSEC) 40 MG capsule, TAKE 1 CAPSULE BY MOUTH DAILY BEFORE BREAKFAST  Problem list She has Headache, post-traumatic, chronic; Hypertension; Hyperlipidemia; GERD; Vitamin D deficiency; Atypical facial pain; Medication management; Encounter for Medicare annual wellness exam; Mild malnutrition ([place]); COPD (chronic obstructive pulmonary disease) with emphysema ([place]); Tortuous aorta ([place]); Osteoporosis; Aortic atherosclerosis ([place]); Hepatic steatosis; Splenic infarct; Renal infarct ([place]); Chronic hyponatremia; Chronic hyperkalemia; Coronary atherosclerosis due to lipid rich plaque; and Atrial fibrillation with controlled ventricular response (HCC) on their problem list.   Observations/Objective:  BP 132/70   Pulse (!) 56   Temp (!) 97.2 F (36.2 C)   Resp 16   Ht 5' 3.5" (1.613 m)   Wt 107 lb (48.5 kg)   BMI 18.66 kg/m     Thin, pale, elderly pale WF with flat depressed affect.   HEENT - WNL. Neck - supple.  Chest - Clear equal BS. Cor - Nl HS. RRR w/o sig MGR. PP 1(+). No edema. MS- FROM w/o deformities.  Generalized decrease in muscle power, tone & gait Nl. Neuro -  Nl w/o focal abnormalities.  Assessment and Plan:  1. Fatigue, unspecified type  - CBC with Diff - TSH - Urinalysis, Routine w reflex microscopic - Culture, Urine - CK  2. Myalgia  - TSH - CK - C-reactive protein - Sedimentation rate  3.  Dysuria  - CBC with Diff - Urinalysis, Routine w reflex microscopic - Culture, Urine  4. Medication management  - CBC with Diff - TSH - Urinalysis, Routine w reflex microscopic - Culture, Urine - CK - C-reactive protein - Sedimentation rate  Follow Up Instructions:  - Disposition pending results of labs.    I discussed the assessment and treatment plan with the patient. The patient was provided an opportunity to ask questions and all were answered. The patient agreed with the plan and demonstrated an understanding of the instructions.    Between 20- 24  minutes of counseling, chart review, and critical decision making was performed  Kirtland Bouchard, MD

## 2019-05-06 LAB — URINE CULTURE
MICRO NUMBER:: 1080044
SPECIMEN QUALITY:: ADEQUATE

## 2019-05-06 LAB — CBC WITH DIFFERENTIAL/PLATELET
Absolute Monocytes: 778 cells/uL (ref 200–950)
Basophils Absolute: 43 cells/uL (ref 0–200)
Basophils Relative: 0.6 %
Eosinophils Absolute: 101 cells/uL (ref 15–500)
Eosinophils Relative: 1.4 %
HCT: 37.3 % (ref 35.0–45.0)
Hemoglobin: 12.1 g/dL (ref 11.7–15.5)
Lymphs Abs: 2246 cells/uL (ref 850–3900)
MCH: 27.9 pg (ref 27.0–33.0)
MCHC: 32.4 g/dL (ref 32.0–36.0)
MCV: 86.1 fL (ref 80.0–100.0)
MPV: 11.1 fL (ref 7.5–12.5)
Monocytes Relative: 10.8 %
Neutro Abs: 4032 cells/uL (ref 1500–7800)
Neutrophils Relative %: 56 %
Platelets: 227 10*3/uL (ref 140–400)
RBC: 4.33 10*6/uL (ref 3.80–5.10)
RDW: 13.4 % (ref 11.0–15.0)
Total Lymphocyte: 31.2 %
WBC: 7.2 10*3/uL (ref 3.8–10.8)

## 2019-05-06 LAB — URINALYSIS, ROUTINE W REFLEX MICROSCOPIC
Bacteria, UA: NONE SEEN /HPF
Bilirubin Urine: NEGATIVE
Glucose, UA: NEGATIVE
Hgb urine dipstick: NEGATIVE
Hyaline Cast: NONE SEEN /LPF
Ketones, ur: NEGATIVE
Nitrite: NEGATIVE
Protein, ur: NEGATIVE
RBC / HPF: NONE SEEN /HPF (ref 0–2)
Specific Gravity, Urine: 1.007 (ref 1.001–1.03)
Squamous Epithelial / HPF: NONE SEEN /HPF (ref <=5)
WBC, UA: NONE SEEN /HPF (ref 0–5)
pH: 7 (ref 5.0–8.0)

## 2019-05-06 LAB — SEDIMENTATION RATE: Sed Rate: 2 mm/h (ref 0–30)

## 2019-05-06 LAB — CK: Total CK: 76 U/L (ref 29–143)

## 2019-05-06 LAB — TSH: TSH: 1.32 mIU/L (ref 0.40–4.50)

## 2019-05-06 LAB — C-REACTIVE PROTEIN: CRP: 1.6 mg/L (ref <8.0)

## 2019-05-08 ENCOUNTER — Ambulatory Visit: Payer: Self-pay | Admitting: Physician Assistant

## 2019-06-03 ENCOUNTER — Other Ambulatory Visit: Payer: Self-pay | Admitting: Internal Medicine

## 2019-06-03 ENCOUNTER — Telehealth: Payer: Self-pay | Admitting: *Deleted

## 2019-06-03 MED ORDER — AZITHROMYCIN 250 MG PO TABS: ORAL_TABLET | ORAL | 1 refills | Status: DC

## 2019-06-03 NOTE — Telephone Encounter (Signed)
Patient called and complained of sinus pain.  Dr [name] sent an RX for a Z-pak to her pharmacy.  Patient is aware.

## 2019-06-05 ENCOUNTER — Emergency Department (HOSPITAL_COMMUNITY): Payer: PPO

## 2019-06-05 ENCOUNTER — Other Ambulatory Visit: Payer: Self-pay

## 2019-06-05 ENCOUNTER — Encounter (HOSPITAL_COMMUNITY): Payer: Self-pay | Admitting: Emergency Medicine

## 2019-06-05 ENCOUNTER — Emergency Department (HOSPITAL_COMMUNITY)
Admission: EM | Admit: 2019-06-05 | Discharge: 2019-06-05 | Disposition: A | Discharge status: Home / Self Care | Payer: PPO | Attending: Emergency Medicine | Admitting: Emergency Medicine

## 2019-06-05 DIAGNOSIS — Z91040 Latex allergy status: Secondary | ICD-10-CM | POA: Insufficient documentation

## 2019-06-05 DIAGNOSIS — R1030 Lower abdominal pain, unspecified: Secondary | ICD-10-CM | POA: Diagnosis not present

## 2019-06-05 DIAGNOSIS — J449 Chronic obstructive pulmonary disease, unspecified: Secondary | ICD-10-CM | POA: Insufficient documentation

## 2019-06-05 DIAGNOSIS — G8929 Other chronic pain: Secondary | ICD-10-CM | POA: Insufficient documentation

## 2019-06-05 DIAGNOSIS — R319 Hematuria, unspecified: Secondary | ICD-10-CM | POA: Diagnosis not present

## 2019-06-05 DIAGNOSIS — Z7901 Long term (current) use of anticoagulants: Secondary | ICD-10-CM | POA: Diagnosis not present

## 2019-06-05 DIAGNOSIS — I10 Essential (primary) hypertension: Secondary | ICD-10-CM | POA: Diagnosis not present

## 2019-06-05 DIAGNOSIS — K5730 Diverticulosis of large intestine without perforation or abscess without bleeding: Secondary | ICD-10-CM | POA: Diagnosis not present

## 2019-06-05 DIAGNOSIS — I517 Cardiomegaly: Secondary | ICD-10-CM | POA: Diagnosis not present

## 2019-06-05 DIAGNOSIS — R350 Frequency of micturition: Secondary | ICD-10-CM | POA: Insufficient documentation

## 2019-06-05 DIAGNOSIS — Z79899 Other long term (current) drug therapy: Secondary | ICD-10-CM | POA: Insufficient documentation

## 2019-06-05 DIAGNOSIS — R918 Other nonspecific abnormal finding of lung field: Secondary | ICD-10-CM | POA: Diagnosis not present

## 2019-06-05 DIAGNOSIS — M545 Low back pain: Secondary | ICD-10-CM | POA: Diagnosis not present

## 2019-06-05 DIAGNOSIS — R519 Headache, unspecified: Secondary | ICD-10-CM | POA: Diagnosis not present

## 2019-06-05 DIAGNOSIS — R188 Other ascites: Secondary | ICD-10-CM | POA: Diagnosis not present

## 2019-06-05 DIAGNOSIS — R300 Dysuria: Secondary | ICD-10-CM | POA: Diagnosis not present

## 2019-06-05 DIAGNOSIS — Z20828 Contact with and (suspected) exposure to other viral communicable diseases: Secondary | ICD-10-CM | POA: Diagnosis not present

## 2019-06-05 LAB — CBC WITH DIFFERENTIAL/PLATELET
Abs Immature Granulocytes: 0.04 10*3/uL (ref 0.00–0.07)
Basophils Absolute: 0 10*3/uL (ref 0.0–0.1)
Basophils Relative: 0 %
Eosinophils Absolute: 0 10*3/uL (ref 0.0–0.5)
Eosinophils Relative: 0 %
HCT: 36.7 % (ref 36.0–46.0)
Hemoglobin: 11.5 g/dL — ABNORMAL LOW (ref 12.0–15.0)
Immature Granulocytes: 0 %
Lymphocytes Relative: 10 %
Lymphs Abs: 0.9 10*3/uL (ref 0.7–4.0)
MCH: 29.1 pg (ref 26.0–34.0)
MCHC: 31.3 g/dL (ref 30.0–36.0)
MCV: 92.9 fL (ref 80.0–100.0)
Monocytes Absolute: 0.8 10*3/uL (ref 0.1–1.0)
Monocytes Relative: 8 %
Neutro Abs: 7.7 10*3/uL (ref 1.7–7.7)
Neutrophils Relative %: 82 %
Platelets: 213 10*3/uL (ref 150–400)
RBC: 3.95 MIL/uL (ref 3.87–5.11)
RDW: 16 % — ABNORMAL HIGH (ref 11.5–15.5)
WBC: 9.6 10*3/uL (ref 4.0–10.5)
nRBC: 0 % (ref 0.0–0.2)

## 2019-06-05 LAB — URINALYSIS, ROUTINE W REFLEX MICROSCOPIC
Bilirubin Urine: NEGATIVE
Glucose, UA: NEGATIVE mg/dL
Ketones, ur: NEGATIVE mg/dL
Nitrite: NEGATIVE
Protein, ur: 30 mg/dL — AB
RBC / HPF: >50 RBC/hpf — ABNORMAL HIGH (ref 0–5)
Specific Gravity, Urine: 1.008 (ref 1.005–1.030)
pH: 7 (ref 5.0–8.0)

## 2019-06-05 LAB — COMPREHENSIVE METABOLIC PANEL
ALT: 18 U/L (ref 0–44)
AST: 26 U/L (ref 15–41)
Albumin: 4.3 g/dL (ref 3.5–5.0)
Alkaline Phosphatase: 82 U/L (ref 38–126)
Anion gap: 13 (ref 5–15)
BUN: 10 mg/dL (ref 8–23)
CO2: 28 mmol/L (ref 22–32)
Calcium: 9.3 mg/dL (ref 8.9–10.3)
Chloride: 96 mmol/L — ABNORMAL LOW (ref 98–111)
Creatinine, Ser: 0.76 mg/dL (ref 0.44–1.00)
GFR calc Af Amer: >60 mL/min (ref >60)
GFR calc non Af Amer: >60 mL/min (ref >60)
Glucose, Bld: 111 mg/dL — ABNORMAL HIGH (ref 70–99)
Potassium: 4.3 mmol/L (ref 3.5–5.1)
Sodium: 137 mmol/L (ref 135–145)
Total Bilirubin: 1.2 mg/dL (ref 0.3–1.2)
Total Protein: 7.1 g/dL (ref 6.5–8.1)

## 2019-06-05 LAB — TROPONIN I (HIGH SENSITIVITY)
Troponin I (High Sensitivity): 10 ng/L (ref <18)
Troponin I (High Sensitivity): 11 ng/L (ref <18)

## 2019-06-05 MED ORDER — FUROSEMIDE 10 MG/ML IJ SOLN
20.0000 mg | Freq: Once | INTRAMUSCULAR | Status: AC
  Administered 2019-06-05: 20 mg via INTRAVENOUS
  Filled 2019-06-05: qty 4

## 2019-06-05 MED ORDER — SODIUM CHLORIDE 0.9 % IV SOLN
1.0000 g | Freq: Once | INTRAVENOUS | Status: AC
  Administered 2019-06-05: 1 g via INTRAVENOUS
  Filled 2019-06-05: qty 10

## 2019-06-05 MED ORDER — ALPRAZOLAM 0.5 MG PO TABS
0.5000 mg | ORAL_TABLET | Freq: Once | ORAL | Status: AC
  Administered 2019-06-05: 0.5 mg via ORAL
  Filled 2019-06-05: qty 1

## 2019-06-05 MED ORDER — CEPHALEXIN 500 MG PO CAPS: 500.0000 mg | ORAL_CAPSULE | Freq: Two times a day (BID) | ORAL | 0 refills | Status: DC

## 2019-06-05 MED ORDER — GABAPENTIN 300 MG PO CAPS
300.0000 mg | ORAL_CAPSULE | Freq: Once | ORAL | Status: AC
  Administered 2019-06-05: 300 mg via ORAL
  Filled 2019-06-05: qty 1

## 2019-06-05 NOTE — ED Notes (Signed)
Patient given discharge teaching and verbalized understanding. Patient was taken out of ED w/ wheelchair. Patient called brother to pick up and take patient home. Patient is waiting in ED for brother to pick up patient.

## 2019-06-05 NOTE — ED Triage Notes (Signed)
Patient arrived by EMS from home. Patient c/o dysuria, increased frequency, and hematuria x 2 days.   Pt is on antibiotic for sinus infection currently.   History of UTI's.   Pt is currently on Eliquis.   EMS VS [facility]DL.

## 2019-06-05 NOTE — ED Notes (Signed)
ED Provider at bedside. 

## 2019-06-05 NOTE — ED Notes (Signed)
PATIENT REFUSED COVID TESTING.

## 2019-06-05 NOTE — ED Notes (Signed)
Patient ambulated in room w/ a steady gate. Patient's SPO2 remained 96%.

## 2019-06-05 NOTE — ED Notes (Signed)
Pt was ambulated to restroom w/o assistance.

## 2019-06-05 NOTE — ED Notes (Signed)
Patient transported to CT 

## 2019-06-05 NOTE — Discharge Instructions (Addendum)
Your testing is reassuring.  Take the antibiotic for possible urinary tract infection.  Follow-up with your doctor as well as with the urologist.  Return to the ED if you are not able to eat, drink, have worsening pain, fever, vomiting or other concerns.

## 2019-06-05 NOTE — ED Provider Notes (Addendum)
[facility] DEPT Provider Note   CSN: [id number] Arrival date & time: 06/05/19  0741     History Chief Complaint  Patient presents with  . Dysuria    Debra White is a 85 y.o. female.  Level 5 caveat for hearing difficulty.  Patient here from home with several episodes of hematuria, dysuria and frequency.  States this occurred when she woke up this morning around 5 AM.  She is had several episodes of grossly bloody urine which concerned her because she does take Eliquis.  She denies any falls or trauma.  She states she has blood with urine every time she urinates and pain or frequency.  No fever or vomiting.  Some low back pain which appears to be chronic for her.  M also has suprapubic pain as well.  No chest pain or shortness of breath.  States she has a history of kidney stones in the remote past as well as a renal infarct.  States compliance with her Eliquis.  She is currently taking Zithromax for a sinus infection and has been on that for the past 2 days.  Denies any cough or fever.  The history is provided by the patient.       Past Medical History:  Diagnosis Date  . Anxiety   . Arthritis   . Blood transfusion   . C. difficile colitis   . Candida esophagitis ([place]) 2013   EGD  . Cataract   . Chronic kidney disease    STONES  . Diverticulitis 04/05/2017  . Diverticulosis of colon (without mention of hemorrhage) 2007   Colonoscopy  . Dysrhythmia    RX  . Emphysema of lung ([place])   . Family history of colon cancer    sister  . Fibromyalgia   . Fracture, zygoma closed ([place]) 07/13/2011  . GERD (gastroesophageal reflux disease)   . Headache(784.0)   . Hyperlipemia   . Hypertension   . Kidney stones   . Ptosis, bilateral   . Renal infarct ([place])   . Splenic infarct     Patient Active Problem List   Diagnosis Date Noted  . Atrial fibrillation with controlled ventricular response ([place]) 03/12/2019  . Coronary atherosclerosis  due to lipid rich plaque 03/26/2018  . Splenic infarct 03/22/2018  . Renal infarct ([place]) 03/22/2018  . Chronic hyponatremia 03/22/2018  . Chronic hyperkalemia 03/22/2018  . Hepatic steatosis 11/17/2017  . Aortic atherosclerosis ([place]) 08/08/2017  . COPD (chronic obstructive pulmonary disease) with emphysema ([place]) 12/21/2015  . 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 Osteoporosis 12/21/2015  . Mild malnutrition ([place]) 05/11/2015  . Encounter for Medicare annual wellness exam 04/08/2015  . Medication management 01/27/2014  . 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 Vitamin D deficiency 07/08/2013  . Hypertension   . Hyperlipidemia   . GERD   . Headache, post-traumatic, chronic 09/20/2011    Past Surgical History:  Procedure Laterality Date  . ABDOMINAL HYSTERECTOMY    . BACK SURGERY     X2  . BACK SURGERY    . BREAST EXCISIONAL BIOPSY    . CERVICAL DISC SURGERY    . COLONOSCOPY    . FACIAL FRACTURE SURGERY    . HAND SURGERY     BIL   . ORIF TRIPOD FRACTURE  07/13/2011   Procedure: OPEN REDUCTION INTERNAL FIXATION (ORIF) TRIPOD FRACTURE;  Surgeon: Tyson Alias, MD;  Location: [facility];  Service: ENT;  Laterality: Right;  ORIF RIGHT ZYGOMA, ORBITAL [place]  STITCH (TEMPORARY TARSORRHAPHY)  . PTOSIS REPAIR Bilateral 11/22/2016   Procedure: INTERNAL PTOSIS REPAIR;  Surgeon: Clista Bernhardt, MD;  Location: [facility];  Service: Ophthalmology;  Laterality: Bilateral;  . SHOULDER ARTHROSCOPY W/ ROTATOR CUFF REPAIR     LFT     OB History   No obstetric history on file.     Family History  Problem Relation Age of Onset  . Heart disease Mother   . Heart disease Father   . Heart attack Father   . Breast cancer Sister 50  . Colon cancer Sister   . Cancer Sister        breast  . Heart disease Sister   . Cancer Sister        colon  . Cancer Sister        colon  . Hypertension Daughter   . Hyperlipidemia Daughter     Social History   Tobacco Use  . Smoking status:  Never Smoker  . Smokeless tobacco: Never Used  Substance Use Topics  . Alcohol use: No  . Drug use: No    Home Medications Prior to Admission medications   Medication Sig Start Date End Date Taking? Authorizing Provider  acetaminophen (TYLENOL) 500 MG tablet Take 500-1,000 mg by mouth every 6 (six) hours as needed for headache.    [provider]  apixaban (ELIQUIS) 5 MG TABS tablet Take 1 tablet 2 x /day to Prevent Blood Clots 04/14/19   Unk Pinto, MD  atenolol (TENORMIN) 100 MG tablet TAKE 1 TABLET BY MOUTH DAILY FOR BLOOD PRESSURE 09/10/18   Unk Pinto, MD  azithromycin (ZITHROMAX) 250 MG tablet Take 2 tablets with Food on  Day 1, then 1 tablet Daily with Food for Infection 06/03/19   Unk Pinto, MD  Cholecalciferol (VITAMIN D) 2000 units tablet Take 4,000 Units by mouth daily.     [provider]  clonazePAM (KLONOPIN) 1 MG tablet Take 1/2-1 tablet 2 - 3 x /day ONLY if needed for Anxiety Attack &  limit to 5 days /week to avoid addiction 04/14/19   Unk Pinto, MD  diclofenac sodium (VOLTAREN) 1 % GEL Apply 2 g topically 4 (four) times daily. 06/02/18   Unk Pinto, MD  dicyclomine (BENTYL) 10 MG capsule Take 1 capsule (10 mg total) by mouth 3 (three) times daily before meals. 06/09/18   Ladene Artist, MD  digoxin (LANOXIN) 0.125 MG tablet Take 1 tablet Daily for fast / Irregular Heart Beat 12/09/18   Unk Pinto, MD  furosemide (LASIX) 40 MG tablet Take 1/2 to 1 tablet Daily for Fluid Retention & Ankle Swelling 01/21/19   Unk Pinto, MD  gabapentin (NEURONTIN) 600 MG tablet Take 1/2 to 1 tablet s 3 to 4 x / day as needed for Facial Pain Patient taking differently: Take 300-600 mg by mouth See admin instructions. Take 300 mg in the morning, 300 mg at 1400 and 600 mg at bedtime for facial pain 11/14/17 11/15/18  Unk Pinto, MD  hyoscyamine (LEVSIN SL) 0.125 MG SL tablet DISSOLVE 1 TABLET UNDER THE TONGUE EVERY4 HOURS AS NEEDED FOR  NAUSEA,CRAMPING, BLOATING OR DIARRHEA 03/07/19   Liane Comber, NP  lovastatin (MEVACOR) 20 MG tablet Take 1 tablet at Bedtime for Cholesterol 03/17/19   Unk Pinto, MD  olmesartan [facility]) 40 MG tablet Take 1/2 to 1 tablet daily for BP 05/26/18   Unk Pinto, MD  omeprazole (PRILOSEC) 40 MG capsule TAKE 1 CAPSULE BY MOUTH DAILY BEFORE BREAKFAST 02/23/19   Silverio Lay,  Estill Bamberg, PA-C  ondansetron [facility]) 8 MG tablet Take 1 tablet 3 x  /day if needed for Nausea 04/14/18   Unk Pinto, MD  omeprazole (PRILOSEC OTC) 20 MG tablet Take 20 mg by mouth daily.  09/01/11  [provider]    Allergies    Latex, Amoxicillin, Aspirin, Barbiturates, Celebrex [celecoxib], Codeine, Evista [raloxifene], Lipitor [atorvastatin], Morphine and related, Oruvail [ketoprofen], Pantoprazole, Paraffin, Prochlorperazine, Proloprim [trimethoprim], Venlafaxine, Vibra-tab [doxycycline], Vioxx [rofecoxib], Betadine [povidone iodine], and Caffeine  Review of Systems   Review of Systems  Constitutional: Positive for fatigue. Negative for activity change, appetite change and fever.  HENT: Negative for congestion and rhinorrhea.   Eyes: Negative for visual disturbance.  Respiratory: Negative for cough, chest tightness and shortness of breath.   Cardiovascular: Negative for chest pain.  Gastrointestinal: Positive for abdominal pain. Negative for nausea and vomiting.  Genitourinary: Positive for dysuria, flank pain, hematuria and urgency.  Musculoskeletal: Positive for back pain. Negative for arthralgias and myalgias.  Skin: Negative for rash.  Neurological: Negative for dizziness, weakness and headaches.   all other systems are negative except as noted in the HPI and PMH.    Physical Exam Updated Vital Signs BP (!) 170/72 (BP Location: Left Arm)   Pulse 70   Temp 98.1 F (36.7 C) (Oral)   Resp 16   Wt 48.5 kg   SpO2 99%   BMI 18.64 kg/m   Physical Exam Vitals and nursing note reviewed.    Constitutional:      General: She is not in acute distress.    Appearance: She is well-developed.  HENT:     Head: Normocephalic and atraumatic.     Mouth/Throat:     Pharynx: No oropharyngeal exudate.  Eyes:     Conjunctiva/sclera: Conjunctivae normal.     Pupils: Pupils are equal, round, and reactive to light.  Neck:     Comments: No meningismus. Cardiovascular:     Rate and Rhythm: Normal rate and regular rhythm.     Heart sounds: Normal heart sounds. No murmur.  Pulmonary:     Effort: Pulmonary effort is normal. No respiratory distress.     Breath sounds: Normal breath sounds.  Chest:     Chest wall: No tenderness.  Abdominal:     Palpations: Abdomen is soft.     Tenderness: There is abdominal tenderness. There is no guarding or rebound.     Comments: Suprapubic tenderness.  Musculoskeletal:        General: No tenderness. Normal range of motion.     Cervical back: Normal range of motion and neck supple.     Comments: No CVAT  Skin:    General: Skin is warm.     Capillary Refill: Capillary refill takes less than 2 seconds.  Neurological:     General: No focal deficit present.     Mental Status: She is alert and oriented to person, place, and time. Mental status is at baseline.     Cranial Nerves: No cranial nerve deficit.     Motor: No abnormal muscle tone.     Coordination: Coordination normal.     Comments:  5/5 strength throughout. CN 2-12 intact.Equal grip strength.   Psychiatric:        Behavior: Behavior normal.     ED Results / Procedures / Treatments   Labs (all labs ordered are listed, but only abnormal results are displayed) Labs Reviewed  URINALYSIS, ROUTINE W REFLEX MICROSCOPIC - Abnormal; Notable for the following components:  Result Value   Hgb urine dipstick LARGE (*)    Protein, ur 30 (*)    Leukocytes,Ua MODERATE (*)    RBC / HPF >50 (*)    Bacteria, UA FEW (*)    All other components within normal limits  CBC WITH DIFFERENTIAL/PLATELET  - Abnormal; Notable for the following components:   Hemoglobin 11.5 (*)    RDW 16.0 (*)    All other components within normal limits  COMPREHENSIVE METABOLIC PANEL - Abnormal; Notable for the following components:   Chloride 96 (*)    Glucose, Bld 111 (*)    All other components within normal limits  URINE CULTURE  SARS CORONAVIRUS 2 (TAT 6-24 HRS)  TROPONIN I (HIGH SENSITIVITY)  TROPONIN I (HIGH SENSITIVITY)    EKG EKG Interpretation  Date/Time:  Friday June 05 2019 08:19:12 EST Ventricular Rate:  76 PR Interval:    QRS Duration: 80 QT Interval:  380 QTC Calculation: 428 R Axis:   52 Text Interpretation: Atrial fibrillation Borderline repolarization abnormality No significant change was found Confirmed by Ezequiel Essex [phone]) on 06/05/2019 11:28:13 AM   Radiology CT Head Wo Contrast  Result Date: 06/05/2019 CLINICAL DATA:  "Patient arrived by EMS from home. Patient c/o dysuria, increased frequency, and hematuria x 2 days. Pt is on antibiotic for sinus infection currently. "Head trauma, minor (Age >= 65y) headache, on eliquis EXAM: CT HEAD WITHOUT CONTRAST TECHNIQUE: Contiguous axial images were obtained from the base of the skull through the vertex without intravenous contrast. COMPARISON:  Head CT 07/22/2018 FINDINGS: Brain: No acute intracranial hemorrhage. No focal mass lesion. No CT evidence of acute infarction. No midline shift or mass effect. No hydrocephalus. Basilar cisterns are patent. There are periventricular and subcortical white matter hypodensities. Generalized cortical atrophy. Small lacunar infarction in the head of the RIGHT caudate nucleus not changed from prior. Small remote infarction in the RIGHT posterior occipital lobe (image 18/2) Vascular: No hyperdense vessel or unexpected calcification. Skull: Normal. Negative for fracture or focal lesion. Sinuses/Orbits: Paranasal sinuses and mastoid air cells are clear. Orbits are clear. Other: None. IMPRESSION: 1.  No acute intracranial findings. 2. Remote small infarction in the RIGHT occipital lobe and lacunar infarction in the RIGHT basal ganglia. Electronically Signed   By: Suzy Bouchard M.D.   On: 06/05/2019 15:45   DG Chest Portable 1 View  Result Date: 06/05/2019 CLINICAL DATA:  Dysuria, increased frequency and hematuria for 2 days. Patient on antibiotics for sinus infection. EXAM: PORTABLE CHEST 1 VIEW COMPARISON:  Current abdomen pelvis CT. Prior chest radiograph, 09/10/2016. FINDINGS: Cardiac silhouette is enlarged. No mediastinal or hilar masses. No evidence of adenopathy. There is interstitial thickening that is most evident in the lower lungs, which is similar to the prior chest radiograph. No evidence of lung consolidation. No visualized effusion and no pneumothorax. Skeletal structures are grossly intact. IMPRESSION: 1. Cardiomegaly with mild interstitial thickening most prominent in the lung bases, similar to the prior chest radiographs. Current chest CT lung bases suggested congestive heart failure with interstitial edema, which is supported on the current chest radiograph. No evidence of pneumonia. Electronically Signed   By: Lajean Manes M.D.   On: 06/05/2019 12:38   CT Renal Stone Study  Result Date: 06/05/2019 CLINICAL DATA:  85 year old female with history of hematuria. EXAM: CT ABDOMEN AND PELVIS WITHOUT CONTRAST TECHNIQUE: Multidetector CT imaging of the abdomen and pelvis was performed following the standard protocol without IV contrast. COMPARISON:  CTA of the abdomen and pelvis 04/25/2018.  FINDINGS: Lower chest: Mild ground-glass attenuation and interlobular septal thickening throughout the visualize lung bases, which likely reflects mild interstitial pulmonary edema given the moderate cardiomegaly and biatrial dilatation. Atherosclerotic calcifications in the descending thoracic aorta as well as the left main, left anterior descending and right coronary arteries. Hepatobiliary: No  suspicious cystic or solid hepatic lesions are confidently identified on today's noncontrast CT examination. There is some subtle amorphous intermediate attenuation material lying dependently in the gallbladder, which likely reflects biliary sludge or noncalcified gallstones. No definite signs of acute cholecystitis are noted at this time. Pancreas: No definite pancreatic mass or peripancreatic fluid collections or inflammatory changes noted on today's noncontrast CT examination. Spleen: Unremarkable. Adrenals/Urinary Tract: No calcifications are identified within the collecting system of either kidney, along the course of either ureter, or within the lumen of the urinary bladder. No hydroureteronephrosis. Unenhanced appearance of the kidneys and bilateral adrenal glands is normal. Urinary bladder is unremarkable in appearance. Stomach/Bowel: Unenhanced appearance of the stomach is normal. No pathologic dilatation of small bowel or colon. Numerous colonic diverticulae are noted, particularly in the sigmoid colon, without definite surrounding inflammatory changes to suggest an acute diverticulitis at this time. The appendix is not confidently identified and may be surgically absent. Regardless, there are no inflammatory changes noted adjacent to the cecum to suggest the presence of an acute appendicitis at this time. Vascular/Lymphatic: Aortic atherosclerosis. No lymphadenopathy noted in the abdomen or pelvis. Reproductive: Status post hysterectomy. Ovaries are not confidently identified may be surgically absent or atrophic. Other: Small volume of ascites, most evident in the cul-de-sac. No pneumoperitoneum. Musculoskeletal: There are no aggressive appearing lytic or blastic lesions noted in the visualized portions of the skeleton. IMPRESSION: 1. No findings to account for the patient's history of hematuria. Specifically, no urinary tract calculi no findings of urinary tract obstruction are noted at this time. 2.  Small volume of ascites. This is of uncertain etiology, however, this may relate to congestive heart failure given the appearance of the lower thorax (cardiomegaly with evidence of mild interstitial pulmonary edema). 3. Colonic diverticulosis without definite findings to suggest an acute diverticulitis at this time. 4. Probable biliary sludge and/or noncalcified gallstones lying dependently in the gallbladder, without evidence of acute cholecystitis at this time. 5. Aortic atherosclerosis, in addition to left main, and 2 vessel coronary artery disease. 6. Additional incidental findings, as above Electronically Signed   By: Vinnie Langton M.D.   On: 06/05/2019 11:14    Procedures Procedures (including critical care time)  Medications Ordered in ED Medications - No data to display  ED Course  I have reviewed the triage vital signs and the nursing notes.  Pertinent labs & imaging results that were available during my care of the patient were reviewed by me and considered in my medical decision making (see chart for details).    MDM Rules/Calculators/A&P     CHA2DS2/VAS Stroke Risk Points  Current as of 8 minutes ago     5 >= 2 Points: High Risk  1 - 1.99 Points: Medium Risk  0 Points: Low Risk    This is the only CHA2DS2/VAS Stroke Risk Points available for the past  year.: Last Change: N/A     Details    This score determines the patient's risk of having a stroke if the  patient has atrial fibrillation.       Points Metrics  0 Has Congestive Heart Failure:  No    Current as of 8 minutes ago  1 Has Vascular Disease:  Yes    Current as of 8 minutes ago  1 Has Hypertension:  Yes    Current as of 8 minutes ago  2 Age:  19    Current as of 8 minutes ago  0 Has Diabetes:  No    Current as of 8 minutes ago  0 Had Stroke:  No  Had TIA:  No  Had thromboembolism:  No    Current as of 8 minutes ago  1 Female:  Yes    Current as of 8 minutes ago                         Patient  here with hematuria, dysuria, frequency and urgency since this morning.  Denies fever.  UA with hematuria, no obvious infection. Culture sent.   CT shows no evidence to explain patient's hematuria.  No calculi or urinary tract obstruction.  There is a small volume of ascites likely due to CHF.  There is diverticulosis without diverticulitis.  There is biliary sludge without evidence of cholecystitis.  Creatinine is normal. Hemoglobin is stable. Patient with no shortness of breath.  X-ray shows mild interstitial thickening prior to similar radiographs.  EF was normal on last echo.  She is given a dose of IV Lasix.  She is able to ambulate without desaturation.  Patient will be referred to urology for follow-up of her likely hemorrhagic cystitis.  He is able to void on her own.  Urine culture is sent.  Patient did have an episode of urination in the ED that was clear and nonbloody.  It is sent for culture.  She is given a dose of Rocephin.  She will given Keflex for home and likely hemorrhagic cystitis.  Follow-up with her PCP as well as referral to urology.  With her ongoing headache and facial pain with eliquis use, will obtain CT head to ensure that there is nohemorrhage before discharge. This was negative for ICH. Does show chronic infarcts.   Followup with PCP and urology. Return precautions discussed.    ED ECG REPORT   Date: 06/05/2019  Rate: 76  Rhythm: atrial fibrillation  QRS Axis: normal  Intervals: normal  ST/T Wave abnormalities: nonspecific ST changes  Conduction Disutrbances:none  Narrative Interpretation:   Old EKG Reviewed: unchanged  I have personally reviewed the EKG tracing and agree with the computerized printout as noted.  Final Clinical Impression(s) / ED Diagnoses Final diagnoses:  Hematuria, unspecified type    Rx / DC Orders ED Discharge Orders    None       Scarlette Hogston, Annie Main, MD 06/05/19 1530    Ezequiel Essex, MD 06/05/19 1811

## 2019-06-07 LAB — URINE CULTURE: Culture: >=100000 — AB

## 2019-06-08 ENCOUNTER — Ambulatory Visit (INDEPENDENT_AMBULATORY_CARE_PROVIDER_SITE_OTHER): Payer: PPO | Admitting: Adult Health

## 2019-06-08 ENCOUNTER — Other Ambulatory Visit: Payer: Self-pay

## 2019-06-08 ENCOUNTER — Telehealth: Payer: Self-pay | Admitting: Emergency Medicine

## 2019-06-08 ENCOUNTER — Encounter: Payer: Self-pay | Admitting: Adult Health

## 2019-06-08 VITALS — BP 128/70 | HR 53 | Temp 97.3°F | Wt 105.0 lb

## 2019-06-08 DIAGNOSIS — I4891 Unspecified atrial fibrillation: Secondary | ICD-10-CM | POA: Diagnosis not present

## 2019-06-08 DIAGNOSIS — N3001 Acute cystitis with hematuria: Secondary | ICD-10-CM

## 2019-06-08 DIAGNOSIS — I10 Essential (primary) hypertension: Secondary | ICD-10-CM

## 2019-06-08 MED ORDER — ATENOLOL 100 MG PO TABS: 50.0000 mg | ORAL_TABLET | Freq: Two times a day (BID) | ORAL | 1 refills | Status: DC

## 2019-06-08 NOTE — Patient Instructions (Addendum)
It looks like you are overdue to follow up with cardiology - please call to ask and schedule if they recommend this [phone]  Recent CT showed some signs of possible heart failure - looks like they wanted to repeat an echocardiogram - ask if this needs to be scheduled      Otherwise please continue medications as you are taking them  Finish the antibiotic, push water intake  Monitor for any blood in your urine - will recheck at the next office visit  Can defer on urology as long as blood in urine remains resolved (likely caused by infection)       Hematuria, Adult Hematuria is blood in the urine. Blood may be visible in the urine, or it may be identified with a test. This condition can be caused by infections of the bladder, urethra, kidney, or prostate. Other possible causes include:  Kidney stones.  Cancer of the urinary tract.  Too much calcium in the urine.  Conditions that are passed from parent to child (inherited conditions).  Exercise that requires a lot of energy. Infections can usually be treated with medicine, and a kidney stone usually will pass through your urine. If neither of these is the cause of your hematuria, more tests may be needed to identify the cause of your symptoms. It is very important to tell your health care provider about any blood in your urine, even if it is painless or the blood stops without treatment. Blood in the urine, when it happens and then stops and then happens again, can be a symptom of a very serious condition, including cancer. There is no pain in the initial stages of many urinary cancers. Follow these instructions at home: Medicines  Take over-the-counter and prescription medicines only as told by your health care provider.  If you were prescribed an antibiotic medicine, take it as told by your health care provider. Do not stop taking the antibiotic even if you start to feel better. Eating and drinking  Drink enough  fluid to keep your urine clear or pale yellow. It is recommended that you drink 3-4 quarts (2.8-3.8 L) a day. If you have been diagnosed with an infection, it is recommended that you drink cranberry juice in addition to large amounts of water.  Avoid caffeine, tea, and carbonated beverages. These tend to irritate the bladder.  Avoid alcohol because it may irritate the prostate (men). General instructions  If you have been diagnosed with a kidney stone, follow your health care provider's instructions about straining your urine to catch the stone.  Empty your bladder often. Avoid holding urine for long periods of time.  If you are female: ? After a bowel movement, wipe from front to back and use each piece of toilet paper only once. ? Empty your bladder before and after sex.  Pay attention to any changes in your symptoms. Tell your health care provider about any changes or any new symptoms.  It is your responsibility to get your test results. Ask your health care provider, or the department performing the test, when your results will be ready.  Keep all follow-up visits as told by your health care provider. This is important. Contact a health care provider if:  You develop back pain.  You have a fever.  You have nausea or vomiting.  Your symptoms do not improve after 3 days.  Your symptoms get worse. Get help right away if:  You develop severe vomiting and are unable take medicine  without vomiting.  You develop severe pain in your back or abdomen even though you are taking medicine.  You pass a large amount of blood in your urine.  You pass blood clots in your urine.  You feel very weak or like you might faint.  You faint. Summary  Hematuria is blood in the urine. It has many possible causes.  It is very important that you tell your health care provider about any blood in your urine, even if it is painless or the blood stops without treatment.  Take over-the-counter  and prescription medicines only as told by your health care provider.  Drink enough fluid to keep your urine clear or pale yellow. This information is not intended to replace advice given to you by your health care provider. Make sure you discuss any questions you have with your health care provider. Document Released: 06/11/2005 Document Revised: 05/24/2017 Document Reviewed: 07/14/2016 Elsevier Patient Education  2020 [employer].

## 2019-06-08 NOTE — Progress Notes (Signed)
Assessment and Plan:  Debra White was seen today for follow-up.  Diagnoses and all orders for this visit:  Acute cystitis with hematuria No further episodes of hematuria with UTI tx Complete keflex Keep follow up as scheduled on 06/16/2019 - recheck UA/culture if indicated for resolution Push fluids Continue low dose elequis due to bleeding complications Discussed but will defer urology referral at this time, norma CT, will monitor for recurrence here in office per patient preference; Dr. [name] in agreement with plan   Essential hypertension Continue current medications Monitor blood pressure at home; call if consistently over 130/80 Continue DASH diet.   Reminder to go to the ER if any CP, SOB, nausea, dizziness, severe HA, changes vision/speech, left arm numbness and tingling and jaw pain.  Atrial fibrillation with controlled ventricular response (HCC) Continue elequis 2.5 mg BID for a. Fib and splenic/renal infarcts; low dose ? D/t bleeding complications Rate controlled today;  She does not have cardiology follow up; was due for 3 month OV in Aug/Sept 2020 - phone number provided and encouraged to schedule follow up as recommended  Mild interstitial pulmonary edema/ small volume ascites Noted on imaging; appears euvolemic today Had normal ECHO 02/2018 Plan for follow up study but deferred in light of pandemic In light of normal exam today, have encouraged her to schedule future non-urgent follow up with cardiology, will likely get follow up study next year Reviewed symptoms of fluid overload with patient - she will follow up with [place] or cardiology if any concerns; keep close follow up next week as scheduled  Further disposition pending results of labs. Discussed med's effects and SE's.   Over 30 minutes of exam, counseling, chart review, and critical decision making was performed.   Future Appointments  Date Time Provider [place]  06/16/2019 10:00 AM Garnet Sierras, NP  [facility] None  10/06/2019  9:30 AM Vicie Mutters, PA-C [facility] None  12/15/2019  9:00 AM Unk Pinto, MD [facility] None    ------------------------------------------------------------------------------------------------------------------   HPI BP 128/70   Pulse (!) 53   Temp (!) 97.3 F (36.3 C)   Wt 105 lb (47.6 kg)   SpO2 97%   BMI 18.31 kg/m   85 y.o.female with a. Fib, hx kidney stones and recent hx of splenic and renal infarct in 02/2018 now on elequis (taking 2.5 mg BID due to rectal bleeding per patient) presents for 3 day follow up on ED visit for hematuria on 06/05/2019.   She reports had been having dysuria, frequency, and started having frank hematuria and presented to ED for evaluation due to elequis use and concern due to bleeding. She denies fever. UA showed hematuria without obvious infection. Cultures were sent and ultimately did grow proteus mirabilis colonies, sensitive to all agents excepting nitrofurantoin.   CT abdomen was unremarkable for hematuria workup - no calculi or urinary tract obstruction. She was felt to have small volume ascites suspect for CHF (no hx, had ECHO 02/2018 without evidence for CHF, maintained EF, notably it does appear there were plans to repeat ECHO this year, last saw cardiology via telehealth appointment on 11/11/2018, needed 3 month follow up but this was missed). She endorses some ongoing fatigue, reports mild ankle edema consistent with baseline (wears compression hose for many years), denies CP, dyspnea, cough, PND. She was given a dose of lasix and was able to ambulate without desaturation.   She was given a dose of rocephin in ED and discharged on keflex. She reports urinary symptoms are resolved, no  episodes of hematuria after presenting to ED. She was dx with hemorrhagic cystis and advised to follow up with PCP here and also discuss urology referral. She continues with keflex 500 mg BID. We discussed this at length today; she  denies history of previous hematuria, problems with recent recurrent UTI or other urgent urological concerns. She would prefer to defer referral to another specialist at this time. Discussed with Dr. [name] who is in agreement, reasonable to defer unless recurrence.    Past Medical History:  Diagnosis Date  . Anxiety   . Arthritis   . Blood transfusion   . C. difficile colitis   . Candida esophagitis ([place]) 2013   EGD  . Cataract   . Chronic kidney disease    STONES  . Diverticulitis 04/05/2017  . Diverticulosis of colon (without mention of hemorrhage) 2007   Colonoscopy  . Dysrhythmia    RX  . Emphysema of lung ([place])   . Family history of colon cancer    sister  . Fibromyalgia   . Fracture, zygoma closed ([place]) 07/13/2011  . GERD (gastroesophageal reflux disease)   . Headache(784.0)   . Hyperlipemia   . Hypertension   . Kidney stones   . Ptosis, bilateral   . Renal infarct ([place])   . Splenic infarct      Allergies  Allergen Reactions  . Latex Hives    TONGUE HAD BLISTERS WHEN HAD DENTAL SURGERY  . Amoxicillin Nausea And Vomiting  . Aspirin Other (See Comments)    Gi upset  . Barbiturates Other (See Comments)    Unknown reaction  . Celebrex [Celecoxib] Other (See Comments)    Unknown reaction  . Codeine Itching  . Evista [Raloxifene] Other (See Comments)    Unknown reaction  . Lipitor [Atorvastatin] Other (See Comments)    Unknown reaction  . Morphine And Related Other (See Comments)    Unknown reaction  . Oruvail [Ketoprofen] Other (See Comments)    Unknown reaction  . Pantoprazole Diarrhea  . Paraffin Other (See Comments)    Unknown reaction  . Prochlorperazine Other (See Comments)    Uncontrolled shaking  . Proloprim [Trimethoprim] Other (See Comments)    Unknown reaction  . Venlafaxine Nausea Only  . Vibra-Tab [Doxycycline] Other (See Comments)    Unknown reaction  . Vioxx [Rofecoxib] Other (See Comments)    edema  . Betadine [Povidone Iodine] Rash   . Caffeine Palpitations    Current Outpatient Medications on File Prior to Visit  Medication Sig  . acetaminophen (TYLENOL) 500 MG tablet Take 1,000 mg by mouth at bedtime.   Marland Kitchen apixaban (ELIQUIS) 5 MG TABS tablet Take 1 tablet 2 x /day to Prevent Blood Clots (Patient taking differently: Take 2.5 mg by mouth 2 (two) times daily. )  . atenolol (TENORMIN) 100 MG tablet TAKE 1 TABLET BY MOUTH DAILY FOR BLOOD PRESSURE (Patient taking differently: Take 50 mg by mouth 2 (two) times daily. )  . azithromycin (ZITHROMAX) 250 MG tablet Take 2 tablets with Food on  Day 1, then 1 tablet Daily with Food for Infection  . cephALEXin (KEFLEX) 500 MG capsule Take 1 capsule (500 mg total) by mouth 2 (two) times daily.  . Cholecalciferol (VITAMIN D) 2000 units tablet Take 4,000 Units by mouth daily.   . clonazePAM (KLONOPIN) 1 MG tablet Take 1/2-1 tablet 2 - 3 x /day ONLY if needed for Anxiety Attack &  limit to 5 days /week to avoid addiction (Patient taking differently: Take 0.5 mg  by mouth 2 (two) times daily. )  . diclofenac sodium (VOLTAREN) 1 % GEL Apply 2 g topically 4 (four) times daily. (Patient taking differently: Apply 2 g topically 4 (four) times daily as needed (pain). )  . dicyclomine (BENTYL) 10 MG capsule Take 1 capsule (10 mg total) by mouth 3 (three) times daily before meals.  . digoxin (LANOXIN) 0.125 MG tablet Take 1 tablet Daily for fast / Irregular Heart Beat (Patient taking differently: Take 0.125 mg by mouth daily. )  . furosemide (LASIX) 40 MG tablet Take 1/2 to 1 tablet Daily for Fluid Retention & Ankle Swelling  . gabapentin (NEURONTIN) 600 MG tablet Take 1/2 to 1 tablet s 3 to 4 x / day as needed for Facial Pain (Patient taking differently: Take 300 mg by mouth 3 (three) times daily. )  . hyoscyamine (LEVSIN SL) 0.125 MG SL tablet DISSOLVE 1 TABLET UNDER THE TONGUE EVERY4 HOURS AS NEEDED FOR NAUSEA,CRAMPING, BLOATING OR DIARRHEA (Patient taking differently: Take 0.125 mg by mouth every 6  (six) hours as needed for cramping. )  . lovastatin (MEVACOR) 20 MG tablet Take 1 tablet at Bedtime for Cholesterol (Patient taking differently: Take 20 mg by mouth at bedtime. )  . olmesartan (BENICAR) 40 MG tablet Take 1/2 to 1 tablet daily for BP (Patient taking differently: Take 20 mg by mouth every evening. )  . omeprazole (PRILOSEC) 40 MG capsule TAKE 1 CAPSULE BY MOUTH DAILY BEFORE BREAKFAST (Patient taking differently: Take 40 mg by mouth daily after breakfast. )  . ondansetron (ZOFRAN) 8 MG tablet Take 1 tablet 3 x  /day if needed for Nausea  . [DISCONTINUED] omeprazole (PRILOSEC OTC) 20 MG tablet Take 20 mg by mouth daily.   No current facility-administered medications on file prior to visit.    ROS: all negative except above.   Physical Exam:  BP 128/70   Pulse (!) 53   Temp (!) 97.3 F (36.3 C)   Wt 105 lb (47.6 kg)   SpO2 97%   BMI 18.31 kg/m   General Appearance: Well dressed thin elder, in no apparent distress. Eyes: PERRLA, EOMs, conjunctiva no swelling or erythema ENT/Mouth:  Mildly HOH with mask use.  Neck: Supple Respiratory: Respiratory effort normal, BS equal bilaterally without rales, rhonchi, wheezing or stridor.  Cardio: RRR with no MRGs. Intact peripheral pulses bilaterally; no significant edema or ankles; bilateral compression hose are present Abdomen: Soft, + BS.  Non tender, no guarding, rebound, hernias, masses. Lymphatics: Non tender without lymphadenopathy.  Musculoskeletal: Full ROM, 5/5 strength, slow steady gait.  Skin: Warm, dry without rashes, lesions, ecchymosis.  Neuro: Cranial nerves intact. Normal muscle tone, no cerebellar symptoms. Sensation intact.  Psych: Awake and oriented X 3, normal affect, Insight and Judgment appropriate.     Izora Ribas, NP 11:48 AM [facility]

## 2019-06-08 NOTE — Telephone Encounter (Signed)
Post ED Visit - Positive Culture Follow-up  Culture report reviewed by antimicrobial stewardship pharmacist: [place] Team []  [name], Pharm.D. []  Heide Guile, Pharm.D., BCPS AQ-ID []  Parks Neptune, Pharm.D., BCPS []  [name], Pharm.D., BCPS []  [place], Pharm.D., BCPS, AAHIVP []  Legrand Como, Pharm.D., BCPS, AAHIVP []  Salome Arnt, PharmD, BCPS []  Johnnette Gourd, PharmD, BCPS []  Hughes Better, PharmD, BCPS []  Leeroy Cha, PharmD []  Laqueta Linden, PharmD, BCPS []  Albertina Parr, PharmD  [place] Team []  Leodis Sias, PharmD []  Lindell Spar, PharmD [x]  Royetta Asal, PharmD []  Graylin Shiver, Rph []  Rema Fendt) Glennon Mac, PharmD []  Arlyn Dunning, PharmD []  Netta Cedars, PharmD []  Dia Sitter, PharmD []  Leone Haven, PharmD []  Gretta Arab, PharmD []  Theodis Shove, PharmD []  Peggyann Juba, PharmD []  Reuel Boom, PharmD   Positive urine culture Treated with cephalexin, organism sensitive to the same and no further patient follow-up is required at this time.  Hazle Nordmann 06/08/2019, 2:22 PM

## 2019-06-15 ENCOUNTER — Other Ambulatory Visit: Payer: Self-pay | Admitting: Adult Health

## 2019-06-15 ENCOUNTER — Ambulatory Visit: Payer: PPO | Admitting: Adult Health

## 2019-06-15 NOTE — Progress Notes (Signed)
MEDICARE ANNUAL WELLNESS VISIT AND 3 MONTH FOLLOW UP  Assessment:   Debra White was seen today for medicare wellness and follow-up.  Diagnoses and all orders for this visit:  Essential hypertension Continue current medications: Olmesartan, lasix Monitor blood pressure at home; call if consistently over 130/80 Continue DASH diet.   Reminder to go to the ER if any CP, SOB, nausea, dizziness, severe HA, changes vision/speech, left arm numbness and tingling and jaw pain. -     Urinalysis w microscopic + reflex cultur -     CBC with Diff -     COMPLETE METABOLIC PANEL WITH GFR -     TSH  Mixed hyperlipidemia Continue medications:lovastatin 20mg  Discussed dietary and exercise modifications Low fat diet -     Lipid Profile  Abnormal glucose Discussed dietary and exercise modifications -     Hemoglobin A1c   Vitamin D deficiency Continue supplementation Taking Vitamin D 4,000 IU daily  Atrial fibrillation with controlled ventricular response (HCC) Rate controlled  Taking digoxin, atenolol No concerns with excessive bleeding  No falls Continue medication: Eliquis 5mg  BID Discussed hospital precautions with patient Will continue to monitor  Aortic atherosclerosis (HCC) Control blood pressure, lipids and glucose Disscused lifestyle modifications, diet & exercise Continue to monitor  Pulmonary emphysema, unspecified emphysema type (HCC) No medications Continue to monitor  Mild malnutrition (HCC) Discussed dietary intake Increase food high in protein Continue Boost  Gastroesophageal reflux disease, unspecified whether esophagitis present Doing well at this time Continue:  Diet discussed Monitor for triggers Avoid food with high acid content Avoid excessive cafeine Increase water intake -     Magnesium  Tortuous aorta (HCC) Control blood pressure, lipids and glucose Disscused lifestyle modifications, diet & exercise Continue to monitor  Renal infarct [facility]) -     Following with Cardiology - continue 5 mg Eliquis BID   -Monitor for bleeding  Otalgia, left -     Ear Lavage Canals clear Discussed ear hygiene  Dysuria Will check UA has complete antibiotics Continue to monitor for hematuria Urology referral, discussed with patient, defers today. No further symptoms -     REFLEXIVE URINE CULTURE   Call or return with new or worsening symptoms as discussed in appointment.  May contact via office phone [phone]  via [place].  Over 40 minutes of exam, counseling, chart review and critical decision making was performed Future Appointments  Date Time Provider [place]  06/29/2019  9:00 AM Erlene Quan, PA-C [facility]  10/06/2019  9:30 AM Vicie Mutters, PA-C [facility] None  12/15/2019  9:00 AM Unk Pinto, MD [facility] None  06/23/2020 10:30 AM Garnet Sierras, NP [facility] None     Plan:   During the course of the visit the patient was educated and counseled about appropriate screening and preventive services including:    Pneumococcal vaccine   Prevnar 13  Influenza vaccine  Td vaccine  Screening electrocardiogram  Bone densitometry screening  Colorectal cancer screening  Diabetes screening  Glaucoma screening  Nutrition counseling   Advanced directives: requested   Subjective:  Debra White is a 85 y.o. female who presents for Medicare Annual Wellness Vist and 3 Month follow up. She had splenic and renal infarcts and is taking Eliquis for this.  She worse a Holter moitor but was unable to tolerate this.  She will have TEE to assesses for PFO.  This is scheduled for 07/01/2018.  She had ED visit for dysuria and hematuria 06/05/19.  She was given IM rocephin  and Rx for keflex.  She has finished the antibiotic course and reports she has residual burning.  She has increased her water intake to help with this.  Reports no further hematuria noted.  She also has history of kidney  stones.   She has had elevated blood pressure for over 30 years. Her blood pressure has been controlled at home, today their BP is BP: (!) 144/72 She does not workout. She denies chest pain, shortness of breath, dizziness.  She is not on cholesterol medication at this time.  She was experiencing myalgias.  Cardiology advised her to stop and managing this restart.  Her cholesterol is not at goal. The cholesterol last visit was:   Lab Results  Component Value Date   CHOL 150 03/12/2019   HDL 62 03/12/2019   LDLCALC 64 03/12/2019   TRIG 160 (H) 03/12/2019   CHOLHDL 2.4 03/12/2019   She has prediabetes.  has not been working on diet and exercise for prediabetes, and denies hyperglycemia, hypoglycemia , nausea, polydipsia, polyuria and visual disturbances. Last A1C in the office was:  Lab Results  Component Value Date   HGBA1C 5.8 (H) 03/12/2019   Last GFR:   Lab Results  Component Value Date   GFRNONAA >60 06/05/2019   Lab Results  Component Value Date   GFRAA >60 06/05/2019   Patient is on Vitamin D supplement.   Lab Results  Component Value Date   VD25OH 61 03/12/2019      Medication Review: Current Outpatient Medications on File Prior to Visit  Medication Sig Dispense Refill  . acetaminophen (TYLENOL) 500 MG tablet Take 1,000 mg by mouth at bedtime.     Marland Kitchen apixaban (ELIQUIS) 5 MG TABS tablet Take 1 tablet 2 x /day to Prevent Blood Clots (Patient taking differently: Take 2.5 mg by mouth 2 (two) times daily. ) 60 tablet 2  . atenolol (TENORMIN) 100 MG tablet Take 0.5 tablets (50 mg total) by mouth 2 (two) times daily. 90 tablet 1  . Cholecalciferol (VITAMIN D) 2000 units tablet Take 4,000 Units by mouth daily.     . clonazePAM (KLONOPIN) 1 MG tablet Take 1/2-1 tablet 2 - 3 x /day ONLY if needed for Anxiety Attack &  limit to 5 days /week to avoid addiction (Patient taking differently: Take 0.5 mg by mouth 2 (two) times daily. ) 90 tablet 0  . diclofenac sodium (VOLTAREN) 1 %  GEL Apply 2 g topically 4 (four) times daily. (Patient taking differently: Apply 2 g topically 4 (four) times daily as needed (pain). ) 500 g 2  . dicyclomine (BENTYL) 10 MG capsule Take 1 capsule (10 mg total) by mouth 3 (three) times daily before meals. 90 capsule 11  . digoxin (LANOXIN) 0.125 MG tablet Take 1 tablet Daily for fast / Irregular Heart Beat (Patient taking differently: Take 0.125 mg by mouth daily. ) 100 tablet 3  . furosemide (LASIX) 40 MG tablet Take 1/2 to 1 tablet Daily for Fluid Retention & Ankle Swelling 90 tablet 0  . lovastatin (MEVACOR) 20 MG tablet Take 1 tablet at Bedtime for Cholesterol (Patient taking differently: Take 20 mg by mouth at bedtime. ) 90 tablet 3  . olmesartan (BENICAR) 40 MG tablet Take 1/2 to 1 tablet daily for BP (Patient taking differently: Take 20 mg by mouth every evening. ) 90 tablet 3  . omeprazole (PRILOSEC) 40 MG capsule TAKE 1 CAPSULE BY MOUTH DAILY BEFORE BREAKFAST (Patient taking differently: Take 40 mg by mouth daily after  breakfast. ) 90 capsule 1  . ondansetron (ZOFRAN) 8 MG tablet Take 1 tablet 3 x  /day if needed for Nausea 90 tablet 0  . cephALEXin (KEFLEX) 500 MG capsule Take 1 capsule (500 mg total) by mouth 2 (two) times daily. 20 capsule 0  . dicyclomine (BENTYL) 20 MG tablet Take 1 tablet 4 x /day before Meals & Bedtime if needed for Nausea, Cramping, Bloating or Diarrhea 120 tablet 0  . gabapentin (NEURONTIN) 600 MG tablet Take 1/2 to 1 tablet s 3 to 4 x / day as needed for Facial Pain (Patient taking differently: Take 300 mg by mouth 3 (three) times daily. ) 360 tablet 3  . [DISCONTINUED] omeprazole (PRILOSEC OTC) 20 MG tablet Take 20 mg by mouth daily.     No current facility-administered medications on file prior to visit.    Allergies  Allergen Reactions  . Latex Hives    TONGUE HAD BLISTERS WHEN HAD DENTAL SURGERY  . Amoxicillin Nausea And Vomiting  . Aspirin Other (See Comments)    Gi upset  . Barbiturates Other (See  Comments)    Unknown reaction  . Celebrex [Celecoxib] Other (See Comments)    Unknown reaction  . Codeine Itching  . Evista [Raloxifene] Other (See Comments)    Unknown reaction  . Lipitor [Atorvastatin] Other (See Comments)    Unknown reaction  . Morphine And Related Other (See Comments)    Unknown reaction  . Oruvail [Ketoprofen] Other (See Comments)    Unknown reaction  . Pantoprazole Diarrhea  . Paraffin Other (See Comments)    Unknown reaction  . Prochlorperazine Other (See Comments)    Uncontrolled shaking  . Proloprim [Trimethoprim] Other (See Comments)    Unknown reaction  . Venlafaxine Nausea Only  . Vibra-Tab [Doxycycline] Other (See Comments)    Unknown reaction  . Vioxx [Rofecoxib] Other (See Comments)    edema  . Betadine [Povidone Iodine] Rash  . Caffeine Palpitations    Current Problems (verified) Patient Active Problem List   Diagnosis Date Noted  . Atrial fibrillation with controlled ventricular response ([place]) 03/12/2019  . Coronary atherosclerosis due to lipid rich plaque 03/26/2018  . Splenic infarct 03/22/2018  . Renal infarct ([place]) 03/22/2018  . Chronic hyponatremia 03/22/2018  . Chronic hyperkalemia 03/22/2018  . Hepatic steatosis 11/17/2017  . Aortic atherosclerosis ([place]) 08/08/2017  . COPD (chronic obstructive pulmonary disease) with emphysema ([place]) 12/21/2015  . 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 Osteoporosis 12/21/2015  . Mild malnutrition ([place]) 05/11/2015  . Encounter for Medicare annual wellness exam 04/08/2015  . Medication management 01/27/2014  . 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 Vitamin D deficiency 07/08/2013  . Hypertension   . Hyperlipidemia   . GERD   . Headache, post-traumatic, chronic 09/20/2011    Screening Tests Immunization History  Administered Date(s) Administered  . Influenza, High Dose Seasonal PF 03/25/2014, 04/05/2016, 06/02/2018, 03/12/2019  . Influenza-Unspecified 03/09/2013, 03/11/2015  . Pneumococcal  Conjugate-13 12/21/2015  . Pneumococcal Polysaccharide-23 06/25/2002  . Td 06/26/2003    Preventative care: Last colonoscopy: 2013 Last mammogram: 08/2018 DEXA: 2017  Prior vaccinations: TD or Tdap: 2015, DUE Declines related to previous reaction?  Discussed with patient  Influenza: 2020  Pneumococcal: 2004 Prevnar13: 2017 Shingles/Zostavax: N/A  Names of Other Physician/Practitioners you currently use: 1. [facility] here for primary care 2.  Eye Exam 2019 3. Dentist Feb 2020  Patient Care Team: Unk Pinto, MD as PCP - General (Internal Medicine) Skeet Latch, MD as  PCP - Cardiology (Cardiology) Darleen Crocker, MD as Consulting Physician (Ophthalmology) Ladene Artist, MD as Consulting Physician (Gastroenterology) Cameron Sprang, MD as Consulting Physician (Neurology) Jodi Marble, MD as Consulting Physician (Otolaryngology)  SURGICAL HISTORY She  has a past surgical history that includes Abdominal hysterectomy; Hand surgery; Shoulder arthroscopy w/ rotator cuff repair; Cervical disc surgery; Back surgery; ORIF tripod fracture (07/13/2011); Colonoscopy; Facial fracture surgery; Back surgery; Ptosis repair (Bilateral, 11/22/2016); and Breast excisional biopsy. FAMILY HISTORY Her family history includes Breast cancer (age of onset: 80) in her sister; Cancer in her sister, sister, and sister; Colon cancer in her sister; Heart attack in her father; Heart disease in her father, mother, and sister; Hyperlipidemia in her daughter; Hypertension in her daughter. SOCIAL HISTORY She  reports that she has never smoked. She has never used smokeless tobacco. She reports that she does not drink alcohol or use drugs.   MEDICARE WELLNESS OBJECTIVES: Physical activity: Current Exercise Habits: The patient does not participate in regular exercise at present, Exercise limited by: orthopedic condition(s);cardiac condition(s) Cardiac risk factors:  Cardiac Risk Factors include: advanced age (>64men, >29 women);dyslipidemia;hypertension Depression/mood screen:   Depression screen [facility] 2/9 06/16/2019  Decreased Interest 0  Down, Depressed, Hopeless 0  PHQ - 2 Score 0  Altered sleeping -  Tired, decreased energy -  Change in appetite -  Feeling bad or failure about yourself  -  Trouble concentrating -  Moving slowly or fidgety/restless -  Suicidal thoughts -  PHQ-9 Score -  Some recent data might be hidden    ADLs:  In your present state of health, do you have any difficulty performing the following activities: 06/16/2019 05/04/2019  Hearing? N (No Data)  Comment - hearing aids  Vision? N N  Difficulty concentrating or making decisions? N Y  Comment - -  Walking or climbing stairs? N N  Dressing or bathing? N N  Doing errands, shopping? N N  Preparing Food and eating ? N -  Using the Toilet? N -  In the past six months, have you accidently leaked urine? N -  Do you have problems with loss of bowel control? N -  Managing your Medications? N -  Managing your Finances? N -  Housekeeping or managing your Housekeeping? N -  Some recent data might be hidden     Cognitive Testing  Alert? Yes  Normal Appearance?Yes  Oriented to person? Yes  Place? Yes   Time? Yes  Recall of three objects?  Yes  Can perform simple calculations? Yes  Displays appropriate judgment?Yes  Can read the correct time from a watch face?Yes  EOL planning: Does Patient Have a Medical Advance Directive?: Yes Type of Advance Directive: Living will Does patient want to make changes to medical advance directive?: No - Patient declined Would patient like information on creating a medical advance directive?: No - Patient declined  Review of Systems  Constitutional: Negative for chills, diaphoresis, fever, malaise/fatigue and weight loss.  HENT: Negative for congestion, ear discharge, ear pain, hearing loss, nosebleeds, sinus pain, sore throat and tinnitus.    Eyes: Negative for blurred vision, double vision, photophobia, pain, discharge and redness.  Respiratory: Negative for cough, hemoptysis, sputum production, shortness of breath, wheezing and stridor.   Cardiovascular: Negative for chest pain, palpitations, orthopnea, claudication, leg swelling and PND.  Gastrointestinal: Negative for abdominal pain, blood in stool, constipation, diarrhea, heartburn, melena, nausea and vomiting.  Genitourinary: Negative for dysuria, flank pain, frequency, hematuria and urgency.  Musculoskeletal: Negative  for back pain, falls, joint pain, myalgias and neck pain.  Skin: Negative for itching and rash.  Neurological: Negative for dizziness, tingling, tremors, sensory change, speech change, focal weakness, seizures, loss of consciousness, weakness and headaches.  Endo/Heme/Allergies: Negative for environmental allergies and polydipsia. Does not bruise/bleed easily.  Psychiatric/Behavioral: Negative for depression, hallucinations, memory loss, substance abuse and suicidal ideas. The patient is not nervous/anxious and does not have insomnia.      Objective:     Today's Vitals   06/16/19 1001  BP: (!) 144/72  Pulse: 60  Resp: 16  Temp: (!) 97 F (36.1 C)  Weight: 106 lb 3.2 oz (48.2 kg)  Height: 5' 3.5" (1.613 m)   Body mass index is 18.52 kg/m.  General appearance: alert, no distress, WD/WN, female HEENT: normocephalic, sclerae anicteric, TMs pearly right, left cerumen impaction, nares patent, no discharge or erythema, pharynx normal Oral cavity: MMM, no lesions Neck: supple, no lymphadenopathy, no thyromegaly, no masses Heart: RRR, normal S1, S2, no murmurs Lungs: CTA bilaterally, no wheezes, rhonchi, or rales Abdomen: +bs, soft, non tender, non distended, no masses, no hepatomegaly, no splenomegaly Musculoskeletal: nontender, no swelling, no obvious deformity Extremities: no edema, no cyanosis, no clubbing Pulses: 2+ symmetric, upper and lower  extremities, normal cap refill Neurological: alert, oriented x 3, CN2-12 intact, strength normal upper extremities and lower extremities, sensation normal throughout, DTRs 2+ throughout, no cerebellar signs, gait normal Psychiatric: normal affect, behavior normal, pleasant   Medicare Attestation I have personally reviewed: The patient's medical and social history Their use of alcohol, tobacco or illicit drugs Their current medications and supplements The patient's functional ability including ADLs,fall risks, home safety risks, cognitive, and hearing and visual impairment Diet and physical activities Evidence for depression or mood disorders  The patient's weight, height, BMI, and visual acuity have been recorded in the chart.  I have made referrals, counseling, and provided education to the patient based on review of the above and I have provided the patient with a written personalized care plan for preventive services.     Garnet Sierras, NP   06/16/2019

## 2019-06-16 ENCOUNTER — Other Ambulatory Visit: Payer: Self-pay

## 2019-06-16 ENCOUNTER — Ambulatory Visit (INDEPENDENT_AMBULATORY_CARE_PROVIDER_SITE_OTHER): Payer: PPO | Admitting: Adult Health Nurse Practitioner

## 2019-06-16 ENCOUNTER — Encounter: Payer: Self-pay | Admitting: Adult Health Nurse Practitioner

## 2019-06-16 VITALS — BP 144/72 | HR 60 | Temp 97.0°F | Resp 16 | Ht 63.5 in | Wt 106.2 lb

## 2019-06-16 DIAGNOSIS — I4891 Unspecified atrial fibrillation: Secondary | ICD-10-CM | POA: Diagnosis not present

## 2019-06-16 DIAGNOSIS — I700 Atherosclerosis of aorta: Secondary | ICD-10-CM | POA: Diagnosis not present

## 2019-06-16 DIAGNOSIS — J439 Emphysema, unspecified: Secondary | ICD-10-CM

## 2019-06-16 DIAGNOSIS — I10 Essential (primary) hypertension: Secondary | ICD-10-CM

## 2019-06-16 DIAGNOSIS — H9202 Otalgia, left ear: Secondary | ICD-10-CM | POA: Diagnosis not present

## 2019-06-16 DIAGNOSIS — I771 Stricture of artery: Secondary | ICD-10-CM

## 2019-06-16 DIAGNOSIS — R6889 Other general symptoms and signs: Secondary | ICD-10-CM | POA: Diagnosis not present

## 2019-06-16 DIAGNOSIS — E559 Vitamin D deficiency, unspecified: Secondary | ICD-10-CM

## 2019-06-16 DIAGNOSIS — R7309 Other abnormal glucose: Secondary | ICD-10-CM

## 2019-06-16 DIAGNOSIS — E782 Mixed hyperlipidemia: Secondary | ICD-10-CM | POA: Diagnosis not present

## 2019-06-16 DIAGNOSIS — N280 Ischemia and infarction of kidney: Secondary | ICD-10-CM

## 2019-06-16 DIAGNOSIS — E441 Mild protein-calorie malnutrition: Secondary | ICD-10-CM

## 2019-06-16 DIAGNOSIS — R300 Dysuria: Secondary | ICD-10-CM

## 2019-06-16 DIAGNOSIS — Z0001 Encounter for general adult medical examination with abnormal findings: Secondary | ICD-10-CM | POA: Diagnosis not present

## 2019-06-16 DIAGNOSIS — K219 Gastro-esophageal reflux disease without esophagitis: Secondary | ICD-10-CM

## 2019-06-16 NOTE — Patient Instructions (Signed)
You may use small cap full of room temperature peroxide into both ears couple times a month.  This will help to clear any wax.  You can also use 3-4 drops of mineral or Olive oil in each ear at night.  Place a cotton ball outside of ear to keep fluid in ear canal.  This will help to soften the wax.  Do this for 3 nights then use warm water from the shower on the the third morning.   We will call you in 1-3 days with your lab results.     Vit D  & Vit C 1,000 mg   are recommended to help protect  against the Covid-19 and other Corona viruses.    Also it's recommended  to take  Zinc 50 mg  to help  protect against the Covid-19   and best place to get  is also on [employer].com  and don't pay more than 6-8 cents /pill !  ================================ Coronavirus (COVID-19) Are you at risk?  Are you at risk for the Coronavirus (COVID-19)?  To be considered HIGH RISK for Coronavirus (COVID-19), you have to meet the following criteria:  . Traveled to [place], [place], [place], [place] or [place]; or in the [place] to [place], [place], [place]  . or [place]; and have fever, cough, and shortness of breath within the last 2 weeks of travel OR . Been in close contact with a person diagnosed with COVID-19 within the last 2 weeks and have  . fever, cough,and shortness of breath .  . IF YOU DO NOT MEET THESE CRITERIA, YOU ARE CONSIDERED LOW RISK FOR COVID-19.  What to do if you are HIGH RISK for COVID-19?  Marland Kitchen If you are having a medical emergency, call 911. . Seek medical care right away. Before you go to a doctor's office, urgent care or emergency department, .  call ahead and tell them about your recent travel, contact with someone diagnosed with COVID-19  .  and your symptoms.  . You should receive instructions from your physician's office regarding next steps of care.  . When you arrive at healthcare provider, tell the healthcare staff immediately you have returned from   . visiting [place], [place], [place], [place] or [place]; or traveled in the [place] to [place], [place],  . [place] or [place] in the last two weeks or you have been in close contact with a person diagnosed with  . COVID-19 in the last 2 weeks.   . Tell the health care staff about your symptoms: fever, cough and shortness of breath. . After you have been seen by a medical provider, you will be either: o Tested for (COVID-19) and discharged home on quarantine except to seek medical care if  o symptoms worsen, and asked to  - Stay home and avoid contact with others until you get your results (4-5 days)  - Avoid travel on public transportation if possible (such as bus, train, or airplane) or o Sent to the Emergency Department by EMS for evaluation, COVID-19 testing  and  o possible admission depending on your condition and test results.  What to do if you are LOW RISK for COVID-19?  Reduce your risk of any infection by using the same precautions used for avoiding the common cold or flu:  Marland Kitchen Wash your hands often with soap and warm water for at least 20 seconds.  If soap and water are not readily available,  . use  an alcohol-based hand sanitizer with at least 60% alcohol.  . If coughing or sneezing, cover your mouth and nose by coughing or sneezing into the elbow areas of your shirt or coat, .  into a tissue or into your sleeve (not your hands). . Avoid shaking hands with others and consider head nods or verbal greetings only. . Avoid touching your eyes, nose, or mouth with unwashed hands.  . Avoid close contact with people who are sick. . Avoid places or events with large numbers of people in one location, like concerts or sporting events. . Carefully consider travel plans you have or are making. . If you are planning any travel outside or inside the [place], visit the CDC's Travelers' Health webpage for the latest health notices. . If you have some symptoms but not all symptoms,  continue to monitor at home and seek medical attention  . if your symptoms worsen. . If you are having a medical emergency, call 911.   . >>>>>>>>>>>>>>>>>>>>>>>>>>>>>>>>> . We Do NOT Approve of  [facility], [employer]  Our Patients  To Do Home Visits & We Do NOT Approve of [employer] > > > > > > > > > > > > > > > > > > > > > > > > > > > > > > > > > > > > > > >  Preventive Care for Adults  A healthy lifestyle and preventive care can promote health and wellness. Preventive health guidelines for women include the following key practices.  A routine yearly physical is a good way to check with your health care provider about your health and preventive screening. It is a chance to share any concerns and updates on your health and to receive a thorough exam.  Visit your dentist for a routine exam and preventive care every 6 months. Brush your teeth twice a day and floss once a day. Good oral hygiene prevents tooth decay and gum disease.  The frequency of eye exams is based on your age, health, family medical history, use of contact lenses, and other factors. Follow your health care provider's recommendations for frequency of eye exams.  Eat a healthy diet. Foods like vegetables, fruits, whole grains, low-fat dairy products, and lean protein foods contain the nutrients you need without too many calories. Decrease your intake of foods high in solid fats, added sugars, and salt. Eat the right amount of calories for you. Get information about a proper diet from your health care provider, if necessary.  Regular physical exercise is one of the most important things you can do for your health. Most adults should get at least 150 minutes of moderate-intensity exercise (any activity that increases your heart rate and causes you to sweat) each week. In addition, most adults need muscle-strengthening exercises on 2 or more days a week.  Maintain a healthy weight. The body mass  index (BMI) is a screening tool to identify possible weight problems. It provides an estimate of body fat based on height and weight. Your health care provider can find your BMI and can help you achieve or maintain a healthy weight. For adults 20 years and older:  A BMI below 18.5 is considered underweight.  A BMI of 18.5 to 24.9 is normal.  A BMI of 25 to 29.9 is considered overweight.  A BMI of 30 and above is considered obese.  Maintain normal blood lipids and cholesterol levels by exercising and minimizing your intake of saturated fat. Eat a  balanced diet with plenty of fruit and vegetables. If your lipid or cholesterol levels are high, you are over 50, or you are at high risk for heart disease, you may need your cholesterol levels checked more frequently. Ongoing high lipid and cholesterol levels should be treated with medicines if diet and exercise are not working.  If you smoke, find out from your health care provider how to quit. If you do not use tobacco, do not start.  Lung cancer screening is recommended for adults aged 65-80 years who are at high risk for developing lung cancer because of a history of smoking. A yearly low-dose CT scan of the lungs is recommended for people who have at least a 30-pack-year history of smoking and are a current smoker or have quit within the past 15 years. A pack year of smoking is smoking an average of 1 pack of cigarettes a day for 1 year (for example: 1 pack a day for 30 years or 2 packs a day for 15 years). Yearly screening should continue until the smoker has stopped smoking for at least 15 years. Yearly screening should be stopped for people who develop a health problem that would prevent them from having lung cancer treatment.  Avoid use of street drugs. Do not share needles with anyone. Ask for help if you need support or instructions about stopping the use of drugs.  High blood pressure causes heart disease and increases the risk of stroke.   Ongoing high blood pressure should be treated with medicines if weight loss and exercise do not work.  If you are 34-34 years old, ask your health care provider if you should take aspirin to prevent strokes.  Diabetes screening involves taking a blood sample to check your fasting blood sugar level. This should be done once every 3 years, after age 58, if you are within normal weight and without risk factors for diabetes. Testing should be considered at a younger age or be carried out more frequently if you are overweight and have at least 1 risk factor for diabetes.  Breast cancer screening is essential preventive care for women. You should practice "breast self-awareness." This means understanding the normal appearance and feel of your breasts and may include breast self-examination. Any changes detected, no matter how small, should be reported to a health care provider. Women in their 77s and 30s should have a clinical breast exam (CBE) by a health care provider as part of a regular health exam every 1 to 3 years. After age 33, women should have a CBE every year. Starting at age 71, women should consider having a mammogram (breast X-ray test) every year. Women who have a family history of breast cancer should talk to their health care provider about genetic screening. Women at a high risk of breast cancer should talk to their health care providers about having an MRI and a mammogram every year.  Breast cancer gene (BRCA)-related cancer risk assessment is recommended for women who have family members with BRCA-related cancers. BRCA-related cancers include breast, ovarian, tubal, and peritoneal cancers. Having family members with these cancers may be associated with an increased risk for harmful changes (mutations) in the breast cancer genes BRCA1 and BRCA2. Results of the assessment will determine the need for genetic counseling and BRCA1 and BRCA2 testing.  Routine pelvic exams to screen for cancer are  no longer recommended for nonpregnant women who are considered low risk for cancer of the pelvic organs (ovaries, uterus, and vagina) and  who do not have symptoms. Ask your health care provider if a screening pelvic exam is right for you.  If you have had past treatment for cervical cancer or a condition that could lead to cancer, you need Pap tests and screening for cancer for at least 20 years after your treatment. If Pap tests have been discontinued, your risk factors (such as having a new sexual partner) need to be reassessed to determine if screening should be resumed. Some women have medical problems that increase the chance of getting cervical cancer. In these cases, your health care provider may recommend more frequent screening and Pap tests.    Colorectal cancer can be detected and often prevented. Most routine colorectal cancer screening begins at the age of 29 years and continues through age 36 years. However, your health care provider may recommend screening at an earlier age if you have risk factors for colon cancer. On a yearly basis, your health care provider may provide home test kits to check for hidden blood in the stool. Use of a small camera at the end of a tube, to directly examine the colon (sigmoidoscopy or colonoscopy), can detect the earliest forms of colorectal cancer. Talk to your health care provider about this at age 71, when routine screening begins.  Direct exam of the colon should be repeated every 5-10 years through age 37 years, unless early forms of pre-cancerous polyps or small growths are found.  Osteoporosis is a disease in which the bones lose minerals and strength with aging. This can result in serious bone fractures or breaks. The risk of osteoporosis can be identified using a bone density scan. Women ages 64 years and over and women at risk for fractures or osteoporosis should discuss screening with their health care providers. Ask your health care provider whether  you should take a calcium supplement or vitamin D to reduce the rate of osteoporosis.  Menopause can be associated with physical symptoms and risks. Hormone replacement therapy is available to decrease symptoms and risks. You should talk to your health care provider about whether hormone replacement therapy is right for you.  Use sunscreen. Apply sunscreen liberally and repeatedly throughout the day. You should seek shade when your shadow is shorter than you. Protect yourself by wearing long sleeves, pants, a wide-brimmed hat, and sunglasses year round, whenever you are outdoors.  Once a month, do a whole body skin exam, using a mirror to look at the skin on your back. Tell your health care provider of new moles, moles that have irregular borders, moles that are larger than a pencil eraser, or moles that have changed in shape or color.  Stay current with required vaccines (immunizations).  Influenza vaccine. All adults should be immunized every year.  Tetanus, diphtheria, and acellular pertussis (Td, Tdap) vaccine. Pregnant women should receive 1 dose of Tdap vaccine during each pregnancy. The dose should be obtained regardless of the length of time since the last dose. Immunization is preferred during the 27th-36th week of gestation. An adult who has not previously received Tdap or who does not know her vaccine status should receive 1 dose of Tdap. This initial dose should be followed by tetanus and diphtheria toxoids (Td) booster doses every 10 years. Adults with an unknown or incomplete history of completing a 3-dose immunization series with Td-containing vaccines should begin or complete a primary immunization series including a Tdap dose. Adults should receive a Td booster every 10 years.    Zoster vaccine. One dose  is recommended for adults aged 74 years or older unless certain conditions are present.    Pneumococcal 13-valent conjugate (PCV13) vaccine. When indicated, a person who is  uncertain of her immunization history and has no record of immunization should receive the PCV13 vaccine. An adult aged 77 years or older who has certain medical conditions and has not been previously immunized should receive 1 dose of PCV13 vaccine. This PCV13 should be followed with a dose of pneumococcal polysaccharide (PPSV23) vaccine. The PPSV23 vaccine dose should be obtained at least 1 or more year(s) after the dose of PCV13 vaccine. An adult aged 62 years or older who has certain medical conditions and previously received 1 or more doses of PPSV23 vaccine should receive 1 dose of PCV13. The PCV13 vaccine dose should be obtained 1 or more years after the last PPSV23 vaccine dose.    Pneumococcal polysaccharide (PPSV23) vaccine. When PCV13 is also indicated, PCV13 should be obtained first. All adults aged 50 years and older should be immunized. An adult younger than age 1 years who has certain medical conditions should be immunized. Any person who resides in a nursing home or long-term care facility should be immunized. An adult smoker should be immunized. People with an immunocompromised condition and certain other conditions should receive both PCV13 and PPSV23 vaccines. People with human immunodeficiency virus (HIV) infection should be immunized as soon as possible after diagnosis. Immunization during chemotherapy or radiation therapy should be avoided. Routine use of PPSV23 vaccine is not recommended for American Indians, [place] Natives, or people younger than 65 years unless there are medical conditions that require PPSV23 vaccine. When indicated, people who have unknown immunization and have no record of immunization should receive PPSV23 vaccine. One-time revaccination 5 years after the first dose of PPSV23 is recommended for people aged 19-64 years who have chronic kidney failure, nephrotic syndrome, asplenia, or immunocompromised conditions. People who received 1-2 doses of PPSV23 before age 79  years should receive another dose of PPSV23 vaccine at age 4 years or later if at least 5 years have passed since the previous dose. Doses of PPSV23 are not needed for people immunized with PPSV23 at or after age 73 years.   Preventive Services / Frequency  Ages 24 years and over  Blood pressure check.  Lipid and cholesterol check.  Lung cancer screening. / Every year if you are aged 109-80 years and have a 30-pack-year history of smoking and currently smoke or have quit within the past 15 years. Yearly screening is stopped once you have quit smoking for at least 15 years or develop a health problem that would prevent you from having lung cancer treatment.  Clinical breast exam.** / Every year after age 51 years.   BRCA-related cancer risk assessment.** / For women who have family members with a BRCA-related cancer (breast, ovarian, tubal, or peritoneal cancers).  Mammogram.** / Every year beginning at age 8 years and continuing for as long as you are in good health. Consult with your health care provider.  Pap test.** / Every 3 years starting at age 74 years through age 51 or 23 years with 3 consecutive normal Pap tests. Testing can be stopped between 65 and 70 years with 3 consecutive normal Pap tests and no abnormal Pap or HPV tests in the past 10 years.  Fecal occult blood test (FOBT) of stool. / Every year beginning at age 68 years and continuing until age 8 years. You may not need to do this test if  you get a colonoscopy every 10 years.  Flexible sigmoidoscopy or colonoscopy.** / Every 5 years for a flexible sigmoidoscopy or every 10 years for a colonoscopy beginning at age 57 years and continuing until age 52 years.  Hepatitis C blood test.** / For all people born from [birth year] through 1963 and any individual with known risks for hepatitis C.  Osteoporosis screening.** / A one-time screening for women ages 89 years and over and women at risk for fractures or osteoporosis.  Skin  self-exam. / Monthly.  Influenza vaccine. / Every year.  Tetanus, diphtheria, and acellular pertussis (Tdap/Td) vaccine.** / 1 dose of Td every 10 years.  Zoster vaccine.** / 1 dose for adults aged 66 years or older.  Pneumococcal 13-valent conjugate (PCV13) vaccine.** / Consult your health care provider.  Pneumococcal polysaccharide (PPSV23) vaccine.** / 1 dose for all adults aged 90 years and older. Screening for abdominal aortic aneurysm (AAA)  by ultrasound is recommended for people who have history of high blood pressure or who are current or former smokers. ++++++++++++++++++++ Recommend Adult Low Dose Aspirin or  coated  Aspirin 81 mg daily  To reduce risk of Colon Cancer 40 %,  Skin Cancer 26 % ,  Melanoma 46%  and  Pancreatic cancer 60% ++++++++++++++++++++ Vitamin D goal  is between 70-100.  Please make sure that you are taking your Vitamin D as directed.  It is very important as a natural anti-inflammatory  helping hair, skin, and nails, as well as reducing stroke and heart attack risk.  It helps your bones and helps with mood. It also decreases numerous cancer risks so please take it as directed.  Low Vit D is associated with a 200-300% higher risk for CANCER  and 200-300% higher risk for HEART   ATTACK  &  STROKE.   .....................................Marland Kitchen It is also associated with higher death rate at younger ages,  autoimmune diseases like Rheumatoid arthritis, Lupus, Multiple Sclerosis.    Also many other serious conditions, like depression, Alzheimer's Dementia, infertility, muscle aches, fatigue, fibromyalgia - just to name a few. ++++++++++++++++++ Recommend the book "The END of DIETING" by Dr Excell Seltzer  & the book "The END of DIABETES " by Dr Excell Seltzer At [email or web address] - get book & Audio CD's    Being diabetic has a  300% increased risk for heart attack, stroke, cancer, and alzheimer- type vascular dementia. It is very important that you work harder with  diet by avoiding all foods that are white. Avoid white rice (brown & wild rice is OK), white potatoes (sweetpotatoes in moderation is OK), White bread or wheat bread or anything made out of white flour like bagels, donuts, rolls, buns, biscuits, cakes, pastries, cookies, pizza crust, and pasta (made from white flour & egg whites) - vegetarian pasta or spinach or wheat pasta is OK. Multigrain breads like Arnold's or [employer], or multigrain sandwich thins or flatbreads.  Diet, exercise and weight loss can reverse and cure diabetes in the early stages.  Diet, exercise and weight loss is very important in the control and prevention of complications of diabetes which affects every system in your body, ie. Brain - dementia/stroke, eyes - glaucoma/blindness, heart - heart attack/heart failure, kidneys - dialysis, stomach - gastric paralysis, intestines - malabsorption, nerves - severe painful neuritis, circulation - gangrene & loss of a leg(s), and finally cancer and Alzheimers.    I recommend avoid fried & greasy foods,  sweets/candy, white rice (brown or wild rice or Quinoa is  OK), white potatoes (sweet potatoes are OK) - anything made from white flour - bagels, doughnuts, rolls, buns, biscuits,white and wheat breads, pizza crust and traditional pasta made of white flour & egg white(vegetarian pasta or spinach or wheat pasta is OK).  Multi-grain bread is OK - like multi-grain flat bread or sandwich thins. Avoid alcohol in excess. Exercise is also important.    Eat all the vegetables you want - avoid meat, especially red meat and dairy - especially cheese.  Cheese is the most concentrated form of trans-fats which is the worst thing to clog up our arteries. Veggie cheese is OK which can be found in the fresh produce section at [employer] or [employer] or [employer]  +++++++++++++++++++ DASH Eating Plan  DASH stands for "Dietary Approaches to Stop Hypertension."   The DASH eating plan is a healthy  eating plan that has been shown to reduce high blood pressure (hypertension). Additional health benefits may include reducing the risk of type 2 diabetes mellitus, heart disease, and stroke. The DASH eating plan may also help with weight loss. WHAT DO I NEED TO KNOW ABOUT THE DASH EATING PLAN? For the DASH eating plan, you will follow these general guidelines:  Choose foods with a percent daily value for sodium of less than 5% (as listed on the food label).  Use salt-free seasonings or herbs instead of table salt or sea salt.  Check with your health care provider or pharmacist before using salt substitutes.  Eat lower-sodium products, often labeled as "lower sodium" or "no salt added."  Eat fresh foods.  Eat more vegetables, fruits, and low-fat dairy products.  Choose whole grains. Look for the word "whole" as the first word in the ingredient list.  Choose fish   Limit sweets, desserts, sugars, and sugary drinks.  Choose heart-healthy fats.  Eat veggie cheese   Eat more home-cooked food and less restaurant, buffet, and fast food.  Limit fried foods.  Cook foods using methods other than frying.  Limit canned vegetables. If you do use them, rinse them well to decrease the sodium.  When eating at a restaurant, ask that your food be prepared with less salt, or no salt if possible.                      WHAT FOODS CAN I EAT? Read Dr Fara Olden Fuhrman's books on The End of Dieting & The End of Diabetes  Grains Whole grain or whole wheat bread. Brown rice. Whole grain or whole wheat pasta. Quinoa, bulgur, and whole grain cereals. Low-sodium cereals. Corn or whole wheat flour tortillas. Whole grain cornbread. Whole grain crackers. Low-sodium crackers.  Vegetables Fresh or frozen vegetables (raw, steamed, roasted, or grilled). Low-sodium or reduced-sodium tomato and vegetable juices. Low-sodium or reduced-sodium tomato sauce and paste. Low-sodium or reduced-sodium canned vegetables.    Fruits All fresh, canned (in natural juice), or frozen fruits.  Protein Products  All fish and seafood.  Dried beans, peas, or lentils. Unsalted nuts and seeds. Unsalted canned beans.  Dairy Low-fat dairy products, such as skim or 1% milk, 2% or reduced-fat cheeses, low-fat ricotta or cottage cheese, or plain low-fat yogurt. Low-sodium or reduced-sodium cheeses.  Fats and Oils Tub margarines without trans fats. Light or reduced-fat mayonnaise and salad dressings (reduced sodium). Avocado. Safflower, olive, or canola oils. Natural peanut or almond butter.  Other Unsalted popcorn and pretzels. The items listed above may not be a complete list of recommended foods or beverages. Contact your  dietitian for more options.  +++++++++++++++  WHAT FOODS ARE NOT RECOMMENDED? Grains/ White flour or wheat flour White bread. White pasta. White rice. Refined cornbread. Bagels and croissants. Crackers that contain trans fat.  Vegetables  Creamed or fried vegetables. Vegetables in a . Regular canned vegetables. Regular canned tomato sauce and paste. Regular tomato and vegetable juices.  Fruits Dried fruits. Canned fruit in light or heavy syrup. Fruit juice.  Meat and Other Protein Products Meat in general - RED meat & White meat.  Fatty cuts of meat. Ribs, chicken wings, all processed meats as bacon, sausage, bologna, salami, fatback, hot dogs, bratwurst and packaged luncheon meats.  Dairy Whole or 2% milk, cream, half-and-half, and cream cheese. Whole-fat or sweetened yogurt. Full-fat cheeses or blue cheese. Non-dairy creamers and whipped toppings. Processed cheese, cheese spreads, or cheese curds.  Condiments Onion and garlic salt, seasoned salt, table salt, and sea salt. Canned and packaged gravies. Worcestershire sauce. Tartar sauce. Barbecue sauce. Teriyaki sauce. Soy sauce, including reduced sodium. Steak sauce. Fish sauce. Oyster sauce. Cocktail sauce. Horseradish. Ketchup and mustard.  Meat flavorings and tenderizers. Bouillon cubes. Hot sauce. Tabasco sauce. Marinades. Taco seasonings. Relishes.  Fats and Oils Butter, stick margarine, lard, shortening and bacon fat. Coconut, palm kernel, or palm oils. Regular salad dressings.  Pickles and olives. Salted popcorn and pretzels.  The items listed above may not be a complete list of foods and beverages to avoid.

## 2019-06-17 LAB — TSH: TSH: 0.94 mIU/L (ref 0.40–4.50)

## 2019-06-17 LAB — URINALYSIS W MICROSCOPIC + REFLEX CULTURE
Bacteria, UA: NONE SEEN /HPF
Bilirubin Urine: NEGATIVE
Glucose, UA: NEGATIVE
Hgb urine dipstick: NEGATIVE
Hyaline Cast: NONE SEEN /LPF
Ketones, ur: NEGATIVE
Leukocyte Esterase: NEGATIVE
Nitrites, Initial: NEGATIVE
Protein, ur: NEGATIVE
RBC / HPF: NONE SEEN /HPF (ref 0–2)
Specific Gravity, Urine: 1.005 (ref 1.001–1.03)
Squamous Epithelial / HPF: NONE SEEN /HPF (ref <=5)
WBC, UA: NONE SEEN /HPF (ref 0–5)
pH: 6 (ref 5.0–8.0)

## 2019-06-17 LAB — CBC WITH DIFFERENTIAL/PLATELET
Absolute Monocytes: 670 cells/uL (ref 200–950)
Basophils Absolute: 39 cells/uL (ref 0–200)
Basophils Relative: 0.5 %
Eosinophils Absolute: 39 cells/uL (ref 15–500)
Eosinophils Relative: 0.5 %
HCT: 35.5 % (ref 35.0–45.0)
Hemoglobin: 11.7 g/dL (ref 11.7–15.5)
Lymphs Abs: 1332 cells/uL (ref 850–3900)
MCH: 29.8 pg (ref 27.0–33.0)
MCHC: 33 g/dL (ref 32.0–36.0)
MCV: 90.6 fL (ref 80.0–100.0)
MPV: 11.4 fL (ref 7.5–12.5)
Monocytes Relative: 8.7 %
Neutro Abs: 5621 cells/uL (ref 1500–7800)
Neutrophils Relative %: 73 %
Platelets: 225 10*3/uL (ref 140–400)
RBC: 3.92 10*6/uL (ref 3.80–5.10)
RDW: 14.6 % (ref 11.0–15.0)
Total Lymphocyte: 17.3 %
WBC: 7.7 10*3/uL (ref 3.8–10.8)

## 2019-06-17 LAB — LIPID PANEL
Cholesterol: 147 mg/dL (ref <200)
HDL: 74 mg/dL (ref >=50)
LDL Cholesterol (Calc): 50 mg/dL (calc)
Non-HDL Cholesterol (Calc): 73 mg/dL (calc) (ref <130)
Total CHOL/HDL Ratio: 2 (calc) (ref <5.0)
Triglycerides: 144 mg/dL (ref <150)

## 2019-06-17 LAB — HEMOGLOBIN A1C
Hgb A1c MFr Bld: 5.7 % of total Hgb — ABNORMAL HIGH (ref <5.7)
Mean Plasma Glucose: 117 (calc)
eAG (mmol/L): 6.5 (calc)

## 2019-06-17 LAB — COMPLETE METABOLIC PANEL WITH GFR
AG Ratio: 1.8 (calc) (ref 1.0–2.5)
ALT: 12 U/L (ref 6–29)
AST: 20 U/L (ref 10–35)
Albumin: 4.8 g/dL (ref 3.6–5.1)
Alkaline phosphatase (APISO): 87 U/L (ref 37–153)
BUN/Creatinine Ratio: 11 (calc) (ref 6–22)
BUN: 11 mg/dL (ref 7–25)
CO2: 29 mmol/L (ref 20–32)
Calcium: 9.5 mg/dL (ref 8.6–10.4)
Chloride: 97 mmol/L — ABNORMAL LOW (ref 98–110)
Creat: 0.96 mg/dL — ABNORMAL HIGH (ref 0.60–0.88)
GFR, Est African American: 63 mL/min/{1.73_m2} (ref >=60)
GFR, Est Non African American: 55 mL/min/{1.73_m2} — ABNORMAL LOW (ref >=60)
Globulin: 2.6 g/dL (calc) (ref 1.9–3.7)
Glucose, Bld: 101 mg/dL — ABNORMAL HIGH (ref 65–99)
Potassium: 3.7 mmol/L (ref 3.5–5.3)
Sodium: 138 mmol/L (ref 135–146)
Total Bilirubin: 0.7 mg/dL (ref 0.2–1.2)
Total Protein: 7.4 g/dL (ref 6.1–8.1)

## 2019-06-17 LAB — NO CULTURE INDICATED

## 2019-06-17 LAB — MAGNESIUM: Magnesium: 1.8 mg/dL (ref 1.5–2.5)

## 2019-06-29 ENCOUNTER — Ambulatory Visit: Payer: PPO | Admitting: Cardiology

## 2019-06-29 ENCOUNTER — Other Ambulatory Visit: Payer: Self-pay

## 2019-06-29 ENCOUNTER — Encounter: Payer: Self-pay | Admitting: Cardiology

## 2019-06-29 DIAGNOSIS — J439 Emphysema, unspecified: Secondary | ICD-10-CM

## 2019-06-29 DIAGNOSIS — I4891 Unspecified atrial fibrillation: Secondary | ICD-10-CM

## 2019-06-29 DIAGNOSIS — I5032 Chronic diastolic (congestive) heart failure: Secondary | ICD-10-CM | POA: Insufficient documentation

## 2019-06-29 DIAGNOSIS — I5043 Acute on chronic combined systolic (congestive) and diastolic (congestive) heart failure: Secondary | ICD-10-CM

## 2019-06-29 DIAGNOSIS — I5030 Unspecified diastolic (congestive) heart failure: Secondary | ICD-10-CM | POA: Insufficient documentation

## 2019-06-29 DIAGNOSIS — Z8673 Personal history of transient ischemic attack (TIA), and cerebral infarction without residual deficits: Secondary | ICD-10-CM | POA: Insufficient documentation

## 2019-06-29 HISTORY — DX: Personal history of transient ischemic attack (TIA), and cerebral infarction without residual deficits: Z86.73

## 2019-06-29 HISTORY — DX: Acute on chronic combined systolic (congestive) and diastolic (congestive) heart failure: I50.43

## 2019-06-29 NOTE — Assessment & Plan Note (Signed)
Via CXR 2013

## 2019-06-29 NOTE — Patient Instructions (Signed)
Medication Instructions:  Your physician recommends that you continue on your current medications as directed. Please refer to the Current Medication list given to you today. *If you need a refill on your cardiac medications before your next appointment, please call your pharmacy*  Lab Work: None  If you have labs (blood work) drawn today and your tests are completely normal, you will receive your results only by: Marland Kitchen MyChart Message (if you have MyChart) OR . A paper copy in the mail If you have any lab test that is abnormal or we need to change your treatment, we will call you to review the results.  Testing/Procedures: None   Follow-Up: At [facility], you and your health needs are our priority.  As part of our continuing mission to provide you with exceptional heart care, we have created designated Provider Care Teams.  These Care Teams include your primary Cardiologist (physician) and Advanced Practice Providers (APPs -  Physician Assistants and Nurse Practitioners) who all work together to provide you with the care you need, when you need it.  Your next appointment:   3 month(s)  The format for your next appointment:   In Person  Provider:   Kerin Ransom, PA-C  Other Instructions

## 2019-06-29 NOTE — Assessment & Plan Note (Signed)
This diagnosis was based on CT scan and CXR from an ED visit for hematuria 06/05/2019 She does not have CHF symptoms

## 2019-06-29 NOTE — Assessment & Plan Note (Signed)
On elliquis 2.5mg  BID due to weighing less than 60 kg.  Rate controlled

## 2019-06-29 NOTE — Assessment & Plan Note (Signed)
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

## 2019-06-29 NOTE — Progress Notes (Signed)
Cardiology Office Note:    Date:  06/29/2019   ID:  Debra White, DOB [birth year], MRN [id number]  PCP:  Unk Pinto, MD  Cardiologist:  Skeet Latch, MD  Electrophysiologist:  None   Referring MD: Unk Pinto, MD   Chief Complaint  Patient presents with  . Follow-up  F/U from ED visit 06/05/2019  History of Present Illness:    Debra White is a pleasant 86 y.o. female, lives at home with her husband who has had prior strokes.  They have a daughter that lives near [place] "but she doesn't visit much".  The patient has a hx of hypertension, COPD, anxiety, dyslipidemia, diverticular ocular disease, and history of splenic and renal infarcts (? In 2018) and Rt brain CVA.  She is on Eliquis 2.5 mg BID.  Echocardiogram done in 2019 showed an ejection fraction of 55 to 60% with severe left atrial enlargement.  After her infarcts a monitor was attempted to be placed but she could not tolerate it.  The patient had seen Dr. Oval Linsey in December 2019.  Dr. Oval Linsey wanted to proceed with a TEE to determine if there was a LV thrombus.  If not the patient was to have a loop recorder placed.  The patient declined to have this study done.  She is in the office today after an ER visit 06/05/2019 for hematuria and dysuria.  She had a renal CT scan done which incidentally showed congestive heart failure.  The patient does not describe symptoms of congestive heart failure.  She denies orthopnea lower extremity edema or unusual dyspnea on exertion.  She was given 1 dose of Lasix in the emergency room.  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   Past Medical History:  Diagnosis Date  . Acute on chronic combined systolic and diastolic CHF (congestive heart failure) ([place]) 06/29/2019  . Anxiety   . Arthritis   . Blood transfusion   . C. difficile colitis   . Candida esophagitis ([place]) 2013   EGD  . Cataract   . Chronic kidney disease    STONES  . Diverticulitis  04/05/2017  . Diverticulosis of colon (without mention of hemorrhage) 2007   Colonoscopy  . Dysrhythmia    RX  . Emphysema of lung ([place])   . Family history of colon cancer    sister  . Fibromyalgia   . Fracture, zygoma closed ([place]) 07/13/2011  . GERD (gastroesophageal reflux disease)   . Headache(784.0)   . Hyperlipemia   . Hypertension   . Kidney stones   . Ptosis, bilateral   . Renal infarct ([place])   . Splenic infarct     Past Surgical History:  Procedure Laterality Date  . ABDOMINAL HYSTERECTOMY    . BACK SURGERY     X2  . BACK SURGERY    . BREAST EXCISIONAL BIOPSY    . CERVICAL DISC SURGERY    . COLONOSCOPY    . FACIAL FRACTURE SURGERY    . HAND SURGERY     BIL   . ORIF TRIPOD FRACTURE  07/13/2011   Procedure: OPEN REDUCTION INTERNAL FIXATION (ORIF) TRIPOD FRACTURE;  Surgeon: Tyson Alias, MD;  Location: [facility];  Service: ENT;  Laterality: Right;  ORIF RIGHT ZYGOMA, ORBITAL FLOOR EXPLORATION WITH FROST STITCH (TEMPORARY TARSORRHAPHY)  . PTOSIS REPAIR Bilateral 11/22/2016   Procedure: INTERNAL PTOSIS REPAIR;  Surgeon: Clista Bernhardt, MD;  Location: [facility];  Service: Ophthalmology;  Laterality: Bilateral;  . SHOULDER ARTHROSCOPY W/  ROTATOR CUFF REPAIR     LFT    Current Medications: Current Meds  Medication Sig  . acetaminophen (TYLENOL) 500 MG tablet Take 1,000 mg by mouth at bedtime.   Marland Kitchen apixaban (ELIQUIS) 5 MG TABS tablet Take 1 tablet 2 x /day to Prevent Blood Clots (Patient taking differently: Take 2.5 mg by mouth 2 (two) times daily. )  . atenolol (TENORMIN) 100 MG tablet Take 0.5 tablets (50 mg total) by mouth 2 (two) times daily.  . cephALEXin (KEFLEX) 500 MG capsule Take 1 capsule (500 mg total) by mouth 2 (two) times daily.  . Cholecalciferol (VITAMIN D) 2000 units tablet Take 4,000 Units by mouth daily.   . clonazePAM (KLONOPIN) 1 MG tablet Take 1/2-1 tablet 2 - 3 x /day ONLY if needed for Anxiety Attack &  limit to 5 days /week to avoid addiction  (Patient taking differently: Take 0.5 mg by mouth 2 (two) times daily. )  . diclofenac sodium (VOLTAREN) 1 % GEL Apply 2 g topically 4 (four) times daily. (Patient taking differently: Apply 2 g topically 4 (four) times daily as needed (pain). )  . dicyclomine (BENTYL) 10 MG capsule Take 1 capsule (10 mg total) by mouth 3 (three) times daily before meals.  . dicyclomine (BENTYL) 20 MG tablet Take 1 tablet 4 x /day before Meals & Bedtime if needed for Nausea, Cramping, Bloating or Diarrhea  . digoxin (LANOXIN) 0.125 MG tablet Take 1 tablet Daily for fast / Irregular Heart Beat (Patient taking differently: Take 0.125 mg by mouth daily. )  . furosemide (LASIX) 40 MG tablet Take 1/2 to 1 tablet Daily for Fluid Retention & Ankle Swelling  . lovastatin (MEVACOR) 20 MG tablet Take 1 tablet at Bedtime for Cholesterol (Patient taking differently: Take 20 mg by mouth at bedtime. )  . olmesartan (BENICAR) 40 MG tablet Take 1/2 to 1 tablet daily for BP (Patient taking differently: Take 20 mg by mouth every evening. )  . omeprazole (PRILOSEC) 40 MG capsule TAKE 1 CAPSULE BY MOUTH DAILY BEFORE BREAKFAST (Patient taking differently: Take 40 mg by mouth daily after breakfast. )  . ondansetron (ZOFRAN) 8 MG tablet Take 1 tablet 3 x  /day if needed for Nausea     Allergies:   Latex, Amoxicillin, Aspirin, Barbiturates, Celebrex [celecoxib], Codeine, Evista [raloxifene], Lipitor [atorvastatin], Morphine and related, Oruvail [ketoprofen], Pantoprazole, Paraffin, Prochlorperazine, Proloprim [trimethoprim], Venlafaxine, Vibra-tab [doxycycline], Vioxx [rofecoxib], Betadine [povidone iodine], and Caffeine   Social History   Socioeconomic History  . Marital status: Married    Spouse name: Not on file  . Number of children: 1  . Years of education: 57  . Highest education level: Not on file  Occupational History  . Occupation: Retired    Fish farm manager: RETIRED  Tobacco Use  . Smoking status: Never Smoker  . Smokeless  tobacco: Never Used  Substance and Sexual Activity  . Alcohol use: No  . Drug use: No  . Sexual activity: Not on file    Comment: HYSTER  Other Topics Concern  . Not on file  Social History Narrative   [place].   Right-handed.   No caffeine use.   Social Determinants of Health   Financial Resource Strain:   . Difficulty of Paying Living Expenses: Not on file  Food Insecurity:   . Worried About Charity fundraiser in the Last Year: Not on file  . Ran Out of Food in the Last Year: Not on file  Transportation  Needs:   . Lack of Transportation (Medical): Not on file  . Lack of Transportation (Non-Medical): Not on file  Physical Activity:   . Days of Exercise per Week: Not on file  . Minutes of Exercise per Session: Not on file  Stress:   . Feeling of Stress : Not on file  Social Connections:   . Frequency of Communication with Friends and Family: Not on file  . Frequency of Social Gatherings with Friends and Family: Not on file  . Attends Religious Services: Not on file  . Active Member of Clubs or Organizations: Not on file  . Attends Archivist Meetings: Not on file  . Marital Status: Not on file     Family History: The patient's family history includes Breast cancer (age of onset: 56) in her sister; Cancer in her sister, sister, and sister; Colon cancer in her sister; Heart attack in her father; Heart disease in her father, mother, and sister; Hyperlipidemia in her daughter; Hypertension in her daughter.  ROS:   Please see the history of present illness.     All other systems reviewed and are negative.  EKGs/Labs/Other Studies Reviewed:    The following studies were reviewed today: Renal CT 06/05/2019 PCXR 06/05/2019  EKG:  EKG is not ordered today.  The ekg ordered 06/05/2019 today demonstrates AF with CVR  Recent Labs: 06/16/2019: ALT 12; BUN 11; Creat 0.96; Hemoglobin 11.7; Magnesium 1.8; Platelets 225; Potassium 3.7;  Sodium 138; TSH 0.94  Recent Lipid Panel    Component Value Date/Time   CHOL 147 06/16/2019 1101   TRIG 144 06/16/2019 1101   HDL 74 06/16/2019 1101   CHOLHDL 2.0 06/16/2019 1101   VLDL 31 (H) 02/05/2017 1149   LDLCALC 50 06/16/2019 1101    Physical Exam:    VS:  BP 138/64 (BP Location: Left Arm, Patient Position: Sitting, Cuff Size: Normal)   Pulse (!) 52   Temp 97.7 F (36.5 C)   Ht 5\' 4"  (1.626 m)   Wt 102 lb (46.3 kg)   BMI 17.51 kg/m     Wt Readings from Last 3 Encounters:  06/29/19 102 lb (46.3 kg)  06/16/19 106 lb 3.2 oz (48.2 kg)  06/08/19 105 lb (47.6 kg)     GEN:  Thin-102 lbs, female in no acute distress HEENT: Normal NECK: No JVD; No carotid bruits CARDIAC: irregularly irregular, no murmurs, rubs, gallops RESPIRATORY:  Clear to auscultation without rales, wheezing or rhonchi, decreased rt base ABDOMEN: Soft, non-tender, non-distended MUSCULOSKELETAL:  No edema; No deformity  SKIN: Warm and dry NEUROLOGIC:  Alert and oriented x 3 PSYCHIATRIC:  Normal affect   ASSESSMENT:    Atrial fibrillation with controlled ventricular response (HCC) On elliquis 2.5mg  BID due to weighing less than 60 kg.  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  We discussed in details the signs and symptoms of congestive heart failure and she will let us know if she develops this.  I noted that her weight has actually been falling, she is down to 102 pounds.  She realizes  this believes it is from being "sick recently", referring to her recent UTI treatment.    For now we will not  pursue TEE, I am not sure this would change her treatment now that we have an EKG to document atrial fibrillation it appears she will be on chronic anticoagulation.  I will see her back in a few months to see how she is doing.   Medication Adjustments/Labs and Tests Ordered: Current medicines are reviewed at length with the patient today.  Concerns regarding medicines are outlined above.  No orders of the defined types were placed in this encounter.  No orders of the defined types were placed in this encounter.   Patient Instructions  Medication Instructions:  Your physician recommends that you continue on your current medications as directed. Please refer to the Current Medication list given to you today. *If you need a refill on your cardiac medications before your next appointment, please call your pharmacy*  Lab Work: None  If you have labs (blood work) drawn today and your tests are completely normal, you will receive your results only by: Marland Kitchen MyChart Message (if you have MyChart) OR . A paper copy in the mail If you have any lab test that is abnormal or we need to change your treatment, we will call you to review the results.  Testing/Procedures: None   Follow-Up: At [facility], you and your health needs are our priority.  As part of our continuing mission to provide you with exceptional heart care, we have created designated Provider Care Teams.  These Care Teams include your primary Cardiologist (physician) and Advanced Practice Providers (APPs -  Physician Assistants and Nurse Practitioners) who all work together to provide you with the care you need, when you need it.  Your next appointment:   3 month(s)  The format for your next appointment:   In Person  Provider:   Kerin Ransom, PA-C  Other Instructions     Signed, Kerin Ransom, PA-C  06/29/2019 9:31 AM    [place]

## 2019-06-30 ENCOUNTER — Other Ambulatory Visit: Payer: Self-pay | Admitting: Internal Medicine

## 2019-07-11 ENCOUNTER — Other Ambulatory Visit: Payer: Self-pay | Admitting: Internal Medicine

## 2019-07-14 ENCOUNTER — Encounter: Payer: Self-pay | Admitting: Adult Health Nurse Practitioner

## 2019-07-14 ENCOUNTER — Other Ambulatory Visit: Payer: Self-pay

## 2019-07-14 ENCOUNTER — Ambulatory Visit (INDEPENDENT_AMBULATORY_CARE_PROVIDER_SITE_OTHER): Payer: PPO | Admitting: Adult Health Nurse Practitioner

## 2019-07-14 VITALS — BP 132/64 | HR 56 | Temp 97.5°F | Wt 108.4 lb

## 2019-07-14 DIAGNOSIS — I10 Essential (primary) hypertension: Secondary | ICD-10-CM | POA: Diagnosis not present

## 2019-07-14 DIAGNOSIS — I4891 Unspecified atrial fibrillation: Secondary | ICD-10-CM

## 2019-07-14 DIAGNOSIS — R300 Dysuria: Secondary | ICD-10-CM | POA: Diagnosis not present

## 2019-07-14 MED ORDER — PHENAZOPYRIDINE HCL 100 MG PO TABS: 100.0000 mg | ORAL_TABLET | Freq: Three times a day (TID) | ORAL | 0 refills | Status: DC | PRN

## 2019-07-14 NOTE — Progress Notes (Signed)
Assessment and Plan:  There are no diagnoses linked to this encounter. Jury was seen today for dysuria.  Diagnoses and all orders for this visit:  Dysuria -     phenazopyridine (PYRIDIUM) 100 MG tablet; Take 1 tablet (100 mg total) by mouth 3 (three) times daily as needed for pain. -     Urinalysis w microscopic + reflex cultur -Referral to urology Discussed importance of regular bowel movements/fiber and activity, increase water intake.  Essential hypertension Continue current medications: Monitor blood pressure at home; call if consistently over 130/80 Continue DASH diet.   Reminder to go to the ER if any CP, SOB, nausea, dizziness, severe HA, changes vision/speech, left arm numbness and tingling and jaw pain.  Atrial fibrillation with controlled ventricular response (HCC) Continue elequis 2.5 mg BID for a. Fib and splenic/renal infarcts; low dose   discussed bleeding complications Rate controlled today;  Last OV 06/29/19, follow up in 3 months.  Other orders -     Urine Culture -     REFLEXIVE URINE CULTURE      Further disposition pending results of labs. Discussed med's effects and SE's.   Over 30 minutes of exam, counseling, chart review, and critical decision making was performed.   Future Appointments  Date Time Provider [place]  10/06/2019  9:30 AM Vicie Mutters, PA-C [facility] None  10/09/2019 10:30 AM Erlene Quan, PA-C [facility]  12/15/2019  9:00 AM Unk Pinto, MD [facility] None  06/23/2020 10:30 AM Garnet Sierras, NP [facility] None    ------------------------------------------------------------------------------------------------------------------   HPI 86 y.o.female presents for evaluation of dysuria.  She reports she has had this in the past but it normal goes away.  She reports that she is having burnings with urination 2-3 weeks ago.  She started taking lasix to see if this would help.  She also tried vagasil topical cream  that has helped some.   She has been to gynecologist, over two years ago regarding this.  We have discussed vaginal atrophy as etiology and patient did not want to try treatment.  Discussed this in office again today regarding OTC Replens to help with symptoms. Not interested in this.  She has hospital follow up on 06/08/19 in our office and deferred urology referral at that time.  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  She is on eliquis 2.5mg  BID for atrial fibrilation.  She denies any hematuria.  Will check urine today.  Strongly advise urology consoltation related to persistant symptoms.  She denies any vaginal itching, discharge, or bleeding.  Denies any nausea vomiting or abdominal pains.    Reports she was constipated but she had a bowel movement today.    Past Medical History:  Diagnosis Date  . Acute on chronic combined systolic and diastolic CHF (congestive heart failure) ([place]) 06/29/2019  . Anxiety   . Arthritis   . Blood transfusion   . C. difficile colitis   . Candida esophagitis ([place]) 2013   EGD  . Cataract   . Chronic kidney disease    STONES  . Diverticulitis 04/05/2017  . Diverticulosis of colon (without mention of hemorrhage) 2007   Colonoscopy  . Dysrhythmia    RX  . Emphysema of lung ([place])   . Family history of colon cancer    sister  . Fibromyalgia   . Fracture, zygoma closed ([place]) 07/13/2011  . GERD (gastroesophageal reflux disease)   . Headache(784.0)   . Hyperlipemia   . Hypertension   . Kidney  stones   . Ptosis, bilateral   . Renal infarct ([place])   . Splenic infarct      Allergies  Allergen Reactions  . Latex Hives    TONGUE HAD BLISTERS WHEN HAD DENTAL SURGERY  . Amoxicillin Nausea And Vomiting  . Aspirin Other (See Comments)    Gi upset  . Barbiturates Other (See Comments)    Unknown reaction  . Celebrex [Celecoxib] Other (See Comments)    Unknown reaction  . Codeine Itching  . Evista [Raloxifene] Other (See Comments)     Unknown reaction  . Lipitor [Atorvastatin] Other (See Comments)    Unknown reaction  . Morphine And Related Other (See Comments)    Unknown reaction  . Oruvail [Ketoprofen] Other (See Comments)    Unknown reaction  . Pantoprazole Diarrhea  . Paraffin Other (See Comments)    Unknown reaction  . Prochlorperazine Other (See Comments)    Uncontrolled shaking  . Proloprim [Trimethoprim] Other (See Comments)    Unknown reaction  . Venlafaxine Nausea Only  . Vibra-Tab [Doxycycline] Other (See Comments)    Unknown reaction  . Vioxx [Rofecoxib] Other (See Comments)    edema  . Betadine [Povidone Iodine] Rash  . Caffeine Palpitations    Current Outpatient Medications on File Prior to Visit  Medication Sig  . acetaminophen (TYLENOL) 500 MG tablet Take 1,000 mg by mouth at bedtime.   Marland Kitchen apixaban (ELIQUIS) 5 MG TABS tablet Take 1 tablet 2 x /day to Prevent Blood Clots (Patient taking differently: Take 2.5 mg by mouth 2 (two) times daily. )  . atenolol (TENORMIN) 100 MG tablet Take 0.5 tablets (50 mg total) by mouth 2 (two) times daily.  . cephALEXin (KEFLEX) 500 MG capsule Take 1 capsule (500 mg total) by mouth 2 (two) times daily.  . Cholecalciferol (VITAMIN D) 2000 units tablet Take 4,000 Units by mouth daily.   . clonazePAM (KLONOPIN) 1 MG tablet Take 1/2-1 tablet 2 - 3 x /day ONLY if needed for Anxiety Attack &  limit to 5 days /week to avoid Addiction & Dementia  . diclofenac Sodium (VOLTAREN) 1 % GEL Apply 2 grams 3 to 4 x / day as needed for Pain  . dicyclomine (BENTYL) 10 MG capsule Take 1 capsule (10 mg total) by mouth 3 (three) times daily before meals.  . dicyclomine (BENTYL) 20 MG tablet Take 1 tablet 4 x /day before Meals & Bedtime if needed for Nausea, Cramping, Bloating or Diarrhea  . digoxin (LANOXIN) 0.125 MG tablet Take 1 tablet Daily for fast / Irregular Heart Beat (Patient taking differently: Take 0.125 mg by mouth daily. )  . furosemide (LASIX) 40 MG tablet Take 1/2 to 1  tablet Daily for Fluid Retention & Ankle Swelling  . gabapentin (NEURONTIN) 600 MG tablet Take 1/2 to 1 tablet s 3 to 4 x / day as needed for Facial Pain (Patient taking differently: Take 300 mg by mouth 3 (three) times daily. )  . lovastatin (MEVACOR) 20 MG tablet Take 1 tablet at Bedtime for Cholesterol (Patient taking differently: Take 20 mg by mouth at bedtime. )  . olmesartan (BENICAR) 40 MG tablet Take 1/2 to 1 tablet daily for BP (Patient taking differently: Take 20 mg by mouth every evening. )  . omeprazole (PRILOSEC) 40 MG capsule TAKE 1 CAPSULE BY MOUTH DAILY BEFORE BREAKFAST (Patient taking differently: Take 40 mg by mouth daily after breakfast. )  . ondansetron (ZOFRAN) 8 MG tablet Take 1 tablet 3 x  /day if  needed for Nausea  . [DISCONTINUED] omeprazole (PRILOSEC OTC) 20 MG tablet Take 20 mg by mouth daily.   No current facility-administered medications on file prior to visit.    ROS: all negative except above.   Physical Exam:  BP 132/64   Pulse (!) 56   Temp (!) 97.5 F (36.4 C)   Wt 108 lb 6.4 oz (49.2 kg)   SpO2 98%   BMI 18.61 kg/m   General Appearance: Well nourished, in no apparent distress. Eyes: PERRLA, EOMs, conjunctiva no swelling or erythema Sinuses: No Frontal/maxillary tenderness ENT/Mouth: Ext aud canals clear, TMs without erythema, bulging. No erythema, swelling, or exudate on post pharynx.  Tonsils not swollen or erythematous. Hearing normal.  Neck: Supple, thyroid normal.  Respiratory: Respiratory effort normal, BS equal bilaterally without rales, rhonchi, wheezing or stridor.  Cardio: RRR with no MRGs. Brisk peripheral pulses without edema.  Abdomen: Soft, + BS.  Non tender, suprapubic tenderness noted, no guarding, rebound, hernias, masses. Lymphatics: Non tender without lymphadenopathy.  Musculoskeletal: Full ROM, 5/5 strength, normal gait.  Skin: Warm, dry without rashes, lesions, ecchymosis.  Neuro: Cranial nerves intact. Normal muscle tone, no  cerebellar symptoms. Sensation intact.  Psych: Awake and oriented X 3, normal affect, Insight and Judgment appropriate.     Garnet Sierras, NP 3:15 PM [facility]

## 2019-07-14 NOTE — Patient Instructions (Signed)
    Dermatology Specialist [place] [phone]    We sent in a prescription for pyridium.  Take this three times a day with a full glass of water.  Take this for three days to see if this helps your symptoms.  We are going to check your urine and put in a referral for urology for you.   Increase the amount of water you drink 40-60oz a day.  Make sure you are getting enough fiber to help with regular bowel movements.

## 2019-07-15 ENCOUNTER — Other Ambulatory Visit: Payer: Self-pay | Admitting: Adult Health Nurse Practitioner

## 2019-07-15 DIAGNOSIS — N3090 Cystitis, unspecified without hematuria: Secondary | ICD-10-CM

## 2019-07-15 DIAGNOSIS — N3091 Cystitis, unspecified with hematuria: Secondary | ICD-10-CM

## 2019-07-15 MED ORDER — NITROFURANTOIN MONOHYD MACRO 100 MG PO CAPS: 100.0000 mg | ORAL_CAPSULE | Freq: Two times a day (BID) | ORAL | 0 refills | Status: DC

## 2019-07-16 LAB — URINALYSIS W MICROSCOPIC + REFLEX CULTURE
Bilirubin Urine: NEGATIVE
Glucose, UA: NEGATIVE
Hgb urine dipstick: NEGATIVE
Ketones, ur: NEGATIVE
Nitrites, Initial: NEGATIVE
Protein, ur: NEGATIVE
Specific Gravity, Urine: 1.017 (ref 1.001–1.03)
pH: 6 (ref 5.0–8.0)

## 2019-07-16 LAB — URINE CULTURE
MICRO NUMBER:: 10058649
SPECIMEN QUALITY:: ADEQUATE

## 2019-07-16 LAB — CULTURE INDICATED

## 2019-07-20 ENCOUNTER — Telehealth: Payer: Self-pay

## 2019-07-20 NOTE — Telephone Encounter (Signed)
Patient states that she has two more doses left of the macrobid. Seems to be feeling slightly better but today she was feeling really "bad". States that she is still having dysuria and now hurting on both sides of her vagina. States that she is trying to drink plenty of fluids. Plans on calling tomorrow morning to set up appointment if she is not feeling better.

## 2019-07-20 NOTE — Telephone Encounter (Signed)
Patient has upcoming appointment with Urology on 07/29/19. She vomited this morning and has nausea. Took Zofran and that seem to help. Doesn't have a fever. She states that she is experiencing dysuria, lower back and stomach pain. Patient would like know of any recommendations that you may have. Please advise.

## 2019-07-23 ENCOUNTER — Other Ambulatory Visit: Payer: Self-pay | Admitting: Internal Medicine

## 2019-07-23 DIAGNOSIS — Z1231 Encounter for screening mammogram for malignant neoplasm of breast: Secondary | ICD-10-CM

## 2019-07-29 DIAGNOSIS — Z87440 Personal history of urinary (tract) infections: Secondary | ICD-10-CM | POA: Diagnosis not present

## 2019-07-29 DIAGNOSIS — R310 Gross hematuria: Secondary | ICD-10-CM | POA: Diagnosis not present

## 2019-07-29 DIAGNOSIS — N952 Postmenopausal atrophic vaginitis: Secondary | ICD-10-CM | POA: Diagnosis not present

## 2019-08-04 DIAGNOSIS — H524 Presbyopia: Secondary | ICD-10-CM | POA: Diagnosis not present

## 2019-08-04 DIAGNOSIS — H35033 Hypertensive retinopathy, bilateral: Secondary | ICD-10-CM | POA: Diagnosis not present

## 2019-08-04 DIAGNOSIS — H52223 Regular astigmatism, bilateral: Secondary | ICD-10-CM | POA: Diagnosis not present

## 2019-08-04 DIAGNOSIS — I10 Essential (primary) hypertension: Secondary | ICD-10-CM | POA: Diagnosis not present

## 2019-08-04 DIAGNOSIS — Z9849 Cataract extraction status, unspecified eye: Secondary | ICD-10-CM | POA: Diagnosis not present

## 2019-08-04 DIAGNOSIS — H5202 Hypermetropia, left eye: Secondary | ICD-10-CM | POA: Diagnosis not present

## 2019-08-04 DIAGNOSIS — H04123 Dry eye syndrome of bilateral lacrimal glands: Secondary | ICD-10-CM | POA: Diagnosis not present

## 2019-08-12 DIAGNOSIS — N952 Postmenopausal atrophic vaginitis: Secondary | ICD-10-CM | POA: Diagnosis not present

## 2019-08-12 DIAGNOSIS — R3982 Chronic bladder pain: Secondary | ICD-10-CM | POA: Diagnosis not present

## 2019-08-15 ENCOUNTER — Other Ambulatory Visit: Payer: Self-pay | Admitting: Physician Assistant

## 2019-08-15 ENCOUNTER — Other Ambulatory Visit: Payer: Self-pay | Admitting: Internal Medicine

## 2019-08-15 DIAGNOSIS — K2100 Gastro-esophageal reflux disease with esophagitis, without bleeding: Secondary | ICD-10-CM

## 2019-09-01 ENCOUNTER — Other Ambulatory Visit: Payer: Self-pay | Admitting: Internal Medicine

## 2019-09-02 ENCOUNTER — Ambulatory Visit
Admission: RE | Admit: 2019-09-02 | Discharge: 2019-09-02 | Disposition: A | Discharge status: Home / Self Care | Payer: PPO | Source: Ambulatory Visit | Attending: Internal Medicine | Admitting: Internal Medicine

## 2019-09-02 ENCOUNTER — Other Ambulatory Visit: Payer: Self-pay

## 2019-09-02 DIAGNOSIS — Z1231 Encounter for screening mammogram for malignant neoplasm of breast: Secondary | ICD-10-CM | POA: Diagnosis not present

## 2019-09-15 ENCOUNTER — Other Ambulatory Visit: Payer: Self-pay | Admitting: Internal Medicine

## 2019-09-15 DIAGNOSIS — I10 Essential (primary) hypertension: Secondary | ICD-10-CM

## 2019-09-17 ENCOUNTER — Other Ambulatory Visit: Payer: Self-pay

## 2019-09-17 ENCOUNTER — Encounter: Payer: Self-pay | Admitting: Adult Health Nurse Practitioner

## 2019-09-17 ENCOUNTER — Ambulatory Visit (INDEPENDENT_AMBULATORY_CARE_PROVIDER_SITE_OTHER): Payer: PPO | Admitting: Adult Health Nurse Practitioner

## 2019-09-17 VITALS — BP 120/70 | HR 56 | Temp 97.0°F | Resp 16 | Ht 64.0 in | Wt 101.4 lb

## 2019-09-17 DIAGNOSIS — E441 Mild protein-calorie malnutrition: Secondary | ICD-10-CM | POA: Diagnosis not present

## 2019-09-17 DIAGNOSIS — K5790 Diverticulosis of intestine, part unspecified, without perforation or abscess without bleeding: Secondary | ICD-10-CM | POA: Diagnosis not present

## 2019-09-17 DIAGNOSIS — E559 Vitamin D deficiency, unspecified: Secondary | ICD-10-CM | POA: Diagnosis not present

## 2019-09-17 DIAGNOSIS — I4891 Unspecified atrial fibrillation: Secondary | ICD-10-CM

## 2019-09-17 DIAGNOSIS — K5792 Diverticulitis of intestine, part unspecified, without perforation or abscess without bleeding: Secondary | ICD-10-CM | POA: Diagnosis not present

## 2019-09-17 DIAGNOSIS — I10 Essential (primary) hypertension: Secondary | ICD-10-CM | POA: Diagnosis not present

## 2019-09-17 LAB — COMPLETE METABOLIC PANEL WITH GFR
AG Ratio: 2.2 (calc) (ref 1.0–2.5)
ALT: 10 U/L (ref 6–29)
AST: 18 U/L (ref 10–35)
Albumin: 4.6 g/dL (ref 3.6–5.1)
Alkaline phosphatase (APISO): 91 U/L (ref 37–153)
BUN: 12 mg/dL (ref 7–25)
CO2: 31 mmol/L (ref 20–32)
Calcium: 9.2 mg/dL (ref 8.6–10.4)
Chloride: 97 mmol/L — ABNORMAL LOW (ref 98–110)
Creat: 0.78 mg/dL (ref 0.60–0.88)
GFR, Est African American: 81 mL/min/{1.73_m2} (ref >=60)
GFR, Est Non African American: 70 mL/min/{1.73_m2} (ref >=60)
Globulin: 2.1 g/dL (calc) (ref 1.9–3.7)
Glucose, Bld: 92 mg/dL (ref 65–99)
Potassium: 4.4 mmol/L (ref 3.5–5.3)
Sodium: 136 mmol/L (ref 135–146)
Total Bilirubin: 0.7 mg/dL (ref 0.2–1.2)
Total Protein: 6.7 g/dL (ref 6.1–8.1)

## 2019-09-17 LAB — CBC WITH DIFFERENTIAL/PLATELET
Absolute Monocytes: 626 cells/uL (ref 200–950)
Basophils Absolute: 44 cells/uL (ref 0–200)
Basophils Relative: 0.5 %
Eosinophils Absolute: 44 cells/uL (ref 15–500)
Eosinophils Relative: 0.5 %
HCT: 35.3 % (ref 35.0–45.0)
Hemoglobin: 11.3 g/dL — ABNORMAL LOW (ref 11.7–15.5)
Lymphs Abs: 1409 cells/uL (ref 850–3900)
MCH: 29.3 pg (ref 27.0–33.0)
MCHC: 32 g/dL (ref 32.0–36.0)
MCV: 91.5 fL (ref 80.0–100.0)
MPV: 11.1 fL (ref 7.5–12.5)
Monocytes Relative: 7.2 %
Neutro Abs: 6577 cells/uL (ref 1500–7800)
Neutrophils Relative %: 75.6 %
Platelets: 215 10*3/uL (ref 140–400)
RBC: 3.86 10*6/uL (ref 3.80–5.10)
RDW: 12.8 % (ref 11.0–15.0)
Total Lymphocyte: 16.2 %
WBC: 8.7 10*3/uL (ref 3.8–10.8)

## 2019-09-17 NOTE — Patient Instructions (Addendum)
   Take Dicylomine (Bentyl) before meals.  This will help with cramping and bloating.  Consider adding Boost Breeze into you diet.  Drink this between meals.  Do not replace meals with this.  This is to increase your nutrition.       Increase water intake.  If this is making you feel bad then drink Gatorade sports drink or Pedialyte to prevent dehydration.  We will call you with lab results.

## 2019-09-17 NOTE — Progress Notes (Addendum)
. Assessment and Plan: Debra White was seen today for abdominal pain.  Diagnoses and all orders for this visit:  Diverticulosis Discussed Bentyl prior to meals to help with symptom management. Discussed diet at length Avoid triggers Increase in symptoms related to stress Cargiver to husband Continue to monitor stools -     CBC with Differential/Platelet -     COMPLETE METABOLIC PANEL WITH GFR  Mild malnutrition (HCC) Dicussed diet at length with patient, protein rich Discussed Boost Breeze, clear nutritional supplement rather than milk based. Drink between meals, not meal supplement. Increase water intake, concern for dehydration  Essential hypertension Continue current medications: olmesartan 40mg  Monitor blood pressure at home; call if consistently over 130/80 Continue DASH diet.   Reminder to go to the ER if any CP, SOB, nausea, dizziness, severe HA, changes vision/speech, left arm numbness and tingling and jaw pain.  Atrial fib w/controlled ventricular response (HCC) Taking Eliquis 5mg , half tablet BID Digoxin 0.125mg  and atenolol 100mg  daily Doing well No signs or Symptoms of bleeding Discussed hospital precautions     Further disposition pending results of labs. Discussed med's effects and SE's.   Over 30 minutes of exam, counseling, chart review, and critical decision making was performed.   Future Appointments  Date Time Provider [place]  10/06/2019  9:30 AM Vicie Mutters, PA-C [facility] None  10/09/2019 10:30 AM Erlene Quan, PA-C [facility]  12/15/2019  9:00 AM Unk Pinto, MD [facility] None  06/23/2020 10:30 AM Garnet Sierras, NP [facility] None    ------------------------------------------------------------------------------------------------------------------   HPI 86 y.o.female presents for evaluation of diarrhea.  It started last night with nausea and cramping.  Small amount of emesis.  She reports initially she was constipated.   She treated this with laxative, unsure of what it was.  Reports she then had at least 6 bowel movements.  Reports they were loose.  She reports small amount of bright red blood on tissue x1.  She has known hemorrhoids and believe this is what is causing it.  She has not had any further instances of blood. She was using zofran oDT and that helped some with nausea. She also has bentyl but has not been taking this regularly.  She took one with breakfast this am and reports this did help.  She denies further emesis, coughing with eating or drinking, stomach pains but endorses tenderness since bloating is resolving.  Denies dizziness, headaches, chest pains or shortness of breath.  She feels weak when trying to keep up with everything.  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  She reports she diet is poor.  She does not drink any nutritional supplements as milk based products irritate her bowels.    Past Medical History:  Diagnosis Date  . Acute on chronic combined systolic and diastolic CHF (congestive heart failure) ([place]) 06/29/2019  . Anxiety   . Arthritis   . Blood transfusion   . C. difficile colitis   . Candida esophagitis ([place]) 2013   EGD  . Cataract   . Chronic kidney disease    STONES  . Diverticulitis 04/05/2017  . Diverticulosis of colon (without mention of hemorrhage) 2007   Colonoscopy  . Dysrhythmia    RX  . Emphysema of lung ([place])   . Family history of colon cancer    sister  . Fibromyalgia   . Fracture, zygoma closed ([place]) 07/13/2011  . GERD (gastroesophageal reflux disease)   . Headache(784.0)   . Hyperlipemia   . Hypertension   . Kidney  stones   . Ptosis, bilateral   . 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 Latex Hives    TONGUE HAD BLISTERS WHEN HAD DENTAL SURGERY  . Amoxicillin Nausea And Vomiting  . Aspirin Other (See Comments)    Gi upset  . Barbiturates Other (See Comments)    Unknown reaction  . Celebrex [Celecoxib]  Other (See Comments)    Unknown reaction  . Codeine Itching  . Evista [Raloxifene] Other (See Comments)    Unknown reaction  . Lipitor [Atorvastatin] Other (See Comments)    Unknown reaction  . Morphine And Related Other (See Comments)    Unknown reaction  . Oruvail [Ketoprofen] Other (See Comments)    Unknown reaction  . Pantoprazole Diarrhea  . Paraffin Other (See Comments)    Unknown reaction  . Prochlorperazine Other (See Comments)    Uncontrolled shaking  . Proloprim [Trimethoprim] Other (See Comments)    Unknown reaction  . Venlafaxine Nausea Only  . Vibra-Tab [Doxycycline] Other (See Comments)    Unknown reaction  . Vioxx [Rofecoxib] Other (See Comments)    edema  . Betadine [Povidone Iodine] Rash  . Caffeine Palpitations    Current Outpatient Medications on File Prior to Visit  Medication Sig  . acetaminophen (TYLENOL) 500 MG tablet Take 1,000 mg by mouth at bedtime.   Marland Kitchen apixaban (ELIQUIS) 5 MG TABS tablet Take 1 tablet 2 x /day to Prevent Blood Clots (Patient taking differently: Take 2.5 mg by mouth 2 (two) times daily. )  . atenolol (TENORMIN) 100 MG tablet Take 1 tablet Daily for BP  . clonazePAM (KLONOPIN) 1 MG tablet Take 1/2 to 1 tablet 2 to 3 x / day  ONLY if needed for Anxiety Attack & please try to limit to 5 days /week to avoid Addiction & Dementia  . diclofenac Sodium (VOLTAREN) 1 % GEL Apply 2 grams 3 to 4 x / day as needed for Pain  . dicyclomine (BENTYL) 10 MG capsule Take 1 capsule (10 mg total) by mouth 3 (three) times daily before meals.  . dicyclomine (BENTYL) 20 MG tablet Take 1 tablet 4 x /day before Meals & Bedtime if needed for Nausea, Cramping, Bloating or Diarrhea  . digoxin (LANOXIN) 0.125 MG tablet Take 1 tablet Daily for fast / Irregular Heart Beat (Patient taking differently: Take 0.125 mg by mouth daily. )  . furosemide (LASIX) 40 MG tablet Take 1/2 to 1 tablet Daily if needed for Fluid Retention / Ankle swelling  . lovastatin (MEVACOR) 20 MG  tablet Take 1 tablet at Bedtime for Cholesterol (Patient taking differently: Take 20 mg by mouth at bedtime. )  . olmesartan (BENICAR) 40 MG tablet Take 1 tablet Daily for BP  . omeprazole (PRILOSEC) 40 MG capsule Take 1 capsule Daily for Indigestion & Heartburn  . phenazopyridine (PYRIDIUM) 100 MG tablet Take 1 tablet (100 mg total) by mouth 3 (three) times daily as needed for pain.  Marland Kitchen VITAMIN D PO Take 5,000 Units by mouth daily.  Marland Kitchen gabapentin (NEURONTIN) 600 MG tablet Take 1/2 to 1 tablet s 3 to 4 x / day as needed for Facial Pain (Patient taking differently: Take 300 mg by mouth 3 (three) times daily. )  . [DISCONTINUED] omeprazole (PRILOSEC OTC) 20 MG tablet Take 20 mg by mouth daily.   No current facility-administered medications on file prior to visit.    ROS: all negative except above.   Physical Exam:  BP 120/70   Pulse (!) 56   Temp (!) 97 F (36.1 C)   Resp 16   Ht 5\' 4"  (1.626 m)   Wt 101 lb 6.4 oz (46 kg)   BMI 17.41 kg/m   General Appearance: Thin, frail, in no apparent distress. Eyes: PERRLA, EOMs, conjunctiva no swelling or erythema Sinuses: No Frontal/maxillary tenderness ENT/Mouth: Ext aud canals clear, TMs without erythema, bulging. No erythema, swelling, or exudate on post pharynx.  Tonsils not swollen or erythematous. Hard of hearing. Neck: Supple, thyroid normal.  Respiratory: Respiratory effort normal, BS equal bilaterally without rales, rhonchi, wheezing or stridor.  Cardio: RRR with no MRGs. Brisk peripheral pulses without edema.  Abdomen: Soft, + BS, hyperactive all quadtrants.  mild tenderness generalized, no guarding, rebound, hernias, masses. Lymphatics: Non tender without lymphadenopathy.  Musculoskeletal: Full ROM, 5/5 strength, normal gait, slow.  Skin: Warm, dry without rashes, lesions, ecchymosis.  Neuro: Cranial nerves intact. Normal muscle tone, no cerebellar symptoms. Sensation intact.  Psych: Awake and oriented X 3, normal affect, Insight and  Judgment appropriate.     Garnet Sierras, NP 12:40 PM [facility]

## 2019-09-21 NOTE — Progress Notes (Signed)
Patient is aware of lab results. She is taking her Bentyl and notices that she has a better appetite and is no longer nauseous, however; she states that when she takes the medication, that she is really dizzy and fatigued. Patient also states that she is drinking lots of water. -Marcelino Scot

## 2019-10-05 NOTE — Progress Notes (Signed)
MEDICARE ANNUAL WELLNESS VISIT AND 3 MONTH FOLLOW UP  Assessment:   Weight loss -     TSH -     mirtazapine (REMERON) 15 MG tablet; 1/2-1 tablet at night for sleep, anxiety, headache, and to help increase weight. 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   Renal infarct ([place]) Continue on elliquis  Atrial fibrillation with controlled ventricular response ([place]) Continue elliquis, she is rate controlled, continue follow up cardio  Aortic atherosclerosis (HCC) Control blood pressure, cholesterol, glucose, increase exercise.   Chronic diastolic congestive heart failure (HCC) Monitor, no symptoms of fluid overload at this time, patient having weight loss, no swelling, no PND, orthopenia  Pulmonary emphysema, unspecified emphysema type (HCC) Monitor  Mild malnutrition (HCC) -     COMPLETE METABOLIC PANEL WITH GFR - given ensure, will check labs - will refer to GI to rule out malignancy/dysphagia  Essential hypertension -     CBC with Differential/Platelet -     COMPLETE METABOLIC PANEL WITH GFR -     TSH - continue medications, DASH diet, exercise and monitor at home. Call if greater than 130/80.   Coronary atherosclerosis due to lipid rich plaque Control blood pressure, cholesterol, glucose, increase exercise.   Vitamin D deficiency -     VITAMIN D 25 Hydroxy (Vit-D Deficiency, Fractures)  Medication  management -     Magnesium  Mixed hyperlipidemia -     Lipid panel check lipids decrease fatty foods increase activity.   History of embolic stroke Control blood pressure, cholesterol, glucose, increase exercise.   Chronic post-traumatic headache, not intractable Continue klonopin, try to limit use Add on remeron and see if this helps anxiety/eating/headache- start very low dose  Atypical facial pain -     mirtazapine (REMERON) 15 MG tablet; 1/2-1 tablet at night for sleep, anxiety, headache, and to help increase weight.  Hepatic steatosis Check labs, avoid tylenol, alcohol   Gastroesophageal reflux disease, unspecified whether esophagitis present Continue PPI/H2 blocker, diet discussed  Osteoporosis, unspecified osteoporosis type, unspecified pathological fracture presence Monitor  B12 deficiency -     Vitamin B12  Screening, anemia, deficiency, iron -     Iron,Total/Total Iron Binding Cap  Abnormal glucose -     Hemoglobin A1c   Over 40 minutes of exam, counseling, chart review and critical decision making was performed Future Appointments  Date Time Provider [place]  10/09/2019 10:45 AM Erlene Quan, PA-C [facility]  12/15/2019  9:00 AM Unk Pinto, MD [facility] None  06/23/2020 10:30 AM Garnet Sierras, NP [facility] None     Plan:   During the course of the visit the patient was educated and counseled about appropriate screening and preventive services including:    Pneumococcal vaccine   Prevnar 13  Influenza vaccine  Td vaccine  Screening electrocardiogram  Bone densitometry screening  Colorectal cancer screening  Diabetes screening  Glaucoma screening  Nutrition counseling   Advanced directives: requested   Subjective:  Debra White is a 86 y.o. female who presents for Medicare Annual Wellness Vist and 3 Month follow up.   She had splenic and renal infarcts in 2018, history of CVA and on Eliquis  for Afib.  Echo 2019 EF 55-60% with severe LAE.  Declines TEE and loop recorder. Has seen Dr. Oval Linsey.   Husband has dementia, he is doing PT and working on strength.   She continue to have headaches and she states she feels weak. She is unintentionally losing weight.  She still has a good appetite but she is stressed from having to take care of her husband, they will have been married for 65 years this year.  She states sometimes water will get caught in her throat, denies pills or food getting caught.  She has to take the dicyclomine only at night.  She has had a recent CT AB for gross hematuria, was negative.  Last colonoscopy: 2013 Last mammogram: 08/2019 BMI is Body mass index is 16.92 kg/m., she is working on diet and exercise. No coughing, no wheezing, some SOB with carrying groceries in. Follows with cardiologist soon for abnormal CT/CXR.  Wt Readings from Last 3 Encounters:  10/06/19 98 lb 9.6 oz (44.7 kg)  09/17/19 101 lb 6.4 oz (46 kg)  07/14/19 108 lb 6.4 oz (49.2 kg)   She has had elevated blood pressure for over 30 years. Her blood pressure has been controlled at home, today their BP is BP: 140/72 She does not workout. She denies chest pain, shortness of breath, dizziness.  She is not on cholesterol medication at this time.  Her cholesterol is not at goal. The cholesterol last visit was:   Lab Results  Component Value Date   CHOL 147 06/16/2019   HDL 74 06/16/2019   LDLCALC 50 06/16/2019   TRIG 144 06/16/2019   CHOLHDL 2.0 06/16/2019    She has prediabetes.  has not been working on diet and exercise for prediabetes, and denies hyperglycemia, hypoglycemia , nausea, polydipsia, polyuria and visual disturbances. Last A1C in the office was:  Lab Results  Component Value Date   HGBA1C 5.7 (H) 06/16/2019   Last GFR: Lab Results  Component Value Date   GFRNONAA 70 09/17/2019   Patient is on Vitamin D supplement.   Lab Results  Component Value Date   VD25OH 61  03/12/2019    Medication Review:   Current Outpatient Medications (Cardiovascular):  .  atenolol (TENORMIN) 100 MG tablet, Take 1 tablet Daily for BP .  digoxin (LANOXIN) 0.125 MG tablet, Take 1 tablet Daily for fast / Irregular Heart Beat (Patient taking differently: Take 0.125 mg by mouth daily. ) .  furosemide (LASIX) 40 MG tablet, Take 1/2 to 1 tablet Daily if needed for Fluid Retention / Ankle swelling .  lovastatin (MEVACOR) 20 MG tablet, Take 1 tablet at Bedtime for Cholesterol (Patient taking differently: Take 20 mg by mouth at bedtime. ) .  olmesartan (BENICAR) 40 MG tablet, Take 1 tablet Daily for BP   Current Outpatient Medications (Analgesics):  .  acetaminophen (TYLENOL) 500 MG tablet, Take 1,000 mg by mouth at bedtime.   Current Outpatient Medications (Hematological):  .  apixaban (ELIQUIS) 5 MG TABS tablet, Take 1 tablet 2 x /day to Prevent Blood Clots (Patient taking differently: Take 2.5 mg by mouth 2 (two) times daily. )  Current Outpatient Medications (Other):  .  clonazePAM (KLONOPIN) 1 MG tablet, Take 1/2  to 1 tablet 2 to 3 x / day  ONLY if needed for Anxiety Attack & please try to limit to 5 days /week to avoid Addiction & Dementia .  diclofenac Sodium (VOLTAREN) 1 % GEL, Apply 2 grams 3 to 4 x / day as needed for Pain .  dicyclomine (BENTYL) 20 MG tablet, Take 1 tablet 4 x /day before Meals & Bedtime if needed for Nausea, Cramping, Bloating or Diarrhea .  omeprazole (PRILOSEC) 40 MG capsule, Take 1 capsule Daily for Indigestion & Heartburn .  phenazopyridine (PYRIDIUM) 100 MG tablet, Take 1 tablet (100 mg total) by mouth 3 (three) times daily as needed for pain. Marland Kitchen  VITAMIN D PO, Take 5,000 Units by mouth daily. Marland Kitchen  gabapentin (NEURONTIN) 600 MG tablet, Take 1/2 to 1 tablet s 3 to 4 x / day as needed for Facial Pain (Patient taking differently: Take 300 mg by mouth 3 (three) times daily. )   Allergies  Allergen Reactions  . Latex Hives    TONGUE HAD BLISTERS WHEN  HAD DENTAL SURGERY  . Amoxicillin Nausea And Vomiting  . Aspirin Other (See Comments)    Gi upset  . Barbiturates Other (See Comments)    Unknown reaction  . Celebrex [Celecoxib] Other (See Comments)    Unknown reaction  . Codeine Itching  . Evista [Raloxifene] Other (See Comments)    Unknown reaction  . Lipitor [Atorvastatin] Other (See Comments)    Unknown reaction  . Morphine And Related Other (See Comments)    Unknown reaction  . Oruvail [Ketoprofen] Other (See Comments)    Unknown reaction  . Pantoprazole Diarrhea  . Paraffin Other (See Comments)    Unknown reaction  . Prochlorperazine Other (See Comments)    Uncontrolled shaking  . Proloprim [Trimethoprim] Other (See Comments)    Unknown reaction  . Venlafaxine Nausea Only  . Vibra-Tab [Doxycycline] Other (See Comments)    Unknown reaction  . Vioxx [Rofecoxib] Other (See Comments)    edema  . Betadine [Povidone Iodine] Rash  . Caffeine Palpitations    Current Problems (verified) Patient Active Problem List   Diagnosis Date Noted  . Diastolic CHF ([place]) XX123456  . History of embolic stroke XX123456  . Atrial fibrillation with controlled ventricular response ([place]) 03/12/2019  . Coronary atherosclerosis due to lipid rich plaque 03/26/2018  . Renal infarct ([place]) 03/22/2018  . Chronic hyponatremia 03/22/2018  . Chronic hyperkalemia 03/22/2018  . Hepatic steatosis 11/17/2017  . Aortic atherosclerosis ([place]) 08/08/2017  . COPD (chronic obstructive pulmonary disease) with emphysema ([place]) 12/21/2015  . 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 Osteoporosis 12/21/2015  . Mild malnutrition ([place]) 05/11/2015  . Encounter for Medicare annual wellness exam 04/08/2015  . Medication management 01/27/2014  . 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 Vitamin D deficiency 07/08/2013  . Hypertension   . Hyperlipidemia   . GERD   . Headache, post-traumatic, chronic 09/20/2011    Screening Tests Immunization History  Administered  Date(s) Administered  . Influenza, High Dose Seasonal PF 03/25/2014, 04/05/2016, 06/02/2018, 03/12/2019  . Influenza-Unspecified 03/09/2013, 03/11/2015  . Pneumococcal Conjugate-13 12/21/2015  . Pneumococcal Polysaccharide-23 06/25/2002  . Td 06/26/2003    Preventative care: Last colonoscopy: 2013 EGD 2013 Last mammogram: 08/2019 CT head 05/2019 CT renal stone AB 05/2019 CTA Ab 04/2018 CXR 05/2019: IMPRESSION: 1. Cardiomegaly with mild interstitial thickening most prominent in the lung bases, similar to the prior chest radiographs. Current chest CT lung bases suggested congestive heart failure with interstitial edema, which is supported  on the current chest radiograph. No evidence of pneumonia.  DEXA: 2017  Prior vaccinations: TD or Tdap: 2015, DUE Declines  Influenza: 2020  Pneumococcal: 2004 Prevnar13: 2017 Shingles/Zostavax: N/A  Names of Other Physician/Practitioners you currently use: 1.  [facility] here for primary care 2.  Eye Exam 2019 3. Dentist Feb 2020  Patient Care Team: Unk Pinto, MD as PCP - General (Internal Medicine) Skeet Latch, MD as PCP - Cardiology (Cardiology) Darleen Crocker, MD as Consulting Physician (Ophthalmology) Ladene Artist, MD as Consulting Physician (Gastroenterology) Cameron Sprang, MD as Consulting Physician (Neurology) Jodi Marble, MD as Consulting Physician (Otolaryngology)  SURGICAL HISTORY She  has a past surgical history that includes Abdominal hysterectomy; Hand surgery; Shoulder arthroscopy w/ rotator cuff repair; Cervical disc surgery; Back surgery; ORIF tripod fracture (07/13/2011); Colonoscopy; Facial fracture surgery; Back surgery; Ptosis repair (Bilateral, 11/22/2016); and Breast excisional biopsy. FAMILY HISTORY Her family history includes Breast cancer (age of onset: 20) in her sister; Cancer in her sister, sister, and sister; Colon cancer in her sister; Heart attack in  her father; Heart disease in her father, mother, and sister; Hyperlipidemia in her daughter; Hypertension in her daughter. SOCIAL HISTORY She  reports that she has never smoked. She has never used smokeless tobacco. She reports that she does not drink alcohol or use drugs.   MEDICARE WELLNESS OBJECTIVES: Physical activity: Current Exercise Habits: The patient does not participate in regular exercise at present(takes care of her husband, helps him dress, does his meds) Cardiac risk factors:   Depression/mood screen:   Depression screen [facility] 2/9 10/06/2019  Decreased Interest 1  Down, Depressed, Hopeless 0  PHQ - 2 Score 1  Altered sleeping -  Tired, decreased energy -  Change in appetite -  Feeling bad or failure about yourself  -  Trouble concentrating -  Moving slowly or fidgety/restless -  Suicidal thoughts -  PHQ-9 Score -  Some recent data might be hidden    ADLs:  In your present state of health, do you have any difficulty performing the following activities: 10/06/2019 06/16/2019  Hearing? Y [place]? N N  Difficulty concentrating or making decisions? Y N  Comment - -  Walking or climbing stairs? N N  Dressing or bathing? N N  Doing errands, shopping? N N  Comment does not drive on highway due to neck fusion and not at night -  Preparing Food and eating ? N N  Using the Toilet? N N  In the past six months, have you accidently leaked urine? N N  Do you have problems with loss of bowel control? N N  Managing your Medications? N N  Managing your Finances? N N  Housekeeping or managing your Housekeeping? Y N  Comment due to shoulders -  Some recent data might be hidden     Cognitive Testing  Alert? Yes  Normal Appearance?Yes  Oriented to person? Yes  Place? Yes   Time? Yes  Recall of three objects?  Yes  Can perform simple calculations? Yes  Displays appropriate judgment?Yes  Can read the correct time from a watch face?Yes  EOL planning: Does Patient  Have a Medical Advance Directive?: Yes Type of Advance Directive: Living will Does patient want to make changes to medical advance directive?: No - Patient declined  Yes, living will, request copy  Review of Systems  Constitutional: Positive for malaise/fatigue and weight loss. Negative for chills, diaphoresis and fever.  HENT:  Negative for congestion, ear discharge, ear pain, hearing loss, nosebleeds, sinus pain, sore throat and tinnitus.   Eyes: Negative for blurred vision, double vision, photophobia, pain, discharge and redness.  Respiratory: Negative for cough, hemoptysis, sputum production, shortness of breath, wheezing and stridor.   Cardiovascular: Negative for chest pain, palpitations, orthopnea, claudication, leg swelling and PND.  Gastrointestinal: Positive for abdominal pain and nausea. Negative for blood in stool, constipation, diarrhea, heartburn, melena and vomiting.  Genitourinary: Negative for dysuria, flank pain, frequency, hematuria and urgency.  Musculoskeletal: Positive for back pain, joint pain and myalgias. Negative for falls and neck pain.  Skin: Negative for itching and rash.  Neurological: Negative for dizziness, tingling, tremors, sensory change, speech change, focal weakness, seizures, loss of consciousness, weakness and headaches.  Endo/Heme/Allergies: Negative for environmental allergies and polydipsia. Does not bruise/bleed easily.  Psychiatric/Behavioral: Negative for depression, hallucinations, memory loss, substance abuse and suicidal ideas. The patient is nervous/anxious. The patient does not have insomnia.      Objective:     Today's Vitals   10/06/19 1005  BP: 140/72  Pulse: 69  Temp: 97.6 F (36.4 C)  SpO2: 99%  Weight: 98 lb 9.6 oz (44.7 kg)   Body mass index is 16.92 kg/m.  General appearance: alert, no distress, WD/WN, female HEENT: normocephalic, sclerae anicteric, TMs pearly right, left cerumen impaction, nares patent, no discharge or  erythema, pharynx normal Oral cavity: MMM, no lesions Neck: supple, no lymphadenopathy, no thyromegaly, no masses Heart: RRR, normal S1, S2, no murmurs Lungs: CTA bilaterally, no wheezes, rhonchi, or rales Abdomen: +bs, soft, diffuse tenderness, non distended, no masses, no hepatomegaly, no splenomegaly Musculoskeletal: nontender, no swelling, no obvious deformity Extremities: no edema, no cyanosis, no clubbing Pulses: 2+ symmetric, upper and lower extremities, normal cap refill Neurological: alert, oriented x 3, CN2-12 intact, strength normal upper extremities and lower extremities, sensation normal throughout, DTRs 2+ throughout, no cerebellar signs, gait normal Psychiatric: normal affect, behavior normal, pleasant   Medicare Attestation I have personally reviewed: The patient's medical and social history Their use of alcohol, tobacco or illicit drugs Their current medications and supplements The patient's functional ability including ADLs,fall risks, home safety risks, cognitive, and hearing and visual impairment Diet and physical activities Evidence for depression or mood disorders  The patient's weight, height, BMI, and visual acuity have been recorded in the chart.  I have made referrals, counseling, and provided education to the patient based on review of the above and I have provided the patient with a written personalized care plan for preventive services.     Vicie Mutters, PA-C   06/16/2019

## 2019-10-06 ENCOUNTER — Ambulatory Visit (INDEPENDENT_AMBULATORY_CARE_PROVIDER_SITE_OTHER): Payer: PPO | Admitting: Physician Assistant

## 2019-10-06 ENCOUNTER — Encounter: Payer: Self-pay | Admitting: Physician Assistant

## 2019-10-06 ENCOUNTER — Other Ambulatory Visit: Payer: Self-pay

## 2019-10-06 VITALS — BP 140/72 | HR 69 | Temp 97.6°F | Wt 98.6 lb

## 2019-10-06 DIAGNOSIS — I4891 Unspecified atrial fibrillation: Secondary | ICD-10-CM | POA: Diagnosis not present

## 2019-10-06 DIAGNOSIS — Z79899 Other long term (current) drug therapy: Secondary | ICD-10-CM | POA: Diagnosis not present

## 2019-10-06 DIAGNOSIS — Z0001 Encounter for general adult medical examination with abnormal findings: Secondary | ICD-10-CM | POA: Diagnosis not present

## 2019-10-06 DIAGNOSIS — I700 Atherosclerosis of aorta: Secondary | ICD-10-CM

## 2019-10-06 DIAGNOSIS — M810 Age-related osteoporosis without current pathological fracture: Secondary | ICD-10-CM

## 2019-10-06 DIAGNOSIS — J439 Emphysema, unspecified: Secondary | ICD-10-CM | POA: Diagnosis not present

## 2019-10-06 DIAGNOSIS — E538 Deficiency of other specified B group vitamins: Secondary | ICD-10-CM

## 2019-10-06 DIAGNOSIS — I10 Essential (primary) hypertension: Secondary | ICD-10-CM | POA: Diagnosis not present

## 2019-10-06 DIAGNOSIS — I771 Stricture of artery: Secondary | ICD-10-CM

## 2019-10-06 DIAGNOSIS — G501 Atypical facial pain: Secondary | ICD-10-CM

## 2019-10-06 DIAGNOSIS — Z8673 Personal history of transient ischemic attack (TIA), and cerebral infarction without residual deficits: Secondary | ICD-10-CM

## 2019-10-06 DIAGNOSIS — N280 Ischemia and infarction of kidney: Secondary | ICD-10-CM | POA: Diagnosis not present

## 2019-10-06 DIAGNOSIS — R1310 Dysphagia, unspecified: Secondary | ICD-10-CM

## 2019-10-06 DIAGNOSIS — I5032 Chronic diastolic (congestive) heart failure: Secondary | ICD-10-CM | POA: Diagnosis not present

## 2019-10-06 DIAGNOSIS — R6889 Other general symptoms and signs: Secondary | ICD-10-CM

## 2019-10-06 DIAGNOSIS — M2550 Pain in unspecified joint: Secondary | ICD-10-CM

## 2019-10-06 DIAGNOSIS — G44329 Chronic post-traumatic headache, not intractable: Secondary | ICD-10-CM

## 2019-10-06 DIAGNOSIS — Z0000 Encounter for general adult medical examination without abnormal findings: Secondary | ICD-10-CM

## 2019-10-06 DIAGNOSIS — E441 Mild protein-calorie malnutrition: Secondary | ICD-10-CM | POA: Diagnosis not present

## 2019-10-06 DIAGNOSIS — R7309 Other abnormal glucose: Secondary | ICD-10-CM

## 2019-10-06 DIAGNOSIS — E782 Mixed hyperlipidemia: Secondary | ICD-10-CM | POA: Diagnosis not present

## 2019-10-06 DIAGNOSIS — I2510 Atherosclerotic heart disease of native coronary artery without angina pectoris: Secondary | ICD-10-CM | POA: Diagnosis not present

## 2019-10-06 DIAGNOSIS — E559 Vitamin D deficiency, unspecified: Secondary | ICD-10-CM | POA: Diagnosis not present

## 2019-10-06 DIAGNOSIS — Z130 Encounter for screening for diseases of the blood and blood-forming organs and certain disorders involving the immune mechanism: Secondary | ICD-10-CM | POA: Diagnosis not present

## 2019-10-06 DIAGNOSIS — K219 Gastro-esophageal reflux disease without esophagitis: Secondary | ICD-10-CM

## 2019-10-06 DIAGNOSIS — R634 Abnormal weight loss: Secondary | ICD-10-CM | POA: Diagnosis not present

## 2019-10-06 DIAGNOSIS — K760 Fatty (change of) liver, not elsewhere classified: Secondary | ICD-10-CM

## 2019-10-06 DIAGNOSIS — I2583 Coronary atherosclerosis due to lipid rich plaque: Secondary | ICD-10-CM

## 2019-10-06 MED ORDER — MIRTAZAPINE 15 MG PO TABS: ORAL_TABLET | ORAL | 2 refills | Status: DC

## 2019-10-06 NOTE — Patient Instructions (Addendum)
Will give you ensure/boost to help with your weight Please take 1-2 a day  Start the mirtazipine 1/2 pill at night for 7 nights and can increase to 1 pill at night- this is for anxiety, sleep and may help with the headache.   Stop if you get dizzy or stay very tired in the morning please stop the medication.   We will send you to GI for evaluation of your weight loss  We are checking some labs on you to see if you have inflammation.     WEIGHT GAIN  Try to make sure you are eating plenty of high calorie dense foods like: Avocado Nuts Peanut butter Oatmeal Dates Cottage cheese Greek yogurt Protein powder

## 2019-10-07 LAB — COMPLETE METABOLIC PANEL WITH GFR
AG Ratio: 2 (calc) (ref 1.0–2.5)
ALT: 12 U/L (ref 6–29)
AST: 20 U/L (ref 10–35)
Albumin: 5.1 g/dL (ref 3.6–5.1)
Alkaline phosphatase (APISO): 109 U/L (ref 37–153)
BUN: 12 mg/dL (ref 7–25)
CO2: 31 mmol/L (ref 20–32)
Calcium: 9.7 mg/dL (ref 8.6–10.4)
Chloride: 92 mmol/L — ABNORMAL LOW (ref 98–110)
Creat: 0.79 mg/dL (ref 0.60–0.88)
GFR, Est African American: 80 mL/min/{1.73_m2} (ref >=60)
GFR, Est Non African American: 69 mL/min/{1.73_m2} (ref >=60)
Globulin: 2.5 g/dL (calc) (ref 1.9–3.7)
Glucose, Bld: 96 mg/dL (ref 65–99)
Potassium: 4.2 mmol/L (ref 3.5–5.3)
Sodium: 133 mmol/L — ABNORMAL LOW (ref 135–146)
Total Bilirubin: 0.7 mg/dL (ref 0.2–1.2)
Total Protein: 7.6 g/dL (ref 6.1–8.1)

## 2019-10-07 LAB — CBC WITH DIFFERENTIAL/PLATELET
Absolute Monocytes: 595 cells/uL (ref 200–950)
Basophils Absolute: 38 cells/uL (ref 0–200)
Basophils Relative: 0.6 %
Eosinophils Absolute: 19 cells/uL (ref 15–500)
Eosinophils Relative: 0.3 %
HCT: 36.9 % (ref 35.0–45.0)
Hemoglobin: 11.9 g/dL (ref 11.7–15.5)
Lymphs Abs: 1363 cells/uL (ref 850–3900)
MCH: 29.3 pg (ref 27.0–33.0)
MCHC: 32.2 g/dL (ref 32.0–36.0)
MCV: 90.9 fL (ref 80.0–100.0)
MPV: 11.2 fL (ref 7.5–12.5)
Monocytes Relative: 9.3 %
Neutro Abs: 4384 cells/uL (ref 1500–7800)
Neutrophils Relative %: 68.5 %
Platelets: 234 10*3/uL (ref 140–400)
RBC: 4.06 10*6/uL (ref 3.80–5.10)
RDW: 12.9 % (ref 11.0–15.0)
Total Lymphocyte: 21.3 %
WBC: 6.4 10*3/uL (ref 3.8–10.8)

## 2019-10-07 LAB — IRON, TOTAL/TOTAL IRON BINDING CAP
%SAT: 16 % (calc) (ref 16–45)
Iron: 77 ug/dL (ref 45–160)
TIBC: 490 mcg/dL (calc) — ABNORMAL HIGH (ref 250–450)

## 2019-10-07 LAB — LIPID PANEL
Cholesterol: 162 mg/dL (ref <200)
HDL: 93 mg/dL (ref >=50)
LDL Cholesterol (Calc): 52 mg/dL (calc)
Non-HDL Cholesterol (Calc): 69 mg/dL (calc) (ref <130)
Total CHOL/HDL Ratio: 1.7 (calc) (ref <5.0)
Triglycerides: 91 mg/dL (ref <150)

## 2019-10-07 LAB — C-REACTIVE PROTEIN: CRP: 2.8 mg/L (ref <8.0)

## 2019-10-07 LAB — MAGNESIUM: Magnesium: 1.8 mg/dL (ref 1.5–2.5)

## 2019-10-07 LAB — HEMOGLOBIN A1C
Hgb A1c MFr Bld: 5.7 % of total Hgb — ABNORMAL HIGH (ref <5.7)
Mean Plasma Glucose: 117 (calc)
eAG (mmol/L): 6.5 (calc)

## 2019-10-07 LAB — VITAMIN D 25 HYDROXY (VIT D DEFICIENCY, FRACTURES): Vit D, 25-Hydroxy: 93 ng/mL (ref 30–100)

## 2019-10-07 LAB — TSH: TSH: 0.61 mIU/L (ref 0.40–4.50)

## 2019-10-07 LAB — SEDIMENTATION RATE: Sed Rate: 2 mm/h (ref 0–30)

## 2019-10-07 LAB — VITAMIN B12: Vitamin B-12: 402 pg/mL (ref 200–1100)

## 2019-10-09 ENCOUNTER — Ambulatory Visit: Payer: PPO | Admitting: Cardiology

## 2019-10-09 ENCOUNTER — Other Ambulatory Visit: Payer: Self-pay

## 2019-10-09 VITALS — BP 143/67 | HR 52 | Ht 64.0 in | Wt 100.0 lb

## 2019-10-09 DIAGNOSIS — I4891 Unspecified atrial fibrillation: Secondary | ICD-10-CM | POA: Diagnosis not present

## 2019-10-09 DIAGNOSIS — Z8673 Personal history of transient ischemic attack (TIA), and cerebral infarction without residual deficits: Secondary | ICD-10-CM

## 2019-10-09 DIAGNOSIS — I10 Essential (primary) hypertension: Secondary | ICD-10-CM

## 2019-10-09 MED ORDER — ATENOLOL 100 MG PO TABS: 50.0000 mg | ORAL_TABLET | Freq: Every day | ORAL | 0 refills | Status: DC

## 2019-10-09 NOTE — Progress Notes (Signed)
Cardiology Office Note:    Date:  10/09/2019   ID:  Debra White, DOB 1934-02-19, MRN [id number]  PCP:  Unk Pinto, MD  Cardiologist:  Skeet Latch, MD  Electrophysiologist:  None   Referring MD: Unk Pinto, MD   No chief complaint on file. Weakness  History of Present Illness:    Debra White is a 86 y.o. female with a hx of atrial fibrillation, prior CVA, hypertension, COPD, anxiety, on chronic anticoagulation.  Patient weighs just 100 pounds.  She is on low-dose Eliquis.  I saw her in Jan 2021.  At that time the plan was to continue medical therapy.  She returns today for follow-up.  Since I saw her last she has been stable although she complains of weakness.  She is not had syncope.  EKG in the office today shows atrial fibrillation with a heart rate of 44.  I suggested we cut back on her beta-blocker and stop her Lanoxin.  The patient tells me this is been tried in the past and that she cannot tolerate it when her heart rate is faster.  Past Medical History:  Diagnosis Date  . Acute on chronic combined systolic and diastolic CHF (congestive heart failure) ([place]) 06/29/2019  . Anxiety   . Arthritis   . Blood transfusion   . C. difficile colitis   . Candida esophagitis ([place]) 2013   EGD  . Cataract   . Chronic kidney disease    STONES  . Diverticulitis 04/05/2017  . Diverticulosis of colon (without mention of hemorrhage) 2007   Colonoscopy  . Dysrhythmia    RX  . Emphysema of lung ([place])   . Family history of colon cancer    sister  . Fibromyalgia   . Fracture, zygoma closed ([place]) 07/13/2011  . GERD (gastroesophageal reflux disease)   . Headache(784.0)   . Hyperlipemia   . Hypertension   . Kidney stones   . Ptosis, bilateral   . Renal infarct ([place])   . Splenic infarct     Past Surgical History:  Procedure Laterality Date  . ABDOMINAL HYSTERECTOMY    . BACK SURGERY     X2  . BACK SURGERY    . BREAST EXCISIONAL BIOPSY    . CERVICAL DISC  SURGERY    . COLONOSCOPY    . FACIAL FRACTURE SURGERY    . HAND SURGERY     BIL   . ORIF TRIPOD FRACTURE  07/13/2011   Procedure: OPEN REDUCTION INTERNAL FIXATION (ORIF) TRIPOD FRACTURE;  Surgeon: Tyson Alias, MD;  Location: [facility];  Service: ENT;  Laterality: Right;  ORIF RIGHT ZYGOMA, ORBITAL FLOOR EXPLORATION WITH FROST STITCH (TEMPORARY TARSORRHAPHY)  . PTOSIS REPAIR Bilateral 11/22/2016   Procedure: INTERNAL PTOSIS REPAIR;  Surgeon: Clista Bernhardt, MD;  Location: [facility];  Service: Ophthalmology;  Laterality: Bilateral;  . SHOULDER ARTHROSCOPY W/ ROTATOR CUFF REPAIR     LFT    Current Medications: Current Meds  Medication Sig  . acetaminophen (TYLENOL) 500 MG tablet Take 1,000 mg by mouth at bedtime.   Marland Kitchen apixaban (ELIQUIS) 5 MG TABS tablet Take 1 tablet 2 x /day to Prevent Blood Clots (Patient taking differently: Take 2.5 mg by mouth 2 (two) times daily. )  . atenolol (TENORMIN) 100 MG tablet Take 0.5 tablets (50 mg total) by mouth daily.  . clonazePAM (KLONOPIN) 1 MG tablet Take 1/2 to 1 tablet 2 to 3 x / day  ONLY if needed for Anxiety Attack & please try  to limit to 5 days /week to avoid Addiction & Dementia  . diclofenac Sodium (VOLTAREN) 1 % GEL Apply 2 grams 3 to 4 x / day as needed for Pain  . dicyclomine (BENTYL) 20 MG tablet Take 1 tablet 4 x /day before Meals & Bedtime if needed for Nausea, Cramping, Bloating or Diarrhea  . furosemide (LASIX) 40 MG tablet Take 1/2 to 1 tablet Daily if needed for Fluid Retention / Ankle swelling  . lovastatin (MEVACOR) 20 MG tablet Take 1 tablet at Bedtime for Cholesterol (Patient taking differently: Take 20 mg by mouth at bedtime. )  . mirtazapine (REMERON) 15 MG tablet 1/2-1 tablet at night for sleep, anxiety, headache, and to help increase weight.  . olmesartan (BENICAR) 40 MG tablet Take 1 tablet Daily for BP  . omeprazole (PRILOSEC) 40 MG capsule Take 1 capsule Daily for Indigestion & Heartburn  . phenazopyridine (PYRIDIUM) 100 MG  tablet Take 1 tablet (100 mg total) by mouth 3 (three) times daily as needed for pain.  Marland Kitchen VITAMIN D PO Take 5,000 Units by mouth daily.  . [DISCONTINUED] atenolol (TENORMIN) 100 MG tablet Take 1 tablet Daily for BP  . [DISCONTINUED] digoxin (LANOXIN) 0.125 MG tablet Take 1 tablet Daily for fast / Irregular Heart Beat (Patient taking differently: Take 0.125 mg by mouth daily. )     Allergies:   Latex, Amoxicillin, Aspirin, Barbiturates, Celebrex [celecoxib], Codeine, Evista [raloxifene], Lipitor [atorvastatin], Morphine and related, Oruvail [ketoprofen], Pantoprazole, Paraffin, Prochlorperazine, Proloprim [trimethoprim], Venlafaxine, Vibra-tab [doxycycline], Vioxx [rofecoxib], Betadine [povidone iodine], and Caffeine   Social History   Socioeconomic History  . Marital status: Married    Spouse name: Not on file  . Number of children: 1  . Years of education: 55  . Highest education level: Not on file  Occupational History  . Occupation: Retired    Fish farm manager: RETIRED  Tobacco Use  . Smoking status: Never Smoker  . Smokeless tobacco: Never Used  Substance and Sexual Activity  . Alcohol use: No  . Drug use: No  . Sexual activity: Not on file    Comment: HYSTER  Other Topics Concern  . Not on file  Social History Narrative   [place].   Right-handed.   No caffeine use.   Social Determinants of Health   Financial Resource Strain:   . Difficulty of Paying Living Expenses:   Food Insecurity:   . Worried About Charity fundraiser in the Last Year:   . Arboriculturist in the Last Year:   Transportation Needs:   . Film/video editor (Medical):   Marland Kitchen Lack of Transportation (Non-Medical):   Physical Activity:   . Days of Exercise per Week:   . Minutes of Exercise per Session:   Stress:   . Feeling of Stress :   Social Connections:   . Frequency of Communication with Friends and Family:   . Frequency of Social Gatherings with Friends and Family:   .  Attends Religious Services:   . Active Member of Clubs or Organizations:   . Attends Archivist Meetings:   Marland Kitchen Marital Status:      Family History: The patient's family history includes Breast cancer (age of onset: 25) in her sister; Cancer in her sister, sister, and sister; Colon cancer in her sister; Heart attack in her father; Heart disease in her father, mother, and sister; Hyperlipidemia in her daughter; Hypertension in her daughter.  ROS:   Please see the  history of present illness.     All other systems reviewed and are negative.  EKGs/Labs/Other Studies Reviewed:    The following studies were reviewed today: Echo 2019  EKG:  EKG is ordered today.  The ekg ordered today demonstrates AF with VR 44  Recent Labs: 10/06/2019: ALT 12; BUN 12; Creat 0.79; Hemoglobin 11.9; Magnesium 1.8; Platelets 234; Potassium 4.2; Sodium 133; TSH 0.61  Recent Lipid Panel    Component Value Date/Time   CHOL 162 10/06/2019 1057   TRIG 91 10/06/2019 1057   HDL 93 10/06/2019 1057   CHOLHDL 1.7 10/06/2019 1057   VLDL 31 (H) 02/05/2017 1149   LDLCALC 52 10/06/2019 1057    Physical Exam:    VS:  BP (!) 143/67   Pulse (!) 52   Ht 5\' 4"  (1.626 m)   Wt 100 lb (45.4 kg)   SpO2 97%   BMI 17.16 kg/m     Wt Readings from Last 3 Encounters:  10/09/19 100 lb (45.4 kg)  10/06/19 98 lb 9.6 oz (44.7 kg)  09/17/19 101 lb 6.4 oz (46 kg)     GEN: Cachectic female, well developed in no acute distress HEENT: Normal NECK: No JVD; No carotid bruits CARDIAC: irregularly irregular no murmurs, rubs, gallops RESPIRATORY:  Clear to auscultation without rales, wheezing or rhonchi  ABDOMEN: Soft, non-tender, non-distended MUSCULOSKELETAL:  No edema; No deformity  SKIN: Warm and dry NEUROLOGIC:  Alert and oriented x 3 PSYCHIATRIC:  Normal affect   ASSESSMENT:    Atrial fibrillation with controlled ventricular response (HCC) On elliquis 2.5mg  BID due to weighing less than 60 kg.  Her rate is  too slow- I suggested she stop the Lanoxin.  She takes Atenolol 50 mg BID and I suggested she cut this back to 50 mg daily.  COPD (chronic obstructive pulmonary disease) with emphysema ([place]) Via CXR 2013  History of embolic stroke [place] brain stroke on MRI and history of splenic and renal infarcts in the past  PLAN:    As above- I'll see her in F/U in 6 weeks   Medication Adjustments/Labs and Tests Ordered: Current medicines are reviewed at length with the patient today.  Concerns regarding medicines are outlined above.  Orders Placed This Encounter  Procedures  . EKG 12-Lead   Meds ordered this encounter  Medications  . atenolol (TENORMIN) 100 MG tablet    Sig: Take 0.5 tablets (50 mg total) by mouth daily.    Dispense:  45 tablet    Refill:  0    Patient knows to take by mouth.    Patient Instructions  Medication Instructions:   STOP Digoxin  Change Atenolol---take 1/2 tab (50 mg) once daily in the morning.   *If you need a refill on your cardiac medications before your next appointment, please call your pharmacy*   Follow-Up: At [facility], you and your health needs are our priority.  As part of our continuing mission to provide you with exceptional heart care, we have created designated Provider Care Teams.  These Care Teams include your primary Cardiologist (physician) and Advanced Practice Providers (APPs -  Physician Assistants and Nurse Practitioners) who all work together to provide you with the care you need, when you need it.  We recommend signing up for the patient portal called "MyChart".  Sign up information is provided on this After Visit Summary.  MyChart is used to connect with patients for Virtual Visits (Telemedicine).  Patients are able to view lab/test results, encounter  notes, upcoming appointments, etc.  Non-urgent messages can be sent to your provider as well.   To learn more about what you can do with MyChart, go to [email or web address].     Your next appointment:   6 week(s)  The format for your next appointment:   In Person  Provider:   Kerin Ransom, PA        Signed, Kerin Ransom, PA-C  10/09/2019 11:23 AM    [place]

## 2019-10-09 NOTE — Assessment & Plan Note (Signed)
On elliquis 2.5mg  BID due to weighing less than 60 kg.  Her rate is too slow- I suggested she stop the Lanoxin.  She takes Atenolol 50 mg BID and I suggested she cut this back to 50 mg daily.

## 2019-10-09 NOTE — Patient Instructions (Signed)
Medication Instructions:   STOP Digoxin  Change Atenolol---take 1/2 tab (50 mg) once daily in the morning.   *If you need a refill on your cardiac medications before your next appointment, please call your pharmacy*   Follow-Up: At [facility], you and your health needs are our priority.  As part of our continuing mission to provide you with exceptional heart care, we have created designated Provider Care Teams.  These Care Teams include your primary Cardiologist (physician) and Advanced Practice Providers (APPs -  Physician Assistants and Nurse Practitioners) who all work together to provide you with the care you need, when you need it.  We recommend signing up for the patient portal called "MyChart".  Sign up information is provided on this After Visit Summary.  MyChart is used to connect with patients for Virtual Visits (Telemedicine).  Patients are able to view lab/test results, encounter notes, upcoming appointments, etc.  Non-urgent messages can be sent to your provider as well.   To learn more about what you can do with MyChart, go to [email or web address].    Your next appointment:   6 week(s)  The format for your next appointment:   In Person  Provider:   Kerin Ransom, [place]

## 2019-10-10 ENCOUNTER — Emergency Department (HOSPITAL_COMMUNITY): Payer: PPO

## 2019-10-10 ENCOUNTER — Encounter (HOSPITAL_COMMUNITY): Payer: Self-pay | Admitting: *Deleted

## 2019-10-10 ENCOUNTER — Other Ambulatory Visit: Payer: Self-pay

## 2019-10-10 ENCOUNTER — Observation Stay (HOSPITAL_COMMUNITY)
Admission: EM | Admit: 2019-10-10 | Discharge: 2019-10-12 | Disposition: A | Discharge status: Home / Self Care | Payer: PPO | Attending: Internal Medicine | Admitting: Internal Medicine

## 2019-10-10 DIAGNOSIS — K219 Gastro-esophageal reflux disease without esophagitis: Secondary | ICD-10-CM | POA: Diagnosis present

## 2019-10-10 DIAGNOSIS — Z791 Long term (current) use of non-steroidal anti-inflammatories (NSAID): Secondary | ICD-10-CM | POA: Insufficient documentation

## 2019-10-10 DIAGNOSIS — M797 Fibromyalgia: Secondary | ICD-10-CM | POA: Diagnosis not present

## 2019-10-10 DIAGNOSIS — F419 Anxiety disorder, unspecified: Secondary | ICD-10-CM | POA: Diagnosis not present

## 2019-10-10 DIAGNOSIS — R531 Weakness: Secondary | ICD-10-CM | POA: Insufficient documentation

## 2019-10-10 DIAGNOSIS — I313 Pericardial effusion (noninflammatory): Secondary | ICD-10-CM | POA: Diagnosis not present

## 2019-10-10 DIAGNOSIS — I130 Hypertensive heart and chronic kidney disease with heart failure and stage 1 through stage 4 chronic kidney disease, or unspecified chronic kidney disease: Secondary | ICD-10-CM | POA: Insufficient documentation

## 2019-10-10 DIAGNOSIS — E871 Hypo-osmolality and hyponatremia: Secondary | ICD-10-CM | POA: Insufficient documentation

## 2019-10-10 DIAGNOSIS — Z881 Allergy status to other antibiotic agents status: Secondary | ICD-10-CM | POA: Insufficient documentation

## 2019-10-10 DIAGNOSIS — I088 Other rheumatic multiple valve diseases: Secondary | ICD-10-CM | POA: Insufficient documentation

## 2019-10-10 DIAGNOSIS — R42 Dizziness and giddiness: Secondary | ICD-10-CM | POA: Diagnosis not present

## 2019-10-10 DIAGNOSIS — Z79899 Other long term (current) drug therapy: Secondary | ICD-10-CM | POA: Insufficient documentation

## 2019-10-10 DIAGNOSIS — E785 Hyperlipidemia, unspecified: Secondary | ICD-10-CM | POA: Insufficient documentation

## 2019-10-10 DIAGNOSIS — G500 Trigeminal neuralgia: Secondary | ICD-10-CM

## 2019-10-10 DIAGNOSIS — R110 Nausea: Secondary | ICD-10-CM | POA: Diagnosis not present

## 2019-10-10 DIAGNOSIS — E782 Mixed hyperlipidemia: Secondary | ICD-10-CM | POA: Diagnosis not present

## 2019-10-10 DIAGNOSIS — E559 Vitamin D deficiency, unspecified: Secondary | ICD-10-CM | POA: Diagnosis not present

## 2019-10-10 DIAGNOSIS — Z90710 Acquired absence of both cervix and uterus: Secondary | ICD-10-CM | POA: Insufficient documentation

## 2019-10-10 DIAGNOSIS — M810 Age-related osteoporosis without current pathological fracture: Secondary | ICD-10-CM | POA: Insufficient documentation

## 2019-10-10 DIAGNOSIS — M1990 Unspecified osteoarthritis, unspecified site: Secondary | ICD-10-CM | POA: Diagnosis not present

## 2019-10-10 DIAGNOSIS — Z803 Family history of malignant neoplasm of breast: Secondary | ICD-10-CM | POA: Insufficient documentation

## 2019-10-10 DIAGNOSIS — Z886 Allergy status to analgesic agent status: Secondary | ICD-10-CM | POA: Insufficient documentation

## 2019-10-10 DIAGNOSIS — Z87442 Personal history of urinary calculi: Secondary | ICD-10-CM | POA: Insufficient documentation

## 2019-10-10 DIAGNOSIS — R001 Bradycardia, unspecified: Secondary | ICD-10-CM | POA: Diagnosis not present

## 2019-10-10 DIAGNOSIS — N189 Chronic kidney disease, unspecified: Secondary | ICD-10-CM | POA: Diagnosis not present

## 2019-10-10 DIAGNOSIS — Z8249 Family history of ischemic heart disease and other diseases of the circulatory system: Secondary | ICD-10-CM | POA: Insufficient documentation

## 2019-10-10 DIAGNOSIS — Z91040 Latex allergy status: Secondary | ICD-10-CM | POA: Insufficient documentation

## 2019-10-10 DIAGNOSIS — I4891 Unspecified atrial fibrillation: Secondary | ICD-10-CM | POA: Diagnosis present

## 2019-10-10 DIAGNOSIS — R112 Nausea with vomiting, unspecified: Secondary | ICD-10-CM

## 2019-10-10 DIAGNOSIS — Z888 Allergy status to other drugs, medicaments and biological substances status: Secondary | ICD-10-CM | POA: Insufficient documentation

## 2019-10-10 DIAGNOSIS — Z8349 Family history of other endocrine, nutritional and metabolic diseases: Secondary | ICD-10-CM | POA: Insufficient documentation

## 2019-10-10 DIAGNOSIS — I2510 Atherosclerotic heart disease of native coronary artery without angina pectoris: Secondary | ICD-10-CM | POA: Insufficient documentation

## 2019-10-10 DIAGNOSIS — Z800 Family history of malignant neoplasm of digestive organs: Secondary | ICD-10-CM | POA: Insufficient documentation

## 2019-10-10 DIAGNOSIS — I5042 Chronic combined systolic (congestive) and diastolic (congestive) heart failure: Secondary | ICD-10-CM | POA: Insufficient documentation

## 2019-10-10 DIAGNOSIS — Z8673 Personal history of transient ischemic attack (TIA), and cerebral infarction without residual deficits: Secondary | ICD-10-CM | POA: Insufficient documentation

## 2019-10-10 DIAGNOSIS — I10 Essential (primary) hypertension: Secondary | ICD-10-CM | POA: Diagnosis not present

## 2019-10-10 DIAGNOSIS — R0789 Other chest pain: Secondary | ICD-10-CM | POA: Diagnosis not present

## 2019-10-10 DIAGNOSIS — J449 Chronic obstructive pulmonary disease, unspecified: Secondary | ICD-10-CM | POA: Diagnosis not present

## 2019-10-10 DIAGNOSIS — R000 Tachycardia, unspecified: Secondary | ICD-10-CM | POA: Diagnosis not present

## 2019-10-10 DIAGNOSIS — I700 Atherosclerosis of aorta: Secondary | ICD-10-CM | POA: Diagnosis not present

## 2019-10-10 DIAGNOSIS — Z7901 Long term (current) use of anticoagulants: Secondary | ICD-10-CM | POA: Insufficient documentation

## 2019-10-10 DIAGNOSIS — R55 Syncope and collapse: Secondary | ICD-10-CM | POA: Diagnosis not present

## 2019-10-10 DIAGNOSIS — Z885 Allergy status to narcotic agent status: Secondary | ICD-10-CM | POA: Insufficient documentation

## 2019-10-10 DIAGNOSIS — Z880 Allergy status to penicillin: Secondary | ICD-10-CM | POA: Insufficient documentation

## 2019-10-10 DIAGNOSIS — Z20822 Contact with and (suspected) exposure to covid-19: Secondary | ICD-10-CM | POA: Diagnosis not present

## 2019-10-10 LAB — CBC WITH DIFFERENTIAL/PLATELET
Abs Immature Granulocytes: 0.01 10*3/uL (ref 0.00–0.07)
Basophils Absolute: 0 10*3/uL (ref 0.0–0.1)
Basophils Relative: 1 %
Eosinophils Absolute: 0.1 10*3/uL (ref 0.0–0.5)
Eosinophils Relative: 1 %
HCT: 33.8 % — ABNORMAL LOW (ref 36.0–46.0)
Hemoglobin: 10.4 g/dL — ABNORMAL LOW (ref 12.0–15.0)
Immature Granulocytes: 0 %
Lymphocytes Relative: 23 %
Lymphs Abs: 1.2 10*3/uL (ref 0.7–4.0)
MCH: 28.7 pg (ref 26.0–34.0)
MCHC: 30.8 g/dL (ref 30.0–36.0)
MCV: 93.4 fL (ref 80.0–100.0)
Monocytes Absolute: 0.6 10*3/uL (ref 0.1–1.0)
Monocytes Relative: 11 %
Neutro Abs: 3.5 10*3/uL (ref 1.7–7.7)
Neutrophils Relative %: 64 %
Platelets: 189 10*3/uL (ref 150–400)
RBC: 3.62 MIL/uL — ABNORMAL LOW (ref 3.87–5.11)
RDW: 13.6 % (ref 11.5–15.5)
WBC: 5.4 10*3/uL (ref 4.0–10.5)
nRBC: 0 % (ref 0.0–0.2)

## 2019-10-10 LAB — URINALYSIS, ROUTINE W REFLEX MICROSCOPIC
Bilirubin Urine: NEGATIVE
Glucose, UA: NEGATIVE mg/dL
Hgb urine dipstick: NEGATIVE
Ketones, ur: NEGATIVE mg/dL
Leukocytes,Ua: NEGATIVE
Nitrite: NEGATIVE
Protein, ur: NEGATIVE mg/dL
Specific Gravity, Urine: 1.004 — ABNORMAL LOW (ref 1.005–1.030)
pH: 9 — ABNORMAL HIGH (ref 5.0–8.0)

## 2019-10-10 LAB — TROPONIN I (HIGH SENSITIVITY)
Troponin I (High Sensitivity): 9 ng/L (ref <18)
Troponin I (High Sensitivity): 13 ng/L (ref <18)

## 2019-10-10 LAB — COMPREHENSIVE METABOLIC PANEL
ALT: 18 U/L (ref 0–44)
AST: 23 U/L (ref 15–41)
Albumin: 3.9 g/dL (ref 3.5–5.0)
Alkaline Phosphatase: 84 U/L (ref 38–126)
Anion gap: 11 (ref 5–15)
BUN: 9 mg/dL (ref 8–23)
CO2: 28 mmol/L (ref 22–32)
Calcium: 8.7 mg/dL — ABNORMAL LOW (ref 8.9–10.3)
Chloride: 95 mmol/L — ABNORMAL LOW (ref 98–111)
Creatinine, Ser: 0.9 mg/dL (ref 0.44–1.00)
GFR calc Af Amer: >60 mL/min (ref >60)
GFR calc non Af Amer: 59 mL/min — ABNORMAL LOW (ref >60)
Glucose, Bld: 101 mg/dL — ABNORMAL HIGH (ref 70–99)
Potassium: 3.7 mmol/L (ref 3.5–5.1)
Sodium: 134 mmol/L — ABNORMAL LOW (ref 135–145)
Total Bilirubin: 0.7 mg/dL (ref 0.3–1.2)
Total Protein: 6.6 g/dL (ref 6.5–8.1)

## 2019-10-10 LAB — LACTIC ACID, PLASMA
Lactic Acid, Venous: 1.2 mmol/L (ref 0.5–1.9)
Lactic Acid, Venous: 1.5 mmol/L (ref 0.5–1.9)

## 2019-10-10 LAB — PHOSPHORUS: Phosphorus: 3.9 mg/dL (ref 2.5–4.6)

## 2019-10-10 LAB — LIPASE, BLOOD: Lipase: 45 U/L (ref 11–51)

## 2019-10-10 LAB — DIGOXIN LEVEL: Digoxin Level: 0.8 ng/mL (ref 0.8–2.0)

## 2019-10-10 LAB — SARS CORONAVIRUS 2 (TAT 6-24 HRS): SARS Coronavirus 2: NEGATIVE

## 2019-10-10 LAB — MAGNESIUM: Magnesium: 1.9 mg/dL (ref 1.7–2.4)

## 2019-10-10 MED ORDER — CLONAZEPAM 0.25 MG PO TBDP
0.2500 mg | ORAL_TABLET | Freq: Two times a day (BID) | ORAL | Status: DC | PRN
  Administered 2019-10-11: 0.25 mg via ORAL
  Filled 2019-10-10 (×3): qty 1

## 2019-10-10 MED ORDER — GABAPENTIN 600 MG PO TABS
300.0000 mg | ORAL_TABLET | Freq: Three times a day (TID) | ORAL | Status: DC
  Administered 2019-10-10 – 2019-10-12 (×6): 300 mg via ORAL
  Filled 2019-10-10 (×6): qty 1

## 2019-10-10 MED ORDER — APIXABAN 2.5 MG PO TABS
2.5000 mg | ORAL_TABLET | Freq: Two times a day (BID) | ORAL | Status: DC
  Administered 2019-10-10 – 2019-10-12 (×4): 2.5 mg via ORAL
  Filled 2019-10-10 (×4): qty 1

## 2019-10-10 MED ORDER — CLONAZEPAM 0.5 MG PO TABS
0.5000 mg | ORAL_TABLET | Freq: Once | ORAL | Status: AC
  Administered 2019-10-10: 0.5 mg via ORAL
  Filled 2019-10-10: qty 1

## 2019-10-10 MED ORDER — SODIUM CHLORIDE 0.9% FLUSH
3.0000 mL | Freq: Two times a day (BID) | INTRAVENOUS | Status: DC
  Administered 2019-10-11: 3 mL via INTRAVENOUS

## 2019-10-10 MED ORDER — APIXABAN 2.5 MG PO TABS
2.5000 mg | ORAL_TABLET | Freq: Two times a day (BID) | ORAL | Status: DC
  Filled 2019-10-10: qty 1

## 2019-10-10 MED ORDER — ONDANSETRON HCL 4 MG/2ML IJ SOLN
4.0000 mg | Freq: Once | INTRAMUSCULAR | Status: AC
  Administered 2019-10-10: 4 mg via INTRAVENOUS
  Filled 2019-10-10: qty 2

## 2019-10-10 MED ORDER — PRAVASTATIN SODIUM 10 MG PO TABS
20.0000 mg | ORAL_TABLET | Freq: Every day | ORAL | Status: DC
  Administered 2019-10-11: 20 mg via ORAL
  Filled 2019-10-10: qty 2

## 2019-10-10 MED ORDER — ATENOLOL 50 MG PO TABS
50.0000 mg | ORAL_TABLET | Freq: Every day | ORAL | Status: DC
  Administered 2019-10-12: 50 mg via ORAL
  Filled 2019-10-10: qty 1

## 2019-10-10 MED ORDER — PANTOPRAZOLE SODIUM 40 MG PO TBEC
40.0000 mg | DELAYED_RELEASE_TABLET | Freq: Every day | ORAL | Status: DC
  Administered 2019-10-11 – 2019-10-12 (×2): 40 mg via ORAL
  Filled 2019-10-10 (×2): qty 1

## 2019-10-10 MED ORDER — SODIUM CHLORIDE 0.9% FLUSH: 3.0000 mL | INTRAVENOUS | Status: DC | PRN

## 2019-10-10 MED ORDER — PHENAZOPYRIDINE HCL 100 MG PO TABS: 100.0000 mg | ORAL_TABLET | Freq: Three times a day (TID) | ORAL | Status: DC | PRN

## 2019-10-10 MED ORDER — ONDANSETRON HCL 4 MG/2ML IJ SOLN
4.0000 mg | Freq: Four times a day (QID) | INTRAMUSCULAR | Status: DC | PRN
  Administered 2019-10-10 – 2019-10-11 (×2): 4 mg via INTRAVENOUS
  Filled 2019-10-10 (×2): qty 2

## 2019-10-10 MED ORDER — ACETAMINOPHEN 500 MG PO TABS: 1000.0000 mg | ORAL_TABLET | Freq: Every day | ORAL | Status: DC

## 2019-10-10 MED ORDER — ACETAMINOPHEN 500 MG PO TABS
1000.0000 mg | ORAL_TABLET | Freq: Every day | ORAL | Status: DC
  Administered 2019-10-10 – 2019-10-11 (×2): 1000 mg via ORAL
  Filled 2019-10-10 (×2): qty 2

## 2019-10-10 MED ORDER — SODIUM CHLORIDE 0.9 % IV SOLN: 250.0000 mL | INTRAVENOUS | Status: DC | PRN

## 2019-10-10 MED ORDER — MIRTAZAPINE 15 MG PO TABS
7.5000 mg | ORAL_TABLET | Freq: Every day | ORAL | Status: DC
  Administered 2019-10-10 – 2019-10-11 (×2): 7.5 mg via ORAL
  Filled 2019-10-10 (×3): qty 1

## 2019-10-10 MED ORDER — IRBESARTAN 300 MG PO TABS
300.0000 mg | ORAL_TABLET | Freq: Every day | ORAL | Status: DC
  Administered 2019-10-10 – 2019-10-12 (×3): 300 mg via ORAL
  Filled 2019-10-10 (×3): qty 1

## 2019-10-10 MED ORDER — ALUM & MAG HYDROXIDE-SIMETH 200-200-20 MG/5ML PO SUSP
30.0000 mL | Freq: Four times a day (QID) | ORAL | Status: DC | PRN
  Filled 2019-10-10: qty 30

## 2019-10-10 MED ORDER — POLYETHYLENE GLYCOL 3350 17 G PO PACK
17.0000 g | PACK | Freq: Every day | ORAL | Status: DC | PRN
  Administered 2019-10-12: 17 g via ORAL
  Filled 2019-10-10 (×2): qty 1

## 2019-10-10 MED ORDER — VITAMIN D 25 MCG (1000 UNIT) PO TABS
5000.0000 [IU] | ORAL_TABLET | Freq: Every day | ORAL | Status: DC
  Administered 2019-10-11 – 2019-10-12 (×2): 5000 [IU] via ORAL
  Filled 2019-10-10 (×3): qty 5

## 2019-10-10 MED ORDER — SODIUM CHLORIDE 0.9% FLUSH
3.0000 mL | Freq: Two times a day (BID) | INTRAVENOUS | Status: DC
  Administered 2019-10-10 – 2019-10-12 (×4): 3 mL via INTRAVENOUS

## 2019-10-10 MED ORDER — ONDANSETRON HCL 4 MG PO TABS: 4.0000 mg | ORAL_TABLET | Freq: Four times a day (QID) | ORAL | Status: DC | PRN

## 2019-10-10 NOTE — ED Provider Notes (Signed)
[facility] EMERGENCY DEPARTMENT Provider Note   CSN: [id number] Arrival date & time: 10/10/19  1414     History Chief Complaint  Patient presents with  . Loss of Consciousness    Debra White is a 86 y.o. female.  The history is provided by the patient, the EMS personnel and medical records. No language interpreter was used.  Loss of Consciousness  Debra White is a 87 y.o. female who presents to the Emergency Department complaining of syncope. She presents the emergency department by EMS after calling out for feeling sick. She was seen by her cardiologist yesterday and was told that she was bradycardic and her digoxin was discontinued. Today she went to lunch with a friend and when she returned home she began to feel poorly with generalized malaise, abdominal bloating, chest discomfort, mild shortness of breath. She called 911 for the symptoms. EMS was ambulating her to the stretcher when she had a witnessed syncopal event. Paramedics caught the patient and she did not fall or injure herself. She reports feeling improved on ED arrival but has ongoing chest and abdominal pressure/bloating and discomfort with mild nausea. She denies any fevers, diarrhea, hematochezia, melena. She states that she has a history of recurrent abdominal discomfort.  Lives at home with husbandLives at home with husband.     Past Medical History:  Diagnosis Date  . Acute on chronic combined systolic and diastolic CHF (congestive heart failure) ([place]) 06/29/2019  . Anxiety   . Arthritis   . Blood transfusion   . C. difficile colitis   . Candida esophagitis ([place]) 2013   EGD  . Cataract   . Chronic kidney disease    STONES  . Diverticulitis 04/05/2017  . Diverticulosis of colon (without mention of hemorrhage) 2007   Colonoscopy  . Dysrhythmia    RX  . Emphysema of lung ([place])   . Family history of colon cancer    sister  . Fibromyalgia   . Fracture, zygoma closed ([place])  07/13/2011  . GERD (gastroesophageal reflux disease)   . Headache(784.0)   . Hyperlipemia   . Hypertension   . Kidney stones   . Ptosis, bilateral   . Renal infarct ([place])   . Splenic infarct     Patient Active Problem List   Diagnosis Date Noted  . Diastolic CHF ([place]) XX123456  . History of embolic stroke XX123456  . Atrial fibrillation with controlled ventricular response ([place]) 03/12/2019  . Coronary atherosclerosis due to lipid rich plaque 03/26/2018  . Renal infarct ([place]) 03/22/2018  . Chronic hyponatremia 03/22/2018  . Chronic hyperkalemia 03/22/2018  . Hepatic steatosis 11/17/2017  . Aortic atherosclerosis ([place]) 08/08/2017  . COPD (chronic obstructive pulmonary disease) with emphysema ([place]) 12/21/2015  . 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 Osteoporosis 12/21/2015  . Mild malnutrition ([place]) 05/11/2015  . Encounter for Medicare annual wellness exam 04/08/2015  . Medication management 01/27/2014  . 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 Vitamin D deficiency 07/08/2013  . Hypertension   . Hyperlipidemia   . GERD   . Headache, post-traumatic, chronic 09/20/2011    Past Surgical History:  Procedure Laterality Date  . ABDOMINAL HYSTERECTOMY    . BACK SURGERY     X2  . BACK SURGERY    . BREAST EXCISIONAL BIOPSY    . CERVICAL DISC SURGERY    . COLONOSCOPY    . FACIAL FRACTURE SURGERY    . HAND SURGERY     BIL   . ORIF  TRIPOD FRACTURE  07/13/2011   Procedure: OPEN REDUCTION INTERNAL FIXATION (ORIF) TRIPOD FRACTURE;  Surgeon: Tyson Alias, MD;  Location: [facility];  Service: ENT;  Laterality: Right;  ORIF RIGHT ZYGOMA, ORBITAL FLOOR EXPLORATION WITH FROST STITCH (TEMPORARY TARSORRHAPHY)  . PTOSIS REPAIR Bilateral 11/22/2016   Procedure: INTERNAL PTOSIS REPAIR;  Surgeon: Clista Bernhardt, MD;  Location: [facility];  Service: Ophthalmology;  Laterality: Bilateral;  . SHOULDER ARTHROSCOPY W/ ROTATOR CUFF REPAIR     LFT     OB History   No obstetric history on file.      Family History  Problem Relation Age of Onset  . Heart disease Mother   . Heart disease Father   . Heart attack Father   . Breast cancer Sister 18  . Colon cancer Sister   . Cancer Sister        breast  . Heart disease Sister   . Cancer Sister        colon  . Cancer Sister        colon  . Hypertension Daughter   . Hyperlipidemia Daughter     Social History   Tobacco Use  . Smoking status: Never Smoker  . Smokeless tobacco: Never Used  Substance Use Topics  . Alcohol use: No  . Drug use: No    Home Medications Prior to Admission medications   Medication Sig Start Date End Date Taking? Authorizing Provider  acetaminophen (TYLENOL) 500 MG tablet Take 1,000 mg by mouth at bedtime.     [provider]  apixaban (ELIQUIS) 5 MG TABS tablet Take 1 tablet 2 x /day to Prevent Blood Clots Patient taking differently: Take 2.5 mg by mouth 2 (two) times daily.  04/14/19   Unk Pinto, MD  atenolol (TENORMIN) 100 MG tablet Take 0.5 tablets (50 mg total) by mouth daily. 10/09/19   Erlene Quan, PA-C  clonazePAM (KLONOPIN) 1 MG tablet Take 1/2 to 1 tablet 2 to 3 x / day  ONLY if needed for Anxiety Attack & please try to limit to 5 days /week to avoid Addiction & Dementia 09/01/19   Unk Pinto, MD  diclofenac Sodium (VOLTAREN) 1 % GEL Apply 2 grams 3 to 4 x / day as needed for Pain 06/30/19   Unk Pinto, MD  dicyclomine (BENTYL) 20 MG tablet Take 1 tablet 4 x /day before Meals & Bedtime if needed for Nausea, Cramping, Bloating or Diarrhea 06/15/19   Unk Pinto, MD  furosemide (LASIX) 40 MG tablet Take 1/2 to 1 tablet Daily if needed for Fluid Retention / Ankle swelling 08/15/19   Unk Pinto, MD  gabapentin (NEURONTIN) 600 MG tablet Take 1/2 to 1 tablet s 3 to 4 x / day as needed for Facial Pain Patient taking differently: Take 300 mg by mouth 3 (three) times daily.  11/14/17 06/08/19  Unk Pinto, MD  lovastatin (MEVACOR) 20 MG tablet Take 1 tablet  at Bedtime for Cholesterol Patient taking differently: Take 20 mg by mouth at bedtime.  03/17/19   Unk Pinto, MD  mirtazapine (REMERON) 15 MG tablet 1/2-1 tablet at night for sleep, anxiety, headache, and to help increase weight. 10/06/19   Vicie Mutters, PA-C  olmesartan (BENICAR) 40 MG tablet Take 1 tablet Daily for BP 09/15/19   Unk Pinto, MD  omeprazole (PRILOSEC) 40 MG capsule Take 1 capsule Daily for Indigestion & Heartburn 08/15/19   Unk Pinto, MD  phenazopyridine (PYRIDIUM) 100 MG tablet Take 1 tablet (100 mg total) by mouth  3 (three) times daily as needed for pain. 07/14/19 07/13/20  Garnet Sierras, NP  VITAMIN D PO Take 5,000 Units by mouth daily.    [provider]    Allergies    Latex, Amoxicillin, Aspirin, Barbiturates, Celebrex [celecoxib], Codeine, Evista [raloxifene], Lipitor [atorvastatin], Morphine and related, Oruvail [ketoprofen], Pantoprazole, Paraffin, Prochlorperazine, Proloprim [trimethoprim], Venlafaxine, Vibra-tab [doxycycline], Vioxx [rofecoxib], Betadine [povidone iodine], and Caffeine  Review of Systems   Review of Systems  Cardiovascular: Positive for syncope.  All other systems reviewed and are negative.   Physical Exam Updated Vital Signs BP (!) 148/106   Pulse 94   Resp (!) 21   SpO2 99%   Physical Exam Vitals and nursing note reviewed.  Constitutional:      Appearance: She is well-developed.  HENT:     Head: Normocephalic and atraumatic.  Cardiovascular:     Rate and Rhythm: Normal rate and regular rhythm.     Heart sounds: No murmur.  Pulmonary:     Effort: Pulmonary effort is normal. No respiratory distress.     Breath sounds: Normal breath sounds.  Abdominal:     Palpations: Abdomen is soft.     Tenderness: There is no guarding or rebound.     Comments: Mild generalized abdominal tenderness  Musculoskeletal:        General: No tenderness.     Comments: Trace edema to BLE  Skin:    General: Skin is warm and  dry.  Neurological:     Mental Status: She is alert and oriented to person, place, and time.  Psychiatric:        Behavior: Behavior normal.     ED Results / Procedures / Treatments   Labs (all labs ordered are listed, but only abnormal results are displayed) Labs Reviewed  COMPREHENSIVE METABOLIC PANEL - Abnormal; Notable for the following components:      Result Value   Sodium 134 (*)    Chloride 95 (*)    Glucose, Bld 101 (*)    Calcium 8.7 (*)    GFR calc non Af Amer 59 (*)    All other components within normal limits  CBC WITH DIFFERENTIAL/PLATELET - Abnormal; Notable for the following components:   RBC 3.62 (*)    Hemoglobin 10.4 (*)    HCT 33.8 (*)    All other components within normal limits  URINALYSIS, ROUTINE W REFLEX MICROSCOPIC - Abnormal; Notable for the following components:   Color, Urine STRAW (*)    Specific Gravity, Urine 1.004 (*)    pH 9.0 (*)    All other components within normal limits  SARS CORONAVIRUS 2 (TAT 6-24 HRS)  LIPASE, BLOOD  LACTIC ACID, PLASMA  LACTIC ACID, PLASMA  DIGOXIN LEVEL  TROPONIN I (HIGH SENSITIVITY)  TROPONIN I (HIGH SENSITIVITY)    EKG EKG Interpretation  Date/Time:  Saturday October 10 2019 14:27:43 EDT Ventricular Rate:  70 PR Interval:    QRS Duration: 91 QT Interval:  407 QTC Calculation: 440 R Axis:   51 Text Interpretation: Atrial fibrillation Borderline ST depression, diffuse leads Confirmed by Quintella Reichert [phone]) on 10/10/2019 2:39:47 PM   Radiology DG Chest 2 View  Result Date: 10/10/2019 CLINICAL DATA:  Syncope. EXAM: CHEST - 2 VIEW COMPARISON:  June 05, 2019. FINDINGS: Stable cardiomegaly. No pneumothorax or pleural effusion is noted. Both lungs are clear. The visualized skeletal structures are unremarkable. IMPRESSION: No active cardiopulmonary disease. Electronically Signed   By: Marijo Conception M.D.   On: 10/10/2019 15:29  Procedures Procedures (including critical care time)  Medications  Ordered in ED Medications  apixaban (ELIQUIS) tablet 2.5 mg (has no administration in time range)  clonazePAM (KLONOPIN) tablet 0.5 mg (0.5 mg Oral Given 10/10/19 1916)  ondansetron (ZOFRAN) injection 4 mg (4 mg Intravenous Given 10/10/19 1916)    ED Course  I have reviewed the triage vital signs and the nursing notes.  Pertinent labs & imaging results that were available during my care of the patient were reviewed by me and considered in my medical decision making (see chart for details).    MDM Rules/Calculators/A&P                     Pt with hx/o atrial fibrillation on eliquis here for evaluation of malaise followed by syncopal event.  She is nontoxic appearing on exam.  EKG with rate controlled atrial fibrillation.  She has minimal abdominal tenderness on exam without peritoneal findings.  On repeat assessment pt complains of ongoing malaise now with associated anxiety, requesting home klonopin.  D/w pt findings of studies and recommendation for admission and she is in agreement with treatment plan.  Hospitalist consulted for admission for syncope.   Final Diagnosis Final diagnoses:  Syncope and collapse    Rx / DC Orders ED Discharge Orders    None       Quintella Reichert, MD 10/10/19 [phone]

## 2019-10-10 NOTE — H&P (Addendum)
History and Physical    Debra White [id number] DOB: December 11, 1933 DOA: 10/10/2019  Referring MD/NP/PA: Dr. Ralene Bathe PCP: Unk Pinto, MD Outpatient Specialists: [place] Patient coming from: Home  Chief Complaint: Weakness and syncope  HPI: Debra White is a 86 y.o. female with medical history significant of atrial fibrillation, prior CVA, hypertension, COPD, anxiety, on chronic anticoagulation who presents to the hospital for generalized weakness. She was recently seen by her cardiology group due to weakness with noted heart rate of 44 so she was told to stop her digoxin and decrease her atenolol which she reports that she has done. This afternoon she remained weak so she called EMS. EMS reported syncopal event as she was walking to the EMS truck. Regarding her weakness patient reports this has been going on for a couple of days however worsened this afternoon after returning home from lunch. She is unable to identify any new changes, exacerbating, or remitting factors. She does admit to associated epigastric discomfort, but denies any chest pain or radiation of the discomfort. She denies any dyspnea, LE edema, dysuria, fever, chills, cough. She does admit to nausea with an episode of vomiting this evening.   ED Course: In the ED patient initially reported feeling well and wanting to go home however upon re evaluation by ED physician patient was tearful and concerned. She was noted to be hypertensive although her HR remained well controlled in the 60-90s range. Patient was hesitant to be admitted as she is the caregiver for her husband. She underwent CXR which was negative for any acute disease. She also had an EKG performed revealing HR of 70 with normal QTc with atrial fibrillation.   Patient admitted to [facility] in observation status for syncope and generalized weakness.   Review of Systems: As per HPI otherwise 10 point review of systems negative.    Past Medical History:  Diagnosis Date  . Acute on chronic combined systolic and diastolic CHF (congestive heart failure) ([place]) 06/29/2019  . Anxiety   . Arthritis   . Blood transfusion   . C. difficile colitis   . Candida esophagitis ([place]) 2013   EGD  . Cataract   . Chronic kidney disease    STONES  . Diverticulitis 04/05/2017  . Diverticulosis of colon (without mention of hemorrhage) 2007   Colonoscopy  . Dysrhythmia    RX  . Emphysema of lung ([place])   . Family history of colon cancer    sister  . Fibromyalgia   . Fracture, zygoma closed ([place]) 07/13/2011  . GERD (gastroesophageal reflux disease)   . Headache(784.0)   . Hyperlipemia   . Hypertension   . Kidney stones   . Ptosis, bilateral   . Renal infarct ([place])   . Splenic infarct     Past Surgical History:  Procedure Laterality Date  . ABDOMINAL HYSTERECTOMY    . BACK SURGERY     X2  . BACK SURGERY    . BREAST EXCISIONAL BIOPSY    . CERVICAL DISC SURGERY    . COLONOSCOPY    . FACIAL FRACTURE SURGERY    . HAND SURGERY     BIL   . ORIF TRIPOD FRACTURE  07/13/2011   Procedure: OPEN REDUCTION INTERNAL FIXATION (ORIF) TRIPOD FRACTURE;  Surgeon: Tyson Alias, MD;  Location: [facility];  Service: ENT;  Laterality: Right;  ORIF RIGHT ZYGOMA, ORBITAL FLOOR EXPLORATION WITH FROST STITCH (TEMPORARY TARSORRHAPHY)  . PTOSIS REPAIR Bilateral 11/22/2016   Procedure: INTERNAL  PTOSIS REPAIR;  Surgeon: Clista Bernhardt, MD;  Location: [facility];  Service: Ophthalmology;  Laterality: Bilateral;  . SHOULDER ARTHROSCOPY W/ ROTATOR CUFF REPAIR     LFT     reports that she has never smoked. She has never used smokeless tobacco. She reports that she does not drink alcohol or use drugs.  Allergies  Allergen Reactions  . Latex Hives    TONGUE HAD BLISTERS WHEN HAD DENTAL SURGERY  . Amoxicillin Nausea And Vomiting  . Aspirin Other (See Comments)    Gi upset  . Barbiturates Other (See Comments)    Unknown reaction  . Celebrex  [Celecoxib] Other (See Comments)    Unknown reaction  . Codeine Itching  . Evista [Raloxifene] Other (See Comments)    Unknown reaction  . Lipitor [Atorvastatin] Other (See Comments)    Unknown reaction  . Morphine And Related Other (See Comments)    Unknown reaction  . Oruvail [Ketoprofen] Other (See Comments)    Unknown reaction  . Pantoprazole Diarrhea  . Paraffin Other (See Comments)    Unknown reaction  . Prochlorperazine Other (See Comments)    Uncontrolled shaking  . Proloprim [Trimethoprim] Other (See Comments)    Unknown reaction  . Venlafaxine Nausea Only  . Vibra-Tab [Doxycycline] Other (See Comments)    Unknown reaction  . Vioxx [Rofecoxib] Other (See Comments)    edema  . Betadine [Povidone Iodine] Rash  . Caffeine Palpitations    Family History  Problem Relation Age of Onset  . Heart disease Mother   . Heart disease Father   . Heart attack Father   . Breast cancer Sister 80  . Colon cancer Sister   . Cancer Sister        breast  . Heart disease Sister   . Cancer Sister        colon  . Cancer Sister        colon  . Hypertension Daughter   . Hyperlipidemia Daughter     Prior to Admission medications   Medication Sig Start Date End Date Taking? Authorizing Provider  acetaminophen (TYLENOL) 500 MG tablet Take 1,000 mg by mouth at bedtime.     [provider]  apixaban (ELIQUIS) 5 MG TABS tablet Take 1 tablet 2 x /day to Prevent Blood Clots Patient taking differently: Take 2.5 mg by mouth 2 (two) times daily.  04/14/19   Unk Pinto, MD  atenolol (TENORMIN) 100 MG tablet Take 0.5 tablets (50 mg total) by mouth daily. 10/09/19   Erlene Quan, PA-C  clonazePAM (KLONOPIN) 1 MG tablet Take 1/2 to 1 tablet 2 to 3 x / day  ONLY if needed for Anxiety Attack & please try to limit to 5 days /week to avoid Addiction & Dementia 09/01/19   Unk Pinto, MD  diclofenac Sodium (VOLTAREN) 1 % GEL Apply 2 grams 3 to 4 x / day as needed for Pain 06/30/19    Unk Pinto, MD  dicyclomine (BENTYL) 20 MG tablet Take 1 tablet 4 x /day before Meals & Bedtime if needed for Nausea, Cramping, Bloating or Diarrhea 06/15/19   Unk Pinto, MD  furosemide (LASIX) 40 MG tablet Take 1/2 to 1 tablet Daily if needed for Fluid Retention / Ankle swelling 08/15/19   Unk Pinto, MD  gabapentin (NEURONTIN) 600 MG tablet Take 1/2 to 1 tablet s 3 to 4 x / day as needed for Facial Pain Patient taking differently: Take 300 mg by mouth 3 (three) times daily.  11/14/17  06/08/19  Unk Pinto, MD  lovastatin (MEVACOR) 20 MG tablet Take 1 tablet at Bedtime for Cholesterol Patient taking differently: Take 20 mg by mouth at bedtime.  03/17/19   Unk Pinto, MD  mirtazapine (REMERON) 15 MG tablet 1/2-1 tablet at night for sleep, anxiety, headache, and to help increase weight. 10/06/19   Vicie Mutters, PA-C  olmesartan (BENICAR) 40 MG tablet Take 1 tablet Daily for BP 09/15/19   Unk Pinto, MD  omeprazole (PRILOSEC) 40 MG capsule Take 1 capsule Daily for Indigestion & Heartburn 08/15/19   Unk Pinto, MD  phenazopyridine (PYRIDIUM) 100 MG tablet Take 1 tablet (100 mg total) by mouth 3 (three) times daily as needed for pain. 07/14/19 07/13/20  Garnet Sierras, NP  VITAMIN D PO Take 5,000 Units by mouth daily.    [provider]    Physical Exam: Vitals:   10/10/19 1700 10/10/19 1715 10/10/19 1730 10/10/19 1900  BP: (!) 172/79 (!) 174/89 (!) 168/98 (!) 148/106  Pulse:  71 76 94  Resp: 18 (!) 21 (!) 23 (!) 21  SpO2:  99% 99% 99%      Constitutional: NAD, calm, comfortable Vitals:   10/10/19 1700 10/10/19 1715 10/10/19 1730 10/10/19 1900  BP: (!) 172/79 (!) 174/89 (!) 168/98 (!) 148/106  Pulse:  71 76 94  Resp: 18 (!) 21 (!) 23 (!) 21  SpO2:  99% 99% 99%   Eyes: PERRL, lids and conjunctivae normal ENMT: Mucous membranes are moist. Posterior pharynx clear of any exudate or lesions.  Neck: normal, supple, no masses, no  thyromegaly Respiratory: clear to auscultation bilaterally, no wheezing, no crackles. Normal respiratory effort. No accessory muscle use.  Cardiovascular: Irregular rhythm, normal rate, no murmurs / rubs / gallops. No extremity edema. 2+ pedal pulses.   Abdomen: no tenderness, no masses palpated. No hepatosplenomegaly. Bowel sounds positive.  Musculoskeletal: no clubbing / cyanosis. No joint deformity upper and lower extremities. Good ROM, no contractures. Normal muscle tone.  Skin: no rashes, lesions, ulcers. No induration Neurologic: CN 2-12 grossly intact. Sensation intact. Strength 5/5 in all 4.  Psychiatric: Normal judgment and insight. Alert and oriented x 3. Normal mood. Anxious.  Labs on Admission: I have personally reviewed following labs and imaging studies  CBC: Recent Labs  Lab 10/06/19 1057 10/10/19 1508  WBC 6.4 5.4  NEUTROABS 4,384 3.5  HGB 11.9 10.4*  HCT 36.9 33.8*  MCV 90.9 93.4  PLT 234 [id number]   Basic Metabolic Panel: Recent Labs  Lab 10/06/19 1057 10/10/19 1508  NA 133* 134*  K 4.2 3.7  CL 92* 95*  CO2 31 28  GLUCOSE 96 101*  BUN 12 9  CREATININE 0.79 0.90  CALCIUM 9.7 8.7*  MG 1.8  --    GFR: Estimated Creatinine Clearance: 33.9 mL/min (by C-G formula based on SCr of 0.9 mg/dL). Liver Function Tests: Recent Labs  Lab 10/06/19 1057 10/10/19 1508  AST 20 23  ALT 12 18  ALKPHOS  --  84  BILITOT 0.7 0.7  PROT 7.6 6.6  ALBUMIN  --  3.9   Recent Labs  Lab 10/10/19 1508  LIPASE 45   No results for input(s): AMMONIA in the last 168 hours. Coagulation Profile: No results for input(s): INR, PROTIME in the last 168 hours. Cardiac Enzymes: No results for input(s): CKTOTAL, CKMB, CKMBINDEX, TROPONINI in the last 168 hours. BNP (last 3 results) No results for input(s): PROBNP in the last 8760 hours. HbA1C: No results for input(s): HGBA1C in the last 72  hours. CBG: No results for input(s): GLUCAP in the last 168 hours. Lipid Profile: No results  for input(s): CHOL, HDL, LDLCALC, TRIG, CHOLHDL, LDLDIRECT in the last 72 hours. Thyroid Function Tests: No results for input(s): TSH, T4TOTAL, FREET4, T3FREE, THYROIDAB in the last 72 hours. Anemia Panel: No results for input(s): VITAMINB12, FOLATE, FERRITIN, TIBC, IRON, RETICCTPCT in the last 72 hours. Urine analysis:    Component Value Date/Time   COLORURINE STRAW (A) 10/10/2019 1727   APPEARANCEUR CLEAR 10/10/2019 1727   LABSPEC 1.004 (L) 10/10/2019 1727   PHURINE 9.0 (H) 10/10/2019 1727   GLUCOSEU NEGATIVE 10/10/2019 1727   HGBUR NEGATIVE 10/10/2019 1727   BILIRUBINUR NEGATIVE 10/10/2019 1727   KETONESUR NEGATIVE 10/10/2019 1727   PROTEINUR NEGATIVE 10/10/2019 1727   UROBILINOGEN 0.2 04/13/2015 1700   NITRITE NEGATIVE 10/10/2019 1727   LEUKOCYTESUR NEGATIVE 10/10/2019 1727   Sepsis Labs: @LABRCNTIP (procalcitonin:4,lacticidven:4) )No results found for this or any previous visit (from the past 240 hour(s)).   Radiological Exams on Admission: DG Chest 2 View  Result Date: 10/10/2019 CLINICAL DATA:  Syncope. EXAM: CHEST - 2 VIEW COMPARISON:  June 05, 2019. FINDINGS: Stable cardiomegaly. No pneumothorax or pleural effusion is noted. Both lungs are clear. The visualized skeletal structures are unremarkable. IMPRESSION: No active cardiopulmonary disease. Electronically Signed   By: Marijo Conception M.D.   On: 10/10/2019 15:29    EKG: Independently reviewed. A fib with normal QTc  Assessment/Plan Active Problems:   Hypertension   Hyperlipidemia   GERD   Vitamin D deficiency   Chronic hyponatremia   Atrial fibrillation with controlled ventricular response (HCC)   Syncope  #Syncope - Syncopal likely due to her known atrial fibrillation with recent slow ventricular response on BB and digoxin - No recent TTE - dig level acceptable - Will hold digoxin and continue decreased dose of atenolol at 25 mg daily - monitor on telemetry - Obtain TTE. May need cardiology consult  pending work up above  #Hypertension - Continue ACEi and atenolol - Holding lasix as she has no edema.  - Obtaining orthostatics and if negative likely to restart lasix for tomorrow  #GERD - Continue PPI  #HLD - Continue statin. History of statin intolerance so will monitor  #Vitamin D deficiency - continue Vitamin D replacement  #Atrial fibrillation - Continue atenolol at decreased dose - Monitor on telemetry - Continue AC with eliquis (2.5 mg BID given age and weight)   #Chronic Hyponatremia - Stable. Repeat CMP in AM  #Weakness - Work up as above - Checking magnesium and phosphorus as well - She has had a recent TSH on 10/06/19 so will not repeat  DVT prophylaxis: Eliquis Code Status: DNR Family Communication: None Disposition Plan: Anticipate discharge home when medically stable Consults called: None Admission status: Observation   Arlan Organ DO [facility]  10/10/2019, 8:12 PM

## 2019-10-10 NOTE — ED Triage Notes (Signed)
EMS reported pt has a syncope episode while walking to EMS truck. EMS caught pt a fall did not occur . On Friday Pt was reported to have gone to PCP and her pulse was 40. PCP stopped her Digoxin . EMS reported a HR of 72. Pt did have an elevated BP SBP in 170's. LAST bp 204/104.

## 2019-10-11 ENCOUNTER — Observation Stay (HOSPITAL_BASED_OUTPATIENT_CLINIC_OR_DEPARTMENT_OTHER): Payer: PPO

## 2019-10-11 ENCOUNTER — Observation Stay (HOSPITAL_COMMUNITY): Payer: PPO

## 2019-10-11 DIAGNOSIS — K219 Gastro-esophageal reflux disease without esophagitis: Secondary | ICD-10-CM

## 2019-10-11 DIAGNOSIS — E559 Vitamin D deficiency, unspecified: Secondary | ICD-10-CM | POA: Diagnosis not present

## 2019-10-11 DIAGNOSIS — E782 Mixed hyperlipidemia: Secondary | ICD-10-CM

## 2019-10-11 DIAGNOSIS — R110 Nausea: Secondary | ICD-10-CM

## 2019-10-11 DIAGNOSIS — R55 Syncope and collapse: Principal | ICD-10-CM

## 2019-10-11 DIAGNOSIS — I10 Essential (primary) hypertension: Secondary | ICD-10-CM

## 2019-10-11 DIAGNOSIS — E871 Hypo-osmolality and hyponatremia: Secondary | ICD-10-CM

## 2019-10-11 DIAGNOSIS — R531 Weakness: Secondary | ICD-10-CM | POA: Diagnosis not present

## 2019-10-11 DIAGNOSIS — I4891 Unspecified atrial fibrillation: Secondary | ICD-10-CM

## 2019-10-11 DIAGNOSIS — I341 Nonrheumatic mitral (valve) prolapse: Secondary | ICD-10-CM

## 2019-10-11 DIAGNOSIS — I951 Orthostatic hypotension: Secondary | ICD-10-CM | POA: Diagnosis not present

## 2019-10-11 DIAGNOSIS — I361 Nonrheumatic tricuspid (valve) insufficiency: Secondary | ICD-10-CM | POA: Diagnosis not present

## 2019-10-11 DIAGNOSIS — R112 Nausea with vomiting, unspecified: Secondary | ICD-10-CM | POA: Diagnosis not present

## 2019-10-11 LAB — COMPREHENSIVE METABOLIC PANEL
ALT: 15 U/L (ref 0–44)
AST: 21 U/L (ref 15–41)
Albumin: 4.1 g/dL (ref 3.5–5.0)
Alkaline Phosphatase: 85 U/L (ref 38–126)
Anion gap: 9 (ref 5–15)
BUN: 7 mg/dL — ABNORMAL LOW (ref 8–23)
CO2: 28 mmol/L (ref 22–32)
Calcium: 8.9 mg/dL (ref 8.9–10.3)
Chloride: 96 mmol/L — ABNORMAL LOW (ref 98–111)
Creatinine, Ser: 0.8 mg/dL (ref 0.44–1.00)
GFR calc Af Amer: >60 mL/min (ref >60)
GFR calc non Af Amer: >60 mL/min (ref >60)
Glucose, Bld: 109 mg/dL — ABNORMAL HIGH (ref 70–99)
Potassium: 4 mmol/L (ref 3.5–5.1)
Sodium: 133 mmol/L — ABNORMAL LOW (ref 135–145)
Total Bilirubin: 0.7 mg/dL (ref 0.3–1.2)
Total Protein: 6.5 g/dL (ref 6.5–8.1)

## 2019-10-11 LAB — CBC
HCT: 35.2 % — ABNORMAL LOW (ref 36.0–46.0)
Hemoglobin: 11.2 g/dL — ABNORMAL LOW (ref 12.0–15.0)
MCH: 29 pg (ref 26.0–34.0)
MCHC: 31.8 g/dL (ref 30.0–36.0)
MCV: 91.2 fL (ref 80.0–100.0)
Platelets: 210 10*3/uL (ref 150–400)
RBC: 3.86 MIL/uL — ABNORMAL LOW (ref 3.87–5.11)
RDW: 13.5 % (ref 11.5–15.5)
WBC: 6.7 10*3/uL (ref 4.0–10.5)
nRBC: 0 % (ref 0.0–0.2)

## 2019-10-11 LAB — ECHOCARDIOGRAM COMPLETE
Height: 64 in
Weight: 1502.4 oz

## 2019-10-11 LAB — GLUCOSE, CAPILLARY: Glucose-Capillary: 85 mg/dL (ref 70–99)

## 2019-10-11 MED ORDER — CLONAZEPAM 0.25 MG PO TBDP
0.2500 mg | ORAL_TABLET | Freq: Three times a day (TID) | ORAL | Status: DC | PRN
  Administered 2019-10-11 – 2019-10-12 (×4): 0.25 mg via ORAL
  Filled 2019-10-11 (×3): qty 1

## 2019-10-11 NOTE — Progress Notes (Signed)
  Echocardiogram 2D Echocardiogram has been performed.  Elmer Ramp 10/11/2019, 12:33 PM

## 2019-10-11 NOTE — Progress Notes (Signed)
[facility]  PROGRESS NOTE    Debra White  [id number] DOB: [birth year] DOA: 10/10/2019 PCP: Unk Pinto, MD   Brief Narrative:   Debra White is a 86 y.o. female with medical history significant of atrial fibrillation, prior CVA, hypertension, COPD, anxiety, on chronic anticoagulation who presents to the hospital for generalized weakness. She was recently seen by her cardiology group due to weakness with noted heart rate of 44 so she was told to stop her digoxin and decrease her atenolol which she reports that she has done. This afternoon she remained weak so she called EMS. EMS reported syncopal event as she was walking to the EMS truck. Regarding her weakness patient reports this has been going on for a couple of days however worsened this afternoon after returning home from lunch. She is unable to identify any new changes, exacerbating, or remitting factors. She does admit to associated epigastric discomfort, but denies any chest pain or radiation of the discomfort. She denies any dyspnea, LE edema, dysuria, fever, chills, cough. She does admit to nausea with an episode of vomiting this evening.  4/18: Awaiting Echo read. She is nauseous today. Zofran helping some. Check KUB. UA is negative. Orthostatics are negative. She did come in somewhat bradycardic. Dig was recently stopped and her level yesterday shows low normal. Updated dtr by phone. Let's get PT/OT to see her.    Assessment & Plan:   Active Problems:   Hypertension   Hyperlipidemia   GERD   Vitamin D deficiency   Chronic hyponatremia   Atrial fibrillation with controlled ventricular response (HCC)   Syncope  Syncope     - at admission she was bradycardic     - BP was actually high and orthostatics negative     - per dtr, dig was stopped by MD on Friday and her dig level at admission was 0.8 (low normal)     - echo report pending     - currently tolerating atenolol 25 mg     - she has no focality on exam     - she  does report nausea today, but can't tell me that she has been having nausea prior to admission  Hypertension     - Continue ACEi and atenolol     - continue to hold lasix as she has no edema and she is hyponatremic   GERD     - continue protonix  HLD     - continue pravastatin  Vitamin D deficiency     - continue Vitamin D replacement  Atrial fibrillation     - Continue atenolol at decreased dose     - Monitor on telemetry     - Continue AC with eliquis (2.5 mg BID given age and weight)   Chronic Hyponatremia     - Stable  Weakness     - Work up as above     - Mg2+ and Phos are ok     - She has had a recent TSH on 10/06/19 so will not repeat     - PT/OT to see  Nausea     - UA negative, no temp; check KUB  DVT prophylaxis: eliquis Code Status: DNR Family Communication: Spoke with dtr by phone   Status is: Observation  The patient remains OBS appropriate and will d/c before 2 midnights.  Dispo: The patient is from: Home              Anticipated d/c is to: Home  Anticipated d/c date is: 1 day              Patient currently is not medically stable to d/c.  ROS:  Reports nausea. Remainder 10-pt ROS is negative for all not previously mentioned.  Subjective: "I feel shaky"  Objective: Vitals:   10/10/19 2232 10/11/19 0534 10/11/19 0700 10/11/19 1229  BP: (!) 158/98 (!) 148/72  138/73  Pulse: 66 64  76  Resp: 16 16  16   Temp:  98.4 F (36.9 C)  97.6 F (36.4 C)  TempSrc:  Oral  Oral  SpO2: 99% 96%  98%  Weight: 43.4 kg  42.6 kg   Height: 5\' 4"  (1.626 m)       Intake/Output Summary (Last 24 hours) at 10/11/2019 1329 Last data filed at 10/11/2019 0100 Gross per 24 hour  Intake 120 ml  Output --  Net 120 ml   Filed Weights   10/10/19 2232 10/11/19 0700  Weight: 43.4 kg 42.6 kg    Examination:  General: 86 y.o. female resting in bed in NAD Cardiovascular: RRR, +S1, S2, no m/g/r Respiratory: CTABL, no w/r/r, normal WOB GI: BS+,  NDNT, soft MSK: No e/c/c Neuro: alert to name, follows commands Psyc: Appropriate interaction and affect, calm/cooperative   Data Reviewed: I have personally reviewed following labs and imaging studies.  CBC: Recent Labs  Lab 10/06/19 1057 10/10/19 1508 10/11/19 0142  WBC 6.4 5.4 6.7  NEUTROABS 4,384 3.5  --   HGB 11.9 10.4* 11.2*  HCT 36.9 33.8* 35.2*  MCV 90.9 93.4 91.2  PLT 234 189 [id number]   Basic Metabolic Panel: Recent Labs  Lab 10/06/19 1057 10/10/19 1508 10/10/19 1727 10/11/19 0142  NA 133* 134*  --  133*  K 4.2 3.7  --  4.0  CL 92* 95*  --  96*  CO2 31 28  --  28  GLUCOSE 96 101*  --  109*  BUN 12 9  --  7*  CREATININE 0.79 0.90  --  0.80  CALCIUM 9.7 8.7*  --  8.9  MG 1.8  --  1.9  --   PHOS  --   --  3.9  --    GFR: Estimated Creatinine Clearance: 35.8 mL/min (by C-G formula based on SCr of 0.8 mg/dL). Liver Function Tests: Recent Labs  Lab 10/06/19 1057 10/10/19 1508 10/11/19 0142  AST 20 23 21   ALT 12 18 15   ALKPHOS  --  84 85  BILITOT 0.7 0.7 0.7  PROT 7.6 6.6 6.5  ALBUMIN  --  3.9 4.1   Recent Labs  Lab 10/10/19 1508  LIPASE 45   No results for input(s): AMMONIA in the last 168 hours. Coagulation Profile: No results for input(s): INR, PROTIME in the last 168 hours. Cardiac Enzymes: No results for input(s): CKTOTAL, CKMB, CKMBINDEX, TROPONINI in the last 168 hours. BNP (last 3 results) No results for input(s): PROBNP in the last 8760 hours. HbA1C: No results for input(s): HGBA1C in the last 72 hours. CBG: Recent Labs  Lab 10/11/19 0538  GLUCAP 85   Lipid Profile: No results for input(s): CHOL, HDL, LDLCALC, TRIG, CHOLHDL, LDLDIRECT in the last 72 hours. Thyroid Function Tests: No results for input(s): TSH, T4TOTAL, FREET4, T3FREE, THYROIDAB in the last 72 hours. Anemia Panel: No results for input(s): VITAMINB12, FOLATE, FERRITIN, TIBC, IRON, RETICCTPCT in the last 72 hours. Sepsis Labs: Recent Labs  Lab 10/10/19 1508  10/10/19 1710  LATICACIDVEN 1.2 1.5    Recent Results (from the  past 240 hour(s))  SARS CORONAVIRUS 2 (TAT 6-24 HRS) Nasopharyngeal Nasopharyngeal Swab     Status: None   Collection Time: 10/10/19  7:20 PM   Specimen: Nasopharyngeal Swab  Result Value Ref Range Status   SARS Coronavirus 2 NEGATIVE NEGATIVE Final    Comment: (NOTE) SARS-CoV-2 target nucleic acids are NOT DETECTED. The SARS-CoV-2 RNA is generally detectable in upper and lower respiratory specimens during the acute phase of infection. Negative results do not preclude SARS-CoV-2 infection, do not rule out co-infections with other pathogens, and should not be used as the sole basis for treatment or other patient management decisions. Negative results must be combined with clinical observations, patient history, and epidemiological information. The expected result is Negative. Fact Sheet for Patients: SugarRoll.be Fact Sheet for Healthcare Providers: https://www.woods-mathews.com/ This test is not yet approved or cleared by the [place] FDA and  has been authorized for detection and/or diagnosis of SARS-CoV-2 by FDA under an Emergency Use Authorization (EUA). This EUA will remain  in effect (meaning this test can be used) for the duration of the COVID-19 declaration under Section 56 4(b)(1) of the Act, 21 U.S.C. section 360bbb-3(b)(1), unless the authorization is terminated or revoked sooner. Performed at [facility], [place] [address] [place], [place] [address]       Radiology Studies: DG Chest 2 View  Result Date: 10/10/2019 CLINICAL DATA:  Syncope. EXAM: CHEST - 2 VIEW COMPARISON:  June 05, 2019. FINDINGS: Stable cardiomegaly. No pneumothorax or pleural effusion is noted. Both lungs are clear. The visualized skeletal structures are unremarkable. IMPRESSION: No active cardiopulmonary disease. Electronically Signed   By: Marijo Conception M.D.   On: 10/10/2019  15:29     Scheduled Meds: . acetaminophen  1,000 mg Oral QHS  . apixaban  2.5 mg Oral BID  . atenolol  50 mg Oral Daily  . cholecalciferol  5,000 Units Oral Daily  . gabapentin  300 mg Oral TID  . irbesartan  300 mg Oral Daily  . mirtazapine  7.5 mg Oral QHS  . pantoprazole  40 mg Oral Daily  . pravastatin  20 mg Oral q1800  . sodium chloride flush  3 mL Intravenous Q12H  . sodium chloride flush  3 mL Intravenous Q12H   Continuous Infusions: . sodium chloride       LOS: 0 days    Time spent: 25 minutes spent in the coordination of care today.    Jonnie Finner, DO [facility]  If 7PM-7AM, please contact night-coverage [email or web address] 10/11/2019, 1:29 PM

## 2019-10-11 NOTE — Care Management Obs Status (Signed)
[facility] NOTIFICATION   Patient Details  Name: Debra White MRN: [id number] Date of Birth: 1933/11/02   Medicare Observation Status Notification Given:  Yes    Carles Collet, RN 10/11/2019, 4:49 PM

## 2019-10-12 DIAGNOSIS — K219 Gastro-esophageal reflux disease without esophagitis: Secondary | ICD-10-CM | POA: Diagnosis not present

## 2019-10-12 DIAGNOSIS — I951 Orthostatic hypotension: Secondary | ICD-10-CM

## 2019-10-12 DIAGNOSIS — R531 Weakness: Secondary | ICD-10-CM | POA: Diagnosis not present

## 2019-10-12 DIAGNOSIS — I10 Essential (primary) hypertension: Secondary | ICD-10-CM | POA: Diagnosis not present

## 2019-10-12 DIAGNOSIS — E871 Hypo-osmolality and hyponatremia: Secondary | ICD-10-CM | POA: Diagnosis not present

## 2019-10-12 DIAGNOSIS — I4891 Unspecified atrial fibrillation: Secondary | ICD-10-CM | POA: Diagnosis not present

## 2019-10-12 DIAGNOSIS — R55 Syncope and collapse: Secondary | ICD-10-CM | POA: Diagnosis not present

## 2019-10-12 DIAGNOSIS — R110 Nausea: Secondary | ICD-10-CM | POA: Diagnosis not present

## 2019-10-12 DIAGNOSIS — E559 Vitamin D deficiency, unspecified: Secondary | ICD-10-CM | POA: Diagnosis not present

## 2019-10-12 DIAGNOSIS — E782 Mixed hyperlipidemia: Secondary | ICD-10-CM | POA: Diagnosis not present

## 2019-10-12 LAB — CBC WITH DIFFERENTIAL/PLATELET
Abs Immature Granulocytes: 0.02 10*3/uL (ref 0.00–0.07)
Basophils Absolute: 0.1 10*3/uL (ref 0.0–0.1)
Basophils Relative: 1 %
Eosinophils Absolute: 0.1 10*3/uL (ref 0.0–0.5)
Eosinophils Relative: 2 %
HCT: 36 % (ref 36.0–46.0)
Hemoglobin: 11.3 g/dL — ABNORMAL LOW (ref 12.0–15.0)
Immature Granulocytes: 0 %
Lymphocytes Relative: 28 %
Lymphs Abs: 1.7 10*3/uL (ref 0.7–4.0)
MCH: 29.1 pg (ref 26.0–34.0)
MCHC: 31.4 g/dL (ref 30.0–36.0)
MCV: 92.8 fL (ref 80.0–100.0)
Monocytes Absolute: 0.8 10*3/uL (ref 0.1–1.0)
Monocytes Relative: 13 %
Neutro Abs: 3.5 10*3/uL (ref 1.7–7.7)
Neutrophils Relative %: 56 %
Platelets: 220 10*3/uL (ref 150–400)
RBC: 3.88 MIL/uL (ref 3.87–5.11)
RDW: 14 % (ref 11.5–15.5)
WBC: 6.3 10*3/uL (ref 4.0–10.5)
nRBC: 0 % (ref 0.0–0.2)

## 2019-10-12 LAB — COMPREHENSIVE METABOLIC PANEL
ALT: 13 U/L (ref 0–44)
AST: 18 U/L (ref 15–41)
Albumin: 3.7 g/dL (ref 3.5–5.0)
Alkaline Phosphatase: 86 U/L (ref 38–126)
Anion gap: 9 (ref 5–15)
BUN: 9 mg/dL (ref 8–23)
CO2: 30 mmol/L (ref 22–32)
Calcium: 9.3 mg/dL (ref 8.9–10.3)
Chloride: 96 mmol/L — ABNORMAL LOW (ref 98–111)
Creatinine, Ser: 1.01 mg/dL — ABNORMAL HIGH (ref 0.44–1.00)
GFR calc Af Amer: 60 mL/min — ABNORMAL LOW (ref >60)
GFR calc non Af Amer: 51 mL/min — ABNORMAL LOW (ref >60)
Glucose, Bld: 95 mg/dL (ref 70–99)
Potassium: 4.6 mmol/L (ref 3.5–5.1)
Sodium: 135 mmol/L (ref 135–145)
Total Bilirubin: 0.7 mg/dL (ref 0.3–1.2)
Total Protein: 6.1 g/dL — ABNORMAL LOW (ref 6.5–8.1)

## 2019-10-12 LAB — MAGNESIUM: Magnesium: 1.9 mg/dL (ref 1.7–2.4)

## 2019-10-12 LAB — GLUCOSE, CAPILLARY: Glucose-Capillary: 91 mg/dL (ref 70–99)

## 2019-10-12 MED ORDER — ACETAMINOPHEN 325 MG PO TABS
650.0000 mg | ORAL_TABLET | Freq: Four times a day (QID) | ORAL | Status: DC | PRN
  Administered 2019-10-12: 650 mg via ORAL
  Filled 2019-10-12: qty 2

## 2019-10-12 MED ORDER — CLONAZEPAM 1 MG PO TABS: 0.5000 mg | ORAL_TABLET | Freq: Two times a day (BID) | ORAL | Status: DC

## 2019-10-12 MED ORDER — GABAPENTIN 600 MG PO TABS: 300.0000 mg | ORAL_TABLET | Freq: Three times a day (TID) | ORAL | Status: DC

## 2019-10-12 NOTE — Evaluation (Signed)
Occupational Therapy Evaluation Patient Details Name: Debra White MRN: [id number] DOB: 1933-06-29 Today's Date: 10/12/2019    History of Present Illness Debra Absher Osborneis a 86 y.o.femalewith medical history significant ofatrial fibrillation, prior CVA, hypertension, COPD, anxiety, on chronic anticoagulationwho presents to the hospital for generalized weakness and syncopal episode.   Clinical Impression   PTA Pt independent in ADL and mobility. 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 She would benefit from 3 in 1 for increased safety and independence with toilet transfers. OT to follow acutely to maximize safety and independence in ADL and functional transfers.    Follow Up Recommendations  Supervision - Intermittent    Equipment Recommendations  3 in 1 bedside commode    Recommendations for Other Services PT consult     Precautions / Restrictions Precautions Precautions: Fall Precaution Comments: watch HR Restrictions Weight Bearing Restrictions: No      Mobility Bed Mobility Overal bed mobility: Modified Independent             General bed mobility comments: in and out of bed, manages covers without assist  Transfers Overall transfer level: Needs assistance Equipment used: None Transfers: Sit to/from Stand;Stand Pivot Transfers Sit to Stand: Supervision Stand pivot transfers: Supervision       General transfer comment: supervision for safety from bed, toilet (use of grab bars), couch    Balance Overall balance assessment: Mild deficits observed, not formally tested                                         ADL either performed or assessed with clinical judgement   ADL Overall ADL's : Needs assistance/impaired Eating/Feeding: Independent   Grooming: Supervision/safety;Standing;Wash/dry hands;Wash/dry face;Oral care Grooming  Details (indicate cue type and reason): no LOB or SOB noted Upper Body Bathing: Supervision/ safety;Sitting   Lower Body Bathing: Supervison/ safety   Upper Body Dressing : Modified independent   Lower Body Dressing: Modified independent;Sit to/from stand   Toilet Transfer: Min guard;Ambulation;Grab bars Toilet Transfer Details (indicate cue type and reason): would benefit from 3 in 1 as higher surface, and for arm rest to aide with sit<>stand transfer Debra White and Hygiene: Supervision/safety;Sit to/from stand   Tub/ Shower Transfer: Walk-in shower;Supervision/safety;Shower seat;Grab Paediatric nurse Details (indicate cue type and reason): verbally walked me through her shower ritual for safe entry/exit   General ADL Comments: supervision for safety     Vision Baseline Vision/History: Wears glasses Wears Glasses: Reading only Patient Visual Report: No change from baseline Vision Assessment?: No apparent visual deficits     Perception     Praxis      Pertinent Vitals/Pain Pain Assessment: No/denies pain     Hand Dominance Right   Extremity/Trunk Assessment Upper Extremity Assessment Upper Extremity Assessment: Generalized weakness   Lower Extremity Assessment Lower Extremity Assessment: Defer to PT evaluation       Communication Communication Communication: No difficulties   Cognition Arousal/Alertness: Awake/alert Behavior During Therapy: Anxious Overall Cognitive Status: Within Functional Limits for tasks assessed                                 General Comments: for basic functional ADL tasks, good safety awareness   General Comments  HR  60 at rest, 80's with activity    Exercises     Shoulder Instructions      Home Living Family/patient expects to be discharged to:: Private residence Living Arrangements: Spouse/significant other Available Help at Discharge: Family;Available 24 hours/day(brother lives next  door, daughter close by) Type of Home: House Home Access: [place]: One level     Bathroom Shower/Tub: Occupational psychologist: [place]: Environmental consultant - 2 wheels;Grab bars - tub/shower   Additional Comments: 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 (he is supervision for ADL and mobility)      Prior Functioning/Environment Level of Independence: Independent        Comments: drives, cooks, cleans, 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, does online grocery shopping,         OT Problem List: Decreased activity tolerance;Impaired balance (sitting and/or standing);Decreased safety awareness;Decreased knowledge of use of DME or AE      OT Treatment/Interventions: Self-care/ADL training    OT Goals(Current goals can be found in the care plan section) Acute Rehab OT Goals Patient Stated Goal: to get home to husband OT Goal Formulation: With patient Time For Goal Achievement: 10/26/19 Potential to Achieve Goals: Good ADL Goals Pt Will Perform Grooming: with modified independence;standing Pt Will Perform Upper Body Dressing: with modified independence;sitting Pt Will Perform Lower Body Dressing: with modified independence;sit to/from stand Pt Will Transfer to Toilet: with modified independence;ambulating Pt Will Perform Toileting - Clothing Manipulation and hygiene: with modified independence;sit to/from stand Additional ADL Goal #1: Pt will recall 3 ways of conserving energy during ADL routine and IADL around the house without any cues  OT Frequency: Min 2X/week   Barriers to D/C:            Co-evaluation              AM-PAC OT "6 Clicks" Daily Activity     Outcome Measure Help from another person eating meals?: None Help from another person taking care of personal grooming?: None Help from another person toileting, which includes using toliet, bedpan, or urinal?: None Help from another person bathing (including washing,  rinsing, drying)?: None Help from another person to put on and taking off regular upper body clothing?: None Help from another person to put on and taking off regular lower body clothing?: None 6 Click Score: 24   End of Session Equipment Utilized During Treatment: Gait belt Nurse Communication: Mobility status  Activity Tolerance: Patient tolerated treatment well Patient left: in bed;with call bell/phone within reach;with bed alarm set  OT Visit Diagnosis: Unsteadiness on feet (R26.81);History of falling (Z91.81);Muscle weakness (generalized) (M62.81)                Time: [id number] OT Time Calculation (min): 30 min Charges:  OT General Charges $OT Visit: 1 Visit OT Evaluation $OT Eval Low Complexity: [place] OTR/L Acute Rehabilitation Services Pager: [phone] Office: [place] 10/12/2019, 10:29 AM

## 2019-10-12 NOTE — Discharge Summary (Signed)
. Physician Discharge Summary  Debra White [id number] DOB: 06/27/1933 DOA: 10/10/2019  PCP: Unk Pinto, MD  Admit date: 10/10/2019 Discharge date: 10/12/2019  Admitted From: Home Disposition:  Discharged to home with [facility]  Recommendations for Outpatient Follow-up:  1. Follow up with PCP in 5 - 7 days.  2. Please obtain BMP/CBC in one week  Discharge Condition: Stable  CODE STATUS: DNR   Brief/Interim Summary: Debra Absher Osborneis a 86 y.o.femalewith medical history significant ofatrial fibrillation, prior CVA, hypertension, COPD, anxiety, on chronic anticoagulationwho presents to the hospital for generalized weakness. She was recently seen by her cardiology group due to weakness with noted heart rate of 44 so she was told to stop her digoxin and decrease her atenololwhich she reports that she has done. This afternoon she remained weak so she called EMS. EMS reported syncopal event as she was walking to the EMS truck. Regarding her weakness patient reports this has been going on for a couple of days however worsened this afternoon after returning home from lunch.She is unable to identify any new changes, exacerbating, or remitting factors. She does admit to associated epigastric discomfort, but denies any chest pain or radiation of the discomfort. She denies any dyspnea, LE edema, dysuria, fever, chills, cough. She does admit to nausea with an episode of vomiting this evening.  4/19: Orthostatics with PT are positive. Will send home with compression stockings order. PT recs HHPT. Will also get 3-n-1 and RW. We'll see if we can get her someone to help with her medicine. She has family next door that will check in on her. She needs to follow up with her PCP in 5 - 7 days and follow up with cardiology as scheduled.   Discharge Diagnoses:  Active Problems:   Hypertension   Hyperlipidemia   GERD   Vitamin D deficiency   Chronic hyponatremia   Atrial fibrillation with  controlled ventricular response (HCC)   Syncope  Syncope     - at admission she was bradycardic     - BP was actually high and orthostatics negative     - per dtr, dig was stopped by MD on Friday and her dig level at admission was 0.8 (low normal)     - echo: Left ventricular ejection fraction, by estimation, is 55 to 60%. The left ventricle has normal function. The left ventricle has no regional  wall motion abnormalities. Left ventricular diastolic function could not  be evaluated.      - currently tolerating atenolol 25 mg     - she has no focality on exam     - she does report nausea today, but can't tell me that she has been having nausea prior to admission     - 4/19: two issues. Her HR at admission and she has some orthostatics. She should go home on 50mg  of atenolol and compression stockings. She will need to follow up with her PCP in 5 - 7 days and follow up with cardiology as scheduled.   Hypertension     - Continue ARB and atenolol     - continue to hold lasix as she has no edema and she is hyponatremic     - 4/19: hold lasix at discharge; continue olmesartan and atenolol  GERD     - continue protonix  HLD     - continue pravastatin  Vitamin D deficiency     - continue Vitamin D replacement  Atrial fibrillation     - Continue  atenolol at decreased dose     - Monitor on telemetry     - Continue AC with eliquis (2.5 mg BID given age and weight)     - 4/19: continue atenolol 50mg  qday   Chronic Hyponatremia     - Stable  Weakness     - Work up as above     - Mg2+ and Phos are ok     - She has had a recent TSH on 10/06/19 so will not repeat     - PT/OT to see     - PT recs HHPT, RW; OT recs 3-n-1  Nausea     - UA negative, no temp; check KUB  Discharge Instructions   Allergies as of 10/12/2019      Reactions   Latex Hives   TONGUE HAD BLISTERS WHEN HAD DENTAL SURGERY   Amoxicillin Nausea And Vomiting   Aspirin Other (See Comments)   Gi upset    Barbiturates Other (See Comments)   Unknown reaction   Celebrex [celecoxib] Other (See Comments)   Unknown reaction   Codeine Itching   Evista [raloxifene] Other (See Comments)   Unknown reaction   Lipitor [atorvastatin] Other (See Comments)   Unknown reaction   Morphine And Related Other (See Comments)   Unknown reaction   Oruvail [ketoprofen] Other (See Comments)   Unknown reaction   Pantoprazole Diarrhea   Paraffin Other (See Comments)   Unknown reaction   Prochlorperazine Other (See Comments)   Uncontrolled shaking   Proloprim [trimethoprim] Other (See Comments)   Unknown reaction   Venlafaxine Nausea Only   Vibra-tab [doxycycline] Other (See Comments)   Unknown reaction   Vioxx [rofecoxib] Other (See Comments)   edema   Betadine [povidone Iodine] Rash   Caffeine Palpitations      Medication List    STOP taking these medications   furosemide 40 MG tablet Commonly known as: LASIX   mirtazapine 15 MG tablet Commonly known as: Remeron     TAKE these medications   acetaminophen 500 MG tablet Commonly known as: TYLENOL Take 500-1,000 mg by mouth See admin instructions. Take 1 tablet (500 mg) by mouth every morning, take 2 tablets (1000 mg) with lunch and 1 tablet (500 mg) at night   apixaban 5 MG Tabs tablet Commonly known as: Eliquis Take 1 tablet 2 x /day to Prevent Blood Clots What changed:   how much to take  how to take this  when to take this  additional instructions   Artificial Tears 1.4 % ophthalmic solution Generic drug: polyvinyl alcohol Place 1 drop into both eyes 2 (two) times daily as needed for dry eyes.   atenolol 100 MG tablet Commonly known as: TENORMIN Take 0.5 tablets (50 mg total) by mouth daily.   clonazePAM 1 MG tablet Commonly known as: KLONOPIN Take 0.5 tablets (0.5 mg total) by mouth 2 (two) times daily.   conjugated estrogens vaginal cream Commonly known as: PREMARIN Place 1 Applicatorful vaginally See admin  instructions. Apply one applicatorful vaginally twice weekly - every Wednesday and Saturday night   diclofenac Sodium 1 % Gel Commonly known as: VOLTAREN Apply 2 grams 3 to 4 x / day as needed for Pain What changed:   how much to take  how to take this  when to take this  reasons to take this  additional instructions   dicyclomine 20 MG tablet Commonly known as: BENTYL Take 1 tablet 4 x /day before Meals & Bedtime if needed for  Nausea, Cramping, Bloating or Diarrhea What changed:   how much to take  how to take this  when to take this  additional instructions   gabapentin 600 MG tablet Commonly known as: NEURONTIN Take 0.5 tablets (300 mg total) by mouth 3 (three) times daily. For facial pain   lovastatin 20 MG tablet Commonly known as: MEVACOR Take 1 tablet at Bedtime for Cholesterol What changed:   how much to take  how to take this  when to take this  additional instructions   olmesartan 40 MG tablet Commonly known as: BENICAR Take 1 tablet Daily for BP What changed:   how much to take  how to take this  when to take this  additional instructions   omeprazole 40 MG capsule Commonly known as: PRILOSEC Take 1 capsule Daily for Indigestion & Heartburn What changed:   how much to take  how to take this  when to take this  additional instructions   Vitamin D-3 125 MCG (5000 UT) Tabs Take 5,000 Units by mouth daily.            Durable Medical Equipment  (From admission, onward)         Start     Ordered   10/12/19 1500  For home use only DME Walker rolling  Once    Question Answer Comment  Walker: With 5 Inch Wheels   Patient needs a walker to treat with the following condition Generalized weakness      10/12/19 1500   10/12/19 1247  For home use only DME 3 n 1  Once     10/12/19 1246          Allergies  Allergen Reactions  . Latex Hives    TONGUE HAD BLISTERS WHEN HAD DENTAL SURGERY  . Amoxicillin Nausea And  Vomiting  . Aspirin Other (See Comments)    Gi upset  . Barbiturates Other (See Comments)    Unknown reaction  . Celebrex [Celecoxib] Other (See Comments)    Unknown reaction  . Codeine Itching  . Evista [Raloxifene] Other (See Comments)    Unknown reaction  . Lipitor [Atorvastatin] Other (See Comments)    Unknown reaction  . Morphine And Related Other (See Comments)    Unknown reaction  . Oruvail [Ketoprofen] Other (See Comments)    Unknown reaction  . Pantoprazole Diarrhea  . Paraffin Other (See Comments)    Unknown reaction  . Prochlorperazine Other (See Comments)    Uncontrolled shaking  . Proloprim [Trimethoprim] Other (See Comments)    Unknown reaction  . Venlafaxine Nausea Only  . Vibra-Tab [Doxycycline] Other (See Comments)    Unknown reaction  . Vioxx [Rofecoxib] Other (See Comments)    edema  . Betadine [Povidone Iodine] Rash  . Caffeine Palpitations    Procedures/Studies: DG Chest 2 View  Result Date: 10/10/2019 CLINICAL DATA:  Syncope. EXAM: CHEST - 2 VIEW COMPARISON:  June 05, 2019. FINDINGS: Stable cardiomegaly. No pneumothorax or pleural effusion is noted. Both lungs are clear. The visualized skeletal structures are unremarkable. IMPRESSION: No active cardiopulmonary disease. Electronically Signed   By: Marijo Conception M.D.   On: 10/10/2019 15:29   DG Abd 1 View  Result Date: 10/11/2019 CLINICAL DATA:  Intractable nausea and vomiting EXAM: ABDOMEN - 1 VIEW COMPARISON:  Noncontrast CT 06/05/2019 FINDINGS: Normal bowel gas pattern. No bowel dilatation to suggest obstruction. Small volume of stool in the colon. No radiopaque calculi or abnormal soft tissue calcifications. No free air on  portable supine view. Postsurgical change of the lower lumbar spine. No acute osseous abnormalities are seen. IMPRESSION: Normal bowel gas pattern. No explanation for nausea and vomiting on portable AP radiograph. Electronically Signed   By: Keith Rake M.D.   On: 10/11/2019  16:07   ECHOCARDIOGRAM COMPLETE  Result Date: 10/11/2019    ECHOCARDIOGRAM REPORT   Patient Name:   RUDEAN AZBELL Date of Exam: 10/11/2019 Medical Rec #:  [id number]      Height:       64.0 in Accession #:    [id number]     Weight:       93.9 lb Date of Birth:  01/08/1934       BSA:          1.419 m Patient Age:    16 years       BP:           148/72 mmHg Patient Gender: F              HR:           66 bpm. Exam Location:  Inpatient Procedure: 2D Echo, Cardiac Doppler and Color Doppler Indications:    R55 Syncope  History:        Patient has prior history of Echocardiogram examinations, most                 recent 03/23/2018.  Sonographer:    Merrie Roof RDCS Referring Phys: [id number] [place]  1. Left ventricular ejection fraction, by estimation, is 55 to 60%. The left ventricle has normal function. The left ventricle has no regional wall motion abnormalities. Left ventricular diastolic function could not be evaluated.  2. Right ventricular systolic function is normal. The right ventricular size is normal. There is normal pulmonary artery systolic pressure. The estimated right ventricular systolic pressure is 123XX123 mmHg.  3. Left atrial size was severely dilated.  4. Right atrial size was severely dilated.  5. The mitral valve is grossly normal. Mild mitral valve regurgitation. No evidence of mitral stenosis.  6. Tricuspid valve regurgitation is moderate.  7. The aortic valve is tricuspid. Aortic valve regurgitation is not visualized. Mild aortic valve sclerosis is present, with no evidence of aortic valve stenosis.  8. The inferior vena cava is normal in size with greater than 50% respiratory variability, suggesting right atrial pressure of 3 mmHg. Comparison(s): No significant change from prior study. FINDINGS  Left Ventricle: Left ventricular ejection fraction, by estimation, is 55 to 60%. The left ventricle has normal function. The left ventricle has no regional wall motion abnormalities. The  left ventricular internal cavity size was normal in size. There is  no left ventricular hypertrophy. Left ventricular diastolic function could not be evaluated due to atrial fibrillation. Left ventricular diastolic function could not be evaluated. Right Ventricle: The right ventricular size is normal. No increase in right ventricular wall thickness. Right ventricular systolic function is normal. There is normal pulmonary artery systolic pressure. The tricuspid regurgitant velocity is 2.61 m/s, and  with an assumed right atrial pressure of 3 mmHg, the estimated right ventricular systolic pressure is 123XX123 mmHg. Left Atrium: Left atrial size was severely dilated. Right Atrium: Right atrial size was severely dilated. Pericardium: A small pericardial effusion is present. The pericardial effusion is circumferential. Mitral Valve: The mitral valve is grossly normal. Mild mitral valve regurgitation. No evidence of mitral valve stenosis. Tricuspid Valve: The tricuspid valve is grossly normal. Tricuspid valve regurgitation is moderate. Aortic Valve:  The aortic valve is tricuspid. Aortic valve regurgitation is not visualized. Mild aortic valve sclerosis is present, with no evidence of aortic valve stenosis. Pulmonic Valve: The pulmonic valve was grossly normal. Pulmonic valve regurgitation is mild. No evidence of pulmonic stenosis. Aorta: The aortic root and ascending aorta are structurally normal, with no evidence of dilitation. Venous: The inferior vena cava is normal in size with greater than 50% respiratory variability, suggesting right atrial pressure of 3 mmHg. IAS/Shunts: The atrial septum is grossly normal.  LEFT VENTRICLE PLAX 2D LVOT diam:     1.90 cm LV SV:         51 LV SV Index:   36 LVOT Area:     2.84 cm  LV Volumes (MOD) LV vol d, MOD A4C: 58.6 ml LV vol s, MOD A4C: 23.7 ml LV SV MOD A4C:     58.6 ml RIGHT VENTRICLE            IVC RV Basal diam:  3.50 cm    IVC diam: 2.10 cm RV S prime:     9.36 cm/s TAPSE  (M-mode): 2.1 cm LEFT ATRIUM             Index       RIGHT ATRIUM           Index LA Vol (A2C):   84.1 ml 59.28 ml/m RA Area:     24.60 cm LA Vol (A4C):   74.2 ml 52.30 ml/m RA Volume:   76.10 ml  53.64 ml/m LA Biplane Vol: 82.5 ml 58.15 ml/m  AORTIC VALVE LVOT Vmax:   82.50 cm/s LVOT Vmean:  51.500 cm/s LVOT VTI:    0.181 m  AORTA Ao Asc diam: 3.40 cm TRICUSPID VALVE TR Peak grad:   27.2 mmHg TR Vmax:        261.00 cm/s  SHUNTS Systemic VTI:  0.18 m Systemic Diam: 1.90 cm Eleonore Chiquito MD Electronically signed by Eleonore Chiquito MD Signature Date/Time: 10/11/2019/1:38:25 PM    Final       Subjective: "I'm taking what they tell me."  Discharge Exam: Vitals:   10/12/19 0743 10/12/19 1230  BP: 135/71 (!) 114/49  Pulse: 91 65  Resp: 18 16  Temp:  97.7 F (36.5 C)  SpO2: 95% 99%   Vitals:   10/12/19 0047 10/12/19 0455 10/12/19 0743 10/12/19 1230  BP: (!) 111/55 (!) 111/55 135/71 (!) 114/49  Pulse: 66 63 91 65  Resp: 17 17 18 16   Temp: 98 F (36.7 C) 98.3 F (36.8 C)  97.7 F (36.5 C)  TempSrc: Oral Oral  Oral  SpO2: 97% 94% 95% 99%  Weight:  41.8 kg    Height:        General: 86 y.o. female resting in bed in NAD Cardiovascular: RRR, +S1, S2, no m/g/r Respiratory: CTABL, no w/r/r, normal WOB GI: BS+, NDNT, no masses noted, no organomegaly noted MSK: No e/c/c Neuro: Alert to name, follows commands Psyc: Appropriate interaction and affect, calm/cooperative   The results of significant diagnostics from this hospitalization (including imaging, microbiology, ancillary and laboratory) are listed below for reference.     Microbiology: Recent Results (from the past 240 hour(s))  SARS CORONAVIRUS 2 (TAT 6-24 HRS) Nasopharyngeal Nasopharyngeal Swab     Status: None   Collection Time: 10/10/19  7:20 PM   Specimen: Nasopharyngeal Swab  Result Value Ref Range Status   SARS Coronavirus 2 NEGATIVE NEGATIVE Final    Comment: (NOTE) SARS-CoV-2 target nucleic acids are  NOT  DETECTED. The SARS-CoV-2 RNA is generally detectable in upper and lower respiratory specimens during the acute phase of infection. Negative results do not preclude SARS-CoV-2 infection, do not rule out co-infections with other pathogens, and should not be used as the sole basis for treatment or other patient management decisions. Negative results must be combined with clinical observations, patient history, and epidemiological information. The expected result is Negative. Fact Sheet for Patients: SugarRoll.be Fact Sheet for Healthcare Providers: https://www.woods-mathews.com/ This test is not yet approved or cleared by the [place] FDA and  has been authorized for detection and/or diagnosis of SARS-CoV-2 by FDA under an Emergency Use Authorization (EUA). This EUA will remain  in effect (meaning this test can be used) for the duration of the COVID-19 declaration under Section 56 4(b)(1) of the Act, 21 U.S.C. section 360bbb-3(b)(1), unless the authorization is terminated or revoked sooner. Performed at [facility], [place] [address] [place], [place] [address]      Labs: BNP (last 3 results) No results for input(s): BNP in the last 8760 hours. Basic Metabolic Panel: Recent Labs  Lab 10/06/19 1057 10/10/19 1508 10/10/19 1727 10/11/19 0142 10/12/19 0644  NA 133* 134*  --  133* 135  K 4.2 3.7  --  4.0 4.6  CL 92* 95*  --  96* 96*  CO2 31 28  --  28 30  GLUCOSE 96 101*  --  109* 95  BUN 12 9  --  7* 9  CREATININE 0.79 0.90  --  0.80 1.01*  CALCIUM 9.7 8.7*  --  8.9 9.3  MG 1.8  --  1.9  --  1.9  PHOS  --   --  3.9  --   --    Liver Function Tests: Recent Labs  Lab 10/06/19 1057 10/10/19 1508 10/11/19 0142 10/12/19 0644  AST 20 23 21 18   ALT 12 18 15 13   ALKPHOS  --  84 85 86  BILITOT 0.7 0.7 0.7 0.7  PROT 7.6 6.6 6.5 6.1*  ALBUMIN  --  3.9 4.1 3.7   Recent Labs  Lab 10/10/19 1508  LIPASE 45   No results for  input(s): AMMONIA in the last 168 hours. CBC: Recent Labs  Lab 10/06/19 1057 10/10/19 1508 10/11/19 0142 10/12/19 0644  WBC 6.4 5.4 6.7 6.3  NEUTROABS 4,384 3.5  --  3.5  HGB 11.9 10.4* 11.2* 11.3*  HCT 36.9 33.8* 35.2* 36.0  MCV 90.9 93.4 91.2 92.8  PLT 234 189 210 220   Cardiac Enzymes: No results for input(s): CKTOTAL, CKMB, CKMBINDEX, TROPONINI in the last 168 hours. BNP: Invalid input(s): POCBNP CBG: Recent Labs  Lab 10/11/19 0538 10/12/19 0603  GLUCAP 85 91   D-Dimer No results for input(s): DDIMER in the last 72 hours. Hgb A1c No results for input(s): HGBA1C in the last 72 hours. Lipid Profile No results for input(s): CHOL, HDL, LDLCALC, TRIG, CHOLHDL, LDLDIRECT in the last 72 hours. Thyroid function studies No results for input(s): TSH, T4TOTAL, T3FREE, THYROIDAB in the last 72 hours.  Invalid input(s): FREET3 Anemia work up No results for input(s): VITAMINB12, FOLATE, FERRITIN, TIBC, IRON, RETICCTPCT in the last 72 hours. Urinalysis    Component Value Date/Time   COLORURINE STRAW (A) 10/10/2019 1727   APPEARANCEUR CLEAR 10/10/2019 1727   LABSPEC 1.004 (L) 10/10/2019 1727   PHURINE 9.0 (H) 10/10/2019 1727   GLUCOSEU NEGATIVE 10/10/2019 1727   HGBUR NEGATIVE 10/10/2019 1727   BILIRUBINUR NEGATIVE 10/10/2019 1727  KETONESUR NEGATIVE 10/10/2019 1727   PROTEINUR NEGATIVE 10/10/2019 1727   UROBILINOGEN 0.2 04/13/2015 1700   NITRITE NEGATIVE 10/10/2019 1727   LEUKOCYTESUR NEGATIVE 10/10/2019 1727   Sepsis Labs Invalid input(s): PROCALCITONIN,  WBC,  LACTICIDVEN Microbiology Recent Results (from the past 240 hour(s))  SARS CORONAVIRUS 2 (TAT 6-24 HRS) Nasopharyngeal Nasopharyngeal Swab     Status: None   Collection Time: 10/10/19  7:20 PM   Specimen: Nasopharyngeal Swab  Result Value Ref Range Status   SARS Coronavirus 2 NEGATIVE NEGATIVE Final    Comment: (NOTE) SARS-CoV-2 target nucleic acids are NOT DETECTED. The SARS-CoV-2 RNA is generally  detectable in upper and lower respiratory specimens during the acute phase of infection. Negative results do not preclude SARS-CoV-2 infection, do not rule out co-infections with other pathogens, and should not be used as the sole basis for treatment or other patient management decisions. Negative results must be combined with clinical observations, patient history, and epidemiological information. The expected result is Negative. Fact Sheet for Patients: SugarRoll.be Fact Sheet for Healthcare Providers: https://www.woods-mathews.com/ This test is not yet approved or cleared by the [place] FDA and  has been authorized for detection and/or diagnosis of SARS-CoV-2 by FDA under an Emergency Use Authorization (EUA). This EUA will remain  in effect (meaning this test can be used) for the duration of the COVID-19 declaration under Section 56 4(b)(1) of the Act, 21 U.S.C. section 360bbb-3(b)(1), unless the authorization is terminated or revoked sooner. Performed at [facility], [place] [address] [place], [place] [address]      Time coordinating discharge: 35 minutes  SIGNED:   Jonnie Finner, DO  [facility] 10/12/2019, 2:59 PM   If 7PM-7AM, please contact night-coverage [email or web address]

## 2019-10-12 NOTE — TOC Initial Note (Signed)
Transition of Care [facility]) - Initial/Assessment Note    Patient Details  Name: Debra White MRN: [id number] Date of Birth: 03-30-1934  Transition of [facility]) CM/SW Contact:    Alexander Mt, LCSW Phone Number: 10/12/2019, 3:25 PM  Clinical Narrative:                 CSW called into room to speak w/ pt. CSW introduced self, role, reason for call. Pt from home w/ spouse, she normally gets around with little assistance. We discussed [place] recs, pt has only ever been to OP PT. She is agreeable, has no preference, and states she will be discharging home w/ her sister (her brother will pick her up) and requests I give her a call. Pt does not have a walker and is unsure about the commode and is agreeable to both being ordered. Pt agreeable to PCP appointment to be made.  CSW called and spoke w/ pt sister Olegario Shearer at [phone]. Olegario Shearer aware of discharge today, she has had [facility] herself before but unsure who her provider was. CSW made referral to [facility], accepted for PT and RN (chronic mgmt). CSW updated [facility] w/ address of discharge, [place], [place], [address]. DME ordered through [place] [facility], bedside RN Langley Gauss also updated that pt has had these services arranged.   Expected Discharge Plan: [place] Barriers to Discharge: Barriers Resolved   Patient Goals and CMS Choice Patient states their goals for this hospitalization and ongoing recovery are:: get my strength back at my sisters CMS Medicare.gov Compare Post Acute Care list provided to:: Patient Choice offered to / list presented to : Patient  Expected Discharge Plan and Services Expected Discharge Plan: [place] In-house Referral: Clinical Social Work Discharge Planning Services: CM Consult Post Acute Care Choice: Durable Medical Equipment, Home Health Living arrangements for the past 2 months: Single Family Home Expected Discharge Date: 10/12/19               DME Arranged: Berta Minor rolling DME Agency: [employer] Date DME Agency Contacted: 10/12/19 Time DME Agency Contacted: [phone] Representative spoke with at DME Agency: [place] HH Arranged: RN, PT [place] Agency: [place] Date [place]: 10/12/19 Time [place]: 84 Representative spoke with at [place]: Adela Lank  Prior Living Arrangements/Services Living arrangements for the past 2 months: [place] with:: Spouse Patient language and need for interpreter reviewed:: Yes(no needs) Do you feel safe going back to the place where you live?: Yes      Need for Family Participation in Patient Care: Yes (Comment)(assistance w/ daily cares as needed) Care giver support system in place?: Yes (comment)(siblings)   Criminal Activity/Legal Involvement Pertinent to Current Situation/Hospitalization: No - Comment as needed  Activities of Daily Living Home Assistive Devices/Equipment: None ADL Screening (condition at time of admission) Patient's cognitive ability adequate to safely complete daily activities?: Yes Is the patient deaf or have difficulty hearing?: Yes Does the patient have difficulty seeing, even when wearing glasses/contacts?: No Does the patient have difficulty concentrating, remembering, or making decisions?: Yes Patient able to express need for assistance with ADLs?: Yes Does the patient have difficulty dressing or bathing?: Yes Independently performs ADLs?: Yes (appropriate for developmental age) Does the patient have difficulty walking or climbing stairs?: No Weakness of Legs: None Weakness of Arms/Hands: None  Permission Sought/Granted Permission sought to share information with : Family Supports Permission granted to share  information with : Yes, Verbal Permission Granted  Share Information with NAME: Mikael Spray  Permission granted to share info w AGENCY: [place] granted to share info w Relationship: sister  Permission  granted to share info w Contact Information: [phone]  Emotional Assessment Appearance:: Other (Comment Required(telephonic assessment) Attitude/Demeanor/Rapport: Engaged, Other (comment)(telephonic assessment) Affect (typically observed): Accepting, Adaptable, Appropriate, Other (comment)(telephonic assessment) Orientation: : Oriented to Self, Oriented to Place, Oriented to  Time, Oriented to Situation Alcohol / Substance Use: Not Applicable Psych Involvement: (n/a)  Admission diagnosis:  Syncope and collapse [R55] Syncope [R55] Patient Active Problem List   Diagnosis Date Noted  . Syncope 10/10/2019  . Diastolic CHF ([place]) XX123456  . History of embolic stroke XX123456  . Atrial fibrillation with controlled ventricular response ([place]) 03/12/2019  . Coronary atherosclerosis due to lipid rich plaque 03/26/2018  . Renal infarct ([place]) 03/22/2018  . Chronic hyponatremia 03/22/2018  . Chronic hyperkalemia 03/22/2018  . Hepatic steatosis 11/17/2017  . Aortic atherosclerosis ([place]) 08/08/2017  . COPD (chronic obstructive pulmonary disease) with emphysema ([place]) 12/21/2015  . 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 Osteoporosis 12/21/2015  . Mild malnutrition ([place]) 05/11/2015  . Encounter for Medicare annual wellness exam 04/08/2015  . Medication management 01/27/2014  . 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 Vitamin D deficiency 07/08/2013  . Hypertension   . Hyperlipidemia   . GERD   . Headache, post-traumatic, chronic 09/20/2011   PCP:  Unk Pinto, MD Pharmacy:   [facility], [address] [place] [address] Phone: [phone] Fax: [phone]  [id number] - [place], [address] [place] [address] Phone: [phone] Fax: [phone]   Readmission Risk Interventions No flowsheet data found.

## 2019-10-12 NOTE — Evaluation (Signed)
Physical Therapy Evaluation Patient Details Name: Debra White MRN: [id number] DOB: 02-Sep-1933 Today's Date: 10/12/2019   History of Present Illness  Pt is 86 yo female with PMH including afib, prior CVA, HTN, COPD, anxiety, who presented to hospital with weakness and syncope.  Clinical Impression  Pt admitted with above diagnosis. Pt presenting with mild weakness and instability.  Pt was able to ambulate on level surfaces without LOB, but did have difficulty with LOB with dynamic gait task.  Advised use of RW at this time and HHPT.  Pt currently with functional limitations due to the deficits listed below (see PT Problem List). Pt will benefit from skilled PT to increase their independence and safety with mobility to allow discharge to the venue listed below.       Follow Up Recommendations Home health PT    Equipment Recommendations  Rolling walker with 5" wheels;3in1 (PT)    Recommendations for Other Services       Precautions / Restrictions Precautions Precautions: Fall Precaution Comments: watch HR Restrictions Weight Bearing Restrictions: No      Mobility  Bed Mobility Overal bed mobility: Modified Independent             General bed mobility comments: in and out of bed, manages covers without assist  Transfers Overall transfer level: Needs assistance Equipment used: None Transfers: Sit to/from Stand;Stand Pivot Transfers Sit to Stand: Supervision Stand pivot transfers: Supervision       General transfer comment: multiple sit to stand with supervision for safety; performed toielting ADLs independently  Ambulation/Gait Ambulation/Gait assistance: Min guard;Min assist Gait Distance (Feet): 300 Feet Assistive device: None Gait Pattern/deviations: Step-through pattern;Decreased stride length;Narrow base of support Gait velocity: decreased   General Gait Details: Pt requiring min guard for safety with ambulation.  With dynamic (head turns, stepping over  object)gait she had 3 LOB requiring min-mod A for balance.  Stairs            Wheelchair Mobility    Modified Rankin (Stroke Patients Only)       Balance Overall balance assessment: Needs assistance Sitting-balance support: No upper extremity supported;Feet supported Sitting balance-Leahy Scale: Normal       Standing balance-Leahy Scale: Fair Standing balance comment: Had LOB with small pertubation when applying gait belt                 Standardized Balance Assessment Standardized Balance Assessment : Dynamic Gait Index   Dynamic Gait Index Level Surface: Mild Impairment Change in Gait Speed: Mild Impairment Gait with Horizontal Head Turns: Severe Impairment Gait with Vertical Head Turns: Severe Impairment Gait and Pivot Turn: Mild Impairment Step Over Obstacle: Severe Impairment Step Around Obstacles: Mild Impairment Steps: Moderate Impairment Total Score: 9       Pertinent Vitals/Pain Pain Assessment: Faces Faces Pain Scale: Hurts a little bit Pain Location: face/head hurting (reports nerve damage in face from fall 5 years ago) Pain Descriptors / Indicators: Aching Pain Intervention(s): Limited activity within patient's tolerance;Monitored during session    [place] expects to be discharged to:: Private residence Living Arrangements: Spouse/significant other Available Help at Discharge: Family;Available PRN/intermittently(brother lives next door; daughter close by) Type of Home: House Home Access: [place]: One level Home Equipment: Grab bars - tub/shower;Shower seat Additional Comments: 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 (he is supervision for ADL and mobility)    Prior Function Level of Independence: Independent with assistive device(s);Needs assistance   Gait / Transfers  Assistance Needed: ambulates in community without AD  ADL's / Homemaking Assistance Needed: Independent with ADLs and  IADLs  Comments: Doesn't like to drive in traffic so family takes to appointments;  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 (but he only needs supervision), does online grocery shopping; denies falls other than the one from syncope that brought her to the hospital     Hand Dominance        Extremity/Trunk Assessment   Upper Extremity Assessment Upper Extremity Assessment: Generalized weakness    Lower Extremity Assessment Lower Extremity Assessment: Generalized weakness    Cervical / Trunk Assessment Cervical / Trunk Assessment: Normal  Communication   Communication: HOH  Cognition Arousal/Alertness: Awake/alert Behavior During Therapy: Anxious Overall Cognitive Status: Within Functional Limits for tasks assessed                                 General Comments: for basic functional ADL tasks, good safety awareness ; tangential - focused on her medications      General Comments General comments (skin integrity, edema, etc.): Pt was educated on recommendation for HHPT and use of RW due to decreased balance. Pt reports not sure about using RW - advised to use at this time, but may be short term pending progress with HHPT.  Orthostatic BPs  Supine 158/62 and HR 55  Sitting 157/61 and HR 62  Standing  128/78 and HR 58  Standing after 3 min  125/78 and HR 58  HR was 55 bpm rest and up to 80 bpm with walking        Exercises     Assessment/Plan    PT Assessment Patient needs continued PT services  PT Problem List Decreased mobility;Decreased strength;Decreased safety awareness;Decreased range of motion;Decreased activity tolerance;Cardiopulmonary status limiting activity;Decreased balance;Decreased knowledge of use of DME       PT Treatment Interventions DME instruction;Therapeutic activities;Gait training;Patient/family education;Therapeutic exercise;Stair training;Balance training;Functional mobility training    PT Goals (Current goals can be found  in the Care Plan section)  Acute Rehab PT Goals Patient Stated Goal: to get home to husband PT Goal Formulation: With patient Time For Goal Achievement: 10/26/19 Potential to Achieve Goals: Good Additional Goals Additional Goal #1: Pt will have DGI score of >19 to indicate low risk of fall    Frequency Min 3X/week   Barriers to discharge Decreased caregiver support      Co-evaluation               AM-PAC PT "6 Clicks" Mobility  Outcome Measure Help needed turning from your back to your side while in a flat bed without using bedrails?: None Help needed moving from lying on your back to sitting on the side of a flat bed without using bedrails?: None Help needed moving to and from a bed to a chair (including a wheelchair)?: None Help needed standing up from a chair using your arms (e.g., wheelchair or bedside chair)?: None Help needed to walk in hospital room?: A Little Help needed climbing 3-5 steps with a railing? : A Little 6 Click Score: 22    End of Session Equipment Utilized During Treatment: Gait belt Activity Tolerance: Patient tolerated treatment well Patient left: in bed;with call bell/phone within reach;with bed alarm set Nurse Communication: Mobility status;Other (comment)(BPs) PT Visit Diagnosis: Unsteadiness on feet (R26.81);Muscle weakness (generalized) (M62.81)    Time: 1250-1330 PT Time Calculation (min) (ACUTE ONLY): 40 min  Charges:   PT Evaluation $PT Eval Moderate Complexity: 1 Mod PT Treatments $Therapeutic Activity: 8-22 mins        Maggie Font, PT Acute Rehab Services Pager [phone] [facility] [phone]   Karlton Lemon 10/12/2019, 2:08 PM

## 2019-10-12 NOTE — Progress Notes (Signed)
Due to pt heart rate of 160, gave 10am meds early.  Dr Marylyn Ishihara made aware of the hr and bp of 135/71.  Pt is very anxious and was also given a clonapam for her 'NERVES'.

## 2019-10-12 NOTE — Discharge Instructions (Signed)

## 2019-10-12 NOTE — Social Work (Signed)
CMS LIST PROVIDED  1. [place] [phone] Quality rating Patient survey rating  2. [place] [phone] Quality rating Patient survey rating  3. [place] [phone] Quality rating Patient survey rating  4. [facility] [phone] Quality rating Patient survey rating  5. [place] [phone] Quality rating Patient survey rating  6. [place] [phone] Quality rating Patient survey rating  7. [facility] [phone] Quality rating Patient survey rating  8. [facility] [phone] Quality rating Patient survey rating  9. [place] [phone] Quality rating Patient survey rating  10. [facility] [phone] Quality rating Not available4 Patient survey rating Not available12  11. [facility] [phone] Quality rating Patient survey rating  12. [place] [phone] Quality rating Patient survey rating  13. [facility] [phone] Quality rating Patient survey rating  Data last updated: April 22, 2019 To explore and download home health agency data,visit the data catalog on CMS.gov Finding a home health agency in your area Home health agencies provide care in your home, regardless of where their office is located. This list shows the home health agencies that serve your area, based on the

## 2019-10-12 NOTE — Progress Notes (Signed)
Central tele called about HR 164. Entered patient room and she stated she did not have her morning meds and is feeling very anxious. She states she feels like her heart is beating fast again. Next shift RN made aware. Nurse paged doctor and entered room to further assess the patient.

## 2019-10-13 ENCOUNTER — Telehealth: Payer: Self-pay | Admitting: *Deleted

## 2019-10-13 ENCOUNTER — Telehealth: Payer: Self-pay | Admitting: Cardiology

## 2019-10-13 NOTE — Telephone Encounter (Signed)
Attempted to call patient - mailbox is full and unable to leave message.  Will try again later. 

## 2019-10-13 NOTE — Telephone Encounter (Signed)
I left a message for the patient to return my call.

## 2019-10-13 NOTE — Telephone Encounter (Signed)
New Message:     Pt need to know know when is she supposed to have her next appointment? She was just discharged from [place] yesterday, but she already had a 6 weeks return visit with Lurena Joiner for 11-19-19 Does she need a sooner appt?

## 2019-10-14 ENCOUNTER — Telehealth: Payer: Self-pay | Admitting: *Deleted

## 2019-10-14 NOTE — Telephone Encounter (Signed)
Called patient on 10/14/2019 , 12:16 PM in an attempt to reach the patient for a hospital follow up.   Admit date: 10/10/19 Discharge: 10/12/19   She does not have any questions or concerns about medications from the hospital admission. The patient's medications were reviewed over the phone, they were counseled to bring in all current medications to the hospital follow up visit.   I advised the patient to call if any questions or concerns arise about the hospital admission or medications    Home health was not started in the hospital.  All questions were answered and a follow up appointment was made. Patient states she wants to keep her appointment on 10/15/2019 with Garnet Sierras,  due to recent episode of increased heart rate this morning, that subsided.  Prior to Admission medications   Medication Sig Start Date End Date Taking? Authorizing Provider  acetaminophen (TYLENOL) 500 MG tablet Take 500-1,000 mg by mouth See admin instructions. Take 1 tablet (500 mg) by mouth every morning, take 2 tablets (1000 mg) with lunch and 1 tablet (500 mg) at night    [provider]  apixaban (ELIQUIS) 5 MG TABS tablet Take 1 tablet 2 x /day to Prevent Blood Clots Patient taking differently: Take 2.5 mg by mouth 2 (two) times daily.  04/14/19   Unk Pinto, MD  atenolol (TENORMIN) 100 MG tablet Take 0.5 tablets (50 mg total) by mouth daily. 10/09/19   Erlene Quan, PA-C  Cholecalciferol (VITAMIN D-3) 125 MCG (5000 UT) TABS Take 5,000 Units by mouth daily.    [provider]  clonazePAM (KLONOPIN) 1 MG tablet Take 0.5 tablets (0.5 mg total) by mouth 2 (two) times daily. 10/12/19   Cherylann Ratel A, DO  conjugated estrogens (PREMARIN) vaginal cream Place 1 Applicatorful vaginally See admin instructions. Apply one applicatorful vaginally twice weekly - every Wednesday and Saturday night    [provider]  diclofenac Sodium (VOLTAREN) 1 % GEL Apply 2 grams 3 to 4 x / day as needed  for Pain Patient taking differently: Apply 2 g topically 2 (two) times daily as needed (pain).  06/30/19   Unk Pinto, MD  dicyclomine (BENTYL) 20 MG tablet Take 1 tablet 4 x /day before Meals & Bedtime if needed for Nausea, Cramping, Bloating or Diarrhea Patient taking differently: Take 20 mg by mouth at bedtime. for Nausea, Cramping, Bloating or Diarrhea 06/15/19   Unk Pinto, MD  gabapentin (NEURONTIN) 600 MG tablet Take 0.5 tablets (300 mg total) by mouth 3 (three) times daily. For facial pain 10/12/19   Cherylann Ratel A, DO  lovastatin (MEVACOR) 20 MG tablet Take 1 tablet at Bedtime for Cholesterol Patient taking differently: Take 20 mg by mouth at bedtime.  03/17/19   Unk Pinto, MD  olmesartan (BENICAR) 40 MG tablet Take 1 tablet Daily for BP Patient taking differently: Take 20 mg by mouth at bedtime. For BP 09/15/19   Unk Pinto, MD  omeprazole (PRILOSEC) 40 MG capsule Take 1 capsule Daily for Indigestion & Heartburn Patient taking differently: Take 40 mg by mouth daily before breakfast. for Indigestion & Heartburn 08/15/19   Unk Pinto, MD  polyvinyl alcohol (ARTIFICIAL TEARS) 1.4 % ophthalmic solution Place 1 drop into both eyes 2 (two) times daily as needed for dry eyes.    [provider]

## 2019-10-14 NOTE — Progress Notes (Signed)
Hospital follow up  Assessment and Plan: Hospital visit follow up for Syncope  Debra White was seen today for hospitalization follow-up.  Diagnoses and all orders for this visit:  Essential hypertension Monitor blood pressure at home; call if consistently over 130/80 Continue DASH diet.   Reminder to go to the ER if any CP, SOB, nausea, dizziness, severe HA, changes vision/speech, left arm numbness and tingling and jaw pain.  Mixed hyperlipidemia Discussed dietary and exercise modifications Low fat diet  Aortic atherosclerosis (HCC) Tortuous aorta (HCC) Discussed dietary and exercise modifications  Gastroesophageal reflux disease, unspecified whether esophagitis present Doing well at this time Continue:  Diet discussed Monitor for triggers Avoid food with high acid content Avoid excessive cafeine Increase water intake  Vitamin D deficiency Continue supplementation Taking Vitamin D 5,000 IU daily  Atrial fibrillation with controlled ventricular response (HCC) Continue medications, rate controlled, follow up cardio  Syncope, unspecified syncope type No further episodes Continue to monitor  Chronic hyponatremia Continue to monitor Will check labs next week   All medications were reviewed with patient and family and fully reconciled. All questions answered fully, and patient and family members were encouraged to call the office with any further questions or concerns. Discussed goal to avoid readmission related to this diagnosis. The following medications have been discontinued: Furosemide 40mg  Mirtazapine 15mg  Changed Medications: Atenolol 50mg  daily....was taking 25mg  in hospital.   Over 40 minutes of face to face interview, exam, counseling, chart review, and complex, high/moderate level critical decision making was performed this visit.   Future Appointments  Date Time Provider [place]  11/19/2019 10:45 AM Ilean China [facility]  12/15/2019   9:00 AM Unk Pinto, MD [facility] None  06/23/2020 10:30 AM Garnet Sierras, NP [facility] None     HPI 86 y.o.female presents for follow up for transition from recent hospitalization or SNIF stay. Admit date to the hospital was 10/10/19, patient was discharged from the hospital on 10/12/19 and our clinical staff contacted the office the day after discharge to set up a follow up appointment. The discharge summary, medications, and diagnostic test results were reviewed before meeting with the patient. The patient was admitted for: Syncope.   Home health is involved.  3 in 1 for home use.  She has a rolling walker to help with stability.  She reports that HHPT  Images while in the hospital: DG Chest 2 View  Result Date: 10/10/2019 CLINICAL DATA:  Syncope. EXAM: CHEST - 2 VIEW COMPARISON:  June 05, 2019. FINDINGS: Stable cardiomegaly. No pneumothorax or pleural effusion is noted. Both lungs are clear. The visualized skeletal structures are unremarkable. IMPRESSION: No active cardiopulmonary disease. Electronically Signed   By: Marijo Conception M.D.   On: 10/10/2019 15:29   DG Abd 1 View  Result Date: 10/11/2019 CLINICAL DATA:  Intractable nausea and vomiting EXAM: ABDOMEN - 1 VIEW COMPARISON:  Noncontrast CT 06/05/2019 FINDINGS: Normal bowel gas pattern. No bowel dilatation to suggest obstruction. Small volume of stool in the colon. No radiopaque calculi or abnormal soft tissue calcifications. No free air on portable supine view. Postsurgical change of the lower lumbar spine. No acute osseous abnormalities are seen. IMPRESSION: Normal bowel gas pattern. No explanation for nausea and vomiting on portable AP radiograph. Electronically Signed   By: Keith Rake M.D.   On: 10/11/2019 16:07   ECHOCARDIOGRAM COMPLETE  Result Date: 10/11/2019    ECHOCARDIOGRAM REPORT   Patient Name:   Debra White Date of Exam: 10/11/2019 Medical  Rec #:  [id number]      Height:       64.0 in Accession #:     [id number]     Weight:       93.9 lb Date of Birth:  08-24-1933       BSA:          1.419 m Patient Age:    86 years       BP:           148/72 mmHg Patient Gender: F              HR:           66 bpm. Exam Location:  Inpatient Procedure: 2D Echo, Cardiac Doppler and Color Doppler Indications:    R55 Syncope  History:        Patient has prior history of Echocardiogram examinations, most                 recent 03/23/2018.  Sonographer:    Merrie Roof RDCS Referring Phys: [id number] [place]  1. Left ventricular ejection fraction, by estimation, is 55 to 60%. The left ventricle has normal function. The left ventricle has no regional wall motion abnormalities. Left ventricular diastolic function could not be evaluated.  2. Right ventricular systolic function is normal. The right ventricular size is normal. There is normal pulmonary artery systolic pressure. The estimated right ventricular systolic pressure is 123XX123 mmHg.  3. Left atrial size was severely dilated.  4. Right atrial size was severely dilated.  5. The mitral valve is grossly normal. Mild mitral valve regurgitation. No evidence of mitral stenosis.  6. Tricuspid valve regurgitation is moderate.  7. The aortic valve is tricuspid. Aortic valve regurgitation is not visualized. Mild aortic valve sclerosis is present, with no evidence of aortic valve stenosis.  8. The inferior vena cava is normal in size with greater than 50% respiratory variability, suggesting right atrial pressure of 3 mmHg. Comparison(s): No significant change from prior study. FINDINGS  Left Ventricle: Left ventricular ejection fraction, by estimation, is 55 to 60%. The left ventricle has normal function. The left ventricle has no regional wall motion abnormalities. The left ventricular internal cavity size was normal in size. There is  no left ventricular hypertrophy. Left ventricular diastolic function could not be evaluated due to atrial fibrillation. Left ventricular diastolic  function could not be evaluated. Right Ventricle: The right ventricular size is normal. No increase in right ventricular wall thickness. Right ventricular systolic function is normal. There is normal pulmonary artery systolic pressure. The tricuspid regurgitant velocity is 2.61 m/s, and  with an assumed right atrial pressure of 3 mmHg, the estimated right ventricular systolic pressure is 123XX123 mmHg. Left Atrium: Left atrial size was severely dilated. Right Atrium: Right atrial size was severely dilated. Pericardium: A small pericardial effusion is present. The pericardial effusion is circumferential. Mitral Valve: The mitral valve is grossly normal. Mild mitral valve regurgitation. No evidence of mitral valve stenosis. Tricuspid Valve: The tricuspid valve is grossly normal. Tricuspid valve regurgitation is moderate. Aortic Valve: The aortic valve is tricuspid. Aortic valve regurgitation is not visualized. Mild aortic valve sclerosis is present, with no evidence of aortic valve stenosis. Pulmonic Valve: The pulmonic valve was grossly normal. Pulmonic valve regurgitation is mild. No evidence of pulmonic stenosis. Aorta: The aortic root and ascending aorta are structurally normal, with no evidence of dilitation. Venous: The inferior vena cava is normal in size with greater than 50% respiratory variability,  suggesting right atrial pressure of 3 mmHg. IAS/Shunts: The atrial septum is grossly normal.  LEFT VENTRICLE PLAX 2D LVOT diam:     1.90 cm LV SV:         51 LV SV Index:   36 LVOT Area:     2.84 cm  LV Volumes (MOD) LV vol d, MOD A4C: 58.6 ml LV vol s, MOD A4C: 23.7 ml LV SV MOD A4C:     58.6 ml RIGHT VENTRICLE            IVC RV Basal diam:  3.50 cm    IVC diam: 2.10 cm RV S prime:     9.36 cm/s TAPSE (M-mode): 2.1 cm LEFT ATRIUM             Index       RIGHT ATRIUM           Index LA Vol (A2C):   84.1 ml 59.28 ml/m RA Area:     24.60 cm LA Vol (A4C):   74.2 ml 52.30 ml/m RA Volume:   76.10 ml  53.64 ml/m LA  Biplane Vol: 82.5 ml 58.15 ml/m  AORTIC VALVE LVOT Vmax:   82.50 cm/s LVOT Vmean:  51.500 cm/s LVOT VTI:    0.181 m  AORTA Ao Asc diam: 3.40 cm TRICUSPID VALVE TR Peak grad:   27.2 mmHg TR Vmax:        261.00 cm/s  SHUNTS Systemic VTI:  0.18 m Systemic Diam: 1.90 cm Eleonore Chiquito MD Electronically signed by Eleonore Chiquito MD Signature Date/Time: 10/11/2019/1:38:25 PM    Final       Current Outpatient Medications (Cardiovascular):  .  atenolol (TENORMIN) 100 MG tablet, Take 0.5 tablets (50 mg total) by mouth daily. Marland Kitchen  lovastatin (MEVACOR) 20 MG tablet, Take 1 tablet at Bedtime for Cholesterol (Patient taking differently: Take 20 mg by mouth at bedtime. ) .  olmesartan (BENICAR) 40 MG tablet, Take 1 tablet Daily for BP (Patient taking differently: Take 20 mg by mouth at bedtime. For BP)   Current Outpatient Medications (Analgesics):  .  acetaminophen (TYLENOL) 500 MG tablet, Take 500-1,000 mg by mouth See admin instructions. Take 1 tablet (500 mg) by mouth every morning, take 2 tablets (1000 mg) with lunch and 1 tablet (500 mg) at night  Current Outpatient Medications (Hematological):  .  apixaban (ELIQUIS) 5 MG TABS tablet, Take 1 tablet 2 x /day to Prevent Blood Clots (Patient taking differently: Take 2.5 mg by mouth 2 (two) times daily. )  Current Outpatient Medications (Other):  Marland Kitchen  Cholecalciferol (VITAMIN D-3) 125 MCG (5000 UT) TABS, Take 5,000 Units by mouth daily. .  clonazePAM (KLONOPIN) 1 MG tablet, Take 0.5 tablets (0.5 mg total) by mouth 2 (two) times daily. Marland Kitchen  conjugated estrogens (PREMARIN) vaginal cream, Place 1 Applicatorful vaginally See admin instructions. Apply one applicatorful vaginally twice weekly - every Wednesday and Saturday night .  diclofenac Sodium (VOLTAREN) 1 % GEL, Apply 2 grams 3 to 4 x / day as needed for Pain (Patient taking differently: Apply 2 g topically 2 (two) times daily as needed (pain). ) .  dicyclomine (BENTYL) 20 MG tablet, Take 1 tablet 4 x /day before  Meals & Bedtime if needed for Nausea, Cramping, Bloating or Diarrhea (Patient taking differently: Take 20 mg by mouth at bedtime. for Nausea, Cramping, Bloating or Diarrhea) .  gabapentin (NEURONTIN) 600 MG tablet, Take 0.5 tablets (300 mg total) by mouth 3 (three) times daily. For facial pain .  omeprazole (PRILOSEC) 40 MG capsule, Take 1 capsule Daily for Indigestion & Heartburn (Patient taking differently: Take 40 mg by mouth daily before breakfast. for Indigestion & Heartburn) .  polyvinyl alcohol (ARTIFICIAL TEARS) 1.4 % ophthalmic solution, Place 1 drop into both eyes 2 (two) times daily as needed for dry eyes.  Past Medical History:  Diagnosis Date  . Acute on chronic combined systolic and diastolic CHF (congestive heart failure) ([place]) 06/29/2019  . Anxiety   . Arthritis   . Blood transfusion   . C. difficile colitis   . Candida esophagitis ([place]) 2013   EGD  . Cataract   . Chronic kidney disease    STONES  . Diverticulitis 04/05/2017  . Diverticulosis of colon (without mention of hemorrhage) 2007   Colonoscopy  . Dysrhythmia    RX  . Emphysema of lung ([place])   . Family history of colon cancer    sister  . Fibromyalgia   . Fracture, zygoma closed ([place]) 07/13/2011  . GERD (gastroesophageal reflux disease)   . Headache(784.0)   . Hyperlipemia   . Hypertension   . Kidney stones   . Ptosis, bilateral   . 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 Splenic infarct      Allergies  Allergen Reactions  . Latex Hives    TONGUE HAD BLISTERS WHEN HAD DENTAL SURGERY  . Amoxicillin Nausea And Vomiting  . Aspirin Other (See Comments)    Gi upset  . Barbiturates Other (See Comments)    Unknown reaction  . Celebrex [Celecoxib] Other (See Comments)    Unknown reaction  . Codeine Itching  . Evista [Raloxifene] Other (See Comments)    Unknown reaction  . Lipitor [Atorvastatin] Other (See Comments)    Unknown reaction  . Morphine And Related Other (See Comments)    Unknown reaction  . Oruvail  [Ketoprofen] Other (See Comments)    Unknown reaction  . Pantoprazole Diarrhea  . Paraffin Other (See Comments)    Unknown reaction  . Prochlorperazine Other (See Comments)    Uncontrolled shaking  . Proloprim [Trimethoprim] Other (See Comments)    Unknown reaction  . Venlafaxine Nausea Only  . Vibra-Tab [Doxycycline] Other (See Comments)    Unknown reaction  . Vioxx [Rofecoxib] Other (See Comments)    edema  . Betadine [Povidone Iodine] Rash  . Caffeine Palpitations    ROS: all negative except above.   Physical Exam: Filed Weights   10/15/19 1113  Weight: 97 lb 3.2 oz (44.1 kg)   BP 136/82   Pulse 60   Temp (!) 96.8 F (36 C)   Wt 97 lb 3.2 oz (44.1 kg)   SpO2 97%   BMI 16.68 kg/m  General Appearance: Thin frail, in no apparent distress. Eyes: PERRLA, EOMs, conjunctiva no swelling or erythema Sinuses: No Frontal/maxillary tenderness ENT/Mouth: Ext aud canals clear, TMs without erythema, bulging. No erythema, swelling, or exudate on post pharynx.  Tonsils not swollen or erythematous. Hearing normal.  Neck: Supple, thyroid normal.  Respiratory: Respiratory effort normal, BS equal bilaterally without rales, rhonchi, wheezing or stridor.  Cardio: RRR with no MRGs. Brisk peripheral pulses without edema.  Abdomen: Soft, + BS.  Non tender, no guarding, rebound, hernias, masses. Lymphatics: Non tender without lymphadenopathy.  Musculoskeletal: Full ROM, 5/5 strength, normal gait.  Skin: Warm, dry without rashes, lesions, ecchymosis.  Neuro: Cranial nerves intact. Normal muscle tone, no cerebellar symptoms. Sensation intact.  Psych: Awake and oriented X 3, normal affect, Insight and Judgment appropriate.  Garnet Sierras, NP 11:48 AM  [facility]

## 2019-10-15 ENCOUNTER — Other Ambulatory Visit: Payer: Self-pay

## 2019-10-15 ENCOUNTER — Ambulatory Visit (INDEPENDENT_AMBULATORY_CARE_PROVIDER_SITE_OTHER): Payer: PPO | Admitting: Adult Health Nurse Practitioner

## 2019-10-15 ENCOUNTER — Telehealth: Payer: Self-pay

## 2019-10-15 VITALS — BP 136/82 | HR 60 | Temp 96.8°F | Wt 97.2 lb

## 2019-10-15 DIAGNOSIS — B3781 Candidal esophagitis: Secondary | ICD-10-CM | POA: Diagnosis not present

## 2019-10-15 DIAGNOSIS — R55 Syncope and collapse: Secondary | ICD-10-CM

## 2019-10-15 DIAGNOSIS — N280 Ischemia and infarction of kidney: Secondary | ICD-10-CM | POA: Diagnosis not present

## 2019-10-15 DIAGNOSIS — I130 Hypertensive heart and chronic kidney disease with heart failure and stage 1 through stage 4 chronic kidney disease, or unspecified chronic kidney disease: Secondary | ICD-10-CM | POA: Diagnosis not present

## 2019-10-15 DIAGNOSIS — E871 Hypo-osmolality and hyponatremia: Secondary | ICD-10-CM

## 2019-10-15 DIAGNOSIS — I5043 Acute on chronic combined systolic (congestive) and diastolic (congestive) heart failure: Secondary | ICD-10-CM | POA: Diagnosis not present

## 2019-10-15 DIAGNOSIS — I4891 Unspecified atrial fibrillation: Secondary | ICD-10-CM | POA: Diagnosis not present

## 2019-10-15 DIAGNOSIS — I771 Stricture of artery: Secondary | ICD-10-CM

## 2019-10-15 DIAGNOSIS — E785 Hyperlipidemia, unspecified: Secondary | ICD-10-CM | POA: Diagnosis not present

## 2019-10-15 DIAGNOSIS — Z8673 Personal history of transient ischemic attack (TIA), and cerebral infarction without residual deficits: Secondary | ICD-10-CM | POA: Diagnosis not present

## 2019-10-15 DIAGNOSIS — N189 Chronic kidney disease, unspecified: Secondary | ICD-10-CM | POA: Diagnosis not present

## 2019-10-15 DIAGNOSIS — K219 Gastro-esophageal reflux disease without esophagitis: Secondary | ICD-10-CM | POA: Diagnosis not present

## 2019-10-15 DIAGNOSIS — I700 Atherosclerosis of aorta: Secondary | ICD-10-CM | POA: Diagnosis not present

## 2019-10-15 DIAGNOSIS — Z9181 History of falling: Secondary | ICD-10-CM | POA: Diagnosis not present

## 2019-10-15 DIAGNOSIS — I088 Other rheumatic multiple valve diseases: Secondary | ICD-10-CM | POA: Diagnosis not present

## 2019-10-15 DIAGNOSIS — E559 Vitamin D deficiency, unspecified: Secondary | ICD-10-CM | POA: Diagnosis not present

## 2019-10-15 DIAGNOSIS — E782 Mixed hyperlipidemia: Secondary | ICD-10-CM

## 2019-10-15 DIAGNOSIS — F419 Anxiety disorder, unspecified: Secondary | ICD-10-CM | POA: Diagnosis not present

## 2019-10-15 DIAGNOSIS — M159 Polyosteoarthritis, unspecified: Secondary | ICD-10-CM | POA: Diagnosis not present

## 2019-10-15 DIAGNOSIS — D735 Infarction of spleen: Secondary | ICD-10-CM | POA: Diagnosis not present

## 2019-10-15 DIAGNOSIS — J439 Emphysema, unspecified: Secondary | ICD-10-CM | POA: Diagnosis not present

## 2019-10-15 DIAGNOSIS — M797 Fibromyalgia: Secondary | ICD-10-CM | POA: Diagnosis not present

## 2019-10-15 DIAGNOSIS — I10 Essential (primary) hypertension: Secondary | ICD-10-CM

## 2019-10-15 DIAGNOSIS — Z7901 Long term (current) use of anticoagulants: Secondary | ICD-10-CM | POA: Diagnosis not present

## 2019-10-15 DIAGNOSIS — I0981 Rheumatic heart failure: Secondary | ICD-10-CM | POA: Diagnosis not present

## 2019-10-15 DIAGNOSIS — K5730 Diverticulosis of large intestine without perforation or abscess without bleeding: Secondary | ICD-10-CM | POA: Diagnosis not present

## 2019-10-15 NOTE — Patient Instructions (Addendum)
   You can take Dicyclomine (Bentyl) 20mg  before your meals.  This will help with gas, bloating, nausea. You can still take this at bedtime also.  Continue to drink the Ensure between meals.  It is ok if you don't eat large meals, just eat more smaller meals.    Follow up next week for labs  Contin use to use the walker until physical therapy clears you.

## 2019-10-15 NOTE — Telephone Encounter (Signed)
Patient's daughter, Arbie Cookey, called inquiring about today's visit. Has specific questions about Home Health care and what was discussed at the visit and why she needs to come back for lab work.  Would really appreciate a call back from you. She can't get any straight answers from her mother.

## 2019-10-15 NOTE — Telephone Encounter (Signed)
[id number] 4/22

## 2019-10-16 NOTE — Telephone Encounter (Signed)
Daughter aware.

## 2019-10-22 ENCOUNTER — Other Ambulatory Visit: Payer: Self-pay | Admitting: Adult Health Nurse Practitioner

## 2019-10-22 ENCOUNTER — Other Ambulatory Visit: Payer: PPO

## 2019-10-22 ENCOUNTER — Other Ambulatory Visit: Payer: Self-pay

## 2019-10-22 DIAGNOSIS — I10 Essential (primary) hypertension: Secondary | ICD-10-CM | POA: Diagnosis not present

## 2019-10-22 LAB — COMPLETE METABOLIC PANEL WITH GFR
AG Ratio: 2.4 (calc) (ref 1.0–2.5)
ALT: 11 U/L (ref 6–29)
AST: 18 U/L (ref 10–35)
Albumin: 4.6 g/dL (ref 3.6–5.1)
Alkaline phosphatase (APISO): 96 U/L (ref 37–153)
BUN: 14 mg/dL (ref 7–25)
CO2: 30 mmol/L (ref 20–32)
Calcium: 9 mg/dL (ref 8.6–10.4)
Chloride: 92 mmol/L — ABNORMAL LOW (ref 98–110)
Creat: 0.78 mg/dL (ref 0.60–0.88)
GFR, Est African American: 81 mL/min/{1.73_m2} (ref >=60)
GFR, Est Non African American: 70 mL/min/{1.73_m2} (ref >=60)
Globulin: 1.9 g/dL (calc) (ref 1.9–3.7)
Glucose, Bld: 89 mg/dL (ref 65–99)
Potassium: 5.1 mmol/L (ref 3.5–5.3)
Sodium: 127 mmol/L — ABNORMAL LOW (ref 135–146)
Total Bilirubin: 0.5 mg/dL (ref 0.2–1.2)
Total Protein: 6.5 g/dL (ref 6.1–8.1)

## 2019-10-22 LAB — CBC WITH DIFFERENTIAL/PLATELET
Absolute Monocytes: 694 cells/uL (ref 200–950)
Basophils Absolute: 48 cells/uL (ref 0–200)
Basophils Relative: 0.7 %
Eosinophils Absolute: 27 cells/uL (ref 15–500)
Eosinophils Relative: 0.4 %
HCT: 32.1 % — ABNORMAL LOW (ref 35.0–45.0)
Hemoglobin: 10.7 g/dL — ABNORMAL LOW (ref 11.7–15.5)
Lymphs Abs: 1605 cells/uL (ref 850–3900)
MCH: 29.6 pg (ref 27.0–33.0)
MCHC: 33.3 g/dL (ref 32.0–36.0)
MCV: 88.9 fL (ref 80.0–100.0)
MPV: 10.8 fL (ref 7.5–12.5)
Monocytes Relative: 10.2 %
Neutro Abs: 4427 cells/uL (ref 1500–7800)
Neutrophils Relative %: 65.1 %
Platelets: 224 10*3/uL (ref 140–400)
RBC: 3.61 10*6/uL — ABNORMAL LOW (ref 3.80–5.10)
RDW: 13.2 % (ref 11.0–15.0)
Total Lymphocyte: 23.6 %
WBC: 6.8 10*3/uL (ref 3.8–10.8)

## 2019-10-26 ENCOUNTER — Encounter: Payer: Self-pay | Admitting: Adult Health Nurse Practitioner

## 2019-10-28 DIAGNOSIS — Z7901 Long term (current) use of anticoagulants: Secondary | ICD-10-CM | POA: Diagnosis not present

## 2019-10-28 DIAGNOSIS — J439 Emphysema, unspecified: Secondary | ICD-10-CM | POA: Diagnosis not present

## 2019-10-28 DIAGNOSIS — E559 Vitamin D deficiency, unspecified: Secondary | ICD-10-CM | POA: Diagnosis not present

## 2019-10-28 DIAGNOSIS — N280 Ischemia and infarction of kidney: Secondary | ICD-10-CM | POA: Diagnosis not present

## 2019-10-28 DIAGNOSIS — E871 Hypo-osmolality and hyponatremia: Secondary | ICD-10-CM | POA: Diagnosis not present

## 2019-10-28 DIAGNOSIS — Z8673 Personal history of transient ischemic attack (TIA), and cerebral infarction without residual deficits: Secondary | ICD-10-CM | POA: Diagnosis not present

## 2019-10-28 DIAGNOSIS — F419 Anxiety disorder, unspecified: Secondary | ICD-10-CM | POA: Diagnosis not present

## 2019-10-28 DIAGNOSIS — I0981 Rheumatic heart failure: Secondary | ICD-10-CM | POA: Diagnosis not present

## 2019-10-28 DIAGNOSIS — K219 Gastro-esophageal reflux disease without esophagitis: Secondary | ICD-10-CM | POA: Diagnosis not present

## 2019-10-28 DIAGNOSIS — Z87440 Personal history of urinary (tract) infections: Secondary | ICD-10-CM | POA: Diagnosis not present

## 2019-10-28 DIAGNOSIS — I088 Other rheumatic multiple valve diseases: Secondary | ICD-10-CM | POA: Diagnosis not present

## 2019-10-28 DIAGNOSIS — M797 Fibromyalgia: Secondary | ICD-10-CM | POA: Diagnosis not present

## 2019-10-28 DIAGNOSIS — K5730 Diverticulosis of large intestine without perforation or abscess without bleeding: Secondary | ICD-10-CM | POA: Diagnosis not present

## 2019-10-28 DIAGNOSIS — N952 Postmenopausal atrophic vaginitis: Secondary | ICD-10-CM | POA: Diagnosis not present

## 2019-10-28 DIAGNOSIS — N189 Chronic kidney disease, unspecified: Secondary | ICD-10-CM | POA: Diagnosis not present

## 2019-10-28 DIAGNOSIS — B3781 Candidal esophagitis: Secondary | ICD-10-CM | POA: Diagnosis not present

## 2019-10-28 DIAGNOSIS — I4891 Unspecified atrial fibrillation: Secondary | ICD-10-CM | POA: Diagnosis not present

## 2019-10-28 DIAGNOSIS — E785 Hyperlipidemia, unspecified: Secondary | ICD-10-CM | POA: Diagnosis not present

## 2019-10-28 DIAGNOSIS — Z9181 History of falling: Secondary | ICD-10-CM | POA: Diagnosis not present

## 2019-10-28 DIAGNOSIS — I130 Hypertensive heart and chronic kidney disease with heart failure and stage 1 through stage 4 chronic kidney disease, or unspecified chronic kidney disease: Secondary | ICD-10-CM | POA: Diagnosis not present

## 2019-10-28 DIAGNOSIS — M159 Polyosteoarthritis, unspecified: Secondary | ICD-10-CM | POA: Diagnosis not present

## 2019-10-28 DIAGNOSIS — I5043 Acute on chronic combined systolic (congestive) and diastolic (congestive) heart failure: Secondary | ICD-10-CM | POA: Diagnosis not present

## 2019-10-28 DIAGNOSIS — D735 Infarction of spleen: Secondary | ICD-10-CM | POA: Diagnosis not present

## 2019-10-30 ENCOUNTER — Other Ambulatory Visit: Payer: Self-pay | Admitting: Internal Medicine

## 2019-11-02 ENCOUNTER — Encounter: Payer: Self-pay | Admitting: Physician Assistant

## 2019-11-02 ENCOUNTER — Ambulatory Visit (INDEPENDENT_AMBULATORY_CARE_PROVIDER_SITE_OTHER): Payer: PPO | Admitting: Physician Assistant

## 2019-11-02 ENCOUNTER — Other Ambulatory Visit: Payer: Self-pay

## 2019-11-02 VITALS — BP 118/66 | HR 62 | Temp 97.7°F | Wt 98.6 lb

## 2019-11-02 DIAGNOSIS — R531 Weakness: Secondary | ICD-10-CM

## 2019-11-02 DIAGNOSIS — R002 Palpitations: Secondary | ICD-10-CM

## 2019-11-02 DIAGNOSIS — R350 Frequency of micturition: Secondary | ICD-10-CM | POA: Diagnosis not present

## 2019-11-02 DIAGNOSIS — R1013 Epigastric pain: Secondary | ICD-10-CM | POA: Diagnosis not present

## 2019-11-02 NOTE — Patient Instructions (Addendum)
Follow up with cardiology Will check labs  Will get an ultrasound of your gallbladder Will refer you to GI as well if your gallbladder is normal.    Cholelithiasis  Cholelithiasis is a form of gallbladder disease in which gallstones form in the gallbladder. The gallbladder is an organ that stores bile. Bile is made in the liver, and it helps to digest fats. Gallstones begin as small crystals and slowly grow into stones. They may cause no symptoms until the gallbladder tightens (contracts) and a gallstone is blocking the duct (gallbladder attack), which can cause pain. Cholelithiasis is also referred to as gallstones. There are two main types of gallstones:  Cholesterol stones. These are made of hardened cholesterol and are usually yellow-green in color. They are the most common type of gallstone. Cholesterol is a white, waxy, fat-like substance that is made in the liver.  Pigment stones. These are dark in color and are made of a red-yellow substance that forms when hemoglobin from red blood cells breaks down (bilirubin). What are the causes? This condition may be caused by an imbalance in the substances that bile is made of. This can happen if the bile:  Has too much bilirubin.  Has too much cholesterol.  Does not have enough bile salts. These salts help the body absorb and digest fats. In some cases, this condition can also be caused by the gallbladder not emptying completely or often enough. What increases the risk? The following factors may make you more likely to develop this condition:  Being female.  Having multiple pregnancies. Health care providers sometimes advise removing diseased gallbladders before future pregnancies.    Eating a diet that is heavy in fried foods, fat, and refined carbohydrates, like white bread and white rice.  Being obese.  Being older than age 26.  Prolonged use of medicines that contain female hormones (estrogen).  Having diabetes  mellitus.  Rapidly losing weight.  Having a family history of gallstones.  Being of [place] or [place] descent.  Having an intestinal disease such as Crohn disease.  Having metabolic syndrome.  Having cirrhosis.  Having severe types of anemia such as sickle cell anemia. What are the signs or symptoms? In most cases, there are no symptoms. These are known as silent gallstones. If a gallstone blocks the bile ducts, it can cause a gallbladder attack. The main symptom of a gallbladder attack is sudden pain in the upper right abdomen. The pain usually comes at night or after eating a large meal. The pain can last for one or several hours and can spread to the right shoulder or chest. If the bile duct is blocked for more than a few hours, it can cause infection or inflammation of the gallbladder, liver, or pancreas, which may cause:  Nausea.  Vomiting.  Abdominal pain that lasts for 5 hours or more.  Fever or chills.  Yellowing of the skin or the whites of the eyes (jaundice).  Dark urine.  Light-colored stools. How is this diagnosed? This condition may be diagnosed based on:  A physical exam.  Your medical history.  An ultrasound of your gallbladder.  CT scan.  MRI.  Blood tests to check for signs of infection or inflammation.  A scan of your gallbladder and bile ducts (biliary system) using nonharmful radioactive material and special cameras that can see the radioactive material (cholescintigram). This test checks to see how your gallbladder contracts and whether bile ducts are blocked.  Inserting a small tube with a camera  on the end (endoscope) through your mouth to inspect bile ducts and check for blockages (endoscopic retrograde cholangiopancreatogram). How is this treated? Treatment for gallstones depends on the severity of the condition. Silent gallstones do not need treatment. If the gallstones cause a gallbladder attack or other symptoms, treatment may  be required. Options for treatment include:  Surgery to remove the gallbladder (cholecystectomy). This is the most common treatment.  Medicines to dissolve gallstones. These are most effective at treating small gallstones. You may need to take medicines for up to 6-12 months.  Shock wave treatment (extracorporeal biliary lithotripsy). In this treatment, an ultrasound machine sends shock waves to the gallbladder to break gallstones into smaller pieces. These pieces can then be passed into the intestines or be dissolved by medicine. This is rarely used.  Removing gallstones through endoscopic retrograde cholangiopancreatogram. A small basket can be attached to the endoscope and used to capture and remove gallstones. Follow these instructions at home:  Take over-the-counter and prescription medicines only as told by your health care provider.  Maintain a healthy weight and follow a healthy diet. This includes: ? Reducing fatty foods, such as fried food. ? Reducing refined carbohydrates, like white bread and white rice. ? Increasing fiber. Aim for foods like almonds, fruit, and beans.  Keep all follow-up visits as told by your health care provider. This is important. Contact a health care provider if:  You think you have had a gallbladder attack.  You have been diagnosed with silent gallstones and you develop abdominal pain or indigestion. Get help right away if:  You have pain from a gallbladder attack that lasts for more than 2 hours.  You have abdominal pain that lasts for more than 5 hours.  You have a fever or chills.  You have persistent nausea and vomiting.  You develop jaundice.  You have dark urine or light-colored stools. Summary  Cholelithiasis (also called gallstones) is a form of gallbladder disease in which gallstones form in the gallbladder.  This condition is caused by an imbalance in the substances that make up bile. This can happen if the bile has too much  cholesterol, too much bilirubin, or not enough bile salts.  You are more likely to develop this condition if you are female, pregnant, using medicines with estrogen, obese, older than age 77, or have a family history of gallstones. You may also develop gallstones if you have diabetes, an intestinal disease, cirrhosis, or metabolic syndrome.  Treatment for gallstones depends on the severity of the condition. Silent gallstones do not need treatment.  If gallstones cause a gallbladder attack or other symptoms, treatment may be needed. The most common treatment is surgery to remove the gallbladder. This information is not intended to replace advice given to you by your health care provider. Make sure you discuss any questions you have with your health care provider. Document Revised: 05/24/2017 Document Reviewed: 02/26/2016 Elsevier Patient Education  [place].   Nausea, Adult Nausea is the feeling that you have an upset stomach or that you are about to vomit. Nausea on its own is not usually a serious concern, but it may be an early sign of a more serious medical problem. As nausea gets worse, it can lead to vomiting. If vomiting develops, or if you are not able to drink enough fluids, you are at risk of becoming dehydrated. Dehydration can make you tired and thirsty, cause you to have a dry mouth, and decrease how often you urinate. Older adults  and people with other diseases or a weak disease-fighting system (immune system) are at higher risk for dehydration. The main goals of treating your nausea are:  To relieve your nausea.  To limit repeated nausea episodes.  To prevent vomiting and dehydration. Follow these instructions at home: Watch your symptoms for any changes. Tell your health care provider about them. Follow these instructions as told by your health care provider. Eating and drinking      Take an oral rehydration solution (ORS). This is a drink that is sold at  pharmacies and retail stores.  Drink clear fluids slowly and in small amounts as you are able. Clear fluids include water, ice chips, low-calorie sports drinks, and fruit juice that has water added (diluted fruit juice).  Eat bland, easy-to-digest foods in small amounts as you are able. These foods include bananas, applesauce, rice, lean meats, toast, and crackers.  Avoid drinking fluids that contain a lot of sugar or caffeine, such as energy drinks, sports drinks, and soda.  Avoid alcohol.  Avoid spicy or fatty foods. General instructions  Take over-the-counter and prescription medicines only as told by your health care provider.  Rest at home while you recover.  Drink enough fluid to keep your urine pale yellow.  Breathe slowly and deeply when you feel nauseous.  Avoid smelling things that have strong odors.  Wash your hands often using soap and water. If soap and water are not available, use hand sanitizer.  Make sure that all people in your household wash their hands well and often.  Keep all follow-up visits as told by your health care provider. This is important. Contact a health care provider if:  Your nausea gets worse.  Your nausea does not go away after two days.  You vomit.  You cannot drink fluids without vomiting.  You have any of the following: ? New symptoms. ? A fever. ? A headache. ? Muscle cramps. ? A rash. ? Pain while urinating.  You feel light-headed or dizzy. Get help right away if:  You have pain in your chest, neck, arm, or jaw.  You feel extremely weak or you faint.  You have vomit that is bright red or looks like coffee grounds.  You have bloody or black stools or stools that look like tar.  You have a severe headache, a stiff neck, or both.  You have severe pain, cramping, or bloating in your abdomen.  You have difficulty breathing or are breathing very quickly.  Your heart is beating very quickly.  Your skin feels cold and  clammy.  You feel confused.  You have signs of dehydration, such as: ? Dark urine, very little urine, or no urine. ? Cracked lips. ? Dry mouth. ? Sunken eyes. ? Sleepiness. ? Weakness. These symptoms may represent a serious problem that is an emergency. Do not wait to see if the symptoms will go away. Get medical help right away. Call your local emergency services (911 in the [place] Do not drive yourself to the hospital. Summary  Nausea is the feeling that you have an upset stomach or that you are about to vomit. Nausea on its own is not usually a serious concern, but it may be an early sign of a more serious medical problem.  If vomiting develops, or if you are not able to drink enough fluids, you are at risk of becoming dehydrated.  Follow recommendations for eating and drinking and take over-the-counter and prescription medicines only as told by your health  care provider.  Contact a health care provider right away if your symptoms worsen or you have new symptoms.  Keep all follow-up visits as told by your health care provider. This is important. This information is not intended to replace advice given to you by your health care provider. Make sure you discuss any questions you have with your health care provider. Document Revised: 11/19/2017 Document Reviewed: 11/19/2017 Elsevier Patient Education  [place].

## 2019-11-02 NOTE — Progress Notes (Signed)
Acute visit  Assessment:   Epigastric pain Benign AB- some mild RUQ and epigastric tenderness- neg murphy Had sludge in GB on CT scan for hematuria- will check with AB [place] and labs to rule out cholecystis as part of the issue Check pancreatic enzymes though pancreas looked normal on recent CT AB Continue protonix Continue dicyclomine.  -     Lipase -     Amylase -     Ambulatory referral to Gastroenterology -     US Abdomen Complete; Future  Weakness -     CBC with Differential/Platelet -     COMPLETE METABOLIC PANEL WITH GFR -     Cortisol Continue boost/ensure Check labs Suggest possible loop recorder as suggested by cardiology Rule out infection  Urinary frequency -     Urinalysis, Routine w reflex microscopic -     Urine Culture  Palpitations Follow up with cardiology  The patient was advised to call immediately if she has any concerning symptoms in the interval. The patient voices understanding of current treatment options and is in agreement with the current care plan.The patient knows to call the clinic with any problems, questions or concerns or go to the ER if any further progression of symptoms.     Over 30 minutes of exam, counseling, chart review and critical decision making was performed Future Appointments  Date Time Provider [place]  11/19/2019 10:45 AM Erlene Quan, PA-C [facility]  12/15/2019  9:00 AM Unk Pinto, MD [facility] None  06/23/2020 10:30 AM Garnet Sierras, NP [facility] None    Subjective:  Debra White is a 86 y.o. female who presents for not feeling well, she was seen recently for a wellness visit 04/13 and then seen in the hospital 04/17 for syncope.  She had positive orthostatics, suppose to be wearing compression socks and have someone help her meds and follow up with cardiology, has follow up with Dr. Lurena Joiner.   She states she is still having weakness since her hospitilization visit less than a month  ago, she has epigastric pain, feels like pressure worse in the morning, feels shaky, nausea.  She states sometimes water will get caught in her throat, denies pills or food getting caught.  She has to take the dicyclomine only at night.  She has had a recent CT AB for gross hematuria, was negative.   Normal CT head 05/2019  She had splenic and renal infarcts in 2018, history of CVA and on Eliquis for Afib.  Echo 2021 EF 55-60% with severe LAE.  Declines TEE and loop recorder. Has seen Dr. Oval Linsey.   Husband has dementia, he is doing PT and working on strength.   She continue to have headaches and she states she feels weak.  She was having weight loss but her weight is stable.  She still has a good appetite but she is stressed from having to take care of her husband, they will have been married for 65 years this year.   Last colonoscopy: 2013 Last mammogram: 08/2019 BMI is Body mass index is 16.92 kg/m., she is working on diet and exercise.  Wt Readings from Last 3 Encounters:  11/02/19 98 lb 9.6 oz (44.7 kg)  10/15/19 97 lb 3.2 oz (44.1 kg)  10/12/19 92 lb 3.2 oz (41.8 kg)   Medication Review:   Current Outpatient Medications (Cardiovascular):  .  atenolol (TENORMIN) 100 MG tablet, Take 0.5 tablets (50 mg total) by mouth daily. Marland Kitchen  lovastatin (MEVACOR) 20 MG  tablet, Take 1 tablet at Bedtime for Cholesterol (Patient taking differently: Take 20 mg by mouth at bedtime. ) .  olmesartan (BENICAR) 40 MG tablet, Take 1 tablet Daily for BP (Patient taking differently: Take 20 mg by mouth at bedtime. For BP)   Current Outpatient Medications (Analgesics):  .  acetaminophen (TYLENOL) 500 MG tablet, Take 500-1,000 mg by mouth See admin instructions. Take 1 tablet (500 mg) by mouth every morning, take 2 tablets (1000 mg) with lunch and 1 tablet (500 mg) at night  Current Outpatient Medications (Hematological):  .  apixaban (ELIQUIS) 5 MG TABS tablet, Take 1 tablet 2 x /day to Prevent Blood  Clots (Patient taking differently: Take 2.5 mg by mouth 2 (two) times daily. )  Current Outpatient Medications (Other):  Marland Kitchen  Cholecalciferol (VITAMIN D-3) 125 MCG (5000 UT) TABS, Take 5,000 Units by mouth daily. .  clonazePAM (KLONOPIN) 1 MG tablet, Take 0.5 tablets (0.5 mg total) by mouth 2 (two) times daily. Marland Kitchen  conjugated estrogens (PREMARIN) vaginal cream, Place 1 Applicatorful vaginally See admin instructions. Apply one applicatorful vaginally twice weekly - every Wednesday and Saturday night .  diclofenac Sodium (VOLTAREN) 1 % GEL, Apply 2 grams 3 to 4 x / day as needed for Pain (Patient taking differently: Apply 2 g topically 2 (two) times daily as needed (pain). ) .  dicyclomine (BENTYL) 20 MG tablet, Take  1 tablet  4 x /day at Mealtimes & Bedtime as needed for Cramping, Nausea  or Bloating .  gabapentin (NEURONTIN) 600 MG tablet, Take 0.5 tablets (300 mg total) by mouth 3 (three) times daily. For facial pain .  omeprazole (PRILOSEC) 40 MG capsule, Take 1 capsule Daily for Indigestion & Heartburn (Patient taking differently: Take 40 mg by mouth daily before breakfast. for Indigestion & Heartburn) .  polyvinyl alcohol (ARTIFICIAL TEARS) 1.4 % ophthalmic solution, Place 1 drop into both eyes 2 (two) times daily as needed for dry eyes.   Allergies  Allergen Reactions  . Latex Hives    TONGUE HAD BLISTERS WHEN HAD DENTAL SURGERY  . Amoxicillin Nausea And Vomiting  . Aspirin Other (See Comments)    Gi upset  . Barbiturates Other (See Comments)    Unknown reaction  . Celebrex [Celecoxib] Other (See Comments)    Unknown reaction  . Codeine Itching  . Evista [Raloxifene] Other (See Comments)    Unknown reaction  . Lipitor [Atorvastatin] Other (See Comments)    Unknown reaction  . Morphine And Related Other (See Comments)    Unknown reaction  . Oruvail [Ketoprofen] Other (See Comments)    Unknown reaction  . Pantoprazole Diarrhea  . Paraffin Other (See Comments)    Unknown reaction   . Prochlorperazine Other (See Comments)    Uncontrolled shaking  . Proloprim [Trimethoprim] Other (See Comments)    Unknown reaction  . Venlafaxine Nausea Only  . Vibra-Tab [Doxycycline] Other (See Comments)    Unknown reaction  . Vioxx [Rofecoxib] Other (See Comments)    edema  . Betadine [Povidone Iodine] Rash  . Caffeine Palpitations    Current Problems (verified) Patient Active Problem List   Diagnosis Date Noted  . Syncope 10/10/2019  . Diastolic CHF ([place]) XX123456  . History of embolic stroke XX123456  . Atrial fibrillation with controlled ventricular response ([place]) 03/12/2019  . Coronary atherosclerosis due to lipid rich plaque 03/26/2018  . Renal infarct ([place]) 03/22/2018  . Chronic hyponatremia 03/22/2018  . Chronic hyperkalemia 03/22/2018  . Hepatic steatosis 11/17/2017  . Aortic  atherosclerosis ([place]) 08/08/2017  . 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 Osteoporosis 12/21/2015  . Mild malnutrition ([place]) 05/11/2015  . Encounter for Medicare annual wellness exam 04/08/2015  . Medication management 01/27/2014  . 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 Vitamin D deficiency 07/08/2013  . Hypertension   . Hyperlipidemia   . GERD   . Headache, post-traumatic, chronic 09/20/2011    SURGICAL HISTORY She  has a past surgical history that includes Abdominal hysterectomy; Hand surgery; Shoulder arthroscopy w/ rotator cuff repair; Cervical disc surgery; Back surgery; ORIF tripod fracture (07/13/2011); Colonoscopy; Facial fracture surgery; Back surgery; Ptosis repair (Bilateral, 11/22/2016); and Breast excisional biopsy. FAMILY HISTORY Her family history includes Breast cancer (age of onset: 29) in her sister; Cancer in her sister, sister, and sister; Colon cancer in her sister; Heart attack in her father; Heart disease in her father, mother, and sister; Hyperlipidemia in her daughter; Hypertension in her  daughter. SOCIAL HISTORY She  reports that she has never smoked. She has never used smokeless tobacco. She reports that she does not drink alcohol or use drugs.   Review of Systems  Constitutional: Positive for malaise/fatigue and weight loss. Negative for chills, diaphoresis and fever.  HENT: Negative for congestion, ear discharge, ear pain, hearing loss, nosebleeds, sinus pain, sore throat and tinnitus.   Eyes: Negative for blurred vision, double vision, photophobia, pain, discharge and redness.  Respiratory: Negative for cough, hemoptysis, sputum production, shortness of breath, wheezing and stridor.   Cardiovascular: Negative for chest pain, palpitations, orthopnea, claudication, leg swelling and PND.  Gastrointestinal: Positive for abdominal pain and nausea. Negative for blood in stool, constipation, diarrhea, heartburn, melena and vomiting.  Genitourinary: Negative for dysuria, flank pain, frequency, hematuria and urgency.  Musculoskeletal: Positive for back pain, joint pain and myalgias. Negative for falls and neck pain.  Skin: Negative for itching and rash.  Neurological: Negative for dizziness, tingling, tremors, sensory change, speech change, focal weakness, seizures, loss of consciousness, weakness and headaches.  Endo/Heme/Allergies: Negative for environmental allergies and polydipsia. Does not bruise/bleed easily.  Psychiatric/Behavioral: Negative for depression, hallucinations, memory loss, substance abuse and suicidal ideas. The patient is nervous/anxious. The patient does not have insomnia.      Objective:     Today's Vitals   11/02/19 1219  BP: 118/66  Pulse: 62  Temp: 97.7 F (36.5 C)  SpO2: 100%  Weight: 98 lb 9.6 oz (44.7 kg)   Body mass index is 16.92 kg/m.  General appearance: alert, no distress, WD/WN, female HEENT: normocephalic, sclerae anicteric, TMs pearly right, left cerumen impaction, nares patent, no discharge or erythema, pharynx normal Oral cavity:  MMM, no lesions Neck: supple, no lymphadenopathy, no thyromegaly, no masses Heart: RRR, normal S1, S2, no murmurs Lungs: CTA bilaterally, no wheezes, rhonchi, or rales Abdomen: +bs, soft, diffuse tenderness, non distended, no masses, no hepatomegaly, no splenomegaly Musculoskeletal: nontender, no swelling, no obvious deformity Extremities: no edema, no cyanosis, no clubbing Pulses: 2+ symmetric, upper and lower extremities, normal cap refill Neurological: alert, oriented x 3, CN2-12 intact, strength normal upper extremities and lower extremities, sensation normal throughout, DTRs 2+ throughout, no cerebellar signs, gait normal Psychiatric: normal affect, behavior normal, pleasant    Vicie Mutters, PA-C   06/16/2019

## 2019-11-03 ENCOUNTER — Encounter: Payer: Self-pay | Admitting: Gastroenterology

## 2019-11-03 ENCOUNTER — Ambulatory Visit: Payer: PPO | Admitting: Gastroenterology

## 2019-11-03 VITALS — BP 110/72 | HR 73 | Temp 98.4°F | Ht 64.0 in | Wt 98.0 lb

## 2019-11-03 DIAGNOSIS — K219 Gastro-esophageal reflux disease without esophagitis: Secondary | ICD-10-CM

## 2019-11-03 DIAGNOSIS — K589 Irritable bowel syndrome without diarrhea: Secondary | ICD-10-CM

## 2019-11-03 DIAGNOSIS — R1013 Epigastric pain: Secondary | ICD-10-CM | POA: Diagnosis not present

## 2019-11-03 LAB — CBC WITH DIFFERENTIAL/PLATELET
Absolute Monocytes: 725 cells/uL (ref 200–950)
Basophils Absolute: 28 cells/uL (ref 0–200)
Basophils Relative: 0.4 %
Eosinophils Absolute: 41 cells/uL (ref 15–500)
Eosinophils Relative: 0.6 %
HCT: 35.4 % (ref 35.0–45.0)
Hemoglobin: 11.5 g/dL — ABNORMAL LOW (ref 11.7–15.5)
Lymphs Abs: 1449 cells/uL (ref 850–3900)
MCH: 29.3 pg (ref 27.0–33.0)
MCHC: 32.5 g/dL (ref 32.0–36.0)
MCV: 90.1 fL (ref 80.0–100.0)
MPV: 10.4 fL (ref 7.5–12.5)
Monocytes Relative: 10.5 %
Neutro Abs: 4658 cells/uL (ref 1500–7800)
Neutrophils Relative %: 67.5 %
Platelets: 235 10*3/uL (ref 140–400)
RBC: 3.93 10*6/uL (ref 3.80–5.10)
RDW: 13.7 % (ref 11.0–15.0)
Total Lymphocyte: 21 %
WBC: 6.9 10*3/uL (ref 3.8–10.8)

## 2019-11-03 LAB — URINALYSIS, ROUTINE W REFLEX MICROSCOPIC
Bilirubin Urine: NEGATIVE
Glucose, UA: NEGATIVE
Hgb urine dipstick: NEGATIVE
Ketones, ur: NEGATIVE
Leukocytes,Ua: NEGATIVE
Nitrite: NEGATIVE
Protein, ur: NEGATIVE
Specific Gravity, Urine: 1.008 (ref 1.001–1.03)
pH: 7 (ref 5.0–8.0)

## 2019-11-03 LAB — COMPLETE METABOLIC PANEL WITH GFR
AG Ratio: 2 (calc) (ref 1.0–2.5)
ALT: 14 U/L (ref 6–29)
AST: 18 U/L (ref 10–35)
Albumin: 4.7 g/dL (ref 3.6–5.1)
Alkaline phosphatase (APISO): 97 U/L (ref 37–153)
BUN: 18 mg/dL (ref 7–25)
CO2: 31 mmol/L (ref 20–32)
Calcium: 9.1 mg/dL (ref 8.6–10.4)
Chloride: 94 mmol/L — ABNORMAL LOW (ref 98–110)
Creat: 0.83 mg/dL (ref 0.60–0.88)
GFR, Est African American: 76 mL/min/{1.73_m2} (ref >=60)
GFR, Est Non African American: 65 mL/min/{1.73_m2} (ref >=60)
Globulin: 2.4 g/dL (calc) (ref 1.9–3.7)
Glucose, Bld: 132 mg/dL — ABNORMAL HIGH (ref 65–99)
Potassium: 4.3 mmol/L (ref 3.5–5.3)
Sodium: 132 mmol/L — ABNORMAL LOW (ref 135–146)
Total Bilirubin: 0.5 mg/dL (ref 0.2–1.2)
Total Protein: 7.1 g/dL (ref 6.1–8.1)

## 2019-11-03 LAB — CORTISOL: Cortisol, Plasma: 18 ug/dL

## 2019-11-03 LAB — LIPASE: Lipase: 69 U/L — ABNORMAL HIGH (ref 7–60)

## 2019-11-03 LAB — URINE CULTURE
MICRO NUMBER:: 10459751
SPECIMEN QUALITY:: ADEQUATE

## 2019-11-03 LAB — AMYLASE: Amylase: 67 U/L (ref 21–101)

## 2019-11-03 NOTE — Patient Instructions (Addendum)
Start taking your dicyclomine 1 tablet by mouth 30-60 minutes before meals and not as needed.   You can take over the counter Tums as needed and Gas-x four times a day as needed for gas and bloating.   Follow up with your primary care physician.  Call our office back if your symptoms fail to improve.   Thank you for choosing me and [facility].  Pricilla Riffle. Dagoberto Ligas., MD., Marval Regal

## 2019-11-03 NOTE — Progress Notes (Signed)
    History of Present Illness: This is an 86 year old female with GERD and IBS. She is accompanied by her sister in law.  She relates epigastric discomfort and abdominal bloating that generally follows meals.  Occasionally she has heartburn.  She notes difficulties epigastric pain and reflux when she eats late at night.  She has frequent gas and dyspepsia.  Her last office visit in was in December 2019 and I was concerned about her ongoing ability to correctly identify medications and medication dosing schedules.  Her sister-in-law and others are helping her with medications and dosing.  She has a history of a splenic and right renal infarct in 2019. Labs yesterday Na=132, Hbg=11.5, lipase=69.  She is taking dicyclomine as needed.   CT renal stone protocol 06/05/2019 IMPRESSION: 1. No findings to account for the patient's history of hematuria. Specifically, no urinary tract calculi no findings of urinary tract obstruction are noted at this time. 2. Small volume of ascites. This is of uncertain etiology, however, this may relate to congestive heart failure given the appearance of the lower thorax (cardiomegaly with evidence of mild interstitial pulmonary edema). 3. Colonic diverticulosis without definite findings to suggest an acute diverticulitis at this time. 4. Probable biliary sludge and/or noncalcified gallstones lying dependently in the gallbladder, without evidence of acute cholecystitis at this time. 5. Aortic atherosclerosis, in addition to left main, and 2 vessel coronary artery disease. 6. Additional incidental findings, as above  Current Medications, Allergies, Past Medical History, Past Surgical History, Family History and Social History were reviewed in [employer] record.   Physical Exam: General: Well developed, well nourished, thin, elderly, no acute distress Head: Normocephalic and atraumatic Eyes:  sclerae anicteric, EOMI Ears: Normal auditory  acuity Mouth: Not examined, mask on during Covid-19 pandemic Lungs: Clear throughout to auscultation Heart: Regular rate and rhythm; no murmurs, rubs or bruits Abdomen: Soft, non tender and non distended. No masses, hepatosplenomegaly or hernias noted. Normal Bowel sounds Rectal: Not done Musculoskeletal: Symmetrical with no gross deformities  Pulses:  Normal pulses noted Extremities: No clubbing, cyanosis, edema or deformities noted Neurological: Alert oriented x 4, grossly nonfocal Psychological:  Alert and cooperative. Normal mood and affect   Assessment and Recommendations:  1. Epigastric pain. CT showed probable GB sludge vs noncalcified stones and small volume of ascites of uncertain etiology. Suspected GERD and IBS. R/O cholecystitis, symptomatic cholelithiasis, gastroparesis, esophagitis, etc.  Blood work is reassuring.  Proceed with abd [place] as scheduled on Friday.  Change dicyclomine to 20 mg p.o. AC taken 30 to 60 minutes before meals (not as needed).  Follow all standard antireflux measures including avoiding all food, beverage intake for 3-4 hours before recumbency.  Continue omeprazole 40 mg daily (not as needed).  If abdominal ultrasound is unremarkable and symptoms persist consider CT AP with contrast and EGD for further evaluation. REV in 4 weeks.

## 2019-11-06 ENCOUNTER — Ambulatory Visit
Admission: RE | Admit: 2019-11-06 | Discharge: 2019-11-06 | Disposition: A | Discharge status: Home / Self Care | Payer: PPO | Source: Ambulatory Visit | Attending: Physician Assistant | Admitting: Physician Assistant

## 2019-11-06 DIAGNOSIS — R1013 Epigastric pain: Secondary | ICD-10-CM

## 2019-11-06 DIAGNOSIS — K828 Other specified diseases of gallbladder: Secondary | ICD-10-CM | POA: Diagnosis not present

## 2019-11-10 DIAGNOSIS — F419 Anxiety disorder, unspecified: Secondary | ICD-10-CM | POA: Diagnosis not present

## 2019-11-10 DIAGNOSIS — I0981 Rheumatic heart failure: Secondary | ICD-10-CM | POA: Diagnosis not present

## 2019-11-10 DIAGNOSIS — K219 Gastro-esophageal reflux disease without esophagitis: Secondary | ICD-10-CM | POA: Diagnosis not present

## 2019-11-10 DIAGNOSIS — N280 Ischemia and infarction of kidney: Secondary | ICD-10-CM | POA: Diagnosis not present

## 2019-11-10 DIAGNOSIS — E559 Vitamin D deficiency, unspecified: Secondary | ICD-10-CM | POA: Diagnosis not present

## 2019-11-10 DIAGNOSIS — N189 Chronic kidney disease, unspecified: Secondary | ICD-10-CM | POA: Diagnosis not present

## 2019-11-10 DIAGNOSIS — E871 Hypo-osmolality and hyponatremia: Secondary | ICD-10-CM | POA: Diagnosis not present

## 2019-11-10 DIAGNOSIS — D735 Infarction of spleen: Secondary | ICD-10-CM | POA: Diagnosis not present

## 2019-11-10 DIAGNOSIS — B3781 Candidal esophagitis: Secondary | ICD-10-CM | POA: Diagnosis not present

## 2019-11-10 DIAGNOSIS — I5043 Acute on chronic combined systolic (congestive) and diastolic (congestive) heart failure: Secondary | ICD-10-CM | POA: Diagnosis not present

## 2019-11-10 DIAGNOSIS — Z8673 Personal history of transient ischemic attack (TIA), and cerebral infarction without residual deficits: Secondary | ICD-10-CM | POA: Diagnosis not present

## 2019-11-10 DIAGNOSIS — Z9181 History of falling: Secondary | ICD-10-CM | POA: Diagnosis not present

## 2019-11-10 DIAGNOSIS — K5730 Diverticulosis of large intestine without perforation or abscess without bleeding: Secondary | ICD-10-CM | POA: Diagnosis not present

## 2019-11-10 DIAGNOSIS — Z7901 Long term (current) use of anticoagulants: Secondary | ICD-10-CM | POA: Diagnosis not present

## 2019-11-10 DIAGNOSIS — I4891 Unspecified atrial fibrillation: Secondary | ICD-10-CM | POA: Diagnosis not present

## 2019-11-10 DIAGNOSIS — J439 Emphysema, unspecified: Secondary | ICD-10-CM | POA: Diagnosis not present

## 2019-11-10 DIAGNOSIS — I130 Hypertensive heart and chronic kidney disease with heart failure and stage 1 through stage 4 chronic kidney disease, or unspecified chronic kidney disease: Secondary | ICD-10-CM | POA: Diagnosis not present

## 2019-11-10 DIAGNOSIS — E785 Hyperlipidemia, unspecified: Secondary | ICD-10-CM | POA: Diagnosis not present

## 2019-11-10 DIAGNOSIS — I088 Other rheumatic multiple valve diseases: Secondary | ICD-10-CM | POA: Diagnosis not present

## 2019-11-10 DIAGNOSIS — M159 Polyosteoarthritis, unspecified: Secondary | ICD-10-CM | POA: Diagnosis not present

## 2019-11-10 DIAGNOSIS — M797 Fibromyalgia: Secondary | ICD-10-CM | POA: Diagnosis not present

## 2019-11-17 ENCOUNTER — Other Ambulatory Visit: Payer: Self-pay | Admitting: Internal Medicine

## 2019-11-17 DIAGNOSIS — G500 Trigeminal neuralgia: Secondary | ICD-10-CM

## 2019-11-19 ENCOUNTER — Other Ambulatory Visit: Payer: Self-pay

## 2019-11-19 ENCOUNTER — Ambulatory Visit: Payer: PPO | Admitting: Cardiology

## 2019-11-19 ENCOUNTER — Encounter: Payer: Self-pay | Admitting: Cardiology

## 2019-11-19 ENCOUNTER — Other Ambulatory Visit: Payer: Self-pay | Admitting: Internal Medicine

## 2019-11-19 ENCOUNTER — Telehealth: Payer: Self-pay | Admitting: Radiology

## 2019-11-19 VITALS — BP 130/68 | HR 65 | Temp 97.9°F | Ht 64.0 in | Wt 98.0 lb

## 2019-11-19 DIAGNOSIS — F411 Generalized anxiety disorder: Secondary | ICD-10-CM | POA: Insufficient documentation

## 2019-11-19 DIAGNOSIS — Z8673 Personal history of transient ischemic attack (TIA), and cerebral infarction without residual deficits: Secondary | ICD-10-CM | POA: Diagnosis not present

## 2019-11-19 DIAGNOSIS — R002 Palpitations: Secondary | ICD-10-CM | POA: Diagnosis not present

## 2019-11-19 DIAGNOSIS — F419 Anxiety disorder, unspecified: Secondary | ICD-10-CM | POA: Diagnosis not present

## 2019-11-19 DIAGNOSIS — R55 Syncope and collapse: Secondary | ICD-10-CM | POA: Diagnosis not present

## 2019-11-19 DIAGNOSIS — R1013 Epigastric pain: Secondary | ICD-10-CM | POA: Diagnosis not present

## 2019-11-19 DIAGNOSIS — I4891 Unspecified atrial fibrillation: Secondary | ICD-10-CM | POA: Diagnosis not present

## 2019-11-19 NOTE — Assessment & Plan Note (Signed)
Dr Fuller Plan following- abdominal US done 11/06/2019

## 2019-11-19 NOTE — Assessment & Plan Note (Addendum)
Suspect this was secondary to transient bradycardia- she was reluctant to decrease her medications 10/09/2019. She seems to be doing well now off Lanoxin but will check a 7 day ZIO with her c/o palpitations.

## 2019-11-19 NOTE — Patient Instructions (Addendum)
Medication Instructions:  Your physician recommends that you continue on your current medications as directed. Please refer to the Current Medication list given to you today.  *If you need a refill on your cardiac medications before your next appointment, please call your pharmacy*   Testing/Procedures:  ZIO XT- Long Term Monitor Instructions   Your physician has requested you wear your ZIO patch monitor__7__days.   This is a single patch monitor.  Irhythm supplies one patch monitor per enrollment.  Additional stickers are not available.   Please do not apply patch if you will be having a Nuclear Stress Test, Echocardiogram, Cardiac CT, MRI, or Chest Xray during the time frame you would be wearing the monitor. The patch cannot be worn during these tests.  You cannot remove and re-apply the ZIO XT patch monitor.   Your ZIO patch monitor will be sent USPS Priority mail from [facility] directly to your home address. The monitor may also be mailed to a PO BOX if home delivery is not available.   It may take 3-5 days to receive your monitor after you have been enrolled.   Once you have received you monitor, please review enclosed instructions.  Your monitor has already been registered assigning a specific monitor serial # to you.   Applying the monitor   Shave hair from upper left chest.   Hold abrader disc by orange tab.  Rub abrader in 40 strokes over left upper chest as indicated in your monitor instructions.   Clean area with 4 enclosed alcohol pads .  Use all pads to assure are is cleaned thoroughly.  Let dry.   Apply patch as indicated in monitor instructions.  Patch will be place under collarbone on left side of chest with arrow pointing upward.   Rub patch adhesive wings for 2 minutes.Remove white label marked "1".  Remove white label marked "2".  Rub patch adhesive wings for 2 additional minutes.   While looking in a mirror, press and release button in center of patch.   A small green light will flash 3-4 times .  This will be your only indicator the monitor has been turned on.     Do not shower for the first 24 hours.  You may shower after the first 24 hours.   Press button if you feel a symptom. You will hear a small click.  Record Date, Time and Symptom in the Patient Log Book.   When you are ready to remove patch, follow instructions on last 2 pages of Patient Log Book.  Stick patch monitor onto last page of Patient Log Book.   Place Patient Log Book in [place] box.  Use locking tab on box and tape box closed securely.  The [employer] and [employer] has [employer] on it.  Please place in mailbox as soon as possible.  Your physician should have your test results approximately 7 days after the monitor has been mailed back to [facility].   Call [place] at [phone] if you have questions regarding your ZIO XT patch monitor.  Call them immediately if you see an orange light blinking on your monitor.   If your monitor falls off in less than 4 days contact our Monitor department at [phone].  If your monitor becomes loose or falls off after 4 days call [employer] at [phone] for suggestions on securing your monitor.     Follow-Up: At [facility] you and your health needs are our priority.  As part  of our continuing mission to provide you with exceptional heart care, we have created designated Provider Care Teams.  These Care Teams include your primary Cardiologist (physician) and Advanced Practice Providers (APPs -  Physician Assistants and Nurse Practitioners) who all work together to provide you with the care you need, when you need it.  We recommend signing up for the patient portal called "MyChart".  Sign up information is provided on this After Visit Summary.  MyChart is used to connect with patients for Virtual Visits (Telemedicine).  Patients are able to view lab/test results, encounter notes, upcoming appointments, etc.   Non-urgent messages can be sent to your provider as well.   To learn more about what you can do with MyChart, go to [email or web address].    Your next appointment:   You have been scheduled for an office visit with Debra White, [place] on Thursday, 01/07/20 at 10:15 AM.  You have been scheduled for a follow-up appointment at [place] with Debra White, [place] on Monday, 12/21/19 at 11:00 AM.

## 2019-11-19 NOTE — Assessment & Plan Note (Signed)
On elliquis 2.5mg  BID due to age > 48 and weight less than 60 kg.

## 2019-11-19 NOTE — Assessment & Plan Note (Signed)
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

## 2019-11-19 NOTE — Assessment & Plan Note (Signed)
On Klonopin BID

## 2019-11-19 NOTE — Progress Notes (Signed)
Cardiology Office Note:    Date:  11/19/2019   ID:  Debra White, DOB 20-Oct-1933, MRN [id number]  PCP:  Unk Pinto, MD  Cardiologist:  Skeet Latch, MD  Electrophysiologist:  None   Referring MD: Unk Pinto, MD   Chief Complaint  Patient presents with  . Follow-up    6 weeks.  F/U after admission for syncope  History of Present Illness:    Debra White is a 86 y.o. female with a hx of chronic atrial fibrillation on chronic anticoagulation, prior CVA, hypertension, COPD, and anxiety,  She weighs less than 100 lbs. I saw her on 10/09/2019 and her HR was 44.  I suggested stopping her Lanoxin, and decreasing her Atenolol but she was reluctant as she does not tolerate palpitations.  Two days later she was admitted with syncope.  She was kept in the hospital till 10/12/2019 and discharged on Atenolol 50 mg daily.  She is in the office today for follow up, her sister accompanied her. The patient complains of epigastric upset and palpitations - worse in the morning when she takes Prilosec and Bentyl (she saw Dr Fuller Plan 11/03/2019). In the office she is in AF with CVR.   Past Medical History:  Diagnosis Date  . Acute on chronic combined systolic and diastolic CHF (congestive heart failure) ([place]) 06/29/2019  . Anxiety   . Arthritis   . Blood transfusion   . C. difficile colitis   . Candida esophagitis ([place]) 2013   EGD  . Cataract   . Chronic kidney disease    STONES  . Diverticulitis 04/05/2017  . Diverticulosis of colon (without mention of hemorrhage) 2007   Colonoscopy  . Dysrhythmia    RX  . Emphysema of lung ([place])   . Family history of colon cancer    sister  . Fibromyalgia   . Fracture, zygoma closed ([place]) 07/13/2011  . GERD (gastroesophageal reflux disease)   . Headache(784.0)   . Hyperlipemia   . Hypertension   . Kidney stones   . Ptosis, bilateral   . Renal infarct ([place])   . Splenic infarct     Past Surgical History:  Procedure Laterality  Date  . ABDOMINAL HYSTERECTOMY    . BACK SURGERY     X2  . BACK SURGERY    . BREAST EXCISIONAL BIOPSY    . CERVICAL DISC SURGERY    . COLONOSCOPY    . FACIAL FRACTURE SURGERY    . HAND SURGERY     BIL   . ORIF TRIPOD FRACTURE  07/13/2011   Procedure: OPEN REDUCTION INTERNAL FIXATION (ORIF) TRIPOD FRACTURE;  Surgeon: Tyson Alias, MD;  Location: [place];  Service: ENT;  Laterality: Right;  ORIF RIGHT ZYGOMA, ORBITAL FLOOR EXPLORATION WITH FROST STITCH (TEMPORARY TARSORRHAPHY)  . PTOSIS REPAIR Bilateral 11/22/2016   Procedure: INTERNAL PTOSIS REPAIR;  Surgeon: Clista Bernhardt, MD;  Location: [place];  Service: Ophthalmology;  Laterality: Bilateral;  . SHOULDER ARTHROSCOPY W/ ROTATOR CUFF REPAIR     LFT    Current Medications: Current Meds  Medication Sig  . acetaminophen (TYLENOL) 500 MG tablet Take 500-1,000 mg by mouth See admin instructions. Take 1 tablet (500 mg) by mouth every morning, take 2 tablets (1000 mg) with lunch and 1 tablet (500 mg) at night  . atenolol (TENORMIN) 100 MG tablet Take 0.5 tablets (50 mg total) by mouth daily.  . Cholecalciferol (VITAMIN D-3) 125 MCG (5000 UT) TABS Take 5,000 Units by mouth daily.  . clonazePAM (  KLONOPIN) 1 MG tablet TAKE 1/2 TO 1 TABLET BY MOUTH 2-3 TIMES DAILY ONLY IF NEEDED FOR ANXIETY ATTACK.Marland KitchenMarland KitchenLIMIT TO 5 DAYS PER WEEK TO AVOID ADDICTION  . conjugated estrogens (PREMARIN) vaginal cream Place 1 Applicatorful vaginally See admin instructions. Apply one applicatorful vaginally twice weekly - every Wednesday and Saturday night  . diclofenac Sodium (VOLTAREN) 1 % GEL Apply 2 grams 3 to 4 x / day as needed for Pain (Patient taking differently: Apply 2 g topically 2 (two) times daily as needed (pain). )  . dicyclomine (BENTYL) 20 MG tablet Take  1 tablet  4 x /day at Mealtimes & Bedtime as needed for Cramping, Nausea  or Bloating  . gabapentin (NEURONTIN) 600 MG tablet TAKE 1/2 TO 1 TABLET BY MOUTH 3 TIMES DAILY FOR FACIAL PAIN  . lovastatin  (MEVACOR) 20 MG tablet Take 1 tablet at Bedtime for Cholesterol (Patient taking differently: Take 20 mg by mouth at bedtime. )  . olmesartan (BENICAR) 40 MG tablet Take 1 tablet Daily for BP (Patient taking differently: Take 20 mg by mouth at bedtime. For BP)  . omeprazole (PRILOSEC) 40 MG capsule Take 1 capsule Daily for Indigestion & Heartburn (Patient taking differently: Take 40 mg by mouth daily before breakfast. for Indigestion & Heartburn)  . polyvinyl alcohol (ARTIFICIAL TEARS) 1.4 % ophthalmic solution Place 1 drop into both eyes 2 (two) times daily as needed for dry eyes.  . [DISCONTINUED] apixaban (ELIQUIS) 5 MG TABS tablet Take 1 tablet 2 x /day to Prevent Blood Clots (Patient taking differently: Take 2.5 mg by mouth 2 (two) times daily. )     Allergies:   Latex, Amoxicillin, Aspirin, Barbiturates, Celebrex [celecoxib], Codeine, Evista [raloxifene], Lipitor [atorvastatin], Morphine and related, O[place]vail [ketoprofen], Pantoprazole, Paraffin, Prochlorperazine, Proloprim [trimethoprim], Venlafaxine, Vibra-tab [doxycycline], Vioxx [rofecoxib], Betadine [povidone iodine], and Caffeine   Social History   Socioeconomic History  . Marital status: Married    Spouse name: Not on file  . Number of children: 1  . Years of education: 43  . Highest education level: Not on file  Occupational History  . Occupation: Retired    Fish farm manager: RETIRED  Tobacco Use  . Smoking status: Never Smoker  . Smokeless tobacco: Never Used  Substance and Sexual Activity  . Alcohol use: No  . Drug use: No  . Sexual activity: Not on file    Comment: HYSTER  Other Topics Concern  . Not on file  Social History Narrative   [place]   Right-handed.   No caffeine use.   Social Determinants of Health   Financial Resource Strain:   . Difficulty of Paying Living Expenses:   Food Insecurity:   . Worried About Charity fundraiser in the Last Year:   . Arboriculturist in the Last  Year:   Transportation Needs:   . Film/video editor (Medical):   Marland Kitchen Lack of Transportation (Non-Medical):   Physical Activity:   . Days of Exercise per Week:   . Minutes of Exercise per Session:   Stress:   . Feeling of Stress :   Social Connections:   . Frequency of Communication with Friends and Family:   . Frequency of Social Gatherings with Friends and Family:   . Attends Religious Services:   . Active Member of Clubs or Organizations:   . Attends Archivist Meetings:   Marland Kitchen Marital Status:      Family History: The patient's family history includes Breast cancer (  age of onset: 26) in her sister; Cancer in her sister, sister, and sister; Colon cancer in her sister; Heart attack in her father; Heart disease in her father, mother, and sister; Hyperlipidemia in her daughter; Hypertension in her daughter.  ROS:   Please see the history of present illness.     All other systems reviewed and are negative.  EKGs/Labs/Other Studies Reviewed:    The following studies were reviewed today: Echo 10/11/2019- IMPRESSIONS    1. Left ventricular ejection fraction, by estimation, is 55 to 60%. The  left ventricle has normal function. The left ventricle has no regional  wall motion abnormalities. Left ventricular diastolic function could not  be evaluated.  2. Right ventricular systolic function is normal. The right ventricular  size is normal. There is normal pulmonary artery systolic pressure. The  estimated right ventricular systolic pressure is 123XX123 mmHg.  3. Left atrial size was severely dilated.  4. Right atrial size was severely dilated.  5. The mitral valve is grossly normal. Mild mitral valve regurgitation.  No evidence of mitral stenosis.  6. Tricuspid valve regurgitation is moderate.  7. The aortic valve is tricuspid. Aortic valve regurgitation is not  visualized. Mild aortic valve sclerosis is present, with no evidence of  aortic valve stenosis.  8. The  inferior vena cava is normal in size with greater than 50%  respiratory variability, suggesting right atrial pressure of 3 mmHg.   Comparison(s): No significant change from prior study.   EKG:  EKG is ordered today.  The ekg ordered today demonstrates AF with VR 65  Recent Labs: 10/06/2019: TSH 0.61 10/12/2019: Magnesium 1.9 11/02/2019: ALT 14; BUN 18; Creat 0.83; Hemoglobin 11.5; Platelets 235; Potassium 4.3; Sodium 132  Recent Lipid Panel    Component Value Date/Time   CHOL 162 10/06/2019 1057   TRIG 91 10/06/2019 1057   HDL 93 10/06/2019 1057   CHOLHDL 1.7 10/06/2019 1057   VLDL 31 (H) 02/05/2017 1149   LDLCALC 52 10/06/2019 1057    Physical Exam:    VS:  BP 130/68 (BP Location: Right Arm, Patient Position: Sitting, Cuff Size: Normal)   Pulse 65   Temp 97.9 F (36.6 C)   Ht 5\' 4"  (1.626 m)   Wt 98 lb (44.5 kg)   BMI 16.82 kg/m     Wt Readings from Last 3 Encounters:  11/19/19 98 lb (44.5 kg)  11/03/19 98 lb (44.5 kg)  11/02/19 98 lb 9.6 oz (44.7 kg)     GEN:  Cachectic Caucasian female,   in no acute distress HEENT: Normal NECK: No JVD; No carotid bruits CARDIAC: irregularly irregular, no murmurs, rubs, gallops RESPIRATORY:  Clear to auscultation without rales, wheezing or rhonchi  ABDOMEN: Soft, non-tender, non-distended MUSCULOSKELETAL:  No edema; No deformity  SKIN: Warm and dry NEUROLOGIC:  Alert and oriented x 3 PSYCHIATRIC:  Normal affect   ASSESSMENT:    Syncope Suspect this was secondary to transient bradycardia- she was reluctant to decrease her medications 10/09/2019. She seems to be doing well now off Lanoxin but will check a 7 day ZIO with her c/o palpitations.   Atrial fibrillation with controlled ventricular response (HCC) On elliquis 2.5mg  BID due to age > 63 and weight less than 60 kg.  Epigastric discomfort Dr Fuller Plan following- abdominal US done 11/06/2019  History of embolic stroke [place]t brain stroke on MRI and history of splenic and renal  infarcts in the past  Anxiety On Klonopin BID   PLAN:  Check 7 day ZIO- keep f/u with Dr Fuller Plan.    Medication Adjustments/Labs and Tests Ordered: Current medicines are reviewed at length with the patient today.  Concerns regarding medicines are outlined above.  Orders Placed This Encounter  Procedures  . LONG TERM MONITOR (3-14 DAYS)   No orders of the defined types were placed in this encounter.   Patient Instructions  Medication Instructions:  Your physician recommends that you continue on your current medications as directed. Please refer to the Current Medication list given to you today.  *If you need a refill on your cardiac medications before your next appointment, please call your pharmacy*   Testing/Procedures:  ZIO XT- Long Term Monitor Instructions   Your physician has requested you wear your ZIO patch monitor__7__days.   This is a single patch monitor.  Ir[employer] supplies one patch monitor per enrollment.  Additional stickers are not available.   Please do not apply patch if you will be having a Nuclear Stress Test, Echocardiogram, Cardiac CT, MRI, or Chest Xray during the time frame you would be wearing the monitor. The patch cannot be worn during these tests.  You cannot remove and re-apply the ZIO XT patch monitor.   Your ZIO patch monitor will be sent USPS Priority mail from [facility] directly to your home address. The monitor may also be mailed to a PO BOX if home delivery is not available.   It may take 3-5 days to receive your monitor after you have been enrolled.   Once you have received you monitor, please review enclosed instructions.  Your monitor has already been registered assigning a specific monitor serial # to you.   Applying the monitor   Shave hair from upper left chest.   Hold abrader disc by orange tab.  Rub abrader in 40 strokes over left upper chest as indicated in your monitor instructions.   Clean area with 4 enclosed alcohol  pads .  Use all pads to assure are is cleaned thoroughly.  Let dry.   Apply patch as indicated in monitor instructions.  Patch will be place under collarbone on left side of chest with arrow pointing upward.   Rub patch adhesive wings for 2 minutes.Remove white label marked "1".  Remove white label marked "2".  Rub patch adhesive wings for 2 additional minutes.   While looking in a mirror, press and release button in center of patch.  A small green light will flash 3-4 times .  This will be your only indicator the monitor has been turned on.     Do not shower for the first 24 hours.  You may shower after the first 24 hours.   Press button if you feel a symptom. You will hear a small click.  Record Date, Time and Symptom in the Patient Log Book.   When you are ready to remove patch, follow instructions on last 2 pages of Patient Log Book.  Stick patch monitor onto last page of Patient Log Book.   Place Patient Log Book in [place] box.  Use locking tab on box and tape box closed securely.  The [employer] and [employer] has [employer] on it.  Please place in mailbox as soon as possible.  Your physician should have your test results approximately 7 days after the monitor has been mailed back to [facility].   Call [place] at [phone] if you have questions regarding your ZIO XT patch monitor.  Call them immediately if you see  an orange light blinking on your monitor.   If your monitor falls off in less than 4 days contact our Monitor department at [phone].  If your monitor becomes loose or falls off after 4 days call [employer] at [phone] for suggestions on securing your monitor.     Follow-Up: At [facility], you and your health needs are our priority.  As part of our continuing mission to provide you with exceptional heart care, we have created designated Provider Care Teams.  These Care Teams include your primary Cardiologist (physician) and Advanced  Practice Providers (APPs -  Physician Assistants and Nurse Practitioners) who all work together to provide you with the care you need, when you need it.  We recommend signing up for the patient portal called "MyChart".  Sign up information is provided on this After Visit Summary.  MyChart is used to connect with patients for Virtual Visits (Telemedicine).  Patients are able to view lab/test results, encounter notes, upcoming appointments, etc.  Non-urgent messages can be sent to your provider as well.   To learn more about what you can do with MyChart, go to [email or web address].    Your next appointment:   You have been scheduled for an office visit with Kerin Ransom, PA on Thursday, 01/07/20 at 10:15 AM.  You have been scheduled for a follow-up appointment at [place] with Ellouise Newer, [place] on Monday, 12/21/19 at 11:00 AM.     Signed, Kerin Ransom, PA-C  11/19/2019 11:24 AM    [place]

## 2019-11-19 NOTE — Telephone Encounter (Signed)
Enrolled patient for a 7 day Zio monitor to be mailed to patients home.  

## 2019-11-24 ENCOUNTER — Ambulatory Visit (INDEPENDENT_AMBULATORY_CARE_PROVIDER_SITE_OTHER): Payer: PPO

## 2019-11-24 DIAGNOSIS — R002 Palpitations: Secondary | ICD-10-CM | POA: Diagnosis not present

## 2019-11-24 DIAGNOSIS — I4891 Unspecified atrial fibrillation: Secondary | ICD-10-CM | POA: Diagnosis not present

## 2019-12-03 ENCOUNTER — Other Ambulatory Visit (INDEPENDENT_AMBULATORY_CARE_PROVIDER_SITE_OTHER): Payer: PPO

## 2019-12-03 DIAGNOSIS — I10 Essential (primary) hypertension: Secondary | ICD-10-CM | POA: Diagnosis not present

## 2019-12-09 ENCOUNTER — Other Ambulatory Visit: Payer: Self-pay

## 2019-12-09 DIAGNOSIS — Z1211 Encounter for screening for malignant neoplasm of colon: Secondary | ICD-10-CM

## 2019-12-09 LAB — POC HEMOCCULT BLD/STL (HOME/3-CARD/SCREEN)
Card #2 Fecal Occult Blod, POC: NEGATIVE
Card #3 Fecal Occult Blood, POC: NEGATIVE
Fecal Occult Blood, POC: NEGATIVE

## 2019-12-14 ENCOUNTER — Encounter: Payer: Self-pay | Admitting: Internal Medicine

## 2019-12-14 DIAGNOSIS — Z1212 Encounter for screening for malignant neoplasm of rectum: Secondary | ICD-10-CM | POA: Diagnosis not present

## 2019-12-14 DIAGNOSIS — Z1211 Encounter for screening for malignant neoplasm of colon: Secondary | ICD-10-CM | POA: Diagnosis not present

## 2019-12-14 NOTE — Progress Notes (Signed)
Annual Screening/Preventative Visit & Comprehensive Evaluation &  Examination     This very nice 86 y.o.  MWF  presents for a Screening /Preventative Visit & comprehensive evaluation and management of multiple medical co-morbidities.  Patient has been followed for HTN, ASHD / chAfib,  HLD, GERD, IBS, Prediabetes  and Vitamin D Deficiency. Recent Abd U/S in May was essentially Negative. Patient is seeing Dr Fuller Plan for ongoing c/o EG discomfort & bloating with tentative dx of GERD & IBS and recommended thst she take her Omeprazole & Dicyclomine on a regular basis.       HTN predates since 65.  In 2019, patient was started on Eliquis for hx/o Splenic and Rt Renal infarcts and in June 2020, she was discovered in Afib.  Patient's BP has been controlled at home and patient denies any cardiac symptoms as chest pain, palpitations, shortness of breath, dizziness or ankle swelling. Today's BP is at goal - 140/76.     Patient's hyperlipidemia is controlled with diet and Lovastatin. Patient denies myalgias or other medication SE's. Last lipids were at goal:  Lab Results  Component Value Date   CHOL 162 10/06/2019   HDL 93 10/06/2019   LDLCALC 52 10/06/2019   TRIG 91 10/06/2019   CHOLHDL 1.7 10/06/2019       Patient was dx'd T2_DM with A1c 6.8% in 2001, but after 30# weight loss by 2013, her A1c had dropped to 5.8% and then to  5.3% in 2016.   She denies reactive hypoglycemic symptoms, visual blurring, diabetic polys or paresthesias. Last A1c was near goal:  Lab Results  Component Value Date   HGBA1C 5.7 (H) 10/06/2019       Finally, patient has history of Vitamin D Deficiency and last Vitamin D was at goal:  Lab Results  Component Value Date   VD25OH 93 10/06/2019    Current Outpatient Medications on File Prior to Visit  Medication Sig  . acetaminophen (TYLENOL) 500 MG tablet Take 500-1,000 mg by mouth See admin instructions. Take 1 tablet (500 mg) by mouth every morning, take 2 tablets  (1000 mg) with lunch and 1 tablet (500 mg) at night  . apixaban (ELIQUIS) 5 MG TABS tablet Take 1 tablet 2 x  /day to Prevent Blood Clots  . atenolol (TENORMIN) 100 MG tablet Take 0.5 tablets (50 mg total) by mouth daily.  . Cholecalciferol (VITAMIN D-3) 125 MCG (5000 UT) TABS Take 5,000 Units by mouth daily.  . clonazePAM (KLONOPIN) 1 MG tablet TAKE 1/2 TO 1 TABLET BY MOUTH 2-3 TIMES DAILY ONLY IF NEEDED FOR ANXIETY ATTACK.Marland KitchenMarland KitchenLIMIT TO 5 DAYS PER WEEK TO AVOID ADDICTION  . conjugated estrogens (PREMARIN) vaginal cream Place 1 Applicatorful vaginally See admin instructions. Apply one applicatorful vaginally twice weekly - every Wednesday and Saturday night  . diclofenac Sodium (VOLTAREN) 1 % GEL Apply 2 grams 3 to 4 x / day as needed for Pain (Patient taking differently: Apply 2 g topically 2 (two) times daily as needed (pain). )  . dicyclomine (BENTYL) 20 MG tablet Take  1 tablet  4 x /day at Mealtimes & Bedtime as needed for Cramping, Nausea  or Bloating  . gabapentin (NEURONTIN) 600 MG tablet TAKE 1/2 TO 1 TABLET BY MOUTH 3 TIMES DAILY FOR FACIAL PAIN  . lovastatin (MEVACOR) 20 MG tablet Take 1 tablet at Bedtime for Cholesterol (Patient taking differently: Take 20 mg by mouth at bedtime. )  . olmesartan (BENICAR) 40 MG tablet Take 1 tablet Daily for BP (Patient  taking differently: Take 20 mg by mouth at bedtime. For BP)  . omeprazole (PRILOSEC) 40 MG capsule Take 1 capsule Daily for Indigestion & Heartburn (Patient taking differently: Take 40 mg by mouth daily before breakfast. for Indigestion & Heartburn)  . polyvinyl alcohol (ARTIFICIAL TEARS) 1.4 % ophthalmic solution Place 1 drop into both eyes 2 (two) times daily as needed for dry eyes.   No current facility-administered medications on file prior to visit.   Allergies  Allergen Reactions  . Latex Hives    TONGUE HAD BLISTERS WHEN HAD DENTAL SURGERY  . Amoxicillin Nausea And Vomiting  . Aspirin Other (See Comments)    Gi upset  .  Barbiturates Other (See Comments)    Unknown reaction  . Celebrex [Celecoxib] Other (See Comments)    Unknown reaction  . Codeine Itching  . Evista [Raloxifene] Other (See Comments)    Unknown reaction  . Lipitor [Atorvastatin] Other (See Comments)    Unknown reaction  . Morphine And Related Other (See Comments)    Unknown reaction  . Oruvail [Ketoprofen] Other (See Comments)    Unknown reaction  . Pantoprazole Diarrhea  . Paraffin Other (See Comments)    Unknown reaction  . Prochlorperazine Other (See Comments)    Uncontrolled shaking  . Proloprim [Trimethoprim] Other (See Comments)    Unknown reaction  . Venlafaxine Nausea Only  . Vibra-Tab [Doxycycline] Other (See Comments)    Unknown reaction  . Vioxx [Rofecoxib] Other (See Comments)    edema  . Betadine [Povidone Iodine] Rash  . Caffeine Palpitations   Past Medical History:  Diagnosis Date  . Acute on chronic combined systolic and diastolic CHF (congestive heart failure) ([place]) 06/29/2019  . Anxiety   . Arthritis   . Blood transfusion   . C. difficile colitis   . Candida esophagitis ([place]) 2013   EGD  . Cataract   . Chronic kidney disease    STONES  . Diverticulitis 04/05/2017  . Diverticulosis of colon (without mention of hemorrhage) 2007   Colonoscopy  . Dysrhythmia    RX  . Emphysema of lung ([place])   . Family history of colon cancer    sister  . Fibromyalgia   . Fracture, zygoma closed ([place]) 07/13/2011  . GERD (gastroesophageal reflux disease)   . Headache(784.0)   . Hyperlipemia   . Hypertension   . Kidney stones   . Ptosis, bilateral   . Renal infarct ([place])   . Splenic infarct    Health Maintenance  Topic Date Due  . COVID-19 Vaccine (1) Never done  . TETANUS/TDAP  06/25/2013  . COLONOSCOPY  03/17/2017  . INFLUENZA VACCINE  01/24/2020  . DEXA SCAN  Completed  . PNA vac Low Risk Adult  Completed   Immunization History  Administered Date(s) Administered  . Influenza, High Dose Seasonal PF  03/25/2014, 04/05/2016, 06/02/2018, 03/12/2019  . Influenza-Unspecified 03/09/2013, 03/11/2015  . Pneumococcal Conjugate-13 12/21/2015  . Pneumococcal Polysaccharide-23 06/25/2002  . Td 06/26/2003   Last Colon - 03/17/2012 - Dr Sharlett Iles  - hyperplastic polyp   Last MGM - 09/02/2019  Past Surgical History:  Procedure Laterality Date  . ABDOMINAL HYSTERECTOMY    . BACK SURGERY     X2  . BACK SURGERY    . BREAST EXCISIONAL BIOPSY    . CERVICAL DISC SURGERY    . COLONOSCOPY    . FACIAL FRACTURE SURGERY    . HAND SURGERY     BIL   . ORIF TRIPOD FRACTURE  07/13/2011  Procedure: OPEN REDUCTION INTERNAL FIXATION (ORIF) TRIPOD FRACTURE;  Surgeon: Tyson Alias, MD;  Location: [facility] OR;  Service: ENT;  Laterality: Right;  ORIF RIGHT ZYGOMA, ORBITAL FLOOR EXPLORATION WITH FROST STITCH (TEMPORARY TARSORRHAPHY)  . PTOSIS REPAIR Bilateral 11/22/2016   Procedure: INTERNAL PTOSIS REPAIR;  Surgeon: Clista Bernhardt, MD;  Location: [facility];  Service: Ophthalmology;  Laterality: Bilateral;  . SHOULDER ARTHROSCOPY W/ ROTATOR CUFF REPAIR     LFT   Family History  Problem Relation Age of Onset  . Heart disease Mother   . Heart disease Father   . Heart attack Father   . Breast cancer Sister 37  . Colon cancer Sister   . Cancer Sister        breast  . Heart disease Sister   . Cancer Sister        colon  . Cancer Sister        colon  . Hypertension Daughter   . Hyperlipidemia Daughter    Social History   Tobacco Use  . Smoking status: Never Smoker  . Smokeless tobacco: Never Used  Vaping Use  . Vaping Use: Never used  Substance Use Topics  . Alcohol use: No  . Drug use: No    ROS Constitutional: Denies fever, chills, weight loss/gain, headaches, insomnia,  night sweats, and change in appetite. Does c/o fatigue. Eyes: Denies redness, blurred vision, diplopia, discharge, itchy, watery eyes.  ENT: Denies discharge, congestion, post nasal drip, epistaxis, sore throat, earache, hearing  loss, dental pain, Tinnitus, Vertigo, Sinus pain, snoring.  Cardio: Denies chest pain, palpitations, irregular heartbeat, syncope, dyspnea, diaphoresis, orthopnea, PND, claudication, edema Respiratory: denies cough, dyspnea, DOE, pleurisy, hoarseness, laryngitis, wheezing.  Gastrointestinal: Denies dysphagia, heartburn, reflux, water brash, pain, cramps, nausea, vomiting, bloating, diarrhea, constipation, hematemesis, melena, hematochezia, jaundice, hemorrhoids Genitourinary: Denies dysuria, frequency, urgency, nocturia, hesitancy, discharge, hematuria, flank pain Breast: Breast lumps, nipple discharge, bleeding.  Musculoskeletal: Denies arthralgia, myalgia, stiffness, Jt. Swelling, pain, limp, and strain/sprain. Denies falls. Skin: Denies puritis, rash, hives, warts, acne, eczema, changing in skin lesion Neuro: No weakness, tremor, incoordination, spasms, paresthesia, pain Psychiatric: Denies confusion, memory loss, sensory loss. Denies Depression. Endocrine: Denies change in weight, skin, hair change, nocturia, and paresthesia, diabetic polys, visual blurring, hyper / hypo glycemic episodes.  Heme/Lymph: No excessive bleeding, bruising, enlarged lymph nodes.  Physical Exam  BP 140/76   Pulse 72   Temp (!) 97.2 F (36.2 C)   Resp 16   Ht 5' 3.5" (1.613 m)   Wt 99 lb (44.9 kg)   BMI 17.26 kg/m   General Appearance: Well nourished, well groomed and in no apparent distress.  Eyes: PERRLA, EOMs, conjunctiva no swelling or erythema, normal fundi and vessels. Sinuses: No frontal/maxillary tenderness ENT/Mouth: EACs patent / TMs  nl. Nares clear without erythema, swelling, mucoid exudates. Oral hygiene is good. No erythema, swelling, or exudate. Tongue normal, non-obstructing. Tonsils not swollen or erythematous. Hearing normal.  Neck: Supple, thyroid not palpable. No bruits, nodes or JVD. Respiratory: Respiratory effort normal.  BS equal and clear bilateral without rales, rhonci, wheezing  or stridor. Cardio: Heart sounds are normal with regular rate and rhythm and no murmurs, rubs or gallops. Peripheral pulses are normal and equal bilaterally without edema. No aortic or femoral bruits. Chest: symmetric with normal excursions and percussion. Breasts: Symmetric, without lumps, nipple discharge, retractions, or fibrocystic changes.  Abdomen: Flat, soft with bowel sounds active. Nontender, no guarding, rebound, hernias, masses, or organomegaly.  Lymphatics: Non tender without lymphadenopathy.  Genitourinary:  Musculoskeletal: Full ROM all peripheral extremities, joint stability, 5/5 strength, and normal gait. Skin: Warm and dry without rashes, lesions, cyanosis, clubbing or  ecchymosis.  Neuro: Cranial nerves intact, reflexes equal bilaterally. Normal muscle tone, no cerebellar symptoms. Sensation intact.  Pysch: Alert and oriented X 3, normal affect, Insight and Judgment appropriate.   Assessment and Plan  1. Annual Preventative Screening Examination  2. Essential hypertension  - Urinalysis, Routine w reflex microscopic - CBC with Differential/Platelet - COMPLETE METABOLIC PANEL WITH GFR - Magnesium - TSH  3. Hyperlipidemia, mixed  - Advised patient to stop her lovastatin for now until  her GI sx's / Abd pain is better   - Lipid panel - TSH  4. Abnormal glucose  - Hemoglobin A1c - Insulin, random  5. Vitamin D deficiency  - VITAMIN D 25 Hydroxy  6. Atrial fibrillation with controlled ventricular response (HCC)  - TSH  7. Gastroesophageal reflux disease  - CBC with Differential/Platelet  8. History of embolic stroke   9. Thrombophilia (HCC)  - CBC with Differential/Platelet  10. Medication management  - Urinalysis, Routine w reflex microscopic - POC Hemoccult Bld/Stl (3-Cd Home Screen); Future - CBC with Differential/Platelet - COMPLETE METABOLIC PANEL WITH GFR - Magnesium - Lipid panel - TSH - Hemoglobin A1c - Insulin, random - VITAMIN D  25 Hydroxy   11. Screening for colorectal cancer  - POC Hemoccult Bld/Stl         Patient was counseled in prudent diet to achieve/maintain BMI less than 25 for weight control, BP monitoring, regular exercise and medications. Discussed med's effects and SE's. Screening labs and tests as requested with regular follow-up as recommended. Over 40 minutes of exam, counseling, chart review and high complex critical decision making was performed.   Kirtland Bouchard, MD

## 2019-12-14 NOTE — Patient Instructions (Signed)

## 2019-12-15 ENCOUNTER — Other Ambulatory Visit: Payer: Self-pay

## 2019-12-15 ENCOUNTER — Ambulatory Visit: Payer: PPO | Admitting: Physician Assistant

## 2019-12-15 ENCOUNTER — Ambulatory Visit (INDEPENDENT_AMBULATORY_CARE_PROVIDER_SITE_OTHER): Payer: PPO | Admitting: Internal Medicine

## 2019-12-15 VITALS — BP 140/76 | HR 72 | Temp 97.2°F | Resp 16 | Ht 63.5 in | Wt 99.0 lb

## 2019-12-15 DIAGNOSIS — I4891 Unspecified atrial fibrillation: Secondary | ICD-10-CM | POA: Diagnosis not present

## 2019-12-15 DIAGNOSIS — I10 Essential (primary) hypertension: Secondary | ICD-10-CM | POA: Diagnosis not present

## 2019-12-15 DIAGNOSIS — Z0001 Encounter for general adult medical examination with abnormal findings: Secondary | ICD-10-CM

## 2019-12-15 DIAGNOSIS — I2510 Atherosclerotic heart disease of native coronary artery without angina pectoris: Secondary | ICD-10-CM

## 2019-12-15 DIAGNOSIS — Z0000 Encounter for general adult medical examination without abnormal findings: Secondary | ICD-10-CM | POA: Diagnosis not present

## 2019-12-15 DIAGNOSIS — R7309 Other abnormal glucose: Secondary | ICD-10-CM

## 2019-12-15 DIAGNOSIS — E782 Mixed hyperlipidemia: Secondary | ICD-10-CM

## 2019-12-15 DIAGNOSIS — Z79899 Other long term (current) drug therapy: Secondary | ICD-10-CM

## 2019-12-15 DIAGNOSIS — Z136 Encounter for screening for cardiovascular disorders: Secondary | ICD-10-CM

## 2019-12-15 DIAGNOSIS — K589 Irritable bowel syndrome without diarrhea: Secondary | ICD-10-CM

## 2019-12-15 DIAGNOSIS — Z8673 Personal history of transient ischemic attack (TIA), and cerebral infarction without residual deficits: Secondary | ICD-10-CM

## 2019-12-15 DIAGNOSIS — Z1211 Encounter for screening for malignant neoplasm of colon: Secondary | ICD-10-CM

## 2019-12-15 DIAGNOSIS — D6859 Other primary thrombophilia: Secondary | ICD-10-CM | POA: Diagnosis not present

## 2019-12-15 DIAGNOSIS — E559 Vitamin D deficiency, unspecified: Secondary | ICD-10-CM | POA: Diagnosis not present

## 2019-12-15 DIAGNOSIS — K219 Gastro-esophageal reflux disease without esophagitis: Secondary | ICD-10-CM

## 2019-12-15 DIAGNOSIS — I700 Atherosclerosis of aorta: Secondary | ICD-10-CM

## 2019-12-15 MED ORDER — CLONAZEPAM 1 MG PO TABS: ORAL_TABLET | ORAL | 1 refills | Status: DC

## 2019-12-15 MED ORDER — DICYCLOMINE HCL 20 MG PO TABS: ORAL_TABLET | ORAL | 1 refills | Status: DC

## 2019-12-16 LAB — COMPLETE METABOLIC PANEL WITH GFR
AG Ratio: 1.8 (calc) (ref 1.0–2.5)
ALT: 24 U/L (ref 6–29)
AST: 29 U/L (ref 10–35)
Albumin: 4.6 g/dL (ref 3.6–5.1)
Alkaline phosphatase (APISO): 107 U/L (ref 37–153)
BUN: 12 mg/dL (ref 7–25)
CO2: 31 mmol/L (ref 20–32)
Calcium: 9.7 mg/dL (ref 8.6–10.4)
Chloride: 97 mmol/L — ABNORMAL LOW (ref 98–110)
Creat: 0.87 mg/dL (ref 0.60–0.88)
GFR, Est African American: 71 mL/min/{1.73_m2} (ref >=60)
GFR, Est Non African American: 62 mL/min/{1.73_m2} (ref >=60)
Globulin: 2.5 g/dL (calc) (ref 1.9–3.7)
Glucose, Bld: 99 mg/dL (ref 65–99)
Potassium: 4.7 mmol/L (ref 3.5–5.3)
Sodium: 136 mmol/L (ref 135–146)
Total Bilirubin: 0.4 mg/dL (ref 0.2–1.2)
Total Protein: 7.1 g/dL (ref 6.1–8.1)

## 2019-12-16 LAB — CBC WITH DIFFERENTIAL/PLATELET
Absolute Monocytes: 850 cells/uL (ref 200–950)
Basophils Absolute: 39 cells/uL (ref 0–200)
Basophils Relative: 0.5 %
Eosinophils Absolute: 39 cells/uL (ref 15–500)
Eosinophils Relative: 0.5 %
HCT: 35.3 % (ref 35.0–45.0)
Hemoglobin: 11.6 g/dL — ABNORMAL LOW (ref 11.7–15.5)
Lymphs Abs: 1131 cells/uL (ref 850–3900)
MCH: 30.1 pg (ref 27.0–33.0)
MCHC: 32.9 g/dL (ref 32.0–36.0)
MCV: 91.5 fL (ref 80.0–100.0)
MPV: 10.9 fL (ref 7.5–12.5)
Monocytes Relative: 10.9 %
Neutro Abs: 5741 cells/uL (ref 1500–7800)
Neutrophils Relative %: 73.6 %
Platelets: 205 10*3/uL (ref 140–400)
RBC: 3.86 10*6/uL (ref 3.80–5.10)
RDW: 13.6 % (ref 11.0–15.0)
Total Lymphocyte: 14.5 %
WBC: 7.8 10*3/uL (ref 3.8–10.8)

## 2019-12-16 LAB — LIPID PANEL
Cholesterol: 156 mg/dL (ref <200)
HDL: 90 mg/dL (ref >=50)
LDL Cholesterol (Calc): 46 mg/dL (calc)
Non-HDL Cholesterol (Calc): 66 mg/dL (calc) (ref <130)
Total CHOL/HDL Ratio: 1.7 (calc) (ref <5.0)
Triglycerides: 110 mg/dL (ref <150)

## 2019-12-16 LAB — HEMOGLOBIN A1C
Hgb A1c MFr Bld: 5.7 % of total Hgb — ABNORMAL HIGH (ref <5.7)
Mean Plasma Glucose: 117 (calc)
eAG (mmol/L): 6.5 (calc)

## 2019-12-16 LAB — URINALYSIS, ROUTINE W REFLEX MICROSCOPIC
Bilirubin Urine: NEGATIVE
Glucose, UA: NEGATIVE
Hgb urine dipstick: NEGATIVE
Ketones, ur: NEGATIVE
Leukocytes,Ua: NEGATIVE
Nitrite: NEGATIVE
Protein, ur: NEGATIVE
Specific Gravity, Urine: 1.005 (ref 1.001–1.03)
pH: 7.5 (ref 5.0–8.0)

## 2019-12-16 LAB — VITAMIN D 25 HYDROXY (VIT D DEFICIENCY, FRACTURES): Vit D, 25-Hydroxy: 93 ng/mL (ref 30–100)

## 2019-12-16 LAB — INSULIN, RANDOM: Insulin: 4.2 u[IU]/mL

## 2019-12-16 LAB — TSH: TSH: 0.95 mIU/L (ref 0.40–4.50)

## 2019-12-16 LAB — MAGNESIUM: Magnesium: 1.8 mg/dL (ref 1.5–2.5)

## 2019-12-16 NOTE — Progress Notes (Signed)
==========================================================  -    CBC - shows mild anemia which is stable  ==========================================================  -  Total  Chol = 156 and LDL chol = 46 - Both Excellent   - Very low risk for Heart Attack  / Stroke ==========================================================  -  A1c = 5.7%   - Avoid Sweets, Candy & White Stuff   - Rice, Potatoes, Breads &  Pasta ==========================================================  -  Vitamin D = 93 - Excellent ==========================================================  -  All Else - CBC - Kidneys - Electrolytes -  Liver - Magnesium & Thyroid   - all  Normal / OK ==========================================================

## 2019-12-18 DIAGNOSIS — R002 Palpitations: Secondary | ICD-10-CM | POA: Diagnosis not present

## 2019-12-18 DIAGNOSIS — I4891 Unspecified atrial fibrillation: Secondary | ICD-10-CM | POA: Diagnosis not present

## 2019-12-21 ENCOUNTER — Ambulatory Visit: Payer: PPO | Admitting: Physician Assistant

## 2019-12-28 NOTE — Progress Notes (Signed)
History of Present Illness:     At recent visit, she had ongoing c/o EG discomfort & bloating with tentative dx of GERD & IBS and  Dr Fuller Plan recommended thst she take her Omeprazole & Dicyclomine on a regular basis. She was provided new Rx's.  She also was encouraged to take her Klonopin on a regular basis as she attributes her GI sx's to "worry & stress". Her weight is down 3# over the last 2 weeks as she relates she's only eating potato soup. Still having some EG "soreness" despite Prilosec. Says her cramping & bloating is better on the Bentyl. Is taking Ensure  (prefers over Boost).   Wt Readings from Last 3 Encounters:  12/29/19 96 lb 9.6 oz (43.8 kg)  12/15/19 99 lb (44.9 kg)  11/19/19 98 lb (44.5 kg)   Medications  .  atenolol  100 MG tablet, Take 0.5 tablets  daily. Marland Kitchen  olmesartan  40 MG tablet, Take 1 tablet Daily for BP (Patient taking differently: Take 20 mg by mouth at bedtime. For BP) .  lovastatin  20 MG tablet, Take 1 tablet at Bedtime (Patient not taking: Reported on 12/29/2019) .  acetaminophen 500 MG tablet, Take 1 tablet morning, take 2 tablets w/lunch and 1 tablet  night .  apixaban (ELIQUIS) 5 MG TABS tablet, Take 1 tablet 2 x  /day .  Cholecalciferol (VITAMIN D-3) 125 MCG (5000 UT) TABS, Take 5,000 Units by mouth daily. .  clonazePAM 1 MG tablet, Take 1/2 to 1 tablet 3 x / day at Meals  .  conjugated estrogens  vaginal crm,  Apply  vaginally twice weekly  .  diclofenac  1 % GEL, Apply 2 grams 3 to 4 x / day as needed for Pain .  dicyclomine (BENTYL) 20 MG tablet, Take  1 tablet  4 x /day with  Meals & Bedtime .  gabapentin  600 MG tablet, TAKE 1/2 TO 1 TAB 3 TIMES DAILY FOR FACIAL PAIN .  Omeprazole 40 MG capsule, Take 1 capsule Daily for Indigestion & Heartburn  .  ARTIFICIAL TEARS1.4 % ophth soln,  1 drop into both eyes 2  times daily as needed  Problem list She has Headache, post-traumatic, chronic; Hypertension; Hyperlipidemia; GERD; Vitamin D deficiency; Atypical  facial pain; Medication management; Encounter for Medicare annual wellness exam; Mild malnutrition ([place]); COPD (chronic obstructive pulmonary disease) with emphysema ([place]); Tortuous aorta ([place]); Osteoporosis; Aortic atherosclerosis ([place]); Hepatic steatosis; Renal infarct ([place]); Chronic hyponatremia; Chronic hyperkalemia; Coronary atherosclerosis due to lipid rich plaque; Atrial fibrillation with controlled ventricular response ([place]); Diastolic CHF ([place]); History of embolic stroke; Syncope; Epigastric discomfort; and Anxiety on their problem list.   Observations/Objective:  BP 126/72   Pulse 64   Temp (!) 97.5 F (36.4 C)   Resp 16   Ht 5' 3.5" (1.613 m)   Wt 96 lb 9.6 oz (43.8 kg)   BMI 16.84 kg/m   HEENT - WNL. Neck - supple.  Chest - Clear equal BS. Cor - Nl HS. RRR w/o sig MGR. PP 1(+). No edema. MS- FROM w/o deformities.  Gait Nl. Neuro -  Nl w/o focal abnormalities.  Assessment and Plan:  1. Gastroesophageal reflux disease  - Continue Omeprazole   2. Irritable bowel syndrome  - Continue Bentyl  3. Weight loss - Add - mirtazapine (REMERON) 15 MG tablet; Take 1 tablet at Bedtime for Appetite  Dispense: 90 tablet; Refill: 1  Follow Up Instructions: 1 month OV  I discussed the assessment and treatment plan with the patient. The patient was provided an opportunity to ask questions and all were answered. The patient agreed with the plan and demonstrated an understanding of the instructions.       The patient was advised to call back or seek an in-person evaluation if the symptoms worsen or if the condition fails to improve as anticipated.  Kirtland Bouchard, MD

## 2019-12-28 NOTE — Patient Instructions (Addendum)
Gastroesophageal Reflux Disease  Gastroesophageal reflux (GER) happens when acid from the stomach flows up into the tube that connects the mouth and the stomach (esophagus). Normally, food travels down the esophagus and stays in the stomach to be digested. However, when a person has GER, food and stomach acid sometimes move back up into the esophagus. If this becomes a more serious problem, the person may be diagnosed with a disease called gastroesophageal reflux disease (GERD). GERD occurs when the reflux:  Happens often.  Causes frequent or severe symptoms.  Causes problems such as damage to the esophagus. When stomach acid comes in contact with the esophagus, the acid may cause soreness (inflammation) in the esophagus. Over time, GERD may create small holes (ulcers) in the lining of the esophagus. What are the causes? This condition is caused by a problem with the muscle between the esophagus and the stomach (lower esophageal sphincter, or LES). Normally, the LES muscle closes after food passes through the esophagus to the stomach. When the LES is weakened or abnormal, it does not close properly, and that allows food and stomach acid to go back up into the esophagus. The LES can be weakened by certain dietary substances, medicines, and medical conditions, including:  Tobacco use.  Pregnancy.  Having a hiatal hernia.  Alcohol use.  Certain foods and beverages, such as coffee, chocolate, onions, and peppermint. What increases the risk? You are more likely to develop this condition if you:  Have an increased body weight.  Have a connective tissue disorder.  Use NSAID medicines. What are the signs or symptoms? Symptoms of this condition include:  Heartburn.  Difficult or painful swallowing.  The feeling of having a lump in the throat.  Abitter taste in the mouth.  Bad breath.  Having a large amount of saliva.  Having an upset or bloated stomach.  Belching.  Chest  pain. Different conditions can cause chest pain. Make sure you see your health care provider if you experience chest pain.  Shortness of breath or wheezing.  Ongoing (chronic) cough or a night-time cough.  Wearing away of tooth enamel.  Weight loss. How is this diagnosed? Your health care provider will take a medical history and perform a physical exam. To determine if you have mild or severe GERD, your health care provider may also monitor how you respond to treatment. You may also have tests, including:  A test to examine your stomach and esophagus with a small camera (endoscopy).  A test thatmeasures the acidity level in your esophagus.  A test thatmeasures how much pressure is on your esophagus.  A barium swallow or modified barium swallow test to show the shape, size, and functioning of your esophagus. How is this treated? The goal of treatment is to help relieve your symptoms and to prevent complications. Treatment for this condition may vary depending on how severe your symptoms are. Your health care provider may recommend:  Changes to your diet.  Medicine.  Surgery. Follow these instructions at home: Eating and drinking   Follow a diet as recommended by your health care provider. This may involve avoiding foods and drinks such as: ? Coffee and tea (with or without caffeine). ? Drinks that containalcohol. ? Energy drinks and sports drinks. ? Carbonated drinks or sodas. ? Chocolate and cocoa. ? Peppermint and mint flavorings. ? Garlic and onions. ? Horseradish. ? Spicy and acidic foods, including peppers, chili powder, curry powder, vinegar, hot sauces, and barbecue sauce. ? Citrus fruit juices and citrus  fruits, such as oranges, lemons, and limes. ? Tomato-based foods, such as red sauce, chili, salsa, and pizza with red sauce. ? Fried and fatty foods, such as donuts, french fries, potato chips, and high-fat dressings. ? High-fat meats, such as hot dogs and fatty  cuts of red and white meats, such as rib eye steak, sausage, ham, and bacon. ? High-fat dairy items, such as whole milk, butter, and cream cheese.  Eat small, frequent meals instead of large meals.  Avoid drinking large amounts of liquid with your meals.  Avoid eating meals during the 2-3 hours before bedtime.  Avoid lying down right after you eat.  Do not exercise right after you eat. Lifestyle   Do not use any products that contain nicotine or tobacco, such as cigarettes, e-cigarettes, and chewing tobacco. If you need help quitting, ask your health care provider.  Try to reduce your stress by using methods such as yoga or meditation. If you need help reducing stress, ask your health care provider.  If you are overweight, reduce your weight to an amount that is healthy for you. Ask your health care provider for guidance about a safe weight loss goal. General instructions  Pay attention to any changes in your symptoms.  Take over-the-counter and prescription medicines only as told by your health care provider. Do not take aspirin, ibuprofen, or other NSAIDs unless your health care provider told you to do so.  Wear loose-fitting clothing. Do not wear anything tight around your waist that causes pressure on your abdomen.  Raise (elevate) the head of your bed about 6 inches (15 cm).  Avoid bending over if this makes your symptoms worse.  Keep all follow-up visits as told by your health care provider. This is important. Contact a health care provider if:  You have: ? New symptoms. ? Unexplained weight loss. ? Difficulty swallowing or it hurts to swallow. ? Wheezing or a persistent cough. ? A hoarse voice.  Your symptoms do not improve with treatment. Get help right away if you:  Have pain in your arms, neck, jaw, teeth, or back.  Feel sweaty, dizzy, or light-headed.  Have chest pain or shortness of breath.  Vomit and your vomit looks like blood or coffee  grounds.  Faint.  Have stool that is bloody or black.  Cannot swallow, drink, or eat. Summary  Gastroesophageal reflux happens when acid from the stomach flows up into the esophagus. GERD is a disease in which the reflux happens often, causes frequent or severe symptoms, or causes problems such as damage to the esophagus.  Treatment for this condition may vary depending on how severe your symptoms are. Your health care provider may recommend diet and lifestyle changes, medicine, or surgery.  Contact a health care provider if you have new or worsening symptoms.  Take over-the-counter and prescription medicines only as told by your health care provider. Do not take aspirin, ibuprofen, or other NSAIDs unless your health care provider told you to do so.  Keep all follow-up visits as told by your health care provider. This is important.   =============================================  Irritable Bowel Syndrome, Adult  Irritable bowel syndrome (IBS) is a group of symptoms that affects the organs responsible for digestion (gastrointestinal or GI tract). IBS is not one specific disease. To regulate how the GI tract works, the body sends signals back and forth between the intestines and the brain. If you have IBS, there may be a problem with these signals. As a result, the GI  tract does not function normally. The intestines may become more sensitive and overreact to certain things. This may be especially true when you eat certain foods or when you are under stress. There are four types of IBS. These may be determined based on the consistency of your stool (feces):  IBS with diarrhea.  IBS with constipation.  Mixed IBS.  Unsubtyped IBS. It is important to know which type of IBS you have. Certain treatments are more likely to be helpful for certain types of IBS. What are the causes? The exact cause of IBS is not known. What increases the risk? You may have a higher risk for IBS if  you:  Are female.  Are younger than 84.  Have a family history of IBS.  Have a mental health condition, such as depression, anxiety, or post-traumatic stress disorder.  Have had a bacterial infection of your GI tract. What are the signs or symptoms? Symptoms of IBS vary from person to person. The main symptom is abdominal pain or discomfort. Other symptoms usually include one or more of the following:  Diarrhea, constipation, or both.  Abdominal swelling or bloating.  Feeling full after eating a small or regular-sized meal.  Frequent gas.  Mucus in the stool.  A feeling of having more stool left after a bowel movement. Symptoms tend to come and go. They may be triggered by stress, mental health conditions, or certain foods. How is this diagnosed? This condition may be diagnosed based on a physical exam, your medical history, and your symptoms. You may have tests, such as:  Blood tests.  Stool test.  X-rays.  CT scan.  Colonoscopy. This is a procedure in which your GI tract is viewed with a long, thin, flexible tube. How is this treated? There is no cure for IBS, but treatment can help relieve symptoms. Treatment depends on the type of IBS you have, and may include:  Changes to your diet, such as: ? Avoiding foods that cause symptoms. ? Drinking more water. ? Following a low-FODMAP (fermentable oligosaccharides, disaccharides, monosaccharides, and polyols) diet for up to 6 weeks, or as told by your health care provider. FODMAPs are sugars that are hard for some people to digest. ? Eating more fiber. ? Eating medium-sized meals at the same times every day.  Medicines. These may include: ? Fiber supplements, if you have constipation. ? Medicine to control diarrhea (antidiarrheal medicines). ? Medicine to help control muscle tightening (spasms) in your GI tract (antispasmodic medicines). ? Medicines to help with mental health conditions, such as antidepressants or  tranquilizers.  Talk therapy or counseling.  Working with a diet and nutrition specialist (dietitian) to help create a food plan that is right for you.  Managing your stress. Follow these instructions at home: Eating and drinking  Eat a healthy diet.  Eat medium-sized meals at about the same time every day. Do not eat large meals.  Gradually eat more fiber-rich foods. These include whole grains, fruits, and vegetables. This may be especially helpful if you have IBS with constipation.  Eat a diet low in FODMAPs.  Drink enough fluid to keep your urine pale yellow.  Keep a journal of foods that seem to trigger symptoms.  Avoid foods and drinks that: ? Contain added sugar. ? Make your symptoms worse. Dairy products, caffeinated drinks, and carbonated drinks can make symptoms worse for some people. General instructions  Take over-the-counter and prescription medicines and supplements only as told by your health care provider.  Get  enough exercise. Do at least 150 minutes of moderate-intensity exercise each week.  Manage your stress. Getting enough sleep and exercise can help you manage stress.  Keep all follow-up visits as told by your health care provider and therapist. This is important. Alcohol Use  Do not drink alcohol if: ? Your health care provider tells you not to drink. ? You are pregnant, may be pregnant, or are planning to become pregnant.  If you drink alcohol, limit how much you have: ? 0-1 drink a day for women. ? 0-2 drinks a day for men.  Be aware of how much alcohol is in your drink. In the [place] one drink equals one typical bottle of beer (12 oz), one-half glass of wine (5 oz), or one shot of hard liquor (1 oz). Contact a health care provider if you have:  Constant pain.  Weight loss.  Difficulty or pain when swallowing.  Diarrhea that gets worse. Get help right away if you have:  Severe abdominal pain.  Fever.  Diarrhea with symptoms of  dehydration, such as dizziness or dry mouth.  Bright red blood in your stool.  Stool that is black and tarry.  Abdominal swelling.  Vomiting that does not stop.  Blood in your vomit. Summary  Irritable bowel syndrome (IBS) is not one specific disease. It is a group of symptoms that affects digestion.  Your intestines may become more sensitive and overreact to certain things. This may be especially true when you eat certain foods or when you are under stress.  There is no cure for IBS, but treatment can help relieve symptoms.   ======================================  Food Choices for Gastroesophageal Reflux Disease  When you have gastroesophageal reflux disease (GERD), the foods you eat and your eating habits are very important. Choosing the right foods can help ease the discomfort of GERD. Consider working with a diet and nutrition specialist (dietitian) to help you make healthy food choices. What general guidelines should I follow?  Eating plan  Choose healthy foods low in fat, such as fruits, vegetables, whole grains, low-fat dairy products, and lean meat, fish, and poultry.  Eat frequent, small meals instead of three large meals each day. Eat your meals slowly, in a relaxed setting. Avoid bending over or lying down until 2-3 hours after eating.  Limit high-fat foods such as fatty meats or fried foods.  Limit your intake of oils, butter, and shortening to less than 8 teaspoons each day.  Avoid the following: ? Foods that cause symptoms. These may be different for different people. Keep a food diary to keep track of foods that cause symptoms. ? Alcohol. ? Drinking large amounts of liquid with meals. ? Eating meals during the 2-3 hours before bed.  Cook foods using methods other than frying. This may include baking, grilling, or broiling. Lifestyle  Maintain a healthy weight. Ask your health care provider what weight is healthy for you. If you need to lose weight, work  with your health care provider to do so safely.  Exercise for at least 30 minutes on 5 or more days each week, or as told by your health care provider.  Avoid wearing clothes that fit tightly around your waist and chest.  Do not use any products that contain nicotine or tobacco, such as cigarettes and e-cigarettes. If you need help quitting, ask your health care provider.  Sleep with the head of your bed raised. Use a wedge under the mattress or blocks under the bed frame to raise  the head of the bed. What foods are not recommended? The items listed may not be a complete list. Talk with your dietitian about what dietary choices are best for you. Grains Pastries or quick breads with added fat. [place] toast. Vegetables Deep fried vegetables. [place] fries. Any vegetables prepared with added fat. Any vegetables that cause symptoms. For some people this may include tomatoes and tomato products, chili peppers, onions and garlic, and horseradish. Fruits Any fruits prepared with added fat. Any fruits that cause symptoms. For some people this may include citrus fruits, such as oranges, grapefruit, pineapple, and lemons. Meats and other protein foods High-fat meats, such as fatty beef or pork, hot dogs, ribs, ham, sausage, salami and bacon. Fried meat or protein, including fried fish and fried chicken. Nuts and nut butters. Dairy Whole milk and chocolate milk. Sour cream. Cream. Ice cream. Cream cheese. Milk shakes. Beverages Coffee and tea, with or without caffeine. Carbonated beverages. Sodas. Energy drinks. Fruit juice made with acidic fruits (such as orange or grapefruit). Tomato juice. Alcoholic drinks. Fats and oils Butter. Margarine. Shortening. Ghee. Sweets and desserts Chocolate and cocoa. Donuts. Seasoning and other foods Pepper. Peppermint and spearmint. Any condiments, herbs, or seasonings that cause symptoms. For some people, this may include curry, hot sauce, or vinegar-based salad  dressings. Summary  When you have gastroesophageal reflux disease (GERD), food and lifestyle choices are very important to help ease the discomfort of GERD.  Eat frequent, small meals instead of three large meals each day. Eat your meals slowly, in a relaxed setting. Avoid bending over or lying down until 2-3 hours after eating.  Limit high-fat foods such as fatty meat or fried foods.  =======================================   Diet for Irritable Bowel Syndrome When you have irritable bowel syndrome (IBS), it is very important to eat the foods and follow the eating habits that are best for your condition. IBS may cause various symptoms such as pain in the abdomen, constipation, or diarrhea. Choosing the right foods can help to ease the discomfort from these symptoms. Work with your health care provider and diet and nutrition specialist (dietitian) to find the eating plan that will help to control your symptoms. What are tips for following this plan?      Keep a food diary. This will help you identify foods that cause symptoms. Write down: ? What you eat and when you eat it. ? What symptoms you have. ? When symptoms occur in relation to your meals, such as "pain in abdomen 2 hours after dinner."  Eat your meals slowly and in a relaxed setting.  Aim to eat 5-6 small meals per day. Do not skip meals.  Drink enough fluid to keep your urine pale yellow.  Ask your health care provider if you should take an over-the-counter probiotic to help restore healthy bacteria in your gut (digestive tract). ? Probiotics are foods that contain good bacteria and yeasts.  Your dietitian may have specific dietary recommendations for you based on your symptoms. He or she may recommend that you: ? Avoid foods that cause symptoms. Talk with your dietitian about other ways to get the same nutrients that are in those problem foods. ? Avoid foods with gluten. Gluten is a protein that is found in rye, wheat,  and barley. ? Eat more foods that contain soluble fiber. Examples of foods with high soluble fiber include oats, seeds, and certain fruits and vegetables. Take a fiber supplement if directed by your dietitian. ? Reduce or avoid certain  foods called FODMAPs. These are foods that contain carbohydrates that are hard to digest. Ask your doctor which foods contain these carbohydrates. What foods are not recommended? The following are some foods and drinks that may make your symptoms worse:  Fatty foods, such as french fries.  Foods that contain gluten, such as pasta and cereal.  Dairy products, such as milk, cheese, and ice cream.  Chocolate.  Alcohol.  Products with caffeine, such as coffee.  Carbonated drinks, such as soda.  Foods that are high in FODMAPs. These include certain fruits and vegetables.  Products with sweeteners such as honey, high fructose corn syrup, sorbitol, and mannitol. The items listed above may not be a complete list of foods and beverages you should avoid. Contact a dietitian for more information. What foods are good sources of fiber? Your health care provider or dietitian may recommend that you eat more foods that contain fiber. Fiber can help to reduce constipation and other IBS symptoms. Add foods with fiber to your diet a little at a time so your body can get used to them. Too much fiber at one time might cause gas and swelling of your abdomen. The following are some foods that are good sources of fiber:  Berries, such as raspberries, strawberries, and blueberries.  Tomatoes.  Carrots.  Brown rice.  Oats.  Seeds, such as chia and pumpkin seeds. The items listed above may not be a complete list of recommended sources of fiber. Contact your dietitian for more options. Where to find more information  International [employer] for Functional Gastrointestinal Disorders: www.iffgd.[employer] and [employer]estive [employer]ney [employer]:  [employer] Summary  When you have irritable bowel syndrome (IBS), it is very important to eat the foods and follow the eating habits that are best for your condition.  IBS may cause various symptoms such as pain in the abdomen, constipation, or diarrhea.  Choosing the right foods can help to ease the discomfort that comes from symptoms.  Keep a food diary. This will help you identify foods that cause symptoms.  Your health care provider or diet and nutrition specialist (dietitian) may recommend that you eat more foods that contain fiber.

## 2019-12-29 ENCOUNTER — Ambulatory Visit (INDEPENDENT_AMBULATORY_CARE_PROVIDER_SITE_OTHER): Payer: PPO | Admitting: Internal Medicine

## 2019-12-29 ENCOUNTER — Encounter: Payer: Self-pay | Admitting: Internal Medicine

## 2019-12-29 ENCOUNTER — Other Ambulatory Visit: Payer: Self-pay

## 2019-12-29 VITALS — BP 126/72 | HR 64 | Temp 97.5°F | Resp 16 | Ht 63.5 in | Wt 96.6 lb

## 2019-12-29 DIAGNOSIS — K589 Irritable bowel syndrome without diarrhea: Secondary | ICD-10-CM

## 2019-12-29 DIAGNOSIS — R634 Abnormal weight loss: Secondary | ICD-10-CM | POA: Diagnosis not present

## 2019-12-29 DIAGNOSIS — K219 Gastro-esophageal reflux disease without esophagitis: Secondary | ICD-10-CM | POA: Diagnosis not present

## 2019-12-29 MED ORDER — MIRTAZAPINE 15 MG PO TABS: ORAL_TABLET | ORAL | 1 refills | Status: DC

## 2020-01-01 ENCOUNTER — Other Ambulatory Visit: Payer: Self-pay | Admitting: Internal Medicine

## 2020-01-01 DIAGNOSIS — I10 Essential (primary) hypertension: Secondary | ICD-10-CM

## 2020-01-07 ENCOUNTER — Ambulatory Visit: Payer: PPO | Admitting: Cardiology

## 2020-01-07 ENCOUNTER — Encounter: Payer: Self-pay | Admitting: Cardiology

## 2020-01-07 ENCOUNTER — Other Ambulatory Visit: Payer: Self-pay

## 2020-01-07 ENCOUNTER — Telehealth: Payer: Self-pay | Admitting: *Deleted

## 2020-01-07 VITALS — BP 124/80 | HR 73 | Ht 63.5 in | Wt 98.6 lb

## 2020-01-07 DIAGNOSIS — Z8673 Personal history of transient ischemic attack (TIA), and cerebral infarction without residual deficits: Secondary | ICD-10-CM

## 2020-01-07 DIAGNOSIS — R1319 Other dysphagia: Secondary | ICD-10-CM

## 2020-01-07 DIAGNOSIS — I4891 Unspecified atrial fibrillation: Secondary | ICD-10-CM | POA: Diagnosis not present

## 2020-01-07 DIAGNOSIS — R1310 Dysphagia, unspecified: Secondary | ICD-10-CM | POA: Diagnosis not present

## 2020-01-07 DIAGNOSIS — R1013 Epigastric pain: Secondary | ICD-10-CM

## 2020-01-07 MED ORDER — APIXABAN 2.5 MG PO TABS: 2.5000 mg | ORAL_TABLET | Freq: Two times a day (BID) | ORAL | 6 refills | Status: DC

## 2020-01-07 NOTE — Telephone Encounter (Signed)
Patient called and was upset and confused about the change in her Eliquis dose from 5 mg 1/2 tablet twice and new RX for 2.5 mg tablets twice a day. Patient has 5 mg tablets and will continue 1/2 tablet twice a day until she uses them. She was concerned about the cost of the smaller dose being more expensive since she will need to refill more frequently.

## 2020-01-07 NOTE — Progress Notes (Signed)
Cardiology Office Note:    Date:  01/07/2020   ID:  Debra White, DOB 1933/07/03, MRN [id number]  PCP:  Unk Pinto, MD  Cardiologist:  Skeet Latch, MD  Electrophysiologist:  None   Referring MD: Unk Pinto, MD   No chief complaint on file. epigastric discomfort with eating  History of Present Illness:    Debra White is a 86 y.o. female with a hx of chronic atrial fibrillation on chronic anticoagulation, prior CVA, hypertension, COPD, and anxiety,  She weighs less than 100 lbs. I saw her on 10/09/2019 and her HR was 44.  I suggested stopping her Lanoxin, and decreasing her Atenolol but she was reluctant as she does not tolerate palpitations.  Two days later she was admitted with syncope.  She was kept in the hospital till 10/12/2019 and discharged on Atenolol 50 mg daily.  The patient was seen 11/19/2019 by me.  A 7 day ZIO was done.  This revealed no significant bradycardia.  The patient has not had recurrent syncope.  She was referred to Dr Fuller Plan for GI evaluation. F/U with Dr Fuller Plan was cancelled- not sure why.  She continues to complain of epigastric discomfort after eating.  She has managed to gain 2 lbs to a weight of 98 lbs.    Past Medical History:  Diagnosis Date  . Acute on chronic combined systolic and diastolic CHF (congestive heart failure) ([place]) 06/29/2019  . Anxiety   . Arthritis   . Blood transfusion   . C. difficile colitis   . Candida esophagitis ([place]) 2013   EGD  . Cataract   . Chronic kidney disease    STONES  . Diverticulitis 04/05/2017  . Diverticulosis of colon (without mention of hemorrhage) 2007   Colonoscopy  . Dysrhythmia    RX  . Emphysema of lung ([place])   . Family history of colon cancer    sister  . Fibromyalgia   . Fracture, zygoma closed ([place]) 07/13/2011  . GERD (gastroesophageal reflux disease)   . Headache(784.0)   . Hyperlipemia   . Hypertension   . Kidney stones   . Ptosis, bilateral   . Renal infarct ([place])    . Splenic infarct     Past Surgical History:  Procedure Laterality Date  . ABDOMINAL HYSTERECTOMY    . BACK SURGERY     X2  . BACK SURGERY    . BREAST EXCISIONAL BIOPSY    . CERVICAL DISC SURGERY    . COLONOSCOPY    . FACIAL FRACTURE SURGERY    . HAND SURGERY     BIL   . ORIF TRIPOD FRACTURE  07/13/2011   Procedure: OPEN REDUCTION INTERNAL FIXATION (ORIF) TRIPOD FRACTURE;  Surgeon: Tyson Alias, MD;  Location: [facility];  Service: ENT;  Laterality: Right;  ORIF RIGHT ZYGOMA, ORBITAL FLOOR EXPLORATION WITH FROST STITCH (TEMPORARY TARSORRHAPHY)  . PTOSIS REPAIR Bilateral 11/22/2016   Procedure: INTERNAL PTOSIS REPAIR;  Surgeon: Clista Bernhardt, MD;  Location: [facility];  Service: Ophthalmology;  Laterality: Bilateral;  . SHOULDER ARTHROSCOPY W/ ROTATOR CUFF REPAIR     LFT    Current Medications: Current Meds  Medication Sig  . acetaminophen (TYLENOL) 500 MG tablet Take 500-1,000 mg by mouth See admin instructions. Take 1 tablet (500 mg) by mouth every morning, take 2 tablets (1000 mg) with lunch and 1 tablet (500 mg) at night  . apixaban (ELIQUIS) 5 MG TABS tablet Take 1 tablet 2 x  /day to Prevent Blood Clots  .  atenolol (TENORMIN) 100 MG tablet Take 0.5 tablets (50 mg total) by mouth daily.  . Cholecalciferol (VITAMIN D-3) 125 MCG (5000 UT) TABS Take 5,000 Units by mouth daily.  . clonazePAM (KLONOPIN) 1 MG tablet Take 1/2 to 1 tablet 3 x / day at Meals for Anxiety / Nerves  . conjugated estrogens (PREMARIN) vaginal cream Place 1 Applicatorful vaginally See admin instructions. Apply one applicatorful vaginally twice weekly - every Wednesday and Saturday night  . diclofenac Sodium (VOLTAREN) 1 % GEL Apply 2 grams 3 to 4 x / day as needed for Pain (Patient taking differently: Apply 2 g topically 2 (two) times daily as needed (pain). )  . dicyclomine (BENTYL) 20 MG tablet Take  1 tablet  4 x /day with  Meals & Bedtime for Cramping, Nausea  or Bloating  . gabapentin (NEURONTIN) 600 MG  tablet TAKE 1/2 TO 1 TABLET BY MOUTH 3 TIMES DAILY FOR FACIAL PAIN  . lovastatin (MEVACOR) 20 MG tablet Take 1 tablet at Bedtime for Cholesterol  . mirtazapine (REMERON) 15 MG tablet Take 1 tablet at Bedtime for Appetite  . olmesartan (BENICAR) 40 MG tablet Take 1 tablet Daily for BP  . omeprazole (PRILOSEC) 40 MG capsule Take 1 capsule Daily for Indigestion & Heartburn (Patient taking differently: Take 40 mg by mouth daily before breakfast. for Indigestion & Heartburn)  . polyvinyl alcohol (ARTIFICIAL TEARS) 1.4 % ophthalmic solution Place 1 drop into both eyes 2 (two) times daily as needed for dry eyes.     Allergies:   Latex, Amoxicillin, Aspirin, Barbiturates, Celebrex [celecoxib], Codeine, Evista [raloxifene], Lipitor [atorvastatin], Morphine and related, Oruvail [ketoprofen], Pantoprazole, Paraffin, Prochlorperazine, Proloprim [trimethoprim], Venlafaxine, Vibra-tab [doxycycline], Vioxx [rofecoxib], Betadine [povidone iodine], and Caffeine   Social History   Socioeconomic History  . Marital status: Married    Spouse name: Not on file  . Number of children: 1  . Years of education: 32  . Highest education level: Not on file  Occupational History  . Occupation: Retired    Fish farm manager: RETIRED  Tobacco Use  . Smoking status: Never Smoker  . Smokeless tobacco: Never Used  Vaping Use  . Vaping Use: Never used  Substance and Sexual Activity  . Alcohol use: No  . Drug use: No  . Sexual activity: Not on file    Comment: HYSTER  Other Topics Concern  . Not on file  Social History Narrative   [place]   Right-handed.   No caffeine use.   Social Determinants of Health   Financial Resource Strain:   . Difficulty of Paying Living Expenses:   Food Insecurity:   . Worried About Charity fundraiser in the Last Year:   . Arboriculturist in the Last Year:   Transportation Needs:   . Film/video editor (Medical):   Marland Kitchen Lack of Transportation  (Non-Medical):   Physical Activity:   . Days of Exercise per Week:   . Minutes of Exercise per Session:   Stress:   . Feeling of Stress :   Social Connections:   . Frequency of Communication with Friends and Family:   . Frequency of Social Gatherings with Friends and Family:   . Attends Religious Services:   . Active Member of Clubs or Organizations:   . Attends Archivist Meetings:   Marland Kitchen Marital Status:      Family History: The patient's family history includes Breast cancer (age of onset: 41) in her sister; Cancer in  her sister, sister, and sister; Colon cancer in her sister; Heart attack in her father; Heart disease in her father, mother, and sister; Hyperlipidemia in her daughter; Hypertension in her daughter.  ROS:   Please see the history of present illness.     All other systems reviewed and are negative.  EKGs/Labs/Other Studies Reviewed:    The following studies were reviewed today: Echo 10/11/2019- IMPRESSIONS    1. Left ventricular ejection fraction, by estimation, is 55 to 60%. The  left ventricle has normal function. The left ventricle has no regional  wall motion abnormalities. Left ventricular diastolic function could not  be evaluated.  2. Right ventricular systolic function is normal. The right ventricular  size is normal. There is normal pulmonary artery systolic pressure. The  estimated right ventricular systolic pressure is 61.4 mmHg.  3. Left atrial size was severely dilated.  4. Right atrial size was severely dilated.  5. The mitral valve is grossly normal. Mild mitral valve regurgitation.  No evidence of mitral stenosis.  6. Tricuspid valve regurgitation is moderate.  7. The aortic valve is tricuspid. Aortic valve regurgitation is not  visualized. Mild aortic valve sclerosis is present, with no evidence of  aortic valve stenosis.  8. The inferior vena cava is normal in size with greater than 50%  respiratory variability, suggesting  right atrial pressure of 3 mmHg.   EKG:  EKG is not ordered today.  The ekg ordered 11/19/2019 demonstrates AF with VR 65  Recent Labs: 12/15/2019: ALT 24; BUN 12; Creat 0.87; Hemoglobin 11.6; Magnesium 1.8; Platelets 205; Potassium 4.7; Sodium 136; TSH 0.95  Recent Lipid Panel    Component Value Date/Time   CHOL 156 12/15/2019 0838   TRIG 110 12/15/2019 0838   HDL 90 12/15/2019 0838   CHOLHDL 1.7 12/15/2019 0838   VLDL 31 (H) 02/05/2017 1149   LDLCALC 46 12/15/2019 0838    Physical Exam:    VS:  BP 124/80   Pulse 73   Ht 5' 3.5" (1.613 m)   Wt 98 lb 9.6 oz (44.7 kg)   BMI 17.19 kg/m     Wt Readings from Last 3 Encounters:  01/07/20 98 lb 9.6 oz (44.7 kg)  12/29/19 96 lb 9.6 oz (43.8 kg)  12/15/19 99 lb (44.9 kg)     GEN: Cachectic elderly female, in no acute distress HEENT: Normal NECK: No JVD; No carotid bruits CARDIAC: irregularly irregular,  no murmurs, rubs, gallops RESPIRATORY:  Clear to auscultation without rales, wheezing or rhonchi  ABDOMEN: Soft, non-tender, non-distended MUSCULOSKELETAL:  No edema; No deformity  SKIN: Warm and dry NEUROLOGIC:  Alert and oriented x 3 PSYCHIATRIC:  Normal affect   ASSESSMENT:    H/O Syncope Suspect this was secondary to transient bradycardia- she was reluctant to decrease her medications 10/09/2019. She seems to be doing well now off Lanoxin.  Atrial fibrillation with controlled ventricular response ([place]) She should be on elliquis 2.5mg  BID due to age > 54 and weight less than 60 kg. MAR says 5 mg BID- will confirm she is on the right dose.  Epigastric discomfort Dr Fuller Plan following- abdominal US done 11/06/2019  History of embolic stroke Old Rt brain stroke on MRI and history of splenic and renal infarcts in the past  Anxiety On Klonopin BID   Plan:  Same cardiac Rx.  I suggested she f/u with Dr Fuller Plan- there was mention of possible endoscopy.   PLAN:    Same cardiac Rx.  I suggested she f/u with  Dr Fuller Plan-  there was mention of possible endoscopy.   Medication Adjustments/Labs and Tests Ordered: Current medicines are reviewed at length with the patient today.  Concerns regarding medicines are outlined above.  No orders of the defined types were placed in this encounter.  No orders of the defined types were placed in this encounter.   There are no Patient Instructions on file for this visit.   Debra Form, PA-C  01/07/2020 10:41 AM    [place]

## 2020-01-07 NOTE — Patient Instructions (Signed)
Medication Instructions:  DECREASE ELIQUIS 2.5MG  DAILY *If you need a refill on your cardiac medications before your next appointment, please call your pharmacy*  Special Instructions FOLLOW UP WITH DR Zella Ball WILL CALL THEM AND LET THEM KNOW YOU NEED AN APPOINTMENT  Follow-Up: Your next appointment:  6 month(s) Please call our office 2 months in advance to schedule this appointment In Person with Skeet Latch, MD  At [facility], you and your health needs are our priority.  As part of our continuing mission to provide you with exceptional heart care, we have created designated Provider Care Teams.  These Care Teams include your primary Cardiologist (physician) and Advanced Practice Providers (APPs -  Physician Assistants and Nurse Practitioners) who all work together to provide you with the care you need, when you need it.  We recommend signing up for the patient portal called "MyChart".  Sign up information is provided on this After Visit Summary.  MyChart is used to connect with patients for Virtual Visits (Telemedicine).  Patients are able to view lab/test results, encounter notes, upcoming appointments, etc.  Non-urgent messages can be sent to your provider as well.   To learn more about what you can do with MyChart, go to [email or web address].

## 2020-01-08 ENCOUNTER — Telehealth: Payer: Self-pay | Admitting: Cardiology

## 2020-01-08 NOTE — Telephone Encounter (Signed)
    Pt is calling about referral for GI doctor. Advise Kerin Ransom sent referral and she needs to call them for appt. pt understood

## 2020-01-11 ENCOUNTER — Other Ambulatory Visit: Payer: Self-pay | Admitting: Internal Medicine

## 2020-01-11 NOTE — Addendum Note (Signed)
Addended by: Unk Pinto on: 01/11/2020 02:27 PM   Modules accepted: Orders

## 2020-01-27 DIAGNOSIS — N952 Postmenopausal atrophic vaginitis: Secondary | ICD-10-CM | POA: Diagnosis not present

## 2020-01-27 DIAGNOSIS — R350 Frequency of micturition: Secondary | ICD-10-CM | POA: Diagnosis not present

## 2020-01-27 DIAGNOSIS — Z87440 Personal history of urinary (tract) infections: Secondary | ICD-10-CM | POA: Diagnosis not present

## 2020-02-02 NOTE — Progress Notes (Signed)
History of Present Illness:      This delightful 86 yo MWF with  HTN, ASHD / chAfib,  HLD, GERD, IBS, Prediabetes  and Vitamin D Deficiency returns for 1 month f/u after adding Remeron for concerns of unintentional weight loss. Patient has regained 7# over the last month.       Patient has GERD & IBS and she reports improvement in her sx's of EG discomfort & bloating since restarting Omeprazole / Dicyclomine as Dr Fuller Plan had recommended. She denies any current sx's of N/V or Abd pains or cramping.   Wt Readings from Last 3 Encounters:  02/03/20 103 lb 3.2 oz (46.8 kg)  01/07/20 98 lb 9.6 oz (44.7 kg)  12/29/19 96 lb 9.6 oz (43.8 kg)   Medications  .  atenolol  100 MG tablet, Take 0.5 tablets  daily. Marland Kitchen  olmesartan   40 MG tablet, Take 1 tablet Daily for BP .  acetaminophen  500 MG tablet,  Take 1 tab- morning, take 2 tabs  lunch and 1 tablet at night .  apixaban (ELIQUIS) 2.5 MG TABS tablet, Take 1 tablet  2 (two) times daily. Prevent Blood Clots .  VITAMIN D, Take 5,000 Units  daily. .  clonazePAM (KLONOPIN) 1 MG tablet, Take 1/2 to 1 tablet 3 x / day at Meals for Anxiety / Nerves .  PREMARIN vaginal cream, Place 1 Applicatorful vaginally  twice weekly .  Diclofenac 1 % GEL, Apply 2 grams 3 to 4 x / day as needed for Pain  .  dicyclomine 20 MG , Take  1 tablet  4 x /day with  Meals & Bedtime for Cramping, Nausea  or Bloating .  gabapentin  600 MG tablet, TAKE 1/2 TO 1 TABLET  3 TIMES DAILY FOR FACIAL PAIN .  mirtazapine (REMERON) 15 MG tablet, Take 1 tablet at Bedtime for Appetite .  omeprazole  40 MG capsule, Take 1 capsule Daily for Indigestion & Heartburn  .  ARTIFICIAL TEARs 1.4 % ophth soln, Place 1 drop into both eyes 2  times daily as needed   Problem list She has Headache, post-traumatic, chronic; Hypertension; Hyperlipidemia; GERD; Vitamin D deficiency; Atypical facial pain; Medication management; Encounter for Medicare annual wellness exam; Mild malnutrition ([place]); COPD  (chronic obstructive pulmonary disease) with emphysema ([place]); Tortuous aorta ([place]); Osteoporosis; Aortic atherosclerosis ([place]); Hepatic steatosis; Renal infarct ([place]); Chronic hyponatremia; Chronic hyperkalemia; Coronary atherosclerosis due to lipid rich plaque; Atrial fibrillation with controlled ventricular response ([place]); Diastolic CHF ([place]); History of embolic stroke; Syncope; Epigastric discomfort; and Anxiety on their problem list.   Observations/Objective:  BP 126/80   Pulse 72   Temp (!) 97 F (36.1 C)   Resp 16   Ht 5' 3.5" (1.613 m)   Wt 103 lb 3.2 oz (46.8 kg)   BMI 17.99 kg/m   HEENT - WNL. Neck - supple.  Chest - Clear equal BS. Cor - Nl HS. RRR w/o sig MGR. PP 1(+). No edema. MS- FROM w/o deformities.  Gait Nl. Neuro -  Nl w/o focal abnormalities  Assessment and Plan:  1. Weight loss   2. Irritable bowel syndrome, unspecified type   3. Gastroesophageal reflux disease, unspecified whether esophagitis present  Follow Up Instructions:      I discussed the assessment and treatment plan with the patient. The patient was provided an opportunity to ask questions and all were answered. The patient agreed with the plan and demonstrated an understanding of the instructions.    Gwyndolyn Saxon  Melton Krebs, MD

## 2020-02-03 ENCOUNTER — Encounter: Payer: Self-pay | Admitting: Internal Medicine

## 2020-02-03 ENCOUNTER — Other Ambulatory Visit: Payer: Self-pay

## 2020-02-03 ENCOUNTER — Ambulatory Visit (INDEPENDENT_AMBULATORY_CARE_PROVIDER_SITE_OTHER): Payer: PPO | Admitting: Internal Medicine

## 2020-02-03 VITALS — BP 126/80 | HR 72 | Temp 97.0°F | Resp 16 | Ht 63.5 in | Wt 103.2 lb

## 2020-02-03 DIAGNOSIS — K219 Gastro-esophageal reflux disease without esophagitis: Secondary | ICD-10-CM | POA: Diagnosis not present

## 2020-02-03 DIAGNOSIS — K589 Irritable bowel syndrome without diarrhea: Secondary | ICD-10-CM | POA: Diagnosis not present

## 2020-02-03 DIAGNOSIS — R634 Abnormal weight loss: Secondary | ICD-10-CM | POA: Diagnosis not present

## 2020-02-18 ENCOUNTER — Other Ambulatory Visit: Payer: Self-pay | Admitting: Internal Medicine

## 2020-02-18 DIAGNOSIS — K2100 Gastro-esophageal reflux disease with esophagitis, without bleeding: Secondary | ICD-10-CM

## 2020-03-01 ENCOUNTER — Other Ambulatory Visit: Payer: Self-pay | Admitting: Internal Medicine

## 2020-03-03 ENCOUNTER — Encounter: Payer: Self-pay | Admitting: Gastroenterology

## 2020-03-03 ENCOUNTER — Ambulatory Visit: Payer: PPO | Admitting: Gastroenterology

## 2020-03-03 VITALS — BP 120/68 | HR 53 | Ht 64.0 in | Wt 108.6 lb

## 2020-03-03 DIAGNOSIS — K219 Gastro-esophageal reflux disease without esophagitis: Secondary | ICD-10-CM

## 2020-03-03 DIAGNOSIS — R1310 Dysphagia, unspecified: Secondary | ICD-10-CM | POA: Diagnosis not present

## 2020-03-03 DIAGNOSIS — R0781 Pleurodynia: Secondary | ICD-10-CM

## 2020-03-03 NOTE — Patient Instructions (Signed)
If you are age 84 or older, your body mass index should be between 23-30. Your Body mass index is 18.64 kg/m. If this is out of the aforementioned range listed, please consider follow up with your Primary Care Provider.  If you are age 73 or younger, your body mass index should be between 19-25. Your Body mass index is 18.64 kg/m. If this is out of the aformentioned range listed, please consider follow up with your Primary Care Provider.   You have been scheduled for a Barium Esophogram at [facility] (1st floor of the hospital) on 03/15/20 at 9:30 am. Please arrive 15 minutes prior to your appointment for registration. Make certain not to have anything to eat or drink 3 hours prior to your test. If you need to reschedule for any reason, please contact radiology at [phone] to do so. __________________________________________________________________ A barium swallow is an examination that concentrates on views of the esophagus. This tends to be a double contrast exam (barium and two liquids which, when combined, create a gas to distend the wall of the oesophagus) or single contrast (non-ionic iodine based). The study is usually tailored to your symptoms so a good history is essential. Attention is paid during the study to the form, structure and configuration of the esophagus, looking for functional disorders (such as aspiration, dysphagia, achalasia, motility and reflux) EXAMINATION You may be asked to change into a gown, depending on the type of swallow being performed. A radiologist and radiographer will perform the procedure. The radiologist will advise you of the type of contrast selected for your procedure and direct you during the exam. You will be asked to stand, sit or lie in several different positions and to hold a small amount of fluid in your mouth before being asked to swallow while the imaging is performed .In some instances you may be asked to swallow barium coated  marshmallows to assess the motility of a solid food bolus. The exam can be recorded as a digital or video fluoroscopy procedure. POST PROCEDURE It will take 1-2 days for the barium to pass through your system. To facilitate this, it is important, unless otherwise directed, to increase your fluids for the next 24-48hrs and to resume your normal diet.  This test typically takes about 30 minutes to perform. __________________________________________________________________________________  Thank you for choosing me and [facility].  Pricilla Riffle. Dagoberto Ligas., MD., [facility]

## 2020-03-03 NOTE — Progress Notes (Addendum)
    History of Present Illness: This is an 86 year old female with complaints of intermittent difficulty swallowing solids and pills and low sternal/epigastric pain.  She is accompanied by her sister.  She is taking omeprazole and dicyclomine as recommended for treatment of GERD and IBS. These medications appear to be effective.  She had a unexplained weight loss and was placed on Remeron and states at this point she has gained 12 pounds.  Her appetite is good.  She notes intermittent difficulties swallowing bread and certain pills.  Symptoms appear to be new over the past 2 to 3 months.  She also notes lower sternal and right lower chest pain that increases when she bends over.  Current Medications, Allergies, Past Medical History, Past Surgical History, Family History and Social History were reviewed in [employer] record.   Physical Exam: General: Well developed, well nourished, thin, frail, no acute distress Head: Normocephalic and atraumatic Eyes:  sclerae anicteric, EOMI Ears: Normal auditory acuity Mouth: Not examined, mask on during Covid-19 pandemic Lungs: Clear throughout to auscultation Chest: tender over lower right sternum and lower anterior right ribs Heart: Regular rate and rhythm; no murmurs, rubs or bruits Abdomen: Soft, non tender and non distended. No masses, hepatosplenomegaly or hernias noted. Normal Bowel sounds Rectal: Not done Musculoskeletal: Symmetrical with no gross deformities  Pulses:  Normal pulses noted Extremities: No clubbing, cyanosis, edema or deformities noted Neurological: Alert oriented x 4, grossly nonfocal Psychological:  Alert and cooperative. Normal mood and affect   Assessment and Recommendations:  1. GERD. Intermittent solid food and pill dysphagia.  Rule out stricture or motility disorder.  Continue omeprazole 40 mg p.o. every morning.  Follow antireflux measures.  Schedule barium esophagram.  Pending esophagram findings  may need EGD for further evaluation, possible dilation which would require a hold of Eliquis.  2. IBS.  Symptoms under good control.  Continue dicyclomine 20 mg po ac and hs.   3. Weight loss. Etiology unclear and she has gained 12 lbs on Remeron.  Follow-up with PCP.  4. Lower sternal, right rib tenderness. Tylenol prn. Follow up with PCP.  5. Chronic anticoagulation with Eliquis.

## 2020-03-15 ENCOUNTER — Ambulatory Visit (HOSPITAL_COMMUNITY)
Admission: RE | Admit: 2020-03-15 | Discharge: 2020-03-15 | Disposition: A | Discharge status: Home / Self Care | Payer: PPO | Source: Ambulatory Visit | Attending: Gastroenterology | Admitting: Gastroenterology

## 2020-03-15 ENCOUNTER — Other Ambulatory Visit: Payer: Self-pay

## 2020-03-15 DIAGNOSIS — R1310 Dysphagia, unspecified: Secondary | ICD-10-CM | POA: Insufficient documentation

## 2020-03-17 ENCOUNTER — Telehealth: Payer: Self-pay | Admitting: Gastroenterology

## 2020-03-17 NOTE — Telephone Encounter (Signed)
See result notes. 

## 2020-03-21 NOTE — Progress Notes (Signed)
FOLLOW UP  Assessment and Plan:    Continue diet and meds as discussed. Further disposition pending results of labs. Over 30 minutes of exam, counseling, chart review, and critical decision making was performed  Future Appointments  Date Time Provider [place]  06/23/2020 10:30 AM Garnet Sierras, NP [facility] None  07/05/2020 10:00 AM Skeet Latch, MD [facility]  09/15/2020 10:30 AM Garnet Sierras, NP [facility] None  12/15/2020 10:00 AM Unk Pinto, MD [facility] None     HPI 86 y.o. female  presents for 3 month follow up on hypertension, cholesterol, prediabetes, and vitamin D deficiency.   She had splenic and renal infarcts in 2018, history of CVA and on Eliquis for Afib.  Echo 2019 EF 55-60% with severe LAE.  Declines TEE and loop recorder. Has seen Dr. Oval Linsey.   Husband has dementia, he is doing PT and working on strength.   She still has a good appetite but she is stressed from having to take care of her husband, they will have been married for 65 years this year.   No coughing, no wheezing, some SOB with carrying groceries in. Follows with cardiologist soon for abnormal CT/CXR.  BP Readings from Last 3 Encounters:  03/23/20 120/74  03/03/20 120/68  02/03/20 126/80   BMI is Body mass index is 18.37 kg/m., she is working on diet and exercise. She has followed up with Dr. Fuller Plan, had normal barium swallow, started on remeron for weight loss and is on ensure/boost with her weight increasing last visit.  Wt Readings from Last 3 Encounters:  03/23/20 107 lb (48.5 kg)  03/03/20 108 lb 9.6 oz (49.3 kg)  02/03/20 103 lb 3.2 oz (46.8 kg)    She  is  on cholesterol medication, has been off her chol medication due to weight loss, myalgias and age. Her cholesterol is at goal. The cholesterol last visit was:   Lab Results  Component Value Date   CHOL 156 12/15/2019   HDL 90 12/15/2019   LDLCALC 46 12/15/2019   TRIG 110 12/15/2019   CHOLHDL 1.7  12/15/2019    She has been working on diet and exercise for prediabetes, and denies paresthesia of the feet, polydipsia and polyuria. Last A1C in the office was:  Lab Results  Component Value Date   HGBA1C 5.7 (H) 12/15/2019    Patient is on Vitamin D supplement.   Lab Results  Component Value Date   VD25OH 93 12/15/2019       Current Medications:    Current Outpatient Medications (Cardiovascular):    atenolol (TENORMIN) 100 MG tablet, Take 1 tablet     Daily     for BP   lovastatin (MEVACOR) 20 MG tablet, Take 1 tablet at Bedtime for Cholesterol   olmesartan (BENICAR) 40 MG tablet, Take 1 tablet Daily for BP   Current Outpatient Medications (Analgesics):    acetaminophen (TYLENOL) 500 MG tablet, Take 500-1,000 mg by mouth as needed for headache.   Current Outpatient Medications (Hematological):    apixaban (ELIQUIS) 2.5 MG TABS tablet, Take 1 tablet (2.5 mg total) by mouth 2 (two) times daily. Prevent Blood Clots  Current Outpatient Medications (Other):    Cholecalciferol (VITAMIN D-3) 125 MCG (5000 UT) TABS, Take 5,000 Units by mouth daily.   clonazePAM (KLONOPIN) 1 MG tablet, Take 1/2 to 1 tablet 3 x / day at Meals for Anxiety / Nerves   conjugated estrogens (PREMARIN) vaginal cream, Place 1 Applicatorful vaginally See admin instructions. Apply one applicatorful vaginally twice  weekly - every Wednesday and Saturday night   diclofenac Sodium (VOLTAREN) 1 % GEL, Apply 2 grams 3 to 4 x / day as needed for Pain (Patient taking differently: Apply 2 g topically 2 (two) times daily as needed (pain). )   dicyclomine (BENTYL) 20 MG tablet, Take  1 tablet  4 x /day with  Meals & Bedtime for Cramping, Nausea  or Bloating   gabapentin (NEURONTIN) 600 MG tablet, TAKE 1/2 TO 1 TABLET BY MOUTH 3 TIMES DAILY FOR FACIAL PAIN   mirtazapine (REMERON) 15 MG tablet, Take 1 tablet at Bedtime for Appetite   omeprazole (PRILOSEC) 40 MG capsule, Take 1 capsule (40 mg total) by mouth  daily before breakfast. for Indigestion & Heartburn   polyvinyl alcohol (ARTIFICIAL TEARS) 1.4 % ophthalmic solution, Place 1 drop into both eyes 2 (two) times daily as needed for dry eyes.  Medical History:  Past Medical History:  Diagnosis Date   Acute on chronic combined systolic and diastolic CHF (congestive heart failure) ([place]) 06/29/2019   Anxiety    Arthritis    Blood transfusion    C. difficile colitis    Candida esophagitis ([place]) 2013   EGD   Cataract    Chronic kidney disease    STONES   Diverticulitis 04/05/2017   Diverticulosis of colon (without mention of hemorrhage) 2007   Colonoscopy   Dysrhythmia    RX   Emphysema of lung ([place])    Family history of colon cancer    sister   Fibromyalgia    Fracture, zygoma closed ([place]) 07/13/2011   GERD (gastroesophageal reflux disease)    Headache(784.0)    Hyperlipemia    Hypertension    Kidney stones    Ptosis, bilateral    Renal infarct (HCC)    Splenic infarct    Allergies:  Allergies  Allergen Reactions   Latex Hives    TONGUE HAD BLISTERS WHEN HAD DENTAL SURGERY   Amoxicillin Nausea And Vomiting   Aspirin Other (See Comments)    Gi upset   Barbiturates Other (See Comments)    Unknown reaction   Celebrex [Celecoxib] Other (See Comments)    Unknown reaction   Codeine Itching   Evista [Raloxifene] Other (See Comments)    Unknown reaction   Lipitor [Atorvastatin] Other (See Comments)    Unknown reaction   Morphine And Related Other (See Comments)    Unknown reaction   Oruvail [Ketoprofen] Other (See Comments)    Unknown reaction   Pantoprazole Diarrhea   Paraffin Other (See Comments)    Unknown reaction   Prochlorperazine Other (See Comments)    Uncontrolled shaking   Proloprim [Trimethoprim] Other (See Comments)    Unknown reaction   Venlafaxine Nausea Only   Vibra-Tab [Doxycycline] Other (See Comments)    Unknown reaction   Vioxx [Rofecoxib] Other (See  Comments)    edema   Betadine [Povidone Iodine] Rash   Caffeine Palpitations     Review of Systems:  ROS  Family history- Review and unchanged Social history- Review and unchanged Physical Exam: BP 120/74    Pulse 61    Temp 97.6 F (36.4 C)    Wt 107 lb (48.5 kg)    SpO2 98%    BMI 18.37 kg/m  Wt Readings from Last 3 Encounters:  03/23/20 107 lb (48.5 kg)  03/03/20 108 lb 9.6 oz (49.3 kg)  02/03/20 103 lb 3.2 oz (46.8 kg)   General appearance: alert, no distress, WD/WN, female HEENT: normocephalic, sclerae anicteric,  TMs pearly right, left cerumen impaction, nares patent, no discharge or erythema, pharynx normal Oral cavity: MMM, no lesions Neck: supple, no lymphadenopathy, no thyromegaly, no masses Heart: RRR, normal S1, S2, no murmurs Lungs: CTA bilaterally, no wheezes, rhonchi, or rales Abdomen: +bs, soft, diffuse tenderness, non distended, no masses, no hepatomegaly, no splenomegaly Musculoskeletal: nontender, no swelling, no obvious deformity Extremities: no edema, no cyanosis, no clubbing Pulses: 2+ symmetric, upper and lower extremities, normal cap refill Neurological: alert, oriented x 3, CN2-12 intact, strength normal upper extremities and lower extremities, sensation normal throughout, DTRs 2+ throughout, no cerebellar signs, gait normal Psychiatric: normal affect, behavior normal, pleasant    Vicie Mutters, PA-C 11:14 AM [facility]

## 2020-03-23 ENCOUNTER — Encounter: Payer: Self-pay | Admitting: Physician Assistant

## 2020-03-23 ENCOUNTER — Ambulatory Visit (INDEPENDENT_AMBULATORY_CARE_PROVIDER_SITE_OTHER): Payer: PPO | Admitting: Physician Assistant

## 2020-03-23 ENCOUNTER — Other Ambulatory Visit: Payer: Self-pay

## 2020-03-23 VITALS — BP 120/74 | HR 61 | Temp 97.6°F | Wt 107.0 lb

## 2020-03-23 DIAGNOSIS — Z23 Encounter for immunization: Secondary | ICD-10-CM | POA: Diagnosis not present

## 2020-03-23 DIAGNOSIS — I4891 Unspecified atrial fibrillation: Secondary | ICD-10-CM | POA: Diagnosis not present

## 2020-03-23 DIAGNOSIS — D6859 Other primary thrombophilia: Secondary | ICD-10-CM | POA: Diagnosis not present

## 2020-03-23 DIAGNOSIS — E782 Mixed hyperlipidemia: Secondary | ICD-10-CM

## 2020-03-23 DIAGNOSIS — I10 Essential (primary) hypertension: Secondary | ICD-10-CM

## 2020-03-23 DIAGNOSIS — I700 Atherosclerosis of aorta: Secondary | ICD-10-CM | POA: Diagnosis not present

## 2020-03-23 DIAGNOSIS — R634 Abnormal weight loss: Secondary | ICD-10-CM | POA: Diagnosis not present

## 2020-03-23 DIAGNOSIS — R7309 Other abnormal glucose: Secondary | ICD-10-CM | POA: Diagnosis not present

## 2020-03-23 NOTE — Patient Instructions (Signed)
Generally hoarseness is either coming from above or from below- so we will treat this  To treat the nasal drip:  Can do a steroid nasal spary 1-2 sparys at night each nostril like nasonex. Remember to spray each nostril twice towards the outer part of your eye.  Do not sniff but instead pinch your nose and tilt your head back to help the medicine get into your sinuses.  The best time to do this is at bedtime. Stop if you get blurred vision or nose bleeds.   To treat the reflux Continue the prilosec  To stop irritation: Need to STOP the cough Do sugar free candy Do the tessalon drops VOICE REST is VERY important  If not better in 2-4 weeks will refer to ENT    .

## 2020-03-24 LAB — COMPLETE METABOLIC PANEL WITH GFR
AG Ratio: 1.6 (calc) (ref 1.0–2.5)
ALT: 13 U/L (ref 6–29)
AST: 18 U/L (ref 10–35)
Albumin: 4.4 g/dL (ref 3.6–5.1)
Alkaline phosphatase (APISO): 139 U/L (ref 37–153)
BUN: 16 mg/dL (ref 7–25)
CO2: 30 mmol/L (ref 20–32)
Calcium: 9.3 mg/dL (ref 8.6–10.4)
Chloride: 94 mmol/L — ABNORMAL LOW (ref 98–110)
Creat: 0.84 mg/dL (ref 0.60–0.88)
GFR, Est African American: 74 mL/min/{1.73_m2} (ref >=60)
GFR, Est Non African American: 64 mL/min/{1.73_m2} (ref >=60)
Globulin: 2.8 g/dL (calc) (ref 1.9–3.7)
Glucose, Bld: 92 mg/dL (ref 65–99)
Potassium: 4.5 mmol/L (ref 3.5–5.3)
Sodium: 134 mmol/L — ABNORMAL LOW (ref 135–146)
Total Bilirubin: 0.4 mg/dL (ref 0.2–1.2)
Total Protein: 7.2 g/dL (ref 6.1–8.1)

## 2020-03-24 LAB — HEMOGLOBIN A1C
Hgb A1c MFr Bld: 5.7 % of total Hgb — ABNORMAL HIGH (ref <5.7)
Mean Plasma Glucose: 117 (calc)
eAG (mmol/L): 6.5 (calc)

## 2020-03-24 LAB — CBC WITH DIFFERENTIAL/PLATELET
Absolute Monocytes: 795 cells/uL (ref 200–950)
Basophils Absolute: 38 cells/uL (ref 0–200)
Basophils Relative: 0.5 %
Eosinophils Absolute: 150 cells/uL (ref 15–500)
Eosinophils Relative: 2 %
HCT: 34.9 % — ABNORMAL LOW (ref 35.0–45.0)
Hemoglobin: 11.6 g/dL — ABNORMAL LOW (ref 11.7–15.5)
Lymphs Abs: 1688 cells/uL (ref 850–3900)
MCH: 30.1 pg (ref 27.0–33.0)
MCHC: 33.2 g/dL (ref 32.0–36.0)
MCV: 90.6 fL (ref 80.0–100.0)
MPV: 10.6 fL (ref 7.5–12.5)
Monocytes Relative: 10.6 %
Neutro Abs: 4830 cells/uL (ref 1500–7800)
Neutrophils Relative %: 64.4 %
Platelets: 240 10*3/uL (ref 140–400)
RBC: 3.85 10*6/uL (ref 3.80–5.10)
RDW: 13.1 % (ref 11.0–15.0)
Total Lymphocyte: 22.5 %
WBC: 7.5 10*3/uL (ref 3.8–10.8)

## 2020-03-24 LAB — LIPID PANEL
Cholesterol: 220 mg/dL — ABNORMAL HIGH (ref <200)
HDL: 91 mg/dL (ref >=50)
LDL Cholesterol (Calc): 110 mg/dL (calc) — ABNORMAL HIGH
Non-HDL Cholesterol (Calc): 129 mg/dL (calc) (ref <130)
Total CHOL/HDL Ratio: 2.4 (calc) (ref <5.0)
Triglycerides: 97 mg/dL (ref <150)

## 2020-03-24 LAB — TSH: TSH: 0.86 mIU/L (ref 0.40–4.50)

## 2020-03-28 ENCOUNTER — Other Ambulatory Visit: Payer: Self-pay | Admitting: Internal Medicine

## 2020-03-28 DIAGNOSIS — I10 Essential (primary) hypertension: Secondary | ICD-10-CM

## 2020-03-30 NOTE — Progress Notes (Signed)
Patient is aware of lab results and instructions. -e welch

## 2020-05-16 ENCOUNTER — Other Ambulatory Visit: Payer: Self-pay | Admitting: Internal Medicine

## 2020-05-18 ENCOUNTER — Other Ambulatory Visit: Payer: Self-pay | Admitting: Internal Medicine

## 2020-05-26 ENCOUNTER — Telehealth: Payer: Self-pay | Admitting: Internal Medicine

## 2020-05-26 NOTE — Telephone Encounter (Signed)
patient called, states someone called her from our number regarding a shot. She would like a call back please. could not understand message.

## 2020-05-27 ENCOUNTER — Emergency Department (HOSPITAL_COMMUNITY)
Admission: EM | Admit: 2020-05-27 | Discharge: 2020-05-27 | Disposition: A | Discharge status: Home / Self Care | Payer: PPO | Attending: Emergency Medicine | Admitting: Emergency Medicine

## 2020-05-27 ENCOUNTER — Telehealth: Payer: Self-pay | Admitting: Internal Medicine

## 2020-05-27 ENCOUNTER — Emergency Department (HOSPITAL_COMMUNITY): Payer: PPO

## 2020-05-27 ENCOUNTER — Encounter (HOSPITAL_COMMUNITY): Payer: Self-pay | Admitting: Radiology

## 2020-05-27 DIAGNOSIS — I130 Hypertensive heart and chronic kidney disease with heart failure and stage 1 through stage 4 chronic kidney disease, or unspecified chronic kidney disease: Secondary | ICD-10-CM | POA: Insufficient documentation

## 2020-05-27 DIAGNOSIS — R52 Pain, unspecified: Secondary | ICD-10-CM | POA: Diagnosis not present

## 2020-05-27 DIAGNOSIS — Z7902 Long term (current) use of antithrombotics/antiplatelets: Secondary | ICD-10-CM | POA: Insufficient documentation

## 2020-05-27 DIAGNOSIS — J449 Chronic obstructive pulmonary disease, unspecified: Secondary | ICD-10-CM | POA: Diagnosis not present

## 2020-05-27 DIAGNOSIS — Z79899 Other long term (current) drug therapy: Secondary | ICD-10-CM | POA: Diagnosis not present

## 2020-05-27 DIAGNOSIS — Z8719 Personal history of other diseases of the digestive system: Secondary | ICD-10-CM | POA: Insufficient documentation

## 2020-05-27 DIAGNOSIS — N189 Chronic kidney disease, unspecified: Secondary | ICD-10-CM | POA: Diagnosis not present

## 2020-05-27 DIAGNOSIS — Z9049 Acquired absence of other specified parts of digestive tract: Secondary | ICD-10-CM | POA: Diagnosis not present

## 2020-05-27 DIAGNOSIS — I10 Essential (primary) hypertension: Secondary | ICD-10-CM | POA: Diagnosis not present

## 2020-05-27 DIAGNOSIS — R58 Hemorrhage, not elsewhere classified: Secondary | ICD-10-CM | POA: Diagnosis not present

## 2020-05-27 DIAGNOSIS — R1084 Generalized abdominal pain: Secondary | ICD-10-CM | POA: Diagnosis not present

## 2020-05-27 DIAGNOSIS — Z7901 Long term (current) use of anticoagulants: Secondary | ICD-10-CM | POA: Insufficient documentation

## 2020-05-27 DIAGNOSIS — R109 Unspecified abdominal pain: Secondary | ICD-10-CM | POA: Diagnosis not present

## 2020-05-27 DIAGNOSIS — I5041 Acute combined systolic (congestive) and diastolic (congestive) heart failure: Secondary | ICD-10-CM | POA: Diagnosis not present

## 2020-05-27 DIAGNOSIS — Z91040 Latex allergy status: Secondary | ICD-10-CM | POA: Diagnosis not present

## 2020-05-27 DIAGNOSIS — K219 Gastro-esophageal reflux disease without esophagitis: Secondary | ICD-10-CM | POA: Insufficient documentation

## 2020-05-27 LAB — CBC WITH DIFFERENTIAL/PLATELET
Abs Immature Granulocytes: 0.05 10*3/uL (ref 0.00–0.07)
Basophils Absolute: 0 10*3/uL (ref 0.0–0.1)
Basophils Relative: 0 %
Eosinophils Absolute: 0 10*3/uL (ref 0.0–0.5)
Eosinophils Relative: 0 %
HCT: 34.2 % — ABNORMAL LOW (ref 36.0–46.0)
Hemoglobin: 11.3 g/dL — ABNORMAL LOW (ref 12.0–15.0)
Immature Granulocytes: 0 %
Lymphocytes Relative: 13 %
Lymphs Abs: 1.5 10*3/uL (ref 0.7–4.0)
MCH: 30.6 pg (ref 26.0–34.0)
MCHC: 33 g/dL (ref 30.0–36.0)
MCV: 92.7 fL (ref 80.0–100.0)
Monocytes Absolute: 1 10*3/uL (ref 0.1–1.0)
Monocytes Relative: 9 %
Neutro Abs: 8.7 10*3/uL — ABNORMAL HIGH (ref 1.7–7.7)
Neutrophils Relative %: 78 %
Platelets: 221 10*3/uL (ref 150–400)
RBC: 3.69 MIL/uL — ABNORMAL LOW (ref 3.87–5.11)
RDW: 12.7 % (ref 11.5–15.5)
WBC: 11.3 10*3/uL — ABNORMAL HIGH (ref 4.0–10.5)
nRBC: 0 % (ref 0.0–0.2)

## 2020-05-27 LAB — I-STAT CHEM 8, ED
BUN: 13 mg/dL (ref 8–23)
Calcium, Ion: 1.17 mmol/L (ref 1.15–1.40)
Chloride: 89 mmol/L — ABNORMAL LOW (ref 98–111)
Creatinine, Ser: 0.8 mg/dL (ref 0.44–1.00)
Glucose, Bld: 112 mg/dL — ABNORMAL HIGH (ref 70–99)
HCT: 37 % (ref 36.0–46.0)
Hemoglobin: 12.6 g/dL (ref 12.0–15.0)
Potassium: 4.5 mmol/L (ref 3.5–5.1)
Sodium: 128 mmol/L — ABNORMAL LOW (ref 135–145)
TCO2: 27 mmol/L (ref 22–32)

## 2020-05-27 LAB — URINALYSIS, ROUTINE W REFLEX MICROSCOPIC
Bacteria, UA: NONE SEEN
Bilirubin Urine: NEGATIVE
Glucose, UA: NEGATIVE mg/dL
Ketones, ur: NEGATIVE mg/dL
Leukocytes,Ua: NEGATIVE
Nitrite: NEGATIVE
Protein, ur: NEGATIVE mg/dL
Specific Gravity, Urine: 1.01 (ref 1.005–1.030)
pH: 8 (ref 5.0–8.0)

## 2020-05-27 LAB — COMPREHENSIVE METABOLIC PANEL
ALT: 20 U/L (ref 0–44)
AST: 22 U/L (ref 15–41)
Albumin: 4.1 g/dL (ref 3.5–5.0)
Alkaline Phosphatase: 102 U/L (ref 38–126)
Anion gap: 10 (ref 5–15)
BUN: 13 mg/dL (ref 8–23)
CO2: 28 mmol/L (ref 22–32)
Calcium: 8.9 mg/dL (ref 8.9–10.3)
Chloride: 90 mmol/L — ABNORMAL LOW (ref 98–111)
Creatinine, Ser: 0.8 mg/dL (ref 0.44–1.00)
GFR, Estimated: >60 mL/min (ref >60)
Glucose, Bld: 113 mg/dL — ABNORMAL HIGH (ref 70–99)
Potassium: 4.5 mmol/L (ref 3.5–5.1)
Sodium: 128 mmol/L — ABNORMAL LOW (ref 135–145)
Total Bilirubin: 0.7 mg/dL (ref 0.3–1.2)
Total Protein: 7.5 g/dL (ref 6.5–8.1)

## 2020-05-27 LAB — LIPASE, BLOOD: Lipase: 46 U/L (ref 11–51)

## 2020-05-27 MED ORDER — HYDROMORPHONE HCL 1 MG/ML IJ SOLN
0.5000 mg | Freq: Once | INTRAMUSCULAR | Status: AC
  Administered 2020-05-27: 0.5 mg via INTRAVENOUS
  Filled 2020-05-27: qty 1

## 2020-05-27 MED ORDER — ONDANSETRON HCL 4 MG/2ML IJ SOLN
4.0000 mg | Freq: Once | INTRAMUSCULAR | Status: AC
  Administered 2020-05-27: 4 mg via INTRAVENOUS
  Filled 2020-05-27: qty 2

## 2020-05-27 MED ORDER — IOHEXOL 300 MG/ML  SOLN
75.0000 mL | Freq: Once | INTRAMUSCULAR | Status: AC | PRN
  Administered 2020-05-27: 75 mL via INTRAVENOUS

## 2020-05-27 MED ORDER — SODIUM CHLORIDE (PF) 0.9 % IJ SOLN
INTRAMUSCULAR | Status: AC
  Filled 2020-05-27: qty 50

## 2020-05-27 MED ORDER — CLONAZEPAM 0.5 MG PO TABS
0.5000 mg | ORAL_TABLET | Freq: Once | ORAL | Status: AC
  Administered 2020-05-27: 0.5 mg via ORAL
  Filled 2020-05-27: qty 1

## 2020-05-27 NOTE — Discharge Instructions (Signed)
Take 8 scoops of miralax in 32oz of whatever you would like to drink.(Gatorade comes in this size) You can also use a fleets enema which you can buy over the counter at the pharmacy.  Return for worsening abdominal pain, vomiting or fever. ? ?

## 2020-05-27 NOTE — ED Triage Notes (Signed)
Presents per EMS with c/o rectal bleeding x 1 episode. Pt is on blood thinners and has hx of diverticulitis.

## 2020-05-27 NOTE — Telephone Encounter (Signed)
patient called with c/o left abdominal diffused pain into back, w/ blood in stool after bout of constipation and drinking some prune juice. Per Dr [name], go to [facility] for evaluation. Patient agrees to seek Urgent care. States she will call EMT for transport

## 2020-05-27 NOTE — ED Provider Notes (Signed)
[place] DEPT Provider Note   CSN: [id number] Arrival date & time: 05/27/20  1048     History Chief Complaint  Patient presents with  . Rectal Bleeding    Debra White is a 86 y.o. female.  86 yo F with a chief complaint of abdominal pain.  Worse to the left lower side but diffusely about the lower abdomen.  Going on for about a week now.  Worsening over the past couple days.  Now having some blood when she wipes with toilet paper.  Has had some nausea but no vomiting.  Was constipated for a few days and took some laxatives.  Prior hysterectomy.  Denies other abdominal surgery.  History of diverticulitis and thinks this feels somewhat similar.  The history is provided by the patient.  Rectal Bleeding Quality:  Bright red Amount:  Moderate Duration:  2 days Associated symptoms: abdominal pain   Associated symptoms: no dizziness, no fever and no vomiting        Past Medical History:  Diagnosis Date  . Acute on chronic combined systolic and diastolic CHF (congestive heart failure) ([place]) 06/29/2019  . Anxiety   . Arthritis   . Blood transfusion   . C. difficile colitis   . Candida esophagitis ([place]) 2013   EGD  . Cataract   . Chronic kidney disease    STONES  . Diverticulitis 04/05/2017  . Diverticulosis of colon (without mention of hemorrhage) 2007   Colonoscopy  . Dysrhythmia    RX  . Emphysema of lung ([place])   . Family history of colon cancer    sister  . Fibromyalgia   . Fracture, zygoma closed ([place]) 07/13/2011  . GERD (gastroesophageal reflux disease)   . Headache(784.0)   . Hyperlipemia   . Hypertension   . Kidney stones   . Ptosis, bilateral   . Renal infarct ([place])   . Splenic infarct     Patient Active Problem List   Diagnosis Date Noted  . Epigastric discomfort 11/19/2019  . Anxiety 11/19/2019  . Syncope 10/10/2019  . Diastolic CHF ([place]) 07/03/3233  . History of embolic stroke 57/32/2025  . Atrial fibrillation  with controlled ventricular response ([place]) 03/12/2019  . Coronary atherosclerosis due to lipid rich plaque 03/26/2018  . Renal infarct ([place]) 03/22/2018  . Chronic hyponatremia 03/22/2018  . Chronic hyperkalemia 03/22/2018  . Hepatic steatosis 11/17/2017  . Aortic atherosclerosis ([place]) 08/08/2017  . 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 Osteoporosis 12/21/2015  . Mild malnutrition ([place]) 05/11/2015  . Encounter for Medicare annual wellness exam 04/08/2015  . Medication management 01/27/2014  . 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 Vitamin D deficiency 07/08/2013  . Hypertension   . Hyperlipidemia   . GERD   . Headache, post-traumatic, chronic 09/20/2011    Past Surgical History:  Procedure Laterality Date  . ABDOMINAL HYSTERECTOMY    . BACK SURGERY     X2  . BACK SURGERY    . BREAST EXCISIONAL BIOPSY    . CERVICAL DISC SURGERY    . COLONOSCOPY    . FACIAL FRACTURE SURGERY    . HAND SURGERY     BIL   . ORIF TRIPOD FRACTURE  07/13/2011   Procedure: OPEN REDUCTION INTERNAL FIXATION (ORIF) TRIPOD FRACTURE;  Surgeon: Tyson Alias, MD;  Location: [facility];  Service: ENT;  Laterality: Right;  ORIF RIGHT ZYGOMA, ORBITAL FLOOR EXPLORATION WITH FROST STITCH (TEMPORARY TARSORRHAPHY)  .  PTOSIS REPAIR Bilateral 11/22/2016   Procedure: INTERNAL PTOSIS REPAIR;  Surgeon: Clista Bernhardt, MD;  Location: [place];  Service: Ophthalmology;  Laterality: Bilateral;  . SHOULDER ARTHROSCOPY W/ ROTATOR CUFF REPAIR     LFT     OB History   No obstetric history on file.     Family History  Problem Relation Age of Onset  . Heart disease Mother   . Heart disease Father   . Heart attack Father   . Breast cancer Sister 91  . Colon cancer Sister   . Cancer Sister        breast  . Heart disease Sister   . Cancer Sister        colon  . Cancer Sister        colon  . Hypertension Daughter   . Hyperlipidemia Daughter      Social History   Tobacco Use  . Smoking status: Never Smoker  . Smokeless tobacco: Never Used  Vaping Use  . Vaping Use: Never used  Substance Use Topics  . Alcohol use: No  . Drug use: No    Home Medications Prior to Admission medications   Medication Sig Start Date End Date Taking? Authorizing Provider  acetaminophen (TYLENOL) 500 MG tablet Take 1,000 mg by mouth every 8 (eight) hours as needed for headache.    Yes [provider]  apixaban (ELIQUIS) 2.5 MG TABS tablet Take 1 tablet (2.5 mg total) by mouth 2 (two) times daily. Prevent Blood Clots Patient taking differently: Take 2.5 mg by mouth 2 (two) times daily.  01/07/20  Yes Kilroy, Luke K, PA-C  atenolol (TENORMIN) 100 MG tablet Take 1 tablet     Daily     for BP Patient taking differently: Take 100 mg by mouth daily.  03/01/20  Yes Unk Pinto, MD  Cholecalciferol (VITAMIN D-3) 125 MCG (5000 UT) TABS Take 5,000 Units by mouth daily.   Yes [provider]  clonazePAM (KLONOPIN) 1 MG tablet Take 1/2 to 1 tablet 3 x / day at Meals for Anxiety / Nerves Patient taking differently: Take 0.5 mg by mouth 2 (two) times daily as needed for anxiety. Take 1/2 to 1 tablet 3 x / day at Meals for Anxiety / Nerves 12/15/19  Yes Unk Pinto, MD  conjugated estrogens (PREMARIN) vaginal cream Place 1 Applicatorful vaginally See admin instructions. Apply one applicatorful vaginally once a week   Yes [provider]  Cyanocobalamin (VITAMIN B 12 PO) Take 1 tablet by mouth daily.   Yes [provider]  diclofenac Sodium (VOLTAREN) 1 % GEL Apply 2 grams 3 to 4 x / day as needed for Pain Patient taking differently: Apply 2 g topically 2 (two) times daily as needed (pain).  06/30/19  Yes Unk Pinto, MD  dicyclomine (BENTYL) 20 MG tablet Take  1 tablet  4 x /day with  Meals & Bedtime for Cramping, Nausea  or Bloating Patient taking differently: Take 20 mg by mouth 4 (four) times daily -  before meals and  at bedtime.  12/15/19  Yes Unk Pinto, MD  gabapentin (NEURONTIN) 600 MG tablet TAKE 1/2 TO 1 TABLET BY MOUTH 3 TIMES DAILY FOR FACIAL PAIN Patient taking differently: Take 300 mg by mouth 3 (three) times daily.  11/17/19  Yes Unk Pinto, MD  mirtazapine (REMERON) 15 MG tablet Take 1 tablet at Bedtime for Appetite Patient taking differently: Take 15 mg by mouth at bedtime. Take 1 tablet at Bedtime for Appetite 12/29/19  Yes Unk Pinto, MD  olmesartan (BENICAR) 40 MG tablet Take      1 tablet      Daily      for BP Patient taking differently: Take 40 mg by mouth daily.  03/28/20  Yes Unk Pinto, MD  omeprazole (PRILOSEC) 40 MG capsule Take 1 capsule (40 mg total) by mouth daily before breakfast. for Indigestion & Heartburn Patient taking differently: Take 40 mg by mouth daily before breakfast.  02/18/20  Yes Liane Comber, NP  polyvinyl alcohol (ARTIFICIAL TEARS) 1.4 % ophthalmic solution Place 1 drop into both eyes 2 (two) times daily as needed for dry eyes.   Yes [provider]  lovastatin (MEVACOR) 20 MG tablet Take 1 tablet at Bedtime for Cholesterol Patient not taking: Reported on 05/27/2020 03/17/19   Unk Pinto, MD    Allergies    Latex, Amoxicillin, Aspirin, Barbiturates, Celebrex [celecoxib], Codeine, Evista [raloxifene], Lipitor [atorvastatin], Morphine and related, Oruvail [ketoprofen], Pantoprazole, Paraffin, Prochlorperazine, Proloprim [trimethoprim], Venlafaxine, Vibra-tab [doxycycline], Vioxx [rofecoxib], Betadine [povidone iodine], and Caffeine  Review of Systems   Review of Systems  Constitutional: Negative for chills and fever.  HENT: Negative for congestion and rhinorrhea.   Eyes: Negative for redness and visual disturbance.  Respiratory: Negative for shortness of breath and wheezing.   Cardiovascular: Negative for chest pain and palpitations.  Gastrointestinal: Positive for abdominal pain, blood in stool, diarrhea and hematochezia. Negative  for nausea and vomiting.  Genitourinary: Negative for dysuria and urgency.  Musculoskeletal: Negative for arthralgias and myalgias.  Skin: Negative for pallor and wound.  Neurological: Negative for dizziness and headaches.    Physical Exam Updated Vital Signs BP (!) 174/83 (BP Location: Left Arm)   Pulse 83   Temp 98 F (36.7 C) (Oral)   Resp 19   SpO2 97%   Physical Exam Vitals and nursing note reviewed.  Constitutional:      General: She is not in acute distress.    Appearance: She is well-developed. She is not diaphoretic.  HENT:     Head: Normocephalic and atraumatic.  Eyes:     Pupils: Pupils are equal, round, and reactive to light.  Cardiovascular:     Rate and Rhythm: Normal rate and regular rhythm.     Heart sounds: No murmur heard.  No friction rub. No gallop.   Pulmonary:     Effort: Pulmonary effort is normal.     Breath sounds: No wheezing or rales.  Abdominal:     General: There is no distension.     Palpations: Abdomen is soft.     Tenderness: There is abdominal tenderness (diffuse lower worst to the LLQ).  Musculoskeletal:        General: No tenderness.     Cervical back: Normal range of motion and neck supple.  Skin:    General: Skin is warm and dry.  Neurological:     Mental Status: She is alert and oriented to person, place, and time.  Psychiatric:        Behavior: Behavior normal.     ED Results / Procedures / Treatments   Labs (all labs ordered are listed, but only abnormal results are displayed) Labs Reviewed  CBC WITH DIFFERENTIAL/PLATELET - Abnormal; Notable for the following components:      Result Value   WBC 11.3 (*)    RBC 3.69 (*)    Hemoglobin 11.3 (*)    HCT 34.2 (*)    Neutro Abs 8.7 (*)    All other components  within normal limits  COMPREHENSIVE METABOLIC PANEL - Abnormal; Notable for the following components:   Sodium 128 (*)    Chloride 90 (*)    Glucose, Bld 113 (*)    All other components within normal limits    URINALYSIS, ROUTINE W REFLEX MICROSCOPIC - Abnormal; Notable for the following components:   Color, Urine STRAW (*)    Hgb urine dipstick SMALL (*)    All other components within normal limits  I-STAT CHEM 8, ED - Abnormal; Notable for the following components:   Sodium 128 (*)    Chloride 89 (*)    Glucose, Bld 112 (*)    All other components within normal limits  LIPASE, BLOOD    EKG None  Radiology CT ABDOMEN PELVIS W CONTRAST  Result Date: 05/27/2020 CLINICAL DATA:  Left lower quadrant abdominal pain. Rectal bleeding. History of diverticulitis. EXAM: CT ABDOMEN AND PELVIS WITH CONTRAST TECHNIQUE: Multidetector CT imaging of the abdomen and pelvis was performed using the standard protocol following bolus administration of intravenous contrast. CONTRAST:  22mL OMNIPAQUE IOHEXOL 300 MG/ML  SOLN COMPARISON:  06/05/2019 FINDINGS: Lower chest: Dependent atelectasis noted in the lower lungs. Heart is enlarged. Hepatobiliary: No suspicious focal abnormality within the liver parenchyma. There is no evidence for gallstones, gallbladder wall thickening, or pericholecystic fluid. No intrahepatic or extrahepatic biliary dilation. Pancreas: No focal mass lesion. No dilatation of the main duct. No intraparenchymal cyst. No peripancreatic edema. Spleen: No splenomegaly. No focal mass lesion. Adrenals/Urinary Tract: No adrenal nodule or mass. Right kidney unremarkable. Tiny hypodensity in the left kidney is too small to characterize but likely benign. No evidence for hydroureter. The urinary bladder appears normal for the degree of distention. Stomach/Bowel: Stomach is unremarkable. No gastric wall thickening. No evidence of outlet obstruction. Duodenum is normally positioned as is the ligament of Treitz. No small bowel wall thickening. No small bowel dilatation. The terminal ileum is normal. The appendix is not visualized, but there is no edema or inflammation in the region of the cecum. No gross colonic  mass. No colonic wall thickening. Diverticular changes are noted in the left colon without evidence of diverticulitis. Vascular/Lymphatic: There is abdominal aortic atherosclerosis without aneurysm. There is no gastrohepatic or hepatoduodenal ligament lymphadenopathy. No retroperitoneal or mesenteric lymphadenopathy. No pelvic sidewall lymphadenopathy. Reproductive: The uterus is surgically absent. There is no adnexal mass. Other: No intraperitoneal free fluid. Musculoskeletal: No worrisome lytic or sclerotic osseous abnormality. Surgical changes noted L4-5. IMPRESSION: 1. No acute findings in the abdomen or pelvis. Specifically, no findings to explain the patient's history of left lower quadrant pain and rectal bleeding. 2. Left colonic diverticulosis without diverticulitis. 3. Aortic Atherosclerosis (ICD10-I70.0). Electronically Signed   By: Misty Stanley M.D.   On: 05/27/2020 14:36    Procedures Procedures (including critical care time)  Medications Ordered in ED Medications  sodium chloride (PF) 0.9 % injection (has no administration in time range)  clonazePAM (KLONOPIN) tablet 0.5 mg (has no administration in time range)  ondansetron (ZOFRAN) injection 4 mg (4 mg Intravenous Given 05/27/20 1232)  HYDROmorphone (DILAUDID) injection 0.5 mg (0.5 mg Intravenous Given 05/27/20 1232)  iohexol (OMNIPAQUE) 300 MG/ML solution 75 mL (75 mLs Intravenous Contrast Given 05/27/20 1330)    ED Course  I have reviewed the triage vital signs and the nursing notes.  Pertinent labs & imaging results that were available during my care of the patient were reviewed by me and considered in my medical decision making (see chart for details).  MDM Rules/Calculators/A&P                          86 yo F with a chief complaint of lower abdominal pain.  Going on for the past week.  Associated with constipation initially took some laxatives and has had some blood noted when she wipes with toilet paper.  History of  diverticulitis and she feels like the same.  Will obtain a CT scan blood work fluids pain and nausea medicine reassess.  CT without acute pathology. Patient feeling mildly better.   Rectal without bleeding.  No hemorrhoids.   2:58 PM:  I have discussed the diagnosis/risks/treatment options with the patient and believe the pt to be eligible for discharge home to follow-up with PCP. We also discussed returning to the ED immediately if new or worsening sx occur. We discussed the sx which are most concerning (e.g., sudden worsening pain, fever, inability to tolerate by mouth) that necessitate immediate return. Medications administered to the patient during their visit and any new prescriptions provided to the patient are listed below.  Medications given during this visit Medications  sodium chloride (PF) 0.9 % injection (has no administration in time range)  clonazePAM (KLONOPIN) tablet 0.5 mg (has no administration in time range)  ondansetron (ZOFRAN) injection 4 mg (4 mg Intravenous Given 05/27/20 1232)  HYDROmorphone (DILAUDID) injection 0.5 mg (0.5 mg Intravenous Given 05/27/20 1232)  iohexol (OMNIPAQUE) 300 MG/ML solution 75 mL (75 mLs Intravenous Contrast Given 05/27/20 1330)     The patient appears reasonably screen and/or stabilized for discharge and I doubt any other medical condition or other [facility] requiring further screening, evaluation, or treatment in the ED at this time prior to discharge.   Final Clinical Impression(s) / ED Diagnoses Final diagnoses:  Generalized abdominal pain    Rx / DC Orders ED Discharge Orders    None       Deno Etienne, DO 05/27/20 1458

## 2020-05-27 NOTE — ED Notes (Signed)
Waiting on ride home.

## 2020-05-31 ENCOUNTER — Ambulatory Visit (INDEPENDENT_AMBULATORY_CARE_PROVIDER_SITE_OTHER): Payer: PPO | Admitting: Internal Medicine

## 2020-05-31 ENCOUNTER — Other Ambulatory Visit: Payer: Self-pay

## 2020-05-31 ENCOUNTER — Encounter: Payer: Self-pay | Admitting: Internal Medicine

## 2020-05-31 VITALS — BP 146/74 | HR 57 | Temp 97.0°F | Resp 16 | Ht 63.5 in | Wt 112.4 lb

## 2020-05-31 DIAGNOSIS — D72829 Elevated white blood cell count, unspecified: Secondary | ICD-10-CM

## 2020-05-31 DIAGNOSIS — Z79899 Other long term (current) drug therapy: Secondary | ICD-10-CM

## 2020-05-31 DIAGNOSIS — D509 Iron deficiency anemia, unspecified: Secondary | ICD-10-CM

## 2020-05-31 DIAGNOSIS — K625 Hemorrhage of anus and rectum: Secondary | ICD-10-CM

## 2020-05-31 DIAGNOSIS — D649 Anemia, unspecified: Secondary | ICD-10-CM

## 2020-05-31 DIAGNOSIS — R1032 Left lower quadrant pain: Secondary | ICD-10-CM | POA: Diagnosis not present

## 2020-05-31 DIAGNOSIS — E871 Hypo-osmolality and hyponatremia: Secondary | ICD-10-CM

## 2020-05-31 HISTORY — DX: Iron deficiency anemia, unspecified: D50.9

## 2020-05-31 NOTE — Progress Notes (Signed)
History of Present Illness:      His very nice 86 yo MWF was evaluated in the ER on Dec for LLQ discomfort and rectal bleeding with wiping.  Labs showed a mild anemia (Hgb 112.3 gm% and mild leukocytosis (11.3K), Hyponatremia (Na 128) , Abd CT  Showed diverticulosis w/o diverticulitis.         Exam was reported benign, patient was given IVF, Clomazepam 0.5 mg tab, Zofran 4 mg iv, and Dilaudid 0.5 mg. With no significant pathology suspected & patient sx's resolved, she was released for out-patient f/u.    Medications  .  atenolol (TENORMIN) 100 MG tablet, Take 1 tablet Daily      .  olmesartan  40 MG tablet, Take 1 tablet Daily    .  ELIQUIS 2.5 MG TABS tablet, Take 1 tablet  2  times daily.  Marland Kitchen  VITAMIN B 12 , Take 1 tablet  daily. Marland Kitchen  VITAMIN D 5000 UT, Take  daily. .  clonazePAM  1 MG tablet, Take 1/2 tablet 3 x / day at Meals for Anxiety      conjugated estrogens (PREMARIN) vaginal cream, Place 1 Applicatorful vaginally  .  diclofenac 1 % GEL, Apply 2 grams 3 to 4 x / day as needed for Pain .  dicyclomine  20 MG tablet, Take  1 tablet  4 x /day  .  gabapentin  600 MG tablet, TAKE 1/2 TO 1 TABLET  3 TIMES DAILY  .  mirtazapine  15 MG tablet, Take 1 tablet at Bedtime for Appetite  .  omeprazole  40 MG capsule, Take 1 capsule  daily  .  ARTIFICIAL TEARS 1.4 % ophth soln, Place 1 drop into both eyes 2 (two) times daily as needed   Problem list She has Headache, post-traumatic, chronic; Hypertension; Hyperlipidemia; GERD; Vitamin D deficiency; Atypical facial pain; Medication management; Encounter for Medicare annual wellness exam; Mild malnutrition ([place]); COPD (chronic obstructive pulmonary disease) with emphysema ([place]); Tortuous aorta ([place]); Osteoporosis; Aortic atherosclerosis ([place]); Hepatic steatosis; Renal infarct ([place]); Chronic hyponatremia; Chronic hyperkalemia; Coronary atherosclerosis due to lipid rich plaque; Atrial fibrillation with controlled ventricular response ([place]); Diastolic  CHF ([place]); History of embolic stroke; Syncope; Epigastric discomfort; and Anxiety on their problem list.   Observations/Objective:  BP  146/74   P 57   T 97 F   R 16   Ht 5' 3.5"    Wt 112 lb 6.4 oz   SpO2 99%   BMI 19.60   HEENT - WNL. Neck - supple.  Chest - Clear equal BS. Cor - Nl HS. RRR w/o sig MGR. PP 1(+). No edema. Abd - Soft w/o palpable masses, organomegaly or tenderness.  MS- FROM w/o deformities.  Gait Nl. Neuro -  Nl w/o focal abnormalities.  Assessment and Plan:  1. Left lower quadrant abdominal pain  - CBC with Differential/Platelet  2. Rectal bleeding  - CBC with Differential/Platelet  3. Anemia  - CBC with Differential/Platelet  4. Leukocytosis  - CBC with Differential/Platelet  5. Hyponatremia  - COMPLETE METABOLIC PANEL WITH GFR  6. Medication management  - CBC with Differential/Platelet - COMPLETE METABOLIC PANEL WITH GFR   Follow Up Instructions: Patient was encouraged bland low residue diet.        I discussed the assessment and treatment plan with the patient. The patient was provided an opportunity to ask questions and all were answered. The patient agreed with the plan and demonstrated an understanding of the instructions.  The patient was advised to call back or seek an in-person evaluation if the symptoms worsen or if the condition fails to improve as anticipated.   Kirtland Bouchard, MD

## 2020-06-01 LAB — COMPLETE METABOLIC PANEL WITH GFR
AG Ratio: 1.7 (calc) (ref 1.0–2.5)
ALT: 12 U/L (ref 6–29)
AST: 19 U/L (ref 10–35)
Albumin: 4.3 g/dL (ref 3.6–5.1)
Alkaline phosphatase (APISO): 107 U/L (ref 37–153)
BUN: 9 mg/dL (ref 7–25)
CO2: 29 mmol/L (ref 20–32)
Calcium: 9.1 mg/dL (ref 8.6–10.4)
Chloride: 90 mmol/L — ABNORMAL LOW (ref 98–110)
Creat: 0.79 mg/dL (ref 0.60–0.88)
GFR, Est African American: 80 mL/min/{1.73_m2} (ref >=60)
GFR, Est Non African American: 69 mL/min/{1.73_m2} (ref >=60)
Globulin: 2.6 g/dL (calc) (ref 1.9–3.7)
Glucose, Bld: 95 mg/dL (ref 65–99)
Potassium: 5.1 mmol/L (ref 3.5–5.3)
Sodium: 127 mmol/L — ABNORMAL LOW (ref 135–146)
Total Bilirubin: 0.3 mg/dL (ref 0.2–1.2)
Total Protein: 6.9 g/dL (ref 6.1–8.1)

## 2020-06-01 LAB — CBC WITH DIFFERENTIAL/PLATELET
Absolute Monocytes: 955 cells/uL — ABNORMAL HIGH (ref 200–950)
Basophils Absolute: 62 cells/uL (ref 0–200)
Basophils Relative: 0.8 %
Eosinophils Absolute: 139 cells/uL (ref 15–500)
Eosinophils Relative: 1.8 %
HCT: 35.2 % (ref 35.0–45.0)
Hemoglobin: 11.9 g/dL (ref 11.7–15.5)
Lymphs Abs: 2087 cells/uL (ref 850–3900)
MCH: 30.3 pg (ref 27.0–33.0)
MCHC: 33.8 g/dL (ref 32.0–36.0)
MCV: 89.6 fL (ref 80.0–100.0)
MPV: 10.1 fL (ref 7.5–12.5)
Monocytes Relative: 12.4 %
Neutro Abs: 4458 cells/uL (ref 1500–7800)
Neutrophils Relative %: 57.9 %
Platelets: 260 10*3/uL (ref 140–400)
RBC: 3.93 10*6/uL (ref 3.80–5.10)
RDW: 12.6 % (ref 11.0–15.0)
Total Lymphocyte: 27.1 %
WBC: 7.7 10*3/uL (ref 3.8–10.8)

## 2020-06-01 NOTE — Progress Notes (Signed)
=========================================================== ===========================================================  -    CBC - Normal  / OK ===========================================================  -  Sodium  ( or salt) is still very low in Blood - So . . . . . . .   - Eat more salty foods  - Canned vegetables & use salt shaker more on food   -Will recheck at 12/30 /21 OV w Danton Sewer ===========================================================

## 2020-06-13 ENCOUNTER — Other Ambulatory Visit: Payer: Self-pay | Admitting: Internal Medicine

## 2020-06-13 DIAGNOSIS — K589 Irritable bowel syndrome without diarrhea: Secondary | ICD-10-CM

## 2020-06-23 ENCOUNTER — Ambulatory Visit (INDEPENDENT_AMBULATORY_CARE_PROVIDER_SITE_OTHER): Payer: PPO | Admitting: Adult Health Nurse Practitioner

## 2020-06-23 ENCOUNTER — Encounter: Payer: Self-pay | Admitting: Adult Health Nurse Practitioner

## 2020-06-23 ENCOUNTER — Ambulatory Visit: Payer: PPO | Admitting: Adult Health Nurse Practitioner

## 2020-06-23 ENCOUNTER — Other Ambulatory Visit: Payer: Self-pay

## 2020-06-23 VITALS — BP 144/80 | HR 71 | Temp 97.2°F | Resp 16 | Wt 114.0 lb

## 2020-06-23 DIAGNOSIS — E782 Mixed hyperlipidemia: Secondary | ICD-10-CM | POA: Diagnosis not present

## 2020-06-23 DIAGNOSIS — E441 Mild protein-calorie malnutrition: Secondary | ICD-10-CM | POA: Diagnosis not present

## 2020-06-23 DIAGNOSIS — D6859 Other primary thrombophilia: Secondary | ICD-10-CM

## 2020-06-23 DIAGNOSIS — R634 Abnormal weight loss: Secondary | ICD-10-CM | POA: Diagnosis not present

## 2020-06-23 DIAGNOSIS — M25511 Pain in right shoulder: Secondary | ICD-10-CM

## 2020-06-23 DIAGNOSIS — G501 Atypical facial pain: Secondary | ICD-10-CM

## 2020-06-23 DIAGNOSIS — I700 Atherosclerosis of aorta: Secondary | ICD-10-CM | POA: Diagnosis not present

## 2020-06-23 DIAGNOSIS — G8929 Other chronic pain: Secondary | ICD-10-CM

## 2020-06-23 DIAGNOSIS — I771 Stricture of artery: Secondary | ICD-10-CM | POA: Diagnosis not present

## 2020-06-23 DIAGNOSIS — I4891 Unspecified atrial fibrillation: Secondary | ICD-10-CM | POA: Diagnosis not present

## 2020-06-23 DIAGNOSIS — I10 Essential (primary) hypertension: Secondary | ICD-10-CM | POA: Diagnosis not present

## 2020-06-23 DIAGNOSIS — R0789 Other chest pain: Secondary | ICD-10-CM

## 2020-06-23 DIAGNOSIS — G44321 Chronic post-traumatic headache, intractable: Secondary | ICD-10-CM

## 2020-06-23 DIAGNOSIS — N280 Ischemia and infarction of kidney: Secondary | ICD-10-CM

## 2020-06-23 MED ORDER — DEXAMETHASONE 2 MG PO TABS: ORAL_TABLET | ORAL | 0 refills | Status: DC

## 2020-06-23 MED ORDER — MIRTAZAPINE 15 MG PO TABS: ORAL_TABLET | ORAL | 1 refills | Status: DC

## 2020-06-23 NOTE — Progress Notes (Signed)
FOLLOW UP 3 MONTH  Assessment:   Essential hypertension Continue current medications:atenolol 100mg , olmesartan 40mg  Monitor blood pressure at home; call if consistently over 130/80 Continue DASH diet.   Reminder to go to the ER if any CP, SOB, nausea, dizziness, severe HA, changes vision/speech, left arm numbness and tingling and jaw pain. -     CBC with Differential/Platelet -     COMPLETE METABOLIC PANEL WITH GFR -     TSH  Mixed hyperlipidemia No medications at this time, d/c lovastatin related to arthrlagias Discussed dietary and exercise modifications Low fat diet -     Lipid panel   Weight loss Mild malnutrition (HCC) Improved, Continue: -     mirtazapine (REMERON) 15 MG tablet; 1/2-1 tablet at night for sleep, anxiety, headache, and to help increase weight.  Chronic right shoulder pain Polyarthralgia Chronic Will do trial of dexamethasone  Right sided chest wall pain MSK vs breast, discussed referral for MGM after Dexamethasone treatment if no improvement Rx Dexamethasone  Aortic atherosclerosis (HCC) Control blood pressure, cholesterol, glucose, increase exercise.  Tortuous aorta (HCC) Control blood pressure, cholesterol, glucose, increase exercise.   Renal infarct ([place]) Continue on elliquis Atrial fibrillation with controlled ventricular response ([employer]) Continue elliquis, she is rate controlled, continue follow up cardio  Chronic post-traumatic headache, not intractable Continue klonopin, try to limit use Add on remeron and see if this helps anxiety/eating/headache- start very low dose  Atypical facial pain -     mirtazapine (REMERON) 15 MG tablet; 1/2-1 tablet at night for sleep, anxiety, headache, and to help increase weight.  Osteoporosis, unspecified osteoporosis type, unspecified pathological fracture presence Monitor  Medication management -     Magnesium     Over 30 minutes of face to face interview, f exam, counseling, chart review and  critical decision making was performed Future Appointments  Date Time Provider [place]aut  06/23/2020 10:00 AM Garnet Sierras, NP [facility] None  07/05/2020 10:00 AM Skeet Latch, MD [facility]  10/10/2020 10:30 AM Garnet Sierras, NP [facility] None  01/11/2021 10:00 AM Liane Comber, NP [facility] None      Subjective:  Debra White is a 86 y.o. female who presents for  3 Month follow up.   She had her second vaccination of Pfzier yesterday.  She reports she has bn feling bad and having pain in hr arm with some buring.  She had splenic and renal infarcts in 2018, history of CVA and on Eliquis for Afib.  Echo 2019 EF 55-60% with severe LAE.  Declines TEE and loop recorder. Has seen Dr. Oval Linsey.   Husband has dementia, he is doing PT and working on strength.   She continue to have headaches and she states she feels weak. She is unintentionally losing weight.  She still has a good appetite but she is stressed from having to take care of her husband, they will have been married for 65 years this year.  She states sometimes water will get caught in her throat, denies pills or food getting caught.  She has to take the dicyclomine only at night.  She has had a recent CT AB for gross hematuria, was negative.  Last colonoscopy: 2013 Last mammogram: 08/2019 BMI is There is no height or weight on file to calculate BMI., she is working on diet and exercise. No coughing, no wheezing, some SOB with carrying groceries in. Follows with cardiologist soon for abnormal CT/CXR.  Wt Readings from Last 3 Encounters:  05/31/20 112 lb 6.4 oz (  51 kg)  03/23/20 107 lb (48.5 kg)  03/03/20 108 lb 9.6 oz (49.3 kg)   She has had elevated blood pressure for over 30 years. Her blood pressure has been controlled at home, today their BP is   She does not workout. She denies chest pain, shortness of breath, dizziness.  She is not on cholesterol medication at this time.  Her  cholesterol is not at goal. The cholesterol last visit was:   Lab Results  Component Value Date   CHOL 220 (H) 03/23/2020   HDL 91 03/23/2020   LDLCALC 110 (H) 03/23/2020   TRIG 97 03/23/2020   CHOLHDL 2.4 03/23/2020    She has prediabetes.  has not been working on diet and exercise for prediabetes, and denies hyperglycemia, hypoglycemia , nausea, polydipsia, polyuria and visual disturbances. Last A1C in the office was:  Lab Results  Component Value Date   HGBA1C 5.7 (H) 03/23/2020   Last GFR: Lab Results  Component Value Date   GFRNONAA 69 05/31/2020   Patient is on Vitamin D supplement.   Lab Results  Component Value Date   VD25OH 93 12/15/2019    Medication Review:   Current Outpatient Medications (Cardiovascular):  .  atenolol (TENORMIN) 100 MG tablet, Take 1 tablet     Daily     for BP (Patient taking differently: Take 100 mg by mouth daily. ) .  lovastatin (MEVACOR) 20 MG tablet, Take 1 tablet at Bedtime for Cholesterol .  olmesartan (BENICAR) 40 MG tablet, Take      1 tablet      Daily      for BP (Patient taking differently: Take 40 mg by mouth daily. )   Current Outpatient Medications (Analgesics):  .  acetaminophen (TYLENOL) 500 MG tablet, Take 1,000 mg by mouth every 8 (eight) hours as needed for headache.   Current Outpatient Medications (Hematological):  .  apixaban (ELIQUIS) 2.5 MG TABS tablet, Take 1 tablet (2.5 mg total) by mouth 2 (two) times daily. Prevent Blood Clots (Patient taking differently: Take 2.5 mg by mouth 2 (two) times daily. ) .  Cyanocobalamin (VITAMIN B 12 PO), Take 1 tablet by mouth daily.  Current Outpatient Medications (Other):  Marland Kitchen  Cholecalciferol (VITAMIN D-3) 125 MCG (5000 UT) TABS, Take 5,000 Units by mouth daily. .  clonazePAM (KLONOPIN) 1 MG tablet, Take 1/2 to 1 tablet 3 x / day at Meals for Anxiety / Nerves (Patient taking differently: Take 0.5 mg by mouth 2 (two) times daily as needed for anxiety. Take 1/2 to 1 tablet 3 x / day  at Meals for Anxiety / Nerves) .  conjugated estrogens (PREMARIN) vaginal cream, Place 1 Applicatorful vaginally See admin instructions. Apply one applicatorful vaginally once a week .  diclofenac Sodium (VOLTAREN) 1 % GEL, Apply 2 grams 3 to 4 x / day as needed for Pain (Patient taking differently: Apply 2 g topically 2 (two) times daily as needed (pain). ) .  dicyclomine (BENTYL) 20 MG tablet, Take     1 tablet     4 x /day     before Meals & Bedtime     If needed for Cramping, Bloating, Nausea or Diarrhea. .  gabapentin (NEURONTIN) 600 MG tablet, TAKE 1/2 TO 1 TABLET BY MOUTH 3 TIMES DAILY FOR FACIAL PAIN (Patient taking differently: Take 300 mg by mouth 3 (three) times daily. ) .  mirtazapine (REMERON) 15 MG tablet, Take 1 tablet at Bedtime for Appetite (Patient taking differently: Take 15 mg  by mouth at bedtime. Take 1 tablet at Bedtime for Appetite) .  omeprazole (PRILOSEC) 40 MG capsule, Take 1 capsule (40 mg total) by mouth daily before breakfast. for Indigestion & Heartburn (Patient taking differently: Take 40 mg by mouth daily before breakfast. ) .  polyvinyl alcohol (ARTIFICIAL TEARS) 1.4 % ophthalmic solution, Place 1 drop into both eyes 2 (two) times daily as needed for dry eyes.   Allergies  Allergen Reactions  . Latex Hives    TONGUE HAD BLISTERS WHEN HAD DENTAL SURGERY  . Amoxicillin Nausea And Vomiting  . Aspirin Other (See Comments)    Gi upset  . Barbiturates Other (See Comments)    Unknown reaction  . Celebrex [Celecoxib] Other (See Comments)    Unknown reaction  . Codeine Itching  . Evista [Raloxifene] Other (See Comments)    Unknown reaction  . Lipitor [Atorvastatin] Other (See Comments)    Unknown reaction  . Morphine And Related Other (See Comments)    Unknown reaction  . Oruvail [Ketoprofen] Other (See Comments)    Unknown reaction  . Pantoprazole Diarrhea  . Paraffin Other (See Comments)    Unknown reaction  . Prochlorperazine Other (See Comments)     Uncontrolled shaking  . Proloprim [Trimethoprim] Other (See Comments)    Unknown reaction  . Venlafaxine Nausea Only  . Vibra-Tab [Doxycycline] Other (See Comments)    Unknown reaction  . Vioxx [Rofecoxib] Other (See Comments)    edema  . Betadine [Povidone Iodine] Rash  . Caffeine Palpitations    Current Problems (verified) Patient Active Problem List   Diagnosis Date Noted  . Anemia 05/31/2020  . Rectal bleeding 05/31/2020  . Left lower quadrant abdominal pain 05/31/2020  . Epigastric discomfort 11/19/2019  . Anxiety 11/19/2019  . Syncope 10/10/2019  . Diastolic CHF (HCC) 06/29/2019  . History of embolic stroke 06/29/2019  . Atrial fibrillation with controlled ventricular response (HCC) 03/12/2019  . Coronary atherosclerosis due to lipid rich plaque 03/26/2018  . Renal infarct (HCC) 03/22/2018  . Hyponatremia 03/22/2018  . Chronic hyperkalemia 03/22/2018  . Hepatic steatosis 11/17/2017  . Aortic atherosclerosis (HCC) by Abd CT scan (05/2020)  08/08/2017  . COPD (chronic obstructive pulmonary disease) with emphysema (HCC) 12/21/2015  . 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 Osteoporosis 12/21/2015  . Mild malnutrition (HCC) 05/11/2015  . Encounter for Medicare annual wellness exam 04/08/2015  . Medication management 01/27/2014  . 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 Vitamin D deficiency 07/08/2013  . Hypertension   . Hyperlipidemia   . GERD   . Headache, post-traumatic, chronic 09/20/2011    Screening Tests Immunization History  Administered Date(s) Administered  . Influenza, High Dose Seasonal PF 03/25/2014, 04/05/2016, 06/02/2018, 03/12/2019, 03/23/2020  . Influenza-Unspecified 03/09/2013, 03/11/2015  . Pneumococcal Conjugate-13 12/21/2015  . Pneumococcal Polysaccharide-23 06/25/2002  . Td 06/26/2003    Preventative care: Last colonoscopy: 2013 EGD 2013 Last mammogram: 08/2019 CT head 05/2019 CT renal stone AB 05/2019 CTA Ab 04/2018 CXR 05/2019:  IMPRESSION: 1. Cardiomegaly with mild interstitial thickening most prominent in the lung bases, similar to the prior chest radiographs. Current chest CT lung bases suggested congestive heart failure with interstitial edema, which is supported on the current chest radiograph. No evidence of pneumonia.  DEXA: 2017  Prior vaccinations: TD or Tdap: 2015, DUE Declines  Influenza: 03/2020  Pneumococcal: 2004 Prevnar13: 2017 Shingles/Zostavax: N/A  Names of Other Physician/Practitioners you currently use: 1. [facility] here for primary care 2.  Eye Exam 2019, DUE  for 2021 3. Dentist  2021  Patient Care Team: Unk Pinto, MD as PCP - General (Internal Medicine) Skeet Latch, MD as PCP - Cardiology (Cardiology) Darleen Crocker, MD as Consulting Physician (Ophthalmology) Ladene Artist, MD as Consulting Physician (Gastroenterology) Cameron Sprang, MD as Consulting Physician (Neurology) Jodi Marble, MD as Consulting Physician (Otolaryngology)  SURGICAL HISTORY She  has a past surgical history that includes Abdominal hysterectomy; Hand surgery; Shoulder arthroscopy w/ rotator cuff repair; Cervical disc surgery; Back surgery; ORIF tripod fracture (07/13/2011); Colonoscopy; Facial fracture surgery; Back surgery; Ptosis repair (Bilateral, 11/22/2016); and Breast excisional biopsy.  FAMILY HISTORY Her family history includes Breast cancer (age of onset: 8) in her sister; Cancer in her sister, sister, and sister; Colon cancer in her sister; Heart attack in her father; Heart disease in her father, mother, and sister; Hyperlipidemia in her daughter; Hypertension in her daughter. SOCIAL HISTORY She  reports that she has never smoked. She has never used smokeless tobacco. She reports that she does not drink alcohol and does not use drugs.    Review of Systems  Constitutional: Positive for malaise/fatigue Negative for chills, diaphoresis and  fever.  HENT: Negative for congestion, ear discharge, ear pain, hearing loss, nosebleeds, sinus pain, sore throat and tinnitus.   Eyes: Negative for blurred vision, double vision, photophobia, pain, discharge and redness.  Respiratory: Negative for cough, hemoptysis, sputum production, shortness of breath, wheezing and stridor.   Cardiovascular: Negative for chest pain, palpitations, orthopnea, claudication, leg swelling and PND.  Gastrointestinal:  Negative for nausea, abdominal pain, blood in stool, constipation, diarrhea, heartburn, melena and vomiting.  Genitourinary: Negative for dysuria, flank pain, frequency, hematuria and urgency.  Musculoskeletal: Positive for back pain, joint pain and myalgias. Negative for falls and neck pain.  Skin: Negative for itching and rash.  Neurological: Negative for dizziness, tingling, tremors, sensory change, speech change, focal weakness, seizures, loss of consciousness, weakness and headaches.  Endo/Heme/Allergies: Negative for environmental allergies and polydipsia. Does not bruise/bleed easily.  Psychiatric/Behavioral: Negative for depression, hallucinations, memory loss, substance abuse and suicidal ideas. The patient is nervous/anxious. The patient does not have insomnia.      Objective:     Vitals with BMI 06/23/2020 05/31/2020 05/27/2020  Height - 5' 3.5" -  Weight 114 lbs 112 lbs 6 oz -  BMI [id number] -  Systolic [id number]  Diastolic 80 74 81  Pulse 71 57 66    General appearance: alert, Thin, no distress, WD/WN, female HEENT: normocephalic, sclerae anicteric, TMs pearly right, left cerumen impaction, nares patent, no discharge or erythema, pharynx normal Oral cavity: MMM, no lesions Neck: supple, no lymphadenopathy, no thyromegaly, no masses Heart: RRR, normal S1, S2, no murmurs Lungs: CTA bilaterally, no wheezes, rhonchi, or rales Abdomen: +bs, soft, diffuse tenderness, non distended, no masses, no hepatomegaly, no  splenomegaly Musculoskeletal: nontender, no swelling, no obvious deformity. Right shoulder pain with flexion & extension, chest wall tendrness 4th ICS. Extremities: no edema, no cyanosis, no clubbing Pulses: 2+ symmetric, upper and lower extremities, normal cap refill Neurological: alert, oriented x 3, CN2-12 intact, strength normal upper extremities and lower extremities, sensation normal throughout, DTRs 2+ throughout, no cerebellar signs, gait normal Psychiatric: normal affect, behavior normal, pleasant     Garnet Sierras, Laqueta Jean, DNP [facility] 06/23/2020  10:19 AM

## 2020-06-23 NOTE — Patient Instructions (Addendum)
   We are going to send in Dexamthasone for you to take for the pain in you right chest/arm. Start this medication on Friday 06/24/20.  Take this medeicaiton with food to avoid stomach upset.  If th pain improves it was from muscle pain.  IF it DOES NOT IMPROVE then call to let the office know.   We also sent in a refill of the Mirtazapine (Remeron) 15mg  take this at bedtime.  Will help with appetite and sleep.    GENERAL HEALTH GOALS  Know what a healthy weight is for you (roughly BMI <25) and aim to maintain this  Aim for 7+ servings of fruits and vegetables daily  70-80+ fluid ounces of water or unsweet tea for healthy kidneys  Limit to max 1 drink of alcohol per day; avoid smoking/tobacco  Limit animal fats in diet for cholesterol and heart health - choose grass fed whenever available  Avoid highly processed foods, and foods high in saturated/trans fats  Aim for low stress - take time to unwind and care for your mental health  Aim for 150 min of moderate intensity exercise weekly for heart health, and weights twice weekly for bone health  Aim for 7-9 hours of sleep daily

## 2020-06-24 ENCOUNTER — Other Ambulatory Visit: Payer: Self-pay | Admitting: Internal Medicine

## 2020-06-24 LAB — COMPLETE METABOLIC PANEL WITH GFR
AG Ratio: 1.8 (calc) (ref 1.0–2.5)
ALT: 19 U/L (ref 6–29)
AST: 24 U/L (ref 10–35)
Albumin: 4.6 g/dL (ref 3.6–5.1)
Alkaline phosphatase (APISO): 108 U/L (ref 37–153)
BUN: 14 mg/dL (ref 7–25)
CO2: 30 mmol/L (ref 20–32)
Calcium: 9.4 mg/dL (ref 8.6–10.4)
Chloride: 97 mmol/L — ABNORMAL LOW (ref 98–110)
Creat: 0.73 mg/dL (ref 0.60–0.88)
GFR, Est African American: 88 mL/min/{1.73_m2} (ref >=60)
GFR, Est Non African American: 76 mL/min/{1.73_m2} (ref >=60)
Globulin: 2.6 g/dL (calc) (ref 1.9–3.7)
Glucose, Bld: 90 mg/dL (ref 65–99)
Potassium: 4.3 mmol/L (ref 3.5–5.3)
Sodium: 135 mmol/L (ref 135–146)
Total Bilirubin: 0.4 mg/dL (ref 0.2–1.2)
Total Protein: 7.2 g/dL (ref 6.1–8.1)

## 2020-06-24 LAB — CBC WITH DIFFERENTIAL/PLATELET
Absolute Monocytes: 921 cells/uL (ref 200–950)
Basophils Absolute: 42 cells/uL (ref 0–200)
Basophils Relative: 0.5 %
Eosinophils Absolute: 83 cells/uL (ref 15–500)
Eosinophils Relative: 1 %
HCT: 35.9 % (ref 35.0–45.0)
Hemoglobin: 11.9 g/dL (ref 11.7–15.5)
Lymphs Abs: 1469 cells/uL (ref 850–3900)
MCH: 30 pg (ref 27.0–33.0)
MCHC: 33.1 g/dL (ref 32.0–36.0)
MCV: 90.4 fL (ref 80.0–100.0)
MPV: 10.1 fL (ref 7.5–12.5)
Monocytes Relative: 11.1 %
Neutro Abs: 5785 cells/uL (ref 1500–7800)
Neutrophils Relative %: 69.7 %
Platelets: 260 10*3/uL (ref 140–400)
RBC: 3.97 10*6/uL (ref 3.80–5.10)
RDW: 12.9 % (ref 11.0–15.0)
Total Lymphocyte: 17.7 %
WBC: 8.3 10*3/uL (ref 3.8–10.8)

## 2020-06-24 LAB — LIPID PANEL
Cholesterol: 228 mg/dL — ABNORMAL HIGH (ref <200)
HDL: 73 mg/dL (ref >=50)
LDL Cholesterol (Calc): 127 mg/dL (calc) — ABNORMAL HIGH
Non-HDL Cholesterol (Calc): 155 mg/dL (calc) — ABNORMAL HIGH (ref <130)
Total CHOL/HDL Ratio: 3.1 (calc) (ref <5.0)
Triglycerides: 163 mg/dL — ABNORMAL HIGH (ref <150)

## 2020-06-29 ENCOUNTER — Other Ambulatory Visit: Payer: Self-pay

## 2020-06-29 DIAGNOSIS — N280 Ischemia and infarction of kidney: Secondary | ICD-10-CM

## 2020-06-29 DIAGNOSIS — E782 Mixed hyperlipidemia: Secondary | ICD-10-CM

## 2020-06-29 DIAGNOSIS — I700 Atherosclerosis of aorta: Secondary | ICD-10-CM

## 2020-06-29 MED ORDER — LOVASTATIN 20 MG PO TABS: ORAL_TABLET | ORAL | 3 refills | Status: DC

## 2020-07-04 ENCOUNTER — Other Ambulatory Visit: Payer: Self-pay | Admitting: Internal Medicine

## 2020-07-04 DIAGNOSIS — I10 Essential (primary) hypertension: Secondary | ICD-10-CM

## 2020-07-05 ENCOUNTER — Ambulatory Visit: Payer: PPO | Admitting: Cardiovascular Disease

## 2020-07-05 ENCOUNTER — Encounter: Payer: Self-pay | Admitting: Cardiovascular Disease

## 2020-07-05 ENCOUNTER — Other Ambulatory Visit: Payer: Self-pay

## 2020-07-05 VITALS — BP 130/72 | HR 56 | Ht 64.0 in | Wt 111.8 lb

## 2020-07-05 DIAGNOSIS — I5032 Chronic diastolic (congestive) heart failure: Secondary | ICD-10-CM

## 2020-07-05 DIAGNOSIS — I2583 Coronary atherosclerosis due to lipid rich plaque: Secondary | ICD-10-CM

## 2020-07-05 DIAGNOSIS — I2510 Atherosclerotic heart disease of native coronary artery without angina pectoris: Secondary | ICD-10-CM | POA: Diagnosis not present

## 2020-07-05 DIAGNOSIS — I4891 Unspecified atrial fibrillation: Secondary | ICD-10-CM | POA: Diagnosis not present

## 2020-07-05 DIAGNOSIS — I700 Atherosclerosis of aorta: Secondary | ICD-10-CM

## 2020-07-05 NOTE — Patient Instructions (Signed)
Medication Instructions:  Your physician recommends that you continue on your current medications as directed. Please refer to the Current Medication list given to you today.  *If you need a refill on your cardiac medications before your next appointment, please call your pharmacy*  Lab Work: NONE  Testing/Procedures: NONE  Follow-Up: At [facility], you and your health needs are our priority.  As part of our continuing mission to provide you with exceptional heart care, we have created designated Provider Care Teams.  These Care Teams include your primary Cardiologist (physician) and Advanced Practice Providers (APPs -  Physician Assistants and Nurse Practitioners) who all work together to provide you with the care you need, when you need it.  We recommend signing up for the patient portal called "MyChart".  Sign up information is provided on this After Visit Summary.  MyChart is used to connect with patients for Virtual Visits (Telemedicine).  Patients are able to view lab/test results, encounter notes, upcoming appointments, etc.  Non-urgent messages can be sent to your provider as well.   To learn more about what you can do with MyChart, go to [email or web address].    Your next appointment:   6 month(s)  The format for your next appointment:   In Person  Provider:   You may see Tiffany Montesano, MD or one of the following Advanced Practice Providers on your designated Care Team:    Luke Kilroy, PA-C  Callie Goodrich, PA-C  Jesse Cleaver, FNP   

## 2020-07-05 NOTE — Progress Notes (Signed)
Cardiology Office Note   Date:  07/05/2020   ID:  [name], DOB 01-03-1934, MRN [id number]  PCP:  Unk Pinto, MD  Cardiologist:   Skeet Latch, MD   No chief complaint on file.    History of Present Illness: Debra White is a 87 y.o. female with hypertension, emphysema, chronic hyponatremia, atherosclerosis of the aorta and renal/splenic infarcts who is being seen today for the evaluation of splenic and renal infarcts at the request of Unk Pinto, MD.  Debra White has been admitted to hospital multiple times recently due to a splenic/renal infarct and diverticulitis.  She had an echo 03/23/2018 that revealed LVEF 55 to 60% with mild mitral regurgitation and a severely dilated left atrium.  She wore a Holter monitor but didn't keep it on long because it made her itch.  She does note that she sometimes has palpitations and feels like her heart races at night.  She wonders if this is anxiety, as she does have frequent anxiety attacks.  Clonazepam seems to help.  She did not undergo a TEE at that time.  She was started on Eliquis for anticoagulation with plans to continue it for 6 months.  Since her hospitalization in September she was again hospitalized with diverticulitis in November.  She continues to have some epigastric and left upper quadrant discomfort with tight clothing.  She has no chest pain or shortness of breath.  She sometimes has abdominal bloating and discomfort, though this has been better recently.  She denies any melena or hematochezia recently.  She reports myalgias.  Her pravastatin dose was reduced from 40mg  to 20 mg with some improvement in her symptoms.  Debra White notes that she cannot tolerate aspirin due to GI upset.  While in the hospital she had multiple abdominal CTs that showed atherosclerosis of the aorta.  Since her last appointment [name] was diagnosed with atrial fibrillation.  She also had profound weight loss and her weight was  under 100 pounds.  She followed up with Kerin Ransom who suggested that digoxin be discontinued due to bradycardia.  However she was hesitant because she did not tolerate palpitations well.  2 days later she was admitted with syncope.  While in the hospital atenolol was decreased to 50 mg and digoxin was discontinued.  She wore a 7-day ZIO with improvement in her bradycardia.  She last saw Lurena Joiner 12/2019 and was doing well.  She had an echo 09/2019 that revealed LVEF 55 to 60% with indeterminate diastolic function. Lately she has been feeling well.  She has rare episodes of palpitations.  She had a fall and facial fractures that required surgery.  She still has discomfort from this.  She has not had any recent falls.  She denies any chest pain or shortness of breath.  Her husband has dementia and taking care of him keeps her busy.  She has no exertional symptoms.  She no longer has lightheadedness or dizziness.  She did have an episode of some discomfort in her right arm that improved with steroid injection.  She never has arm pain with walking.  She had lipids checked with her PCP and her LDL was elevated so lovastatin was resumed.   Past Medical History:  Diagnosis Date  . Acute on chronic combined systolic and diastolic CHF (congestive heart failure) ([place]) 06/29/2019  . Anxiety   . Arthritis   . Blood transfusion   . C. difficile colitis   . Candida esophagitis ([place]) 2013  EGD  . Cataract   . Chronic kidney disease    STONES  . Diverticulitis 04/05/2017  . Diverticulosis of colon (without mention of hemorrhage) 2007   Colonoscopy  . Dysrhythmia    RX  . Emphysema of lung ([place])   . Family history of colon cancer    sister  . Fibromyalgia   . Fracture, zygoma closed ([place]) 07/13/2011  . GERD (gastroesophageal reflux disease)   . Headache(784.0)   . Hyperlipemia   . Hypertension   . Kidney stones   . Ptosis, bilateral   . Renal infarct ([place])   . Splenic infarct     Past Surgical History:   Procedure Laterality Date  . ABDOMINAL HYSTERECTOMY    . BACK SURGERY     X2  . BACK SURGERY    . BREAST EXCISIONAL BIOPSY    . CERVICAL DISC SURGERY    . COLONOSCOPY    . FACIAL FRACTURE SURGERY    . HAND SURGERY     BIL   . ORIF TRIPOD FRACTURE  07/13/2011   Procedure: OPEN REDUCTION INTERNAL FIXATION (ORIF) TRIPOD FRACTURE;  Surgeon: Tyson Alias, MD;  Location: [facility];  Service: ENT;  Laterality: Right;  ORIF RIGHT ZYGOMA, ORBITAL FLOOR EXPLORATION WITH FROST STITCH (TEMPORARY TARSORRHAPHY)  . PTOSIS REPAIR Bilateral 11/22/2016   Procedure: INTERNAL PTOSIS REPAIR;  Surgeon: Clista Bernhardt, MD;  Location: [facility];  Service: Ophthalmology;  Laterality: Bilateral;  . SHOULDER ARTHROSCOPY W/ ROTATOR CUFF REPAIR     LFT     Current Outpatient Medications  Medication Sig Dispense Refill  . acetaminophen (TYLENOL) 500 MG tablet Take 1,000 mg by mouth every 8 (eight) hours as needed for headache.     Marland Kitchen apixaban (ELIQUIS) 2.5 MG TABS tablet Take 2.5 mg by mouth 2 (two) times daily. 1 Tablet Twice Daily    . atenolol (TENORMIN) 100 MG tablet Take 1 tablet     Daily     for BP (Patient taking differently: Take 100 mg by mouth daily.) 90 tablet 0  . Cholecalciferol (VITAMIN D-3) 125 MCG (5000 UT) TABS Take 5,000 Units by mouth daily.    . clonazePAM (KLONOPIN) 1 MG tablet Take       1/2 - 1 tablet         2 - 3 x /day         with Meals  ONLY        if needed for Anxiety Attack &  limit to 5 days /week to avoid Addiction & Dementia 270 tablet 0  . conjugated estrogens (PREMARIN) vaginal cream Place 1 Applicatorful vaginally See admin instructions. Apply one applicatorful vaginally once a week    . Cyanocobalamin (VITAMIN B 12 PO) Take 1 tablet by mouth daily.    Marland Kitchen dexamethasone (DECADRON) 2 MG tablet Take 1 tab 3 x day - 3 days, then 2 x day - 3 days, then 1 tab daily 20 tablet 0  . diclofenac Sodium (VOLTAREN) 1 % GEL Apply 2 grams 3 to 4 x / day as needed for Pain (Patient taking  differently: Apply 2 g topically 2 (two) times daily as needed (pain).) 350 g 3  . dicyclomine (BENTYL) 20 MG tablet Take     1 tablet     4 x /day     before Meals & Bedtime     If needed for Cramping, Bloating, Nausea or Diarrhea. 360 tablet 1  . gabapentin (NEURONTIN) 600 MG tablet TAKE 1/2  TO 1 TABLET BY MOUTH 3 TIMES DAILY FOR FACIAL PAIN (Patient taking differently: Take 300 mg by mouth 3 (three) times daily.) 270 tablet 1  . lovastatin (MEVACOR) 20 MG tablet Take 1 tablet at Bedtime for Cholesterol 90 tablet 3  . mirtazapine (REMERON) 15 MG tablet Take 1 tablet at Bedtime for Appetite 90 tablet 1  . olmesartan (BENICAR) 40 MG tablet Take     1 tablet     Daily       for BP 90 tablet 0  . omeprazole (PRILOSEC) 40 MG capsule Take 1 capsule (40 mg total) by mouth daily before breakfast. for Indigestion & Heartburn (Patient taking differently: Take 40 mg by mouth daily before breakfast.) 90 capsule 1  . polyvinyl alcohol (LIQUIFILM TEARS) 1.4 % ophthalmic solution Place 1 drop into both eyes 2 (two) times daily as needed for dry eyes.     No current facility-administered medications for this visit.    Allergies:   Latex, Amoxicillin, Aspirin, Barbiturates, Celebrex [celecoxib], Codeine, Evista [raloxifene], Lipitor [atorvastatin], Morphine and related, Oruvail [ketoprofen], Pantoprazole, Paraffin, Prochlorperazine, Proloprim [trimethoprim], Venlafaxine, Vibra-tab [doxycycline], Vioxx [rofecoxib], Betadine [povidone iodine], and Caffeine    Social History:  The patient  reports that she has never smoked. She has never used smokeless tobacco. She reports that she does not drink alcohol and does not use drugs.   Family History:  The patient's family history includes Breast cancer (age of onset: 52) in her sister; Cancer in her sister, sister, and sister; Colon cancer in her sister; Heart attack in her father; Heart disease in her father, mother, and sister; Hyperlipidemia in her daughter;  Hypertension in her daughter.    ROS:  Please see the history of present illness.   Otherwise, review of systems are positive for none.   All other systems are reviewed and negative.    PHYSICAL EXAM: VS:  BP 130/72   Pulse (!) 56   Ht 5\' 4"  (1.626 m)   Wt 111 lb 12.8 oz (50.7 kg)   SpO2 95%   BMI 19.19 kg/m  , BMI Body mass index is 19.19 kg/m. GENERAL:  Well appearing HEENT:  Pupils equal round and reactive, fundi not visualized, oral mucosa unremarkable NECK:  No jugular venous distention, waveform within normal limits, carotid upstroke brisk and symmetric, no bruits, no thyromegaly LYMPHATICS:  No cervical adenopathy LUNGS:  Clear to auscultation bilaterally HEART:  Irregularly irregular.  PMI not displaced or sustained,S1 and S2 within normal limits, no S3, no S4, no clicks, no rubs, no murmurs ABD:  Flat, positive bowel sounds normal in frequency in pitch, no bruits, no rebound, no guarding, no midline pulsatile mass, no hepatomegaly, no splenomegaly EXT:  2 plus pulses throughout, no edema, no cyanosis no clubbing SKIN:  No rashes no nodules NEURO:  Cranial nerves II through XII grossly intact, motor grossly intact throughout PSYCH:  Cognitively intact, oriented to person place and time   EKG:  EKG is not ordered today. The ekg ordered today demonstrates   Echo 03/23/18: Study Conclusions  - Left ventricle: The cavity size was normal. Wall thickness was   normal. Systolic function was normal. The estimated ejection   fraction was in the range of 55% to 60%. Wall motion was normal;   there were no regional wall motion abnormalities. Left   ventricular diastolic function parameters were normal. - Aortic valve: There was trivial regurgitation. - Mitral valve: There was mild regurgitation. - Left atrium: The atrium was severely dilated. -  Right atrium: The atrium was mildly dilated. - Pulmonary arteries: Systolic pressure was mildly increased. PA   peak pressure: 39 mm  Hg (S).  Impressions:  - Normal LV function; biatrial enlargement; mild MR and TR; mild   pulmonary hypertension.  7 Day Event Monitor 03/2018: (patient wore the monitor for 6 hours)  Quality: Fair.  Baseline artifact. Predominant rhythm: sinus bradycardia Average heart rate: 59 bpm Max heart rate: 77 bpm Min heart rate: 48 bpm Pauses >2.5 seconds: none  No arrhythmias noted  7 Day Event Monitor 12/21/2019:  Quality: Fair.  Baseline artifact. Predominant rhythm: atrial fibrillation       Average heart rate: 68 bpm Max heart rate: 193 bpm Min heart rate: 43 bpm  Occasional PVCs  Patient was in atrial fibrillation throughout the study.   Recent Labs: 12/15/2019: Magnesium 1.8 03/23/2020: TSH 0.86 06/23/2020: ALT 19; BUN 14; Creat 0.73; Hemoglobin 11.9; Platelets 260; Potassium 4.3; Sodium 135    Lipid Panel    Component Value Date/Time   CHOL 228 (H) 06/23/2020 1104   TRIG 163 (H) 06/23/2020 1104   HDL 73 06/23/2020 1104   CHOLHDL 3.1 06/23/2020 1104   VLDL 31 (H) 02/05/2017 1149   LDLCALC 127 (H) 06/23/2020 1104      Wt Readings from Last 3 Encounters:  07/05/20 111 lb 12.8 oz (50.7 kg)  06/23/20 114 lb (51.7 kg)  05/31/20 112 lb 6.4 oz (51 kg)      ASSESSMENT AND PLAN:  # Atherosclerosis of the aorta: # Hyperlipidemia: Agree with resuming lovastatin.  Her LDL increased to 127 when it was discontinued.  Goal LDL is less than 70.  Lipids will be rechecked with her PCP.  She is not on aspirin given that she takes Eliquis.  # Chronic atrial fibrillation:  # Splenic and renal infarcts: HR well-controlled.  Continue atenolol and Eliquis.  #Hypertension: BP well-controlled.  Continue atenolol and olmesartan.  Current medicines are reviewed at length with the patient today.  The patient does not have concerns regarding medicines.  The following changes have been made:  no change  Labs/ tests ordered today include:   No orders of the defined types  were placed in this encounter.    Disposition:   FU with Benita Boonstra C. Oval Linsey, MD, [facility] in about 6 weeks.      Signed, Marialuiza Car C. Oval Linsey, MD, [facility]  07/05/2020 11:51 AM    [place]

## 2020-07-07 ENCOUNTER — Encounter: Payer: Self-pay | Admitting: Adult Health Nurse Practitioner

## 2020-07-07 ENCOUNTER — Ambulatory Visit (INDEPENDENT_AMBULATORY_CARE_PROVIDER_SITE_OTHER): Payer: PPO | Admitting: Adult Health Nurse Practitioner

## 2020-07-07 ENCOUNTER — Other Ambulatory Visit: Payer: Self-pay

## 2020-07-07 VITALS — BP 110/60 | HR 74 | Temp 98.8°F | Wt 115.0 lb

## 2020-07-07 DIAGNOSIS — K5792 Diverticulitis of intestine, part unspecified, without perforation or abscess without bleeding: Secondary | ICD-10-CM

## 2020-07-07 DIAGNOSIS — K643 Fourth degree hemorrhoids: Secondary | ICD-10-CM | POA: Diagnosis not present

## 2020-07-07 DIAGNOSIS — R5381 Other malaise: Secondary | ICD-10-CM | POA: Diagnosis not present

## 2020-07-07 DIAGNOSIS — R1012 Left upper quadrant pain: Secondary | ICD-10-CM | POA: Diagnosis not present

## 2020-07-07 DIAGNOSIS — R1032 Left lower quadrant pain: Secondary | ICD-10-CM | POA: Diagnosis not present

## 2020-07-07 DIAGNOSIS — R5383 Other fatigue: Secondary | ICD-10-CM | POA: Diagnosis not present

## 2020-07-07 MED ORDER — METRONIDAZOLE 500 MG PO TABS: 500.0000 mg | ORAL_TABLET | Freq: Three times a day (TID) | ORAL | 0 refills | Status: AC

## 2020-07-07 MED ORDER — CIPROFLOXACIN HCL 500 MG PO TABS: 500.0000 mg | ORAL_TABLET | Freq: Two times a day (BID) | ORAL | 0 refills | Status: AC

## 2020-07-07 NOTE — Progress Notes (Signed)
Assessment and Plan:  Debra White was seen today for hemorrhoids, facial pain and gi problem.  Diagnoses and all orders for this visit:  LLQ pain Diverticulitis -     ciprofloxacin (CIPRO) 500 MG tablet; Take 1 tablet (500 mg total) by mouth 2 (two) times daily for 7 days. -     metroNIDAZOLE (FLAGYL) 500 MG tablet; Take 1 tablet (500 mg total) by mouth 3 (three) times daily for 7 days.  Malaise and fatigue -     CBC with Differential/Platelet -     COMPLETE METABOLIC PANEL WITH GFR  Grade IV hemorrhoids OTC Preparation H   Discussed hospital precautions with patient.  Further disposition pending results of labs. Discussed med's effects and SE's.   Over 30 minutes of face to face interview, exam, counseling, chart review, and critical decision making was performed.   Future Appointments  Date Time Provider [place]  10/10/2020 10:30 AM Garnet Sierras, NP [facility] None  01/11/2021 10:00 AM Liane Comber, NP [facility] None    ------------------------------------------------------------------------------------------------------------------   HPI 87 y.o.female presents for evaluation of diarrhea.  She reports that every morning she is having abdominal pains that wake her for the past three days.  She reports that she that she is having a few drops of blood in the toilet.  There was also blood on the tissue.  She reports hemidrosis.  She is using Vaseline on this.  Reports she is having cramping in her LLQ and is tender.  She reports this has decreased her appetite and she has been trying to eat foods like sweet potatoes.   Past Medical History:  Diagnosis Date  . Acute on chronic combined systolic and diastolic CHF (congestive heart failure) ([place]) 06/29/2019  . Anxiety   . Arthritis   . Blood transfusion   . C. difficile colitis   . Candida esophagitis ([place]) 2013   EGD  . Cataract   . Chronic kidney disease    STONES  . Diverticulitis 04/05/2017  . Diverticulosis of  colon (without mention of hemorrhage) 2007   Colonoscopy  . Dysrhythmia    RX  . Emphysema of lung ([place])   . Family history of colon cancer    sister  . Fibromyalgia   . Fracture, zygoma closed ([place]) 07/13/2011  . GERD (gastroesophageal reflux disease)   . Headache(784.0)   . Hyperlipemia   . Hypertension   . Kidney stones   . Ptosis, bilateral   . Renal infarct ([place])   . Splenic infarct      Allergies  Allergen Reactions  . Latex Hives    TONGUE HAD BLISTERS WHEN HAD DENTAL SURGERY  . Amoxicillin Nausea And Vomiting  . Aspirin Other (See Comments)    Gi upset  . Barbiturates Other (See Comments)    Unknown reaction  . Celebrex [Celecoxib] Other (See Comments)    Unknown reaction  . Codeine Itching  . Evista [Raloxifene] Other (See Comments)    Unknown reaction  . Lipitor [Atorvastatin] Other (See Comments)    Unknown reaction  . Morphine And Related Other (See Comments)    Unknown reaction  . Oruvail [Ketoprofen] Other (See Comments)    Unknown reaction  . Pantoprazole Diarrhea  . Paraffin Other (See Comments)    Unknown reaction  . Prochlorperazine Other (See Comments)    Uncontrolled shaking  . Proloprim [Trimethoprim] Other (See Comments)    Unknown reaction  . Venlafaxine Nausea Only  . Vibra-Tab [Doxycycline] Other (See Comments)    Unknown  reaction  . Vioxx [Rofecoxib] Other (See Comments)    edema  . Betadine [Povidone Iodine] Rash  . Caffeine Palpitations    Current Outpatient Medications on File Prior to Visit  Medication Sig  . acetaminophen (TYLENOL) 500 MG tablet Take 1,000 mg by mouth every 8 (eight) hours as needed for headache.   Marland Kitchen apixaban (ELIQUIS) 2.5 MG TABS tablet Take 2.5 mg by mouth 2 (two) times daily. 1 Tablet Twice Daily  . atenolol (TENORMIN) 100 MG tablet Take 1 tablet     Daily     for BP (Patient taking differently: Take 100 mg by mouth daily.)  . Cholecalciferol (VITAMIN D-3) 125 MCG (5000 UT) TABS Take 5,000 Units by mouth  daily.  . clonazePAM (KLONOPIN) 1 MG tablet Take       1/2 - 1 tablet         2 - 3 x /day         with Meals  ONLY        if needed for Anxiety Attack &  limit to 5 days /week to avoid Addiction & Dementia  . conjugated estrogens (PREMARIN) vaginal cream Place 1 Applicatorful vaginally See admin instructions. Apply one applicatorful vaginally once a week  . Cyanocobalamin (VITAMIN B 12 PO) Take 1 tablet by mouth daily.  . diclofenac Sodium (VOLTAREN) 1 % GEL Apply 2 grams 3 to 4 x / day as needed for Pain (Patient taking differently: Apply 2 g topically 2 (two) times daily as needed (pain).)  . dicyclomine (BENTYL) 20 MG tablet Take     1 tablet     4 x /day     before Meals & Bedtime     If needed for Cramping, Bloating, Nausea or Diarrhea.  . gabapentin (NEURONTIN) 600 MG tablet TAKE 1/2 TO 1 TABLET BY MOUTH 3 TIMES DAILY FOR FACIAL PAIN (Patient taking differently: Take 300 mg by mouth 3 (three) times daily.)  . lovastatin (MEVACOR) 20 MG tablet Take 1 tablet at Bedtime for Cholesterol  . mirtazapine (REMERON) 15 MG tablet Take 1 tablet at Bedtime for Appetite  . olmesartan (BENICAR) 40 MG tablet Take     1 tablet     Daily       for BP  . omeprazole (PRILOSEC) 40 MG capsule Take 1 capsule (40 mg total) by mouth daily before breakfast. for Indigestion & Heartburn (Patient taking differently: Take 40 mg by mouth daily before breakfast.)  . polyvinyl alcohol (LIQUIFILM TEARS) 1.4 % ophthalmic solution Place 1 drop into both eyes 2 (two) times daily as needed for dry eyes.   No current facility-administered medications on file prior to visit.    ROS: all negative except above.   Physical Exam:  BP 110/60   Pulse 74   Temp 98.8 F (37.1 C)   Wt 115 lb (52.2 kg)   SpO2 95%   BMI 19.74 kg/m   General Appearance: Well nourished, in no apparent distress. Eyes: PERRLA, EOMs, conjunctiva no swelling or erythema Sinuses: No Frontal/maxillary tenderness ENT/Mouth: Ext aud canals clear, TMs  without erythema, bulging. No erythema, swelling, or exudate on post pharynx.  Tonsils not swollen or erythematous. Hearing normal.  Neck: Supple, thyroid normal.  Respiratory: Respiratory effort normal, BS equal bilaterally without rales, rhonchi, wheezing or stridor.  Cardio: RRR with no MRGs. Brisk peripheral pulses without edema.  Abdomen: Soft, + BS.  Tenderness to LLQ, Non tender remaining quadrants., no guarding, rebound, hernias, masses. Rectal: single grade IV  hemorrhoid, noted. No erythema or blood noted. Lymphatics: Non tender without lymphadenopathy.  Musculoskeletal: Full ROM, 5/5 strength, normal gait.  Skin: Warm, dry without rashes, lesions, ecchymosis.  Neuro: Cranial nerves intact. Normal muscle tone, no cerebellar symptoms. Sensation intact.  Psych: Awake and oriented X 3, normal affect, Insight and Judgment appropriate.       Garnet Sierras, Laqueta Jean, DNP [facility] 07/07/2020  10:56 AM

## 2020-07-07 NOTE — Patient Instructions (Signed)
Get some Preparation H from any pharmacy.  This will help with the hemorrhoid.    We will send in Ciprofloxacin and flagyl antibiotics.  This treats diverticulitis, inflammation in your intestines.     Start on cipro/flaygl for diverticulitis Do liquid diet for a few days with jello/brothes then go to bowel rest Bowel rest means no wheat/fiber, so please eat white stuff like potatoes, soup, crackers until you are feeling better and then slowly advance your diet back to veggies and fiber like wheat.    Can do citracel or benefiber or add on fiber supplement to prevent flare of diverticulitis AFTER this has resolved. If you do have a flare such as left lower quadrant pain, diarrhea avoid fiber, do bowel rest which involves liquid diet at first with broth and jello and then slowly advance your diet to bland foods such as potatoes, noodles, etc and then add back in fiber.     Diverticulitis Diverticulitis is inflammation or infection of small pouches in your colon that form when you have a condition called diverticulosis. The pouches in your colon are called diverticula. Your colon, or large intestine, is where water is absorbed and stool is formed. Complications of diverticulitis can include:  Bleeding.  Severe infection.  Severe pain.  Perforation of your colon.  Obstruction of your colon.  What are the causes? Diverticulitis is caused by bacteria. Diverticulitis happens when stool becomes trapped in diverticula. This allows bacteria to grow in the diverticula, which can lead to inflammation and infection. What increases the risk? People with diverticulosis are at risk for diverticulitis. Eating a diet that does not include enough fiber from fruits and vegetables may make diverticulitis more likely to develop. What are the signs or symptoms? Symptoms of diverticulitis may include:  Abdominal pain and tenderness. The pain is normally located on the left side of the abdomen,  but may occur in other areas.  Fever and chills.  Bloating.  Cramping.  Nausea.  Vomiting.  Constipation.  Diarrhea.  Blood in your stool.  How is this diagnosed? Your health care provider will ask you about your medical history and do a physical exam. You may need to have tests done because many medical conditions can cause the same symptoms as diverticulitis. Tests may include:  Blood tests.  Urine tests.  Imaging tests of the abdomen, including X-rays and CT scans.  When your condition is under control, your health care provider may recommend that you have a colonoscopy. A colonoscopy can show how severe your diverticula are and whether something else is causing your symptoms. How is this treated? Most cases of diverticulitis are mild and can be treated at home. Treatment may include:  Taking over-the-counter pain medicines.  Following a clear liquid diet.  Taking antibiotic medicines by mouth for 7-10 days.  More severe cases may be treated at a hospital. Treatment may include:  Not eating or drinking.  Taking prescription pain medicine.  Receiving antibiotic medicines through an IV tube.  Receiving fluids and nutrition through an IV tube.  Surgery.  Follow these instructions at home:  Follow your health care provider's instructions carefully.  Follow a full liquid diet or other diet as directed by your health care provider. After your symptoms improve, your health care provider may tell you to change your diet. He or she may recommend you eat a high-fiber diet. Fruits and vegetables are good sources of fiber. Fiber makes it easier to pass stool.  Take fiber supplements or  probiotics as directed by your health care provider.  Only take medicines as directed by your health care provider.  Keep all your follow-up appointments. Contact a health care provider if:  Your pain does not improve.  You have a hard time eating food.  Your bowel movements do  not return to normal. Get help right away if:  Your pain becomes worse.  Your symptoms do not get better.  Your symptoms suddenly get worse.  You have a fever.  You have repeated vomiting.  You have bloody or black, tarry stools. This information is not intended to replace advice given to you by your health care provider. Make sure you discuss any questions you have with your health care provider. Document Released: 03/21/2005 Document Revised: 11/17/2015 Document Reviewed: 05/06/2013 Elsevier Interactive Patient Education  2017 [employer].

## 2020-07-08 LAB — CBC WITH DIFFERENTIAL/PLATELET
Absolute Monocytes: 1404 cells/uL — ABNORMAL HIGH (ref 200–950)
Basophils Absolute: 28 cells/uL (ref 0–200)
Basophils Relative: 0.2 %
Eosinophils Absolute: 292 cells/uL (ref 15–500)
Eosinophils Relative: 2.1 %
HCT: 37.3 % (ref 35.0–45.0)
Hemoglobin: 12.7 g/dL (ref 11.7–15.5)
Lymphs Abs: 2572 cells/uL (ref 850–3900)
MCH: 31.4 pg (ref 27.0–33.0)
MCHC: 34 g/dL (ref 32.0–36.0)
MCV: 92.1 fL (ref 80.0–100.0)
MPV: 10.2 fL (ref 7.5–12.5)
Monocytes Relative: 10.1 %
Neutro Abs: 9605 cells/uL — ABNORMAL HIGH (ref 1500–7800)
Neutrophils Relative %: 69.1 %
Platelets: 280 10*3/uL (ref 140–400)
RBC: 4.05 10*6/uL (ref 3.80–5.10)
RDW: 13.1 % (ref 11.0–15.0)
Total Lymphocyte: 18.5 %
WBC: 13.9 10*3/uL — ABNORMAL HIGH (ref 3.8–10.8)

## 2020-07-08 LAB — COMPLETE METABOLIC PANEL WITH GFR
AG Ratio: 1.6 (calc) (ref 1.0–2.5)
ALT: 17 U/L (ref 6–29)
AST: 16 U/L (ref 10–35)
Albumin: 4.1 g/dL (ref 3.6–5.1)
Alkaline phosphatase (APISO): 80 U/L (ref 37–153)
BUN: 23 mg/dL (ref 7–25)
CO2: 32 mmol/L (ref 20–32)
Calcium: 9.1 mg/dL (ref 8.6–10.4)
Chloride: 95 mmol/L — ABNORMAL LOW (ref 98–110)
Creat: 0.85 mg/dL (ref 0.60–0.88)
GFR, Est African American: 73 mL/min/{1.73_m2} (ref >=60)
GFR, Est Non African American: 63 mL/min/{1.73_m2} (ref >=60)
Globulin: 2.6 g/dL (calc) (ref 1.9–3.7)
Glucose, Bld: 76 mg/dL (ref 65–99)
Potassium: 4.9 mmol/L (ref 3.5–5.3)
Sodium: 132 mmol/L — ABNORMAL LOW (ref 135–146)
Total Bilirubin: 0.4 mg/dL (ref 0.2–1.2)
Total Protein: 6.7 g/dL (ref 6.1–8.1)

## 2020-07-10 ENCOUNTER — Emergency Department (HOSPITAL_COMMUNITY)
Admission: EM | Admit: 2020-07-10 | Discharge: 2020-07-10 | Disposition: A | Discharge status: Home / Self Care | Payer: PPO | Attending: Emergency Medicine | Admitting: Emergency Medicine

## 2020-07-10 ENCOUNTER — Other Ambulatory Visit: Payer: Self-pay

## 2020-07-10 ENCOUNTER — Encounter (HOSPITAL_COMMUNITY): Payer: Self-pay | Admitting: Emergency Medicine

## 2020-07-10 ENCOUNTER — Emergency Department (HOSPITAL_COMMUNITY): Payer: PPO

## 2020-07-10 DIAGNOSIS — Z7901 Long term (current) use of anticoagulants: Secondary | ICD-10-CM | POA: Insufficient documentation

## 2020-07-10 DIAGNOSIS — R1030 Lower abdominal pain, unspecified: Secondary | ICD-10-CM | POA: Diagnosis not present

## 2020-07-10 DIAGNOSIS — R109 Unspecified abdominal pain: Secondary | ICD-10-CM | POA: Diagnosis not present

## 2020-07-10 DIAGNOSIS — J449 Chronic obstructive pulmonary disease, unspecified: Secondary | ICD-10-CM | POA: Diagnosis not present

## 2020-07-10 DIAGNOSIS — K921 Melena: Secondary | ICD-10-CM

## 2020-07-10 DIAGNOSIS — R1032 Left lower quadrant pain: Secondary | ICD-10-CM

## 2020-07-10 DIAGNOSIS — N189 Chronic kidney disease, unspecified: Secondary | ICD-10-CM | POA: Diagnosis not present

## 2020-07-10 DIAGNOSIS — Z79899 Other long term (current) drug therapy: Secondary | ICD-10-CM | POA: Diagnosis not present

## 2020-07-10 DIAGNOSIS — Z8719 Personal history of other diseases of the digestive system: Secondary | ICD-10-CM | POA: Diagnosis not present

## 2020-07-10 DIAGNOSIS — I5043 Acute on chronic combined systolic (congestive) and diastolic (congestive) heart failure: Secondary | ICD-10-CM | POA: Insufficient documentation

## 2020-07-10 DIAGNOSIS — R1084 Generalized abdominal pain: Secondary | ICD-10-CM | POA: Diagnosis not present

## 2020-07-10 DIAGNOSIS — R58 Hemorrhage, not elsewhere classified: Secondary | ICD-10-CM | POA: Diagnosis not present

## 2020-07-10 DIAGNOSIS — K219 Gastro-esophageal reflux disease without esophagitis: Secondary | ICD-10-CM | POA: Diagnosis not present

## 2020-07-10 DIAGNOSIS — K625 Hemorrhage of anus and rectum: Secondary | ICD-10-CM | POA: Diagnosis present

## 2020-07-10 DIAGNOSIS — Z91040 Latex allergy status: Secondary | ICD-10-CM | POA: Insufficient documentation

## 2020-07-10 DIAGNOSIS — I10 Essential (primary) hypertension: Secondary | ICD-10-CM | POA: Diagnosis not present

## 2020-07-10 DIAGNOSIS — I130 Hypertensive heart and chronic kidney disease with heart failure and stage 1 through stage 4 chronic kidney disease, or unspecified chronic kidney disease: Secondary | ICD-10-CM | POA: Insufficient documentation

## 2020-07-10 DIAGNOSIS — K5730 Diverticulosis of large intestine without perforation or abscess without bleeding: Secondary | ICD-10-CM | POA: Diagnosis not present

## 2020-07-10 LAB — CBC
HCT: 37 % (ref 36.0–46.0)
HCT: 36.3 % (ref 36.0–46.0)
Hemoglobin: 12.2 g/dL (ref 12.0–15.0)
Hemoglobin: 11.6 g/dL — ABNORMAL LOW (ref 12.0–15.0)
MCH: 31.3 pg (ref 26.0–34.0)
MCH: 30.2 pg (ref 26.0–34.0)
MCHC: 33 g/dL (ref 30.0–36.0)
MCHC: 32 g/dL (ref 30.0–36.0)
MCV: 94.9 fL (ref 80.0–100.0)
MCV: 94.5 fL (ref 80.0–100.0)
Platelets: 252 10*3/uL (ref 150–400)
Platelets: 213 10*3/uL (ref 150–400)
RBC: 3.9 MIL/uL (ref 3.87–5.11)
RBC: 3.84 MIL/uL — ABNORMAL LOW (ref 3.87–5.11)
RDW: 13.6 % (ref 11.5–15.5)
RDW: 13.5 % (ref 11.5–15.5)
WBC: 10.5 10*3/uL (ref 4.0–10.5)
WBC: 8.7 10*3/uL (ref 4.0–10.5)
nRBC: 0 % (ref 0.0–0.2)
nRBC: 0 % (ref 0.0–0.2)

## 2020-07-10 LAB — COMPREHENSIVE METABOLIC PANEL
ALT: 21 U/L (ref 0–44)
AST: 24 U/L (ref 15–41)
Albumin: 3.7 g/dL (ref 3.5–5.0)
Alkaline Phosphatase: 85 U/L (ref 38–126)
Anion gap: 8 (ref 5–15)
BUN: 14 mg/dL (ref 8–23)
CO2: 27 mmol/L (ref 22–32)
Calcium: 8.9 mg/dL (ref 8.9–10.3)
Chloride: 97 mmol/L — ABNORMAL LOW (ref 98–111)
Creatinine, Ser: 0.84 mg/dL (ref 0.44–1.00)
GFR, Estimated: >60 mL/min (ref >60)
Glucose, Bld: 107 mg/dL — ABNORMAL HIGH (ref 70–99)
Potassium: 4.6 mmol/L (ref 3.5–5.1)
Sodium: 132 mmol/L — ABNORMAL LOW (ref 135–145)
Total Bilirubin: 0.7 mg/dL (ref 0.3–1.2)
Total Protein: 6.9 g/dL (ref 6.5–8.1)

## 2020-07-10 LAB — URINALYSIS, ROUTINE W REFLEX MICROSCOPIC
Bilirubin Urine: NEGATIVE
Glucose, UA: NEGATIVE mg/dL
Ketones, ur: NEGATIVE mg/dL
Nitrite: NEGATIVE
Protein, ur: NEGATIVE mg/dL
Specific Gravity, Urine: 1.012 (ref 1.005–1.030)
pH: 7 (ref 5.0–8.0)

## 2020-07-10 LAB — POC OCCULT BLOOD, ED: Fecal Occult Bld: NEGATIVE

## 2020-07-10 LAB — TYPE AND SCREEN
ABO/RH(D): B POS
Antibody Screen: NEGATIVE

## 2020-07-10 MED ORDER — CLONAZEPAM 0.5 MG PO TABS
0.5000 mg | ORAL_TABLET | Freq: Once | ORAL | Status: AC
  Administered 2020-07-10: 0.5 mg via ORAL
  Filled 2020-07-10: qty 1

## 2020-07-10 MED ORDER — IOHEXOL 350 MG/ML SOLN
100.0000 mL | Freq: Once | INTRAVENOUS | Status: AC | PRN
  Administered 2020-07-10: 100 mL via INTRAVENOUS

## 2020-07-10 NOTE — ED Provider Notes (Addendum)
[facility] EMERGENCY DEPARTMENT Provider Note   CSN: [id number] Arrival date & time: 07/10/20  0827     History Chief Complaint  Patient presents with  . Rectal Bleeding  . Abdominal Pain    Debra White is a 87 y.o. female.  The history is provided by the patient.  Rectal Bleeding Quality:  Maroon Amount:  Moderate Duration:  12 hours Timing:  Intermittent Context: hemorrhoids and spontaneously   Context comment:  On antibiotics (cipro/flagyl) for the past few days for presumed diverticulitis. Three episodes of darker stool/blood Relieved by:  Nothing Worsened by:  Nothing Associated symptoms: abdominal pain (LLQ)   Associated symptoms: no dizziness, no epistaxis, no fever, no hematemesis, no light-headedness, no loss of consciousness, no recent illness and no vomiting   Risk factors: anticoagulant use        Past Medical History:  Diagnosis Date  . Acute on chronic combined systolic and diastolic CHF (congestive heart failure) (HCC) 06/29/2019  . Anxiety   . Arthritis   . Blood transfusion   . C. difficile colitis   . Candida esophagitis (HCC) 2013   EGD  . Cataract   . Chronic kidney disease    STONES  . Diverticulitis 04/05/2017  . Diverticulosis of colon (without mention of hemorrhage) 2007   Colonoscopy  . Dysrhythmia    RX  . Emphysema of lung (HCC)   . Family history of colon cancer    sister  . Fibromyalgia   . Fracture, zygoma closed (HCC) 07/13/2011  . GERD (gastroesophageal reflux disease)   . Headache(784.0)   . Hyperlipemia   . Hypertension   . Kidney stones   . Ptosis, bilateral   . Renal infarct (HCC)   . Splenic infarct     Patient Active Problem List   Diagnosis Date Noted  . Anemia 05/31/2020  . Rectal bleeding 05/31/2020  . Left lower quadrant abdominal pain 05/31/2020  . Epigastric discomfort 11/19/2019  . Anxiety 11/19/2019  . Syncope 10/10/2019  . Diastolic CHF (HCC) 06/29/2019  . History of  embolic stroke 06/29/2019  . Atrial fibrillation with controlled ventricular response (HCC) 03/12/2019  . Coronary atherosclerosis due to lipid rich plaque 03/26/2018  . Renal infarct (HCC) 03/22/2018  . Hyponatremia 03/22/2018  . Chronic hyperkalemia 03/22/2018  . Hepatic steatosis 11/17/2017  . Aortic atherosclerosis (HCC) by Abd CT scan (05/2020)  08/08/2017  . COPD (chronic obstructive pulmonary disease) with emphysema (HCC) 12/21/2015  . 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 Osteoporosis 12/21/2015  . Mild malnutrition (HCC) 05/11/2015  . Encounter for Medicare annual wellness exam 04/08/2015  . Medication management 01/27/2014  . 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 Vitamin D deficiency 07/08/2013  . Hypertension   . Hyperlipidemia   . GERD   . Headache, post-traumatic, chronic 09/20/2011    Past Surgical History:  Procedure Laterality Date  . ABDOMINAL HYSTERECTOMY    . BACK SURGERY     X2  . BACK SURGERY    . BREAST EXCISIONAL BIOPSY    . CERVICAL DISC SURGERY    . COLONOSCOPY    . FACIAL FRACTURE SURGERY    . HAND SURGERY     BIL   . ORIF TRIPOD FRACTURE  07/13/2011   Procedure: OPEN REDUCTION INTERNAL FIXATION (ORIF) TRIPOD FRACTURE;  Surgeon: [name], MD;  Location: [facility] OR;  Service: ENT;  Laterality: Right;  ORIF RIGHT ZYGOMA, ORBITAL FLOOR EXPLORATION WITH FROST STITCH (TEMPORARY TARSORRHAPHY)  . PTOSIS REPAIR Bilateral 11/22/2016  Procedure: INTERNAL PTOSIS REPAIR;  Surgeon: Clista Bernhardt, MD;  Location: [place];  Service: Ophthalmology;  Laterality: Bilateral;  . SHOULDER ARTHROSCOPY W/ ROTATOR CUFF REPAIR     LFT     OB History   No obstetric history on file.     Family History  Problem Relation Age of Onset  . Heart disease Mother   . Heart disease Father   . Heart attack Father   . Breast cancer Sister 63  . Colon cancer Sister   . Cancer Sister        breast  . Heart disease Sister   . Cancer Sister        colon  . Cancer Sister         colon  . Hypertension Daughter   . Hyperlipidemia Daughter     Social History   Tobacco Use  . Smoking status: Never Smoker  . Smokeless tobacco: Never Used  Vaping Use  . Vaping Use: Never used  Substance Use Topics  . Alcohol use: No  . Drug use: No    Home Medications Prior to Admission medications   Medication Sig Start Date End Date Taking? Authorizing Provider  acetaminophen (TYLENOL) 500 MG tablet Take 1,000 mg by mouth every 8 (eight) hours as needed for headache.     [provider]  apixaban (ELIQUIS) 2.5 MG TABS tablet Take 2.5 mg by mouth 2 (two) times daily. 1 Tablet Twice Daily    [provider]  atenolol (TENORMIN) 100 MG tablet Take 1 tablet     Daily     for BP Patient taking differently: Take 100 mg by mouth daily. 03/01/20   Unk Pinto, MD  Cholecalciferol (VITAMIN D-3) 125 MCG (5000 UT) TABS Take 5,000 Units by mouth daily.    [provider]  ciprofloxacin (CIPRO) 500 MG tablet Take 1 tablet (500 mg total) by mouth 2 (two) times daily for 7 days. 07/07/20 07/14/20  Garnet Sierras, NP  clonazePAM (KLONOPIN) 1 MG tablet Take       1/2 - 1 tablet         2 - 3 x /day         with Meals  ONLY        if needed for Anxiety Attack &  limit to 5 days /week to avoid Addiction & Dementia 06/24/20   Unk Pinto, MD  conjugated estrogens (PREMARIN) vaginal cream Place 1 Applicatorful vaginally See admin instructions. Apply one applicatorful vaginally once a week    [provider]  Cyanocobalamin (VITAMIN B 12 PO) Take 1 tablet by mouth daily.    [provider]  diclofenac Sodium (VOLTAREN) 1 % GEL Apply 2 grams 3 to 4 x / day as needed for Pain Patient taking differently: Apply 2 g topically 2 (two) times daily as needed (pain). 06/30/19   Unk Pinto, MD  dicyclomine (BENTYL) 20 MG tablet Take     1 tablet     4 x /day     before Meals & Bedtime     If needed for Cramping, Bloating, Nausea or Diarrhea. 06/13/20    Unk Pinto, MD  gabapentin (NEURONTIN) 600 MG tablet TAKE 1/2 TO 1 TABLET BY MOUTH 3 TIMES DAILY FOR FACIAL PAIN Patient taking differently: Take 300 mg by mouth 3 (three) times daily. 11/17/19   Unk Pinto, MD  lovastatin (MEVACOR) 20 MG tablet Take 1 tablet at Bedtime for Cholesterol 06/29/20   Garnet Sierras, NP  metroNIDAZOLE (FLAGYL) 500 MG tablet Take 1 tablet (500 mg total) by mouth 3 (three) times daily for 7 days. 07/07/20 07/14/20  Garnet Sierras, NP  mirtazapine (REMERON) 15 MG tablet Take 1 tablet at Bedtime for Appetite 06/23/20   Garnet Sierras, NP  olmesartan (BENICAR) 40 MG tablet Take     1 tablet     Daily       for BP 07/04/20   Unk Pinto, MD  omeprazole (PRILOSEC) 40 MG capsule Take 1 capsule (40 mg total) by mouth daily before breakfast. for Indigestion & Heartburn Patient taking differently: Take 40 mg by mouth daily before breakfast. 02/18/20   Liane Comber, NP  polyvinyl alcohol (LIQUIFILM TEARS) 1.4 % ophthalmic solution Place 1 drop into both eyes 2 (two) times daily as needed for dry eyes.    [provider]    Allergies    Latex, Amoxicillin, Aspirin, Barbiturates, Celebrex [celecoxib], Codeine, Evista [raloxifene], Lipitor [atorvastatin], Morphine and related, Oruvail [ketoprofen], Pantoprazole, Paraffin, Prochlorperazine, Proloprim [trimethoprim], Venlafaxine, Vibra-tab [doxycycline], Vioxx [rofecoxib], Betadine [povidone iodine], and Caffeine  Review of Systems   Review of Systems  Constitutional: Negative for chills and fever.  HENT: Negative for ear pain, nosebleeds and sore throat.   Eyes: Negative for pain and visual disturbance.  Respiratory: Negative for cough and shortness of breath.   Cardiovascular: Negative for chest pain and palpitations.  Gastrointestinal: Positive for abdominal pain (LLQ), anal bleeding, blood in stool and hematochezia. Negative for constipation, diarrhea, hematemesis, nausea, rectal pain and vomiting.   Genitourinary: Negative for dysuria and hematuria.  Musculoskeletal: Negative for arthralgias and back pain.  Skin: Negative for color change and rash.  Neurological: Negative for dizziness, seizures, loss of consciousness, syncope and light-headedness.  All other systems reviewed and are negative.   Physical Exam Updated Vital Signs  ED Triage Vitals  Enc Vitals Group     BP 07/10/20 0837 125/85     Pulse Rate 07/10/20 0837 78     Resp 07/10/20 0837 16     Temp 07/10/20 0837 98.5 F (36.9 C)     Temp Source 07/10/20 0837 Oral     SpO2 07/10/20 0837 97 %     Weight --      Height --      Head Circumference --      Peak Flow --      Pain Score 07/10/20 0832 8     Pain Loc --      Pain Edu? --      Excl. in [place]? --     Physical Exam Vitals and nursing note reviewed.  Constitutional:      General: She is not in acute distress.    Appearance: She is well-developed and well-nourished. She is not ill-appearing.  HENT:     Head: Normocephalic and atraumatic.     Mouth/Throat:     Mouth: Mucous membranes are moist.  Eyes:     Extraocular Movements: Extraocular movements intact.     Conjunctiva/sclera: Conjunctivae normal.  Cardiovascular:     Rate and Rhythm: Normal rate and regular rhythm.     Heart sounds: Normal heart sounds. No murmur heard.   Pulmonary:     Effort: Pulmonary effort is normal. No respiratory distress.     Breath sounds: Normal breath sounds.  Abdominal:     Palpations: Abdomen is soft.     Tenderness: There is abdominal tenderness in the left lower quadrant. There is no guarding or rebound.  Musculoskeletal:  General: No edema.     Cervical back: Neck supple.  Skin:    General: Skin is warm and dry.     Capillary Refill: Capillary refill takes less than 2 seconds.  Neurological:     General: No focal deficit present.     Mental Status: She is alert.  Psychiatric:        Mood and Affect: Mood and affect normal.     ED Results /  Procedures / Treatments   Labs (all labs ordered are listed, but only abnormal results are displayed) Labs Reviewed  COMPREHENSIVE METABOLIC PANEL - Abnormal; Notable for the following components:      Result Value   Sodium 132 (*)    Chloride 97 (*)    Glucose, Bld 107 (*)    All other components within normal limits  URINALYSIS, ROUTINE W REFLEX MICROSCOPIC - Abnormal; Notable for the following components:   APPearance HAZY (*)    Hgb urine dipstick SMALL (*)    Leukocytes,Ua MODERATE (*)    Bacteria, UA RARE (*)    All other components within normal limits  CBC - Abnormal; Notable for the following components:   RBC 3.84 (*)    Hemoglobin 11.6 (*)    All other components within normal limits  CBC  POC OCCULT BLOOD, ED  TYPE AND SCREEN  ABO/RH    EKG EKG Interpretation  Date/Time:  Sunday July 10 2020 11:54:54 EST Ventricular Rate:  66 PR Interval:    QRS Duration: 79 QT Interval:  393 QTC Calculation: 412 R Axis:   27 Text Interpretation: Sinus rhythm Baseline wander in lead(s) V6 Confirmed by Lennice Sites [phone]) on 07/10/2020 12:44:17 PM   Radiology CT Angio Abd/Pel W and/or Wo Contrast  Result Date: 07/10/2020 CLINICAL DATA:  Lower abdominal pain, dark stools. Possible gastrointestinal bleeding. EXAM: CTA ABDOMEN AND PELVIS WITHOUT AND WITH CONTRAST TECHNIQUE: Multidetector CT imaging of the abdomen and pelvis was performed using the standard protocol during bolus administration of intravenous contrast. Multiplanar reconstructed images and MIPs were obtained and reviewed to evaluate the vascular anatomy. CONTRAST:  150mL OMNIPAQUE IOHEXOL 350 MG/ML SOLN COMPARISON:  June 05, 2019. FINDINGS: VASCULAR Aorta: Atherosclerosis of abdominal aorta is noted without aneurysm or dissection. Celiac: Patent without evidence of aneurysm, dissection, vasculitis or significant stenosis. SMA: Patent without evidence of aneurysm, dissection, vasculitis or significant stenosis.  Renals: 2 right renal arteries are noted, with single left renal artery. No significant renal artery stenosis or thrombosis is noted. IMA: Patent without evidence of aneurysm, dissection, vasculitis or significant stenosis. Inflow: Patent without evidence of aneurysm, dissection, vasculitis or significant stenosis. Proximal Outflow: Bilateral common femoral and visualized portions of the superficial and profunda femoral arteries are patent without evidence of aneurysm, dissection, vasculitis or significant stenosis. Veins: No obvious venous abnormality within the limitations of this arterial phase study. Review of the MIP images confirms the above findings. NON-VASCULAR Lower chest: No acute abnormality. Hepatobiliary: No focal liver abnormality is seen. No gallstones, gallbladder wall thickening, or biliary dilatation. Pancreas: Unremarkable. No pancreatic ductal dilatation or surrounding inflammatory changes. Spleen: Normal in size without focal abnormality. Adrenals/Urinary Tract: Adrenal glands are unremarkable. Kidneys are normal, without renal calculi, focal lesion, or hydronephrosis. Bladder is unremarkable. Stomach/Bowel: The stomach appears normal. Stool is noted throughout the colon. There is no evidence of bowel obstruction or inflammation. The appendix is not visualized. Lymphatic: No significant adenopathy is noted. Reproductive: Status post hysterectomy. No adnexal masses. Other: No abdominal wall hernia or  abnormality. No abdominopelvic ascites. Musculoskeletal: No acute or significant osseous findings. IMPRESSION: VASCULAR Atherosclerosis of abdominal aorta is noted without aneurysm or dissection. No significant mesenteric or renal artery stenosis is noted. No extravasation is noted to suggest active gastrointestinal bleeding. NON-VASCULAR Sigmoid diverticulosis without inflammation. No other abnormality seen in the abdomen or pelvis. Electronically Signed   By: Marijo Conception M.D.   On: 07/10/2020  13:36    Procedures Procedures (including critical care time)  Medications Ordered in ED Medications  iohexol (OMNIPAQUE) 350 MG/ML injection 100 mL (100 mLs Intravenous Contrast Given 07/10/20 1302)    ED Course  I have reviewed the triage vital signs and the nursing notes.  Pertinent labs & imaging results that were available during my care of the patient were reviewed by me and considered in my medical decision making (see chart for details).    MDM Rules/Calculators/A&P                          Debra White is an 87 year old female with history of embolic stroke/paroxysmal A. fib on blood thinner, hypertension, diverticulitis who presents to the ED with abdominal pain, rectal bleeding.  Normal vitals.  No fever.  Patient states 3 darker bowel movements today.  Does not sound like large-volume blood loss.  Stool exam shows mostly brown stool and Hemoccult is negative.  Patient is on Eliquis.  She started antibiotics for presumed diverticulitis several days ago with primary care doctor.  Has not had any large bloody bowel movement since being in the emergency department.  Lab work has already been done prior to my evaluation is overall unremarkable.  IMA globin is 12.2 which is basically unchanged from blood work done 2 days ago.  BUN also normal.  Creatinine normal.  She does endorse tenderness in the left lower quadrant and suspect possibly acute diverticulitis.  She does not have a fever or white count but will get a CT of the abdomen and pelvis to further evaluate.  Patient does have hemorrhoids but there does not appear to be any active bleeding anywhere.  She has had a colonoscopy about a decade ago that showed severe diverticulosis but otherwise was unremarkable.  Repeat hemoglobin is 11.6.  Fairly stable.  Negative Hemoccult.  Brown stool on exam.  CTA with no source of bleeding.  No diverticulitis.  CT abdomen pelvis is overall unremarkable.  She has had no other bowel  movements while here in the ED.  In the end she does not describe any dark or tarry stools.  No bright red bloody stools.  She has some thin darker stools than normal but stool for me is brown and with a negative Hemoccult and stable hemoglobin x2 with unremarkable imaging and do not believe patient has an active GI bleed.  She could have some darker stools at times due to some mild hemorrhoid bleeding she does have some hemorrhoids on exam.  Overall she is hemodynamically stable.  We will have her follow-up with her primary care doctor or her gastroenterologist.  She understands return precautions if she starts to develop any more persistent dark tarry stools or bright red bloody stools.  Discharged in good condition.  Recommend that she continue antibiotics  This chart was dictated using voice recognition software.  Despite best efforts to proofread,  errors can occur which can change the documentation meaning.   Final Clinical Impression(s) / ED Diagnoses Final diagnoses:  Left lower quadrant abdominal  pain  Blood in stool    Rx / DC Orders ED Discharge Orders    None       Lennice Sites, DO 07/10/20 [employer], [place], DO 07/10/20 1616

## 2020-07-10 NOTE — ED Triage Notes (Addendum)
Pt to triage via [facility] from home.  Reports bloody stools and LLQ pain since this morning.

## 2020-07-10 NOTE — Discharge Instructions (Addendum)
Follow-up with your primary care doctor or your GI doctor as well.  Please return if you develop any dark black tarry stools or have bright red bloody bowel movements.  Your work-up today was overall unremarkable as your blood counts were normal and there is no source of bleeding on your exam or on imaging.

## 2020-07-18 ENCOUNTER — Other Ambulatory Visit: Payer: Self-pay | Admitting: Internal Medicine

## 2020-07-26 ENCOUNTER — Other Ambulatory Visit: Payer: Self-pay | Admitting: Internal Medicine

## 2020-07-26 DIAGNOSIS — Z0000 Encounter for general adult medical examination without abnormal findings: Secondary | ICD-10-CM

## 2020-07-28 DIAGNOSIS — R300 Dysuria: Secondary | ICD-10-CM | POA: Diagnosis not present

## 2020-07-28 DIAGNOSIS — N952 Postmenopausal atrophic vaginitis: Secondary | ICD-10-CM | POA: Diagnosis not present

## 2020-08-03 ENCOUNTER — Ambulatory Visit (INDEPENDENT_AMBULATORY_CARE_PROVIDER_SITE_OTHER): Payer: PPO | Admitting: Internal Medicine

## 2020-08-03 ENCOUNTER — Ambulatory Visit: Payer: PPO | Admitting: Adult Health

## 2020-08-03 ENCOUNTER — Other Ambulatory Visit: Payer: Self-pay

## 2020-08-03 VITALS — BP 140/76 | HR 58 | Temp 97.0°F | Resp 16 | Ht 63.5 in | Wt 114.6 lb

## 2020-08-03 DIAGNOSIS — K5792 Diverticulitis of intestine, part unspecified, without perforation or abscess without bleeding: Secondary | ICD-10-CM

## 2020-08-03 MED ORDER — METRONIDAZOLE 250 MG PO TABS: ORAL_TABLET | ORAL | 1 refills | Status: DC

## 2020-08-03 MED ORDER — CIPROFLOXACIN HCL 250 MG PO TABS: ORAL_TABLET | ORAL | 1 refills | Status: DC

## 2020-08-04 ENCOUNTER — Ambulatory Visit: Payer: PPO | Admitting: Internal Medicine

## 2020-08-04 ENCOUNTER — Telehealth: Payer: Self-pay | Admitting: *Deleted

## 2020-08-04 NOTE — Telephone Encounter (Signed)
Daughter called and expressed concern regarding patient's diagnosis of diverticulitis, since 06/2020 CT scan showed diverticulosis. Per Dr [name], patient's exam on 08/03/2020, were consistent with diverticulitis and he prescribed medications based on symptoms. Daughter is aware.

## 2020-08-06 ENCOUNTER — Encounter: Payer: Self-pay | Admitting: Internal Medicine

## 2020-08-06 NOTE — Patient Instructions (Signed)
Diverticulitis  Diverticulitis is infection or inflammation of small pouches (diverticula) in the colon that form due to a condition called diverticulosis. Diverticula can trap stool (feces) and bacteria, causing infection and inflammation. Diverticulitis may cause severe stomach pain and diarrhea. It may lead to tissue damage in the colon that causes bleeding or blockage. The diverticula may also burst (rupture) and cause infected stool to enter other areas of the abdomen. What are the causes? This condition is caused by stool becoming trapped in the diverticula, which allows bacteria to grow in the diverticula. This leads to inflammation and infection. What increases the risk? You are more likely to develop this condition if you have diverticulosis. The risk increases if you:  Are overweight or obese.  Do not get enough exercise.  Drink alcohol.  Use tobacco products.  Eat a diet that has a lot of red meat such as beef, pork, or lamb.  Eat a diet that does not include enough fiber. High-fiber foods include fruits, vegetables, beans, nuts, and whole grains.  Are over 40 years of age. What are the signs or symptoms? Symptoms of this condition may include:  Pain and tenderness in the abdomen. The pain is normally located on the left side of the abdomen, but it may occur in other areas.  Fever and chills.  Nausea.  Vomiting.  Cramping.  Bloating.  Changes in bowel routines.  Blood in your stool. How is this diagnosed? This condition is diagnosed based on:  Your medical history.  A physical exam.  Tests to make sure there is nothing else causing your condition. These tests may include: ? Blood tests. ? Urine tests. ? CT scan of the abdomen. How is this treated? Most cases of this condition are mild and can be treated at home. Treatment may include:  Taking over-the-counter pain medicines.  Following a clear liquid diet.  Taking antibiotic medicines by  mouth.  Resting. More severe cases may need to be treated at a hospital. Treatment may include:  Not eating or drinking.  Taking prescription pain medicine.  Receiving antibiotic medicines through an IV.  Receiving fluids and nutrition through an IV.  Surgery. When your condition is under control, your health care provider may recommend that you have a colonoscopy. This is an exam to look at the entire large intestine. During the exam, a lubricated, bendable tube is inserted into the anus and then passed into the rectum, colon, and other parts of the large intestine. A colonoscopy can show how severe your diverticula are and whether something else may be causing your symptoms. Follow these instructions at home: Medicines  Take over-the-counter and prescription medicines only as told by your health care provider. These include fiber supplements, probiotics, and stool softeners.  If you were prescribed an antibiotic medicine, take it as told by your health care provider. Do not stop taking the antibiotic even if you start to feel better.  Ask your health care provider if the medicine prescribed to you requires you to avoid driving or using machinery. Eating and drinking  Follow a full liquid diet or another diet as directed by your health care provider.  After your symptoms improve, your health care provider may tell you to change your diet. He or she may recommend that you eat a diet that contains at least 25 grams (25 g) of fiber daily. Fiber makes it easier to pass stool. Healthy sources of fiber include: ? Berries. One cup contains 4-8 grams of fiber. ? Beans   Beans or lentils. One-half cup contains 5-8 grams of fiber. ? Green vegetables. One cup contains 4 grams of fiber.  Avoid eating red meat.  General instructions  Do not use any products that contain nicotine or tobacco, such as cigarettes, e-cigarettes, and chewing tobacco. If you need help quitting, ask your health care  provider.  Exercise for at least 30 minutes, 3 times each week. You should exercise hard enough to raise your heart rate and break a sweat.  Keep all follow-up visits as told by your health care provider. This is important. You may need to have a colonoscopy. Contact a health care provider if:  Your pain does not improve.  Your bowel movements do not return to normal. Get help right away if:  Your pain gets worse.  Your symptoms do not get better with treatment.  Your symptoms suddenly get worse.  You have a fever.  You vomit more than one time.  You have stools that are bloody, black, or tarry. Summary  Diverticulitis is infection or inflammation of small pouches (diverticula) in the colon that form due to a condition called diverticulosis. Diverticula can trap stool (feces) and bacteria, causing infection and inflammation.  You are at higher risk for this condition if you have diverticulosis and you eat a diet that does not include enough fiber.  Most cases of this condition are mild and can be treated at home. More severe cases may need to be treated at a hospital.  When your condition is under control, your health care provider may recommend that you have an exam called a colonoscopy. This exam can show how severe your diverticula are and whether something else may be causing your symptoms.  Keep all follow-up visits as told by your health care provider. This is important.  

## 2020-08-06 NOTE — Progress Notes (Signed)
History of Present Illness:     Patient is a very nice 87 yo MWF with hx/o ER evaluation in Dec 2021 for Abd pain & Abd CT scan showed diverticulosis w/o diverticulitis. Today she presents with persistent LLQ pain and discomfort w/o N/V, fever, chills, rash, upper GI sx's, melena or Hematochezia.   Medications  .  atenolol (TENORMIN) 100 MG tablet, Take   1 tablet   Daily for BP .  lovastatin (MEVACOR) 20 MG tablet, Take 1 tablet at Bedtime for Cholesterol .  olmesartan (BENICAR) 40 MG tablet, Take     1 tablet     Daily       for BP  .  acetaminophen (TYLENOL) 500 MG tablet, Take 1,000 mg by mouth every 8 (eight) hours as needed for headache.   Marland Kitchen  apixaban (ELIQUIS) 2.5 MG TABS tablet, Take 2.5 mg by mouth 2 (two) times daily. 1 Tablet Twice Daily .  Cyanocobalamin (VITAMIN B 12 PO), Take 1 tablet by mouth daily.  .  Cholecalciferol (VITAMIN D-3) 125 MCG (5000 UT) TABS, Take 5,000 Units by mouth daily. .  clonazePAM  1 MG , Take 1/2 - 1 tablet  2 - 3 x /day with Meals  ONLY if needed  .  PREMARIN vaginal crm, Apply one applicatorful vaginally once a week .  diclofenac  1 % GEL, Apply 2 grams 3 to 4 x / day as needed for Pain  .  dicyclomine 20 MG t, Take  1 tablet4 x /day before Meals & Bedtime      .  gabapentin  600 MG , TAKE  3 TIMES DAILY FOR FACIAL PAIN .  REMERON 15 MG , Take 1 tablet at Bedtime for Appetite .  PRILOSEC 40 MG , Take 1 capsule daily .LIQUIFILM TEARS 1.4 % ophth soln, Place 1 drop into  eyes 2  times daily as needed   Problem list She has Headache, post-traumatic, chronic; Hypertension; Hyperlipidemia; GERD; Vitamin D deficiency; Atypical facial pain; Medication management; Encounter for Medicare annual wellness exam; Mild malnutrition ([place]); COPD (chronic obstructive pulmonary disease) with emphysema ([place]); Tortuous aorta ([place]); Osteoporosis; Aortic atherosclerosis ([place]) by Abd CT scan (05/2020) ; Hepatic steatosis; Renal infarct ([place]); Hyponatremia; Chronic  hyperkalemia; Coronary atherosclerosis due to lipid rich plaque; Atrial fibrillation with controlled ventricular response ([place]); Diastolic CHF ([place]); History of embolic stroke; Syncope; Epigastric discomfort; Anxiety; Anemia; Rectal bleeding; and Left lower quadrant abdominal pain on their problem list.   Observations/Objective:  BP 140/76   P 58  T97 F  R 16  Ht 5' 3.5"  Wt 114 lb 9.6 oz  SpO2 98%  BMI 19.98   HEENT - WNL. Neck - supple.  Chest - Clear equal BS. Cor - Nl HS. RRR w/o sig MGR. PP 1(+). No edema. Abd - Sof with LLQ tenderness w/o G or RB & BS Nl.  MS- FROM w/o deformities.  Gait Nl. Neuro -  Nl w/o focal abnormalities. Skin - No rash, cyanosis, icterus or clubbing.  Assessment and Plan:  1. Diverticulitis  - ciprofloxacin  250 MG; Take  1 tablet 2 x /dayWith Meals Disp: 20 tablet; Refill: 1  - metronidazole 250 MG; Take  1 tablet 3 x /daywith Meals s  Disp: 30 tablet; Refill: 1   Follow Up Instructions:      I discussed the assessment and treatment plan with the patient. The patient was provided an opportunity to ask questions and all were answered. The patient agreed with  the plan and demonstrated an understanding of the instructions.       The patient was advised to call back or seek an in-person evaluation if the symptoms worsen or if the condition fails to improve as anticipated.   Kirtland Bouchard, MD

## 2020-08-16 ENCOUNTER — Other Ambulatory Visit: Payer: Self-pay | Admitting: Adult Health

## 2020-08-16 DIAGNOSIS — K2100 Gastro-esophageal reflux disease with esophagitis, without bleeding: Secondary | ICD-10-CM

## 2020-09-08 ENCOUNTER — Inpatient Hospital Stay: Admission: RE | Admit: 2020-09-08 | Payer: PPO | Source: Ambulatory Visit

## 2020-09-08 ENCOUNTER — Ambulatory Visit
Admission: RE | Admit: 2020-09-08 | Discharge: 2020-09-08 | Disposition: A | Discharge status: Home / Self Care | Payer: PPO | Source: Ambulatory Visit | Attending: Internal Medicine | Admitting: Internal Medicine

## 2020-09-08 ENCOUNTER — Other Ambulatory Visit: Payer: Self-pay

## 2020-09-08 DIAGNOSIS — Z0000 Encounter for general adult medical examination without abnormal findings: Secondary | ICD-10-CM

## 2020-09-08 DIAGNOSIS — Z1231 Encounter for screening mammogram for malignant neoplasm of breast: Secondary | ICD-10-CM | POA: Diagnosis not present

## 2020-09-09 ENCOUNTER — Other Ambulatory Visit: Payer: Self-pay | Admitting: Internal Medicine

## 2020-09-09 DIAGNOSIS — R928 Other abnormal and inconclusive findings on diagnostic imaging of breast: Secondary | ICD-10-CM

## 2020-09-15 ENCOUNTER — Ambulatory Visit: Payer: PPO | Admitting: Adult Health Nurse Practitioner

## 2020-09-27 ENCOUNTER — Other Ambulatory Visit: Payer: Self-pay | Admitting: Internal Medicine

## 2020-09-27 DIAGNOSIS — I10 Essential (primary) hypertension: Secondary | ICD-10-CM

## 2020-09-28 ENCOUNTER — Other Ambulatory Visit: Payer: PPO

## 2020-09-29 ENCOUNTER — Other Ambulatory Visit: Payer: Self-pay

## 2020-09-29 ENCOUNTER — Ambulatory Visit
Admission: RE | Admit: 2020-09-29 | Discharge: 2020-09-29 | Disposition: A | Discharge status: Home / Self Care | Payer: PPO | Source: Ambulatory Visit | Attending: Internal Medicine | Admitting: Internal Medicine

## 2020-09-29 DIAGNOSIS — N6011 Diffuse cystic mastopathy of right breast: Secondary | ICD-10-CM | POA: Diagnosis not present

## 2020-09-29 DIAGNOSIS — N6489 Other specified disorders of breast: Secondary | ICD-10-CM | POA: Diagnosis not present

## 2020-09-29 DIAGNOSIS — R928 Other abnormal and inconclusive findings on diagnostic imaging of breast: Secondary | ICD-10-CM

## 2020-10-05 ENCOUNTER — Other Ambulatory Visit: Payer: Self-pay | Admitting: Internal Medicine

## 2020-10-10 ENCOUNTER — Ambulatory Visit: Payer: PPO | Admitting: Adult Health Nurse Practitioner

## 2020-10-13 ENCOUNTER — Telehealth: Payer: Self-pay | Admitting: Cardiovascular Disease

## 2020-10-13 NOTE — Telephone Encounter (Signed)
   Pt is requesting to switch provider from Dr. Oval Linsey to Dr. Sallyanne Kuster, she received a letter that that Dr. Oval Linsey is moving to new location and he wanted to stay at [place] office. Her husband is a pt of Dr. Loletha Grayer she wanted to have same doctor

## 2020-10-13 NOTE — Telephone Encounter (Signed)
No objections

## 2020-10-17 ENCOUNTER — Other Ambulatory Visit: Payer: Self-pay | Admitting: Internal Medicine

## 2020-10-17 DIAGNOSIS — G500 Trigeminal neuralgia: Secondary | ICD-10-CM

## 2020-10-18 ENCOUNTER — Other Ambulatory Visit: Payer: Self-pay | Admitting: Internal Medicine

## 2020-10-20 ENCOUNTER — Other Ambulatory Visit: Payer: Self-pay

## 2020-10-20 ENCOUNTER — Other Ambulatory Visit: Payer: Self-pay | Admitting: Internal Medicine

## 2020-10-20 ENCOUNTER — Ambulatory Visit (INDEPENDENT_AMBULATORY_CARE_PROVIDER_SITE_OTHER): Payer: PPO | Admitting: Adult Health

## 2020-10-20 ENCOUNTER — Encounter: Payer: Self-pay | Admitting: Adult Health

## 2020-10-20 VITALS — BP 140/64 | HR 64 | Temp 97.3°F | Wt 117.0 lb

## 2020-10-20 DIAGNOSIS — I771 Stricture of artery: Secondary | ICD-10-CM

## 2020-10-20 DIAGNOSIS — K219 Gastro-esophageal reflux disease without esophagitis: Secondary | ICD-10-CM

## 2020-10-20 DIAGNOSIS — K760 Fatty (change of) liver, not elsewhere classified: Secondary | ICD-10-CM

## 2020-10-20 DIAGNOSIS — I4891 Unspecified atrial fibrillation: Secondary | ICD-10-CM

## 2020-10-20 DIAGNOSIS — E559 Vitamin D deficiency, unspecified: Secondary | ICD-10-CM

## 2020-10-20 DIAGNOSIS — R6889 Other general symptoms and signs: Secondary | ICD-10-CM | POA: Diagnosis not present

## 2020-10-20 DIAGNOSIS — J439 Emphysema, unspecified: Secondary | ICD-10-CM

## 2020-10-20 DIAGNOSIS — I2583 Coronary atherosclerosis due to lipid rich plaque: Secondary | ICD-10-CM

## 2020-10-20 DIAGNOSIS — Z79899 Other long term (current) drug therapy: Secondary | ICD-10-CM

## 2020-10-20 DIAGNOSIS — N280 Ischemia and infarction of kidney: Secondary | ICD-10-CM | POA: Diagnosis not present

## 2020-10-20 DIAGNOSIS — Z0000 Encounter for general adult medical examination without abnormal findings: Secondary | ICD-10-CM

## 2020-10-20 DIAGNOSIS — E441 Mild protein-calorie malnutrition: Secondary | ICD-10-CM | POA: Diagnosis not present

## 2020-10-20 DIAGNOSIS — I5032 Chronic diastolic (congestive) heart failure: Secondary | ICD-10-CM

## 2020-10-20 DIAGNOSIS — E782 Mixed hyperlipidemia: Secondary | ICD-10-CM | POA: Diagnosis not present

## 2020-10-20 DIAGNOSIS — R7309 Other abnormal glucose: Secondary | ICD-10-CM

## 2020-10-20 DIAGNOSIS — Z0001 Encounter for general adult medical examination with abnormal findings: Secondary | ICD-10-CM | POA: Diagnosis not present

## 2020-10-20 DIAGNOSIS — M810 Age-related osteoporosis without current pathological fracture: Secondary | ICD-10-CM

## 2020-10-20 DIAGNOSIS — I10 Essential (primary) hypertension: Secondary | ICD-10-CM

## 2020-10-20 DIAGNOSIS — D6859 Other primary thrombophilia: Secondary | ICD-10-CM

## 2020-10-20 DIAGNOSIS — I2510 Atherosclerotic heart disease of native coronary artery without angina pectoris: Secondary | ICD-10-CM

## 2020-10-20 DIAGNOSIS — D649 Anemia, unspecified: Secondary | ICD-10-CM

## 2020-10-20 DIAGNOSIS — E875 Hyperkalemia: Secondary | ICD-10-CM

## 2020-10-20 DIAGNOSIS — I700 Atherosclerosis of aorta: Secondary | ICD-10-CM

## 2020-10-20 DIAGNOSIS — F419 Anxiety disorder, unspecified: Secondary | ICD-10-CM

## 2020-10-20 DIAGNOSIS — Z8673 Personal history of transient ischemic attack (TIA), and cerebral infarction without residual deficits: Secondary | ICD-10-CM

## 2020-10-20 MED ORDER — ATENOLOL 100 MG PO TABS: ORAL_TABLET | ORAL | 3 refills | Status: DC

## 2020-10-20 NOTE — Progress Notes (Signed)
MEDICARE ANNUAL WELLNESS VISIT AND 3 MONTH FOLLOW UP  Assessment:   Encounter for Medicare annual wellness exam Due annually   Tortuous aorta ([place]) Control blood pressure, cholesterol, glucose, increase exercise.   Renal infarct ([place]) Continue on elliquis  Atrial fibrillation with controlled ventricular response (HCC)/ thombophilia (HCC) Continue elliquis, she is rate controlled, continue follow up cardio No concerns with bleeding  Aortic atherosclerosis (HCC) Control blood pressure, cholesterol, glucose, increase exercise.   Chronic diastolic congestive heart failure (HCC) Monitor, no symptoms of fluid overload at this time, patient having weight loss, no swelling, no PND, orthopenia  Pulmonary emphysema, unspecified emphysema type ([place]) Per CXR; likely age related; denies sx; monitor  Mild malnutrition ([place]) Resume remeron PRN appetite if weights trending back down  -     COMPLETE METABOLIC PANEL WITH GFR  Essential hypertension -     CBC with Differential/Platelet -     COMPLETE METABOLIC PANEL WITH GFR -     TSH - continue medications, DASH diet, exercise and monitor at home. Call if greater than 130/80.   Coronary atherosclerosis due to lipid rich plaque Control blood pressure, cholesterol, glucose, increase exercise.   Vitamin D deficiency Continue supplement  Medication management -     Magnesium  Mixed hyperlipidemia -     Lipid panel check lipids decrease fatty foods increase activity.    History of embolic stroke Continue eliquis Control blood pressure, cholesterol, glucose, increase exercise.   Chronic post-traumatic headache, not intractable Continue klonopin, try to limit use Gabapentin helpful  Atypical facial pain Gabapentin helpful   Hepatic steatosis Check labs, avoid tylenol, alcohol   Gastroesophageal reflux disease, unspecified whether esophagitis present Continue PPI/H2 blocker, diet discussed  Osteoporosis, unspecified  osteoporosis type, unspecified pathological fracture presence Due for DEXA, discussed and order placed, patient given number to schedule Continue Vit D and high calcium diet, weight bearing exercises encouraged  Abnormal glucose/ Prediabetes Discussed disease and risks Discussed diet/exercise, weight management  A1C q49m, check CMP    Over 40 minutes of exam, counseling, chart review and critical decision making was performed Future Appointments  Date Time Provider [place]  01/26/2021  9:40 AM Croitoru, Dani Gobble, MD [facility]  04/28/2021 10:00 AM Liane Comber, NP [facility] None  10/20/2021 11:30 AM Liane Comber, NP [facility] None     Plan:   During the course of the visit the patient was educated and counseled about appropriate screening and preventive services including:    Pneumococcal vaccine   Prevnar 13  Influenza vaccine  Td vaccine  Screening electrocardiogram  Bone densitometry screening  Colorectal cancer screening  Diabetes screening  Glaucoma screening  Nutrition counseling   Advanced directives: requested   Subjective:  Debra White is a 87 y.o. female who presents for Medicare Annual Wellness Vist and 3 Month follow up.   Husband has dementia, she is primary caregiver for husband.   She reports GERD is well controlled by omeprazole 20 mg daily. Recently had diverticulitis flare and was treated by cipro/flagyl, reports sx fully resolved. Uses bentyl occasionally for cramping and diarrhea.   She has residual face pain related to fall and zygomatic fracture with hardware implanted, manages pain with gabapentin 600 mg TID.   BMI is Body mass index is 20.4 kg/m., she is working on diet, minimal intentional exercise but active as caregiver. Uses remeron intermittently to help with appetite and maintain weight.  Wt Readings from Last 3 Encounters:  10/20/20 117 lb (53.1 kg)  08/03/20 114  lb 9.6 oz (52 kg)  07/07/20 115 lb  (52.2 kg)   She had splenic and renal infarcts in 2018, history of CVA and on Eliquis 2.5 mg BID for Afib.  CAD, diastolic CHF, Echo 6237 EF 55-60% with severe LAE. Declines TEE and loop recorder, follows with cardiology Dr. Oval Linsey. She also has aortic atherosclerosis per CT 05/2020.    Her blood pressure has been controlled at home, today their BP is BP: 140/64 She does not workout. She denies chest pain, shortness of breath, dizziness.   She is on cholesterol medication (lovastatin 20 mg daily) and denies myalgias at the current dose, had myalgias with higher doses. Her cholesterol is not at goal. The cholesterol last visit was:   Lab Results  Component Value Date   CHOL 228 (H) 06/23/2020   HDL 73 06/23/2020   LDLCALC 127 (H) 06/23/2020   TRIG 163 (H) 06/23/2020   CHOLHDL 3.1 06/23/2020   She has prediabetes.  has not been working on diet and exercise for prediabetes, and denies hyperglycemia, hypoglycemia , nausea, polydipsia, polyuria and visual disturbances. Last A1C in the office was:  Lab Results  Component Value Date   HGBA1C 5.7 (H) 03/23/2020   Last GFR: Lab Results  Component Value Date   GFRNONAA >60 07/10/2020   Patient is on Vitamin D supplement.   Lab Results  Component Value Date   VD25OH 93 12/15/2019    Medication Review:   Current Outpatient Medications (Cardiovascular):  .  atenolol (TENORMIN) 100 MG tablet, Take  1 tablet  Daily  for BP .  lovastatin (MEVACOR) 20 MG tablet, Take 1 tablet at Bedtime for Cholesterol .  olmesartan (BENICAR) 40 MG tablet, Take  1 tablet  Daily for BP   Current Outpatient Medications (Analgesics):  .  acetaminophen (TYLENOL) 500 MG tablet, Take 1,000 mg by mouth every 8 (eight) hours as needed for headache.   Current Outpatient Medications (Hematological):  .  apixaban (ELIQUIS) 2.5 MG TABS tablet, Take 2.5 mg by mouth 2 (two) times daily. 1 Tablet Twice Daily .  Cyanocobalamin (VITAMIN B 12 PO), Take 1 tablet by mouth  daily.  Current Outpatient Medications (Other):  .  conjugated estrogens (PREMARIN) vaginal cream, Place 1 Applicatorful vaginally See admin instructions. Apply one applicatorful vaginally once a week .  Cholecalciferol (VITAMIN D-3) 125 MCG (5000 UT) TABS, Take 5,000 Units by mouth daily. .  clonazePAM (KLONOPIN) 1 MG tablet, TAKE 1/2 TO 1 TABLET BY MOUTH 2-3 TIMES DAILY AS NEEDED FOR ANXIETY ATTACKS LIMIT TO 5 DAYS A WEEK TO AVOID ADDICTION AND DEMENTIA .  diclofenac Sodium (VOLTAREN) 1 % GEL, APPLY 2 GRAMS TOPICALLY 3-4 TIMES DAILY AS NEEDED FOR PAIN .  dicyclomine (BENTYL) 20 MG tablet, Take     1 tablet     4 x /day     before Meals & Bedtime     If needed for Cramping, Bloating, Nausea or Diarrhea. .  gabapentin (NEURONTIN) 600 MG tablet, Take  1 tablet  3 x /day  for Facial Pain .  omeprazole (PRILOSEC) 40 MG capsule, Take  1 capsule   Daily  To Prevent Heartburn & Indigestion .  polyvinyl alcohol (LIQUIFILM TEARS) 1.4 % ophthalmic solution, Place 1 drop into both eyes 2 (two) times daily as needed for dry eyes.   Allergies  Allergen Reactions  . Latex Hives    TONGUE HAD BLISTERS WHEN HAD DENTAL SURGERY  . Amoxicillin Nausea And Vomiting  . Aspirin Other (See  Comments)    Gi upset  . Barbiturates Other (See Comments)    Unknown reaction  . Celebrex [Celecoxib] Other (See Comments)    Unknown reaction  . Codeine Itching  . Evista [Raloxifene] Other (See Comments)    Unknown reaction  . Lipitor [Atorvastatin] Other (See Comments)    Unknown reaction  . Morphine And Related Other (See Comments)    Unknown reaction  . Oruvail [Ketoprofen] Other (See Comments)    Unknown reaction  . Pantoprazole Diarrhea  . Paraffin Other (See Comments)    Unknown reaction  . Prochlorperazine Other (See Comments)    Uncontrolled shaking  . Proloprim [Trimethoprim] Other (See Comments)    Unknown reaction  . Venlafaxine Nausea Only  . Vibra-Tab [Doxycycline] Other (See Comments)    Unknown  reaction  . Vioxx [Rofecoxib] Other (See Comments)    edema  . Betadine [Povidone Iodine] Rash  . Caffeine Palpitations    Current Problems (verified) Patient Active Problem List   Diagnosis Date Noted  . Anemia 05/31/2020  . Rectal bleeding 05/31/2020  . Left lower quadrant abdominal pain 05/31/2020  . Epigastric discomfort 11/19/2019  . Anxiety 11/19/2019  . Syncope 10/10/2019  . Diastolic CHF ([place]) XX123456  . History of embolic stroke XX123456  . Atrial fibrillation with controlled ventricular response ([place]) 03/12/2019  . Coronary atherosclerosis due to lipid rich plaque 03/26/2018  . Renal infarct ([place]) 03/22/2018  . Hyponatremia 03/22/2018  . Chronic hyperkalemia 03/22/2018  . Hepatic steatosis 11/17/2017  . Aortic atherosclerosis ([place]) by Abd CT scan (05/2020)  08/08/2017  . COPD (chronic obstructive pulmonary disease) with emphysema ([place]) 12/21/2015  . 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 Osteoporosis 12/21/2015  . Mild malnutrition ([place]) 05/11/2015  . Encounter for Medicare annual wellness exam 04/08/2015  . Medication management 01/27/2014  . 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 Vitamin D deficiency 07/08/2013  . Hypertension   . Hyperlipidemia   . GERD   . Headache, post-traumatic, chronic 09/20/2011    Screening Tests Immunization History  Administered Date(s) Administered  . Influenza, High Dose Seasonal PF 03/25/2014, 04/05/2016, 06/02/2018, 03/12/2019, 03/23/2020  . Influenza-Unspecified 03/09/2013, 03/11/2015  . PFIZER(Purple Top)SARS-COV-2 Vaccination 06/01/2020, 06/22/2020  . Pneumococcal Conjugate-13 12/21/2015  . Pneumococcal Polysaccharide-23 06/25/2002  . Td 06/26/2003   Preventative care: Last colonoscopy: 2013 EGD 2013  Last mammogram: 09/2020 DEXA: 2017  Prior vaccinations: TD or Tdap: 2005, declines, will get PRN Influenza: 2021  Pneumococcal: 2004 Prevnar13: 2017 Shingles/Zostavax: N/A Covid 19: 2/2, 2021  Names of Other  Physician/Practitioners you currently use: 1. [facility] here for primary care 2.  Eye Exam, Dr. Sabra Heck 2022 3. Dentist 2022  Patient Care Team: Unk Pinto, MD as PCP - General (Internal Medicine) Skeet Latch, MD as PCP - Cardiology (Cardiology) Darleen Crocker, MD as Consulting Physician (Ophthalmology) Ladene Artist, MD as Consulting Physician (Gastroenterology) Cameron Sprang, MD as Consulting Physician (Neurology) Jodi Marble, MD as Consulting Physician (Otolaryngology)  SURGICAL HISTORY She  has a past surgical history that includes Abdominal hysterectomy; Hand surgery; Shoulder arthroscopy w/ rotator cuff repair; Cervical disc surgery; Back surgery; ORIF tripod fracture (07/13/2011); Colonoscopy; Facial fracture surgery; Back surgery; Ptosis repair (Bilateral, 11/22/2016); and Breast excisional biopsy. FAMILY HISTORY Her family history includes Breast cancer (age of onset: 65) in her sister; Cancer in her sister, sister, and sister; Colon cancer in her sister; Heart attack in her father; Heart disease in her father, mother, and sister; Hyperlipidemia in her daughter; Hypertension in  her daughter. SOCIAL HISTORY She  reports that she has never smoked. She has never used smokeless tobacco. She reports that she does not drink alcohol and does not use drugs.   MEDICARE WELLNESS OBJECTIVES: Physical activity: Current Exercise Habits: The patient does not participate in regular exercise at present, Exercise limited by: None identified Cardiac risk factors: Cardiac Risk Factors include: advanced age (>43men, >56 women);dyslipidemia;sedentary lifestyle;hypertension Depression/mood screen:   Depression screen [facility] 2/9 10/20/2020  Decreased Interest 0  Down, Depressed, Hopeless 1  PHQ - 2 Score 1  Some recent data might be hidden    ADLs:  In your present state of health, do you have any difficulty performing the following activities:  10/20/2020 08/06/2020  Hearing? N N  Comment hearing aids work well -  Vision? N N  Difficulty concentrating or making decisions? N N  Comment - -  Walking or climbing stairs? N N  Dressing or bathing? N N  Doing errands, shopping? N N  Some recent data might be hidden     Cognitive Testing  Alert? Yes  Normal Appearance?Yes  Oriented to person? Yes  Place? Yes   Time? Yes  Recall of three objects?  Yes  Can perform simple calculations? Yes  Displays appropriate judgment?Yes  Can read the correct time from a watch face?Yes  EOL planning: Does Patient Have a Medical Advance Directive?: Yes Type of Advance Directive: Living will Does patient want to make changes to medical advance directive?: No - Patient declined    Review of Systems  Constitutional: Negative for malaise/fatigue and weight loss.  HENT: Negative for hearing loss and tinnitus.   Eyes: Negative for blurred vision and double vision.  Respiratory: Negative for cough, sputum production, shortness of breath and wheezing.        Chronic facial pain, well controlled  Cardiovascular: Negative for chest pain, palpitations, orthopnea, claudication, leg swelling and PND.  Gastrointestinal: Negative for abdominal pain, blood in stool, constipation, diarrhea (intermittent), heartburn, melena, nausea and vomiting.  Genitourinary: Negative.   Musculoskeletal: Negative for falls, joint pain and myalgias.  Skin: Negative for rash.  Neurological: Negative for dizziness, tingling, sensory change, weakness and headaches.  Endo/Heme/Allergies: Negative for polydipsia.  Psychiatric/Behavioral: Negative.  Negative for depression, memory loss, substance abuse and suicidal ideas. The patient is not nervous/anxious and does not have insomnia.   All other systems reviewed and are negative.    Objective:     Today's Vitals   10/20/20 1401 10/20/20 1435  BP: (!) 150/70 140/64  Pulse: 64   Temp: (!) 97.3 F (36.3 C)   SpO2: 98%    Weight: 117 lb (53.1 kg)    Body mass index is 20.4 kg/m.  General appearance: alert, no distress, WD/WN, thin elderly female HEENT: normocephalic, sclerae anicteric, TMs pearly right, left cerumen impaction, nares patent, no discharge or erythema, pharynx normal. Mildly HOH, hearing aids.  Oral cavity: MMM, no lesions Neck: supple, no lymphadenopathy, no thyromegaly, no masses Heart: RRR, normal S1, S2, no murmurs Lungs: CTA bilaterally, no wheezes, rhonchi, or rales Abdomen: +bs, soft, diffuse tenderness, non distended, no masses, no hepatomegaly, no splenomegaly Musculoskeletal: nontender, no swelling, no obvious deformity Extremities: no edema, no cyanosis, no clubbing Pulses: 2+ symmetric, upper and lower extremities, normal cap refill Neurological: alert, oriented x 3, CN2-12 intact, strength normal upper extremities and lower extremities, sensation normal throughout, DTRs 2+ throughout, no cerebellar signs, gait normal  Psychiatric: normal affect, behavior normal, pleasant   Medicare Attestation I have  personally reviewed: The patient's medical and social history Their use of alcohol, tobacco or illicit drugs Their current medications and supplements The patient's functional ability including ADLs,fall risks, home safety risks, cognitive, and hearing and visual impairment Diet and physical activities Evidence for depression or mood disorders  The patient's weight, height, BMI, and visual acuity have been recorded in the chart.  I have made referrals, counseling, and provided education to the patient based on review of the above and I have provided the patient with a written personalized care plan for preventive services.     Izora Ribas, NP   06/16/2019

## 2020-10-21 ENCOUNTER — Other Ambulatory Visit: Payer: Self-pay | Admitting: Adult Health

## 2020-10-21 NOTE — Addendum Note (Signed)
Addended by: Izora Ribas on: 10/21/2020 07:31 AM   Modules accepted: Orders

## 2020-10-22 ENCOUNTER — Encounter: Payer: Self-pay | Admitting: Adult Health

## 2020-10-22 LAB — CBC WITH DIFFERENTIAL/PLATELET
Absolute Monocytes: 768 cells/uL (ref 200–950)
Basophils Absolute: 41 cells/uL (ref 0–200)
Basophils Relative: 0.6 %
Eosinophils Absolute: 129 cells/uL (ref 15–500)
Eosinophils Relative: 1.9 %
HCT: 32.8 % — ABNORMAL LOW (ref 35.0–45.0)
Hemoglobin: 10.9 g/dL — ABNORMAL LOW (ref 11.7–15.5)
Lymphs Abs: 1714 cells/uL (ref 850–3900)
MCH: 30.6 pg (ref 27.0–33.0)
MCHC: 33.2 g/dL (ref 32.0–36.0)
MCV: 92.1 fL (ref 80.0–100.0)
MPV: 10.9 fL (ref 7.5–12.5)
Monocytes Relative: 11.3 %
Neutro Abs: 4148 cells/uL (ref 1500–7800)
Neutrophils Relative %: 61 %
Platelets: 208 10*3/uL (ref 140–400)
RBC: 3.56 10*6/uL — ABNORMAL LOW (ref 3.80–5.10)
RDW: 12.2 % (ref 11.0–15.0)
Total Lymphocyte: 25.2 %
WBC: 6.8 10*3/uL (ref 3.8–10.8)

## 2020-10-22 LAB — COMPLETE METABOLIC PANEL WITH GFR
AG Ratio: 1.9 (calc) (ref 1.0–2.5)
ALT: 9 U/L (ref 6–29)
AST: 15 U/L (ref 10–35)
Albumin: 4.5 g/dL (ref 3.6–5.1)
Alkaline phosphatase (APISO): 77 U/L (ref 37–153)
BUN: 20 mg/dL (ref 7–25)
CO2: 31 mmol/L (ref 20–32)
Calcium: 9.4 mg/dL (ref 8.6–10.4)
Chloride: 98 mmol/L (ref 98–110)
Creat: 0.83 mg/dL (ref 0.60–0.88)
GFR, Est African American: 75 mL/min/{1.73_m2} (ref >=60)
GFR, Est Non African American: 65 mL/min/{1.73_m2} (ref >=60)
Globulin: 2.4 g/dL (calc) (ref 1.9–3.7)
Glucose, Bld: 95 mg/dL (ref 65–99)
Potassium: 4.1 mmol/L (ref 3.5–5.3)
Sodium: 136 mmol/L (ref 135–146)
Total Bilirubin: 0.3 mg/dL (ref 0.2–1.2)
Total Protein: 6.9 g/dL (ref 6.1–8.1)

## 2020-10-22 LAB — IRON,TIBC AND FERRITIN PANEL
%SAT: 11 % (calc) — ABNORMAL LOW (ref 16–45)
Ferritin: 9 ng/mL — ABNORMAL LOW (ref 16–288)
Iron: 47 ug/dL (ref 45–160)
TIBC: 414 mcg/dL (calc) (ref 250–450)

## 2020-10-22 LAB — LIPID PANEL
Cholesterol: 165 mg/dL (ref <200)
HDL: 78 mg/dL (ref >=50)
LDL Cholesterol (Calc): 63 mg/dL (calc)
Non-HDL Cholesterol (Calc): 87 mg/dL (calc) (ref <130)
Total CHOL/HDL Ratio: 2.1 (calc) (ref <5.0)
Triglycerides: 154 mg/dL — ABNORMAL HIGH (ref <150)

## 2020-10-22 LAB — HEMOGLOBIN A1C
Hgb A1c MFr Bld: 5.7 % of total Hgb — ABNORMAL HIGH (ref <5.7)
Mean Plasma Glucose: 117 mg/dL
eAG (mmol/L): 6.5 mmol/L

## 2020-10-22 LAB — TEST AUTHORIZATION

## 2020-10-22 LAB — MAGNESIUM: Magnesium: 2 mg/dL (ref 1.5–2.5)

## 2020-10-22 LAB — TSH: TSH: 0.71 mIU/L (ref 0.40–4.50)

## 2020-10-24 ENCOUNTER — Encounter: Payer: Self-pay | Admitting: *Deleted

## 2020-11-01 ENCOUNTER — Other Ambulatory Visit (INDEPENDENT_AMBULATORY_CARE_PROVIDER_SITE_OTHER): Payer: PPO

## 2020-11-01 ENCOUNTER — Other Ambulatory Visit: Payer: Self-pay | Admitting: Adult Health

## 2020-11-01 DIAGNOSIS — D649 Anemia, unspecified: Secondary | ICD-10-CM | POA: Diagnosis not present

## 2020-11-01 DIAGNOSIS — D509 Iron deficiency anemia, unspecified: Secondary | ICD-10-CM

## 2020-11-01 LAB — POC HEMOCCULT BLD/STL (HOME/3-CARD/SCREEN)
Card #2 Fecal Occult Blod, POC: NEGATIVE
Card #3 Fecal Occult Blood, POC: NEGATIVE
Fecal Occult Blood, POC: NEGATIVE

## 2020-11-02 ENCOUNTER — Other Ambulatory Visit: Payer: Self-pay

## 2020-11-02 ENCOUNTER — Ambulatory Visit (INDEPENDENT_AMBULATORY_CARE_PROVIDER_SITE_OTHER): Payer: PPO | Admitting: *Deleted

## 2020-11-02 DIAGNOSIS — D509 Iron deficiency anemia, unspecified: Secondary | ICD-10-CM | POA: Diagnosis not present

## 2020-11-02 LAB — CBC WITH DIFFERENTIAL/PLATELET
Absolute Monocytes: 781 cells/uL (ref 200–950)
Basophils Absolute: 31 cells/uL (ref 0–200)
Basophils Relative: 0.5 %
Eosinophils Absolute: 167 cells/uL (ref 15–500)
Eosinophils Relative: 2.7 %
HCT: 34.9 % — ABNORMAL LOW (ref 35.0–45.0)
Hemoglobin: 11.3 g/dL — ABNORMAL LOW (ref 11.7–15.5)
Lymphs Abs: 1600 cells/uL (ref 850–3900)
MCH: 29.9 pg (ref 27.0–33.0)
MCHC: 32.4 g/dL (ref 32.0–36.0)
MCV: 92.3 fL (ref 80.0–100.0)
MPV: 11.1 fL (ref 7.5–12.5)
Monocytes Relative: 12.6 %
Neutro Abs: 3621 cells/uL (ref 1500–7800)
Neutrophils Relative %: 58.4 %
Platelets: 208 10*3/uL (ref 140–400)
RBC: 3.78 10*6/uL — ABNORMAL LOW (ref 3.80–5.10)
RDW: 12.1 % (ref 11.0–15.0)
Total Lymphocyte: 25.8 %
WBC: 6.2 10*3/uL (ref 3.8–10.8)

## 2020-11-02 NOTE — Progress Notes (Signed)
Patient is here to recheck a CBC, due to anemia. Patient sent in Hemoccult card, which was negative for blood in her stool. Patient states she did not start the iron tablet or Vitamin C because she thought she was to wait until after her lab check today. Patient was advised to start the Iron in the morning, since she wants to take it with her breakfast.

## 2020-11-16 ENCOUNTER — Telehealth: Payer: Self-pay | Admitting: *Deleted

## 2020-11-16 NOTE — Telephone Encounter (Signed)
Patient called and states iron pills are causing stomach pain and constipation. Patient advised to stop the iron for a few days and restart 1 tablet every other day, with food.

## 2020-11-23 ENCOUNTER — Ambulatory Visit: Payer: PPO

## 2020-12-15 ENCOUNTER — Encounter: Payer: PPO | Admitting: Internal Medicine

## 2020-12-19 ENCOUNTER — Other Ambulatory Visit: Payer: Self-pay | Admitting: Internal Medicine

## 2020-12-19 MED ORDER — DEXAMETHASONE 4 MG PO TABS: ORAL_TABLET | ORAL | 0 refills | Status: DC

## 2020-12-19 MED ORDER — PROMETHAZINE-DM 6.25-15 MG/5ML PO SYRP: ORAL_SOLUTION | ORAL | 0 refills | Status: DC

## 2020-12-19 MED ORDER — AZITHROMYCIN 250 MG PO TABS: ORAL_TABLET | ORAL | 0 refills | Status: DC

## 2020-12-23 ENCOUNTER — Other Ambulatory Visit: Payer: Self-pay | Admitting: Internal Medicine

## 2020-12-23 DIAGNOSIS — K589 Irritable bowel syndrome without diarrhea: Secondary | ICD-10-CM

## 2020-12-27 ENCOUNTER — Emergency Department (HOSPITAL_COMMUNITY): Payer: PPO

## 2020-12-27 ENCOUNTER — Emergency Department (HOSPITAL_COMMUNITY)
Admission: EM | Admit: 2020-12-27 | Discharge: 2020-12-27 | Disposition: A | Discharge status: Home / Self Care | Payer: PPO | Attending: Emergency Medicine | Admitting: Emergency Medicine

## 2020-12-27 ENCOUNTER — Other Ambulatory Visit: Payer: Self-pay | Admitting: Internal Medicine

## 2020-12-27 DIAGNOSIS — U071 COVID-19: Secondary | ICD-10-CM | POA: Diagnosis not present

## 2020-12-27 DIAGNOSIS — R0602 Shortness of breath: Secondary | ICD-10-CM | POA: Diagnosis not present

## 2020-12-27 DIAGNOSIS — R457 State of emotional shock and stress, unspecified: Secondary | ICD-10-CM | POA: Diagnosis not present

## 2020-12-27 DIAGNOSIS — Z91040 Latex allergy status: Secondary | ICD-10-CM | POA: Diagnosis not present

## 2020-12-27 DIAGNOSIS — I10 Essential (primary) hypertension: Secondary | ICD-10-CM | POA: Diagnosis not present

## 2020-12-27 DIAGNOSIS — J449 Chronic obstructive pulmonary disease, unspecified: Secondary | ICD-10-CM | POA: Insufficient documentation

## 2020-12-27 DIAGNOSIS — Z7901 Long term (current) use of anticoagulants: Secondary | ICD-10-CM | POA: Insufficient documentation

## 2020-12-27 DIAGNOSIS — I130 Hypertensive heart and chronic kidney disease with heart failure and stage 1 through stage 4 chronic kidney disease, or unspecified chronic kidney disease: Secondary | ICD-10-CM | POA: Diagnosis not present

## 2020-12-27 DIAGNOSIS — I5043 Acute on chronic combined systolic (congestive) and diastolic (congestive) heart failure: Secondary | ICD-10-CM | POA: Insufficient documentation

## 2020-12-27 DIAGNOSIS — N189 Chronic kidney disease, unspecified: Secondary | ICD-10-CM | POA: Diagnosis not present

## 2020-12-27 MED ORDER — APIXABAN 2.5 MG PO TABS
2.5000 mg | ORAL_TABLET | Freq: Once | ORAL | Status: AC
  Administered 2020-12-27: 2.5 mg via ORAL
  Filled 2020-12-27: qty 1

## 2020-12-27 MED ORDER — ACETAMINOPHEN 325 MG PO TABS
650.0000 mg | ORAL_TABLET | Freq: Once | ORAL | Status: AC
  Administered 2020-12-27: 650 mg via ORAL
  Filled 2020-12-27: qty 2

## 2020-12-27 MED ORDER — APIXABAN 2.5 MG PO TABS: ORAL_TABLET | ORAL | 1 refills | Status: DC

## 2020-12-27 NOTE — Discharge Instructions (Addendum)
Chest x-ray today negative for any pneumonia.  Oxygen levels here were very good in the upper 90s.  Return for any new or worse symptoms.

## 2020-12-27 NOTE — ED Notes (Signed)
Daughter Arbie Cookey updated on patient's status

## 2020-12-27 NOTE — ED Notes (Signed)
Patient was placed on a [employer]
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
## 2020-12-27 NOTE — ED Provider Notes (Addendum)
[place] DEPT Provider Note   CSN: [id number] Arrival date & time: 12/27/20  1530     History Chief Complaint  Patient presents with   Shortness of Breath   Generalized Body Aches    Debra White is a 87 y.o. female.  Patient with a history of positive COVID test 4 days ago.  Patient's husband is at [facility] with COVID infection.  Not clear how long she has had symptoms.  But she had symptoms before the positive test.  Past medical history is significant for acute on chronic combined systolic diastolic congestive heart failure.  Chronic kidney disease.  Fibromyalgia.  And emphysema of the lungs.      Past Medical History:  Diagnosis Date   Acute on chronic combined systolic and diastolic CHF (congestive heart failure) ([place]) 06/29/2019   Anxiety    Arthritis    Blood transfusion    C. difficile colitis    Candida esophagitis ([place]) 2013   EGD   Cataract    Chronic kidney disease    STONES   Diverticulitis 04/05/2017   Diverticulosis of colon (without mention of hemorrhage) 2007   Colonoscopy   Dysrhythmia    RX   Emphysema of lung ([place])    Family history of colon cancer    sister   Fibromyalgia    Fracture, zygoma closed ([place]) 07/13/2011   GERD (gastroesophageal reflux disease)    Headache(784.0)    Hyperlipemia    Hypertension    Impingement syndrome of left shoulder 09/25/2016   Impingement syndrome of right shoulder 09/25/2016   Kidney stones    Ptosis, bilateral    Renal infarct [facility])    Splenic infarct     Patient Active Problem List   Diagnosis Date Noted   Thrombophilia ([place]) 10/20/2020   Iron deficiency anemia 05/31/2020   Epigastric discomfort 11/19/2019   Anxiety 17/49/4496   Diastolic CHF ([place]) 75/91/6384   History of embolic stroke 66/59/9357   Atrial fibrillation with controlled ventricular response ([place]) 03/12/2019   Coronary atherosclerosis due to lipid rich plaque 03/26/2018   Renal infarct ([place]) 03/22/2018    Hyponatremia 03/22/2018   Chronic hyperkalemia 03/22/2018   Hepatic steatosis 11/17/2017   Aortic atherosclerosis ([place]) by Abd CT scan (05/2020)  08/08/2017   COPD (chronic obstructive pulmonary disease) with emphysema ([place]) 12/21/2015   Tortuous aorta ([place]) 12/21/2015   Osteoporosis 12/21/2015   Mild malnutrition ([place]) 05/11/2015   Encounter for Medicare annual wellness exam 04/08/2015   Medication management 01/27/2014   Atypical facial pain 10/08/2013   Vitamin D deficiency 07/08/2013   Hypertension    Hyperlipidemia    GERD    Headache, post-traumatic, chronic 09/20/2011    Past Surgical History:  Procedure Laterality Date   ABDOMINAL HYSTERECTOMY     BACK SURGERY     X2   BACK SURGERY     BREAST EXCISIONAL BIOPSY     CERVICAL DISC SURGERY     COLONOSCOPY     FACIAL FRACTURE SURGERY     HAND SURGERY     BIL    ORIF TRIPOD FRACTURE  07/13/2011   Procedure: OPEN REDUCTION INTERNAL FIXATION (ORIF) TRIPOD FRACTURE;  Surgeon: Tyson Alias, MD;  Location[facility];  Service: ENT;  Laterality: Right;  ORIF RIGHT ZYGOMA, ORBITAL FLOOR EXPLORATION WITH FROST STITCH (TEMPORARY TARSORRHAPHY)   PTOSIS REPAIR Bilateral 11/22/2016   Procedure: INTERNAL PTOSIS REPAIR;  Surgeon: Clista Bernhardt, MD;  Location: [facility];  Service: Ophthalmology;  Laterality: Bilateral;   SHOULDER ARTHROSCOPY W/ ROTATOR CUFF REPAIR     LFT     OB History   No obstetric history on file.     Family History  Problem Relation Age of Onset   Heart disease Mother    Heart disease Father    Heart attack Father    Breast cancer Sister 69   Colon cancer Sister    Cancer Sister        breast   Heart disease Sister    Cancer Sister        colon   Cancer Sister        colon   Hypertension Daughter    Hyperlipidemia Daughter     Social History   Tobacco Use   Smoking status: Never   Smokeless tobacco: Never  Vaping Use   Vaping Use: Never used  Substance Use Topics   Alcohol use: No   Drug  use: No    Home Medications Prior to Admission medications   Medication Sig Start Date End Date Taking? Authorizing Provider  acetaminophen (TYLENOL) 500 MG tablet Take 1,000 mg by mouth every 8 (eight) hours as needed for headache.     [provider]  apixaban (ELIQUIS) 2.5 MG TABS tablet Take  1 Tablet  2 x /day (every 12 hours) to Prevent Blood Clots 12/27/20   Unk Pinto, MD  atenolol (TENORMIN) 100 MG tablet Take 1 tablet daily for blood pressure 10/20/20   Liane Comber, NP  Cholecalciferol (VITAMIN D-3) 125 MCG (5000 UT) TABS Take 5,000 Units by mouth daily.    [provider]  clonazePAM (KLONOPIN) 1 MG tablet TAKE 1/2 TO 1 TABLET BY MOUTH 2-3 TIMES DAILY AS NEEDED FOR ANXIETY ATTACKS LIMIT TO 5 DAYS A WEEK TO AVOID ADDICTION AND DEMENTIA 10/05/20   Unk Pinto, MD  conjugated estrogens (PREMARIN) vaginal cream Place 1 Applicatorful vaginally See admin instructions. Apply one applicatorful vaginally once a week    [provider]  Cyanocobalamin (VITAMIN B 12 PO) Take 1 tablet by mouth daily.    [provider]  dexamethasone (DECADRON) 4 MG tablet Take 1 tab 3 x /day for 2 days,      then 2 x /day for 2  Days,     then 1 tab daily Patient not taking: Reported on 12/27/2020 12/19/20   Unk Pinto, MD  diclofenac Sodium (VOLTAREN) 1 % GEL APPLY 2 GRAMS TOPICALLY 3-4 TIMES DAILY AS NEEDED FOR PAIN 10/20/20   Unk Pinto, MD  dicyclomine (BENTYL) 20 MG tablet TAKE 1 TABLET BY MOUTH FOUR TIMES DAILY WITH MEALS AND BEDTIME FOR CRAMPING  NAUSEA  OR BLOATING 12/23/20   Liane Comber, NP  gabapentin (NEURONTIN) 600 MG tablet Take  1 tablet  3 x /day  for Facial Pain 10/17/20   Unk Pinto, MD  lovastatin (MEVACOR) 20 MG tablet Take 1 tablet at Bedtime for Cholesterol 06/29/20   Garnet Sierras, NP  olmesartan (BENICAR) 40 MG tablet Take  1 tablet  Daily for BP 09/27/20   Unk Pinto, MD  omeprazole (PRILOSEC) 40 MG capsule Take  1 capsule    Daily  To Prevent Heartburn & Indigestion 08/16/20   Unk Pinto, MD  polyvinyl alcohol (LIQUIFILM TEARS) 1.4 % ophthalmic solution Place 1 drop into both eyes 2 (two) times daily as needed for dry eyes.    [provider]  promethazine-dextromethorphan (PROMETHAZINE-DM) 6.25-15 MG/5ML syrup Take 1 tsp every 4 hours if needed for cough 12/19/20  Unk Pinto, MD  Vaginal Lubricant (REPLENS) GEL Place vaginally daily.    [provider]    Allergies    Latex, Amoxicillin, Aspirin, Barbiturates, Celebrex [celecoxib], Codeine, Evista [raloxifene], Lipitor [atorvastatin], Morphine and related, Oruvail [ketoprofen], Pantoprazole, Paraffin, Prochlorperazine, Proloprim [trimethoprim], Venlafaxine, Vibra-tab [doxycycline], Vioxx [rofecoxib], Betadine [povidone iodine], and Caffeine  Review of Systems   Review of Systems  Constitutional:  Negative for chills and fever.  HENT:  Negative for ear pain and sore throat.   Eyes:  Negative for pain and visual disturbance.  Respiratory:  Positive for shortness of breath. Negative for cough.   Cardiovascular:  Negative for chest pain and palpitations.  Gastrointestinal:  Negative for abdominal pain and vomiting.  Genitourinary:  Negative for dysuria and hematuria.  Musculoskeletal:  Negative for arthralgias and back pain.  Skin:  Negative for color change and rash.  Neurological:  Negative for seizures and syncope.  All other systems reviewed and are negative.  Physical Exam Updated Vital Signs BP (!) 169/92 (BP Location: Right Arm)   Pulse 60   Temp 98.1 F (36.7 C)   Resp 15   Ht 1.626 m (5\' 4" )   Wt 50.3 kg   SpO2 100%   BMI 19.05 kg/m   Physical Exam Vitals and nursing note reviewed.  Constitutional:      General: She is not in acute distress.    Appearance: Normal appearance. She is well-developed.  HENT:     Head: Normocephalic and atraumatic.     Mouth/Throat:     Mouth: Mucous membranes are moist.  Eyes:      Extraocular Movements: Extraocular movements intact.     Conjunctiva/sclera: Conjunctivae normal.     Pupils: Pupils are equal, round, and reactive to light.  Cardiovascular:     Rate and Rhythm: Normal rate and regular rhythm.     Heart sounds: No murmur heard. Pulmonary:     Effort: Pulmonary effort is normal. No respiratory distress.     Breath sounds: Normal breath sounds. No wheezing.  Abdominal:     Palpations: Abdomen is soft.     Tenderness: There is no abdominal tenderness.  Musculoskeletal:     Cervical back: Normal range of motion and neck supple. No rigidity.  Skin:    General: Skin is warm and dry.  Neurological:     General: No focal deficit present.     Mental Status: She is alert and oriented to person, place, and time.     Comments: Except for hard of hearing.    ED Results / Procedures / Treatments   Labs (all labs ordered are listed, but only abnormal results are displayed) Labs Reviewed - No data to display  EKG None  Radiology DG Chest [facility] 1 View  Result Date: 12/27/2020 CLINICAL DATA:  Shortness of breath EXAM: PORTABLE CHEST 1 VIEW COMPARISON:  10/10/2019 FINDINGS: The heart size and mediastinal contours are within normal limits. Both lungs are clear. The visualized skeletal structures are unremarkable. IMPRESSION: No active disease. Electronically Signed   By: Donavan Foil M.D.   On: 12/27/2020 19:02    Procedures Procedures   Medications Ordered in ED Medications  apixaban (ELIQUIS) tablet 2.5 mg (2.5 mg Oral Given 12/27/20 1832)  acetaminophen (TYLENOL) tablet 650 mg (650 mg Oral Given 12/27/20 1933)    ED Course  I have reviewed the triage vital signs and the nursing notes.  Pertinent labs & imaging results that were available during my care of the patient were reviewed  by me and considered in my medical decision making (see chart for details).    MDM Rules/Calculators/A&P                          Patient tested positive for COVID 4  days ago.  Attempted to call her sister who is she is currently living with and helping to take care of her since her husband is hospitalized at [facility] with COVID.  Patient unable to state how long she has had symptoms.  We may have the wrong number.  Because the patient's been able to call her sister.  I asked the nurse to try to get in contact with the sister to see if we can determine how long she has had symptoms.  Because she may be a candidate for Paxil bid if things are within 5 days.  Chest x-ray here without evidence of any pneumonia oxygen saturations are excellent in the upper 90s on room air.  Patient in no acute distress.  No fever.  Blood pressure is 167/77.  Patient was given her Eliquis here which would be her evening dose because she was worried about missing that.  And patient given some Tylenol that she wanted.  If she is in the window for Paxil bid.  Will check renal function.  Patient's sister is able to come get her as she just needs to wait for the stormed the past.  But we will check back with the sister to see whether she knows how long Ms. Smylie is had symptoms.   Final Clinical Impression(s) / ED Diagnoses Final diagnoses:  COVID    Rx / DC Orders ED Discharge Orders     None        Fredia Sorrow, MD 12/27/20 1944  Patient's sister states that her primary care doctor started her on Paxil bid.  So no reason to prescribe that.    Fredia Sorrow, MD 12/27/20 2001

## 2020-12-27 NOTE — ED Triage Notes (Signed)
Ems brings pt in from home for shortness of breath. Pt reports having a covid home trest 4 days ago.

## 2020-12-27 NOTE — ED Notes (Signed)
Daughter,Carol, would like an update when you know what is wrong with her mom.

## 2020-12-27 NOTE — ED Notes (Signed)
Pt given water 

## 2020-12-28 ENCOUNTER — Other Ambulatory Visit (HOSPITAL_BASED_OUTPATIENT_CLINIC_OR_DEPARTMENT_OTHER): Payer: Self-pay

## 2020-12-28 MED ORDER — APIXABAN 2.5 MG PO TABS: ORAL_TABLET | ORAL | 1 refills | Status: DC

## 2021-01-02 DIAGNOSIS — Z20828 Contact with and (suspected) exposure to other viral communicable diseases: Secondary | ICD-10-CM | POA: Diagnosis not present

## 2021-01-03 ENCOUNTER — Ambulatory Visit: Payer: PPO

## 2021-01-03 ENCOUNTER — Other Ambulatory Visit: Payer: Self-pay

## 2021-01-03 ENCOUNTER — Ambulatory Visit (INDEPENDENT_AMBULATORY_CARE_PROVIDER_SITE_OTHER): Payer: PPO | Admitting: Internal Medicine

## 2021-01-03 ENCOUNTER — Encounter: Payer: Self-pay | Admitting: Internal Medicine

## 2021-01-03 VITALS — BP 114/72 | HR 63 | Temp 97.7°F | Resp 16 | Ht 64.0 in | Wt 113.6 lb

## 2021-01-03 VITALS — BP 113/72 | HR 74 | Temp 97.6°F | Resp 16 | Ht 64.0 in | Wt 113.6 lb

## 2021-01-03 DIAGNOSIS — E611 Iron deficiency: Secondary | ICD-10-CM

## 2021-01-03 DIAGNOSIS — B370 Candidal stomatitis: Secondary | ICD-10-CM | POA: Diagnosis not present

## 2021-01-03 DIAGNOSIS — K121 Other forms of stomatitis: Secondary | ICD-10-CM | POA: Diagnosis not present

## 2021-01-03 DIAGNOSIS — Z79899 Other long term (current) drug therapy: Secondary | ICD-10-CM

## 2021-01-03 NOTE — Progress Notes (Signed)
Pt states she here to get her Anemia, CBC checked. Pt states she is taken her iron pills.

## 2021-01-03 NOTE — Progress Notes (Signed)
Future Appointments  Date Time Provider [place]  01/26/2021  9:40 AM Croitoru, Dani Gobble, MD [facility]  04/11/2021 10:00 AM GI-BCG DX DEXA 1 GI-BCGDG GI-BREAST CE  04/28/2021 10:00 AM Liane Comber, NP GAAM-GAAIM None  10/20/2021 11:30 AM Liane Comber, NP GAAM-[facility] None    History of Present Illness:    Patient is a very nice 87 yo MWF whose husband of 56 years recently admitted to hospital with Covid lung infection and moderate cognitive decline was just transferred to NH for chronic care     Medications  Current Outpatient Medications (Cardiovascular):    atenolol (TENORMIN) 100 MG tablet, Take 1 tablet daily for blood pressure   lovastatin (MEVACOR) 20 MG tablet, Take 1 tablet at Bedtime for Cholesterol   olmesartan (BENICAR) 40 MG tablet, Take  1 tablet  Daily for BP   promethazine-dextromethorphan (PROMETHAZINE-DM) 6.25-15 MG/5ML syrup, Take 1 tsp    acetaminophen (TYLENOL) 500 MG tablet, Take 1,000 mg by mouth every 8 (eight) hours as needed for headache.    apixaban (ELIQUIS) 2.5 MG TABS tablet, Take  1 Tablet  2 x /day (every 12 hours) to Prevent Blood Clots   Cyanocobalamin (VITAMIN B 12 PO), Take 1 tablet by mouth daily.   Cholecalciferol (VITAMIN D-3) 125 MCG (5000 UT) TABS, Take 5,000 Units by mouth daily.   clonazePAM (KLONOPIN) 1 MG tablet, TAKE 1/2 TO 1 TABLET BY MOUTH 2-3 TIMES DAILY AS NEEDED FOR ANXIETY ATTACKS LIMIT TO 5 DAYS A WEEK TO AVOID ADDICTION AND DEMENTIA   conjugated estrogens (PREMARIN) vaginal cream, Place 1 Applicatorful vaginally See admin instructions. Apply one applicatorful vaginally once a week   diclofenac Sodium (VOLTAREN) 1 % GEL, APPLY 2 GRAMS TOPICALLY 3-4 TIMES DAILY AS NEEDED FOR PAIN   dicyclomine (BENTYL) 20 MG tablet, TAKE 1 TABLET BY MOUTH FOUR TIMES DAILY WITH MEALS AND BEDTIME FOR CRAMPING  NAUSEA  OR BLOATING   gabapentin (NEURONTIN) 600 MG tablet, Take  1 tablet  3 x /day  for Facial Pain   omeprazole (PRILOSEC)  40 MG capsule, Take  1 capsule   Daily  To Prevent Heartburn & Indigestion   polyvinyl alcohol (LIQUIFILM TEARS) 1.4 % ophthalmic solution, Place 1 drop into both eyes 2 (two) times daily as needed for dry eyes.   Vaginal Lubricant (REPLENS) GEL, Place vaginally daily.  Problem list She has Headache, post-traumatic, chronic; Hypertension; Hyperlipidemia; GERD; Vitamin D deficiency; Atypical facial pain; Medication management; Encounter for Medicare annual wellness exam; Mild malnutrition ([place]); COPD (chronic obstructive pulmonary disease) with emphysema ([place]); Tortuous aorta ([place]); Osteoporosis; Aortic atherosclerosis ([place]) by Abd CT scan (05/2020) ; Hepatic steatosis; Renal infarct ([place]); Hyponatremia; Chronic hyperkalemia; Coronary atherosclerosis due to lipid rich plaque; Atrial fibrillation with controlled ventricular response ([place]); Diastolic CHF ([place]); History of embolic stroke; Epigastric discomfort; Anxiety; Iron deficiency anemia; and Thrombophilia ([facility]) on their problem list.   Observations/Objective:  BP 113/72   Pulse 74   Temp 97.6 F (36.4 C)   Resp 16   Ht 5\' 4"  (1.626 m)   Wt 113 lb 9.6 oz (51.5 kg)   SpO2 96%   BMI 19.50 kg/m   HEENT - WNL except evidence of thrush & sytomatitis.  Neck - supple. Car 2+/2+ , no Bruits, nofes or  Chest - Clear equal BS. Cor - Nl HS. RRR w/o sig MGR. PP 1(+). No edema. MS- FROM w/o deformities.  Gait Nl. Neuro -  Nl w/o focal abnormalities. Tearful with husband 's decline.  Assessment and Plan:   1. Oral thrush   2. Stomatitis  - Rx Magic Mouth Wash   Follow Up Instructions:        I discussed the assessment and treatment plan with the patient. The patient was provided an opportunity to ask questions and all were answered. The patient agreed with the plan and demonstrated an understanding of the instructions.       The patient was advised to call back or seek an in-person evaluation if the symptoms worsen or if the condition  fails to improve as anticipated.    Kirtland Bouchard, MD

## 2021-01-04 LAB — CBC WITH DIFFERENTIAL/PLATELET
Absolute Monocytes: 760 cells/uL (ref 200–950)
Basophils Absolute: 16 cells/uL (ref 0–200)
Basophils Relative: 0.2 %
Eosinophils Absolute: 16 cells/uL (ref 15–500)
Eosinophils Relative: 0.2 %
HCT: 35.3 % (ref 35.0–45.0)
Hemoglobin: 11.7 g/dL (ref 11.7–15.5)
Lymphs Abs: 1216 cells/uL (ref 850–3900)
MCH: 30.3 pg (ref 27.0–33.0)
MCHC: 33.1 g/dL (ref 32.0–36.0)
MCV: 91.5 fL (ref 80.0–100.0)
MPV: 9.3 fL (ref 7.5–12.5)
Monocytes Relative: 9.5 %
Neutro Abs: 5992 cells/uL (ref 1500–7800)
Neutrophils Relative %: 74.9 %
Platelets: 325 10*3/uL (ref 140–400)
RBC: 3.86 10*6/uL (ref 3.80–5.10)
RDW: 13.3 % (ref 11.0–15.0)
Total Lymphocyte: 15.2 %
WBC: 8 10*3/uL (ref 3.8–10.8)

## 2021-01-04 LAB — FERRITIN: Ferritin: 95 ng/mL (ref 16–288)

## 2021-01-04 LAB — RETICULOCYTES
ABS Retic: 30880 cells/uL (ref 20000–80000)
Retic Ct Pct: 0.8 %

## 2021-01-05 ENCOUNTER — Ambulatory Visit: Payer: PPO | Admitting: Internal Medicine

## 2021-01-08 NOTE — Progress Notes (Signed)
============================================================ ============================================================  -    CBC shows Hgb /  Red cell count is Stable & OK ============================================================ ============================================================  -  Ferritin / Iron level is back in Normal - Great  ============================================================ ============================================================

## 2021-01-11 ENCOUNTER — Encounter: Payer: PPO | Admitting: Adult Health

## 2021-01-24 DIAGNOSIS — M542 Cervicalgia: Secondary | ICD-10-CM | POA: Diagnosis not present

## 2021-01-25 DIAGNOSIS — Z87440 Personal history of urinary (tract) infections: Secondary | ICD-10-CM | POA: Diagnosis not present

## 2021-01-25 DIAGNOSIS — N952 Postmenopausal atrophic vaginitis: Secondary | ICD-10-CM | POA: Diagnosis not present

## 2021-01-26 ENCOUNTER — Ambulatory Visit: Payer: PPO | Admitting: Cardiovascular Disease

## 2021-02-15 ENCOUNTER — Other Ambulatory Visit: Payer: Self-pay | Admitting: Internal Medicine

## 2021-02-15 DIAGNOSIS — K2100 Gastro-esophageal reflux disease with esophagitis, without bleeding: Secondary | ICD-10-CM

## 2021-03-14 ENCOUNTER — Ambulatory Visit: Payer: PPO | Admitting: Cardiovascular Disease

## 2021-03-14 ENCOUNTER — Encounter: Payer: Self-pay | Admitting: Cardiovascular Disease

## 2021-03-14 ENCOUNTER — Other Ambulatory Visit: Payer: Self-pay

## 2021-03-14 VITALS — BP 184/80 | HR 54 | Ht 64.0 in | Wt 109.6 lb

## 2021-03-14 DIAGNOSIS — E7800 Pure hypercholesterolemia, unspecified: Secondary | ICD-10-CM | POA: Diagnosis not present

## 2021-03-14 DIAGNOSIS — I10 Essential (primary) hypertension: Secondary | ICD-10-CM

## 2021-03-14 DIAGNOSIS — I5032 Chronic diastolic (congestive) heart failure: Secondary | ICD-10-CM

## 2021-03-14 DIAGNOSIS — I480 Paroxysmal atrial fibrillation: Secondary | ICD-10-CM | POA: Diagnosis not present

## 2021-03-14 DIAGNOSIS — I700 Atherosclerosis of aorta: Secondary | ICD-10-CM

## 2021-03-14 NOTE — Progress Notes (Signed)
Cardiology Office Note   Date:  03/18/2021   ID:  Debra White, Debra White Nov 24, 1933, MRN [id number]  PCP:  Unk Pinto, MD  Cardiologist:   Sanda Klein, MD   Chief Complaint  Patient presents with   Atrial Fibrillation      History of Present Illness: Debra White is a 87 y.o. female with hypertension, emphysema, chronic hyponatremia, atherosclerosis of the aorta and renal/splenic infarcts which were demonstrated to be likely secondary to paroxysmal atrial fibrillation.  Her husband Debra White is also my patient.  She has occasional palpitations since her last appointment, but these do not bother her too much.  She has not had any new episodes of abdominal pain, which alerted [place] to the presence of the atrial fibrillation due to her visceral infarcts.  She has not had any focal neurological complaints.  She is compliant with apixaban and has not had any falls, injuries or bleeding.  The dose of apixaban is adjusted for her age and very small body habitus.    She denies dyspnea at rest or with her daily activities, but is relatively sedentary.  She is under a lot of emotional stress due to her husband's worsening cognitive problems.  The children do help.  She does not have edema, orthopnea, PND, claudication, dizziness or syncope.  Her blood pressure is quite high today, despite the fact that she is taking enough atenolol to make her heart rate bradycardic and she is on the maximum dose of olmesartan.  Having said that, a couple of months ago her blood pressure was only 113/72.  She thinks her blood pressure is high because she is upset today.  Her most recent labs showed an excellent LDL cholesterol of 63, hemoglobin A1c of 5.7% and normal creatinine at 0.83 (labs on 10/20/2020).  Her hemoglobin was 11.7 on 01/03/2021.  She bears a diagnosis of iron deficiency anemia, but is currently not receiving iron supplements.  Previous CT of the abdomen shows extensive atherosclerosis of  the aorta as well diverticulosis.  She has not had recent melena or hematochezia.  She had an echo 09/2019 that revealed LVEF 55 to 60% with indeterminate diastolic function.  She has severe biatrial dilation, mild mitral insufficiency, moderate tricuspid insufficiency and aortic valve sclerosis without stenosis.   Past Medical History:  Diagnosis Date   Acute on chronic combined systolic and diastolic CHF (congestive heart failure) ([place]) 06/29/2019   Anxiety    Arthritis    Blood transfusion    C. difficile colitis    Candida esophagitis ([place]) 2013   EGD   Cataract    Chronic kidney disease    STONES   Diverticulitis 04/05/2017   Diverticulosis of colon (without mention of hemorrhage) 2007   Colonoscopy   Dysrhythmia    RX   Emphysema of lung ([place])    Family history of colon cancer    sister   Fibromyalgia    Fracture, zygoma closed ([place]) 07/13/2011   GERD (gastroesophageal reflux disease)    Headache(784.0)    Hyperlipemia    Hypertension    Impingement syndrome of left shoulder 09/25/2016   Impingement syndrome of right shoulder 09/25/2016   Kidney stones    Ptosis, bilateral    Renal infarct ([place])    Splenic infarct     Past Surgical History:  Procedure Laterality Date   ABDOMINAL HYSTERECTOMY     BACK SURGERY     X2   BACK SURGERY     BREAST EXCISIONAL  BIOPSY     CERVICAL DISC SURGERY     COLONOSCOPY     FACIAL FRACTURE SURGERY     HAND SURGERY     BIL    ORIF TRIPOD FRACTURE  07/13/2011   Procedure: OPEN REDUCTION INTERNAL FIXATION (ORIF) TRIPOD FRACTURE;  Surgeon: Tyson Alias, MD;  Location: [facility];  Service: ENT;  Laterality: Right;  ORIF RIGHT ZYGOMA, ORBITAL FLOOR EXPLORATION WITH FROST STITCH (TEMPORARY TARSORRHAPHY)   PTOSIS REPAIR Bilateral 11/22/2016   Procedure: INTERNAL PTOSIS REPAIR;  Surgeon: Clista Bernhardt, MD;  Location: [facility];  Service: Ophthalmology;  Laterality: Bilateral;   SHOULDER ARTHROSCOPY W/ ROTATOR CUFF REPAIR     LFT     Current  Outpatient Medications  Medication Sig Dispense Refill   acetaminophen (TYLENOL) 500 MG tablet Take 1,000 mg by mouth every 8 (eight) hours as needed for headache.      apixaban (ELIQUIS) 2.5 MG TABS tablet Take  1 Tablet  2 x /day (every 12 hours) to Prevent Blood Clots 180 tablet 1   atenolol (TENORMIN) 100 MG tablet Take 1 tablet daily for blood pressure 90 tablet 3   Cholecalciferol (VITAMIN D-3) 125 MCG (5000 UT) TABS Take 5,000 Units by mouth daily.     clonazePAM (KLONOPIN) 1 MG tablet TAKE 1/2 TO 1 TABLET BY MOUTH 2-3 TIMES DAILY AS NEEDED FOR ANXIETY ATTACKS LIMIT TO 5 DAYS A WEEK TO AVOID ADDICTION AND DEMENTIA 270 tablet 0   conjugated estrogens (PREMARIN) vaginal cream Place 1 Applicatorful vaginally See admin instructions. Apply one applicatorful vaginally once a week     Cyanocobalamin (VITAMIN B 12 PO) Take 1 tablet by mouth daily.     diclofenac Sodium (VOLTAREN) 1 % GEL APPLY 2 GRAMS TOPICALLY 3-4 TIMES DAILY AS NEEDED FOR PAIN 350 g 3   dicyclomine (BENTYL) 20 MG tablet TAKE 1 TABLET BY MOUTH FOUR TIMES DAILY WITH MEALS AND BEDTIME FOR CRAMPING  NAUSEA  OR BLOATING (Patient taking differently: TAKE 1 TABLET BY MOUTH FOUR TIMES DAILY WITH MEALS AND BEDTIME FOR CRAMPING  NAUSEA  OR BLOATING) 360 tablet 1   gabapentin (NEURONTIN) 600 MG tablet Take  1 tablet  3 x /day  for Facial Pain 270 tablet 1   lovastatin (MEVACOR) 20 MG tablet Take 1 tablet at Bedtime for Cholesterol 90 tablet 3   olmesartan (BENICAR) 40 MG tablet Take  1 tablet  Daily for BP 90 tablet 3   omeprazole (PRILOSEC) 40 MG capsule TAKE 1 CAPSULE BY MOUTH DAILY BEFORE BREAKFAST 90 capsule 1   polyvinyl alcohol (LIQUIFILM TEARS) 1.4 % ophthalmic solution Place 1 drop into both eyes 2 (two) times daily as needed for dry eyes.     promethazine-dextromethorphan (PROMETHAZINE-DM) 6.25-15 MG/5ML syrup Take 1 tsp every 4 hours if needed for cough 240 mL 0   Vaginal Lubricant (REPLENS) GEL Place vaginally daily.      dexamethasone (DECADRON) 4 MG tablet Take 1 tab 3 x /day for 2 days,      then 2 x /day for 2  Days,     then 1 tab daily (Patient not taking: Reported on 03/14/2021) 13 tablet 0   No current facility-administered medications for this visit.    Allergies:   Latex, Amoxicillin, Aspirin, Barbiturates, Celebrex [celecoxib], Codeine, Evista [raloxifene], Lipitor [atorvastatin], Morphine and related, Oruvail [ketoprofen], Pantoprazole, Paraffin, Prochlorperazine, Proloprim [trimethoprim], Venlafaxine, Vibra-tab [doxycycline], Vioxx [rofecoxib], Betadine [povidone iodine], and Caffeine    Social History:  The patient  reports that she  has never smoked. She has never used smokeless tobacco. She reports that she does not drink alcohol and does not use drugs.   Family History:  The patient's family history includes Breast cancer (age of onset: 22) in her sister; Cancer in her sister, sister, and sister; Colon cancer in her sister; Heart attack in her father; Heart disease in her father, mother, and sister; Hyperlipidemia in her daughter; Hypertension in her daughter.    ROS:  Please see the history of present illness.   Otherwise, review of systems are positive for none.   All other systems are reviewed and negative.    PHYSICAL EXAM: VS:  BP (!) 184/80 (BP Location: Left Arm, Patient Position: Sitting, Cuff Size: Normal)   Pulse (!) 54   Ht 5\' 4"  (1.626 m)   Wt 49.7 kg   SpO2 99%   BMI 18.81 kg/m  , BMI Body mass index is 18.81 kg/m. GENERAL:  Well appearing HEENT:  Pupils equal round and reactive, fundi not visualized, oral mucosa unremarkable NECK:  No jugular venous distention, waveform within normal limits, carotid upstroke brisk and symmetric, no bruits, no thyromegaly LYMPHATICS:  No cervical adenopathy LUNGS:  Clear to auscultation bilaterally HEART:  Irregularly irregular.  PMI not displaced or sustained,S1 and S2 within normal limits, no S3, no S4, no clicks, no rubs, no murmurs ABD:   Flat, positive bowel sounds normal in frequency in pitch, no bruits, no rebound, no guarding, no midline pulsatile mass, no hepatomegaly, no splenomegaly EXT:  2 plus pulses throughout, no edema, no cyanosis no clubbing SKIN:  No rashes no nodules NEURO:  Cranial nerves II through XII grossly intact, motor grossly intact throughout PSYCH:  Cognitively intact, oriented to person place and time   EKG:  EKG is not ordered today. The ekg ordered today demonstrates   Echo 10/11/2019  1. Left ventricular ejection fraction, by estimation, is 55 to 60%. The  left ventricle has normal function. The left ventricle has no regional  wall motion abnormalities. Left ventricular diastolic function could not  be evaluated.   2. Right ventricular systolic function is normal. The right ventricular  size is normal. There is normal pulmonary artery systolic pressure. The  estimated right ventricular systolic pressure is 74.1 mmHg.   3. Left atrial size was severely dilated.   4. Right atrial size was severely dilated.   5. The mitral valve is grossly normal. Mild mitral valve regurgitation.  No evidence of mitral stenosis.   6. Tricuspid valve regurgitation is moderate.   7. The aortic valve is tricuspid. Aortic valve regurgitation is not  visualized. Mild aortic valve sclerosis is present, with no evidence of  aortic valve stenosis.   8. The inferior vena cava is normal in size with greater than 50%  respiratory variability, suggesting right atrial pressure of 3 mmHg.   7 Day Event Monitor 03/2018: (patient wore the monitor for 6 hours)  Quality: Fair.  Baseline artifact. Predominant rhythm: sinus bradycardia Average heart rate: 59 bpm Max heart rate: 77 bpm Min heart rate: 48 bpm Pauses >2.5 seconds: none  No arrhythmias noted  7 Day Event Monitor 12/21/2019:   Quality: Fair.  Baseline artifact. Predominant rhythm: atrial fibrillation       Average heart rate: 68 bpm Max heart rate: 193  bpm Min heart rate: 43 bpm   Occasional PVCs  Patient was in atrial fibrillation throughout the study.   Recent Labs: 10/20/2020: ALT 9; BUN 20; Creat 0.83; Magnesium 2.0; Potassium  4.1; Sodium 136; TSH 0.71 01/03/2021: Hemoglobin 11.7; Platelets 325    Lipid Panel    Component Value Date/Time   CHOL 165 10/20/2020 1448   TRIG 154 (H) 10/20/2020 1448   HDL 78 10/20/2020 1448   CHOLHDL 2.1 10/20/2020 1448   VLDL 31 (H) 02/05/2017 1149   LDLCALC 63 10/20/2020 1448      Wt Readings from Last 3 Encounters:  03/14/21 49.7 kg  01/03/21 51.5 kg  01/03/21 51.5 kg      ASSESSMENT AND PLAN:  1. Paroxysmal atrial fibrillation (HCC)   2. Essential hypertension   3. Hypercholesterolemia   4. Aortic atherosclerosis ([place]) by Abd CT scan (05/2020)       Paroxysmal atrial fibrillation: This has been minimally symptomatic and she is essentially unaware of it.  On atenolol for rate control and on Eliquis for stroke/embolism protection.  CHA2DS2-VASc score is at least 7 (age 71, previous embolism 2,  gender, hypertension, PAD - as marked by the presence of aortic atherosclerosis). HTN: Markedly elevated blood pressure today, but this appears to be a one-time anomaly.  No changes were made to her medications.  Atenolol should not be increased since she has bradycardia.  The olmesartan doses maximum.  If necessary would add a low-dose of amlodipine rather than a diuretic. HLP: Lipid parameters are excellent on statin.  Continue the same medication.  Patient Instructions  Medication Instructions:  No changes *If you need a refill on your cardiac medications before your next appointment, please call your pharmacy*   Lab Work: None ordered If you have labs (blood work) drawn today and your tests are completely normal, you will receive your results only by: [place] (if you have MyChart) OR A paper copy in the mail If you have any lab test that is abnormal or we need to change your  treatment, we will call you to review the results.   Testing/Procedures: None ordered   Follow-Up: At [facility], you and your health needs are our priority.  As part of our continuing mission to provide you with exceptional heart care, we have created designated Provider Care Teams.  These Care Teams include your primary Cardiologist (physician) and Advanced Practice Providers (APPs -  Physician Assistants and Nurse Practitioners) who all work together to provide you with the care you need, when you need it.  We recommend signing up for the patient portal called "MyChart".  Sign up information is provided on this After Visit Summary.  MyChart is used to connect with patients for Virtual Visits (Telemedicine).  Patients are able to view lab/test results, encounter notes, upcoming appointments, etc.  Non-urgent messages can be sent to your provider as well.   To learn more about what you can do with MyChart, go to [email or web address].    Your next appointment:   12 month(s)  The format for your next appointment:   In Person  Provider:   You may see Dr. Sallyanne Kuster or one of the following Advanced Practice Providers on your designated Care Team:   Almyra Deforest, PA-C Fabian Sharp, [place] or  Roby Lofts, [place]   Other Instructions Dr. Sallyanne Kuster would like you to check your blood pressure daily for the next week.  Keep a journal of these daily blood pressure and heart rate readings and call our office or send a message through [place] with the results. Thank you!  It is best to check your BP 1-2 hours after taking your medications to see the medications effectiveness on  your BP.    Here are some tips that our clinical pharmacists share for home BP monitoring:          Rest 10 minutes before taking your blood pressure.          Don't smoke or drink caffeinated beverages for at least 30 minutes before.          Take your blood pressure before (not after) you eat.          Sit  comfortably with your back supported and both feet on the floor (don't cross your legs).          Elevate your arm to heart level on a table or a desk.          Use the proper sized cuff. It should fit smoothly and snugly around your bare upper arm. There should be enough room to slip a fingertip under the cuff. The bottom edge of the cuff should be 1 inch above the crease of the elbow.  Sanda Klein, MD, [facility] [phone] office [phone] pager

## 2021-03-14 NOTE — Patient Instructions (Signed)
Medication Instructions:  No changes *If you need a refill on your cardiac medications before your next appointment, please call your pharmacy*   Lab Work: None ordered If you have labs (blood work) drawn today and your tests are completely normal, you will receive your results only by: [place] (if you have MyChart) OR A paper copy in the mail If you have any lab test that is abnormal or we need to change your treatment, we will call you to review the results.   Testing/Procedures: None ordered   Follow-Up: At [facility], you and your health needs are our priority.  As part of our continuing mission to provide you with exceptional heart care, we have created designated Provider Care Teams.  These Care Teams include your primary Cardiologist (physician) and Advanced Practice Providers (APPs -  Physician Assistants and Nurse Practitioners) who all work together to provide you with the care you need, when you need it.  We recommend signing up for the patient portal called "MyChart".  Sign up information is provided on this After Visit Summary.  MyChart is used to connect with patients for Virtual Visits (Telemedicine).  Patients are able to view lab/test results, encounter notes, upcoming appointments, etc.  Non-urgent messages can be sent to your provider as well.   To learn more about what you can do with MyChart, go to [email or web address].    Your next appointment:   12 month(s)  The format for your next appointment:   In Person  Provider:   You may see Dr. Sallyanne Kuster or one of the following Advanced Practice Providers on your designated Care Team:   Almyra Deforest, PA-C Fabian Sharp, [place] or  Roby Lofts, [place]   Other Instructions Dr. Sallyanne Kuster would like you to check your blood pressure daily for the next week.  Keep a journal of these daily blood pressure and heart rate readings and call our office or send a message through [place] with the results. Thank you!  It  is best to check your BP 1-2 hours after taking your medications to see the medications effectiveness on your BP.    Here are some tips that our clinical pharmacists share for home BP monitoring:          Rest 10 minutes before taking your blood pressure.          Don't smoke or drink caffeinated beverages for at least 30 minutes before.          Take your blood pressure before (not after) you eat.          Sit comfortably with your back supported and both feet on the floor (don't cross your legs).          Elevate your arm to heart level on a table or a desk.          Use the proper sized cuff. It should fit smoothly and snugly around your bare upper arm. There should be enough room to slip a fingertip under the cuff. The bottom edge of the cuff should be 1 inch above the crease of the elbow.

## 2021-03-16 ENCOUNTER — Telehealth: Payer: Self-pay | Admitting: Internal Medicine

## 2021-03-16 NOTE — Chronic Care Management (AMB) (Signed)
  Chronic Care Management   Outreach Note  03/16/2021 Name: Debra White MRN: [id number] DOB: [birth year]  Referred by: Unk Pinto, MD Reason for referral : No chief complaint on file.   An unsuccessful telephone outreach was attempted today. The patient was referred to the pharmacist for assistance with care management and care coordination.   Follow Up Plan:   Tatjana Dellinger Upstream Scheduler

## 2021-03-18 ENCOUNTER — Encounter: Payer: Self-pay | Admitting: Cardiovascular Disease

## 2021-03-23 ENCOUNTER — Telehealth: Payer: Self-pay | Admitting: Internal Medicine

## 2021-03-23 NOTE — Progress Notes (Signed)
  Chronic Care Management   Note  03/23/2021 Name: Lucianne Smestad MRN: [id number] DOB: 1933/11/26  Marinell Blight Marcantel is a 87 y.o. year old female who is a primary care patient of Unk Pinto, MD. I reached out to [name] by phone today in response to a referral sent by Ms. Amirah Absher Mazzola's PCP, Unk Pinto, MD.   Ms. Frayne was given information about Chronic Care Management services today including:  CCM service includes personalized support from designated clinical staff supervised by her physician, including individualized plan of care and coordination with other care providers 24/7 contact phone numbers for assistance for urgent and routine care needs. Service will only be billed when office clinical staff spend 20 minutes or more in a month to coordinate care. Only one practitioner may furnish and bill the service in a calendar month. The patient may stop CCM services at any time (effective at the end of the month) by phone call to the office staff.   [name] GRUER verbally agreed to assistance and services provided by embedded care coordination/care management team today.  Follow up plan:   Tatjana Secretary/administrator

## 2021-04-11 ENCOUNTER — Other Ambulatory Visit: Payer: Self-pay

## 2021-04-11 ENCOUNTER — Ambulatory Visit
Admission: RE | Admit: 2021-04-11 | Discharge: 2021-04-11 | Disposition: A | Discharge status: Home / Self Care | Payer: PPO | Source: Ambulatory Visit | Attending: Adult Health | Admitting: Adult Health

## 2021-04-11 DIAGNOSIS — Z780 Asymptomatic menopausal state: Secondary | ICD-10-CM | POA: Diagnosis not present

## 2021-04-11 DIAGNOSIS — M810 Age-related osteoporosis without current pathological fracture: Secondary | ICD-10-CM

## 2021-04-12 ENCOUNTER — Other Ambulatory Visit: Payer: Self-pay | Admitting: Adult Health

## 2021-04-12 DIAGNOSIS — M810 Age-related osteoporosis without current pathological fracture: Secondary | ICD-10-CM

## 2021-04-12 MED ORDER — ALENDRONATE SODIUM 70 MG PO TABS: 70.0000 mg | ORAL_TABLET | ORAL | 3 refills | Status: DC

## 2021-04-12 MED ORDER — ALENDRONATE SODIUM 70 MG PO TABS
70.0000 mg | ORAL_TABLET | ORAL | 3 refills | Status: DC
Start: 2021-04-12 — End: 2021-06-22

## 2021-04-13 ENCOUNTER — Telehealth: Payer: Self-pay | Admitting: *Deleted

## 2021-04-13 MED ORDER — AMLODIPINE BESYLATE 2.5 MG PO TABS: 2.5000 mg | ORAL_TABLET | Freq: Every day | ORAL | 3 refills | Status: DC

## 2021-04-13 NOTE — Telephone Encounter (Signed)
Called and spoke with the patient's sister per the DPR. The patient had dropped off some blood pressure readings. Per Dr. Sallyanne Kuster, the patient should add Amlodipine 2.5 mg once daily and give blood pressure readings in two weeks. The sister has verbalized her understanding.

## 2021-04-25 NOTE — Progress Notes (Deleted)
Complete Physical  Assessment:   Encounter for General Adult medical exam with abnormal findings Due annually   Tortuous aorta ([place]) Control blood pressure, cholesterol, glucose, increase exercise.   Renal infarct ([place]) Continue on elliquis  Atrial fibrillation with controlled ventricular response (HCC)/ thombophilia (HCC) Continue elliquis, she is rate controlled, continue follow up cardio No concerns with bleeding  Aortic atherosclerosis (HCC) Control blood pressure, cholesterol, glucose, increase exercise.   Chronic diastolic congestive heart failure (HCC) Monitor, no symptoms of fluid overload at this time, patient having weight loss, no swelling, no PND, orthopenia  Pulmonary emphysema, unspecified emphysema type ([place]) Per CXR; likely age related; denies sx; monitor  Mild malnutrition ([place]) Resume remeron PRN appetite if weights trending back down  -     COMPLETE METABOLIC PANEL WITH GFR  Essential hypertension -     CBC with Differential/Platelet -     COMPLETE METABOLIC PANEL WITH GFR -     TSH - continue medications, DASH diet, exercise and monitor at home. Call if greater than 130/80.   Coronary atherosclerosis due to lipid rich plaque Control blood pressure, cholesterol, glucose, increase exercise.   Vitamin D deficiency Continue supplement  Medication management -     Magnesium  Mixed hyperlipidemia -     Lipid panel check lipids decrease fatty foods increase activity.    History of embolic stroke Continue eliquis Control blood pressure, cholesterol, glucose, increase exercise.   Chronic post-traumatic headache, not intractable Continue klonopin, try to limit use Gabapentin helpful  Atypical facial pain Gabapentin helpful   Hepatic steatosis Check labs, avoid tylenol, alcohol   Gastroesophageal reflux disease, unspecified whether esophagitis present Continue PPI/H2 blocker, diet discussed  Osteoporosis, unspecified osteoporosis type,  unspecified pathological fracture presence Due for DEXA, discussed and order placed, patient given number to schedule Continue Vit D and high calcium diet, weight bearing exercises encouraged  Abnormal glucose/ Prediabetes Discussed disease and risks Discussed diet/exercise, weight management  A1C q32m, check CMP    Over 40 minutes of exam, counseling, chart review and critical decision making was performed Future Appointments  Date Time Provider [place]  04/28/2021 10:00 AM Magda Bernheim, NP [facility] None  05/30/2021  1:30 PM Newton Pigg, RPH [facility] None  10/20/2021 11:00 AM Magda Bernheim, NP [facility] None      Subjective:  Debra White is a 87 y.o. female who presents for Medicare Annual Wellness Vist and 3 Month follow up.   Husband has dementia, she is primary caregiver for husband.   She reports GERD is well controlled by omeprazole 20 mg daily. Recently had diverticulitis flare and was treated by cipro/flagyl, reports sx fully resolved. Uses bentyl occasionally for cramping and diarrhea.   She has residual face pain related to fall and zygomatic fracture with hardware implanted, manages pain with gabapentin 600 mg TID.   BMI is There is no height or weight on file to calculate BMI., she is working on diet, minimal intentional exercise but active as caregiver. Uses remeron intermittently to help with appetite and maintain weight.  Wt Readings from Last 3 Encounters:  03/14/21 109 lb 9.6 oz (49.7 kg)  01/03/21 113 lb 9.6 oz (51.5 kg)  01/03/21 113 lb 9.6 oz (51.5 kg)   She had splenic and renal infarcts in 2018, history of CVA and on Eliquis 2.5 mg BID for Afib.  CAD, diastolic CHF, Echo 1610 EF 55-60% with severe LAE. Declines TEE and loop recorder, follows with cardiology Dr. Oval Linsey. She also has  aortic atherosclerosis per CT 05/2020.    Her blood pressure has been controlled at home, today their BP is   She does not workout. She denies chest  pain, shortness of breath, dizziness.   She is on cholesterol medication (lovastatin 20 mg daily) and denies myalgias at the current dose, had myalgias with higher doses. Her cholesterol is not at goal. The cholesterol last visit was:   Lab Results  Component Value Date   CHOL 165 10/20/2020   HDL 78 10/20/2020   LDLCALC 63 10/20/2020   TRIG 154 (H) 10/20/2020   CHOLHDL 2.1 10/20/2020   She has prediabetes.  has not been working on diet and exercise for prediabetes, and denies hyperglycemia, hypoglycemia , nausea, polydipsia, polyuria and visual disturbances. Last A1C in the office was:  Lab Results  Component Value Date   HGBA1C 5.7 (H) 10/20/2020   Last GFR: Lab Results  Component Value Date   GFRNONAA 65 10/20/2020   Patient is on Vitamin D supplement.   Lab Results  Component Value Date   VD25OH 93 12/15/2019    Medication Review:  Current Outpatient Medications (Endocrine & Metabolic):    alendronate (FOSAMAX) 70 MG tablet, Take 1 tablet (70 mg total) by mouth every 7 (seven) days. Take with a full glass of water on an empty stomach. Avoid reclining for 30 min after taking this medication.   dexamethasone (DECADRON) 4 MG tablet, Take 1 tab 3 x /day for 2 days,      then 2 x /day for 2  Days,     then 1 tab daily (Patient not taking: Reported on 03/14/2021)  Current Outpatient Medications (Cardiovascular):    amLODipine (NORVASC) 2.5 MG tablet, Take 1 tablet (2.5 mg total) by mouth daily.   atenolol (TENORMIN) 100 MG tablet, Take 1 tablet daily for blood pressure   lovastatin (MEVACOR) 20 MG tablet, Take 1 tablet at Bedtime for Cholesterol   olmesartan (BENICAR) 40 MG tablet, Take  1 tablet  Daily for BP  Current Outpatient Medications (Respiratory):    promethazine-dextromethorphan (PROMETHAZINE-DM) 6.25-15 MG/5ML syrup, Take 1 tsp every 4 hours if needed for cough  Current Outpatient Medications (Analgesics):    acetaminophen (TYLENOL) 500 MG tablet, Take 1,000 mg by  mouth every 8 (eight) hours as needed for headache.   Current Outpatient Medications (Hematological):    apixaban (ELIQUIS) 2.5 MG TABS tablet, Take  1 Tablet  2 x /day (every 12 hours) to Prevent Blood Clots   Cyanocobalamin (VITAMIN B 12 PO), Take 1 tablet by mouth daily.  Current Outpatient Medications (Other):    Cholecalciferol (VITAMIN D-3) 125 MCG (5000 UT) TABS, Take 5,000 Units by mouth daily.   clonazePAM (KLONOPIN) 1 MG tablet, TAKE 1/2 TO 1 TABLET BY MOUTH 2-3 TIMES DAILY AS NEEDED FOR ANXIETY ATTACKS LIMIT TO 5 DAYS A WEEK TO AVOID ADDICTION AND DEMENTIA   conjugated estrogens (PREMARIN) vaginal cream, Place 1 Applicatorful vaginally See admin instructions. Apply one applicatorful vaginally once a week   diclofenac Sodium (VOLTAREN) 1 % GEL, APPLY 2 GRAMS TOPICALLY 3-4 TIMES DAILY AS NEEDED FOR PAIN   dicyclomine (BENTYL) 20 MG tablet, TAKE 1 TABLET BY MOUTH FOUR TIMES DAILY WITH MEALS AND BEDTIME FOR CRAMPING  NAUSEA  OR BLOATING (Patient taking differently: TAKE 1 TABLET BY MOUTH FOUR TIMES DAILY WITH MEALS AND BEDTIME FOR CRAMPING  NAUSEA  OR BLOATING)   gabapentin (NEURONTIN) 600 MG tablet, Take  1 tablet  3 x /day  for Facial Pain  omeprazole (PRILOSEC) 40 MG capsule, TAKE 1 CAPSULE BY MOUTH DAILY BEFORE BREAKFAST   polyvinyl alcohol (LIQUIFILM TEARS) 1.4 % ophthalmic solution, Place 1 drop into both eyes 2 (two) times daily as needed for dry eyes.   Vaginal Lubricant (REPLENS) GEL, Place vaginally daily.   Allergies  Allergen Reactions   Latex Hives    TONGUE HAD BLISTERS WHEN HAD DENTAL SURGERY   Amoxicillin Nausea And Vomiting   Aspirin Other (See Comments)    Gi upset   Barbiturates Other (See Comments)    Unknown reaction   Celebrex [Celecoxib] Other (See Comments)    Unknown reaction   Codeine Itching   Evista [Raloxifene] Other (See Comments)    Unknown reaction   Lipitor [Atorvastatin] Other (See Comments)    Unknown reaction   Morphine And Related Other  (See Comments)    Unknown reaction   Oruvail [Ketoprofen] Other (See Comments)    Unknown reaction   Pantoprazole Diarrhea   Paraffin Other (See Comments)    Unknown reaction   Prochlorperazine Other (See Comments)    Uncontrolled shaking   Proloprim [Trimethoprim] Other (See Comments)    Unknown reaction   Venlafaxine Nausea Only   Vibra-Tab [Doxycycline] Other (See Comments)    Unknown reaction   Vioxx [Rofecoxib] Other (See Comments)    edema   Betadine [Povidone Iodine] Rash   Caffeine Palpitations    Current Problems (verified) Patient Active Problem List   Diagnosis Date Noted   Thrombophilia ([place]) 10/20/2020   Iron deficiency anemia 05/31/2020   Epigastric discomfort 11/19/2019   Anxiety 64/33/2951   Diastolic CHF ([place]) 88/41/6606   History of embolic stroke 30/16/0109   Atrial fibrillation with controlled ventricular response ([place]) 03/12/2019   Coronary atherosclerosis due to lipid rich plaque 03/26/2018   Renal infarct ([place]) 03/22/2018   Hyponatremia 03/22/2018   Chronic hyperkalemia 03/22/2018   Hepatic steatosis 11/17/2017   Aortic atherosclerosis (HCC) by Abd CT scan (05/2020)  08/08/2017   COPD (chronic obstructive pulmonary disease) with emphysema ([place]) 12/21/2015   Tortuous aorta (HCC) 12/21/2015   Osteoporosis 12/21/2015   Mild malnutrition ([place]) 05/11/2015   Encounter for Medicare annual wellness exam 04/08/2015   Medication management 01/27/2014   Atypical facial pain 10/08/2013   Vitamin D deficiency 07/08/2013   Hypertension    Hyperlipidemia    GERD    Headache, post-traumatic, chronic 09/20/2011    Screening Tests Immunization History  Administered Date(s) Administered   Influenza, High Dose Seasonal PF 03/25/2014, 04/05/2016, 06/02/2018, 03/12/2019, 03/23/2020   Influenza-Unspecified 03/09/2013, 03/11/2015   PFIZER(Purple Top)SARS-COV-2 Vaccination 06/01/2020, 06/22/2020   Pneumococcal Conjugate-13 12/21/2015   Pneumococcal  Polysaccharide-23 06/25/2002   Td 06/26/2003   Preventative care: Last colonoscopy: 2013 EGD 2013  Last mammogram: 09/2020 DEXA: 2017  Prior vaccinations: TD or Tdap: 2005, declines, will get PRN Influenza: 2021  Pneumococcal: 2004 Prevnar13: 2017 Shingles/Zostavax: N/A Covid 19: 2/2, 2021  Names of Other Physician/Practitioners you currently use: 1. [facility] here for primary care 2.  Eye Exam, Dr. Sabra Heck 2022 3. Dentist 2022  Patient Care Team: Unk Pinto, MD as PCP - General (Internal Medicine) Sanda Klein, MD as PCP - Cardiology (Cardiology) Darleen Crocker, MD as Consulting Physician (Ophthalmology) Ladene Artist, MD as Consulting Physician (Gastroenterology) Cameron Sprang, MD as Consulting Physician (Neurology) Jodi Marble, MD as Consulting Physician (Otolaryngology) Rush Landmark, [facility] as Pharmacist (Pharmacist)  SURGICAL HISTORY She  has a past surgical history that includes Abdominal hysterectomy; Hand surgery; Shoulder  arthroscopy w/ rotator cuff repair; Cervical disc surgery; Back surgery; ORIF tripod fracture (07/13/2011); Colonoscopy; Facial fracture surgery; Back surgery; Ptosis repair (Bilateral, 11/22/2016); and Breast excisional biopsy. FAMILY HISTORY Her family history includes Breast cancer (age of onset: 38) in her sister; Cancer in her sister, sister, and sister; Colon cancer in her sister; Heart attack in her father; Heart disease in her father, mother, and sister; Hyperlipidemia in her daughter; Hypertension in her daughter. SOCIAL HISTORY She  reports that she has never smoked. She has never used smokeless tobacco. She reports that she does not drink alcohol and does not use drugs.    Review of Systems  Constitutional:  Negative for chills, fever, malaise/fatigue and weight loss.  HENT:  Negative for congestion, hearing loss, sinus pain, sore throat and tinnitus.   Eyes:  Negative for blurred vision  and double vision.  Respiratory:  Negative for cough, hemoptysis, sputum production, shortness of breath and wheezing.        Chronic facial pain, well controlled  Cardiovascular:  Negative for chest pain, palpitations, orthopnea, claudication, leg swelling and PND.  Gastrointestinal:  Negative for abdominal pain, blood in stool, constipation, diarrhea (intermittent), heartburn, melena, nausea and vomiting.  Genitourinary: Negative.  Negative for dysuria and urgency.  Musculoskeletal:  Negative for back pain, falls, joint pain, myalgias and neck pain.  Skin:  Negative for rash.  Neurological:  Negative for dizziness, tingling, tremors, sensory change, weakness and headaches.  Endo/Heme/Allergies:  Negative for polydipsia. Does not bruise/bleed easily.  Psychiatric/Behavioral: Negative.  Negative for depression, memory loss, substance abuse and suicidal ideas. The patient is not nervous/anxious and does not have insomnia.   All other systems reviewed and are negative.   Objective:     There were no vitals filed for this visit.  There is no height or weight on file to calculate BMI.  General appearance: alert, no distress, WD/WN, thin elderly female HEENT: normocephalic, sclerae anicteric, TMs pearly right, left cerumen impaction, nares patent, no discharge or erythema, pharynx normal. Mildly HOH, hearing aids.  Oral cavity: MMM, no lesions Neck: supple, no lymphadenopathy, no thyromegaly, no masses Heart: RRR, normal S1, S2, no murmurs Lungs: CTA bilaterally, no wheezes, rhonchi, or rales Abdomen: +bs, soft, diffuse tenderness, non distended, no masses, no hepatomegaly, no splenomegaly Musculoskeletal: nontender, no swelling, no obvious deformity Extremities: no edema, no cyanosis, no clubbing Pulses: 2+ symmetric, upper and lower extremities, normal cap refill Neurological: alert, oriented x 3, CN2-12 intact, strength normal upper extremities and lower extremities, sensation normal  throughout, DTRs 2+ throughout, no cerebellar signs, gait normal  Psychiatric: normal affect, behavior normal, pleasant    Magda Bernheim, NP   06/16/2019

## 2021-04-28 ENCOUNTER — Encounter: Payer: PPO | Admitting: Nurse Practitioner

## 2021-04-28 DIAGNOSIS — I700 Atherosclerosis of aorta: Secondary | ICD-10-CM

## 2021-04-28 DIAGNOSIS — I10 Essential (primary) hypertension: Secondary | ICD-10-CM

## 2021-04-28 DIAGNOSIS — I771 Stricture of artery: Secondary | ICD-10-CM

## 2021-04-28 DIAGNOSIS — K760 Fatty (change of) liver, not elsewhere classified: Secondary | ICD-10-CM

## 2021-04-28 DIAGNOSIS — Z79899 Other long term (current) drug therapy: Secondary | ICD-10-CM

## 2021-04-28 DIAGNOSIS — E782 Mixed hyperlipidemia: Secondary | ICD-10-CM

## 2021-04-28 DIAGNOSIS — N280 Ischemia and infarction of kidney: Secondary | ICD-10-CM

## 2021-04-28 DIAGNOSIS — G501 Atypical facial pain: Secondary | ICD-10-CM

## 2021-04-28 DIAGNOSIS — E559 Vitamin D deficiency, unspecified: Secondary | ICD-10-CM

## 2021-04-28 DIAGNOSIS — Z8673 Personal history of transient ischemic attack (TIA), and cerebral infarction without residual deficits: Secondary | ICD-10-CM

## 2021-04-28 DIAGNOSIS — M810 Age-related osteoporosis without current pathological fracture: Secondary | ICD-10-CM

## 2021-04-28 DIAGNOSIS — K219 Gastro-esophageal reflux disease without esophagitis: Secondary | ICD-10-CM

## 2021-04-28 DIAGNOSIS — J439 Emphysema, unspecified: Secondary | ICD-10-CM

## 2021-04-28 DIAGNOSIS — Z0001 Encounter for general adult medical examination with abnormal findings: Secondary | ICD-10-CM

## 2021-04-28 DIAGNOSIS — Z136 Encounter for screening for cardiovascular disorders: Secondary | ICD-10-CM

## 2021-04-28 DIAGNOSIS — E441 Mild protein-calorie malnutrition: Secondary | ICD-10-CM

## 2021-04-28 DIAGNOSIS — R7309 Other abnormal glucose: Secondary | ICD-10-CM

## 2021-04-28 DIAGNOSIS — I5032 Chronic diastolic (congestive) heart failure: Secondary | ICD-10-CM

## 2021-04-28 DIAGNOSIS — G44329 Chronic post-traumatic headache, not intractable: Secondary | ICD-10-CM

## 2021-04-28 DIAGNOSIS — I2510 Atherosclerotic heart disease of native coronary artery without angina pectoris: Secondary | ICD-10-CM

## 2021-04-28 DIAGNOSIS — I4891 Unspecified atrial fibrillation: Secondary | ICD-10-CM

## 2021-05-01 ENCOUNTER — Other Ambulatory Visit: Payer: Self-pay | Admitting: Internal Medicine

## 2021-05-01 NOTE — Progress Notes (Signed)
Future Appointments  Date Time Provider [place]  05/02/2021  9:30 AM Unk Pinto, MD [facility] None  05/30/2021  1:30 PM Newton Pigg, RPH [facility] None  10/20/2021 11:00 AM Magda Bernheim, NP [facility] None    History of Present Illness:     Patient is a very nice 87 yo MWF  with HTN, ASHD / chAfib,  HLD, GERD, IBS, Prediabetes  and Vitamin D Deficiency. Her HTN predates from 110 and in 2019, she was started on Eliquis for hx/o Splenic and Rt Renal infarcts and in June 2020, she was discovered in Afib.   Patient has hx/o T2_DM (A1c 6.8% /2001) , but after 30# weight loss in 2013, her A1c had dropped to 5.8% and then to  5.3% in 2016.       She presents now with c/o back pain last week which has resolved. Her husband with severe Dementia has been in the hospital for about a week, so she's had a "break of sorts" .   Apparently his Dementia has worsened to the point that the Hospitalists are trying to place him. She has her usual complaints of "I just don't feel good."   Medications  Current Outpatient Medications (Endocrine & Metabolic):    alendronate (FOSAMAX) 70 MG tablet, Take 1 tablet (70 mg total) by mouth every 7 (seven) days. Take with a full glass of water on an empty stomach. Avoid reclining for 30 min after taking this medication.   dexamethasone (DECADRON) 4 MG tablet, Take 1 tab 3 x /day for 2 days,      then 2 x /day for 2  Days,     then 1 tab daily (Patient not taking: Reported on 03/14/2021)  Current Outpatient Medications (Cardiovascular):    amLODipine (NORVASC) 2.5 MG tablet, Take 1 tablet (2.5 mg total) by mouth daily.   atenolol (TENORMIN) 100 MG tablet, Take 1 tablet daily for blood pressure   lovastatin (MEVACOR) 20 MG tablet, Take 1 tablet at Bedtime for Cholesterol   olmesartan (BENICAR) 40 MG tablet, Take  1 tablet  Daily for BP    promethazine-DM) 6.25-15 MG/5ML syrup, Take 1 tsp every 4 hours if needed for cough     acetaminophen  500 MG  tablet, Take 1,000 mg every 8 hours as needed for headache.     ELIQUIS  2.5 MG TABS tablet, Take  1 Tablet  2 x /day   VITAMIN B 12 , Take 1 tablet  daily.    VITAMIN D 5000 u Take daily.   clonazePAM 1 MG tablet, TAKE 1/2 TO 1 TABLET  2-3 TIMES DAILY AS NEEDED    PREMARIN vaginal cream,  Apply vaginally once a week   diclofenac 1 % GEL, APPLY 2 GRAMS TOPICALLY 3-4 TIMES DAILY AS NEEDED FOR PAIN   dicyclomine (BENTYL) 20 MG tablet, TAKE 1 TABLET BY MOUTH FOUR TIMES DAILY WITH MEALS AND BEDTIME FOR CRAMPING  NAUSEA  OR BLOATING (Patient taking differently: TAKE 1 TABLET BY MOUTH FOUR TIMES DAILY WITH MEALS AND BEDTIME FOR CRAMPING  NAUSEA  OR BLOATING)   gabapentin (NEURONTIN) 600 MG tablet, Take  1 tablet  3 x /day  for Facial Pain   omeprazole (PRILOSEC) 40 MG capsule, TAKE 1 CAPSULE BY MOUTH DAILY BEFORE BREAKFAST   polyvinyl alcohol (LIQUIFILM TEARS) 1.4 % ophthalmic solution, Place 1 drop into both eyes 2 (two) times daily as needed for dry eyes.   Vaginal Lubricant (REPLENS) GEL, Place vaginally daily.  Problem  list She has Headache, post-traumatic, chronic; Hypertension; Hyperlipidemia; GERD; Vitamin D deficiency; Atypical facial pain; Medication management; Encounter for Medicare annual wellness exam; Mild malnutrition ([place]); COPD (chronic obstructive pulmonary disease) with emphysema ([place]); Tortuous aorta ([place]); Osteoporosis; Aortic atherosclerosis ([place]) by Abd CT scan (05/2020) ; Hepatic steatosis; Renal infarct ([place]); Hyponatremia; Chronic hyperkalemia; Coronary atherosclerosis due to lipid rich plaque; Atrial fibrillation with controlled ventricular response ([place]); Diastolic CHF ([place]); History of embolic stroke; Epigastric discomfort; Anxiety; Iron deficiency anemia; and Thrombophilia ([place]) on their problem list.   Observations/Objective:  BP 131/63   Pulse (!) 56   Temp (!) 97.5 F (36.4 C)   Resp 16   Ht 5\' 4"  (1.626 m)   Wt 106 lb 9.6 oz (48.4 kg)   SpO2 98%   BMI 18.30  kg/m   HEENT - WNL. Neck - supple.  Chest - Clear equal BS. Cor - Nl HS. RRR w/o sig MGR. PP 1(+). No edema. MS- FROM w/o deformities. Sl tender low para lumbar area.  Gait Nl. Neuro -  Nl w/o focal abnormalities.   Assessment and Plan:  1. Essential hypertension  - CBC with Differential/Platelet - COMPLETE METABOLIC PANEL WITH GFR  2. Chronic low back pain    3. Atrial fibrillation with controlled ventricular response ([place])   4. Malaise and fatigue  - CBC with Differential/Platelet - COMPLETE METABOLIC PANEL WITH GFR  5. Need for immunization against influenza  - Flu vaccine HIGH DOSE PF (Fluzone High dose)  6. Medication management  - CBC with Differential/Platelet - COMPLETE METABOLIC PANEL WITH GFR    Follow Up Instructions:        I discussed the assessment and treatment plan with proper bending & lifting techniques and recommended stretching exercises and trial with heating pad. The patient was provided an opportunity to ask questions and all were answered. The patient agreed with the plan and demonstrated an understanding of the instructions.       The patient was advised to call back or seek an in-person evaluation if the symptoms worsen or if the condition fails to improve as anticipated.   Kirtland Bouchard, MD

## 2021-05-02 ENCOUNTER — Other Ambulatory Visit: Payer: Self-pay

## 2021-05-02 ENCOUNTER — Encounter: Payer: Self-pay | Admitting: Internal Medicine

## 2021-05-02 ENCOUNTER — Ambulatory Visit (INDEPENDENT_AMBULATORY_CARE_PROVIDER_SITE_OTHER): Payer: PPO | Admitting: Internal Medicine

## 2021-05-02 VITALS — BP 131/63 | HR 56 | Temp 97.5°F | Resp 16 | Ht 64.0 in | Wt 106.6 lb

## 2021-05-02 DIAGNOSIS — I4891 Unspecified atrial fibrillation: Secondary | ICD-10-CM | POA: Diagnosis not present

## 2021-05-02 DIAGNOSIS — G8929 Other chronic pain: Secondary | ICD-10-CM

## 2021-05-02 DIAGNOSIS — R5381 Other malaise: Secondary | ICD-10-CM | POA: Diagnosis not present

## 2021-05-02 DIAGNOSIS — Z23 Encounter for immunization: Secondary | ICD-10-CM

## 2021-05-02 DIAGNOSIS — M545 Low back pain, unspecified: Secondary | ICD-10-CM | POA: Diagnosis not present

## 2021-05-02 DIAGNOSIS — I10 Essential (primary) hypertension: Secondary | ICD-10-CM

## 2021-05-02 DIAGNOSIS — R5383 Other fatigue: Secondary | ICD-10-CM

## 2021-05-02 DIAGNOSIS — Z79899 Other long term (current) drug therapy: Secondary | ICD-10-CM | POA: Diagnosis not present

## 2021-05-03 LAB — COMPLETE METABOLIC PANEL WITH GFR
AG Ratio: 1.7 (calc) (ref 1.0–2.5)
ALT: 9 U/L (ref 6–29)
AST: 17 U/L (ref 10–35)
Albumin: 4.7 g/dL (ref 3.6–5.1)
Alkaline phosphatase (APISO): 85 U/L (ref 37–153)
BUN: 13 mg/dL (ref 7–25)
CO2: 27 mmol/L (ref 20–32)
Calcium: 9.5 mg/dL (ref 8.6–10.4)
Chloride: 96 mmol/L — ABNORMAL LOW (ref 98–110)
Creat: 0.77 mg/dL (ref 0.60–0.95)
Globulin: 2.7 g/dL (calc) (ref 1.9–3.7)
Glucose, Bld: 93 mg/dL (ref 65–99)
Potassium: 4.8 mmol/L (ref 3.5–5.3)
Sodium: 134 mmol/L — ABNORMAL LOW (ref 135–146)
Total Bilirubin: 0.4 mg/dL (ref 0.2–1.2)
Total Protein: 7.4 g/dL (ref 6.1–8.1)
eGFR: 76 mL/min/{1.73_m2} (ref >=60)

## 2021-05-03 LAB — CBC WITH DIFFERENTIAL/PLATELET
Absolute Monocytes: 566 cells/uL (ref 200–950)
Basophils Absolute: 30 cells/uL (ref 0–200)
Basophils Relative: 0.5 %
Eosinophils Absolute: 41 cells/uL (ref 15–500)
Eosinophils Relative: 0.7 %
HCT: 38.7 % (ref 35.0–45.0)
Hemoglobin: 12.9 g/dL (ref 11.7–15.5)
Lymphs Abs: 1705 cells/uL (ref 850–3900)
MCH: 31.1 pg (ref 27.0–33.0)
MCHC: 33.3 g/dL (ref 32.0–36.0)
MCV: 93.3 fL (ref 80.0–100.0)
MPV: 11 fL (ref 7.5–12.5)
Monocytes Relative: 9.6 %
Neutro Abs: 3558 cells/uL (ref 1500–7800)
Neutrophils Relative %: 60.3 %
Platelets: 219 10*3/uL (ref 140–400)
RBC: 4.15 10*6/uL (ref 3.80–5.10)
RDW: 11.9 % (ref 11.0–15.0)
Total Lymphocyte: 28.9 %
WBC: 5.9 10*3/uL (ref 3.8–10.8)

## 2021-05-03 NOTE — Progress Notes (Signed)
============================================================ ============================================================  -   CBC - Kidneys - Electrolytes &  Liver   - all  Normal / OK  <><><><><><><><><><><><><><><><><><><><><><><><><><><><><><><><><> <><><><><><><><><><><><><><><><><><><><><><><><><><><><><><><><><> 

## 2021-05-11 NOTE — Progress Notes (Signed)
Patient is aware of lab results. -e welch

## 2021-05-30 ENCOUNTER — Ambulatory Visit: Payer: PPO | Admitting: Pharmacist

## 2021-06-21 NOTE — Progress Notes (Signed)
Complete Physical  Assessment:    Encounter for General Adult Medical Examination with Abnormal Findings Due annually   Tortuous aorta (HCC) Control blood pressure, cholesterol, glucose, increase exercise.   Renal infarct ([place]) Continue on elliquis  Atrial fibrillation with controlled ventricular response (HCC)/ thombophilia (HCC) Continue elliquis, she is rate controlled, continue follow up cardio No concerns with bleeding  Aortic atherosclerosis (HCC) Control blood pressure, cholesterol, glucose, increase exercise.   Chronic diastolic congestive heart failure (HCC) Monitor, no symptoms of fluid overload at this time, patient having weight loss, no swelling, no PND, orthopenia  Pulmonary emphysema, unspecified emphysema type ([place]) Per CXR; likely age related; denies sx; monitor  Mild malnutrition ([place]) Resume remeron PRN appetite if weights trending back down . Stressed importance of protein and high caloric foods -     COMPLETE METABOLIC PANEL WITH GFR  Essential hypertension -     CBC with Differential/Platelet -     COMPLETE METABOLIC PANEL WITH GFR -     TSH - Microalbumin/creatinine ratio - Urine routine with reflex microscopic - continue medications, DASH diet, exercise and monitor at home. Call if greater than 130/80.   Coronary atherosclerosis due to lipid rich plaque Control blood pressure, cholesterol, glucose, increase exercise.   Vitamin D deficiency Continue supplement  Medication management -     Magnesium  Mixed hyperlipidemia -     Lipid panel check lipids decrease fatty foods increase activity.    History of embolic stroke Continue eliquis Control blood pressure, cholesterol, glucose, increase exercise.   Chronic post-traumatic headache, not intractable Continue klonopin, try to limit use Gabapentin helpful  Atypical facial pain Gabapentin helpful   Hepatic steatosis Check labs, avoid tylenol, alcohol   Gastroesophageal reflux  disease, unspecified whether esophagitis present Continue PPI/H2 blocker, diet discussed  Osteoporosis, unspecified osteoporosis type, unspecified pathological fracture presence  DEXA UTD 03/2021, osteoporosis with forearm T score 3.7 Continue Vit D and high calcium diet, weight bearing exercises encouraged  Abnormal glucose/ Prediabetes Discussed disease and risks Discussed diet/exercise, weight management  A1C q80m, check CMP    Over 40 minutes of exam, counseling, chart review and critical decision making was performed Future Appointments  Date Time Provider [place]  07/18/2021  1:30 PM Newton Pigg, [facility] None  09/29/2021 10:00 AM Croitoru, Dani Gobble, MD [facility]  10/20/2021 11:00 AM Magda Bernheim, NP [facility] None  06/22/2022 10:00 AM Magda Bernheim, NP [facility] None       Subjective:  Debra White is a 87 y.o. female who presents for Medicare Annual Wellness Vist and 3 Month follow up.   Husband has dementia, she continues to take care of him at home - was in skilled facility for a month. She does have hospice that comes in to help  She reports GERD is well controlled by omeprazole 20 mg daily. Uses bentyl occasionally for cramping and diarrhea.   She has residual face pain related to fall and zygomatic fracture with hardware implanted, manages pain with gabapentin 600 mg TID.   BMI is Body mass index is 18.47 kg/m., she is working on diet, minimal intentional exercise but active as caregiver. Uses remeron intermittently to help with appetite and maintain weight.  Wt Readings from Last 3 Encounters:  06/22/21 107 lb 9.6 oz (48.8 kg)  05/02/21 106 lb 9.6 oz (48.4 kg)  03/14/21 109 lb 9.6 oz (49.7 kg)   She had splenic and renal infarcts in 2018, history of CVA and on Eliquis 2.5 mg  BID for Afib.  CAD, diastolic CHF, Echo 6503 EF 55-60% with severe LAE. Declines TEE and loop recorder, follows with cardiology Dr. Oval Linsey. She also has  aortic atherosclerosis per CT 05/2020.    Her blood pressure has not been checking BP at home, today their BP is BP: (!) 148/74 BP Readings from Last 3 Encounters:  06/22/21 (!) 148/74  05/02/21 131/63  03/14/21 (!) 184/80    She does not workout. She denies chest pain, shortness of breath, dizziness.   She is on cholesterol medication (lovastatin 20 mg daily) and denies myalgias at the current dose, had myalgias with higher doses. Her cholesterol is not at goal. The cholesterol last visit was:   Lab Results  Component Value Date   CHOL 168 06/22/2021   HDL 92 06/22/2021   LDLCALC 55 06/22/2021   TRIG 120 06/22/2021   CHOLHDL 1.8 06/22/2021   She has prediabetes.  has not been working on diet and exercise for prediabetes, and denies hyperglycemia, hypoglycemia , nausea, polydipsia, polyuria and visual disturbances. Last A1C in the office was:  Lab Results  Component Value Date   HGBA1C 5.8 (H) 06/22/2021   Last GFR: Lab Results  Component Value Date   GFRNONAA 65 10/20/2020   Patient is on Vitamin D supplement.   Lab Results  Component Value Date   VD25OH 93 12/15/2019    Medication Review:   Current Outpatient Medications (Cardiovascular):    amLODipine (NORVASC) 2.5 MG tablet, Take 1 tablet (2.5 mg total) by mouth daily.   atenolol (TENORMIN) 100 MG tablet, Take 1 tablet daily for blood pressure   lovastatin (MEVACOR) 20 MG tablet, Take 1 tablet at Bedtime for Cholesterol   olmesartan (BENICAR) 40 MG tablet, Take  1 tablet  Daily for BP   Current Outpatient Medications (Analgesics):    acetaminophen (TYLENOL) 500 MG tablet, Take 1,000 mg by mouth every 8 (eight) hours as needed for headache.   Current Outpatient Medications (Hematological):    apixaban (ELIQUIS) 2.5 MG TABS tablet, Take  1 Tablet  2 x /day (every 12 hours) to Prevent Blood Clots   Cyanocobalamin (VITAMIN B 12 PO), Take 1 tablet by mouth daily.  Current Outpatient Medications (Other):     clonazePAM (KLONOPIN) 1 MG tablet, TAKE 1/2 TO 1 TABLET  2-3 TIMES DAILY AS NEEDED FOR ANXIETY ATTACKS LIMIT TO 5 DAYS A WEEK TO AVOID ADDICTION AND DEMENTIA   conjugated estrogens (PREMARIN) vaginal cream, Place 1 Applicatorful vaginally See admin instructions. Apply one applicatorful vaginally once a week   diclofenac Sodium (VOLTAREN) 1 % GEL, APPLY 2 GRAMS TOPICALLY 3-4 TIMES DAILY AS NEEDED FOR PAIN   dicyclomine (BENTYL) 20 MG tablet, TAKE 1 TABLET BY MOUTH FOUR TIMES DAILY WITH MEALS AND BEDTIME FOR CRAMPING  NAUSEA  OR BLOATING (Patient taking differently: TAKE 1 TABLET BY MOUTH FOUR TIMES DAILY WITH MEALS AND BEDTIME FOR CRAMPING  NAUSEA  OR BLOATING)   gabapentin (NEURONTIN) 600 MG tablet, Take  1 tablet  3 x /day  for Facial Pain   omeprazole (PRILOSEC) 40 MG capsule, TAKE 1 CAPSULE BY MOUTH DAILY BEFORE BREAKFAST   polyvinyl alcohol (LIQUIFILM TEARS) 1.4 % ophthalmic solution, Place 1 drop into both eyes 2 (two) times daily as needed for dry eyes.   Vaginal Lubricant (REPLENS) GEL, Place vaginally daily.   Vitamin D-Vitamin K (VITAMIN K2-VITAMIN D3 PO), Take by mouth. 90 mcg of K2, 5,000 units of D3   Allergies  Allergen Reactions   Latex Hives  TONGUE HAD BLISTERS WHEN HAD DENTAL SURGERY   Amoxicillin Nausea And Vomiting   Aspirin Other (See Comments)    Gi upset   Barbiturates Other (See Comments)    Unknown reaction   Celebrex [Celecoxib] Other (See Comments)    Unknown reaction   Codeine Itching   Evista [Raloxifene] Other (See Comments)    Unknown reaction   Lipitor [Atorvastatin] Other (See Comments)    Unknown reaction   Morphine And Related Other (See Comments)    Unknown reaction   Oruvail [Ketoprofen] Other (See Comments)    Unknown reaction   Pantoprazole Diarrhea   Paraffin Other (See Comments)    Unknown reaction   Prochlorperazine Other (See Comments)    Uncontrolled shaking   Proloprim [Trimethoprim] Other (See Comments)    Unknown reaction    Venlafaxine Nausea Only   Vibra-Tab [Doxycycline] Other (See Comments)    Unknown reaction   Vioxx [Rofecoxib] Other (See Comments)    edema   Betadine [Povidone Iodine] Rash   Caffeine Palpitations    Current Problems (verified) Patient Active Problem List   Diagnosis Date Noted   Thrombophilia ([place]) 10/20/2020   Iron deficiency anemia 05/31/2020   Epigastric discomfort 11/19/2019   Anxiety 37/03/6268   Diastolic CHF ([place]) 48/54/6270   History of embolic stroke 35/00/9381   Atrial fibrillation with controlled ventricular response ([place]) 03/12/2019   Coronary atherosclerosis due to lipid rich plaque 03/26/2018   Renal infarct ([place]) 03/22/2018   Hyponatremia 03/22/2018   Chronic hyperkalemia 03/22/2018   Hepatic steatosis 11/17/2017   Aortic atherosclerosis ([place]) by Abd CT scan (05/2020)  08/08/2017   COPD (chronic obstructive pulmonary disease) with emphysema ([place]) 12/21/2015   Tortuous aorta ([place]) 12/21/2015   Osteoporosis 12/21/2015   Mild malnutrition ([place]) 05/11/2015   Encounter for Medicare annual wellness exam 04/08/2015   Medication management 01/27/2014   Atypical facial pain 10/08/2013   Vitamin D deficiency 07/08/2013   Hypertension    Hyperlipidemia    GERD    Headache, post-traumatic, chronic 09/20/2011    Screening Tests Immunization History  Administered Date(s) Administered   Influenza, High Dose Seasonal PF 03/25/2014, 04/05/2016, 06/02/2018, 03/12/2019, 03/23/2020, 05/02/2021   Influenza-Unspecified 03/09/2013, 03/11/2015   PFIZER(Purple Top)SARS-COV-2 Vaccination 06/01/2020, 06/22/2020   Pneumococcal Conjugate-13 12/21/2015   Pneumococcal Polysaccharide-23 06/25/2002   Td 06/26/2003   Preventative care: Last colonoscopy: 2013 EGD 2013  Last mammogram: 09/2020 DEXA: 04/11/21 osteoporosis  Prior vaccinations: TD or Tdap: 2005, declines, will get PRN Influenza:05/02/21  Pneumococcal: 2004 Prevnar13: 2017 Shingles/Zostavax: N/A Covid 19: 2/2,  2021  Names of Other Physician/Practitioners you currently use: 1. [facility] here for primary care 2.  Eye Exam, Dr. Sabra Heck 2022 3. Dentist 2022  Patient Care Team: Unk Pinto, MD as PCP - General (Internal Medicine) Sanda Klein, MD as PCP - Cardiology (Cardiology) Darleen Crocker, MD as Consulting Physician (Ophthalmology) Ladene Artist, MD as Consulting Physician (Gastroenterology) Cameron Sprang, MD as Consulting Physician (Neurology) Jodi Marble, MD as Consulting Physician (Otolaryngology) Rush Landmark, [facility] as Pharmacist (Pharmacist)  SURGICAL HISTORY She  has a past surgical history that includes Abdominal hysterectomy; Hand surgery; Shoulder arthroscopy w/ rotator cuff repair; Cervical disc surgery; Back surgery; ORIF tripod fracture (07/13/2011); Colonoscopy; Facial fracture surgery; Back surgery; Ptosis repair (Bilateral, 11/22/2016); and Breast excisional biopsy. FAMILY HISTORY Her family history includes Breast cancer (age of onset: 61) in her sister; Cancer in her sister, sister, and sister; Colon cancer in her sister; Heart attack in her  father; Heart disease in her father, mother, and sister; Hyperlipidemia in her daughter; Hypertension in her daughter. SOCIAL HISTORY She  reports that she has never smoked. She has never used smokeless tobacco. She reports that she does not drink alcohol and does not use drugs.    Review of Systems  Constitutional:  Negative for malaise/fatigue and weight loss.  HENT:  Negative for hearing loss and tinnitus.   Eyes:  Negative for blurred vision and double vision.  Respiratory:  Negative for cough, sputum production, shortness of breath and wheezing.        Chronic facial pain, well controlled  Cardiovascular:  Negative for chest pain, palpitations, orthopnea, claudication, leg swelling and PND.  Gastrointestinal:  Negative for abdominal pain, blood in stool, constipation, diarrhea  (intermittent), heartburn, melena, nausea and vomiting.  Genitourinary: Negative.   Musculoskeletal:  Negative for falls, joint pain and myalgias.  Skin:  Negative for rash.  Neurological:  Negative for dizziness, tingling, sensory change, weakness and headaches.  Endo/Heme/Allergies:  Negative for polydipsia.  Psychiatric/Behavioral: Negative.  Negative for depression, memory loss, substance abuse and suicidal ideas. The patient is not nervous/anxious and does not have insomnia.   All other systems reviewed and are negative.   Objective:     Today's Vitals   06/22/21 1024  BP: (!) 148/74  Pulse: 60  Temp: 97.9 F (36.6 C)  SpO2: 97%  Weight: 107 lb 9.6 oz (48.8 kg)  Height: 5\' 4"  (1.626 m)   Body mass index is 18.47 kg/m.  General appearance: alert, no distress, WD/WN, thin elderly female HEENT: normocephalic, sclerae anicteric, TMs pearly right, left cerumen impaction, nares patent, no discharge or erythema, pharynx normal. Mildly HOH, hearing aids.  Oral cavity: MMM, no lesions Neck: supple, no lymphadenopathy, no thyromegaly, no masses Heart: RRR, normal S1, S2, no murmurs Lungs: CTA bilaterally, no wheezes, rhonchi, or rales Abdomen: +bs, soft, diffuse tenderness, non distended, no masses, no hepatomegaly, no splenomegaly Musculoskeletal: nontender, no swelling, no obvious deformity Extremities: no edema, no cyanosis, no clubbing Pulses: 2+ symmetric, upper and lower extremities, normal cap refill Neurological: alert, oriented x 3, CN2-12 intact, strength normal upper extremities and lower extremities, sensation normal throughout, DTRs 2+ throughout, no cerebellar signs, gait normal  Psychiatric: normal affect, behavior normal, pleasant  EKG: Sinus bradycardia, no ST changes    Magda Bernheim, NP   06/16/2019

## 2021-06-22 ENCOUNTER — Other Ambulatory Visit: Payer: Self-pay | Admitting: Internal Medicine

## 2021-06-22 ENCOUNTER — Ambulatory Visit (INDEPENDENT_AMBULATORY_CARE_PROVIDER_SITE_OTHER): Payer: PPO | Admitting: Nurse Practitioner

## 2021-06-22 ENCOUNTER — Encounter: Payer: Self-pay | Admitting: Nurse Practitioner

## 2021-06-22 ENCOUNTER — Other Ambulatory Visit: Payer: Self-pay

## 2021-06-22 VITALS — BP 148/74 | HR 60 | Temp 97.9°F | Ht 64.0 in | Wt 107.6 lb

## 2021-06-22 DIAGNOSIS — Z8673 Personal history of transient ischemic attack (TIA), and cerebral infarction without residual deficits: Secondary | ICD-10-CM

## 2021-06-22 DIAGNOSIS — E782 Mixed hyperlipidemia: Secondary | ICD-10-CM | POA: Diagnosis not present

## 2021-06-22 DIAGNOSIS — K219 Gastro-esophageal reflux disease without esophagitis: Secondary | ICD-10-CM

## 2021-06-22 DIAGNOSIS — J439 Emphysema, unspecified: Secondary | ICD-10-CM

## 2021-06-22 DIAGNOSIS — I5032 Chronic diastolic (congestive) heart failure: Secondary | ICD-10-CM | POA: Diagnosis not present

## 2021-06-22 DIAGNOSIS — I10 Essential (primary) hypertension: Secondary | ICD-10-CM

## 2021-06-22 DIAGNOSIS — Z0000 Encounter for general adult medical examination without abnormal findings: Secondary | ICD-10-CM | POA: Diagnosis not present

## 2021-06-22 DIAGNOSIS — E441 Mild protein-calorie malnutrition: Secondary | ICD-10-CM

## 2021-06-22 DIAGNOSIS — R7309 Other abnormal glucose: Secondary | ICD-10-CM | POA: Diagnosis not present

## 2021-06-22 DIAGNOSIS — I771 Stricture of artery: Secondary | ICD-10-CM | POA: Diagnosis not present

## 2021-06-22 DIAGNOSIS — F419 Anxiety disorder, unspecified: Secondary | ICD-10-CM | POA: Diagnosis not present

## 2021-06-22 DIAGNOSIS — I4891 Unspecified atrial fibrillation: Secondary | ICD-10-CM

## 2021-06-22 DIAGNOSIS — I700 Atherosclerosis of aorta: Secondary | ICD-10-CM

## 2021-06-22 DIAGNOSIS — K760 Fatty (change of) liver, not elsewhere classified: Secondary | ICD-10-CM

## 2021-06-22 DIAGNOSIS — G501 Atypical facial pain: Secondary | ICD-10-CM

## 2021-06-22 DIAGNOSIS — Z0001 Encounter for general adult medical examination with abnormal findings: Secondary | ICD-10-CM

## 2021-06-22 DIAGNOSIS — N280 Ischemia and infarction of kidney: Secondary | ICD-10-CM

## 2021-06-22 DIAGNOSIS — M810 Age-related osteoporosis without current pathological fracture: Secondary | ICD-10-CM

## 2021-06-22 DIAGNOSIS — Z136 Encounter for screening for cardiovascular disorders: Secondary | ICD-10-CM

## 2021-06-22 DIAGNOSIS — Z79899 Other long term (current) drug therapy: Secondary | ICD-10-CM

## 2021-06-22 DIAGNOSIS — G44329 Chronic post-traumatic headache, not intractable: Secondary | ICD-10-CM

## 2021-06-22 DIAGNOSIS — E559 Vitamin D deficiency, unspecified: Secondary | ICD-10-CM

## 2021-06-23 ENCOUNTER — Encounter: Payer: Self-pay | Admitting: Nurse Practitioner

## 2021-06-23 LAB — CBC WITH DIFFERENTIAL/PLATELET
Absolute Monocytes: 536 cells/uL (ref 200–950)
Basophils Absolute: 29 cells/uL (ref 0–200)
Basophils Relative: 0.5 %
Eosinophils Absolute: 57 cells/uL (ref 15–500)
Eosinophils Relative: 1 %
HCT: 36.7 % (ref 35.0–45.0)
Hemoglobin: 12.1 g/dL (ref 11.7–15.5)
Lymphs Abs: 1408 cells/uL (ref 850–3900)
MCH: 30.6 pg (ref 27.0–33.0)
MCHC: 33 g/dL (ref 32.0–36.0)
MCV: 92.7 fL (ref 80.0–100.0)
MPV: 10.8 fL (ref 7.5–12.5)
Monocytes Relative: 9.4 %
Neutro Abs: 3671 cells/uL (ref 1500–7800)
Neutrophils Relative %: 64.4 %
Platelets: 194 10*3/uL (ref 140–400)
RBC: 3.96 10*6/uL (ref 3.80–5.10)
RDW: 12.5 % (ref 11.0–15.0)
Total Lymphocyte: 24.7 %
WBC: 5.7 10*3/uL (ref 3.8–10.8)

## 2021-06-23 LAB — COMPLETE METABOLIC PANEL WITH GFR
AG Ratio: 2 (calc) (ref 1.0–2.5)
ALT: 15 U/L (ref 6–29)
AST: 21 U/L (ref 10–35)
Albumin: 4.7 g/dL (ref 3.6–5.1)
Alkaline phosphatase (APISO): 92 U/L (ref 37–153)
BUN: 14 mg/dL (ref 7–25)
CO2: 32 mmol/L (ref 20–32)
Calcium: 9.7 mg/dL (ref 8.6–10.4)
Chloride: 99 mmol/L (ref 98–110)
Creat: 0.77 mg/dL (ref 0.60–0.95)
Globulin: 2.4 g/dL (calc) (ref 1.9–3.7)
Glucose, Bld: 93 mg/dL (ref 65–99)
Potassium: 4.1 mmol/L (ref 3.5–5.3)
Sodium: 139 mmol/L (ref 135–146)
Total Bilirubin: 0.4 mg/dL (ref 0.2–1.2)
Total Protein: 7.1 g/dL (ref 6.1–8.1)
eGFR: 76 mL/min/{1.73_m2} (ref >=60)

## 2021-06-23 LAB — LIPID PANEL
Cholesterol: 168 mg/dL (ref <200)
HDL: 92 mg/dL (ref >=50)
LDL Cholesterol (Calc): 55 mg/dL (calc)
Non-HDL Cholesterol (Calc): 76 mg/dL (calc) (ref <130)
Total CHOL/HDL Ratio: 1.8 (calc) (ref <5.0)
Triglycerides: 120 mg/dL (ref <150)

## 2021-06-23 LAB — URINALYSIS, ROUTINE W REFLEX MICROSCOPIC
Bilirubin Urine: NEGATIVE
Glucose, UA: NEGATIVE
Hgb urine dipstick: NEGATIVE
Ketones, ur: NEGATIVE
Leukocytes,Ua: NEGATIVE
Nitrite: NEGATIVE
Protein, ur: NEGATIVE
Specific Gravity, Urine: 1.006 (ref 1.001–1.035)
pH: 7 (ref 5.0–8.0)

## 2021-06-23 LAB — MICROALBUMIN / CREATININE URINE RATIO
Creatinine, Urine: 16 mg/dL — ABNORMAL LOW (ref 20–275)
Microalb Creat Ratio: 19 mcg/mg creat (ref <30)
Microalb, Ur: 0.3 mg/dL

## 2021-06-23 LAB — TSH: TSH: 0.84 mIU/L (ref 0.40–4.50)

## 2021-06-23 LAB — HEMOGLOBIN A1C
Hgb A1c MFr Bld: 5.8 % of total Hgb — ABNORMAL HIGH (ref <5.7)
Mean Plasma Glucose: 120 mg/dL
eAG (mmol/L): 6.6 mmol/L

## 2021-06-23 LAB — MAGNESIUM: Magnesium: 1.9 mg/dL (ref 1.5–2.5)

## 2021-06-27 ENCOUNTER — Other Ambulatory Visit: Payer: Self-pay | Admitting: Adult Health

## 2021-06-27 DIAGNOSIS — K589 Irritable bowel syndrome without diarrhea: Secondary | ICD-10-CM

## 2021-07-10 ENCOUNTER — Other Ambulatory Visit: Payer: Self-pay | Admitting: Internal Medicine

## 2021-07-17 ENCOUNTER — Telehealth: Payer: Self-pay

## 2021-07-17 NOTE — Telephone Encounter (Signed)
Called patient to confirm upcoming visit with CPP. Unable to reach patient. Tried to LVM but mail box was full.    Vanetta Shawl, [facility]

## 2021-07-18 ENCOUNTER — Ambulatory Visit: Payer: PPO | Admitting: Pharmacist

## 2021-07-25 ENCOUNTER — Other Ambulatory Visit: Payer: Self-pay | Admitting: Adult Health Nurse Practitioner

## 2021-07-25 DIAGNOSIS — I700 Atherosclerosis of aorta: Secondary | ICD-10-CM

## 2021-07-25 DIAGNOSIS — N280 Ischemia and infarction of kidney: Secondary | ICD-10-CM

## 2021-07-25 DIAGNOSIS — E782 Mixed hyperlipidemia: Secondary | ICD-10-CM

## 2021-07-26 ENCOUNTER — Encounter: Payer: Self-pay | Admitting: Adult Health

## 2021-07-26 ENCOUNTER — Ambulatory Visit (INDEPENDENT_AMBULATORY_CARE_PROVIDER_SITE_OTHER): Payer: PPO | Admitting: Adult Health

## 2021-07-26 ENCOUNTER — Other Ambulatory Visit: Payer: Self-pay

## 2021-07-26 VITALS — BP 118/66 | HR 54 | Temp 97.3°F | Wt 105.0 lb

## 2021-07-26 DIAGNOSIS — R55 Syncope and collapse: Secondary | ICD-10-CM

## 2021-07-26 NOTE — Patient Instructions (Addendum)
It looks like most likely your blood pressure got too low this morning and caused you to pass out.   I am checking labs for dehydration -   I would like for you to drink a glass of water or tea every 2-3 hours  Please check your blood pressure every morning before taking your medications - if your number is less than 120/70, please do not take your amlodipine and call the office  If you pass out again please check BP and pulse and get checked out in the ED  Call the office if you have any unusual symptoms    Syncope, Adult Syncope is when you pass out or faint for a short time. It is caused by a sudden decrease in blood flow to the brain. This can happen for many reasons. It can sometimes happen when seeing blood, getting a shot (injection), or having pain or strong emotions. Most causes of fainting are not dangerous, but in some cases it can be a sign of a serious medical problem. If you faint, get help right away. Call your local emergency services (911 in the [place] Follow these instructions at home: Watch for any changes in your symptoms. Take these actions to stay safe and help with your symptoms: Knowing when you may be about to faint Signs that you may be about to faint include: Feeling dizzy or light-headed. It may feel like the room is spinning. Feeling weak. Feeling like you may vomit (nauseous). Seeing spots or seeing all white or all black. Having cold, clammy skin. Feeling warm and sweaty. Hearing ringing in the ears. If you start to feel like you might faint, sit or lie down right away. If sitting, lower your head down between your legs. If lying down, raise (elevate) your feet above the level of your heart. Breathe deeply and steadily. Wait until all of the symptoms are gone. Have someone stay with you until you feel better. Medicines Take over-the-counter and prescription medicines only as told by your doctor. If you are taking blood pressure or heart medicine, sit up  and stand up slowly. Spend a few minutes getting ready to sit and then stand. This can help you feel less dizzy. Lifestyle Do not drive, use machinery, or play sports until your doctor says it is okay. Do not drink alcohol. Do not smoke or use any products that contain nicotine or tobacco. If you need help quitting, ask your doctor. Avoid hot tubs and saunas. General instructions Talk with your doctor about your symptoms. You may need to have testing to help find the cause. Drink enough fluid to keep your pee (urine) pale yellow. Avoid standing for a long time. If you must stand for a long time, do movements such as: Moving your legs. Crossing your legs. Flexing and stretching your leg muscles. Squatting. Keep all follow-up visits. Contact a doctor if: You have episodes of near fainting. Get help right away if: You pass out or faint. You hit your head or are injured after fainting. You have any of these symptoms: Fast or uneven heartbeats (palpitations). Pain in your chest, belly, or back. Shortness of breath. You have jerky movements that you cannot control (seizure). You have a very bad headache. You are confused. You have problems with how you see (vision). You are very weak. You have trouble walking. You are bleeding from your mouth or your butt (rectum). You have black or tarry poop (stool). These symptoms may be an emergency. Get help right  away. Call your local emergency services (911 in the [place] Do not wait to see if the symptoms will go away. Do not drive yourself to the hospital. Summary Syncope is when you pass out or faint for a short time. It is caused by a sudden decrease in blood flow to the brain. Signs that you may be about to faint include feeling dizzy or light-headed, feeling like you may vomit, seeing all white or all black, or having cold, clammy skin. If you start to feel like you might faint, sit or lie down right away. Lower your head if sitting, or  raise (elevate) your feet if lying down. Breathe deeply and steadily. Wait until all of the symptoms are gone. This information is not intended to replace advice given to you by your health care provider. Make sure you discuss any questions you have with your health care provider. Document Revised: 10/20/2020 Document Reviewed: 10/20/2020 Elsevier Patient Education  2022 [employer].

## 2021-07-26 NOTE — Progress Notes (Signed)
ACUTE VISIT  Assessment:    Diagnoses and all orders for this visit:  Syncope, unspecified syncope type Unwitnessed syncopal episode this AM in 88 y/o with complex medical hx. Debra White is somwhat poor historian. Debra White adds to history. Some evidence of low BP, possible dehydration, poor fluid/oral intake and hasn't been checking BPs No specific injury noted on exam, no new pain concerns Neuro is normal, declined ED evaluation  Pending labs push fluids, intake q2-3 hours, check BPs in AM prior to taking meds, hold and call office if <120/70 will have her hold amlodipine if needed R/o UTI If any further syncope EMS to ED recommended If HA, neuro changes, new concerning sx contact office -     CBC with Differential/Platelet -     BASIC METABOLIC PANEL WITH GFR -     Urinalysis, Routine w reflex microscopic -     Orthostatic vital signs  Over 30 minutes of exam, counseling, chart review and critical decision making was performed Future Appointments  Date Time Provider [place]  08/29/2021 10:00 AM Newton Pigg, RPH [facility] None  09/29/2021 10:00 AM Croitoru, Dani Gobble, MD [facility]  10/20/2021 11:00 AM Magda Bernheim, NP [facility] None  06/22/2022 10:00 AM Magda Bernheim, NP [facility] None    Subjective:  Debra White is a 88 y.o. female with hx of htn, CAD, a. Fib, CHF, hx of CVA and chronic pain presents accompanied by her Debra White for evaluation of syncope this AM.   Debra White is somewhat a poor historian. Reports her husband is on hospice, family assists and has hospice help that come in but Debra White is primary caregiver. Per Debra White Debra White has not been taking as good care of herself, stopped checking BPs, fluid intake isn't good. Debra White also has mild malnutrition. Per patient this AM as Debra White was rounding around her husband's hospice bed Debra White suddenly lost consciousness, came to on the ground leaning against the closet door. Debra White denies recalling any prodrome symptoms, believes Debra White woke up quickly  but cannot be sure. No recollection of CP, dizziness, palpitations. Debra White notes has vaguely been fatigued lately but cannot clarify duration. Debra White denies headache, does feel neck muscles are stiff, denies specific joint pain. Debra White/Debra White deny confusion. Debra White does note has some nausea/diarrhea episodes (IBS, uses bentyl intermittently) yesterday, none today.   Debra White has residual face pain related to fall and zygomatic fracture with hardware implanted, manages pain with gabapentin 600 mg TID. Today Debra White is complaining of her typical chronic pain, doesn't appear to have new areas of pain.   BMI is Body mass index is 18.02 kg/m., Debra White is working on diet, minimal intentional exercise but active as caregiver. Uses remeron intermittently to help with appetite and maintain weight.  Wt Readings from Last 3 Encounters:  07/26/21 105 lb (47.6 kg)  06/22/21 107 lb 9.6 oz (48.8 kg)  05/02/21 106 lb 9.6 oz (48.4 kg)   Debra White had splenic and renal infarcts in 2018, history of CVA and on Eliquis 2.5 mg BID for Afib.  CAD, diastolic CHF, Echo 4431 EF 55-60% with severe LAE. Declined TEE and loop recorder, follows with cardiology Dr. Oval Linsey. Debra White also has aortic atherosclerosis per CT 05/2020.    Her blood pressure is historically labile, has not been checking BP at home, currently taking atenolol 100 mg, olmesartan 40 mg, amlodipine 2.5 mg daily in AM. Today their BP is BP: 118/66 BP Readings from Last 3 Encounters:  07/26/21 118/66  06/22/21 (!) 148/74  05/02/21 131/63  Debra White does not workout. Debra White denies chest pain, shortness of breath, dizziness.   Orthostatics BP drops with sitting to standing, no pulse respose but note Debra White is on atenolol 100 mg due to A. Fib.    Medication Review:   Current Outpatient Medications (Cardiovascular):    amLODipine (NORVASC) 2.5 MG tablet, Take 1 tablet (2.5 mg total) by mouth daily.   atenolol (TENORMIN) 100 MG tablet, Take 1 tablet daily for blood pressure   lovastatin (MEVACOR) 20  MG tablet, TAKE 1 TABLET BY MOUTH EACH NIGHT AT BEDTIME FOR CHOLESTEROL   olmesartan (BENICAR) 40 MG tablet, Take  1 tablet  Daily for BP   Current Outpatient Medications (Analgesics):    acetaminophen (TYLENOL) 500 MG tablet, Take 1,000 mg by mouth every 8 (eight) hours as needed for headache.   Current Outpatient Medications (Hematological):    apixaban (ELIQUIS) 2.5 MG TABS tablet, Take  1 Tablet  2 x /day (every 12 hours) to Prevent Blood Clots   Cyanocobalamin (VITAMIN B 12 PO), Take 1 tablet by mouth daily.  Current Outpatient Medications (Other):    clonazePAM (KLONOPIN) 0.5 MG tablet, Take 1/2 - 1 tablet 2 - 3 x /day ONLY if needed for  SEVERE  Anxiety Attack &  limit to 5 days /week to avoid Addiction & Dementia - ONLY to be used very Sparingly   conjugated estrogens (PREMARIN) vaginal cream, Place 1 Applicatorful vaginally See admin instructions. Apply one applicatorful vaginally once a week   diclofenac Sodium (VOLTAREN) 1 % GEL, APPLY 2 GRAMS TOPICALLY 3-4 TIMES DAILY AS NEEDED FOR PAIN   dicyclomine (BENTYL) 20 MG tablet, TAKE 1 TABLET BY MOUTH FOUR TIMES DAILY WITH MEALS AND BEDTIME FOR CRAMPING  NAUSEA  OR BLOATING   gabapentin (NEURONTIN) 600 MG tablet, Take  1 tablet  3 x /day  for Facial Pain   omeprazole (PRILOSEC) 40 MG capsule, TAKE 1 CAPSULE BY MOUTH DAILY BEFORE BREAKFAST   polyvinyl alcohol (LIQUIFILM TEARS) 1.4 % ophthalmic solution, Place 1 drop into both eyes 2 (two) times daily as needed for dry eyes.   Vaginal Lubricant (REPLENS) GEL, Place vaginally daily.   Vitamin D-Vitamin K (VITAMIN K2-VITAMIN D3 PO), Take by mouth. 90 mcg of K2, 5,000 units of D3   Allergies  Allergen Reactions   Latex Hives    TONGUE HAD BLISTERS WHEN HAD DENTAL SURGERY   Amoxicillin Nausea And Vomiting   Aspirin Other (See Comments)    Gi upset   Barbiturates Other (See Comments)    Unknown reaction   Celebrex [Celecoxib] Other (See Comments)    Unknown reaction   Codeine Itching    Evista [Raloxifene] Other (See Comments)    Unknown reaction   Lipitor [Atorvastatin] Other (See Comments)    Unknown reaction   Morphine And Related Other (See Comments)    Unknown reaction   Oruvail [Ketoprofen] Other (See Comments)    Unknown reaction   Pantoprazole Diarrhea   Paraffin Other (See Comments)    Unknown reaction   Prochlorperazine Other (See Comments)    Uncontrolled shaking   Proloprim [Trimethoprim] Other (See Comments)    Unknown reaction   Venlafaxine Nausea Only   Vibra-Tab [Doxycycline] Other (See Comments)    Unknown reaction   Vioxx [Rofecoxib] Other (See Comments)    edema   Betadine [Povidone Iodine] Rash   Caffeine Palpitations    Current Problems (verified) Patient Active Problem List   Diagnosis Date Noted   Thrombophilia ([place]) 10/20/2020   Iron  deficiency anemia 05/31/2020   Epigastric discomfort 11/19/2019   Anxiety 74/94/4967   Diastolic CHF ([place]) 59/16/3846   History of embolic stroke 65/99/3570   Atrial fibrillation with controlled ventricular response ([place]) 03/12/2019   Coronary atherosclerosis due to lipid rich plaque 03/26/2018   Renal infarct ([place]) 03/22/2018   Hyponatremia 03/22/2018   Chronic hyperkalemia 03/22/2018   Hepatic steatosis 11/17/2017   Aortic atherosclerosis ([place]) by Abd CT scan (05/2020)  08/08/2017   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   Osteoporosis 12/21/2015   Mild malnutrition ([place]) 05/11/2015   Encounter for Medicare annual wellness exam 04/08/2015   Medication management 01/27/2014   Atypical facial pain 10/08/2013   Vitamin D deficiency 07/08/2013   Hypertension    Hyperlipidemia    GERD    Headache, post-traumatic, chronic 09/20/2011     SURGICAL HISTORY Debra White  has a past surgical history that includes Abdominal hysterectomy; Hand surgery; Shoulder arthroscopy w/ rotator cuff repair; Cervical disc surgery; Back surgery;  ORIF tripod fracture (07/13/2011); Colonoscopy; Facial fracture surgery; Back surgery; Ptosis repair (Bilateral, 11/22/2016); and Breast excisional biopsy. FAMILY HISTORY Her family history includes Breast cancer (age of onset: 19) in her sister; Cancer in her sister, sister, and sister; Colon cancer in her sister; Heart attack in her father; Heart disease in her father, mother, and sister; Hyperlipidemia in her daughter; Hypertension in her daughter. SOCIAL HISTORY Debra White  reports that Debra White has never smoked. Debra White has never used smokeless tobacco. Debra White reports that Debra White does not drink alcohol and does not use drugs.   Review of Systems  Constitutional:  Negative for chills, fever, malaise/fatigue and weight loss.  HENT:  Negative for congestion, ear discharge, ear pain, hearing loss, sinus pain, sore throat and tinnitus.   Eyes:  Negative for blurred vision and double vision.  Respiratory:  Negative for cough, sputum production, shortness of breath and wheezing.        Chronic facial pain, well controlled  Cardiovascular:  Negative for chest pain, palpitations, orthopnea, claudication, leg swelling and PND.  Gastrointestinal:  Positive for diarrhea (intermittent, several episodes yesterday, none today, chronic). Negative for abdominal pain, blood in stool, constipation, heartburn, melena, nausea and vomiting.  Genitourinary: Negative.   Musculoskeletal:  Positive for falls. Negative for joint pain and myalgias.       Chronic facial pain  Skin:  Negative for rash.  Neurological:  Positive for loss of consciousness. Negative for dizziness, tingling, sensory change, speech change, focal weakness, seizures, weakness and headaches.  Endo/Heme/Allergies:  Negative for polydipsia.  Psychiatric/Behavioral: Negative.  Negative for depression, memory loss, substance abuse and suicidal ideas. The patient is not nervous/anxious and does not have insomnia.   All other systems reviewed and are  negative.   Objective:     Today's Vitals   07/26/21 1122  BP: 118/66  Pulse: (!) 54  Temp: (!) 97.3 F (36.3 C)  SpO2: 99%  Weight: 105 lb (47.6 kg)   Body mass index is 18.02 kg/m.  General appearance: alert, no distress, WD/WN, thin elderly female HEENT: normocephalic, sclerae anicteric, TMs pearly right, left cerumen impaction, nares patent, no discharge or erythema, pharynx normal. Mildly HOH, hearing aids.  Oral cavity: MMM, no lesions Neck: supple, no lymphadenopathy, no thyromegaly, no masses Heart: RRR, normal S1, S2, no murmurs Lungs: CTA bilaterally, no wheezes, rhonchi, or rales Abdomen: +bs, soft, non-tender, non distended, no masses, no hepatomegaly, no splenomegaly Musculoskeletal: nontender throughout, non-displaced, no obvious  deformity, no swelling, no obvious deformity Extremities: no edema, no cyanosis, no clubbing Pulses: 2+ symmetric, upper and lower extremities, normal cap refill Neurological: alert, oriented x 3, CN2-12 intact, strength normal upper extremities and lower extremities, sensation normal throughout, DTRs 2+ throughout, no cerebellar signs, gait slow steady. Normal nose-finger-nose, rapid alternating, negative pronator drift.  Psychiatric: normal affect, behavior normal, pleasant   Izora Ribas, NP   06/16/2019

## 2021-07-27 LAB — CBC WITH DIFFERENTIAL/PLATELET
Absolute Monocytes: 583 cells/uL (ref 200–950)
Basophils Absolute: 32 cells/uL (ref 0–200)
Basophils Relative: 0.4 %
Eosinophils Absolute: 49 cells/uL (ref 15–500)
Eosinophils Relative: 0.6 %
HCT: 37.3 % (ref 35.0–45.0)
Hemoglobin: 12.5 g/dL (ref 11.7–15.5)
Lymphs Abs: 1531 cells/uL (ref 850–3900)
MCH: 30.9 pg (ref 27.0–33.0)
MCHC: 33.5 g/dL (ref 32.0–36.0)
MCV: 92.3 fL (ref 80.0–100.0)
MPV: 11.2 fL (ref 7.5–12.5)
Monocytes Relative: 7.2 %
Neutro Abs: 5905 cells/uL (ref 1500–7800)
Neutrophils Relative %: 72.9 %
Platelets: 199 10*3/uL (ref 140–400)
RBC: 4.04 10*6/uL (ref 3.80–5.10)
RDW: 12.7 % (ref 11.0–15.0)
Total Lymphocyte: 18.9 %
WBC: 8.1 10*3/uL (ref 3.8–10.8)

## 2021-07-27 LAB — URINALYSIS, ROUTINE W REFLEX MICROSCOPIC
Bacteria, UA: NONE SEEN /HPF
Bilirubin Urine: NEGATIVE
Glucose, UA: NEGATIVE
Hgb urine dipstick: NEGATIVE
Hyaline Cast: NONE SEEN /LPF
Ketones, ur: NEGATIVE
Nitrite: NEGATIVE
Protein, ur: NEGATIVE
RBC / HPF: NONE SEEN /HPF (ref 0–2)
Specific Gravity, Urine: 1.017 (ref 1.001–1.035)
WBC, UA: NONE SEEN /HPF (ref 0–5)
pH: 6 (ref 5.0–8.0)

## 2021-07-27 LAB — BASIC METABOLIC PANEL WITH GFR
BUN: 15 mg/dL (ref 7–25)
CO2: 31 mmol/L (ref 20–32)
Calcium: 9.1 mg/dL (ref 8.6–10.4)
Chloride: 98 mmol/L (ref 98–110)
Creat: 0.84 mg/dL (ref 0.60–0.95)
Glucose, Bld: 105 mg/dL — ABNORMAL HIGH (ref 65–99)
Potassium: 4.3 mmol/L (ref 3.5–5.3)
Sodium: 136 mmol/L (ref 135–146)
eGFR: 68 mL/min/{1.73_m2} (ref >=60)

## 2021-07-27 LAB — MICROSCOPIC MESSAGE

## 2021-08-11 ENCOUNTER — Other Ambulatory Visit: Payer: Self-pay

## 2021-08-11 DIAGNOSIS — K2100 Gastro-esophageal reflux disease with esophagitis, without bleeding: Secondary | ICD-10-CM

## 2021-08-11 MED ORDER — OMEPRAZOLE 40 MG PO CPDR: DELAYED_RELEASE_CAPSULE | ORAL | 1 refills | Status: DC

## 2021-08-24 ENCOUNTER — Other Ambulatory Visit: Payer: Self-pay | Admitting: Internal Medicine

## 2021-08-24 DIAGNOSIS — Z1231 Encounter for screening mammogram for malignant neoplasm of breast: Secondary | ICD-10-CM

## 2021-08-25 ENCOUNTER — Telehealth: Payer: Self-pay

## 2021-08-25 NOTE — Telephone Encounter (Signed)
08/25/21-Called patient, spoke to her sister "Olegario Shearer" and confirmed upcoming visit w/ cpp. ? ?Total time spent: 7 Minutes ?Vanetta Shawl, [facility] ?

## 2021-08-29 ENCOUNTER — Encounter: Payer: Self-pay | Admitting: Pharmacist

## 2021-08-29 ENCOUNTER — Ambulatory Visit: Payer: PPO | Admitting: Pharmacist

## 2021-08-29 ENCOUNTER — Other Ambulatory Visit: Payer: Self-pay

## 2021-08-29 DIAGNOSIS — E441 Mild protein-calorie malnutrition: Secondary | ICD-10-CM

## 2021-08-29 DIAGNOSIS — I10 Essential (primary) hypertension: Secondary | ICD-10-CM

## 2021-08-29 DIAGNOSIS — E782 Mixed hyperlipidemia: Secondary | ICD-10-CM

## 2021-08-29 DIAGNOSIS — F419 Anxiety disorder, unspecified: Secondary | ICD-10-CM

## 2021-08-29 DIAGNOSIS — Z79899 Other long term (current) drug therapy: Secondary | ICD-10-CM

## 2021-08-30 NOTE — Telephone Encounter (Signed)
08/30/21-Called patient for PAP for Eliquis. Gave pt. Phone  number [phone] to get a $10 copay card for a discount on her prescription. Pt.agreed to call and if she doesn't qualify for the discount, I encouraged patient to call me if she has any trouble, and I will look into other options. Pt. Verbalized understanding and agreed. ? ?Total time spent: 16 Minutes ?Vanetta Shawl, [facility] ?

## 2021-09-08 NOTE — Progress Notes (Signed)
Initial Pharmacist Visit (v 2.7.4) ? ?Debra White [id number] ?89 years, Female  DOB: 1933/09/27  M: [phone] ?Care Team: Ander Gaster, [place]  ?__________________________________________________ ?Clinical Summary ?Patient Risk: Moderate ?Next CCM Follow Up: 02/06/22 ?Next AWV: 06/22/22 ?Next PCP Visit: 10/20/21 ?Summary for PCP:  ?1. Medication Management- Patients current medications are appropriate and tolerated well. Patient does have issues paying for Eliquis $45 copay per month and $129 per month in donut hole. Will have team look into copay cards and PAP for patient. ? ?2. HTN- Patient goal <130/80. Not able to get reading today but patient last BP was 118/66. Forgets to check BP at home due to husband health needs. Controlled on Amlodipine 2.'5mg'$  QD, Olmesartan '40mg'$  QD, Atenolol '100mg'$  QD. No headaches, dizziness, falls. ? ?3. Anxiety- Patient taking Clonazepam 0.'5mg'$  1/2 tab AM and PM for situational anxiety as needed. Patient struggling with husband on home hospice and gets really upset. ? ?4. COPD- I could not find any records on this. Patient denies SOB, coughing, wheezing. May need to take off diagnosis list. ? ?5. Malnutrition- Patient's sister comes in a few times weekly to help prepare meals. Patient states shes never hungry and has to make herself eat. Does not like Ensure and trys to remember water. ? ?6. Home help- Patient not able to do most ADLs. She is not cooking, cleaning, shopping for the house. Sister and brother help with most things around house. Nurse aids come in once daily to bathe and situate husband and Nurse comes weekly. Patient states husband has bouts of diarrhea, and she cannot help take care of him. She is sometimes weak. No falls at home. I would recommend PT/OT consult along with home health. ? ?Lawyer:: CCM Services:  This encounter meets complex CCM services and moderate to high medical decision making. ? ?Prior to outreach and patient  consent for Chronic Care Management, I referred this patient for services after reviewing the nominated patient list or from a personal encounter with the patient. ? ?I have personally reviewed this encounter including the documentation in this note and have collaborated with the care management provider regarding care management and care coordination activities to include development and update of the comprehensive care plan I am certifying that I agree with the content of this note and encounter as supervising physician. ? ? ?Chronic Conditions ?Patient's Chronic Conditions: Hypertension (HTN), Atrial Fibrillation, Diastolic Heart Failure / HFpEF, Chronic Obstructive Pulmonary Disease (COPD), Gastroesophageal Reflux Disease (GERD), Osteopenia or Osteoporosis, Hyperlipidemia/Dyslipidemia (HLD), Anxiety, Anemia, Vitamin D deficiency, Tortous aorta, Aortic atherosclerosis, Heptatic steatosis, chronic headache ? ?Disease Assessments ?Current BP: 118/66 ?Current HR: 54 ?taken on: 07/26/2021 ?Previous BP: 148/74 ?Previous HR: 60 ?taken on: 06/22/2021 ?Weight: 105 ?BMI: 18.02 ?Last GFR: 68 ?taken on: 07/26/2021 ?Visit Completed on: 08/29/2021 ?Why did the patient present?: CCM Initial ?Marital status?: Married ?Details: Lives at home with husband who is on hospice care ?What does the patient do during the day?: Takes care of husband ?Who does the patient spend their time with and what do they do?: Husband who is on hospice and has dementia. Brothers and sisters who come in the home to help patient. ?Lifestyle habits such as diet and exercise?: No exercise. Sister helps cook meals for patient. Patient states "she does not eat as much as she should". ?Alcohol, tobacco, and illicit drug usage?: None ?Factors that may affect medication adherence?: Financial hardship (medication copays) ?Name and location of Current pharmacy: [facility] ?Current Rx insurance plan:  HTA ?Are meds synced by current pharmacy?: No ?Are  meds delivered by current pharmacy?: No - delivery not available ?Would patient benefit from direct intervention of clinical lead in dispensing process to optimize clinical outcomes?: No ?Any additional demeanor/mood notes?: Patient polite but very hard of hearing over. Unsure of how beneficial visit was today for patient due to barriers. Has some help in home with ADLs from sister and brother. Husband is on in home hospice currently and patient needs help managing his and her care. ? ?Hypertension (HTN) ?Assess this condition today?: Yes ?Is patient able to obtain BP reading today?: No ?Goal: <130/80 mmHG ?Hypertension Stage: Normal (SBP <120 and DBP < 80) ?Is Patient checking BP at home?: No ?How often does patient miss taking their blood pressure medications?: Never ?Has patient experienced hypotension, dizziness, falls or bradycardia?: No ?BP RPM device: Does patient qualify?: Yes, but barriers exist for patient ?Document barriers: Not enrolling ?We discussed: Proper Home BP Measurement, DASH diet:  following a diet emphasizing fruits and vegetables and low-fat dairy products along with whole grains, fish, poultry, and nuts. Reducing red meats and sugars. ?Assessment:: Controlled ?Drug: Amlodipine 2.'5mg'$  QD ?Assessment: Appropriate, Effective, Safe, Accessible ?Drug: Olmesartan '40mg'$  QD ?Assessment: Appropriate, Effective, Safe, Accessible ?Drug: Atenolol '100mg'$  QD ?Assessment: Appropriate, Effective, Safe, Accessible ?Additional Info: Patient has BP machine at home but forgets to take BP. States its never running high. No headaches, dizziness, falls. ?Plan to Start: Check BP at home three times weekly ?Pharmacist Follow up: Will review at F/U 02/06/22 ? ?Hyperlipidemia/Dyslipidemia (HLD) ?Last Lipid panel on: 06/22/2021 ?TC (Goal<200): 168 ?LDL: 55 ?HDL (Goal>40): 92 ?TG (Goal<150): 120 ?ASCVD 10-year risk?is:: N/A due to Age > 72 ?Assess this condition today?: No ? ?Chronic Obstructive Pulmonary Disease  (COPD) ?Current FEV1/FVC: Unknown ?Current FEV1: Unknown ?Current Eosinophils: 49 ?taken on: 07/26/2021 ?CAT Score: Unknown ?Assess this condition today?: Yes ?Exacerbations in past year with hospitalization: No ?Is patient currently Smoking or Vaping?: No ?Did patient smoke/vape before?: No ?Home oxygen therapy: No ?Influenza vaccine: Yes ?Prevnar vaccine: Yes ?Pneumovax vaccine: Yes ?Assessment:: Controlled ?Drug: None ?Additional Info: Patient denies SOB, wheezing, coughing, trouble breathing. Not currently using any medications. Maybe error on diagnosis code? ?Plan to (other): Have PCP review diagnosis and remove if not active ?Pharmacist Follow up: Will have PCP review ? ?Anxiety ?Previous GAD-7 Score: Unknown ?Assess this condition today?: Yes ?Completing the GAD-7 Questionnaire today?: No ?In your opinion, how do you feel your anxiety symptoms have been controlled over the past 3 months?: Stable / stayed the same ?Have you ever had a panic attack?: No ?Are there any particular situations or events that make you very fearful (airplanes, heights, crowds, leaving the house)?: Husbands health fluctuations ?Assessment:: Controlled ?Drug: Clonazepam 0.'5mg'$  1/2 AM 1/2 PM ?Assessment: Appropriate, Effective, Query Safety ?Additional Info: Using for acute symptoms. Mainly situational due to husbands health status. States she never takes more than 1/2 tab BID ?HC Follow up: GAD-7 in 2 months ? ?Exercise, Diet and Non-Drug Coordination Needs ?Additional exercise counseling points. We discussed: decreasing sedentary behavior ?Additional diet counseling points. We discussed: the plate method eating plan, aiming to consume at least 8 cups of water day ?Discussed Non-Drug Care Coordination Needs: Yes ?Does Patient have Medication financial barriers?: Yes ?Patient has insurance  ?(list insurance): HTA ?and has trouble affording medications  ?(list number): Eliquis ?Their current copays are: $45 ?Goal: (pharmacist to fill in  plan to resolve) i.e. change medications, samples, PAP.: Explore copay assistance for patient. Has issues  in donut hole ?Patient meets income/out of pocket spend criteria for this medications patient assistance program

## 2021-09-11 ENCOUNTER — Other Ambulatory Visit: Payer: Self-pay | Admitting: Internal Medicine

## 2021-09-20 ENCOUNTER — Other Ambulatory Visit: Payer: Self-pay | Admitting: Internal Medicine

## 2021-09-20 DIAGNOSIS — I10 Essential (primary) hypertension: Secondary | ICD-10-CM

## 2021-09-22 DIAGNOSIS — E441 Mild protein-calorie malnutrition: Secondary | ICD-10-CM | POA: Diagnosis not present

## 2021-09-22 DIAGNOSIS — I10 Essential (primary) hypertension: Secondary | ICD-10-CM | POA: Diagnosis not present

## 2021-09-22 DIAGNOSIS — E782 Mixed hyperlipidemia: Secondary | ICD-10-CM | POA: Diagnosis not present

## 2021-09-22 DIAGNOSIS — F419 Anxiety disorder, unspecified: Secondary | ICD-10-CM | POA: Diagnosis not present

## 2021-09-29 ENCOUNTER — Encounter: Payer: Self-pay | Admitting: Cardiovascular Disease

## 2021-09-29 ENCOUNTER — Ambulatory Visit: Payer: PPO | Admitting: Cardiovascular Disease

## 2021-09-29 VITALS — BP 124/61 | HR 56 | Ht 60.0 in | Wt 104.2 lb

## 2021-09-29 DIAGNOSIS — I700 Atherosclerosis of aorta: Secondary | ICD-10-CM

## 2021-09-29 DIAGNOSIS — I480 Paroxysmal atrial fibrillation: Secondary | ICD-10-CM | POA: Diagnosis not present

## 2021-09-29 DIAGNOSIS — E7800 Pure hypercholesterolemia, unspecified: Secondary | ICD-10-CM

## 2021-09-29 DIAGNOSIS — I10 Essential (primary) hypertension: Secondary | ICD-10-CM | POA: Diagnosis not present

## 2021-09-29 NOTE — Progress Notes (Signed)
? ? ?Cardiology Office Note ? ? ?Date:  09/29/2021  ? ?ID:  Debra White, DOB 02/02/1934, MRN [id number] ? ?PCP:  Unk Pinto, MD  ?Cardiologist:   Sanda Klein, MD  ? ?Chief Complaint  ?Patient presents with  ? Atrial Fibrillation  ? ? ?  ?History of Present Illness: ?Debra White is a 88 y.o. female with hypertension, emphysema, chronic hyponatremia, atherosclerosis of the aorta and renal/splenic infarcts which were demonstrated to be likely secondary to paroxysmal atrial fibrillation.  Her husband [name] is also my patient. ? ?She really has no cardiovascular complaints and even her palpitations seem to have subsided.  She has poor sleep and poor appetite.  This is largely related to distress over the steadily declining cognitive and physical abilities of her husband.  He has dementia and is now bedridden.  She has support from hospice.  She tried having him in a nursing home for about a month, but then decided to bring him home. ? ?She has lost even more weight, about 5 pounds since her last appointment.  Her BMI is now borderline low at 20. ? ?She denies chest pain or shortness of breath at rest or with activity, dizziness, syncope, falls, injuries or bleeding, abdominal pain, new focal neurological complaints, edema or claudication.  She does not have orthopnea or PND. ? ?She is on dose adjusted apixaban due to age and small body size. ? ?Most recent labs showed an LDL cholesterol 55 and an exceptional HDL of 92.  Hemoglobin A1c was 5.8% borderline high despite her very small body size.  She has normal renal function and her most recent hemoglobin was normal at 12.5. ? ?Previous CT of the abdomen shows extensive atherosclerosis of the aorta as well diverticulosis.  ? ?She had an echo 09/2019 that revealed LVEF 55 to 60% with indeterminate diastolic function.  She has severe biatrial dilation, mild mitral insufficiency, moderate tricuspid insufficiency and aortic valve sclerosis without  stenosis. ? ? ?Past Medical History:  ?Diagnosis Date  ? Acute on chronic combined systolic and diastolic CHF (congestive heart failure) ([place]) 06/29/2019  ? Anxiety   ? Arthritis   ? Blood transfusion   ? C. difficile colitis   ? Candida esophagitis ([place]) 2013  ? EGD  ? Cataract   ? Chronic kidney disease   ? STONES  ? Diverticulitis 04/05/2017  ? Diverticulosis of colon (without mention of hemorrhage) 2007  ? Colonoscopy  ? Dysrhythmia   ? RX  ? Emphysema of lung ([place])   ? Family history of colon cancer   ? sister  ? Fibromyalgia   ? Fracture, zygoma closed ([place]) 07/13/2011  ? GERD (gastroesophageal reflux disease)   ? Headache(784.0)   ? Hyperlipemia   ? Hypertension   ? Impingement syndrome of left shoulder 09/25/2016  ? Impingement syndrome of right shoulder 09/25/2016  ? Kidney stones   ? Ptosis, bilateral   ? Renal infarct ([place])   ? Splenic infarct   ? ? ?Past Surgical History:  ?Procedure Laterality Date  ? ABDOMINAL HYSTERECTOMY    ? BACK SURGERY    ? X2  ? BACK SURGERY    ? BREAST EXCISIONAL BIOPSY    ? CERVICAL DISC SURGERY    ? COLONOSCOPY    ? FACIAL FRACTURE SURGERY    ? HAND SURGERY    ? BIL   ? ORIF TRIPOD FRACTURE  07/13/2011  ? Procedure: OPEN REDUCTION INTERNAL FIXATION (ORIF) TRIPOD FRACTURE;  Surgeon: Tyson Alias, MD;  Location: [facility] OR;  Service: ENT;  Laterality: Right;  ORIF RIGHT ZYGOMA, ORBITAL FLOOR EXPLORATION WITH FROST STITCH (TEMPORARY TARSORRHAPHY)  ? PTOSIS REPAIR Bilateral 11/22/2016  ? Procedure: INTERNAL PTOSIS REPAIR;  Surgeon: Clista Bernhardt, MD;  Location: [facility];  Service: Ophthalmology;  Laterality: Bilateral;  ? SHOULDER ARTHROSCOPY W/ ROTATOR CUFF REPAIR    ? LFT  ? ? ? ?Current Outpatient Medications  ?Medication Sig Dispense Refill  ? acetaminophen (TYLENOL) 500 MG tablet Take 1,000 mg by mouth every 8 (eight) hours as needed for headache.     ? amLODipine (NORVASC) 2.5 MG tablet Take 1 tablet (2.5 mg total) by mouth daily. 90 tablet 3  ? apixaban (ELIQUIS) 2.5 MG TABS tablet  Take  1 Tablet  2 x /day (every 12 hours) to Prevent Blood Clots 180 tablet 1  ? atenolol (TENORMIN) 100 MG tablet Take 1 tablet daily for blood pressure 90 tablet 3  ? clonazePAM (KLONOPIN) 0.5 MG tablet TAKE 1/2 TO 1 TABLET BY MOUTH 2-3 TIMES DAILY AS NEEDED FOR ANXIETY ATTACKS LIMIT TO 5 DAYS A WEEK TO AVOID ADDICTION AND DEMENTIA 60 tablet 0  ? conjugated estrogens (PREMARIN) vaginal cream Place 1 Applicatorful vaginally See admin instructions. Apply one applicatorful vaginally once a week    ? Cyanocobalamin (VITAMIN B 12 PO) Take 1 tablet by mouth daily.    ? diclofenac Sodium (VOLTAREN) 1 % GEL APPLY 2 GRAMS TOPICALLY 3-4 TIMES DAILY AS NEEDED FOR PAIN 350 g 3  ? dicyclomine (BENTYL) 20 MG tablet TAKE 1 TABLET BY MOUTH FOUR TIMES DAILY WITH MEALS AND BEDTIME FOR CRAMPING  NAUSEA  OR BLOATING 360 tablet 1  ? gabapentin (NEURONTIN) 600 MG tablet Take  1 tablet  3 x /day  for Facial Pain 270 tablet 1  ? lovastatin (MEVACOR) 20 MG tablet TAKE 1 TABLET BY MOUTH EACH NIGHT AT BEDTIME FOR CHOLESTEROL 90 tablet 3  ? olmesartan (BENICAR) 40 MG tablet TAKE 1 TABLET BY MOUTH DAILY FOR BLOOD PRESSURE 90 tablet 3  ? omeprazole (PRILOSEC) 40 MG capsule TAKE 1 CAPSULE BY MOUTH DAILY BEFORE BREAKFAST 90 capsule 1  ? polyvinyl alcohol (LIQUIFILM TEARS) 1.4 % ophthalmic solution Place 1 drop into both eyes 2 (two) times daily as needed for dry eyes.    ? Vaginal Lubricant (REPLENS) GEL Place vaginally daily.    ? Vitamin D-Vitamin K (VITAMIN K2-VITAMIN D3 PO) Take by mouth. 90 mcg of K2, 5,000 units of D3    ? ?No current facility-administered medications for this visit.  ? ? ?Allergies:   Latex, Amoxicillin, Aspirin, Barbiturates, Celebrex [celecoxib], Codeine, Evista [raloxifene], Lipitor [atorvastatin], Morphine and related, [place] [ketoprofen], Pantoprazole, Paraffin, Prochlorperazine, Proloprim [trimethoprim], Venlafaxine, Vibra-tab [doxycycline], Vioxx [rofecoxib], Betadine [povidone iodine], and Caffeine  ? ? ?Social  History:  The patient  reports that she has never smoked. She has never been exposed to tobacco smoke. She has never used smokeless tobacco. She reports that she does not drink alcohol and does not use drugs.  ? ?Family History:  The patient's family history includes Breast cancer (age of onset: 53) in her sister; Cancer in her sister, sister, and sister; Colon cancer in her sister; Heart attack in her father; Heart disease in her father, mother, and sister; Hyperlipidemia in her daughter; Hypertension in her daughter.  ? ? ?ROS:  Please see the history of present illness.   Otherwise, review of systems are positive for none.   All other systems are reviewed and negative.  ? ? ?  PHYSICAL EXAM: ?VS:  BP 124/61   Pulse (!) 56   Ht 5' (1.524 m)   Wt 104 lb 3.2 oz (47.3 kg)   SpO2 99%   BMI 20.35 kg/m?  , BMI Body mass index is 20.35 kg/m?. ? ?General: Alert, oriented x3, no distress, very lean, appears rather frail ?Head: no evidence of trauma, PERRL, EOMI, no exophtalmos or lid lag, no myxedema, no xanthelasma; normal ears, nose and oropharynx ?Neck: normal jugular venous pulsations and no hepatojugular reflux; brisk carotid pulses without delay and no carotid bruits ?Chest: clear to auscultation, no signs of consolidation by percussion or palpation, normal fremitus, symmetrical and full respiratory excursions ?Cardiovascular: normal position and quality of the apical impulse, regular rhythm, normal first and second heart sounds, no murmurs, rubs or gallops ?Abdomen: no tenderness or distention, no masses by palpation, no abnormal pulsatility or arterial bruits, normal bowel sounds, no hepatosplenomegaly ?Extremities: no clubbing, cyanosis or edema; 2+ radial, ulnar and brachial pulses bilaterally; 2+ right femoral, posterior tibial and dorsalis pedis pulses; 2+ left femoral, posterior tibial and dorsalis pedis pulses; no subclavian or femoral bruits ?Neurological: grossly nonfocal ?Psych: Normal mood and  affect ? ? ? ?EKG:  EKG is not ordered today. ?The ekg ordered 06/22/2021 demonstrates mild sinus bradycardia, otherwise normal tracing ? ?Echo 10/11/2019 ? 1. Left ventricular ejection fraction, by estimation, is 55 to 60%

## 2021-09-29 NOTE — Patient Instructions (Signed)

## 2021-10-12 ENCOUNTER — Other Ambulatory Visit: Payer: Self-pay | Admitting: Internal Medicine

## 2021-10-12 DIAGNOSIS — G500 Trigeminal neuralgia: Secondary | ICD-10-CM

## 2021-10-16 ENCOUNTER — Ambulatory Visit: Payer: PPO

## 2021-10-20 ENCOUNTER — Ambulatory Visit (INDEPENDENT_AMBULATORY_CARE_PROVIDER_SITE_OTHER): Payer: PPO | Admitting: Adult Health

## 2021-10-20 ENCOUNTER — Encounter: Payer: Self-pay | Admitting: Adult Health

## 2021-10-20 ENCOUNTER — Ambulatory Visit: Payer: PPO | Admitting: Nurse Practitioner

## 2021-10-20 VITALS — BP 130/70 | HR 64 | Temp 97.9°F | Resp 16 | Ht 64.0 in | Wt 104.6 lb

## 2021-10-20 DIAGNOSIS — R7309 Other abnormal glucose: Secondary | ICD-10-CM | POA: Diagnosis not present

## 2021-10-20 DIAGNOSIS — I771 Stricture of artery: Secondary | ICD-10-CM | POA: Diagnosis not present

## 2021-10-20 DIAGNOSIS — Z79899 Other long term (current) drug therapy: Secondary | ICD-10-CM

## 2021-10-20 DIAGNOSIS — E441 Mild protein-calorie malnutrition: Secondary | ICD-10-CM | POA: Diagnosis not present

## 2021-10-20 DIAGNOSIS — I10 Essential (primary) hypertension: Secondary | ICD-10-CM | POA: Diagnosis not present

## 2021-10-20 DIAGNOSIS — D6859 Other primary thrombophilia: Secondary | ICD-10-CM

## 2021-10-20 DIAGNOSIS — Z0001 Encounter for general adult medical examination with abnormal findings: Secondary | ICD-10-CM

## 2021-10-20 DIAGNOSIS — J439 Emphysema, unspecified: Secondary | ICD-10-CM | POA: Diagnosis not present

## 2021-10-20 DIAGNOSIS — I4891 Unspecified atrial fibrillation: Secondary | ICD-10-CM | POA: Diagnosis not present

## 2021-10-20 DIAGNOSIS — R6889 Other general symptoms and signs: Secondary | ICD-10-CM

## 2021-10-20 DIAGNOSIS — N280 Ischemia and infarction of kidney: Secondary | ICD-10-CM | POA: Diagnosis not present

## 2021-10-20 DIAGNOSIS — I700 Atherosclerosis of aorta: Secondary | ICD-10-CM

## 2021-10-20 DIAGNOSIS — E538 Deficiency of other specified B group vitamins: Secondary | ICD-10-CM

## 2021-10-20 DIAGNOSIS — E782 Mixed hyperlipidemia: Secondary | ICD-10-CM

## 2021-10-20 DIAGNOSIS — I5032 Chronic diastolic (congestive) heart failure: Secondary | ICD-10-CM | POA: Diagnosis not present

## 2021-10-20 DIAGNOSIS — Z0000 Encounter for general adult medical examination without abnormal findings: Secondary | ICD-10-CM

## 2021-10-20 NOTE — Progress Notes (Signed)
ANNUAL WELLNESS VISIT AND FOLLOW UP ? ? ?Assessment:  ? ? ?Annual Medicare Wellness Visit ?Due annually  ?Health maintenance reviewed ?- declines tetanus, shingrix at this time  ? ?Tortuous aorta (HCC) ?Control blood pressure, cholesterol, glucose, increase exercise.  ? ?Renal infarct [facility]) ?Continue on elliquis ? ?Atrial fibrillation with controlled ventricular response (HCC)/ thombophilia (HCC) ?Continue elliquis, she is rate controlled, continue follow up cardio ?No concerns with bleeding ? ?Aortic atherosclerosis ([place]) - per CT 05/2020 ?Control blood pressure, cholesterol, glucose, increase exercise.  ? ?Chronic diastolic congestive heart failure ([place]) ?Monitor, no symptoms of fluid overload at this time, patient having weight loss, no swelling, no PND, orthopenia ? ?Pulmonary emphysema, unspecified emphysema type ([place]) ?Per CXR; likely age related; denies sx; monitor ? ?Mild malnutrition ([place]) ?Resume remeron PRN appetite if weights trending back down . Stressed importance of protein and high caloric foods ?? Lactose intolerance, can't do boost/ensure, suggested she look for lactose/dairy free protein shake at grocery, suggestions given ?-     COMPLETE METABOLIC PANEL WITH GFR ? ?Essential hypertension ?-     CBC with Differential/Platelet ?-     COMPLETE METABOLIC PANEL WITH GFR ?-     TSH ?- Microalbumin/creatinine ratio ?- Urine routine with reflex microscopic ?- continue medications, DASH diet, exercise and monitor at home. Call if greater than 130/80.  ? ?Coronary atherosclerosis due to lipid rich plaque ?Control blood pressure, cholesterol, glucose, increase exercise.  ? ?Vitamin D deficiency ?Continue supplement ? ?Medication management ?-     Magnesium ? ?Mixed hyperlipidemia ?-     Lipid panel ?check lipids ?decrease fatty foods ?increase activity.  ?  ?History of embolic stroke ?Continue eliquis ?Control blood pressure, cholesterol, glucose, increase exercise.  ? ?Chronic post-traumatic headache, not  intractable ?Continue klonopin, try to limit use ?Gabapentin helpful ? ?Atypical facial pain ?Gabapentin helpful  ? ?Hepatic steatosis ?Check labs, avoid tylenol, alcohol  ? ?Gastroesophageal reflux disease, unspecified whether esophagitis present ?Continue PPI/H2 blocker, diet discussed ? ?Osteoporosis, unspecified osteoporosis type, unspecified pathological fracture presence  ?DEXA UTD 03/2021, osteoporosis with forearm T score 3.7 ?Continue Vit D and high calcium diet, weight bearing exercises encouraged ? ?Abnormal glucose/ Prediabetes ?Discussed disease and risks ?Discussed diet/exercise, weight management  ?A1C q61m ? ?B12 def ?On supplement, recheck levels  ? ?Orders Placed This Encounter  ?Procedures  ? CBC with Differential/Platelet  ? COMPLETE METABOLIC PANEL WITH GFR  ? Magnesium  ? Lipid panel  ? TSH  ? Hemoglobin A1c  ? Vitamin B12  ? ? ? ?Over 40 minutes of exam, counseling, chart review and critical decision making was performed ?Future Appointments  ?Date Time Provider Circle  ?02/06/2022 11:00 AM Carleene Mains, RPH [facility] None  ?06/22/2022 10:00 AM Magda Bernheim, NP [facility] None  ?10/22/2022 10:00 AM Mull, Townsend Roger, NP [facility] None  ? ? ? ? ? ?Subjective:  ?Debra White is a 88 y.o. female who presents for Medicare Annual Wellness Vist and 3 Month follow up.  ? ?Husband of 3 years(advanced dementia on Alzheimer's) passed a few weeks ago in his sleep at home. She reports she is doing as well as can be expected, peaceful. She is currently living with her sister (does still have her home close by), daughter and nephew still living. Reports has POA and living will with lawyer, copies requested.  ? ?She reports GERD is well controlled by omeprazole 20 mg daily. Uses bentyl occasionally for cramping and diarrhea.  ? ?She has residual face  pain related to fall and zygomatic fracture with hardware implanted, manages pain with gabapentin 600 mg TID. She follows with Dr. Berenice Primas as  needed.  ? ?BMI is Body mass index is 17.95 kg/m?., she is working on diet, minimal intentional exercise was very limited as active . Uses remeron intermittently to help with appetite and maintain weight.  ?Wt Readings from Last 3 Encounters:  ?10/20/21 104 lb 9.6 oz (47.4 kg)  ?09/29/21 104 lb 3.2 oz (47.3 kg)  ?07/26/21 105 lb (47.6 kg)  ? ?She had splenic and renal infarcts in 2018, history of CVA and on Eliquis 2.5 mg BID for Afib.  CAD, diastolic CHF, Echo 3016 EF 55-60% with severe LAE. Declines TEE and loop recorder, follows with cardiology Dr. Oval Linsey. She also has aortic atherosclerosis per CT 05/2020.  ? ? Her blood pressure has not been checking BP at home, today their BP is BP: 130/70 ?BP Readings from Last 3 Encounters:  ?10/20/21 130/70  ?09/29/21 124/61  ?07/26/21 118/66  ?  ?She does not workout. She denies chest pain, shortness of breath, dizziness.  ? ?She is on cholesterol medication (lovastatin 20 mg daily) and denies myalgias at the current dose, had myalgias with higher doses. Her cholesterol is not at goal. The cholesterol last visit was:   ?Lab Results  ?Component Value Date  ? CHOL 168 06/22/2021  ? HDL 92 06/22/2021  ? [place] 55 06/22/2021  ? TRIG 120 06/22/2021  ? CHOLHDL 1.8 06/22/2021  ? ?She has prediabetes.  has not been working on diet and exercise for prediabetes, and denies hyperglycemia, hypoglycemia , nausea, polydipsia, polyuria and visual disturbances. Last A1C in the office was:  ?Lab Results  ?Component Value Date  ? HGBA1C 5.8 (H) 06/22/2021  ? ?Last GFR: ?Lab Results  ?Component Value Date  ? EGFR 68 07/26/2021  ? ?Patient is on Vitamin D supplement.   ?Lab Results  ?Component Value Date  ? VD25OH 93 12/15/2019  ? ?She has been on b12 supplemnt -  ?Lab Results  ?Component Value Date  ? [id number] 402 10/06/2019  ? ? ? ?Medication Review: ? ? ?Current Outpatient Medications (Cardiovascular):  ?  atenolol (TENORMIN) 100 MG tablet, Take 1 tablet daily for blood pressure ?   lovastatin (MEVACOR) 20 MG tablet, TAKE 1 TABLET BY MOUTH EACH NIGHT AT BEDTIME FOR CHOLESTEROL ?  olmesartan (BENICAR) 40 MG tablet, TAKE 1 TABLET BY MOUTH DAILY FOR BLOOD PRESSURE ?  amLODipine (NORVASC) 2.5 MG tablet, Take 1 tablet (2.5 mg total) by mouth daily. ? ? ?Current Outpatient Medications (Analgesics):  ?  acetaminophen (TYLENOL) 500 MG tablet, Take 1,000 mg by mouth every 8 (eight) hours as needed for headache.  ? ?Current Outpatient Medications (Hematological):  ?  apixaban (ELIQUIS) 2.5 MG TABS tablet, Take  1 Tablet  2 x /day (every 12 hours) to Prevent Blood Clots ?  Cyanocobalamin (VITAMIN B 12 PO), Take 1 tablet by mouth daily. ? ?Current Outpatient Medications (Other):  ?  clonazePAM (KLONOPIN) 0.5 MG tablet, TAKE 1/2 TO 1 TABLET BY MOUTH 2-3 TIMES DAILY AS NEEDED FOR ANXIETY ATTACKS LIMIT TO 5 DAYS A WEEK TO AVOID ADDICTION AND DEMENTIA ?  conjugated estrogens (PREMARIN) vaginal cream, Place 1 Applicatorful vaginally See admin instructions. Apply one applicatorful vaginally once a week ?  diclofenac Sodium (VOLTAREN) 1 % GEL, APPLY 2 GRAMS TOPICALLY 3-4 TIMES DAILY AS NEEDED FOR PAIN ?  dicyclomine (BENTYL) 20 MG tablet, TAKE 1 TABLET BY MOUTH FOUR TIMES DAILY WITH  MEALS AND BEDTIME FOR CRAMPING  NAUSEA  OR BLOATING ?  gabapentin (NEURONTIN) 600 MG tablet, TAKE 1/2 TO 1 TABLET BY MOUTH 3 TIMES DAILY FOR FACIAL PAIN ?  omeprazole (PRILOSEC) 40 MG capsule, TAKE 1 CAPSULE BY MOUTH DAILY BEFORE BREAKFAST ?  polyvinyl alcohol (LIQUIFILM TEARS) 1.4 % ophthalmic solution, Place 1 drop into both eyes 2 (two) times daily as needed for dry eyes. ?  Vaginal Lubricant (REPLENS) GEL, Place vaginally daily. ?  Vitamin D-Vitamin K (VITAMIN K2-VITAMIN D3 PO), Take by mouth. 90 mcg of K2, 5,000 units of D3 ? ? ?Allergies  ?Allergen Reactions  ? Latex Hives  ?  TONGUE HAD BLISTERS WHEN HAD DENTAL SURGERY  ? Amoxicillin Nausea And Vomiting  ? Aspirin Other (See Comments)  ?  Gi upset  ? Barbiturates Other (See  Comments)  ?  Unknown reaction  ? Celebrex [Celecoxib] Other (See Comments)  ?  Unknown reaction  ? Codeine Itching  ? Evista [Raloxifene] Other (See Comments)  ?  Unknown reaction  ? Lipitor [Atorvastatin] O

## 2021-10-21 LAB — CBC WITH DIFFERENTIAL/PLATELET
Absolute Monocytes: 630 cells/uL (ref 200–950)
Basophils Absolute: 40 cells/uL (ref 0–200)
Basophils Relative: 0.6 %
Eosinophils Absolute: 107 cells/uL (ref 15–500)
Eosinophils Relative: 1.6 %
HCT: 36.6 % (ref 35.0–45.0)
Hemoglobin: 12 g/dL (ref 11.7–15.5)
Lymphs Abs: 1454 cells/uL (ref 850–3900)
MCH: 30.8 pg (ref 27.0–33.0)
MCHC: 32.8 g/dL (ref 32.0–36.0)
MCV: 94.1 fL (ref 80.0–100.0)
MPV: 10.8 fL (ref 7.5–12.5)
Monocytes Relative: 9.4 %
Neutro Abs: 4469 cells/uL (ref 1500–7800)
Neutrophils Relative %: 66.7 %
Platelets: 212 10*3/uL (ref 140–400)
RBC: 3.89 10*6/uL (ref 3.80–5.10)
RDW: 12.3 % (ref 11.0–15.0)
Total Lymphocyte: 21.7 %
WBC: 6.7 10*3/uL (ref 3.8–10.8)

## 2021-10-21 LAB — COMPLETE METABOLIC PANEL WITH GFR
AG Ratio: 1.9 (calc) (ref 1.0–2.5)
ALT: 15 U/L (ref 6–29)
AST: 16 U/L (ref 10–35)
Albumin: 4.8 g/dL (ref 3.6–5.1)
Alkaline phosphatase (APISO): 72 U/L (ref 37–153)
BUN: 15 mg/dL (ref 7–25)
CO2: 32 mmol/L (ref 20–32)
Calcium: 9.9 mg/dL (ref 8.6–10.4)
Chloride: 96 mmol/L — ABNORMAL LOW (ref 98–110)
Creat: 0.75 mg/dL (ref 0.60–0.95)
Globulin: 2.5 g/dL (calc) (ref 1.9–3.7)
Glucose, Bld: 98 mg/dL (ref 65–99)
Potassium: 4.6 mmol/L (ref 3.5–5.3)
Sodium: 138 mmol/L (ref 135–146)
Total Bilirubin: 0.4 mg/dL (ref 0.2–1.2)
Total Protein: 7.3 g/dL (ref 6.1–8.1)
eGFR: 78 mL/min/{1.73_m2} (ref >=60)

## 2021-10-21 LAB — VITAMIN B12: Vitamin B-12: 1445 pg/mL — ABNORMAL HIGH (ref 200–1100)

## 2021-10-21 LAB — LIPID PANEL
Cholesterol: 173 mg/dL (ref <200)
HDL: 92 mg/dL (ref >=50)
LDL Cholesterol (Calc): 65 mg/dL (calc)
Non-HDL Cholesterol (Calc): 81 mg/dL (calc) (ref <130)
Total CHOL/HDL Ratio: 1.9 (calc) (ref <5.0)
Triglycerides: 84 mg/dL (ref <150)

## 2021-10-21 LAB — HEMOGLOBIN A1C
Hgb A1c MFr Bld: 5.9 % of total Hgb — ABNORMAL HIGH (ref <5.7)
Mean Plasma Glucose: 123 mg/dL
eAG (mmol/L): 6.8 mmol/L

## 2021-10-21 LAB — TSH: TSH: 0.44 mIU/L (ref 0.40–4.50)

## 2021-10-21 LAB — MAGNESIUM: Magnesium: 1.8 mg/dL (ref 1.5–2.5)

## 2021-10-24 ENCOUNTER — Other Ambulatory Visit: Payer: Self-pay | Admitting: Nurse Practitioner

## 2021-10-24 DIAGNOSIS — F419 Anxiety disorder, unspecified: Secondary | ICD-10-CM

## 2021-10-24 MED ORDER — CLONAZEPAM 0.5 MG PO TABS: ORAL_TABLET | ORAL | 0 refills | Status: DC

## 2021-10-24 NOTE — Progress Notes (Signed)
PDMP reviewed this encounter. ?

## 2021-10-26 DIAGNOSIS — M5451 Vertebrogenic low back pain: Secondary | ICD-10-CM | POA: Diagnosis not present

## 2021-10-26 DIAGNOSIS — M542 Cervicalgia: Secondary | ICD-10-CM | POA: Diagnosis not present

## 2021-11-09 ENCOUNTER — Telehealth: Payer: Self-pay

## 2021-11-09 NOTE — Telephone Encounter (Signed)
LM-11/09/21-Calling pt. To complete GAD-7 assessment. Spoke to pt. And completed GAD-7 to the best of my ability. Pt. Is a poor historian but was able to answer the questions to the best of her ability.  Anxiety and/or Depression Review (HC) Chart Review What recent interventions have been made by any provider to improve the patient's conditions in the last 3 months?: 07/26/21-Ashley Corbett, NP- Pt. presented for evaluation of a syncope episode. No medication changes noted. 09/29/21-Croitoru, Dani Gobble, MD (Cardiology)-Pt. presented for afib evaluation, No medication changes noted. 10/20/21-Dana Mull, NP- Pt. presented for AWV and f/u. Resume remeron PRN appetite if weights trending back down No medication changes noted. Any recent hospitalizations or ED visits since last visit with CPP?: No  Adherence Review Adherence rates for STAR metric medications:  Olmesartan '40mg'$  / 90DS / 06/27/21 & 09/20/21  Lovastatin '20mg'$  / 90DS / 07/25/21 & 10/23/21  Amlodipine 2.'5mg'$  / 90DS / 06/27/21 & 09/30/21 Adherence rates for medications indicated for disease state being reviewed:  Clonazepam 0.'5mg'$  / 20DS / 07/11/21 & 09/11/21 Does the patient have >5 day gap between last estimated fill dates for any of the above medications?: Yes Reasons for medication gaps: Clonazepam is PRN. Pt. instructed to take no more than 5 days a week to avoid addiction  Disease State Questions Able to connect with the Patient?: Yes Patient has (Multiselect): Anxiety Anxiety (GAD-7): Over the last 2 weeks, how often have you been bothered by the following problems?: Done Feeling nervous, anxious or on edge: 3 (Nearly every day) Not being able to stop or control worrying: 3 (Nearly every day) Worrying too much about different things: 1 (several days) Trouble relaxing: 0 (Not at all) Being so restless that it is hard to sit still: 0 (Not at all) Becoming easily annoyed or irritable: 0 (Not at all) Feeling afraid, as in something awful  might happen: 0 (Not at all) Total GAD-7 Score: 7 Anxiety Severity: Mild Anxiety (Score 5-9) In your opinion, how do you feel your anxiety / depression symptoms have been controlled over the past 3 months?: Stable / stayed the same Thank patient for answering questions. Your score shows "severity of anxiety / depression". If either score is 10 or above, I advised patient that I will pass this along to the clinical lead to review and discuss further with patient.: Done Misc. Response/Information:: Pt. is a poor historian. Pt. mentioned her husband passed away recently and she has trouble at times with feeling anxious, but has a good support system (sister, neighbors, church family) and goes to church regularly to keep herself productive.  Total time spent: 21 min.

## 2021-11-09 NOTE — Telephone Encounter (Signed)
11/09/21-Chart review started. Reviewing OV, Consults, Hospital visits, Labs and medication changes.  Chart review completed. Calling pt. To complete GAD-7 assessment. Spoke to pts. Sister who stated Debra White was not available and requested I call her around 2pm this afternoon to complete assessment.   Total time spent: 16 min.

## 2021-11-10 ENCOUNTER — Ambulatory Visit
Admission: RE | Admit: 2021-11-10 | Discharge: 2021-11-10 | Disposition: A | Discharge status: Home / Self Care | Payer: PPO | Source: Ambulatory Visit | Attending: Internal Medicine | Admitting: Internal Medicine

## 2021-11-10 DIAGNOSIS — Z1231 Encounter for screening mammogram for malignant neoplasm of breast: Secondary | ICD-10-CM

## 2021-11-12 NOTE — Progress Notes (Signed)
Future Appointments  Date Time Provider Department  11/13/2021                            6 mo OV 10:30 AM Unk Pinto, MD [facility]  02/06/2022 11:00 AM Carleene Mains, RPH [facility]  06/22/2022                        CPE   10:00 AM Magda Bernheim, NP [facility]  10/22/2022                         Wellness 10:00 AM Magda Bernheim, NP [facility]    History of Present Illness:       This very nice 88 y.o. recently widowed WF  (March)  presents for 6 month follow up with HTN, HLD, Pre-Diabetes and Vitamin D Deficiency.                                                     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          Patient is treated for HTN  since  1990 & BP has been controlled at home. Today's BP: 120/72. In 2019, she was started on Eliquis for hx/o Splenic and Rt Renal infarcts and in June 2020, she was discovered in Afib.  Patient has had no complaints of any cardiac type chest pain, palpitations, dyspnea / orthopnea / PND, dizziness, claudication, or dependent edema.       Hyperlipidemia is controlled with diet & meds. Patient denies myalgias or other med SE's. Last Lipids were at goal :   Lab Results  Component Value Date   CHOL 173 10/20/2021   HDL 92 10/20/2021   LDLCALC 65 10/20/2021   TRIG 84 10/20/2021   CHOLHDL 1.9 10/20/2021     Also, the patient has history of  T2-DM (A1c 6.8%/2001)  and after a 30# weight loss after her facial fx her  A1c 5.8%/2013 as in the preDiabetic range and then Normal - 5.3% in 2016 . In essence she is a diet controlled T2_DM with CKD 2  (GFR 78) .  She  has had no symptoms of reactive hypoglycemia, diabetic polys, paresthesias or visual blurring.  Last A1c was  still in the early or PreDiabetic range :  Lab Results  Component Value Date   HGBA1C 5.9 (H) 10/20/2021                                                    Further, the patient also  has history of Vitamin D Deficiency and supplements vitamin D . Last vitamin D was   Lab Results  Component Value Date   VD25OH 93 12/15/2019     Current Outpatient Medications on File Prior to Visit  Medication Sig   acetaminophen  500 MG tablet Take 1,000 mg  every 8 (eight) hours as needed for headache.    amLODipine 2.5 MG tablet Take 1 tablet daily.  apixaban (ELIQUIS) 2.5 MG TABS tablet Take  1 Tablet  2 x /day    atenolol (100 MG tablet Take 1 tablet daily    clonazePAM 0.5 MG tablet TAKE 1/2 TO 1 TABLET 2-3 TIMES DAILY AS NEEDED FOR ANXIETY    PREMARIN vaginal cream Place vaginally  once a week   VITAMIN B 12  Take 1 tablet daily.   diclofenac  1 % GEL APPLY 2 GRAMS TOPICALLY 3-4 TIMES DAILY AS NEEDED FOR PAIN   dicyclomine (BENTYL) 20 MG tablet TAKE 1 TABLET  FOUR TIMES DAILY WITH MEALS AND BEDTIME    Gabapentin 600 MG tablet TAKE 1/2 TO 1 TABLET  3 TIMES DAILY FOR FACIAL PAIN   lovastatin  20 MG tablet TAKE 1 TABLET EACH NIGHT A   olmesartan 40 MG tablet TAKE 1 TABLET DAILY    omeprazole  40 MG capsule TAKE 1 CAPSULE  DAILY B   LIQUIFILM TEARS 1.4 % ophth soln Place 1 drop into both eyes 2  times daily as needed   Vaginal Lubricant /REPLENS GEL Place vaginally daily.   VIT\\ K2    90 mcg -VIT D 5,000 units  Take daily      Allergies  Allergen Reactions   Latex Hives    TONGUE HAD BLISTERS WHEN HAD DENTAL SURGERY   Amoxicillin Nausea And Vomiting   Aspirin Other (See Comments)    Gi upset   Barbiturates Other (See Comments)    Unknown reaction   Celebrex [Celecoxib] Other (See Comments)    Unknown reaction   Codeine Itching   Evista [Raloxifene] Other (See Comments)    Unknown reaction   Lipitor [Atorvastatin] Other (See Comments)    Unknown reaction   Morphine And Related Other (See Comments)    Unknown reaction   Oruvail [Ketoprofen] Other (See Comments)    Unknown reaction   Pantoprazole Diarrhea   Paraffin Other (See Comments)    Unknown reaction    Prochlorperazine Other (See Comments)    Uncontrolled shaking   Proloprim [Trimethoprim] Other (See Comments)    Unknown reaction   Venlafaxine Nausea Only   Vibra-Tab [Doxycycline] Other (See Comments)    Unknown reaction   Vioxx [Rofecoxib] Other (See Comments)    edema   Betadine [Povidone Iodine] Rash   Caffeine Palpitations     PMHx:   Past Medical History:  Diagnosis Date   Acute on chronic combined systolic and diastolic CHF (congestive heart failure) ([place]) 06/29/2019   Anxiety    Arthritis    Blood transfusion    C. difficile colitis    Candida esophagitis ([place]) 2013   EGD   Cataract    Chronic kidney disease    STONES   Diverticulitis 04/05/2017   Diverticulosis of colon (without mention of hemorrhage) 2007   Colonoscopy   Dysrhythmia    RX   Emphysema of lung ([place])    Family history of colon cancer    sister   Fibromyalgia    Fracture, zygoma closed ([place]) 07/13/2011   GERD (gastroesophageal reflux disease)    Headache(784.0)    Hyperlipemia    Hypertension    Impingement syndrome of left shoulder 09/25/2016   Impingement syndrome of right shoulder 09/25/2016   Iron deficiency anemia 05/31/2020   Kidney stones    Ptosis, bilateral    Renal infarct [facility])    Splenic infarct      Immunization History  Administered Date(s) Administered   Influenza, High Dose 06/02/2018, 03/12/2019, 03/23/2020, 05/02/2021  Influenza 03/09/2013, 03/11/2015   PFIZER-SARS-COV-2 Vacc 06/01/2020, 06/22/2020   Pneumococcal -13 12/21/2015   Pneumococcal -23 06/25/2002   Td 06/26/2003     Past Surgical History:  Procedure Laterality Date   ABDOMINAL HYSTERECTOMY     BACK SURGERY     X2   BACK SURGERY     BREAST EXCISIONAL BIOPSY     CERVICAL DISC SURGERY     COLONOSCOPY     FACIAL FRACTURE SURGERY     HAND SURGERY     BIL    ORIF TRIPOD FRACTURE  07/13/2011   Procedure: OPEN REDUCTION INTERNAL FIXATION (ORIF) TRIPOD FRACTURE;  Surgeon: Tyson Alias, MD;  Location: [facility];  Service: ENT;  Laterality: Right;  ORIF RIGHT ZYGOMA, ORBITAL FLOOR EXPLORATION WITH FROST STITCH (TEMPORARY TARSORRHAPHY)   PTOSIS REPAIR Bilateral 11/22/2016   Procedure: INTERNAL PTOSIS REPAIR;  Surgeon: Clista Bernhardt, MD;  Location: [facility];  Service: Ophthalmology;  Laterality: Bilateral;   SHOULDER ARTHROSCOPY W/ ROTATOR CUFF REPAIR     LFT    FHx:    Reviewed / unchanged  SHx:    Reviewed / unchanged   Systems Review:  Constitutional: Denies fever, chills, wt changes, headaches, insomnia, fatigue, night sweats, change in appetite. Eyes: Denies redness, blurred vision, diplopia, discharge, itchy, watery eyes.  ENT: Denies discharge, congestion, post nasal drip, epistaxis, sore throat, earache, hearing loss, dental pain, tinnitus, vertigo, sinus pain, snoring.  CV: Denies chest pain, palpitations, irregular heartbeat, syncope, dyspnea, diaphoresis, orthopnea, PND, claudication or edema. Respiratory: denies cough, dyspnea, DOE, pleurisy, hoarseness, laryngitis, wheezing.  Gastrointestinal: Denies dysphagia, odynophagia, heartburn, reflux, water brash, abdominal pain or cramps, nausea, vomiting, bloating, diarrhea, constipation, hematemesis, melena, hematochezia  or hemorrhoids. Genitourinary: Denies dysuria, frequency, urgency, nocturia, hesitancy, discharge, hematuria or flank pain. Musculoskeletal: Denies arthralgias, myalgias, stiffness, jt. swelling, pain, limping or strain/sprain.  Skin: Denies pruritus, rash, hives, warts, acne, eczema or change in skin lesion(s). Neuro: No weakness, tremor, incoordination, spasms, paresthesia or pain. Psychiatric: Denies confusion, memory loss or sensory loss. Endo: Denies change in weight, skin or hair change.  Heme/Lymph: No excessive bleeding, bruising or enlarged lymph nodes.  Physical Exam  BP 120/72   Pulse (!) 54   Temp (!) 97.5 F (36.4 C)   Ht '5\' 4"'$  (1.626 m)   Wt 104 lb 3.2 oz (47.3 kg)   SpO2 99%   BMI 17.89 kg/m    Appears  well nourished, well groomed  and in no distress.  Eyes: PERRLA, EOMs, conjunctiva no swelling or erythema. Sinuses: No frontal/maxillary tenderness ENT/Mouth: EAC's clear, TM's nl w/o erythema, bulging. Nares clear w/o erythema, swelling, exudates. Oropharynx clear without erythema or exudates. Oral hygiene is good. Tongue normal, non obstructing. Hearing intact.  Neck: Supple. Thyroid not palpable. Car 2+/2+ without bruits, nodes or JVD. Chest: Respirations nl with BS clear & equal w/o rales, rhonchi, wheezing or stridor.  Cor: Heart sounds normal w/ regular rate and rhythm without sig. murmurs, gallops, clicks or rubs. Peripheral pulses normal and equal  without edema.  Abdomen: Soft & bowel sounds normal. Non-tender w/o guarding, rebound, hernias, masses or organomegaly.  Lymphatics: Unremarkable.  Musculoskeletal: Full ROM all peripheral extremities, joint stability, 5/5 strength and normal gait.  Skin: Warm, dry without exposed rashes or   lesions.  Few ecchymosis of dorsal forearms.  Neuro: Cranial nerves intact, reflexes equal bilaterally. Sensory-motor testing grossly intact. Tendon reflexes grossly intact.  Pysch: Alert & oriented x 3.  Insight and judgement nl & appropriate.  No ideations.  Assessment and Plan:  1. Essential hypertension  - Continue medication, monitor blood pressure at home.  - Continue DASH diet.  Reminder to go to the ER if any CP,  SOB, nausea, dizziness, severe HA, changes vision/speech.    2. Hyperlipidemia associated with type 2 diabetes mellitus ([place])   - Continue diet/meds, exercise,& lifestyle modifications.  - Continue monitor periodic cholesterol/liver & renal functions   3. Type 2 diabetes mellitus with stage 2 chronic kidney  disease, without long-term current use of insulin ([facility])  - Continue diet, exercise  - Lifestyle modifications.  - Monitor appropriate labs    4. Chronic atrial fibrillation ([facility])   5. Vitamin D  deficiency  - continue supplementation  6. Atypical facial pain  - Advised  try gradually increasing her  Gabapentin 600 mg  x 1/2  tid up to 1 whole tablet tid  - Also reccommended adding Tylenol 1,000 mg 3 to 4 x / day   7. Chronic post-traumatic headache, not intractable   8. Thrombophilia ([place])   9. Medication management         Discussed  regular exercise, BP monitoring, weight control to achieve/maintain BMI less than 25 and discussed med and SE's. Recommended labs to assess /monitor clinical status .  I discussed the assessment and treatment plan with the patient. The patient was provided an opportunity to ask questions and all were answered. The patient agreed with the plan and demonstrated an understanding of the instructions.  I provided over 30 minutes of exam, counseling, chart review and  complex critical decision making.        The patient was advised to call back or seek an in-person evaluation if the symptoms worsen or if the condition fails to improve as anticipated.   Kirtland Bouchard, MD

## 2021-11-12 NOTE — Patient Instructions (Addendum)
Due to recent changes in healthcare laws, you may see the results of your imaging and laboratory studies on MyChart before your provider has had a chance to review them.  We understand that in some cases there may be results that are confusing or concerning to you. Not all laboratory results come back in the same time frame and the provider may be waiting for multiple results in order to interpret others.  Please give [place] 48 hours in order for your provider to thoroughly review all the results before contacting the office for clarification of your results.   ++++++++++++++++++++++++++++++++++  Recommend that you take   Acetaminophen  (generic Tylenol) 500 mg   - take 2 tablets = 1,000 mg  4 x /day with Meals & Bedtime    ++++++++++++++++++++++++++++++++++  Also, Recommend that you increase your   Gabapentin 600 mg to 1/2 tablet  - Breakfast   &                                        1 whole tablet  mid day  &                                      1 whole tablet at Bedtime    ++++++++++++++++++++++++++++++++++ Trigeminal Neuralgia  Trigeminal neuralgia is a nerve disorder that causes severe pain on one side of the face. The pain may last from a few seconds to several minutes, but it can happen hundreds of times a day. The pain is usually only on one side of the face. Symptoms may occur for days, weeks, or months and then go away for months or years. The pain may return and be worse than before. What are the causes? This condition may be caused by: Damage or pressure to a nerve in the head that is called the trigeminal nerve. An attack can be triggered by: Talking or chewing. Putting on makeup. Washing, shaving, or touching your face. Brushing your teeth. Blasts of hot or cold air. Primary demyelinating disorders, such as multiple sclerosis. Tumors. What increases the risk? You are more likely to develop this condition if: You are 45-78 years old. You are female. What are the signs  or symptoms? The main symptom of this condition is severe pain in the jaw, lips, eyes, nose, scalp, forehead, and face. How is this diagnosed? This condition is diagnosed with a physical exam. A CT scan or an MRI may be done to rule out other conditions that can cause facial pain. How is this treated? This condition may be treated with: Measures to avoid the things that trigger your symptoms. Prescription medicines such as anticonvulsants. Procedures such as ablation, thermal, or radiation therapy. Cognitive or behavioral therapy. Complementary therapies such as: Gentle, regular exercise or yoga. Meditation. Aromatherapy. Acupuncture. Surgery. This may be done in severe cases if other medical treatment does not provide relief. It may take up to one month for treatment to start relieving the pain. Follow these instructions at home: Managing pain  Learn as much as you can about how to manage your pain. Ask your health care provider if a pain specialist would be helpful. Consider talking with a mental health care provider about how to cope with the pain. Consider joining a pain support group. General instructions Take over-the-counter and prescription  medicines only as told by your health care provider. Avoid the things that trigger your symptoms. It may help to: Chew on the unaffected side of your mouth. Avoid touching your face. Avoid blasts of hot or cold air. Keep all follow-up visits. Where to find more information Facial Pain Association: facepain.org Contact a health care provider if: Your medicine is not helping your symptoms. You have side effects from the medicine used for treatment. You develop new, unexplained symptoms, such as: Double vision. Facial weakness or numbness. Changes in hearing or balance. You feel depressed. Get help right away if: Your pain is severe and is not getting better. You develop suicidal thoughts. If you ever feel like you may hurt yourself  or others, or have thoughts about taking your own life, get help right away. Go to your nearest emergency department or: Call your local emergency services (911 in the [place] Call a suicide crisis helpline, such as the [facility] at 478-762-2516 or 988 in the [place]. This is open 24 hours a day in the [place] Text the Crisis Text Line at 727-880-8868 (in the [place].). Summary Trigeminal neuralgia is a nerve disorder that causes severe pain on one side of the face. The pain may last from a few seconds to several minutes. This condition is caused by damage or pressure to a nerve in the head that is called the trigeminal nerve. Treatment may include avoiding the things that trigger your symptoms, taking medicines, or having procedures or surgery. It may take up to one month for treatment to start relieving the pain. Keep all follow-up visits.  ++++++++++++++++++++++++++++++++++            ++++++++++++++++++++++++++++++++++  Vit D  & Vit C 1,000 mg   are recommended to help protect  against the Covid-19 and other Corona viruses.    Also it's recommended  to take  Zinc 50 mg  to help  protect against the Covid-19   and best place to get  is also on [employer].com  and don't pay more than 6-8 cents /pill !   ===================================== Coronavirus (COVID-19) Are you at risk?  Are you at risk for the Coronavirus (COVID-19)?  To be considered HIGH RISK for Coronavirus (COVID-19), you have to meet the following criteria:  Traveled to [place], [place], [place], [place] or [place]; or in the [place] to [place], [place], [place]  or [place]; and have fever, cough, and shortness of breath within the last 2 weeks of travel OR Been in close contact with a person diagnosed with COVID-19 within the last 2 weeks and have  fever, cough,and shortness of breath  IF YOU DO NOT MEET THESE CRITERIA, YOU ARE CONSIDERED LOW RISK FOR COVID-19.  What to do if you are HIGH  RISK for COVID-19?  If you are having a medical emergency, call 911. Seek medical care right away. Before you go to a doctor's office, urgent care or emergency department,  call ahead and tell them about your recent travel, contact with someone diagnosed with COVID-19   and your symptoms.  You should receive instructions from your physician's office regarding next steps of care.  When you arrive at healthcare provider, tell the healthcare staff immediately you have returned from  visiting [place], [place], [place], [place] or [place]; or traveled in the [place] to [place], [place],  [place] or [place] in the last two weeks or you have been in close contact with a person  diagnosed with  COVID-19 in the last 2 weeks.   Tell the health care staff about your symptoms: fever, cough and shortness of breath. After you have been seen by a medical provider, you will be either: Tested for (COVID-19) and discharged home on quarantine except to seek medical care if  symptoms worsen, and asked to  Stay home and avoid contact with others until you get your results (4-5 days)  Avoid travel on public transportation if possible (such as bus, train, or airplane) or Sent to the Emergency Department by EMS for evaluation, COVID-19 testing  and  possible admission depending on your condition and test results.  What to do if you are LOW RISK for COVID-19?  Reduce your risk of any infection by using the same precautions used for avoiding the common cold or flu:  Wash your hands often with soap and warm water for at least 20 seconds.  If soap and water are not readily available,  use an alcohol-based hand sanitizer with at least 60% alcohol.  If coughing or sneezing, cover your mouth and nose by coughing or sneezing into the elbow areas of your shirt or coat,  into a tissue or into your sleeve (not your hands). Avoid shaking hands with others and consider head nods or verbal greetings only. Avoid  touching your eyes, nose, or mouth with unwashed hands.  Avoid close contact with people who are sick. Avoid places or events with large numbers of people in one location, like concerts or sporting events. Carefully consider travel plans you have or are making. If you are planning any travel outside or inside the [place], visit the CDC's Travelers' Health webpage for the latest health notices. If you have some symptoms but not all symptoms, continue to monitor at home and seek medical attention  if your symptoms worsen. If you are having a medical emergency, call 911.   ++++++++++++++++++++++++++++++++ Recommend Adult Low Dose Aspirin or  coated  Aspirin 81 mg daily  To reduce risk of Colon Cancer 40 %,  Skin Cancer 26 % ,  Melanoma 46%  and  Pancreatic cancer 60% ++++++++++++++++++++++++++++++++ Vitamin D goal  is between 70-100.  Please make sure that you are taking your Vitamin D as directed.  It is very important as a natural anti-inflammatory  helping hair, skin, and nails, as well as reducing stroke and heart attack risk.  It helps your bones and helps with mood. It also decreases numerous cancer risks so please take it as directed.  Low Vit D is associated with a 200-300% higher risk for CANCER  and 200-300% higher risk for HEART   ATTACK  &  STROKE.   .....................................Marland Kitchen It is also associated with higher death rate at younger ages,  autoimmune diseases like Rheumatoid arthritis, Lupus, Multiple Sclerosis.    Also many other serious conditions, like depression, Alzheimer's Dementia, infertility, muscle aches, fatigue, fibromyalgia - just to name a few. ++++++++++++++++++++ Recommend the book "The END of DIETING" by Dr Excell Seltzer  & the book "The END of DIABETES " by Dr Excell Seltzer At [email or web address].[email or web address] - get book & Audio CD's    Being diabetic has a  300% increased risk for heart attack, stroke, cancer, and alzheimer- type vascular dementia. It is very important  that you work harder with diet by avoiding all foods that are white. Avoid white rice (brown & wild rice is OK), white potatoes (sweetpotatoes in moderation is OK), White bread or wheat bread or anything made out  of white flour like bagels, donuts, rolls, buns, biscuits, cakes, pastries, cookies, pizza crust, and pasta (made from white flour & egg whites) - vegetarian pasta or spinach or wheat pasta is OK. Multigrain breads like Arnold's or Pepperidge [employer], or multigrain sandwich thins or flatbreads.  Diet, exercise and weight loss can reverse and cure diabetes in the early stages.  Diet, exercise and weight loss is very important in the control and prevention of complications of diabetes which affects every system in your body, ie. Brain - dementia/stroke, eyes - glaucoma/blindness, heart - heart attack/heart failure, kidneys - dialysis, stomach - gastric paralysis, intestines - malabsorption, nerves - severe painful neuritis, circulation - gangrene & loss of a leg(s), and finally cancer and Alzheimers.    I recommend avoid fried & greasy foods,  sweets/candy, white rice (brown or wild rice or Quinoa is OK), white potatoes (sweet potatoes are OK) - anything made from white flour - bagels, doughnuts, rolls, buns, biscuits,white and wheat breads, pizza crust and traditional pasta made of white flour & egg white(vegetarian pasta or spinach or wheat pasta is OK).  Multi-grain bread is OK - like multi-grain flat bread or sandwich thins. Avoid alcohol in excess. Exercise is also important.    Eat all the vegetables you want - avoid meat, especially red meat and dairy - especially cheese.  Cheese is the most concentrated form of trans-fats which is the worst thing to clog up our arteries. Veggie cheese is OK which can be found in the fresh produce section at [employer] or [employer] or [employer]  +++++++++++++++++++++ DASH Eating Plan  DASH stands for "Dietary Approaches to Stop Hypertension."   The DASH  eating plan is a healthy eating plan that has been shown to reduce high blood pressure (hypertension). Additional health benefits may include reducing the risk of type 2 diabetes mellitus, heart disease, and stroke. The DASH eating plan may also help with weight loss. WHAT DO I NEED TO KNOW ABOUT THE DASH EATING PLAN? For the DASH eating plan, you will follow these general guidelines: Choose foods with a percent daily value for sodium of less than 5% (as listed on the food label). Use salt-free seasonings or herbs instead of table salt or sea salt. Check with your health care provider or pharmacist before using salt substitutes. Eat lower-sodium products, often labeled as "lower sodium" or "no salt added." Eat fresh foods. Eat more vegetables, fruits, and low-fat dairy products. Choose whole grains. Look for the word "whole" as the first word in the ingredient list. Choose fish  Limit sweets, desserts, sugars, and sugary drinks. Choose heart-healthy fats. Eat veggie cheese  Eat more home-cooked food and less restaurant, buffet, and fast food. Limit fried foods. Cook foods using methods other than frying. Limit canned vegetables. If you do use them, rinse them well to decrease the sodium. When eating at a restaurant, ask that your food be prepared with less salt, or no salt if possible.                      WHAT FOODS CAN I EAT? Read Dr Fara Olden Fuhrman's books on The End of Dieting & The End of Diabetes  Grains Whole grain or whole wheat bread. Brown rice. Whole grain or whole wheat pasta. Quinoa, bulgur, and whole grain cereals. Low-sodium cereals. Corn or whole wheat flour tortillas. Whole grain cornbread. Whole grain crackers. Low-sodium crackers.  Vegetables Fresh or frozen vegetables (raw, steamed, roasted, or grilled). Low-sodium or  reduced-sodium tomato and vegetable juices. Low-sodium or reduced-sodium tomato sauce and paste. Low-sodium or reduced-sodium canned vegetables.    Fruits All fresh, canned (in natural juice), or frozen fruits.  Protein Products  All fish and seafood.  Dried beans, peas, or lentils. Unsalted nuts and seeds. Unsalted canned beans.  Dairy Low-fat dairy products, such as skim or 1% milk, 2% or reduced-fat cheeses, low-fat ricotta or cottage cheese, or plain low-fat yogurt. Low-sodium or reduced-sodium cheeses.  Fats and Oils Tub margarines without trans fats. Light or reduced-fat mayonnaise and salad dressings (reduced sodium). Avocado. Safflower, olive, or canola oils. Natural peanut or almond butter.  Other Unsalted popcorn and pretzels. The items listed above may not be a complete list of recommended foods or beverages. Contact your dietitian for more options.  +++++++++++++++  WHAT FOODS ARE NOT RECOMMENDED? Grains/ White flour or wheat flour White bread. White pasta. White rice. Refined cornbread. Bagels and croissants. Crackers that contain trans fat.  Vegetables  Creamed or fried vegetables. Vegetables in a . Regular canned vegetables. Regular canned tomato sauce and paste. Regular tomato and vegetable juices.  Fruits Dried fruits. Canned fruit in light or heavy syrup. Fruit juice.  Meat and Other Protein Products Meat in general - RED meat & White meat.  Fatty cuts of meat. Ribs, chicken wings, all processed meats as bacon, sausage, bologna, salami, fatback, hot dogs, bratwurst and packaged luncheon meats.  Dairy Whole or 2% milk, cream, half-and-half, and cream cheese. Whole-fat or sweetened yogurt. Full-fat cheeses or blue cheese. Non-dairy creamers and whipped toppings. Processed cheese, cheese spreads, or cheese curds.  Condiments Onion and garlic salt, seasoned salt, table salt, and sea salt. Canned and packaged gravies. Worcestershire sauce. Tartar sauce. Barbecue sauce. Teriyaki sauce. Soy sauce, including reduced sodium. Steak sauce. Fish sauce. Oyster sauce. Cocktail sauce. Horseradish. Ketchup and mustard.  Meat flavorings and tenderizers. Bouillon cubes. Hot sauce. Tabasco sauce. Marinades. Taco seasonings. Relishes.  Fats and Oils Butter, stick margarine, lard, shortening and bacon fat. Coconut, palm kernel, or palm oils. Regular salad dressings.  Pickles and olives. Salted popcorn and pretzels.  The items listed above may not be a complete list of foods and beverages to avoid.

## 2021-11-13 ENCOUNTER — Ambulatory Visit (INDEPENDENT_AMBULATORY_CARE_PROVIDER_SITE_OTHER): Payer: PPO | Admitting: Internal Medicine

## 2021-11-13 ENCOUNTER — Encounter: Payer: Self-pay | Admitting: Internal Medicine

## 2021-11-13 VITALS — BP 120/72 | HR 54 | Temp 97.5°F | Ht 64.0 in | Wt 104.2 lb

## 2021-11-13 DIAGNOSIS — D6859 Other primary thrombophilia: Secondary | ICD-10-CM

## 2021-11-13 DIAGNOSIS — I482 Chronic atrial fibrillation, unspecified: Secondary | ICD-10-CM | POA: Diagnosis not present

## 2021-11-13 DIAGNOSIS — Z79899 Other long term (current) drug therapy: Secondary | ICD-10-CM | POA: Diagnosis not present

## 2021-11-13 DIAGNOSIS — E1122 Type 2 diabetes mellitus with diabetic chronic kidney disease: Secondary | ICD-10-CM | POA: Diagnosis not present

## 2021-11-13 DIAGNOSIS — I10 Essential (primary) hypertension: Secondary | ICD-10-CM

## 2021-11-13 DIAGNOSIS — G501 Atypical facial pain: Secondary | ICD-10-CM | POA: Diagnosis not present

## 2021-11-13 DIAGNOSIS — E1169 Type 2 diabetes mellitus with other specified complication: Secondary | ICD-10-CM

## 2021-11-13 DIAGNOSIS — N182 Chronic kidney disease, stage 2 (mild): Secondary | ICD-10-CM | POA: Diagnosis not present

## 2021-11-13 DIAGNOSIS — E785 Hyperlipidemia, unspecified: Secondary | ICD-10-CM | POA: Diagnosis not present

## 2021-11-13 DIAGNOSIS — E559 Vitamin D deficiency, unspecified: Secondary | ICD-10-CM

## 2021-11-13 DIAGNOSIS — G44329 Chronic post-traumatic headache, not intractable: Secondary | ICD-10-CM | POA: Diagnosis not present

## 2021-11-22 DIAGNOSIS — F419 Anxiety disorder, unspecified: Secondary | ICD-10-CM | POA: Diagnosis not present

## 2021-11-22 DIAGNOSIS — E782 Mixed hyperlipidemia: Secondary | ICD-10-CM | POA: Diagnosis not present

## 2021-11-22 DIAGNOSIS — I10 Essential (primary) hypertension: Secondary | ICD-10-CM | POA: Diagnosis not present

## 2021-11-22 DIAGNOSIS — E441 Mild protein-calorie malnutrition: Secondary | ICD-10-CM | POA: Diagnosis not present

## 2021-11-30 ENCOUNTER — Other Ambulatory Visit: Payer: Self-pay | Admitting: Nurse Practitioner

## 2021-11-30 DIAGNOSIS — F419 Anxiety disorder, unspecified: Secondary | ICD-10-CM

## 2021-12-18 ENCOUNTER — Ambulatory Visit (INDEPENDENT_AMBULATORY_CARE_PROVIDER_SITE_OTHER): Payer: PPO | Admitting: Internal Medicine

## 2021-12-18 ENCOUNTER — Encounter: Payer: Self-pay | Admitting: Internal Medicine

## 2021-12-18 VITALS — BP 116/70 | HR 60 | Temp 97.9°F | Resp 16 | Ht 64.0 in | Wt 106.0 lb

## 2021-12-18 DIAGNOSIS — G501 Atypical facial pain: Secondary | ICD-10-CM

## 2021-12-20 IMAGING — MG DIGITAL SCREENING BILAT W/ TOMO W/ CAD
8 series · 9 of 24 positions shown · non-contrast
Comparison: Previous exam(s).

CLINICAL DATA: Screening.

EXAM:
DIGITAL SCREENING BILATERAL MAMMOGRAM WITH TOMO AND CAD

[L CC synth-2D]
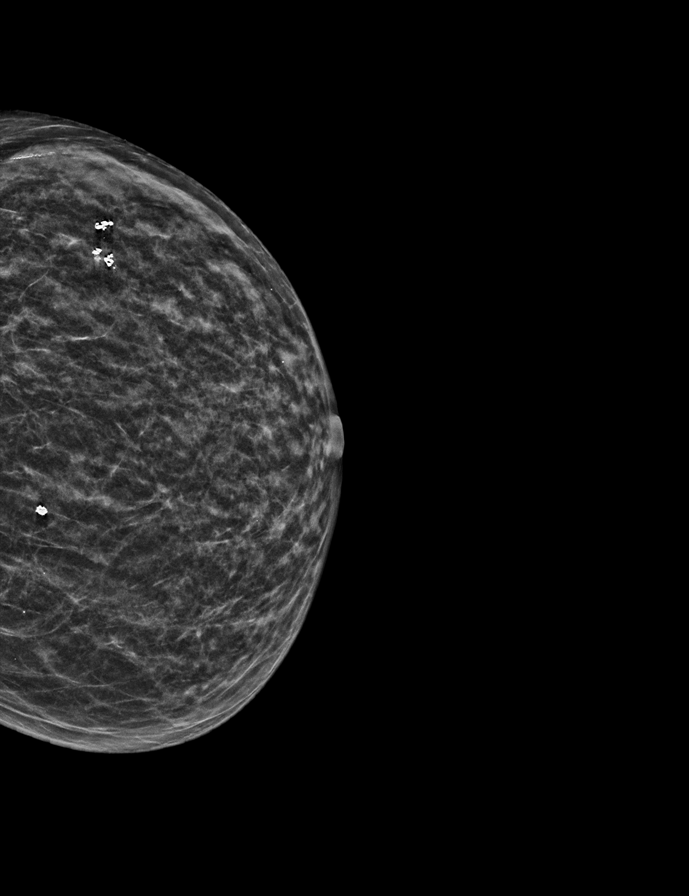

[L MLO synth-2D]
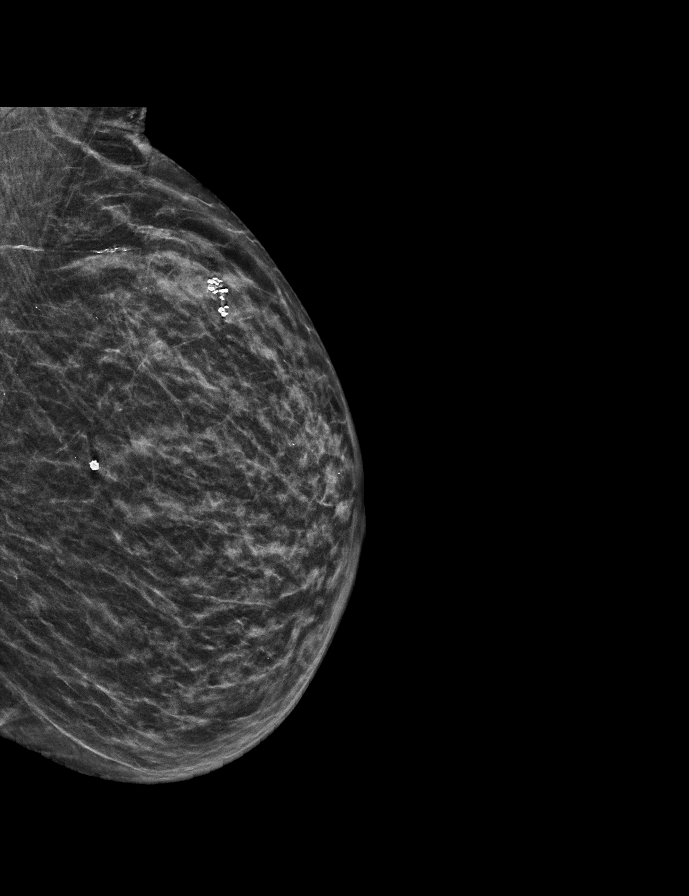

[R CC synth-2D]
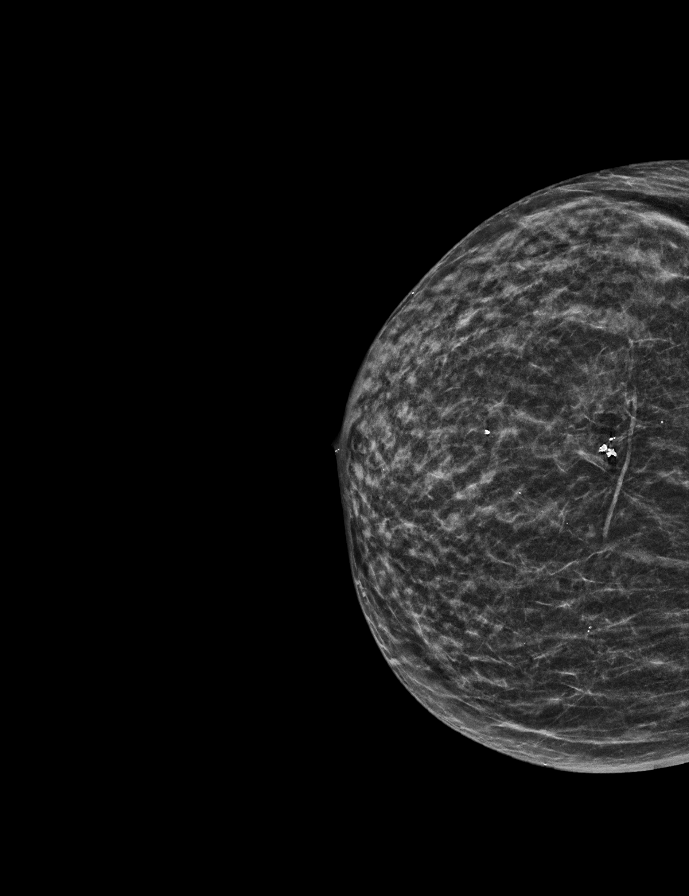

[R MLO synth-2D]
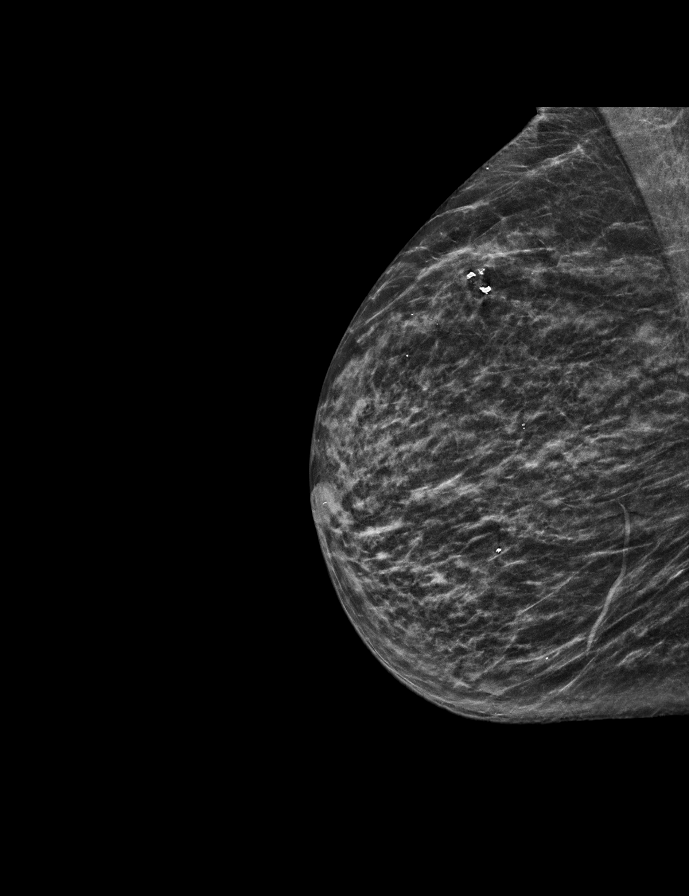

[L CC tomo · 2 of 36 frames shown]
[frame 12/36]
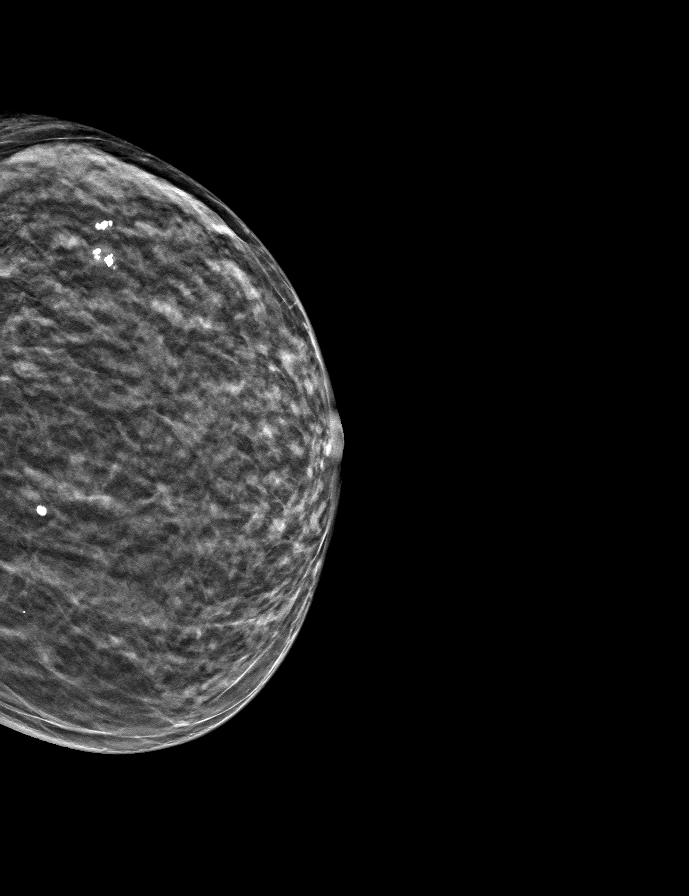
[frame 19/36]
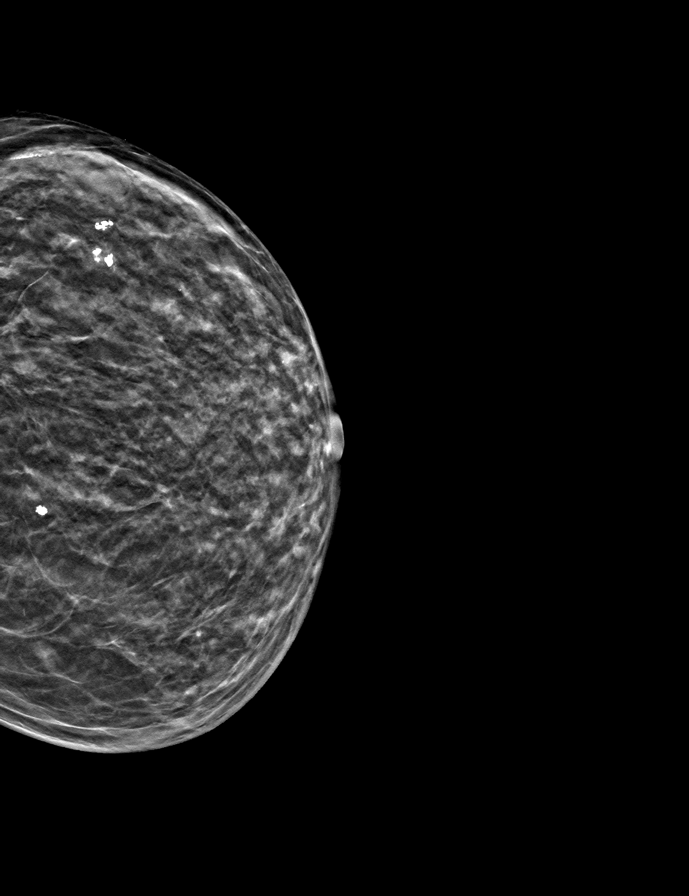

[R CC tomo · tomo slice 19/38.0]
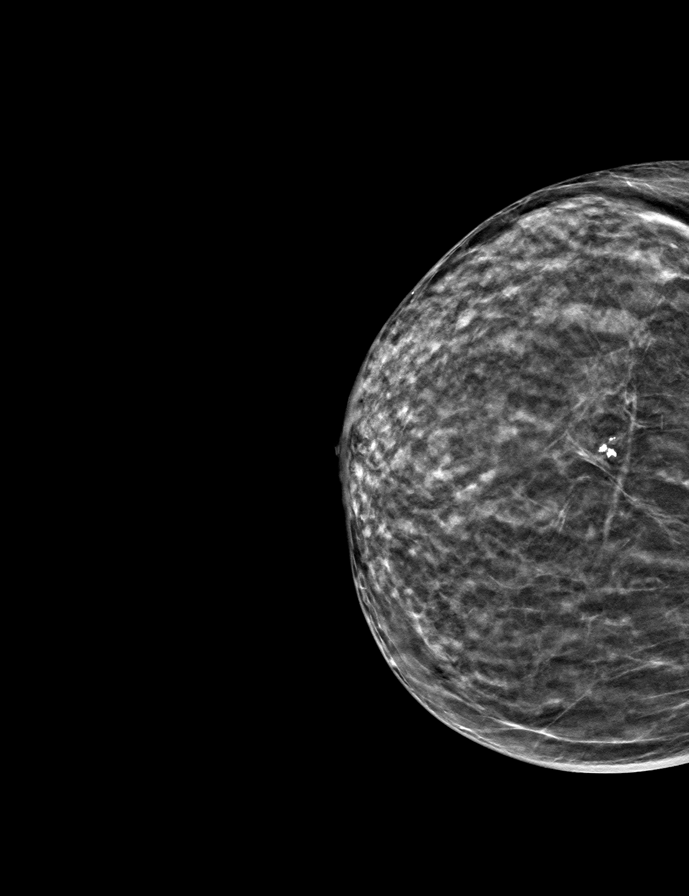

[R MLO tomo · tomo slice 22/43.0]
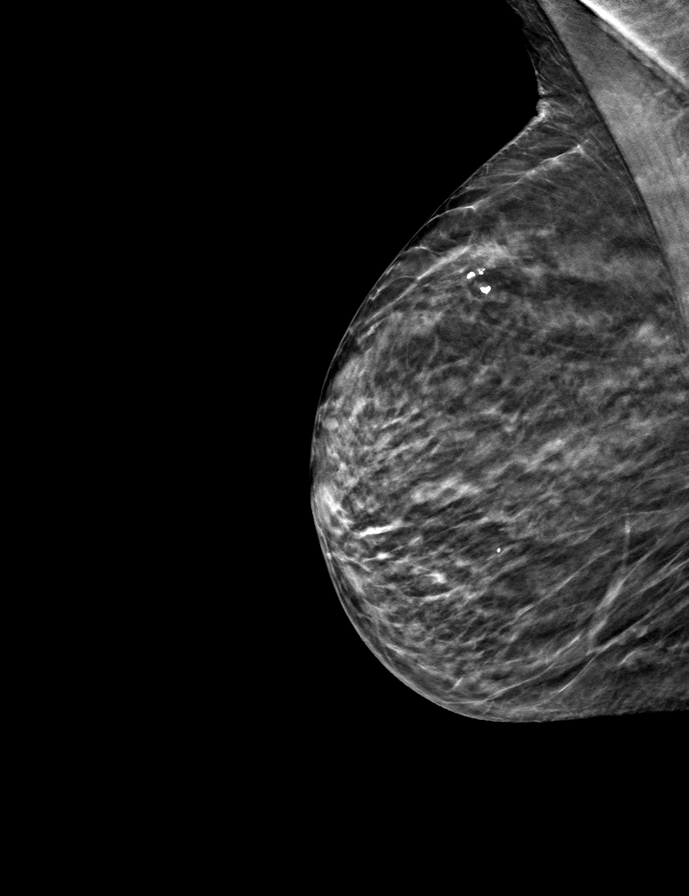

[L MLO tomo · tomo slice 21/41.0]
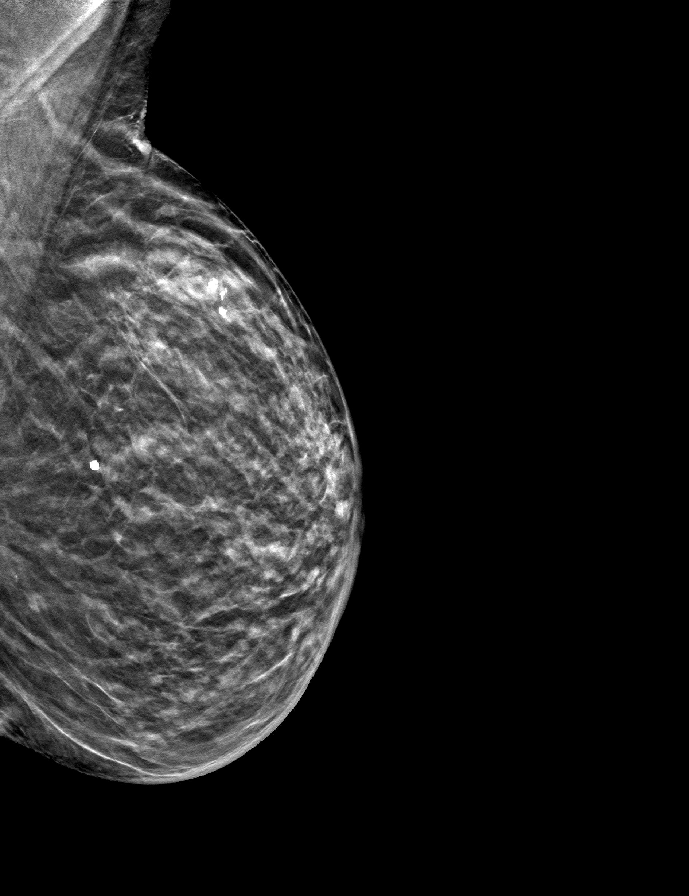

[9 of 24 positions shown; findings below may reference images not displayed]

ACR Breast Density Category c: The breast tissue is heterogeneously
dense, which may obscure small masses.
FINDINGS: There are no findings suspicious for malignancy. Images were
processed with CAD.
IMPRESSION: No mammographic evidence of malignancy. A result letter of this
screening mammogram will be mailed directly to the patient.

RECOMMENDATION:
Screening mammogram in one year. (Code:[id number]-U-LHB)

BI-RADS CATEGORY  1: Negative.

## 2021-12-27 ENCOUNTER — Other Ambulatory Visit: Payer: Self-pay | Admitting: Nurse Practitioner

## 2021-12-27 DIAGNOSIS — K589 Irritable bowel syndrome without diarrhea: Secondary | ICD-10-CM

## 2021-12-30 DIAGNOSIS — M67432 Ganglion, left wrist: Secondary | ICD-10-CM | POA: Diagnosis not present

## 2022-01-10 DIAGNOSIS — H524 Presbyopia: Secondary | ICD-10-CM | POA: Diagnosis not present

## 2022-01-10 DIAGNOSIS — H5203 Hypermetropia, bilateral: Secondary | ICD-10-CM | POA: Diagnosis not present

## 2022-01-10 DIAGNOSIS — H52221 Regular astigmatism, right eye: Secondary | ICD-10-CM | POA: Diagnosis not present

## 2022-01-10 DIAGNOSIS — H53143 Visual discomfort, bilateral: Secondary | ICD-10-CM | POA: Diagnosis not present

## 2022-01-11 ENCOUNTER — Other Ambulatory Visit: Payer: Self-pay | Admitting: Adult Health

## 2022-01-14 ENCOUNTER — Other Ambulatory Visit: Payer: Self-pay | Admitting: Internal Medicine

## 2022-01-14 DIAGNOSIS — I482 Chronic atrial fibrillation, unspecified: Secondary | ICD-10-CM

## 2022-01-14 MED ORDER — APIXABAN 2.5 MG PO TABS: ORAL_TABLET | ORAL | 3 refills | Status: DC

## 2022-01-23 ENCOUNTER — Other Ambulatory Visit: Payer: Self-pay | Admitting: Internal Medicine

## 2022-01-23 ENCOUNTER — Telehealth: Payer: Self-pay

## 2022-01-23 DIAGNOSIS — I482 Chronic atrial fibrillation, unspecified: Secondary | ICD-10-CM

## 2022-01-23 MED ORDER — APIXABAN 2.5 MG PO TABS: ORAL_TABLET | ORAL | 3 refills | Status: DC

## 2022-01-23 NOTE — Telephone Encounter (Signed)
The patient's [facility]bra [facility] paperwork for Eliquis was filled out including the patient's sections, prescription and faxed today to [phone]. The most recent paperwork will be kept in the paper chart per Dr. [name].

## 2022-01-26 ENCOUNTER — Telehealth: Payer: Self-pay

## 2022-01-26 NOTE — Telephone Encounter (Signed)
Received denial letter from [employer] #[id number] due to "Documentation of 3% out-of-pocket prescription expenses, based on household adjusted gross income, not met." Letter sent to patient from foundation.

## 2022-01-31 ENCOUNTER — Telehealth: Payer: Self-pay

## 2022-01-31 NOTE — Telephone Encounter (Signed)
LM-01/31/22-Called pt.to confirm FOC for 8/15. Unable to reach pt. [place] X1.  Total time spent: 2 min

## 2022-02-01 NOTE — Telephone Encounter (Signed)
LM-02/01/22-Called pt. To confirm CP FOC visit on 8/15 between 3-5PM. A female picked up the phone and answered, and said "Hi, dumb dumb." Asked to speak to Veryl Speak got no response for several minutes. Then a female responded stating, "Someone is on the phone for you." After several minutes trying to reach patient. The female stated "That person doesn't live here." Disconnected call. Called pts. Other lisited contact number and was unable to reach, VM box full.   Total time spent: 14 min.

## 2022-02-02 NOTE — Telephone Encounter (Signed)
LM-02/02/22-Called pt. To confirm CP [place] visit for 8/15 between 3-5pm. Spoke to pts. Sister Olegario Shearer who informed me pt. Was not available. Left message to sister Olegario Shearer to have pt. Call me in regards to her visit scheduled for 8/15 w/ CP. Pt. Sister will inform pt. To return my call at [phone].   Total time spent: 5 min.

## 2022-02-05 ENCOUNTER — Other Ambulatory Visit: Payer: Self-pay | Admitting: Internal Medicine

## 2022-02-05 DIAGNOSIS — F419 Anxiety disorder, unspecified: Secondary | ICD-10-CM

## 2022-02-05 DIAGNOSIS — K2100 Gastro-esophageal reflux disease with esophagitis, without bleeding: Secondary | ICD-10-CM

## 2022-02-05 MED ORDER — CLONAZEPAM 0.5 MG PO TABS: ORAL_TABLET | ORAL | 0 refills | Status: DC

## 2022-02-05 NOTE — Telephone Encounter (Signed)
LM-02/05/22-Called pt. To confirm [facility] for 8/15 between 3-5PM. Pt. Answered phone, but did not speak to me. Stayed on the line and introduced myself and explained that I was calling to confirm her visit w/ CP on 8/15 between 3-5PM. Pt. Was talking to someone who I assume was with her, but never acknowledged me after speaking several times so I disconnected the call.  Total time spent: 12 min.

## 2022-02-06 ENCOUNTER — Telehealth: Payer: PPO | Admitting: Pharmacy Technician

## 2022-02-13 ENCOUNTER — Ambulatory Visit: Payer: PPO | Admitting: Pharmacy Technician

## 2022-02-13 DIAGNOSIS — Z79899 Other long term (current) drug therapy: Secondary | ICD-10-CM

## 2022-02-13 NOTE — Progress Notes (Signed)
Fluids: Drinks 3-4 bottles of water per day  Constipation: None noted. Bowels move daily per patient.   Appetite: Eating 2-3 meals per day  Falls; No falls this year  Med adherence: Uses pill box and sister Olegario Shearer helps out some  Not a good candidate for phone calls. Med rec complete  CPP Televisit: 5mn

## 2022-02-22 DIAGNOSIS — I10 Essential (primary) hypertension: Secondary | ICD-10-CM | POA: Diagnosis not present

## 2022-02-22 DIAGNOSIS — F419 Anxiety disorder, unspecified: Secondary | ICD-10-CM | POA: Diagnosis not present

## 2022-02-22 DIAGNOSIS — E782 Mixed hyperlipidemia: Secondary | ICD-10-CM | POA: Diagnosis not present

## 2022-02-22 DIAGNOSIS — E441 Mild protein-calorie malnutrition: Secondary | ICD-10-CM | POA: Diagnosis not present

## 2022-02-23 ENCOUNTER — Ambulatory Visit (INDEPENDENT_AMBULATORY_CARE_PROVIDER_SITE_OTHER): Payer: PPO | Admitting: Nurse Practitioner

## 2022-02-23 ENCOUNTER — Encounter: Payer: Self-pay | Admitting: Nurse Practitioner

## 2022-02-23 VITALS — BP 122/76 | HR 72 | Temp 97.9°F | Resp 17 | Ht 64.0 in | Wt 105.6 lb

## 2022-02-23 DIAGNOSIS — I771 Stricture of artery: Secondary | ICD-10-CM

## 2022-02-23 DIAGNOSIS — J439 Emphysema, unspecified: Secondary | ICD-10-CM

## 2022-02-23 DIAGNOSIS — E441 Mild protein-calorie malnutrition: Secondary | ICD-10-CM | POA: Diagnosis not present

## 2022-02-23 DIAGNOSIS — I10 Essential (primary) hypertension: Secondary | ICD-10-CM

## 2022-02-23 DIAGNOSIS — E538 Deficiency of other specified B group vitamins: Secondary | ICD-10-CM | POA: Diagnosis not present

## 2022-02-23 DIAGNOSIS — G501 Atypical facial pain: Secondary | ICD-10-CM

## 2022-02-23 DIAGNOSIS — N280 Ischemia and infarction of kidney: Secondary | ICD-10-CM | POA: Diagnosis not present

## 2022-02-23 DIAGNOSIS — Z79899 Other long term (current) drug therapy: Secondary | ICD-10-CM

## 2022-02-23 DIAGNOSIS — E782 Mixed hyperlipidemia: Secondary | ICD-10-CM

## 2022-02-23 DIAGNOSIS — R300 Dysuria: Secondary | ICD-10-CM | POA: Diagnosis not present

## 2022-02-23 DIAGNOSIS — K219 Gastro-esophageal reflux disease without esophagitis: Secondary | ICD-10-CM

## 2022-02-23 DIAGNOSIS — E559 Vitamin D deficiency, unspecified: Secondary | ICD-10-CM | POA: Diagnosis not present

## 2022-02-23 DIAGNOSIS — I4891 Unspecified atrial fibrillation: Secondary | ICD-10-CM

## 2022-02-23 DIAGNOSIS — R7303 Prediabetes: Secondary | ICD-10-CM

## 2022-02-23 DIAGNOSIS — G44329 Chronic post-traumatic headache, not intractable: Secondary | ICD-10-CM

## 2022-02-23 DIAGNOSIS — M810 Age-related osteoporosis without current pathological fracture: Secondary | ICD-10-CM

## 2022-02-23 DIAGNOSIS — I700 Atherosclerosis of aorta: Secondary | ICD-10-CM

## 2022-02-23 DIAGNOSIS — K760 Fatty (change of) liver, not elsewhere classified: Secondary | ICD-10-CM

## 2022-02-23 DIAGNOSIS — I5032 Chronic diastolic (congestive) heart failure: Secondary | ICD-10-CM | POA: Diagnosis not present

## 2022-02-23 DIAGNOSIS — Z8673 Personal history of transient ischemic attack (TIA), and cerebral infarction without residual deficits: Secondary | ICD-10-CM | POA: Diagnosis not present

## 2022-02-23 NOTE — Patient Instructions (Signed)

## 2022-02-23 NOTE — Progress Notes (Unsigned)
FOLLOW UP   Assessment:   Tortuous aorta (HCC) Control blood pressure, cholesterol, glucose, increase exercise.   Renal infarct ([place]) Continue on elliquis  Atrial fibrillation with controlled ventricular response (HCC)/ thombophilia ([place]) Continue follow up with Cardiology Rate Controlled on Eliquis No recent fall/injury.  Aortic atherosclerosis ([place]) - per CT 05/2020 Discussed DASH (Dietary Approaches to Stop Hypertension) DASH diet is lower in sodium than a typical American diet. Cut back on foods that are high in saturated fat, cholesterol, and trans fats. Eat more whole-grain foods, fish, poultry, and nuts Remain active and exercise as tolerated daily.  Monitor BP at home-Call if greater than 130/80.  Check and monitor CMP/CBC  Chronic diastolic congestive heart failure (HCC) Monitor, no symptoms of fluid overload at this time, patient having weight loss, no swelling, no PND, orthopenia  Pulmonary emphysema, unspecified emphysema type ([place]) Per CXR; likely age related; denies sx; monitor  Mild malnutrition ([place]) Discussed intake of protein and high caloric foods  Essential hypertension Discussed DASH (Dietary Approaches to Stop Hypertension) DASH diet is lower in sodium than a typical American diet. Cut back on foods that are high in saturated fat, cholesterol, and trans fats. Eat more whole-grain foods, fish, poultry, and nuts Remain active and exercise as tolerated daily.  Monitor BP at home-Call if greater than 130/80.  Check and monitor CMP/CBC   Coronary atherosclerosis due to lipid rich plaque Control blood pressure, cholesterol, glucose, increase exercise.   Vitamin D deficiency Continue supplement Monitor levels  Medication management All medications discussed and reviewed in full. All questions and concerns regarding medications addressed.    Mixed hyperlipidemia Discussed lifestyle modifications. Recommended diet heavy in fruits and veggies,  omega 3's. Decrease consumption of animal meats, cheeses, and dairy products. Remain active and exercise as tolerated. Continue to monitor. Check and monitor lipids/TSH   History of embolic stroke Continue eliquis Control blood pressure, cholesterol, glucose, increase exercise.   Chronic post-traumatic headache, not intractable Continue klonopin; limit use, continue Gabapentin PRN Stay well hydrated. Continue to monitor  Atypical facial pain Continue Gabapentin   Hepatic steatosis Check labs, avoid tylenol, alcohol   Gastroesophageal reflux disease, unspecified whether esophagitis present Continue Prevacid PRN. No suspected reflux complications ([place]/stricture). Lifestyle modification:  wt loss, avoid meals 2-3h before bedtime. Consider eliminating food triggers:  chocolate, caffeine, EtOH, acid/spicy food.  Osteoporosis, unspecified osteoporosis type, unspecified pathological fracture presence  Pursue a combination of weight-bearing exercises and strength training. Advised on fall prevention measures including proper lighting in all rooms, removal of area rugs and floor clutter, use of walking devices as deemed appropriate, avoidance of uneven walking surfaces. Smoking cessation and moderate alcohol consumption if applicable Consume 500 to 1000 IU of vitamin D daily with a goal vitamin D serum value of 30 ng/mL or higher. Aim for 1000 to 1200 mg of elemental calcium daily through supplements and/or dietary sources.   Abnormal glucose/ Prediabetes Education: Reviewed 'ABCs' of diabetes management  Discussed goals to be met and/or maintained include A1C (<7) Blood pressure (<130/80) Cholesterol (LDL <70) Continue Eye Exam yearly  Continue Dental Exam Q6 mo Discussed dietary recommendations Discussed Physical Activity recommendations Foot exam UTD Check and monitor A1C  B12 def Continue supplement Monitor levels  Dysuria UA with culture pending. Stay well  hydrated.  Orders Placed This Encounter  Procedures   Urine Culture   MICROSCOPIC MESSAGE   CBC with Differential/Platelet   COMPLETE METABOLIC PANEL WITH GFR   Magnesium   Vitamin B12   Lipid  panel   Hemoglobin A1c   Urinalysis, Routine w reflex microscopic    Over 20 minutes of exam, counseling, chart review and critical decision making was performed Future Appointments  Date Time Provider [place]  07/02/2022 10:00 AM [name], NP [facility] None  10/22/2022 10:00 AM [name], NP [facility] None       Subjective:  Debra White is a 88 y.o. female who presents for  3 Month follow up.   She continues to live with her sister (does still have her home close by), daughter and nephew still living and remain supportive.  She complains of burning with urination.  Denies fatigue, fever, nausea, abdominal pain.  She reports GERD is well controlled by omeprazole 20 mg daily. Uses bentyl occasionally for cramping and diarrhea.   She has residual face pain related to fall and zygomatic fracture with hardware implanted, manages pain with gabapentin 600 mg TID. She follows with Dr. Berenice Primas as needed.   BMI is Body mass index is 18.13 kg/m., she is working on diet, minimal intentional exercise was very limited as active . Uses remeron intermittently to help with appetite and maintain weight.  Wt Readings from Last 3 Encounters:  02/23/22 105 lb 9.6 oz (47.9 kg)  12/18/21 106 lb (48.1 kg)  11/13/21 104 lb 3.2 oz (47.3 kg)   She had splenic and renal infarcts in 2018, history of CVA and on Eliquis 2.5 mg BID for Afib.  CAD, diastolic CHF, Echo 7253 EF 55-60% with severe LAE. Declines TEE and loop recorder, follows with cardiology Dr. Oval Linsey. She also has aortic atherosclerosis per CT 05/2020.    Her blood pressure has not been checking BP at home, today their BP is BP: 122/76 BP Readings from Last 3 Encounters:  02/23/22 122/76  12/18/21 116/70  11/13/21  120/72    She does not workout. She denies chest pain, shortness of breath, dizziness.   She is on cholesterol medication (lovastatin 20 mg daily) and denies myalgias at the current dose, had myalgias with higher doses. Her cholesterol is not at goal. The cholesterol last visit was:   Lab Results  Component Value Date   CHOL 170 02/23/2022   HDL 83 02/23/2022   LDLCALC 65 02/23/2022   TRIG 136 02/23/2022   CHOLHDL 2.0 02/23/2022   She has prediabetes.  has not been working on diet and exercise for prediabetes, and denies hyperglycemia, hypoglycemia , nausea, polydipsia, polyuria and visual disturbances. Last A1C in the office was:  Lab Results  Component Value Date   HGBA1C 5.6 02/23/2022   Last GFR: Lab Results  Component Value Date   EGFR 69 02/23/2022   Patient is on Vitamin D supplement.   Lab Results  Component Value Date   VD25OH 74 12/15/2019   She has been on b12 supplemnt -  Lab Results  Component Value Date   VITAMINB12 1,705 (H) 02/23/2022     Medication Review:   Current Outpatient Medications (Cardiovascular):    atenolol (TENORMIN) 100 MG tablet, Take  1 tablet  Daily  for B                                           /                            TAKE  BY                   MOUTH   lovastatin (MEVACOR) 20 MG tablet, TAKE 1 TABLET BY MOUTH EACH NIGHT AT BEDTIME FOR CHOLESTEROL   olmesartan (BENICAR) 40 MG tablet, TAKE 1 TABLET BY MOUTH DAILY FOR BLOOD PRESSURE   amLODipine (NORVASC) 2.5 MG tablet, Take 1 tablet (2.5 mg total) by mouth daily.   Current Outpatient Medications (Analgesics):    acetaminophen (TYLENOL) 500 MG tablet, Take 1,000 mg by mouth every 8 (eight) hours as needed for headache.   Current Outpatient Medications (Hematological):    apixaban (ELIQUIS) 2.5 MG TABS tablet, Take  1 tablet  2 x /day (every 12 hours)  to Prevent Blood Clots   Cyanocobalamin (VITAMIN B 12 PO), Take 1 tablet by mouth daily.  Current  Outpatient Medications (Other):    clonazePAM (KLONOPIN) 0.5 MG tablet, Take 1/2 - 1 tablet   2 - 3 x /day   ONLY   if needed for Anxiety Attack &  limit to 5 days /week to avoid Addiction & Dementia   conjugated estrogens (PREMARIN) vaginal cream, Place 1 Applicatorful vaginally See admin instructions. Apply one applicatorful vaginally once a week   diclofenac Sodium (VOLTAREN) 1 % GEL, APPLY 2 GRAMS TOPICALLY 3-4 TIMES DAILY AS NEEDED FOR PAIN   dicyclomine (BENTYL) 20 MG tablet, TAKE 1 TABLET BY MOUTH FOUR TIMES DAILY WITH MEALS AND BEDTIME FOR CRAMPING NAUSEA OR BLOATING   gabapentin (NEURONTIN) 600 MG tablet, TAKE 1/2 TO 1 TABLET BY MOUTH 3 TIMES DAILY FOR FACIAL PAIN   omeprazole (PRILOSEC) 40 MG capsule, Take  1 capsule Daily to Prevent Heartburn & Indigestion                           /                                        TAKE                           BY                       MOUTH   polyvinyl alcohol (LIQUIFILM TEARS) 1.4 % ophthalmic solution, Place 1 drop into both eyes 2 (two) times daily as needed for dry eyes.   Vaginal Lubricant (REPLENS) GEL, Place vaginally daily.   Vitamin D-Vitamin K (VITAMIN K2-VITAMIN D3 PO), Take by mouth. 90 mcg of K2, 5,000 units of D3   Allergies  Allergen Reactions   Latex Hives    TONGUE HAD BLISTERS WHEN HAD DENTAL SURGERY   Amoxicillin Nausea And Vomiting   Aspirin Other (See Comments)    Gi upset   Barbiturates Other (See Comments)    Unknown reaction   Celebrex [Celecoxib] Other (See Comments)    Unknown reaction   Codeine Itching   Evista [Raloxifene] Other (See Comments)    Unknown reaction   Lipitor [Atorvastatin] Other (See Comments)    Unknown reaction   Morphine And Related Other (See Comments)    Unknown reaction   Oruvail [Ketoprofen] Other (See Comments)    Unknown reaction   Pantoprazole Diarrhea   Paraffin Other (See Comments)    Unknown reaction   Prochlorperazine Other (See Comments)    Uncontrolled shaking  Proloprim [Trimethoprim] Other (See Comments)    Unknown reaction   Venlafaxine Nausea Only   Vibra-Tab [Doxycycline] Other (See Comments)    Unknown reaction   Vioxx [Rofecoxib] Other (See Comments)    edema   Betadine [Povidone Iodine] Rash   Caffeine Palpitations    Current Problems (verified) Patient Active Problem List   Diagnosis Date Noted   B12 deficiency 10/20/2021   Other abnormal glucose 10/20/2021   Thrombophilia ([place]) 10/20/2020   Epigastric discomfort 11/19/2019   Anxiety 28/41/3244   Diastolic CHF ([place]) 06/27/7251   History of embolic stroke 66/44/0347   Atrial fibrillation with controlled ventricular response ([place]) 03/12/2019   Coronary atherosclerosis due to lipid rich plaque 03/26/2018   Renal infarct ([place]) 03/22/2018   Hyponatremia 03/22/2018   Chronic hyperkalemia 03/22/2018   Hepatic steatosis 11/17/2017   Aortic atherosclerosis ([place]) by Abd CT scan (05/2020)  08/08/2017   COPD (chronic obstructive pulmonary disease) with emphysema ([place]) 12/21/2015   Tortuous aorta ([place]) 12/21/2015   Osteoporosis 12/21/2015   Mild malnutrition ([place]) 05/11/2015   Medication management 01/27/2014   Atypical facial pain 10/08/2013   Vitamin D deficiency 07/08/2013   Hypertension    Hyperlipidemia    GERD    Headache, post-traumatic, chronic 09/20/2011    Screening Tests Immunization History  Administered Date(s) Administered   Influenza, High Dose Seasonal PF 03/25/2014, 04/05/2016, 06/02/2018, 03/12/2019, 03/23/2020, 05/02/2021   Influenza-Unspecified 03/09/2013, 03/11/2015   PFIZER(Purple Top)SARS-COV-2 Vaccination 06/01/2020, 06/22/2020   Pneumococcal Conjugate-13 12/21/2015   Pneumococcal Polysaccharide-23 06/25/2002   Td 06/26/2003   Health Maintenance  Topic Date Due   Zoster Vaccines- Shingrix (1 of 2) Never done   COVID-19 Vaccine (3 - Pfizer risk series) 07/20/2020   INFLUENZA VACCINE  01/23/2022   TETANUS/TDAP  10/21/2022 (Originally 06/25/2013)    Pneumonia Vaccine 56+ Years old  Completed   DEXA SCAN  Completed   HPV VACCINES  Aged Out   COLONOSCOPY (Pts 45-42yrs Insurance coverage will need to be confirmed)  Discontinued    Patient Care Team: Unk Pinto, MD as PCP - General (Internal Medicine) Sanda Klein, MD as PCP - Cardiology (Cardiology) Darleen Crocker, MD as Consulting Physician (Ophthalmology) Ladene Artist, MD as Consulting Physician (Gastroenterology) Cameron Sprang, MD as Consulting Physician (Neurology) Jodi Marble, MD as Consulting Physician (Otolaryngology) Rush Landmark, [facility] as Pharmacist (Pharmacist)  SURGICAL HISTORY She  has a past surgical history that includes Abdominal hysterectomy; Hand surgery; Shoulder arthroscopy w/ rotator cuff repair; Cervical disc surgery; Back surgery; ORIF tripod fracture (07/13/2011); Colonoscopy; Facial fracture surgery; Back surgery; Ptosis repair (Bilateral, 11/22/2016); and Breast excisional biopsy. FAMILY HISTORY Her family history includes Breast cancer (age of onset: 59) in her sister; Cancer in her sister, sister, and sister; Colon cancer in her sister; Heart attack in her father; Heart disease in her father, mother, and sister; Hyperlipidemia in her daughter; Hypertension in her daughter. SOCIAL HISTORY She  reports that she has never smoked. She has never been exposed to tobacco smoke. She has never used smokeless tobacco. She reports that she does not drink alcohol and does not use drugs. Review of Systems  Constitutional:  Negative for malaise/fatigue and weight loss.  HENT:  Negative for hearing loss and tinnitus.   Eyes:  Negative for blurred vision and double vision.  Respiratory:  Negative for cough, sputum production, shortness of breath and wheezing.        Chronic facial pain, well controlled  Cardiovascular:  Negative for chest pain, palpitations, orthopnea, claudication, leg  swelling and PND.  Gastrointestinal:  Negative for abdominal pain, blood in  stool, constipation, diarrhea (intermittent), heartburn, melena, nausea and vomiting.  Genitourinary: Negative.   Musculoskeletal:  Negative for falls, joint pain and myalgias.  Skin:  Negative for rash.  Neurological:  Negative for dizziness, tingling, sensory change, weakness and headaches.  Endo/Heme/Allergies:  Negative for polydipsia.  Psychiatric/Behavioral: Negative.  Negative for depression, memory loss, substance abuse and suicidal ideas. The patient is not nervous/anxious and does not have insomnia.   All other systems reviewed and are negative.    Objective:     Today's Vitals   02/23/22 1019  BP: 122/76  Pulse: 72  Resp: 17  Temp: 97.9 F (36.6 C)  SpO2: 95%  Weight: 105 lb 9.6 oz (47.9 kg)  Height: $Remove'5\' 4"'NilHjPY$  (1.626 m)   Body mass index is 18.13 kg/m.  General appearance: alert, no distress, WD/WN, thin elderly female HEENT: normocephalic, sclerae anicteric, TMs pearly right, left cerumen impaction, nares patent, no discharge or erythema, pharynx normal. Mildly HOH, hearing aids.  Oral cavity: MMM, no lesions Neck: supple, no lymphadenopathy, no thyromegaly, no masses Heart: RRR, normal S1, S2, no murmurs Lungs: CTA bilaterally, no wheezes, rhonchi, or rales Abdomen: +bs, soft, diffuse tenderness, non distended, no masses, no hepatomegaly, no splenomegaly Musculoskeletal: nontender, no swelling, no obvious deformity Extremities: no edema, no cyanosis, no clubbing Pulses: 2+ symmetric, upper and lower extremities, normal cap refill Neurological: alert, oriented x 3, CN2-12 intact, strength normal upper extremities and lower extremities, sensation normal throughout, DTRs 2+ throughout, no cerebellar signs, gait normal     Gurtha Picker, NP   06/16/2019

## 2022-02-24 LAB — URINALYSIS, ROUTINE W REFLEX MICROSCOPIC
Bacteria, UA: NONE SEEN /HPF
Bilirubin Urine: NEGATIVE
Glucose, UA: NEGATIVE
Hgb urine dipstick: NEGATIVE
Hyaline Cast: NONE SEEN /LPF
Ketones, ur: NEGATIVE
Nitrite: NEGATIVE
Protein, ur: NEGATIVE
RBC / HPF: NONE SEEN /HPF (ref 0–2)
Specific Gravity, Urine: 1.015 (ref 1.001–1.035)
WBC, UA: NONE SEEN /HPF (ref 0–5)
pH: 6 (ref 5.0–8.0)

## 2022-02-24 LAB — CBC WITH DIFFERENTIAL/PLATELET
Absolute Monocytes: 585 cells/uL (ref 200–950)
Basophils Absolute: 39 cells/uL (ref 0–200)
Basophils Relative: 0.6 %
Eosinophils Absolute: 111 cells/uL (ref 15–500)
Eosinophils Relative: 1.7 %
HCT: 34.9 % — ABNORMAL LOW (ref 35.0–45.0)
Hemoglobin: 12.2 g/dL (ref 11.7–15.5)
Lymphs Abs: 1313 cells/uL (ref 850–3900)
MCH: 32.6 pg (ref 27.0–33.0)
MCHC: 35 g/dL (ref 32.0–36.0)
MCV: 93.3 fL (ref 80.0–100.0)
MPV: 10.2 fL (ref 7.5–12.5)
Monocytes Relative: 9 %
Neutro Abs: 4453 cells/uL (ref 1500–7800)
Neutrophils Relative %: 68.5 %
Platelets: 245 10*3/uL (ref 140–400)
RBC: 3.74 10*6/uL — ABNORMAL LOW (ref 3.80–5.10)
RDW: 11.9 % (ref 11.0–15.0)
Total Lymphocyte: 20.2 %
WBC: 6.5 10*3/uL (ref 3.8–10.8)

## 2022-02-24 LAB — URINE CULTURE
MICRO NUMBER:: 13865118
SPECIMEN QUALITY:: ADEQUATE

## 2022-02-24 LAB — LIPID PANEL
Cholesterol: 170 mg/dL (ref <200)
HDL: 83 mg/dL (ref >=50)
LDL Cholesterol (Calc): 65 mg/dL (calc)
Non-HDL Cholesterol (Calc): 87 mg/dL (calc) (ref <130)
Total CHOL/HDL Ratio: 2 (calc) (ref <5.0)
Triglycerides: 136 mg/dL (ref <150)

## 2022-02-24 LAB — COMPLETE METABOLIC PANEL WITH GFR
AG Ratio: 1.8 (calc) (ref 1.0–2.5)
ALT: 14 U/L (ref 6–29)
AST: 16 U/L (ref 10–35)
Albumin: 4.9 g/dL (ref 3.6–5.1)
Alkaline phosphatase (APISO): 88 U/L (ref 37–153)
BUN: 15 mg/dL (ref 7–25)
CO2: 29 mmol/L (ref 20–32)
Calcium: 9.7 mg/dL (ref 8.6–10.4)
Chloride: 94 mmol/L — ABNORMAL LOW (ref 98–110)
Creat: 0.83 mg/dL (ref 0.60–0.95)
Globulin: 2.8 g/dL (calc) (ref 1.9–3.7)
Glucose, Bld: 97 mg/dL (ref 65–99)
Potassium: 4.9 mmol/L (ref 3.5–5.3)
Sodium: 131 mmol/L — ABNORMAL LOW (ref 135–146)
Total Bilirubin: 0.4 mg/dL (ref 0.2–1.2)
Total Protein: 7.7 g/dL (ref 6.1–8.1)
eGFR: 69 mL/min/{1.73_m2} (ref >=60)

## 2022-02-24 LAB — MAGNESIUM: Magnesium: 1.8 mg/dL (ref 1.5–2.5)

## 2022-02-24 LAB — HEMOGLOBIN A1C
Hgb A1c MFr Bld: 5.6 % of total Hgb (ref <5.7)
Mean Plasma Glucose: 114 mg/dL
eAG (mmol/L): 6.3 mmol/L

## 2022-02-24 LAB — VITAMIN B12: Vitamin B-12: 1705 pg/mL — ABNORMAL HIGH (ref 200–1100)

## 2022-02-24 LAB — MICROSCOPIC MESSAGE

## 2022-02-27 NOTE — Progress Notes (Signed)
Patient is aware of lab results and instructions and nv to re-check sodium has been scheduled. -e welch

## 2022-03-05 ENCOUNTER — Ambulatory Visit: Payer: PPO

## 2022-03-06 ENCOUNTER — Ambulatory Visit (INDEPENDENT_AMBULATORY_CARE_PROVIDER_SITE_OTHER): Payer: PPO

## 2022-03-06 VITALS — BP 150/82 | HR 57 | Temp 97.5°F | Ht 64.0 in | Wt 107.0 lb

## 2022-03-06 DIAGNOSIS — E871 Hypo-osmolality and hyponatremia: Secondary | ICD-10-CM

## 2022-03-06 NOTE — Progress Notes (Signed)
Patient presents to the office for a nurse visit to have labs done to recheck sodium levels. Blood pressure (!) 150/82, pulse (!) 57, temperature (!) 97.5 F (36.4 C), height '5\' 4"'$  (1.626 m), weight 107 lb (48.5 kg), SpO2 99 %.

## 2022-03-07 LAB — COMPLETE METABOLIC PANEL WITH GFR
AG Ratio: 1.7 (calc) (ref 1.0–2.5)
ALT: 14 U/L (ref 6–29)
AST: 19 U/L (ref 10–35)
Albumin: 4.7 g/dL (ref 3.6–5.1)
Alkaline phosphatase (APISO): 81 U/L (ref 37–153)
BUN: 15 mg/dL (ref 7–25)
CO2: 29 mmol/L (ref 20–32)
Calcium: 9.7 mg/dL (ref 8.6–10.4)
Chloride: 97 mmol/L — ABNORMAL LOW (ref 98–110)
Creat: 0.85 mg/dL (ref 0.60–0.95)
Globulin: 2.8 g/dL (calc) (ref 1.9–3.7)
Glucose, Bld: 95 mg/dL (ref 65–99)
Potassium: 5.1 mmol/L (ref 3.5–5.3)
Sodium: 136 mmol/L (ref 135–146)
Total Bilirubin: 0.4 mg/dL (ref 0.2–1.2)
Total Protein: 7.5 g/dL (ref 6.1–8.1)
eGFR: 67 mL/min/{1.73_m2} (ref >=60)

## 2022-03-20 ENCOUNTER — Other Ambulatory Visit: Payer: Self-pay | Admitting: Cardiovascular Disease

## 2022-05-07 ENCOUNTER — Other Ambulatory Visit: Payer: Self-pay | Admitting: Internal Medicine

## 2022-05-07 DIAGNOSIS — F419 Anxiety disorder, unspecified: Secondary | ICD-10-CM

## 2022-05-07 NOTE — Telephone Encounter (Signed)
Pt is requesting a refill on Klonopin to go to [facility]

## 2022-05-09 ENCOUNTER — Other Ambulatory Visit: Payer: Self-pay | Admitting: Nurse Practitioner

## 2022-05-09 DIAGNOSIS — F419 Anxiety disorder, unspecified: Secondary | ICD-10-CM

## 2022-05-09 MED ORDER — CLONAZEPAM 0.5 MG PO TABS: ORAL_TABLET | ORAL | 0 refills | Status: DC

## 2022-05-23 ENCOUNTER — Emergency Department (HOSPITAL_COMMUNITY)
Admission: EM | Admit: 2022-05-23 | Discharge: 2022-05-23 | Disposition: A | Discharge status: Home / Self Care | Payer: PPO | Attending: Emergency Medicine | Admitting: Emergency Medicine

## 2022-05-23 ENCOUNTER — Other Ambulatory Visit: Payer: Self-pay

## 2022-05-23 ENCOUNTER — Emergency Department (HOSPITAL_COMMUNITY): Payer: PPO

## 2022-05-23 DIAGNOSIS — Z7901 Long term (current) use of anticoagulants: Secondary | ICD-10-CM | POA: Insufficient documentation

## 2022-05-23 DIAGNOSIS — R0689 Other abnormalities of breathing: Secondary | ICD-10-CM | POA: Diagnosis not present

## 2022-05-23 DIAGNOSIS — R110 Nausea: Secondary | ICD-10-CM | POA: Diagnosis not present

## 2022-05-23 DIAGNOSIS — Z91040 Latex allergy status: Secondary | ICD-10-CM | POA: Insufficient documentation

## 2022-05-23 DIAGNOSIS — M545 Low back pain, unspecified: Secondary | ICD-10-CM | POA: Diagnosis not present

## 2022-05-23 DIAGNOSIS — M5459 Other low back pain: Secondary | ICD-10-CM | POA: Diagnosis not present

## 2022-05-23 DIAGNOSIS — M8588 Other specified disorders of bone density and structure, other site: Secondary | ICD-10-CM | POA: Diagnosis not present

## 2022-05-23 DIAGNOSIS — I959 Hypotension, unspecified: Secondary | ICD-10-CM | POA: Diagnosis not present

## 2022-05-23 DIAGNOSIS — M549 Dorsalgia, unspecified: Secondary | ICD-10-CM | POA: Diagnosis not present

## 2022-05-23 MED ORDER — ACETAMINOPHEN 500 MG PO TABS
1000.0000 mg | ORAL_TABLET | Freq: Once | ORAL | Status: AC
  Administered 2022-05-23: 1000 mg via ORAL
  Filled 2022-05-23: qty 2

## 2022-05-23 MED ORDER — DICLOFENAC SODIUM 1 % EX GEL: CUTANEOUS | 3 refills | Status: DC

## 2022-05-23 NOTE — ED Provider Notes (Signed)
[facility] DEPARTMENT Provider Note   CSN: [id number] Arrival date & time: 05/23/22  0751     History  Chief Complaint  Patient presents with   Back Pain    Debra White is a 88 y.o. female.  The history is provided by the patient, medical records and a relative. No language interpreter was used.  Back Pain    88 year old female significant history of fibromyalgia, arthritis, emphysema, iron deficiency anemia brought here via EMS from her sisters home for back injury.  Patient reports 2 weeks ago while she was attempting to sit on her rollator she ended up slipping off and landed on the to the ground because the rollator was not locked.  She denies hitting her head or loss of consciousness but since then she has had progressive worsening lower back pain.  Pain is achy throbbing nonradiating but localized to her lower back.  She is able to ambulate using her walker but states her pain has becoming more intense.  She does not endorse any abdominal pain no urinary symptoms no blood in her urine.  She denies having fever or chills or dysuria.  No complaint of knee or ankle pain.  Home Medications Prior to Admission medications   Medication Sig Start Date End Date Taking? Authorizing Provider  acetaminophen (TYLENOL) 500 MG tablet Take 1,000 mg by mouth every 8 (eight) hours as needed for headache.     [provider]  amLODipine (NORVASC) 2.5 MG tablet TAKE 1 TABLET BY MOUTH DAILY 03/20/22   Croitoru, Dani Gobble, MD  apixaban (ELIQUIS) 2.5 MG TABS tablet Take  1 tablet  2 x /day (every 12 hours)  to Prevent Blood Clots 01/23/22   Unk Pinto, MD  atenolol (TENORMIN) 100 MG tablet Take  1 tablet  Daily  for B                                           /                            TAKE                           BY                   MOUTH 01/11/22   Unk Pinto, MD  clonazePAM (KLONOPIN) 0.5 MG tablet Take 1/2 to 1 tablet 2 to 3 x /day ONLY if needed  for Anxiety or Panic Attack  & limit to 5 days /Week to Avoid Addiction                                                          /                                                 TAKE  BY                                       MOUTH 05/09/22   [name], NP  conjugated estrogens (PREMARIN) vaginal cream Place 1 Applicatorful vaginally See admin instructions. Apply one applicatorful vaginally once a week    [provider]  Cyanocobalamin (VITAMIN B 12 PO) Take 1 tablet by mouth daily.    [provider]  diclofenac Sodium (VOLTAREN) 1 % GEL APPLY 2 GRAMS TOPICALLY 3-4 TIMES DAILY AS NEEDED FOR PAIN 10/20/20   Unk Pinto, MD  dicyclomine (BENTYL) 20 MG tablet TAKE 1 TABLET BY MOUTH FOUR TIMES DAILY WITH MEALS AND BEDTIME FOR CRAMPING NAUSEA OR BLOATING 12/27/21   [name], NP  gabapentin (NEURONTIN) 600 MG tablet TAKE 1/2 TO 1 TABLET BY MOUTH 3 TIMES DAILY FOR FACIAL PAIN 10/12/21   [name], NP  lovastatin (MEVACOR) 20 MG tablet TAKE 1 TABLET BY MOUTH EACH NIGHT AT BEDTIME FOR CHOLESTEROL 07/25/21   [name], NP  olmesartan (BENICAR) 40 MG tablet TAKE 1 TABLET BY MOUTH DAILY FOR BLOOD PRESSURE 09/20/21   Liane Comber, NP  omeprazole (PRILOSEC) 40 MG capsule Take  1 capsule Daily to Prevent Heartburn & Indigestion                           /                                        TAKE                           BY                       MOUTH 02/05/22   Unk Pinto, MD  polyvinyl alcohol (LIQUIFILM TEARS) 1.4 % ophthalmic solution Place 1 drop into both eyes 2 (two) times daily as needed for dry eyes.    [provider]  Vaginal Lubricant (REPLENS) GEL Place vaginally daily.    [provider]  Vitamin D-Vitamin K (VITAMIN K2-VITAMIN D3 PO) Take by mouth. 90 mcg of K2, 5,000 units of D3    [provider]      Allergies    Latex, Amoxicillin, Aspirin, Barbiturates, Celebrex  [celecoxib], Codeine, Evista [raloxifene], Lipitor [atorvastatin], Morphine and related, Oruvail [ketoprofen], Pantoprazole, Paraffin, Prochlorperazine, Proloprim [trimethoprim], Venlafaxine, Vibra-tab [doxycycline], Vioxx [rofecoxib], Betadine [povidone iodine], and Caffeine    Review of Systems   Review of Systems  Musculoskeletal:  Positive for back pain.  All other systems reviewed and are negative.   Physical Exam Updated Vital Signs BP (!) 149/80 (BP Location: Right Arm)   Pulse 71   Temp (!) 97.5 F (36.4 C)   Resp 14   SpO2 100%  Physical Exam Vitals and nursing note reviewed.  Constitutional:      General: She is not in acute distress.    Appearance: She is well-developed.  HENT:     Head: Atraumatic.  Eyes:     Conjunctiva/sclera: Conjunctivae normal.  Cardiovascular:     Rate and Rhythm: Normal rate and regular rhythm.     Pulses: Normal pulses.     Heart sounds: Normal heart sounds.  Pulmonary:     Effort: Pulmonary effort is normal.  Abdominal:     Palpations: Abdomen is soft.     Tenderness: There is no abdominal tenderness.  Musculoskeletal:        General: Tenderness (Tenderness along lumbar spine at the level of L4-L5 without crepitus or step-off.  No overlying changes.  Bilateral hips stable.) present.     Cervical back: Neck supple.  Skin:    Findings: No rash.  Neurological:     Mental Status: She is alert.  Psychiatric:        Mood and Affect: Mood normal.     ED Results / Procedures / Treatments   Labs (all labs ordered are listed, but only abnormal results are displayed) Labs Reviewed - No data to display  EKG None  Radiology DG Lumbar Spine Complete  Result Date: 05/23/2022 CLINICAL DATA:  Back pain fall EXAM: LUMBAR SPINE - COMPLETE 4+ VIEW COMPARISON:  07/04/2011 FINDINGS: Grade 1-2 L5 anterolisthesis noted. This is stable. Posterior fusion hardware noted at the L5-S1 level. Loss of disc space L1-L2 with marginal osteophytes. Diffuse  osteopenia. Dense atheromatous calcification of the aorta. IMPRESSION: Osteopenia. Degenerative changes. L5 anterolisthesis. Findings are stable compared to the prior study. Electronically Signed   By: Sammie Bench M.D.   On: 05/23/2022 10:20    Procedures Procedures    Medications Ordered in ED Medications  acetaminophen (TYLENOL) tablet 1,000 mg (1,000 mg Oral Given 05/23/22 1041)    ED Course/ Medical Decision Making/ A&P                           Medical Decision Making Amount and/or Complexity of Data Reviewed Radiology: ordered.  Risk OTC drugs.   BP (!) 149/80 (BP Location: Right Arm)   Pulse 71   Temp (!) 97.5 F (36.4 C)   Resp 14   SpO2 100%   52:72 AM   88 year old female significant history of fibromyalgia, arthritis, emphysema, iron deficiency anemia brought here via EMS from her sisters home for back injury.  Patient reports 2 weeks ago while she was attempting to sit on her rollator she ended up slipping off and landed on the to the ground because the rollator was not locked.  She denies hitting her head or loss of consciousness but since then she has had progressive worsening lower back pain.  Pain is achy throbbing nonradiating but localized to her lower back.  She is able to ambulate using her walker but states her pain has becoming more intense.  She does not endorse any abdominal pain no urinary symptoms no blood in her urine.  She denies having fever or chills or dysuria.  No complaint of knee or ankle pain.  On exam, patient has point tenderness along lumbar spine at the level L4-L5 without any crepitus step-off.  Bilateral hips stable and nontender.  No tenderness to her lower extremities and she has intact pedal pulses.  Abdomen is soft nontender.  Given her fall and having lower back pain, x-ray of lumbar spine ordered.  Pain medication given.  11:50 AM X-ray of lumbar spine obtained independently viewed interpreted by me and I agree with radiologist  interpretation.  Evidence of osteopenia on x-ray but no evidence of fracture or dislocation.  Patient given Tylenol for pain with some improvement.  Since patient is neurovascularly intact, able to ambulate using her walker at home and her lumbar spine x-ray did not show any fracture  or dislocation, I discussed care with Dr. Kathrynn Humble and we felt patient can follow-up outpatient with her orthopedist/neurosurgeon for outpatient management of her injury.  If she has persistent pain she may benefit from advanced imaging such as MRI or CT scan of her spine.  Patient gave return precaution.  I prescribe Voltaren gel for symptom control.        Final Clinical Impression(s) / ED Diagnoses Final diagnoses:  Acute midline low back pain without sciatica    Rx / DC Orders ED Discharge Orders          Ordered    diclofenac Sodium (VOLTAREN) 1 % GEL        05/23/22 1200              Domenic Moras, PA-C 05/23/22 Valders, Ankit, MD 05/24/22 1207

## 2022-05-23 NOTE — ED Triage Notes (Signed)
Patient BIB [facility] from her sister's home for evaluation of progressively worsening back pain that started after falling one week ago when attempting to sit on a rollator. No obvious deformities, history of chronic pain. Patient is alert, oriented, and in no apparent distress at this time.  BP 150/70 HR 80 RR 22 98% on room air

## 2022-05-23 NOTE — Discharge Instructions (Signed)
You have been evaluated for your lower back pain due to a fall.  Fortunately x-ray of your low back did not show any broken bone.  However you may benefit from outpatient follow-up with your orthopedist or neurosurgeon for further evaluation if your pain persist.  You may benefit from an MRI or a CT scan of your lumbar spine to ensure there are no evidence of any occult fracture.  You may apply Voltaren gel to your lower back several times daily to help with pain control.  You may take over-the-counter Tylenol as needed for pain.  Return to the ER if you have any concern.

## 2022-05-24 DIAGNOSIS — M5442 Lumbago with sciatica, left side: Secondary | ICD-10-CM | POA: Diagnosis not present

## 2022-05-24 DIAGNOSIS — M5441 Lumbago with sciatica, right side: Secondary | ICD-10-CM | POA: Diagnosis not present

## 2022-05-28 ENCOUNTER — Telehealth: Payer: Self-pay

## 2022-05-28 NOTE — Telephone Encounter (Signed)
LM-05/28/22-Reviewed pts. chart for recent ED visit on 11/29. Called pt. and was unable to reach pt. Unable to LVM. Will call patient at another time.    Total time spent: 8 min.

## 2022-05-28 NOTE — Telephone Encounter (Signed)
LM-05/28/22-Called pt. And spoke to her sister Olegario Shearer. Olegario Shearer told me Ivey was not there and that she was at home. Olegario Shearer stated the pt. Cannot hear well and I asked Olegario Shearer if she could complete the assessment in the pts. Behalf and she told me to call the pt. And just tell her to turn her phone up so she can hear me and hung up. Called pt. To complete assessment and was unable to reach pt. [place] X1.  Total time spent: 8 min.

## 2022-06-05 ENCOUNTER — Encounter: Payer: Self-pay | Admitting: Internal Medicine

## 2022-06-05 ENCOUNTER — Ambulatory Visit (INDEPENDENT_AMBULATORY_CARE_PROVIDER_SITE_OTHER): Payer: PPO | Admitting: Internal Medicine

## 2022-06-05 VITALS — BP 118/70 | HR 72 | Temp 97.6°F | Resp 16 | Ht 64.0 in | Wt 108.0 lb

## 2022-06-05 DIAGNOSIS — H60541 Acute eczematoid otitis externa, right ear: Secondary | ICD-10-CM

## 2022-06-05 DIAGNOSIS — Z23 Encounter for immunization: Secondary | ICD-10-CM

## 2022-06-05 DIAGNOSIS — K121 Other forms of stomatitis: Secondary | ICD-10-CM | POA: Diagnosis not present

## 2022-06-05 MED ORDER — CLOBETASOL PROPIONATE 0.05 % EX CREA: TOPICAL_CREAM | CUTANEOUS | 3 refills | Status: DC

## 2022-06-05 MED ORDER — TRIAMCINOLONE ACETONIDE 0.1 % MT PSTE: PASTE | OROMUCOSAL | 3 refills | Status: DC

## 2022-06-05 NOTE — Progress Notes (Signed)
Future Appointments  Date Time Provider Department  06/05/2022  3:00 PM Unk Pinto, MD [facility]  07/02/2022 10:00 AM [name], NP [facility]  10/22/2022 10:00 AM [name], NP [facility]    History of Present Illness:       Patient, a recently widowed WF presents with c/o rash in her Rt external ear - hallux and also "sores" or ulcerations of her lower inner lip. Denies any associated pain.    Medications     amLODipine (NORVASC) 2.5 MG tablet, TAKE 1 TABLET  DAILY   atenolol (TENORMIN) 100 MG tablet, Take  1 tablet  Daily    lovastatin (MEVACOR) 20 MG tablet, TAKE 1 TABLET  AT BEDTIME    olmesartan (BENICAR) 40 MG tablet, TAKE 1 TABLET DAILY    acetaminophen (TYLENOL) 500 MG tablet, Take 1,000 mg  every 8  hours as needed for headache.    apixaban (ELIQUIS) 2.5 MG TABS tablet, Take  1 tablet  2 x /day    VITAMIN B 12 , Take 1 tablet  daily.  Current Outpatient Medications (Other):    clonazePAM (KLONOPIN) 0.5 MG tablet, Take 1/2 to 1 tablet 2 to 3 x /day ONLY if needed   conjugated estrogens  vaginal cream, Place  vaginally  once a week   diclofenac  1 % GEL, APPLY 2 GRAMS  3-4 TIMES DAILY AS NEEDED   dicyclomine 20 MG tablet, TAKE 1 TABLET FOUR TIMES DAILY WITH MEALS AND BEDTIME    gabapentin 600 MG tablet, TAKE 1/2 TO 1 TABLET  3 TIMES DAILY FOR FACIAL PAIN   omeprazole (PRILOSEC) 40 MG capsule, Take  1 capsule Daily    LIQUIFILM TEARS ophth soln, Place 1 drop into both eyes 2  times daily as needed    Vaginal Lubricant (REPLENS) GEL, Place vaginally daily.   VITAMIN K2-VITAMIN D3 , Take b 90 mcg of K2, 5,000 units of D3  Problem list She has Headache, post-traumatic, chronic; Hypertension; Hyperlipidemia; GERD; Vitamin D deficiency; Atypical facial pain; Medication management; Mild malnutrition (HCC); COPD (chronic obstructive pulmonary disease) with emphysema ([place]); Tortuous aorta ([place]); Osteoporosis; Aortic atherosclerosis ([place]) by Abd CT scan  (05/2020) ; Hepatic steatosis; Renal infarct ([place]); Hyponatremia; Chronic hyperkalemia; Coronary atherosclerosis due to lipid rich plaque; Atrial fibrillation with controlled ventricular response ([place]); Diastolic CHF ([place]); History of embolic stroke; Epigastric discomfort; Anxiety; Thrombophilia ([place]); B12 deficiency; and Other abnormal glucose on their problem list.   Observations/Objective:  BP 118/70   Pulse 72   Temp 97.6 F (36.4 C)   Resp 16   Ht '5\' 4"'$  (1.626 m)   Wt 108 lb (49 kg)   SpO2 99%   BMI 18.54 kg/m   HEENT - WNL. Eczema type rash of the helix/anti-helix of  the Rt external ear and no defined skin lesions or ulcerations identified.  Oral exam finds 2 superficial ulcerations of the lower anterior buccal mucosa.  Neck - supple.  Chest - Clear equal BS. Cor - Nl HS. RRR w/o sig M MS- FROM w/o deformities.  Gait Nl. Neuro -  Nl w/o focal abnormalities.  Assessment and Plan:   1. Mouth ulcers  - triamcinolone (KENALOG) 0.1 % paste;  Apply to Lip Ulcers 3 to 4 x/day   Dispense: 10 g; Refill: 3   2. Eczema of right external ear  - clobetasol cream (TEMOVATE) 0.05 %;  Apply to rash on Right Ear   3 to 4 x /day   Dispense: 60 g;  Refill: 3   3. Need for immunization against influenza  - Flu vaccine HIGH DOSE PF (Fluzone High dose)    Follow Up Instructions:        I discussed the assessment and treatment plan with the patient. The patient was provided an opportunity to ask questions and all were answered. The patient agreed with the plan and demonstrated an understanding of the instructions.       The patient was advised to call back or seek an in-person evaluation if the symptoms worsen or if the condition fails to improve as anticipated.    Kirtland Bouchard, MD

## 2022-06-08 ENCOUNTER — Other Ambulatory Visit: Payer: Self-pay | Admitting: Internal Medicine

## 2022-06-08 DIAGNOSIS — F419 Anxiety disorder, unspecified: Secondary | ICD-10-CM

## 2022-06-12 ENCOUNTER — Telehealth: Payer: Self-pay

## 2022-06-12 NOTE — Telephone Encounter (Signed)
        Patient  visited [facility] on 05/23/2022  for acute midline low back pain without sciatica.   Telephone encounter attempt :  1st  Unable to leave message on voicemail.   [facility] Resource Care Guide   ??[email or web address]  ?? [phone]   Website: [email or web address]  [email or web address]

## 2022-06-13 ENCOUNTER — Telehealth: Payer: Self-pay

## 2022-06-13 NOTE — Telephone Encounter (Signed)
     Patient  visit on 05/23/2022  at [facility] was for acute midline low back pain without sciatica.  Have you been able to follow up with your primary care physician? Yes  The patient was or was not able to obtain any needed medicine or equipment. No medication prescribed.  Are there diet recommendations that you are having difficulty following? No  Patient expresses understanding of discharge instructions and education provided has no other needs at this time.    [facility] Resource Care Guide   [email or web address]  ?? [phone]   Website: [email or web address]  [email or web address]

## 2022-06-20 ENCOUNTER — Other Ambulatory Visit: Payer: Self-pay | Admitting: Nurse Practitioner

## 2022-06-20 DIAGNOSIS — G500 Trigeminal neuralgia: Secondary | ICD-10-CM

## 2022-06-20 DIAGNOSIS — K589 Irritable bowel syndrome without diarrhea: Secondary | ICD-10-CM

## 2022-06-22 ENCOUNTER — Encounter: Payer: PPO | Admitting: Nurse Practitioner

## 2022-06-28 DIAGNOSIS — M545 Low back pain, unspecified: Secondary | ICD-10-CM | POA: Diagnosis not present

## 2022-07-02 ENCOUNTER — Encounter: Payer: Self-pay | Admitting: Nurse Practitioner

## 2022-07-02 ENCOUNTER — Ambulatory Visit (INDEPENDENT_AMBULATORY_CARE_PROVIDER_SITE_OTHER): Payer: PPO | Admitting: Nurse Practitioner

## 2022-07-02 VITALS — BP 130/76 | HR 69 | Temp 97.5°F | Ht 63.25 in | Wt 107.0 lb

## 2022-07-02 DIAGNOSIS — Z0001 Encounter for general adult medical examination with abnormal findings: Secondary | ICD-10-CM | POA: Diagnosis not present

## 2022-07-02 DIAGNOSIS — G501 Atypical facial pain: Secondary | ICD-10-CM

## 2022-07-02 DIAGNOSIS — Z79899 Other long term (current) drug therapy: Secondary | ICD-10-CM | POA: Diagnosis not present

## 2022-07-02 DIAGNOSIS — I10 Essential (primary) hypertension: Secondary | ICD-10-CM | POA: Diagnosis not present

## 2022-07-02 DIAGNOSIS — M810 Age-related osteoporosis without current pathological fracture: Secondary | ICD-10-CM

## 2022-07-02 DIAGNOSIS — E782 Mixed hyperlipidemia: Secondary | ICD-10-CM | POA: Diagnosis not present

## 2022-07-02 DIAGNOSIS — E441 Mild protein-calorie malnutrition: Secondary | ICD-10-CM

## 2022-07-02 DIAGNOSIS — R6889 Other general symptoms and signs: Secondary | ICD-10-CM | POA: Diagnosis not present

## 2022-07-02 DIAGNOSIS — R7309 Other abnormal glucose: Secondary | ICD-10-CM | POA: Diagnosis not present

## 2022-07-02 DIAGNOSIS — I700 Atherosclerosis of aorta: Secondary | ICD-10-CM

## 2022-07-02 DIAGNOSIS — Z136 Encounter for screening for cardiovascular disorders: Secondary | ICD-10-CM | POA: Diagnosis not present

## 2022-07-02 DIAGNOSIS — K760 Fatty (change of) liver, not elsewhere classified: Secondary | ICD-10-CM

## 2022-07-02 DIAGNOSIS — I5032 Chronic diastolic (congestive) heart failure: Secondary | ICD-10-CM

## 2022-07-02 DIAGNOSIS — Z1389 Encounter for screening for other disorder: Secondary | ICD-10-CM | POA: Diagnosis not present

## 2022-07-02 DIAGNOSIS — Z1211 Encounter for screening for malignant neoplasm of colon: Secondary | ICD-10-CM

## 2022-07-02 DIAGNOSIS — K219 Gastro-esophageal reflux disease without esophagitis: Secondary | ICD-10-CM

## 2022-07-02 DIAGNOSIS — H6121 Impacted cerumen, right ear: Secondary | ICD-10-CM | POA: Diagnosis not present

## 2022-07-02 DIAGNOSIS — H6122 Impacted cerumen, left ear: Secondary | ICD-10-CM

## 2022-07-02 DIAGNOSIS — J439 Emphysema, unspecified: Secondary | ICD-10-CM

## 2022-07-02 DIAGNOSIS — N280 Ischemia and infarction of kidney: Secondary | ICD-10-CM

## 2022-07-02 DIAGNOSIS — I4891 Unspecified atrial fibrillation: Secondary | ICD-10-CM

## 2022-07-02 DIAGNOSIS — I2583 Coronary atherosclerosis due to lipid rich plaque: Secondary | ICD-10-CM

## 2022-07-02 DIAGNOSIS — E559 Vitamin D deficiency, unspecified: Secondary | ICD-10-CM | POA: Diagnosis not present

## 2022-07-02 DIAGNOSIS — F419 Anxiety disorder, unspecified: Secondary | ICD-10-CM

## 2022-07-02 DIAGNOSIS — G44329 Chronic post-traumatic headache, not intractable: Secondary | ICD-10-CM

## 2022-07-02 DIAGNOSIS — I2510 Atherosclerotic heart disease of native coronary artery without angina pectoris: Secondary | ICD-10-CM

## 2022-07-02 DIAGNOSIS — I771 Stricture of artery: Secondary | ICD-10-CM

## 2022-07-02 DIAGNOSIS — M545 Low back pain, unspecified: Secondary | ICD-10-CM

## 2022-07-02 DIAGNOSIS — R239 Unspecified skin changes: Secondary | ICD-10-CM

## 2022-07-02 NOTE — Progress Notes (Signed)
Complete Physical  Assessment:    Encounter for General Adult Medical Examination with Abnormal Findings Due annually  Health maintenance reviewed  Tortuous aorta ([place]) Control blood pressure, cholesterol, glucose, increase exercise.   Renal infarct (HCC) Continue on Eliquis Monitor GFR  Atrial fibrillation with controlled ventricular response (HCC)/ thombophilia (HCC) Continue Eliquis - rate controlled Cardiology following Monitor for any increase s/s of bleeding. Call 911 or report to ER for any fall or injury to the head.   Aortic atherosclerosis (HCC) Control blood pressure, cholesterol, glucose, increase exercise.   Chronic diastolic congestive heart failure (HCC) Discussed DASH (Dietary Approaches to Stop Hypertension) DASH diet is lower in sodium than a typical American diet.  Pulmonary emphysema, unspecified emphysema type ([place]) Per CXR; likely age related; denies sx; monitor  Mild malnutrition ([place]) Resume remeron PRN appetite if weights trending back down . Incorporate importance of protein and high caloric foods  Essential hypertension Discussed DASH (Dietary Approaches to Stop Hypertension) DASH diet is lower in sodium than a typical American diet. Cut back on foods that are high in saturated fat, cholesterol, and trans fats. Eat more whole-grain foods, fish, poultry, and nuts Remain active and exercise as tolerated daily.  Monitor BP at home-Call if greater than 130/80.  Check CMP/CBC   Coronary atherosclerosis due to lipid rich plaque Control blood pressure, cholesterol, glucose, increase exercise.   Vitamin D deficiency Continue supplement Monitor levels  Medication management All medications discussed and reviewed in full. All questions and concerns regarding medications addressed.    Mixed hyperlipidemia Discussed lifestyle modifications. Recommended diet heavy in fruits and veggies, omega 3's. Decrease consumption of animal meats, cheeses,  and dairy products. Remain active and exercise as tolerated. Continue to monitor. Check lipids/TSH   History of embolic stroke Continue eliquis Control blood pressure, cholesterol, glucose, increase exercise.   Chronic post-traumatic headache, not intractable Continue klonopin - limited use Continue Gabapentin.  Atypical facial pain Continue Gabapentin    Hepatic steatosis Check labs, avoid tylenol, alcohol   Gastroesophageal reflux disease, unspecified whether esophagitis present No suspected reflux complications ([place]/stricture). Lifestyle modification:  wt loss, avoid meals 2-3h before bedtime. Consider eliminating food triggers:  chocolate, caffeine, EtOH, acid/spicy food.  Osteoporosis, unspecified osteoporosis type, unspecified pathological fracture presence  DEXA UTD 03/2021, osteoporosis with forearm T score 3.7 Pursue a combination of weight-bearing exercises and strength training. Patients with severe mobility impairment should be referred for physical therapy. Advised on fall prevention measures including proper lighting in all rooms, removal of area rugs and floor clutter, use of walking devices as deemed appropriate, avoidance of uneven walking surfaces. Smoking cessation and moderate alcohol consumption if applicable Consume 789 to 1000 IU of vitamin D daily with a goal vitamin D serum value of 30 ng/mL or higher. Aim for 1000 to 1200 mg of elemental calcium daily through supplements and/or dietary sources.   Abnormal glucose/ Prediabetes Education: Reviewed 'ABCs' of diabetes management  Discussed goals to be met and/or maintained include A1C (<7) Blood pressure (<130/80) Cholesterol (LDL <70) Continue Eye Exam yearly  Continue Dental Exam Q6 mo Discussed dietary recommendations Discussed Physical Activity recommendations Check A1C  Low back pain Resolved  Skin change right ear Non-healing Refer to Dermatology  Bilateral ear impaction Bilateral  ear(s) irrigated with lukewarm water, hydrogen peroxide. Large piece of brown cerumen x3 extracted with ear curette. TM:  WNL.  Pt tolerated procedure well.    Screening for hematuria or proteinuria -UA/Microalbumin  Screening for colon cancer Completed 9/20213 Recall 5  years Due 02/2017 - Overdue Refer to GI  Orders Placed This Encounter  Procedures   CBC with Differential/Platelet   COMPLETE METABOLIC PANEL WITH GFR   Magnesium   Lipid panel   TSH   Hemoglobin A1c   Insulin, random   VITAMIN D 25 Hydroxy (Vit-D Deficiency, Fractures)   Urinalysis, Routine w reflex microscopic   Microalbumin / creatinine urine ratio   Ambulatory referral to Dermatology    Referral Priority:   Routine    Referral Type:   Consultation    Referral Reason:   Specialty Services Required    Requested Specialty:   Dermatology    Number of Visits Requested:   1   Ambulatory referral to Gastroenterology    Referral Priority:   Routine    Referral Type:   Consultation    Referral Reason:   Specialty Services Required    Number of Visits Requested:   1   EKG 12-Lead   Ear Lavage    Future Appointments  Date Time Provider [place]  10/22/2022 10:00 AM Debra Rossetti, NP [facility] None  07/03/2023 10:00 AM Debra Jump, NP [facility] None       Subjective:  Debra White is a 89 y.o. female who presents for Annual CPE visit.   Overall she reports feeling well.  She did have a recent visit to the ED on 04/2022 for acute midline low back pain without sciatica which was d/t a prior fall two weeks before.  She had no red flags.  She was able to ambulate and x-ray of the lumbar spine did not show any acute findings.  She was treated with 1,000 mg Tylenol.  There was no indication for MRI. She was discharged and instructed to f/u with Orthopedist/Neurosurgeon.  If she were to have any increase in pain, work up for MRI was warranted as outpatient.  She was discharged on Voltaren gel for  symptom control.   She is concerned for a non-healing abrasion on her right ear.  Reports pain at first, could not see the area, had assessed during 06/05/22 OV with Dr. Melford White.  She was prescribed topical clobestasol propionate cream without any relief.  Abrasion is still present and painful.  Denies any drainage.    Husband has dementia, she continues to take care of him at home - was in skilled facility for a month. She does have hospice that comes in to help  She reports GERD is well controlled by omeprazole 20 mg daily. Uses bentyl occasionally for cramping and diarrhea.   She has residual face pain related to fall and zygomatic fracture with hardware implanted, manages pain with gabapentin 600 mg TID. No new or recent concerns.    BMI is Body mass index is 18.8 kg/m., she is working on diet, minimal intentional exercise but active as caregiver. Uses remeron intermittently to help with appetite and maintain weight.  Wt Readings from Last 3 Encounters:  07/02/22 107 lb (48.5 kg)  06/05/22 108 lb (49 kg)  03/06/22 107 lb (48.5 kg)   She had splenic and renal infarcts in 2018, history of CVA and on Eliquis 2.5 mg BID for Afib.  CAD, diastolic CHF, Echo 3086 EF 55-60% with severe LAE. Declines TEE and loop recorder, follows with cardiology Dr. Oval Linsey. She also has aortic atherosclerosis per CT 05/2020.    Her blood pressure has not been checking BP at home, today their BP is BP: 130/76 BP Readings from Last 3 Encounters:  07/02/22  130/76  06/05/22 118/70  05/23/22 132/75    She does not workout. She denies chest pain, shortness of breath, dizziness.   She is on cholesterol medication (lovastatin 20 mg daily) and denies myalgias at the current dose, had myalgias with higher doses. Her cholesterol is not at goal. The cholesterol last visit was:   Lab Results  Component Value Date   CHOL 170 02/23/2022   HDL 83 02/23/2022   LDLCALC 65 02/23/2022   TRIG 136 02/23/2022   CHOLHDL 2.0  02/23/2022   She has prediabetes.  has not been working on diet and exercise for prediabetes, and denies hyperglycemia, hypoglycemia , nausea, polydipsia, polyuria and visual disturbances. Last A1C in the office was:  Lab Results  Component Value Date   HGBA1C 5.6 02/23/2022   Last GFR: Lab Results  Component Value Date   GFRNONAA 65 10/20/2020   Patient is on Vitamin D supplement.   Lab Results  Component Value Date   VD25OH 93 12/15/2019    Medication Review:   Current Outpatient Medications (Cardiovascular):    amLODipine (NORVASC) 2.5 MG tablet, TAKE 1 TABLET BY MOUTH DAILY   atenolol (TENORMIN) 100 MG tablet, Take  1 tablet  Daily  for B                                           /                            TAKE                           BY                   MOUTH   lovastatin (MEVACOR) 20 MG tablet, TAKE 1 TABLET BY MOUTH EACH NIGHT AT BEDTIME FOR CHOLESTEROL   olmesartan (BENICAR) 40 MG tablet, TAKE 1 TABLET BY MOUTH DAILY FOR BLOOD PRESSURE   Current Outpatient Medications (Analgesics):    acetaminophen (TYLENOL) 500 MG tablet, Take 1,000 mg by mouth every 8 (eight) hours as needed for headache.   Current Outpatient Medications (Hematological):    apixaban (ELIQUIS) 2.5 MG TABS tablet, Take  1 tablet  2 x /day (every 12 hours)  to Prevent Blood Clots   Cyanocobalamin (VITAMIN B 12 PO), Take 1 tablet by mouth daily.  Current Outpatient Medications (Other):    clobetasol cream (TEMOVATE) 0.05 %, Apply to rash on Right Ear   3 to 4 x /day   clonazePAM (KLONOPIN) 0.5 MG tablet, Take 1/2 to 1 tablet 2 to 3 x /day ONLY if needed for Anxiety or Panic Attack & limit to 5 days /Week to Avoid Addiction / TAKE BY MOUTH   conjugated estrogens (PREMARIN) vaginal cream, Place 1 Applicatorful vaginally See admin instructions. Apply one applicatorful vaginally once a week   diclofenac Sodium (VOLTAREN) 1 % GEL, APPLY 2 GRAMS TOPICALLY 3-4 TIMES DAILY AS NEEDED FOR PAIN   dicyclomine  (BENTYL) 20 MG tablet, TAKE 1 TABLET BY MOUTH FOUR TIMES DAILY WITH MEALS AND BEDTIME FOR CRAMPING NAUSEA OR BLOATING   gabapentin (NEURONTIN) 600 MG tablet, TAKE 1/2 TO 1 TABLET BY MOUTH 3 TIMES DAILY FOR FACIAL PAIN   omeprazole (PRILOSEC) 40 MG capsule, Take  1 capsule Daily to Prevent Heartburn & Indigestion                           /  TAKE                           BY                       MOUTH   polyvinyl alcohol (LIQUIFILM TEARS) 1.4 % ophthalmic solution, Place 1 drop into both eyes 2 (two) times daily as needed for dry eyes.   triamcinolone (KENALOG) 0.1 % paste, Apply to Lip Ulcers 3 to 4 x/day   Vaginal Lubricant (REPLENS) GEL, Place vaginally daily.   Vitamin D-Vitamin K (VITAMIN K2-VITAMIN D3 PO), Take by mouth. 90 mcg of K2, 5,000 units of D3   Allergies  Allergen Reactions   Latex Hives    TONGUE HAD BLISTERS WHEN HAD DENTAL SURGERY   Amoxicillin Nausea And Vomiting   Aspirin Other (See Comments)    Gi upset   Barbiturates Other (See Comments)    Unknown reaction   Celebrex [Celecoxib] Other (See Comments)    Unknown reaction   Codeine Itching   Evista [Raloxifene] Other (See Comments)    Unknown reaction   Lipitor [Atorvastatin] Other (See Comments)    Unknown reaction   Morphine And Related Other (See Comments)    Unknown reaction   Oruvail [Ketoprofen] Other (See Comments)    Unknown reaction   Pantoprazole Diarrhea   Paraffin Other (See Comments)    Unknown reaction   Prochlorperazine Other (See Comments)    Uncontrolled shaking   Proloprim [Trimethoprim] Other (See Comments)    Unknown reaction   Venlafaxine Nausea Only   Vibra-Tab [Doxycycline] Other (See Comments)    Unknown reaction   Vioxx [Rofecoxib] Other (See Comments)    edema   Betadine [Povidone Iodine] Rash   Caffeine Palpitations    Current Problems (verified) Patient Active Problem List   Diagnosis Date Noted   B12 deficiency 10/20/2021    Thrombophilia ([place]) 10/20/2020   Anxiety 43/32/9518   Diastolic CHF ([place]) 84/16/6063   History of embolic stroke 01/60/1093   Atrial fibrillation with controlled ventricular response ([place]) 03/12/2019   Coronary atherosclerosis due to lipid rich plaque 03/26/2018   Renal infarct ([place]) 03/22/2018   Chronic hyperkalemia 03/22/2018   Hepatic steatosis 11/17/2017   Aortic atherosclerosis ([place]) by Abd CT scan (05/2020)  08/08/2017   COPD (chronic obstructive pulmonary disease) with emphysema ([place]) 12/21/2015   Tortuous aorta (HCC) 12/21/2015   Osteoporosis 12/21/2015   Mild malnutrition ([place]) 05/11/2015   Medication management 01/27/2014   Atypical facial pain 10/08/2013   Vitamin D deficiency 07/08/2013   Hypertension    Hyperlipidemia    GERD    Headache, post-traumatic, chronic 09/20/2011    Screening Tests Immunization History  Administered Date(s) Administered   Influenza, High Dose Seasonal PF 03/25/2014, 04/05/2016, 06/02/2018, 03/12/2019, 03/23/2020, 05/02/2021, 06/05/2022   Influenza-Unspecified 03/09/2013, 03/11/2015   PFIZER(Purple Top)SARS-COV-2 Vaccination 06/01/2020, 06/22/2020   Pneumococcal Conjugate-13 12/21/2015   Pneumococcal Polysaccharide-23 06/25/2002   Td 06/26/2003   Preventative care: Last colonoscopy: 02/2012 - 5 year recall Overdue - order placed EGD 2013  Last mammogram: 10/2021 DEXA: 04/11/21 osteoporosis Due 2024  Prior vaccinations: TD or Tdap: 2005, declines, will get PRN Influenza: 05/2022 Pneumococcal: 2004 Prevnar13: 2017 Shingles/Zostavax: N/A Covid 19: 2/2, 2021  Names of Other Physician/Practitioners you currently use: 1.  [facility] here for primary care 2.  Eye Exam, Dr. Sabra Heck 2022 3. Dentist 2022  Patient Care Team: Unk Pinto, MD as PCP -  General (Internal Medicine) Croitoru, Dani Gobble, MD as PCP - Cardiology (Cardiology) Darleen Crocker, MD as Consulting Physician (Ophthalmology) Ladene Artist, MD as Consulting Physician (Gastroenterology) Cameron Sprang, MD as Consulting Physician (Neurology) Jodi Marble, MD as Consulting Physician (Otolaryngology) Rush Landmark, [facility] (Inactive) as Pharmacist (Pharmacist)  SURGICAL HISTORY She  has a past surgical history that includes Abdominal hysterectomy; Hand surgery; Shoulder arthroscopy w/ rotator cuff repair; Cervical disc surgery; Back surgery; ORIF tripod fracture (07/13/2011); Colonoscopy; Facial fracture surgery; Back surgery; Ptosis repair (Bilateral, 11/22/2016); and Breast excisional biopsy. FAMILY HISTORY Her family history includes Breast cancer (age of onset: 30) in her sister; Cancer in her sister, sister, and sister; Colon cancer in her sister; Heart attack in her father; Heart disease in her father, mother, and sister; Hyperlipidemia in her daughter; Hypertension in her daughter. SOCIAL HISTORY She  reports that she has never smoked. She has never been exposed to tobacco smoke. She has never used smokeless tobacco. She reports that she does not drink alcohol and does not use drugs.    Review of Systems  Constitutional:  Negative for malaise/fatigue and weight loss.  HENT:  Negative for hearing loss and tinnitus.   Eyes:  Negative for blurred vision and double vision.  Respiratory:  Negative for cough, sputum production, shortness of breath and wheezing.        Chronic facial pain, well controlled  Cardiovascular:  Negative for chest pain, palpitations, orthopnea, claudication, leg swelling and PND.  Gastrointestinal:  Negative for abdominal pain, blood in stool, constipation, diarrhea (intermittent), heartburn, melena, nausea and vomiting.  Genitourinary: Negative.   Musculoskeletal:  Negative for falls, joint pain and myalgias.  Skin:  Negative for rash.       Reports abrasion  Neurological:  Negative for dizziness, tingling, sensory change, weakness and headaches.  Endo/Heme/Allergies:  Negative for polydipsia.   Psychiatric/Behavioral: Negative.  Negative for depression, memory loss, substance abuse and suicidal ideas. The patient is not nervous/anxious and does not have insomnia.   All other systems reviewed and are negative.    Objective:     Today's Vitals   07/02/22 0957  BP: 130/76  Pulse: 69  Temp: (!) 97.5 F (36.4 C)  SpO2: 97%  Weight: 107 lb (48.5 kg)  Height: 5' 3.25" (1.607 m)   Body mass index is 18.8 kg/m.  General appearance: alert, no distress, WD/WN, thin elderly female HEENT: normocephalic, sclerae anicteric, TMs pearly right, left cerumen impaction, nares patent, no discharge or erythema, pharynx normal. Mildly HOH, hearing aids.  Oral cavity: MMM, no lesions Neck: supple, no lymphadenopathy, no thyromegaly, no masses Heart: RRR, normal S1, S2, no murmurs Lungs: CTA bilaterally, no wheezes, rhonchi, or rales Abdomen: +bs, soft, diffuse tenderness, non distended, no masses, no hepatomegaly, no splenomegaly Musculoskeletal: nontender, no swelling, no obvious deformity Extremities: no edema, no cyanosis, no clubbing Pulses: 2+ symmetric, upper and lower extremities, normal cap refill Neurological: alert, oriented x 3, CN2-12 intact, strength normal upper extremities and lower extremities, sensation normal throughout, DTRs 2+ throughout, no cerebellar signs, gait normal  Psychiatric: normal affect, behavior normal, pleasant  Skin:  See attached photo   EKG: PAC, no ST changes    Melvin Whiteford, NP   06/16/2019

## 2022-07-02 NOTE — Patient Instructions (Signed)
You have been referred to Dermatology.  Listen out for them to contact you to schedule and appointment.  If you have not heard anything in one week contact our office.  You are due for a Colonoscopy.  I have placed an order for you to see the specialist to have this completed.  Here is information about your anxiety medication:  Clonazepam Tablets What is this medication? CLONAZEPAM (kloe NA ze pam) treats seizures. It may also be used to treat panic disorder. It works by Child psychotherapist system calm down. It belongs to a group of medications called benzodiazepines. This medicine may be used for other purposes; ask your health care provider or pharmacist if you have questions. COMMON BRAND NAME(S): Ceberclon, Klonopin What should I tell my care team before I take this medication? They need to know if you have any of these conditions: An alcohol or drug abuse problem Bipolar disorder, depression, psychosis or other mental health condition Glaucoma Kidney or liver disease Lung or breathing disease Myasthenia gravis Parkinson disease Porphyria Seizures or a history of seizures Suicidal thoughts An unusual or allergic reaction to clonazepam, other benzodiazepines, foods, dyes, or preservatives Pregnant or trying to get pregnant Breast-feeding How should I use this medication? Take this medication by mouth with a glass of water. Follow the directions on the prescription label. If it upsets your stomach, take it with food or milk. Take your medication at regular intervals. Do not take it more often than directed. Do not stop taking or change the dose except on the advice of your care team. A special MedGuide will be given to you by the pharmacist with each prescription and refill. Be sure to read this information carefully each time. Talk to your care team regarding the use of this medication in children. Special care may be needed. Overdosage: If you think you have taken too much of this  medicine contact a poison control center or emergency room at once. NOTE: This medicine is only for you. Do not share this medicine with others. What if I miss a dose? If you miss a dose, take it as soon as you can. If it is almost time for your next dose, take only that dose. Do not take double or extra doses. What may interact with this medication? Do not take this medication with any of the following: Narcotic medications for cough Sodium oxybate This medication may also interact with the following: Alcohol Antihistamines for allergy, cough and cold Antiviral medications for HIV or AIDS Certain medications for anxiety or sleep Certain medications for depression, like amitriptyline, fluoxetine, sertraline Certain medications for fungal infections like ketoconazole and itraconazole Certain medications for seizures like carbamazepine, phenobarbital, phenytoin, primidone General anesthetics like halothane, isoflurane, methoxyflurane, propofol Local anesthetics like lidocaine, pramoxine, tetracaine Medications that relax muscles for surgery Narcotic medications for pain Phenothiazines like chlorpromazine, mesoridazine, prochlorperazine, thioridazine This list may not describe all possible interactions. Give your health care provider a list of all the medicines, herbs, non-prescription drugs, or dietary supplements you use. Also tell them if you smoke, drink alcohol, or use illegal drugs. Some items may interact with your medicine. What should I watch for while using this medication? Tell your care team if your symptoms do not start to get better or if they get worse. Do not stop taking except on your care team's advice. You may develop a severe reaction. Your care team will tell you how much medication to take. You may get drowsy or dizzy. Do  not drive, use machinery, or do anything that needs mental alertness until you know how this medication affects you. To reduce the risk of dizzy and  fainting spells, do not stand or sit up quickly, especially if you are an older patient. Alcohol may increase dizziness and drowsiness. Avoid alcoholic drinks. If you are taking another medication that also causes drowsiness, you may have more side effects. Give your care team a list of all medications you use. Your care team will tell you how much medication to take. Do not take more medication than directed. Call emergency services if you have problems breathing or unusual sleepiness. The use of this medication may increase the chance of suicidal thoughts or actions. Pay special attention to how you are responding while on this medication. Any worsening of mood, or thoughts of suicide or dying should be reported to your care team right away. What side effects may I notice from receiving this medication? Side effects that you should report to your care team as soon as possible: Allergic reactions--skin rash, itching, hives, swelling of the face, lips, tongue, or throat CNS depression--slow or shallow breathing, shortness of breath, feeling faint, dizziness, confusion, trouble staying awake Thoughts of suicide or self-harm, worsening mood, feelings of depression Side effects that usually do not require medical attention (report to your care team if they continue or are bothersome): Dizziness Drowsiness Headache This list may not describe all possible side effects. Call your doctor for medical advice about side effects. You may report side effects to FDA at 1-800-FDA-1088. Where should I keep my medication? Keep out of the reach of children and pets. This medication can be abused. Keep your medication in a safe place to protect it from theft. Do not share this medication with anyone. Selling or giving away this medication is dangerous and against the law. Store at room temperature between 15 and 30 degrees C (59 and 86 degrees F). Protect from light. Keep container tightly closed. This medication may  cause accidental overdose and death if taken by other adults, children, or pets. Mix any unused medication with a substance like cat litter or coffee grounds. Then throw the medication away in a sealed container like a sealed bag or a coffee can with a lid. Do not use the medication after the expiration date. NOTE: This sheet is a summary. It may not cover all possible information. If you have questions about this medicine, talk to your doctor, pharmacist, or health care provider.  2023 Elsevier/Gold Standard (2020-06-13 00:00:00)

## 2022-07-03 LAB — CBC WITH DIFFERENTIAL/PLATELET
Absolute Monocytes: 1071 cells/uL — ABNORMAL HIGH (ref 200–950)
Basophils Absolute: 42 cells/uL (ref 0–200)
Basophils Relative: 0.4 %
Eosinophils Absolute: 32 cells/uL (ref 15–500)
Eosinophils Relative: 0.3 %
HCT: 38.4 % (ref 35.0–45.0)
Hemoglobin: 13.1 g/dL (ref 11.7–15.5)
Lymphs Abs: 1911 cells/uL (ref 850–3900)
MCH: 32.4 pg (ref 27.0–33.0)
MCHC: 34.1 g/dL (ref 32.0–36.0)
MCV: 95 fL (ref 80.0–100.0)
MPV: 9.9 fL (ref 7.5–12.5)
Monocytes Relative: 10.2 %
Neutro Abs: 7445 cells/uL (ref 1500–7800)
Neutrophils Relative %: 70.9 %
Platelets: 312 10*3/uL (ref 140–400)
RBC: 4.04 10*6/uL (ref 3.80–5.10)
RDW: 12.1 % (ref 11.0–15.0)
Total Lymphocyte: 18.2 %
WBC: 10.5 10*3/uL (ref 3.8–10.8)

## 2022-07-03 LAB — COMPLETE METABOLIC PANEL WITH GFR
AG Ratio: 1.7 (calc) (ref 1.0–2.5)
ALT: 17 U/L (ref 6–29)
AST: 16 U/L (ref 10–35)
Albumin: 4.9 g/dL (ref 3.6–5.1)
Alkaline phosphatase (APISO): 75 U/L (ref 37–153)
BUN: 16 mg/dL (ref 7–25)
CO2: 31 mmol/L (ref 20–32)
Calcium: 10.1 mg/dL (ref 8.6–10.4)
Chloride: 96 mmol/L — ABNORMAL LOW (ref 98–110)
Creat: 0.87 mg/dL (ref 0.60–0.95)
Globulin: 2.9 g/dL (calc) (ref 1.9–3.7)
Glucose, Bld: 101 mg/dL — ABNORMAL HIGH (ref 65–99)
Potassium: 4.3 mmol/L (ref 3.5–5.3)
Sodium: 137 mmol/L (ref 135–146)
Total Bilirubin: 0.4 mg/dL (ref 0.2–1.2)
Total Protein: 7.8 g/dL (ref 6.1–8.1)
eGFR: 65 mL/min/{1.73_m2} (ref >=60)

## 2022-07-03 LAB — LIPID PANEL
Cholesterol: 192 mg/dL (ref <200)
HDL: 101 mg/dL (ref >=50)
LDL Cholesterol (Calc): 66 mg/dL (calc)
Non-HDL Cholesterol (Calc): 91 mg/dL (calc) (ref <130)
Total CHOL/HDL Ratio: 1.9 (calc) (ref <5.0)
Triglycerides: 171 mg/dL — ABNORMAL HIGH (ref <150)

## 2022-07-03 LAB — URINALYSIS, ROUTINE W REFLEX MICROSCOPIC
Bilirubin Urine: NEGATIVE
Glucose, UA: NEGATIVE
Hgb urine dipstick: NEGATIVE
Ketones, ur: NEGATIVE
Leukocytes,Ua: NEGATIVE
Nitrite: NEGATIVE
Protein, ur: NEGATIVE
Specific Gravity, Urine: 1.008 (ref 1.001–1.035)
pH: 6.5 (ref 5.0–8.0)

## 2022-07-03 LAB — HEMOGLOBIN A1C
Hgb A1c MFr Bld: 6.1 % of total Hgb — ABNORMAL HIGH (ref <5.7)
Mean Plasma Glucose: 128 mg/dL
eAG (mmol/L): 7.1 mmol/L

## 2022-07-03 LAB — MICROALBUMIN / CREATININE URINE RATIO
Creatinine, Urine: 37 mg/dL (ref 20–275)
Microalb Creat Ratio: 24 mcg/mg creat (ref <30)
Microalb, Ur: 0.9 mg/dL

## 2022-07-03 LAB — INSULIN, RANDOM: Insulin: 11.2 u[IU]/mL

## 2022-07-03 LAB — MAGNESIUM: Magnesium: 2 mg/dL (ref 1.5–2.5)

## 2022-07-03 LAB — TSH: TSH: 0.89 mIU/L (ref 0.40–4.50)

## 2022-07-03 LAB — VITAMIN D 25 HYDROXY (VIT D DEFICIENCY, FRACTURES): Vit D, 25-Hydroxy: 79 ng/mL (ref 30–100)

## 2022-07-05 DIAGNOSIS — C44222 Squamous cell carcinoma of skin of right ear and external auricular canal: Secondary | ICD-10-CM | POA: Diagnosis not present

## 2022-07-05 DIAGNOSIS — L821 Other seborrheic keratosis: Secondary | ICD-10-CM | POA: Diagnosis not present

## 2022-07-05 DIAGNOSIS — L578 Other skin changes due to chronic exposure to nonionizing radiation: Secondary | ICD-10-CM | POA: Diagnosis not present

## 2022-07-25 ENCOUNTER — Other Ambulatory Visit: Payer: Self-pay | Admitting: Nurse Practitioner

## 2022-07-25 DIAGNOSIS — I700 Atherosclerosis of aorta: Secondary | ICD-10-CM

## 2022-07-25 DIAGNOSIS — E782 Mixed hyperlipidemia: Secondary | ICD-10-CM

## 2022-07-25 DIAGNOSIS — N280 Ischemia and infarction of kidney: Secondary | ICD-10-CM

## 2022-07-30 ENCOUNTER — Other Ambulatory Visit: Payer: Self-pay | Admitting: Nurse Practitioner

## 2022-07-30 DIAGNOSIS — F419 Anxiety disorder, unspecified: Secondary | ICD-10-CM

## 2022-07-31 ENCOUNTER — Telehealth: Payer: Self-pay | Admitting: Nurse Practitioner

## 2022-07-31 DIAGNOSIS — H61001 Unspecified perichondritis of right external ear: Secondary | ICD-10-CM | POA: Diagnosis not present

## 2022-07-31 NOTE — Telephone Encounter (Signed)
Pt is needing a refill on Clonazepam to [facility]

## 2022-08-01 ENCOUNTER — Other Ambulatory Visit: Payer: Self-pay | Admitting: Nurse Practitioner

## 2022-08-01 DIAGNOSIS — F419 Anxiety disorder, unspecified: Secondary | ICD-10-CM

## 2022-08-01 MED ORDER — CLONAZEPAM 0.5 MG PO TABS: ORAL_TABLET | ORAL | 0 refills | Status: DC

## 2022-08-11 DIAGNOSIS — F411 Generalized anxiety disorder: Secondary | ICD-10-CM | POA: Diagnosis not present

## 2022-08-15 ENCOUNTER — Telehealth: Payer: Self-pay

## 2022-08-15 NOTE — Progress Notes (Unsigned)
Patient: Debra White    DOB: 12/06/1933  Chart review started. Reviewing office visits, consults, hospital visits, and medication changes. Chart review complete. Calling pt. to complete general review call. No answer, unable to LVM. VM box not set up.   Stop(3mn)  08/16/22 HC called pt to complete general review call. Both mobile and home, no answer unable to LVM.  08/17/22 HC called pt at [phone] [phone] pts sister states she will give pt message to return call.

## 2022-08-21 ENCOUNTER — Telehealth: Payer: Self-pay

## 2022-08-21 NOTE — Telephone Encounter (Signed)
Refill request for Clonazepam.

## 2022-08-22 DIAGNOSIS — H61001 Unspecified perichondritis of right external ear: Secondary | ICD-10-CM | POA: Diagnosis not present

## 2022-08-30 ENCOUNTER — Other Ambulatory Visit: Payer: Self-pay | Admitting: Nurse Practitioner

## 2022-08-30 DIAGNOSIS — F419 Anxiety disorder, unspecified: Secondary | ICD-10-CM

## 2022-08-30 MED ORDER — CLONAZEPAM 0.5 MG PO TABS: ORAL_TABLET | ORAL | 0 refills | Status: DC

## 2022-09-25 ENCOUNTER — Other Ambulatory Visit: Payer: Self-pay

## 2022-09-25 DIAGNOSIS — I10 Essential (primary) hypertension: Secondary | ICD-10-CM

## 2022-09-25 MED ORDER — OLMESARTAN MEDOXOMIL 40 MG PO TABS: ORAL_TABLET | ORAL | 3 refills | Status: DC

## 2022-09-27 ENCOUNTER — Other Ambulatory Visit: Payer: Self-pay | Admitting: Nurse Practitioner

## 2022-09-27 DIAGNOSIS — F419 Anxiety disorder, unspecified: Secondary | ICD-10-CM

## 2022-10-03 ENCOUNTER — Other Ambulatory Visit: Payer: Self-pay | Admitting: Internal Medicine

## 2022-10-03 DIAGNOSIS — Z1231 Encounter for screening mammogram for malignant neoplasm of breast: Secondary | ICD-10-CM

## 2022-10-18 ENCOUNTER — Other Ambulatory Visit: Payer: Self-pay | Admitting: Nurse Practitioner

## 2022-10-18 DIAGNOSIS — F419 Anxiety disorder, unspecified: Secondary | ICD-10-CM

## 2022-10-18 NOTE — Progress Notes (Addendum)
ANNUAL WELLNESS VISIT AND FOLLOW UP   Assessment:    Annual Medicare Wellness Visit Due annually  Health maintenance reviewed - declines tetanus, shingrix at this time   Tortuous aorta (HCC) Control blood pressure, cholesterol, glucose, increase exercise.   Renal infarct (HCC) Continue on elliquis  Atrial fibrillation with controlled ventricular response (HCC)/ thombophilia (HCC) Continue elliquis, she is rate controlled, continue follow up cardio No concerns with bleeding  Aortic atherosclerosis (HCC) - per CT 05/2020 Control blood pressure, cholesterol, glucose, increase exercise.   Chronic diastolic congestive heart failure (HCC) Monitor, no symptoms of fluid overload at this time, patient having weight loss, no swelling, no PND, orthopenia  Pulmonary emphysema, unspecified emphysema type (HCC) Per CXR; likely age related; denies sx; monitor  Mild malnutrition (HCC) Resume remeron PRN appetite if weights trending back down . Stressed importance of protein and high caloric foods ? Lactose intolerance, can't do boost/ensure, suggested she look for lactose/dairy free protein shake at grocery, suggestions given -     COMPLETE METABOLIC PANEL WITH GFR  Essential hypertension -     CBC with Differential/Platelet -     COMPLETE METABOLIC PANEL WITH GFR -     TSH - Microalbumin/creatinine ratio - Urine routine with reflex microscopic - continue medications, DASH diet, exercise and monitor at home. Call if greater than 130/80.   Coronary atherosclerosis due to lipid rich plaque Control blood pressure, cholesterol, glucose, increase exercise.   Vitamin D deficiency Continue supplement  Medication management -     Magnesium  Mixed hyperlipidemia -     Lipid panel check lipids decrease fatty foods increase activity.    History of embolic stroke Continue eliquis Control blood pressure, cholesterol, glucose, increase exercise.   Chronic post-traumatic headache, not  intractable Continue klonopin, try to limit use Gabapentin helpful  Atypical facial pain Gabapentin helpful   Hepatic steatosis Check labs, avoid tylenol, alcohol   Gastroesophageal reflux disease, unspecified whether esophagitis present Continue PPI/H2 blocker, diet discussed  Protein calorie malnutrition(HCC) Boost or Ensure daily Eat protein rich and calorie dense foods  Osteoporosis, unspecified osteoporosis type, unspecified pathological fracture presence  DEXA UTD 03/2021, osteoporosis with forearm T score 3.7 Continue Vit D and high calcium diet, weight bearing exercises encouraged  Abnormal glucose/ Prediabetes Discussed disease and risks Discussed diet/exercise, weight management  A1C q30m  B12 def On supplement, recheck levels   Anxiety Will d/c Clonazepam and start Buspar 5 mg 1/2 tab BID Monitor symptoms   Over 40 minutes of exam, counseling, chart review and critical decision making was performed Future Appointments  Date Time Provider Department Center  11/15/2022  9:40 AM [facility]  07/03/2023 10:00 AM Adela Glimpse, NP [facility] None  10/22/2023 10:00 AM Raynelle Dick, NP [facility] None       Subjective:  Debra White is a 89 y.o. female who presents for Medicare Annual Wellness Vist and 3 Month follow up.   She does have long standing history of clonazepam use  for anxiety.  Due to recent studies showing increase in dementia we have been attempting to wean our patients off Clonazepam. She takes 1/2 of 0.5 mg BID.   The GI is still requesting another colonoscopy due to previous problems. Has not been willing to schedule until back pain is improved.   Husband of 87 years(advanced dementia on Alzheimer's) passed . She is currently living alone but nephew lives beside her.  Very stressed because has not talked to her daughter  in over a year since her husband died.  Reports has POA and living will with lawyer, copies  requested.   She reports GERD is well controlled by omeprazole 20 mg daily. Uses bentyl occasionally for cramping and diarrhea.   She has residual face pain related to fall and zygomatic fracture with hardware implanted, manages pain with gabapentin 600 mg TID. She follows with Dr. Luiz Blare as needed.  Continues to have a lot of pain with her back and was given cortisone injections with Dr. Luiz Blare  BMI is Body mass index is 19.09 kg/m., she is working on diet, minimal intentional exercise was very limited as active . Uses remeron intermittently to help with appetite and maintain weight.  Wt Readings from Last 3 Encounters:  10/22/22 108 lb 9.6 oz (49.3 kg)  07/02/22 107 lb (48.5 kg)  06/05/22 108 lb (49 kg)   She had splenic and renal infarcts in 2018, history of CVA and on Eliquis 2.5 mg BID for Afib.  CAD, diastolic CHF, Echo 2019 EF 55-60% with severe LAE. Declines TEE and loop recorder, follows with cardiology Dr. Duke Salvia. She also has aortic atherosclerosis per CT 05/2020.    Her blood pressure has not been checking BP at home, continue Amlodipine 2.5 mg QD. today their BP is BP: 122/68 BP Readings from Last 3 Encounters:  10/22/22 122/68  07/02/22 130/76  06/05/22 118/70   She does not workout. She denies chest pain, shortness of breath, dizziness.   She is on cholesterol medication (lovastatin 20 mg daily) and denies myalgias at the current dose, had myalgias with higher doses. Her cholesterol is not at goal. The cholesterol last visit was:   Lab Results  Component Value Date   CHOL 192 07/02/2022   HDL 101 07/02/2022   LDLCALC 66 07/02/2022   TRIG 171 (H) 07/02/2022   CHOLHDL 1.9 07/02/2022   She has prediabetes.  has not been working on diet and exercise for prediabetes, and denies hyperglycemia, hypoglycemia , nausea, polydipsia, polyuria and visual disturbances. Last A1C in the office was:  Lab Results  Component Value Date   HGBA1C 6.1 (H) 07/02/2022   Last GFR: Lab  Results  Component Value Date   EGFR 65 07/02/2022   Patient is on Vitamin D supplement.   Lab Results  Component Value Date   VD25OH 79 07/02/2022   She has been on b12 supplemnt -  Lab Results  Component Value Date   VITAMINB12 1,705 (H) 02/23/2022     Medication Review:   Current Outpatient Medications (Cardiovascular):    amLODipine (NORVASC) 2.5 MG tablet, TAKE 1 TABLET BY MOUTH DAILY   atenolol (TENORMIN) 100 MG tablet, Take  1 tablet  Daily  for B                                           /                            TAKE                           BY                   MOUTH   lovastatin (MEVACOR) 20 MG tablet, TAKE 1 TABLET BY MOUTH EACH NIGHT AT BEDTIME  FOR CHOLESTEROL   olmesartan (BENICAR) 40 MG tablet, TAKE 1 TABLET BY MOUTH DAILY FOR BLOOD PRESSURE   Current Outpatient Medications (Analgesics):    acetaminophen (TYLENOL) 500 MG tablet, Take 1,000 mg by mouth every 8 (eight) hours as needed for headache.   Current Outpatient Medications (Hematological):    apixaban (ELIQUIS) 2.5 MG TABS tablet, Take  1 tablet  2 x /day (every 12 hours)  to Prevent Blood Clots   Cyanocobalamin (VITAMIN B 12 PO), Take 1 tablet by mouth daily.  Current Outpatient Medications (Other):    busPIRone (BUSPAR) 5 MG tablet, 1/2 tab twice a day   clobetasol cream (TEMOVATE) 0.05 %, Apply to rash on Right Ear   3 to 4 x /day   clonazePAM (KLONOPIN) 0.5 MG tablet, TAKE 1/2 TO 1 TABLET BY MOUTH 2 TO 3 TIMES A DAY ONLY IF NEEDED FOR ANXIETY OR PANIC ATTACK. LIMIT TO 5 DAYS A WEEK TO AVOID ADDICTION   conjugated estrogens (PREMARIN) vaginal cream, Place 1 Applicatorful vaginally See admin instructions. Apply one applicatorful vaginally once a week   diclofenac Sodium (VOLTAREN) 1 % GEL, APPLY 2 GRAMS TOPICALLY 3-4 TIMES DAILY AS NEEDED FOR PAIN   dicyclomine (BENTYL) 20 MG tablet, TAKE 1 TABLET BY MOUTH FOUR TIMES DAILY WITH MEALS AND BEDTIME FOR CRAMPING NAUSEA OR BLOATING   omeprazole (PRILOSEC)  40 MG capsule, Take  1 capsule Daily to Prevent Heartburn & Indigestion                           /                                        TAKE                           BY                       MOUTH   polyvinyl alcohol (LIQUIFILM TEARS) 1.4 % ophthalmic solution, Place 1 drop into both eyes 2 (two) times daily as needed for dry eyes.   triamcinolone (KENALOG) 0.1 % paste, Apply to Lip Ulcers 3 to 4 x/day   Vaginal Lubricant (REPLENS) GEL, Place vaginally daily.   Vitamin D-Vitamin K (VITAMIN K2-VITAMIN D3 PO), Take by mouth. 90 mcg of K2, 5,000 units of D3   gabapentin (NEURONTIN) 600 MG tablet, TAKE 1/2 TO 1 TABLET BY MOUTH 3 TIMES DAILY FOR FACIAL PAIN   Allergies  Allergen Reactions   Latex Hives    TONGUE HAD BLISTERS WHEN HAD DENTAL SURGERY   Amoxicillin Nausea And Vomiting   Aspirin Other (See Comments)    Gi upset   Barbiturates Other (See Comments)    Unknown reaction   Celebrex [Celecoxib] Other (See Comments)    Unknown reaction   Codeine Itching   Evista [Raloxifene] Other (See Comments)    Unknown reaction   Lipitor [Atorvastatin] Other (See Comments)    Unknown reaction   Morphine And Related Other (See Comments)    Unknown reaction   Oruvail [Ketoprofen] Other (See Comments)    Unknown reaction   Pantoprazole Diarrhea   Paraffin Other (See Comments)    Unknown reaction   Prochlorperazine Other (See Comments)    Uncontrolled shaking   Proloprim [Trimethoprim] Other (See Comments)  Unknown reaction   Venlafaxine Nausea Only   Vibra-Tab [Doxycycline] Other (See Comments)    Unknown reaction   Vioxx [Rofecoxib] Other (See Comments)    edema   Betadine [Povidone Iodine] Rash   Caffeine Palpitations    Current Problems (verified) Patient Active Problem List   Diagnosis Date Noted   B12 deficiency 10/20/2021   Thrombophilia (HCC) 10/20/2020   Anxiety 11/19/2019   Diastolic CHF (HCC) 06/29/2019   History of embolic stroke 06/29/2019   Atrial fibrillation  with controlled ventricular response (HCC) 03/12/2019   Coronary atherosclerosis due to lipid rich plaque 03/26/2018   Renal infarct (HCC) 03/22/2018   Chronic hyperkalemia 03/22/2018   Hepatic steatosis 11/17/2017   Aortic atherosclerosis (HCC) by Abd CT scan (05/2020)  08/08/2017   COPD (chronic obstructive pulmonary disease) with emphysema (HCC) 12/21/2015   Tortuous aorta (HCC) 12/21/2015   Osteoporosis 12/21/2015   Mild malnutrition (HCC) 05/11/2015   Medication management 01/27/2014   Atypical facial pain 10/08/2013   Vitamin D deficiency 07/08/2013   Hypertension    Hyperlipidemia    GERD    Headache, post-traumatic, chronic 09/20/2011    Screening Tests Immunization History  Administered Date(s) Administered   Influenza, High Dose Seasonal PF 03/25/2014, 04/05/2016, 06/02/2018, 03/12/2019, 03/23/2020, 05/02/2021, 06/05/2022   Influenza-Unspecified 03/09/2013, 03/11/2015   PFIZER(Purple Top)SARS-COV-2 Vaccination 06/01/2020, 06/22/2020   Pneumococcal Conjugate-13 12/21/2015   Pneumococcal Polysaccharide-23 06/25/2002   Td 06/26/2003   Health Maintenance  Topic Date Due   COVID-19 Vaccine (3 - Pfizer risk series) 11/07/2022 (Originally 07/20/2020)   Zoster Vaccines- Shingrix (1 of 2) 01/21/2023 (Originally 12/30/1952)   INFLUENZA VACCINE  01/24/2023   Medicare Annual Wellness (AWV)  10/22/2023   Pneumonia Vaccine 50+ Years old  Completed   DEXA SCAN  Completed   HPV VACCINES  Aged Out   DTaP/Tdap/Td  Discontinued   COLONOSCOPY (Pts 45-26yrs Insurance coverage will need to be confirmed)  Discontinued    Names of Other Physician/Practitioners you currently use: 1. [facility] here for primary care 2.  Eye Exam, Dr. Hyacinth Meeker 2022, has upcoming  3. Dentist 08/2021  Patient Care Team: Lucky Cowboy, MD as PCP - General (Internal Medicine) Thurmon Fair, MD as PCP - Cardiology (Cardiology) Mia Creek, MD as Consulting  Physician (Ophthalmology) Meryl Dare, MD as Consulting Physician (Gastroenterology) Van Clines, MD as Consulting Physician (Neurology) Flo Shanks, MD as Consulting Physician (Otolaryngology) Lajoyce Corners, [facility] (Inactive) as Pharmacist (Pharmacist)  SURGICAL HISTORY She  has a past surgical history that includes Abdominal hysterectomy; Hand surgery; Shoulder arthroscopy w/ rotator cuff repair; Cervical disc surgery; Back surgery; ORIF tripod fracture (07/13/2011); Colonoscopy; Facial fracture surgery; Back surgery; Ptosis repair (Bilateral, 11/22/2016); and Breast excisional biopsy. FAMILY HISTORY Her family history includes Breast cancer (age of onset: 54) in her sister; Cancer in her sister, sister, and sister; Colon cancer in her sister; Heart attack in her father; Heart disease in her father, mother, and sister; Hyperlipidemia in her daughter; Hypertension in her daughter. SOCIAL HISTORY She  reports that she has never smoked. She has never been exposed to tobacco smoke. She has never used smokeless tobacco. She reports that she does not drink alcohol and does not use drugs.   MEDICARE WELLNESS OBJECTIVES: Physical activity: Current Exercise Habits: The patient does not participate in regular exercise at present, Exercise limited by: orthopedic condition(s);psychological condition(s);cardiac condition(s) Cardiac risk factors: Cardiac Risk Factors include: advanced age (>59men, >3 women);dyslipidemia;hypertension;sedentary lifestyle Depression/mood screen:  10/22/2022   10:46 AM  Depression screen PHQ 2/9  Decreased Interest 1  Down, Depressed, Hopeless 0  PHQ - 2 Score 1    ADLs:     10/22/2022   10:47 AM 06/05/2022    2:03 PM  In your present state of health, do you have any difficulty performing the following activities:  Hearing? 1 0  Vision? 0 0  Difficulty concentrating or making decisions? 0 0  Walking or climbing stairs? 1 0  Dressing or bathing? 0 0   Doing errands, shopping? 0 0     Cognitive Testing  Alert? Yes  Normal Appearance?Yes  Oriented to person? Yes  Place? Yes   Time? Yes  Recall of three objects?  Yes  Can perform simple calculations? Yes  Displays appropriate judgment?Yes  Can read the correct time from a watch face?Yes  EOL planning: Does Patient Have a Medical Advance Directive?: Yes Type of Advance Directive: Healthcare Power of Attorney, Living will Does patient want to make changes to medical advance directive?: No - Patient declined Copy of Healthcare Power of Attorney in Chart?: No - copy requested     Review of Systems  Constitutional:  Negative for malaise/fatigue and weight loss.  HENT:  Positive for ear pain. Negative for hearing loss and tinnitus.   Eyes:  Negative for blurred vision and double vision.  Respiratory:  Negative for cough, sputum production, shortness of breath and wheezing.        Chronic facial pain, well controlled  Cardiovascular:  Negative for chest pain, palpitations, orthopnea, claudication, leg swelling and PND.  Gastrointestinal:  Negative for abdominal pain, blood in stool, constipation, diarrhea (intermittent), heartburn, melena, nausea and vomiting.  Genitourinary: Negative.   Musculoskeletal:  Positive for back pain and joint pain. Negative for falls and myalgias.  Skin:  Negative for rash.  Neurological:  Negative for dizziness, tingling, sensory change, weakness and headaches.  Endo/Heme/Allergies:  Negative for polydipsia.  Psychiatric/Behavioral:  Negative for depression, memory loss, substance abuse and suicidal ideas. The patient is nervous/anxious. The patient does not have insomnia.   All other systems reviewed and are negative.    Objective:     Today's Vitals   10/22/22 0952  BP: 122/68  Pulse: 74  Temp: (!) 97.5 F (36.4 C)  SpO2: 97%  Weight: 108 lb 9.6 oz (49.3 kg)  Height: 5' 3.25" (1.607 m)   Body mass index is 19.09 kg/m.  General  appearance: alert, no distress, WD/WN, thin elderly female , crying spells during visit HEENT: normocephalic, sclerae anicteric, TMs pearly right, left cerumen impaction, nares patent, no discharge or erythema, pharynx normal. Very HOH no hearing aids today.  Oral cavity: MMM, no lesions Neck: supple, no lymphadenopathy, no thyromegaly, no masses Heart: RRR, normal S1, S2, no murmurs Lungs: CTA bilaterally, no wheezes, rhonchi, or rales Abdomen: +bs, soft, diffuse tenderness, non distended, no masses, no hepatomegaly, no splenomegaly Musculoskeletal: nontender, no swelling, no obvious deformity Extremities: no edema, no cyanosis, no clubbing Pulses: 2+ symmetric, upper and lower extremities, normal cap refill Neurological: alert, oriented x 3, CN2-12 intact, strength normal upper extremities and lower extremities, sensation normal throughout, DTRs 2+ throughout, no cerebellar signs, gait normal  Psychiatric: depressed/anxious affect, behavior normal, pleasant      Medicare Attestation I have personally reviewed: The patient's medical and social history Their use of alcohol, tobacco or illicit drugs Their current medications and supplements The patient's functional ability including ADLs,fall risks, home safety risks, cognitive, and hearing and  visual impairment Diet and physical activities Evidence for depression or mood disorders  The patient's weight, height, BMI, and visual acuity have been recorded in the chart.  I have made referrals, counseling, and provided education to the patient based on review of the above and I have provided the patient with a written personalized care plan for preventive services.      Raynelle Dick, NP   06/16/2019

## 2022-10-22 ENCOUNTER — Encounter: Payer: Self-pay | Admitting: Nurse Practitioner

## 2022-10-22 ENCOUNTER — Ambulatory Visit (INDEPENDENT_AMBULATORY_CARE_PROVIDER_SITE_OTHER): Payer: PPO | Admitting: Nurse Practitioner

## 2022-10-22 VITALS — BP 122/68 | HR 74 | Temp 97.5°F | Ht 63.25 in | Wt 108.6 lb

## 2022-10-22 DIAGNOSIS — I771 Stricture of artery: Secondary | ICD-10-CM

## 2022-10-22 DIAGNOSIS — R6889 Other general symptoms and signs: Secondary | ICD-10-CM

## 2022-10-22 DIAGNOSIS — I4891 Unspecified atrial fibrillation: Secondary | ICD-10-CM

## 2022-10-22 DIAGNOSIS — M810 Age-related osteoporosis without current pathological fracture: Secondary | ICD-10-CM | POA: Diagnosis not present

## 2022-10-22 DIAGNOSIS — I5032 Chronic diastolic (congestive) heart failure: Secondary | ICD-10-CM | POA: Diagnosis not present

## 2022-10-22 DIAGNOSIS — N280 Ischemia and infarction of kidney: Secondary | ICD-10-CM | POA: Diagnosis not present

## 2022-10-22 DIAGNOSIS — E440 Moderate protein-calorie malnutrition: Secondary | ICD-10-CM

## 2022-10-22 DIAGNOSIS — E782 Mixed hyperlipidemia: Secondary | ICD-10-CM

## 2022-10-22 DIAGNOSIS — K219 Gastro-esophageal reflux disease without esophagitis: Secondary | ICD-10-CM | POA: Diagnosis not present

## 2022-10-22 DIAGNOSIS — F419 Anxiety disorder, unspecified: Secondary | ICD-10-CM | POA: Diagnosis not present

## 2022-10-22 DIAGNOSIS — I700 Atherosclerosis of aorta: Secondary | ICD-10-CM

## 2022-10-22 DIAGNOSIS — G500 Trigeminal neuralgia: Secondary | ICD-10-CM

## 2022-10-22 DIAGNOSIS — Z79899 Other long term (current) drug therapy: Secondary | ICD-10-CM | POA: Diagnosis not present

## 2022-10-22 DIAGNOSIS — R7309 Other abnormal glucose: Secondary | ICD-10-CM

## 2022-10-22 DIAGNOSIS — I2583 Coronary atherosclerosis due to lipid rich plaque: Secondary | ICD-10-CM

## 2022-10-22 DIAGNOSIS — E538 Deficiency of other specified B group vitamins: Secondary | ICD-10-CM | POA: Diagnosis not present

## 2022-10-22 DIAGNOSIS — E441 Mild protein-calorie malnutrition: Secondary | ICD-10-CM

## 2022-10-22 DIAGNOSIS — E559 Vitamin D deficiency, unspecified: Secondary | ICD-10-CM

## 2022-10-22 DIAGNOSIS — I2510 Atherosclerotic heart disease of native coronary artery without angina pectoris: Secondary | ICD-10-CM | POA: Diagnosis not present

## 2022-10-22 DIAGNOSIS — Z0001 Encounter for general adult medical examination with abnormal findings: Secondary | ICD-10-CM | POA: Diagnosis not present

## 2022-10-22 DIAGNOSIS — I10 Essential (primary) hypertension: Secondary | ICD-10-CM | POA: Diagnosis not present

## 2022-10-22 DIAGNOSIS — J439 Emphysema, unspecified: Secondary | ICD-10-CM

## 2022-10-22 LAB — CBC WITH DIFFERENTIAL/PLATELET
HCT: 40.2 % (ref 35.0–45.0)
Lymphs Abs: 1831 cells/uL (ref 850–3900)
MCHC: 33.1 g/dL (ref 32.0–36.0)
MPV: 10.1 fL (ref 7.5–12.5)
Monocytes Relative: 8.6 %
RBC: 4.21 10*6/uL (ref 3.80–5.10)
WBC: 9.2 10*3/uL (ref 3.8–10.8)

## 2022-10-22 MED ORDER — GABAPENTIN 600 MG PO TABS
ORAL_TABLET | ORAL | 1 refills | Status: DC
Start: 2022-10-22 — End: 2023-07-29

## 2022-10-22 MED ORDER — BUSPIRONE HCL 5 MG PO TABS
ORAL_TABLET | ORAL | 0 refills | Status: DC
Start: 2022-10-22 — End: 2022-10-26

## 2022-10-22 NOTE — Patient Instructions (Addendum)
Stop Clonazepam and start buspirone 5 mg 1/2 tab in morning and night   Managing Anxiety, Adult After being diagnosed with anxiety, you may be relieved to know why you have felt or behaved a certain way. You may also feel overwhelmed about the treatment ahead and what it will mean for your life. With care and support, you can manage your anxiety. How to manage lifestyle changes Understanding the difference between stress and a nxiety Although stress can play a role in anxiety, it is not the same as anxiety. Stress is your body's reaction to life changes and events, both good and bad. Stress is often caused by something external, such as a deadline, test, or competition. It normally goes away after the event has ended and will last just a few hours. But, stress can be ongoing and can lead to more than just stress. Anxiety is caused by something internal, such as imagining a terrible outcome or worrying that something will go wrong that will greatly upset you. Anxiety often does not go away even after the event is over, and it can become a long-term (chronic) worry. Lowering stress and anxiety Talk with your health care provider or a counselor to learn more about lowering anxiety and stress. They may suggest tension-reduction techniques, such as: Music. Spend time creating or listening to music that you enjoy and that inspires you. Mindfulness-based meditation. Practice being aware of your normal breaths while not trying to control your breathing. It can be done while sitting or walking. Centering prayer. Focus on a word, phrase, or sacred image that means something to you and brings you peace. Deep breathing. Expand your stomach and inhale slowly through your nose. Hold your breath for 3-5 seconds. Then breathe out slowly, letting your stomach muscles relax. Self-talk. Learn to notice and spot thought patterns that lead to anxiety reactions. Change those patterns to thoughts that feel  peaceful. Muscle relaxation. Take time to tense muscles and then relax them. Choose a tension-reduction technique that fits your lifestyle and personality. These techniques take time and practice. Set aside 5-15 minutes a day to do them. Specialized therapists can offer counseling and training in these techniques. The training to help with anxiety may be covered by some insurance plans. Other things you can do to manage stress and anxiety include: Keeping a stress diary. This can help you learn what triggers your reaction and then learn ways to manage your response. Thinking about how you react to certain situations. You may not be able to control everything, but you can control your response. Making time for activities that help you relax and not feeling guilty about spending your time in this way. Doing visual imagery. This involves imagining or creating mental pictures to help you relax. Practicing yoga. Through yoga poses, you can lower tension and relax.  Medicines Medicines for anxiety include: Antidepressant medicines. These are usually prescribed for long-term daily control. Anti-anxiety medicines. These may be added in severe cases, especially when panic attacks occur. When used together, medicines, psychotherapy, and tension-reduction techniques may be the most effective treatment. Relationships Relationships can play a big part in helping you recover. Spend more time connecting with trusted friends and family members. Think about going to couples counseling if you have a partner, taking family education classes, or going to family therapy. Therapy can help you and others better understand your anxiety. How to recognize changes in your anxiety Everyone responds differently to treatment for anxiety. Recovery from anxiety happens when symptoms lessen  and stop interfering with your daily life at home or work. This may mean that you will start to: Have better concentration and focus. Worry  will interfere less in your daily thinking. Sleep better. Be less irritable. Have more energy. Have improved memory. Try to recognize when your condition is getting worse. Contact your provider if your symptoms interfere with home or work and you feel like your condition is not improving. Follow these instructions at home: Activity Exercise. Adults should: Exercise for at least 150 minutes each week. The exercise should increase your heart rate and make you sweat (moderate-intensity exercise). Do strengthening exercises at least twice a week. Get the right amount and quality of sleep. Most adults need 7-9 hours of sleep each night. Lifestyle  Eat a healthy diet that includes plenty of vegetables, fruits, whole grains, low-fat dairy products, and lean protein. Do not eat a lot of foods that are high in fats, added sugars, or salt (sodium). Make choices that simplify your life. Do not use any products that contain nicotine or tobacco. These products include cigarettes, chewing tobacco, and vaping devices, such as e-cigarettes. If you need help quitting, ask your provider. Avoid caffeine, alcohol, and certain over-the-counter cold medicines. These may make you feel worse. Ask your pharmacist which medicines to avoid. General instructions Take over-the-counter and prescription medicines only as told by your provider. Keep all follow-up visits. This is to make sure you are managing your anxiety well or if you need more support. Where to find support You can get help and support from: Self-help groups. Online and [employer]. A trusted spiritual leader. Couples counseling. Family education classes. Family therapy. Where to find more information You may find that joining a support group helps you deal with your anxiety. The following sources can help you find counselors or support groups near you: Mental Health America: mentalhealthamerica.net Anxiety and Depression Association  of [place] (ADAA): adaa.org The [employer] on Mental Illness (NAMI): nami.org Contact a health care provider if: You have a hard time staying focused or finishing tasks. You spend many hours a day feeling worried about everyday life. You are very tired because you cannot stop worrying. You start to have headaches or often feel tense. You have chronic nausea or diarrhea. Get help right away if: Your heart feels like it is racing. You have shortness of breath. You have thoughts of hurting yourself or others. Get help right away if you feel like you may hurt yourself or others, or have thoughts about taking your own life. Go to your nearest emergency room or: Call 911. Call the National Suicide Prevention Lifeline at 209-654-4933 or 988. This is open 24 hours a day. Text the Crisis Text Line at (671)762-8294. This information is not intended to replace advice given to you by your health care provider. Make sure you discuss any questions you have with your health care provider. Document Revised: 03/20/2022 Document Reviewed: 10/02/2020 Elsevier Patient Education  2023 [employer].

## 2022-10-23 LAB — CBC WITH DIFFERENTIAL/PLATELET
Absolute Monocytes: 791 cells/uL (ref 200–950)
Basophils Absolute: 64 cells/uL (ref 0–200)
Basophils Relative: 0.7 %
Eosinophils Absolute: 212 cells/uL (ref 15–500)
Eosinophils Relative: 2.3 %
Hemoglobin: 13.3 g/dL (ref 11.7–15.5)
MCH: 31.6 pg (ref 27.0–33.0)
MCV: 95.5 fL (ref 80.0–100.0)
Neutro Abs: 6302 cells/uL (ref 1500–7800)
Neutrophils Relative %: 68.5 %
Platelets: 242 10*3/uL (ref 140–400)
RDW: 11.9 % (ref 11.0–15.0)
Total Lymphocyte: 19.9 %

## 2022-10-23 LAB — LIPID PANEL
Cholesterol: 177 mg/dL (ref <200)
HDL: 92 mg/dL (ref >=50)
LDL Cholesterol (Calc): 61 mg/dL (calc)
Non-HDL Cholesterol (Calc): 85 mg/dL (calc) (ref <130)
Total CHOL/HDL Ratio: 1.9 (calc) (ref <5.0)
Triglycerides: 166 mg/dL — ABNORMAL HIGH (ref <150)

## 2022-10-23 LAB — COMPLETE METABOLIC PANEL WITH GFR
AG Ratio: 1.7 (calc) (ref 1.0–2.5)
ALT: 12 U/L (ref 6–29)
AST: 16 U/L (ref 10–35)
Albumin: 4.9 g/dL (ref 3.6–5.1)
Alkaline phosphatase (APISO): 88 U/L (ref 37–153)
BUN: 17 mg/dL (ref 7–25)
CO2: 32 mmol/L (ref 20–32)
Calcium: 10 mg/dL (ref 8.6–10.4)
Chloride: 96 mmol/L — ABNORMAL LOW (ref 98–110)
Creat: 0.71 mg/dL (ref 0.60–0.95)
Globulin: 2.9 g/dL (calc) (ref 1.9–3.7)
Glucose, Bld: 101 mg/dL — ABNORMAL HIGH (ref 65–99)
Potassium: 4.3 mmol/L (ref 3.5–5.3)
Sodium: 135 mmol/L (ref 135–146)
Total Bilirubin: 0.4 mg/dL (ref 0.2–1.2)
Total Protein: 7.8 g/dL (ref 6.1–8.1)
eGFR: 83 mL/min/{1.73_m2} (ref >=60)

## 2022-10-23 LAB — TSH: TSH: 0.93 mIU/L (ref 0.40–4.50)

## 2022-10-23 LAB — MAGNESIUM: Magnesium: 1.9 mg/dL (ref 1.5–2.5)

## 2022-10-23 NOTE — Progress Notes (Signed)
Continue to monitor and if has visual changes, worsening of headache pain- go to ER

## 2022-10-24 ENCOUNTER — Telehealth: Payer: Self-pay | Admitting: Nurse Practitioner

## 2022-10-24 NOTE — Telephone Encounter (Signed)
Pt said that the new medication she was given for her anxiety is making her sick on her stomach. She wants to know if she should stay with it for a bit or stop taking it ?

## 2022-10-24 NOTE — Telephone Encounter (Signed)
I would recommend she continue 1/2 tab and take with food

## 2022-10-26 ENCOUNTER — Other Ambulatory Visit: Payer: Self-pay | Admitting: Nurse Practitioner

## 2022-10-26 ENCOUNTER — Encounter (HOSPITAL_COMMUNITY): Payer: Self-pay

## 2022-10-26 ENCOUNTER — Emergency Department (HOSPITAL_COMMUNITY)
Admission: EM | Admit: 2022-10-26 | Discharge: 2022-10-26 | Disposition: A | Discharge status: Home / Self Care | Payer: PPO | Attending: Emergency Medicine | Admitting: Emergency Medicine

## 2022-10-26 ENCOUNTER — Emergency Department (HOSPITAL_COMMUNITY): Payer: PPO

## 2022-10-26 ENCOUNTER — Other Ambulatory Visit: Payer: Self-pay

## 2022-10-26 DIAGNOSIS — R000 Tachycardia, unspecified: Secondary | ICD-10-CM | POA: Diagnosis not present

## 2022-10-26 DIAGNOSIS — Z79899 Other long term (current) drug therapy: Secondary | ICD-10-CM | POA: Diagnosis not present

## 2022-10-26 DIAGNOSIS — G4489 Other headache syndrome: Secondary | ICD-10-CM | POA: Diagnosis not present

## 2022-10-26 DIAGNOSIS — R0602 Shortness of breath: Secondary | ICD-10-CM | POA: Insufficient documentation

## 2022-10-26 DIAGNOSIS — Z91040 Latex allergy status: Secondary | ICD-10-CM | POA: Insufficient documentation

## 2022-10-26 DIAGNOSIS — R002 Palpitations: Secondary | ICD-10-CM | POA: Insufficient documentation

## 2022-10-26 DIAGNOSIS — Z7901 Long term (current) use of anticoagulants: Secondary | ICD-10-CM | POA: Diagnosis not present

## 2022-10-26 DIAGNOSIS — I4891 Unspecified atrial fibrillation: Secondary | ICD-10-CM | POA: Diagnosis not present

## 2022-10-26 DIAGNOSIS — I509 Heart failure, unspecified: Secondary | ICD-10-CM | POA: Diagnosis not present

## 2022-10-26 DIAGNOSIS — R0789 Other chest pain: Secondary | ICD-10-CM | POA: Diagnosis not present

## 2022-10-26 DIAGNOSIS — F13930 Sedative, hypnotic or anxiolytic use, unspecified with withdrawal, uncomplicated: Secondary | ICD-10-CM | POA: Diagnosis not present

## 2022-10-26 DIAGNOSIS — R7989 Other specified abnormal findings of blood chemistry: Secondary | ICD-10-CM | POA: Diagnosis not present

## 2022-10-26 DIAGNOSIS — F419 Anxiety disorder, unspecified: Secondary | ICD-10-CM

## 2022-10-26 DIAGNOSIS — R079 Chest pain, unspecified: Secondary | ICD-10-CM | POA: Diagnosis not present

## 2022-10-26 DIAGNOSIS — I110 Hypertensive heart disease with heart failure: Secondary | ICD-10-CM | POA: Insufficient documentation

## 2022-10-26 DIAGNOSIS — I482 Chronic atrial fibrillation, unspecified: Secondary | ICD-10-CM | POA: Diagnosis present

## 2022-10-26 DIAGNOSIS — F13939 Sedative, hypnotic or anxiolytic use, unspecified with withdrawal, unspecified: Secondary | ICD-10-CM

## 2022-10-26 DIAGNOSIS — R1084 Generalized abdominal pain: Secondary | ICD-10-CM | POA: Diagnosis not present

## 2022-10-26 DIAGNOSIS — Z8673 Personal history of transient ischemic attack (TIA), and cerebral infarction without residual deficits: Secondary | ICD-10-CM | POA: Insufficient documentation

## 2022-10-26 LAB — CBC WITH DIFFERENTIAL/PLATELET
Abs Immature Granulocytes: 0.02 10*3/uL (ref 0.00–0.07)
Basophils Absolute: 0 10*3/uL (ref 0.0–0.1)
Basophils Relative: 1 %
Eosinophils Absolute: 0.1 10*3/uL (ref 0.0–0.5)
Eosinophils Relative: 2 %
HCT: 38.7 % (ref 36.0–46.0)
Hemoglobin: 13 g/dL (ref 12.0–15.0)
Immature Granulocytes: 0 %
Lymphocytes Relative: 23 %
Lymphs Abs: 1.4 10*3/uL (ref 0.7–4.0)
MCH: 31.9 pg (ref 26.0–34.0)
MCHC: 33.6 g/dL (ref 30.0–36.0)
MCV: 95.1 fL (ref 80.0–100.0)
Monocytes Absolute: 0.6 10*3/uL (ref 0.1–1.0)
Monocytes Relative: 10 %
Neutro Abs: 4.1 10*3/uL (ref 1.7–7.7)
Neutrophils Relative %: 64 %
Platelets: 209 10*3/uL (ref 150–400)
RBC: 4.07 MIL/uL (ref 3.87–5.11)
RDW: 12.3 % (ref 11.5–15.5)
WBC: 6.3 10*3/uL (ref 4.0–10.5)
nRBC: 0 % (ref 0.0–0.2)

## 2022-10-26 LAB — URINALYSIS, ROUTINE W REFLEX MICROSCOPIC
Bacteria, UA: NONE SEEN
Bilirubin Urine: NEGATIVE
Glucose, UA: NEGATIVE mg/dL
Hgb urine dipstick: NEGATIVE
Ketones, ur: NEGATIVE mg/dL
Leukocytes,Ua: NEGATIVE
Nitrite: NEGATIVE
Protein, ur: 30 mg/dL — AB
Specific Gravity, Urine: 1.008 (ref 1.005–1.030)
pH: 7 (ref 5.0–8.0)

## 2022-10-26 LAB — TROPONIN I (HIGH SENSITIVITY)
Troponin I (High Sensitivity): 7 ng/L (ref <18)
Troponin I (High Sensitivity): 12 ng/L (ref <18)

## 2022-10-26 LAB — COMPREHENSIVE METABOLIC PANEL
ALT: 20 U/L (ref 0–44)
AST: 29 U/L (ref 15–41)
Albumin: 4.5 g/dL (ref 3.5–5.0)
Alkaline Phosphatase: 73 U/L (ref 38–126)
Anion gap: 14 (ref 5–15)
BUN: 11 mg/dL (ref 8–23)
CO2: 24 mmol/L (ref 22–32)
Calcium: 9.7 mg/dL (ref 8.9–10.3)
Chloride: 96 mmol/L — ABNORMAL LOW (ref 98–111)
Creatinine, Ser: 0.8 mg/dL (ref 0.44–1.00)
GFR, Estimated: >60 mL/min (ref >60)
Glucose, Bld: 124 mg/dL — ABNORMAL HIGH (ref 70–99)
Potassium: 3.5 mmol/L (ref 3.5–5.1)
Sodium: 134 mmol/L — ABNORMAL LOW (ref 135–145)
Total Bilirubin: 0.8 mg/dL (ref 0.3–1.2)
Total Protein: 7.6 g/dL (ref 6.5–8.1)

## 2022-10-26 LAB — BRAIN NATRIURETIC PEPTIDE: B Natriuretic Peptide: 347 pg/mL — ABNORMAL HIGH (ref 0.0–100.0)

## 2022-10-26 LAB — MAGNESIUM: Magnesium: 1.7 mg/dL (ref 1.7–2.4)

## 2022-10-26 MED ORDER — ATENOLOL 25 MG PO TABS
100.0000 mg | ORAL_TABLET | Freq: Once | ORAL | Status: AC
  Administered 2022-10-26: 100 mg via ORAL
  Filled 2022-10-26: qty 4

## 2022-10-26 MED ORDER — CLONAZEPAM 0.5 MG PO TABS
0.5000 mg | ORAL_TABLET | Freq: Once | ORAL | Status: AC
  Administered 2022-10-26: 0.5 mg via ORAL
  Filled 2022-10-26: qty 1

## 2022-10-26 MED ORDER — APIXABAN 2.5 MG PO TABS
2.5000 mg | ORAL_TABLET | Freq: Once | ORAL | Status: AC
  Administered 2022-10-26: 2.5 mg via ORAL
  Filled 2022-10-26: qty 1

## 2022-10-26 MED ORDER — CLONAZEPAM 0.5 MG PO TABS
ORAL_TABLET | ORAL | 0 refills | Status: DC
Start: 2022-10-26 — End: 2022-11-01

## 2022-10-26 MED ORDER — ACETAMINOPHEN 325 MG PO TABS
650.0000 mg | ORAL_TABLET | Freq: Once | ORAL | Status: AC
  Administered 2022-10-26: 650 mg via ORAL
  Filled 2022-10-26: qty 2

## 2022-10-26 MED ORDER — DILTIAZEM HCL-DEXTROSE 125-5 MG/125ML-% IV SOLN (PREMIX)
5.0000 mg/h | INTRAVENOUS | Status: DC
  Administered 2022-10-26: 5 mg/h via INTRAVENOUS
  Filled 2022-10-26: qty 125

## 2022-10-26 MED ORDER — ONDANSETRON HCL 4 MG/2ML IJ SOLN
4.0000 mg | Freq: Once | INTRAMUSCULAR | Status: AC
  Administered 2022-10-26: 4 mg via INTRAVENOUS
  Filled 2022-10-26: qty 2

## 2022-10-26 NOTE — ED Triage Notes (Signed)
Patient BIB EMS from home with complaint of uncontrolled a fib.   Patient c/o headache, lip numbness, abdominal pain, SOB, chest pain. Multiple symptom ongoing for multiple years.  Patient reports heart was racing.   Patient anxious with EMS.   EMS administered 20mg  Cardizem  PMHX: Afib Anxiety

## 2022-10-26 NOTE — ED Provider Notes (Signed)
[place] EMERGENCY DEPARTMENT AT [facility] Provider Note   CSN: [id number] Arrival date & time: 10/26/22  0600     History  Chief Complaint  Patient presents with   Tachycardia    Debra White is a 89 y.o. female.  The history is provided by the patient, the EMS personnel and medical records.  Debra White is a 89 y.o. female who presents to the Emergency Department complaining of chest discomfort.  She presents to the emergency department by EMS for evaluation of pressure in her chest and difficulty breathing that kept her from sleeping throughout the night.  He is unclear exactly when her symptoms began.  No fever.  She reports an occasional cough that is unchanged from baseline.  She did vomit once and had some epigastric pain.  No leg swelling or pain.  No hematochezia or melena.  She does have occasional dysuria but this is an ongoing issue.  She reports compliance with her home medications but is unsure what they are.  EMS reports afib with rate in the 160s, received cardizem 20mg .   Past medical history significant for atrial fibrillation on anticoagulation, CHF, emphysema, hypertension, renal infarct, CVA     Home Medications Prior to Admission medications   Medication Sig Start Date End Date Taking? Authorizing Provider  acetaminophen (TYLENOL) 500 MG tablet Take 1,000 mg by mouth every 8 (eight) hours as needed for headache.     [provider]  amLODipine (NORVASC) 2.5 MG tablet TAKE 1 TABLET BY MOUTH DAILY 03/20/22   Croitoru, Rachelle Hora, MD  apixaban (ELIQUIS) 2.5 MG TABS tablet Take  1 tablet  2 x /day (every 12 hours)  to Prevent Blood Clots 01/23/22   Lucky Cowboy, MD  atenolol (TENORMIN) 100 MG tablet Take  1 tablet  Daily  for B                                           /                            TAKE                           BY                   MOUTH 01/11/22   Lucky Cowboy, MD  busPIRone (BUSPAR) 5 MG tablet 1/2 tab twice a day 10/22/22    Raynelle Dick, NP  clobetasol cream (TEMOVATE) 0.05 % Apply to rash on Right Ear   3 to 4 x /day 06/05/22   Lucky Cowboy, MD  clonazePAM (KLONOPIN) 0.5 MG tablet TAKE 1/2 TO 1 TABLET BY MOUTH 2 TO 3 TIMES A DAY ONLY IF NEEDED FOR ANXIETY OR PANIC ATTACK. LIMIT TO 5 DAYS A WEEK TO AVOID ADDICTION 09/27/22   Raynelle Dick, NP  conjugated estrogens (PREMARIN) vaginal cream Place 1 Applicatorful vaginally See admin instructions. Apply one applicatorful vaginally once a week    [provider]  Cyanocobalamin (VITAMIN B 12 PO) Take 1 tablet by mouth daily.    [provider]  diclofenac Sodium (VOLTAREN) 1 % GEL APPLY 2 GRAMS TOPICALLY 3-4 TIMES DAILY AS NEEDED FOR PAIN 05/23/22   [name], PA-C  dicyclomine (BENTYL) 20 MG tablet TAKE  1 TABLET BY MOUTH FOUR TIMES DAILY WITH MEALS AND BEDTIME FOR CRAMPING NAUSEA OR BLOATING 06/20/22   Raynelle Dick, NP  gabapentin (NEURONTIN) 600 MG tablet TAKE 1/2 TO 1 TABLET BY MOUTH 3 TIMES DAILY FOR FACIAL PAIN 10/22/22   Raynelle Dick, NP  lovastatin (MEVACOR) 20 MG tablet TAKE 1 TABLET BY MOUTH EACH NIGHT AT BEDTIME FOR CHOLESTEROL 07/25/22   Adela Glimpse, NP  olmesartan (BENICAR) 40 MG tablet TAKE 1 TABLET BY MOUTH DAILY FOR BLOOD PRESSURE 09/25/22   Lucky Cowboy, MD  omeprazole (PRILOSEC) 40 MG capsule Take  1 capsule Daily to Prevent Heartburn & Indigestion                           /                                        TAKE                           BY                       MOUTH 02/05/22   Lucky Cowboy, MD  polyvinyl alcohol (LIQUIFILM TEARS) 1.4 % ophthalmic solution Place 1 drop into both eyes 2 (two) times daily as needed for dry eyes.    [provider]  triamcinolone (KENALOG) 0.1 % paste Apply to Lip Ulcers 3 to 4 x/day 06/05/22   Lucky Cowboy, MD  Vaginal Lubricant (REPLENS) GEL Place vaginally daily.    [provider]  Vitamin D-Vitamin K (VITAMIN K2-VITAMIN D3 PO) Take by mouth. 90  mcg of K2, 5,000 units of D3    [provider]      Allergies    Latex, Amoxicillin, Aspirin, Barbiturates, Celebrex [celecoxib], Codeine, Evista [raloxifene], Lipitor [atorvastatin], Morphine and related, Oruvail [ketoprofen], Pantoprazole, Paraffin, Prochlorperazine, Proloprim [trimethoprim], Venlafaxine, Vibra-tab [doxycycline], Vioxx [rofecoxib], Betadine [povidone iodine], and Caffeine    Review of Systems   Review of Systems  All other systems reviewed and are negative.   Physical Exam Updated Vital Signs Temp 98.5 F (36.9 C) (Oral)   SpO2 99%  Physical Exam Vitals and nursing note reviewed.  Constitutional:      Appearance: She is well-developed.  HENT:     Head: Normocephalic and atraumatic.  Cardiovascular:     Rate and Rhythm: Tachycardia present. Rhythm irregular.     Heart sounds: No murmur heard. Pulmonary:     Effort: Pulmonary effort is normal. No respiratory distress.     Breath sounds: Normal breath sounds.  Abdominal:     Palpations: Abdomen is soft.     Tenderness: There is no abdominal tenderness. There is no guarding or rebound.  Musculoskeletal:        General: No swelling or tenderness.  Skin:    General: Skin is warm and dry.  Neurological:     Mental Status: She is alert and oriented to person, place, and time.  Psychiatric:        Behavior: Behavior normal.     ED Results / Procedures / Treatments   Labs (all labs ordered are listed, but only abnormal results are displayed) Labs Reviewed - No data to display  EKG None  Radiology No results found.  Procedures Procedures    Medications Ordered in  ED Medications - No data to display  ED Course/ Medical Decision Making/ A&P                             Medical Decision Making Amount and/or Complexity of Data Reviewed Labs: ordered. Radiology: ordered.  Risk OTC drugs. Prescription drug management. Decision regarding hospitalization.   Patient with history of  atrial fibrillation here for evaluation of difficulty breathing, palpitations.  She is in A-fib with RVR at time of ED arrival.  She has already been treated with Cardizem load of 20 mg by EMS.  Will start on drip for rate control.  Patient care transferred pending labs and additional evaluation.        Final Clinical Impression(s) / ED Diagnoses Final diagnoses:  None    Rx / DC Orders ED Discharge Orders     None         Tilden Fossa, MD 10/26/22 [phone]

## 2022-10-26 NOTE — ED Provider Notes (Addendum)
  Physical Exam  BP (!) 147/103 (BP Location: Right Arm)   Pulse 77   Temp 98.5 F (36.9 C) (Oral)   Resp 20   SpO2 99%   Physical Exam  Procedures  .Critical Care  Performed by: Derwood Kaplan, MD Authorized by: Derwood Kaplan, MD   Critical care provider statement:    Critical care time (minutes):  32   Critical care was necessary to treat or prevent imminent or life-threatening deterioration of the following conditions:  Circulatory failure and toxidrome   Critical care was time spent personally by me on the following activities:  Development of treatment plan with patient or surrogate, discussions with consultants, evaluation of patient's response to treatment, examination of patient, ordering and review of laboratory studies, ordering and review of radiographic studies, ordering and performing treatments and interventions, pulse oximetry, re-evaluation of patient's condition and review of old charts   ED Course / MDM    Medical Decision Making Amount and/or Complexity of Data Reviewed Labs: ordered. Radiology: ordered.  Risk OTC drugs. Prescription drug management. Decision regarding hospitalization.   Pt presents with cc of shob, abd pain, headache, palpitations, chest pressure. She has hx of AF. Noted to be in AF with RVR. EMS gave her dilt bolus. She is on a drip now. Difficult historian - unable to ascertain if she has been taking all meds properly. Not a candidate for cardioversion. On sedative meds, buspar is new, clonazepam discontinued.  Benzo withdrawal contributing?  Will need admission.    Derwood Kaplan, MD 10/26/22 0729  8:49 AM Patient reassessed at 7:30 AM and again at 8:45 AM.  She continues to be on diltiazem drip.  Labs are overall reassuring. UA shows no signs of infection.  CMP reveals no renal failure.  Sodium is 134.  BNP slightly elevated at 347.  White count, hemoglobin normal.  Patient again reiterates that her clonazepam was  discontinued last week.  She has not been feeling well ever since.  I do suspect benzo withdrawal likely contributing to this episode.  I will request to the admitting team to focus on the benzo withdrawal element, given that they will take over her care within few minutes.  She is clinically stable.  She is having some pain all over her body described as burning pain and indigestion.  Stable for admission.    Derwood Kaplan, MD 10/26/22 0850  9:34 AM I called hospitalist team for admission, but at time they called me patient's heart rate has improved. She is no longer in A-fib with RVR. Sister at the bedside, states that every time they try to discontinue benzo, patient has episodes like this.  I do not think patient needs admission.  Dr. Ophelia Charter and I still discussed the case, as I was unaware that patient's RVR had resolved.  She will graciously see the patient, but anticipates discharge.  I discussed with her this benzo withdrawal concern.  It seems like PCP is actively trying to discontinue the benzo.  We might have to restart her on clonazepam.   Derwood Kaplan, MD 10/26/22 0935 \  12:28 PM I spoke with patient's primary care team.  They will see the patient next week.  They are comfortable with her starting her back on Klonopin.  Heart rate has remained stable.  Stable for discharge at this time.  Patient cleared by medical team as well.   Derwood Kaplan, MD 10/26/22 1228

## 2022-10-26 NOTE — Discharge Instructions (Addendum)
We are restarting your Klonopin.  Please do not take BuSpar. We suspect that some of your symptoms are because of withdrawals of your medication.  Restarting these medications will help.  Please see your primary care doctor next week to see if they want to taper you off of the medication slowly or continue the Klonopin.  You had atrial fibrillation with rapid ventricular response when he first came to the emergency room.  That has now resolved.  If you start having chest pain, shortness of breath, palpitations please return to the ER.

## 2022-10-26 NOTE — Consult Note (Signed)
ER Consult Note   Patient: Debra White ZOX:[id number] DOB: [birth year] DOA: 10/26/2022 DOS: the patient was seen and examined on 10/26/2022 PCP: Lucky Cowboy, MD  Patient coming from: Home - lives with alone, will stay with her sister; NOK: Sister   Chief Complaint: Tachycardia  HPI: VIANNEY COSTABILE is a 89 y.o. female with medical history significant of chronic combined CHF, C. Diff colitis, COPD, HTN, afib on Eliquis, and HLD presenting with tachycardia.   She doesn't feel well, her eyes and ears and lip and back and neck are hurting, kind of hurting all over.  She was burning all over.  It was just burning all over.  This morning, she woke up and wasn't feeling good.  Her heart was beating so fast and she called the ambulance.  Her stomach wasn't hurting, just felt bloated.  Her brother thought it was an anxiety attack "and I knowed it wasn't".  The nerve medication she was changed to (buspirone) was making her sick.  She stopped the Klonopin on Monday.  She had been on Klonopin for a long time.  They have taken her off the Klonopin in the past and she had a similar reaction.    ER Course:  Afib with RVR, not feeling well with diffuse pain, tingling sensation, CP/SOB.  Started on Dilt drip, no longer with tachycardia.  HR 75.  Many of symptoms likely due to BZD withdrawal.       Review of Systems: Diffusely positive, as noted above  Past Medical History:  Diagnosis Date   Acute on chronic combined systolic and diastolic CHF (congestive heart failure) (HCC) 06/29/2019   Anxiety    Arthritis    Blood transfusion    C. difficile colitis    Candida esophagitis (HCC) 2013   EGD   Cataract    Diverticulosis of colon (without mention of hemorrhage) 2007   Colonoscopy   Emphysema of lung (HCC)    Family history of colon cancer    sister   Fibromyalgia    GERD (gastroesophageal reflux disease)    Headache(784.0)    Hyperlipemia    Hypertension    Impingement syndrome of left  shoulder 09/25/2016   Impingement syndrome of right shoulder 09/25/2016   Iron deficiency anemia 05/31/2020   Kidney stones    Ptosis, bilateral    Renal infarct (HCC)    Splenic infarct    Past Surgical History:  Procedure Laterality Date   ABDOMINAL HYSTERECTOMY     BACK SURGERY     X2   BACK SURGERY     BREAST EXCISIONAL BIOPSY     CERVICAL DISC SURGERY     COLONOSCOPY     FACIAL FRACTURE SURGERY     HAND SURGERY     BIL    ORIF TRIPOD FRACTURE  07/13/2011   Procedure: OPEN REDUCTION INTERNAL FIXATION (ORIF) TRIPOD FRACTURE;  Surgeon: [name], MD;  Location: [facility] OR;  Service: ENT;  Laterality: Right;  ORIF RIGHT ZYGOMA, ORBITAL FLOOR EXPLORATION WITH FROST STITCH (TEMPORARY TARSORRHAPHY)   PTOSIS REPAIR Bilateral 11/22/2016   Procedure: INTERNAL PTOSIS REPAIR;  Surgeon: Floydene Flock, MD;  Location: [facility] OR;  Service: Ophthalmology;  Laterality: Bilateral;   SHOULDER ARTHROSCOPY W/ ROTATOR CUFF REPAIR     LFT   Social History:  reports that she has never smoked. She has never been exposed to tobacco smoke. She has never used smokeless tobacco. She reports that she does not drink alcohol and does not use  drugs.  Allergies  Allergen Reactions   Latex Hives    TONGUE HAD BLISTERS WHEN HAD DENTAL SURGERY   Amoxicillin Nausea And Vomiting   Aspirin Other (See Comments)    Gi upset   Barbiturates Other (See Comments)    Unknown reaction   Celebrex [Celecoxib] Other (See Comments)    Unknown reaction   Codeine Itching   Evista [Raloxifene] Other (See Comments)    Unknown reaction   Lipitor [Atorvastatin] Other (See Comments)    Unknown reaction   Morphine And Related Other (See Comments)    Unknown reaction   Oruvail [Ketoprofen] Other (See Comments)    Unknown reaction   Pantoprazole Diarrhea   Paraffin Other (See Comments)    Unknown reaction   Prochlorperazine Other (See Comments)    Uncontrolled shaking   Proloprim [Trimethoprim] Other (See Comments)     Unknown reaction   Venlafaxine Nausea Only   Vibra-Tab [Doxycycline] Other (See Comments)    Unknown reaction   Vioxx [Rofecoxib] Other (See Comments)    edema   Betadine [Povidone Iodine] Rash   Caffeine Palpitations    Family History  Problem Relation Age of Onset   Heart disease Mother    Heart disease Father    Heart attack Father    Breast cancer Sister 10   Colon cancer Sister    Cancer Sister        breast   Heart disease Sister    Cancer Sister        colon   Cancer Sister        colon   Hypertension Daughter    Hyperlipidemia Daughter     Prior to Admission medications   Medication Sig Start Date End Date Taking? Authorizing Provider  acetaminophen (TYLENOL) 500 MG tablet Take 1,000 mg by mouth every 8 (eight) hours as needed for headache.     [provider]  amLODipine (NORVASC) 2.5 MG tablet TAKE 1 TABLET BY MOUTH DAILY 03/20/22   Croitoru, Rachelle Hora, MD  apixaban (ELIQUIS) 2.5 MG TABS tablet Take  1 tablet  2 x /day (every 12 hours)  to Prevent Blood Clots 01/23/22   Lucky Cowboy, MD  atenolol (TENORMIN) 100 MG tablet Take  1 tablet  Daily  for B                                           /                            TAKE                           BY                   MOUTH 01/11/22   Lucky Cowboy, MD  busPIRone (BUSPAR) 5 MG tablet 1/2 tab twice a day 10/22/22   Raynelle Dick, NP  clobetasol cream (TEMOVATE) 0.05 % Apply to rash on Right Ear   3 to 4 x /day 06/05/22   Lucky Cowboy, MD  clonazePAM (KLONOPIN) 0.5 MG tablet TAKE 1/2 TO 1 TABLET BY MOUTH 2 TO 3 TIMES A DAY ONLY IF NEEDED FOR ANXIETY OR PANIC ATTACK. LIMIT TO 5 DAYS A WEEK TO AVOID ADDICTION 09/27/22   Raynelle Dick, NP  conjugated  estrogens (PREMARIN) vaginal cream Place 1 Applicatorful vaginally See admin instructions. Apply one applicatorful vaginally once a week    [provider]  Cyanocobalamin (VITAMIN B 12 PO) Take 1 tablet by mouth daily.    [provider]   diclofenac Sodium (VOLTAREN) 1 % GEL APPLY 2 GRAMS TOPICALLY 3-4 TIMES DAILY AS NEEDED FOR PAIN 05/23/22   [name], PA-C  dicyclomine (BENTYL) 20 MG tablet TAKE 1 TABLET BY MOUTH FOUR TIMES DAILY WITH MEALS AND BEDTIME FOR CRAMPING NAUSEA OR BLOATING 06/20/22   Raynelle Dick, NP  gabapentin (NEURONTIN) 600 MG tablet TAKE 1/2 TO 1 TABLET BY MOUTH 3 TIMES DAILY FOR FACIAL PAIN 10/22/22   Raynelle Dick, NP  lovastatin (MEVACOR) 20 MG tablet TAKE 1 TABLET BY MOUTH EACH NIGHT AT BEDTIME FOR CHOLESTEROL 07/25/22   Adela Glimpse, NP  olmesartan (BENICAR) 40 MG tablet TAKE 1 TABLET BY MOUTH DAILY FOR BLOOD PRESSURE 09/25/22   Lucky Cowboy, MD  omeprazole (PRILOSEC) 40 MG capsule Take  1 capsule Daily to Prevent Heartburn & Indigestion                           /                                        TAKE                           BY                       MOUTH 02/05/22   Lucky Cowboy, MD  polyvinyl alcohol (LIQUIFILM TEARS) 1.4 % ophthalmic solution Place 1 drop into both eyes 2 (two) times daily as needed for dry eyes.    [provider]  triamcinolone (KENALOG) 0.1 % paste Apply to Lip Ulcers 3 to 4 x/day 06/05/22   Lucky Cowboy, MD  Vaginal Lubricant (REPLENS) GEL Place vaginally daily.    [provider]  Vitamin D-Vitamin K (VITAMIN K2-VITAMIN D3 PO) Take by mouth. 90 mcg of K2, 5,000 units of D3    [provider]    Physical Exam: Vitals:   10/26/22 1200 10/26/22 1215 10/26/22 1230 10/26/22 1236  BP:    123/84  Pulse: 74 65 (!) 57 66  Resp: 19 18 18 18   Temp:    98.1 F (36.7 C)  TempSrc:    Oral  SpO2: 97% 97% 96% 98%   General:  Appears anxious, pan-positive ROS but in NAD, frail Eyes:  EOMI, normal lids, iris ENT:  grossly normal hearing, lips & tongue, mmm Neck:  no LAD, masses or thyromegaly Cardiovascular:  Irregular rate and rhythm without tachycardia, no m/r/g. No LE edema.  Respiratory:   CTA bilaterally with no  wheezes/rales/rhonchi.  Normal respiratory effort. Abdomen:  soft, NT, ND Skin:  no rash or induration seen on limited exam Musculoskeletal:  grossly normal tone BUE/BLE, good ROM, no bony abnormality Psychiatric:  anxious mood and affect, speech fluent and appropriate, AOx3 Neurologic:  CN 2-12 grossly intact, moves all extremities in coordinated fashion   Radiological Exams on Admission: Independently reviewed - see discussion in A/P where applicable  DG Chest Port 1 View  Result Date: 10/26/2022 CLINICAL DATA:  89 year old female with atrial fibrillation, chest pain, palpitations, shortness of breath.  EXAM: PORTABLE CHEST 1 VIEW COMPARISON:  Portable chest 12/27/2020 and earlier. FINDINGS: Portable AP upright view at 0636 hours. Improved lung volumes. Cardiomegaly appears stable since 2022. Calcified aortic atherosclerosis. Other mediastinal contours are within normal limits. Visualized tracheal air column is within normal limits. Allowing for portable technique the lungs are clear. No pneumothorax or pleural effusion. Chronic left lower neck surgical clips. Paucity of bowel gas. No acute osseous abnormality identified. IMPRESSION: 1. No acute cardiopulmonary abnormality.  Stable cardiomegaly. 2. Aortic Atherosclerosis (ICD10-I70.0). Electronically Signed   By: Odessa Fleming M.D.   On: 10/26/2022 07:12    EKG: Independently reviewed.  Afib with rate 125; nonspecific ST changes with no evidence of acute ischemia   Labs on Admission: I have personally reviewed the available labs and imaging studies at the time of the admission.  Pertinent labs:    Glucose 124 BNP 347 HS troponin 7 Normal CBC UA:  30 protein   Assessment and Plan: Principal Problem:   Benzodiazepine withdrawal without complication (HCC) Active Problems:   Chronic atrial fibrillation with RVR (HCC)    Benzodiazepine withdrawal -Patient with long-standing h/o anxiety -Has been on Klonopin for years but this was stopped  on Monday and she was given buspirone -Unable to tolerate buspirone -Today with diffusely positive ROS -Very likely BZD withdrawal -She was given a dose of Klonopin with improvement in symptoms -Recommend ongoing low-dose BZD therapy by PCP  Afib with RVR -Started on Cardizem drip and RVR resolved -Given a dose of atenolol -Based on normalized HR recommended ongoing outpatient care -Continue Eliquis      Thank you for this interesting consult.  Based on stabilization of symptoms, the patient does not appear to require acute hospitalization at this time.  Recommend ongoing discussion with PCP re: anxiolytic therapy and resumption of Klonopin at this time.   Author: Jonah Blue, MD 10/26/2022 1:14 PM  For on call review [email or web address].

## 2022-10-29 ENCOUNTER — Telehealth: Payer: Self-pay | Admitting: Pharmacist

## 2022-10-29 NOTE — Progress Notes (Unsigned)
HC reviewing patients chart prior to ED discharge call. Reviewed hospital visit notes and medication adherence/changes. Unable to lvm due to mailbox being full. ( )  HC connected with patient to complete ED discharge call. Heart was beating so fast that it scared patient. Patient states they think its from PCP stopping clonazepam and starting her on buspirone. Hospital discontinued buspirone and restarted clonazepam until she sees PCP on 11/01/2022. Patient is feeling better just weak. ( )

## 2022-10-31 NOTE — Patient Instructions (Signed)

## 2022-10-31 NOTE — Progress Notes (Signed)
Future Appointments  Date Time Provider Department  11/01/2022 11:30 AM Lucky Cowboy, MD [facility]  12/07/2022 10:30 AM Carlos Levering, NP [facility]  04/25/2023               6 mo ov 10:30 AM Lucky Cowboy, MD [facility]  07/03/2023                   cpe 10:00 AM Adela Glimpse, NP [facility]  10/22/2023                wellness 10:00 AM Raynelle Dick, NP [facility]    History of Present Illness:       The patient is a very nice 89 y.o. recently WWF with HTN, cAfib, HLD, Pre-Diabetes, Vitamin D Deficiency who was recently seen in the ER 5-6 days ago for what was felt due to an anxiety attack consequent of attempt to taper her off of Klonepin & transition her to Buspar. Acute cadriac event was ruled out & she was restarted back on Klonepin.    Current Outpatient Medications on File Prior to Visit  Medication Sig   acetaminophen (TYLENOL) 500 MG tablet Take 1,000 mg by mouth every 8 (eight) hours as needed for headache.    amLODipine (NORVASC) 2.5 MG tablet TAKE 1 TABLET  DAILY   apixaban (ELIQUIS) 2.5 MG TABS tablet Take  1 tablet  2 x /day    atenolol (TENORMIN) 100 MG tablet Take  1 tablet  Daily     clobetasol cream (TEMOVATE) 0.05 % Apply to rash on Right Ear   3 to 4 x /day (Patient taking differently: Apply 1 Application topically in the morning, at noon, in the evening, and at bedtime. Apply to rash on Right Ear   3 to 4 x /day)   clonazePAM (KLONOPIN) 0.5 MG tablet TAKE 1/2 TO 1 TABLET 2 TO 3 TIMES A DAY ONLY IF NEEDED FOR ANXIETY OR PANIC ATTACK. LIMIT TO 5 DAYS A WEEK TO AVOID ADDICTION   conjugated estrogens (PREMARIN) vaginal cream  Apply one applicatorful vaginally once a week   Cyanocobalamin (VITAMIN B 12 PO) Take 1 tablet by mouth daily.   diclofenac Sodium (VOLTAREN) 1 % GEL APPLY 2 GRAMS TOPICALLY 3-4 TIMES DAILY AS NEEDED FOR PAIN (Patient taking differently: Apply 2 g topically 4 (four) times daily.    dicyclomine (BENTYL) 20 MG tablet TAKE 1 TABLET  BY MOUTH FOUR TIMES    gabapentin (NEURONTIN) 600 MG tablet TAKE 1/2 TO 1 TABLET 3 TIMES DAILY FOR FACIAL PAIN    lovastatin (MEVACOR) 20 MG tablet TAKE 1 TABLET EACH NIGHT    olmesartan (BENICAR) 40 MG tablet TAKE 1 TABLET DAILY   omeprazole (PRILOSEC) 40 MG capsule Take  1 capsule Daily    polyvinyl alcohol (LIQUIFILM TEARS) 1.4 % ophthalmic solution Place 1 drop into both eyes 2 (two) times daily as needed for dry eyes.   triamcinolone (KENALOG) 0.1 % paste Apply to Lip Ulcers 3 to 4 x/day (Patient taking differently: Use as directed 1 Application in the mouth or throat in the morning, at noon, in the evening, and at bedtime. Apply to Lip Ulcers 3 to 4 x/day)   Vaginal Lubricant (REPLENS) GEL Place 1 Applicatorful vaginally daily.   Vitamin D-Vitamin K (VITAMIN K2-VITAMIN D3 ) Take 1 tablet  daily. 90 mcg of K2, 5,000 units of D3     Allergies  Allergen Reactions   Latex Hives    TONGUE HAD BLISTERS WHEN  HAD DENTAL SURGERY   Amoxicillin Nausea And Vomiting   Aspirin Other (See Comments)    Gi upset   Barbiturates Other (See Comments)    Unknown reaction   Celebrex [Celecoxib] Other (See Comments)    Unknown reaction   Codeine Itching   Evista [Raloxifene] Other (See Comments)    Unknown reaction   Lipitor [Atorvastatin] Other (See Comments)    Unknown reaction   Morphine And Related Other (See Comments)    Unknown reaction   Oruvail [Ketoprofen] Other (See Comments)    Unknown reaction   Pantoprazole Diarrhea   Paraffin Other (See Comments)    Unknown reaction   Prochlorperazine Other (See Comments)    Uncontrolled shaking   Proloprim [Trimethoprim] Other (See Comments)    Unknown reaction   Venlafaxine Nausea Only   Vibra-Tab [Doxycycline] Other (See Comments)    Unknown reaction   Vioxx [Rofecoxib] Other (See Comments)    edema   Betadine [Povidone Iodine] Rash   Caffeine Palpitations     Problem list She has Headache, post-traumatic, chronic; Hypertension;  Hyperlipidemia; GERD; Vitamin D deficiency; Atypical facial pain; Medication management; Mild malnutrition (HCC); COPD (chronic obstructive pulmonary disease) with emphysema (HCC); Tortuous aorta (HCC); Osteoporosis; Aortic atherosclerosis (HCC) by Abd CT scan (05/2020) ; Hepatic steatosis; Renal infarct (HCC); Chronic hyperkalemia; Coronary atherosclerosis due to lipid rich plaque; Atrial fibrillation with controlled ventricular response (HCC); Diastolic CHF (HCC); History of embolic stroke; Anxiety; Thrombophilia (HCC); B12 deficiency; Benzodiazepine withdrawal without complication (HCC); and Chronic atrial fibrillation with RVR (HCC) on their problem list.   Observations/Objective:  BP 122/70   Pulse 73   Temp 98 F (36.7 C)   Resp 17   Ht 5' 3.25" (1.607 m)   Wt 110 lb 6.4 oz (50.1 kg)   SpO2 98%   BMI 19.40 kg/m   HEENT - WNL. Neck - supple.  Chest - Clear equal BS. Cor - Nl HS. RRR w/o sig MGR. PP 1(+). No edema. MS- FROM w/o deformities.  Gait Nl. Neuro -  Nl w/o focal abnormalities.   Assessment and Plan:   1. Essential hypertension   2. Panic anxiety syndrome  - Add - escitalopram (LEXAPRO) 10 MG tablet;  Take 1 tablet Daily for Mood & Chronic Anxiety   Dispense: 90 tablet; Refill: 3  - clonazePAM (KLONOPIN) 0.5 MG tablet;  Take 1/2 tablet 1 or 2 x /day if Needed for Chronic Anxiety &  Panic Attacks & take 1/2 to 1 whole tablet at Bedtime if needed for Sleep  Dispense:  60 tablet; Refill: 0  3. Anxiety  - Add - escitalopram (LEXAPRO) 10 MG tablet;  Take 1 tablet Daily for Mood & Chronic Anxiety   Dispense: 90 tablet; Refill: 3   Follow Up Instructions:        I discussed the assessment and treatment plan with the patient. The patient was provided an opportunity to ask questions and all were answered. The patient agreed with the plan and demonstrated an understanding of the instructions.       The patient was advised to call back or seek an in-person  evaluation if the symptoms worsen or if the condition fails to improve as anticipated.    Marinus Maw, MD

## 2022-11-01 ENCOUNTER — Ambulatory Visit (INDEPENDENT_AMBULATORY_CARE_PROVIDER_SITE_OTHER): Payer: PPO | Admitting: Internal Medicine

## 2022-11-01 ENCOUNTER — Encounter: Payer: Self-pay | Admitting: Internal Medicine

## 2022-11-01 VITALS — BP 122/70 | HR 73 | Temp 98.0°F | Resp 17 | Ht 63.25 in | Wt 110.4 lb

## 2022-11-01 DIAGNOSIS — F419 Anxiety disorder, unspecified: Secondary | ICD-10-CM

## 2022-11-01 DIAGNOSIS — F410 Panic disorder [episodic paroxysmal anxiety] without agoraphobia: Secondary | ICD-10-CM | POA: Diagnosis not present

## 2022-11-01 DIAGNOSIS — I10 Essential (primary) hypertension: Secondary | ICD-10-CM

## 2022-11-01 MED ORDER — CLONAZEPAM 0.5 MG PO TABS
ORAL_TABLET | ORAL | 0 refills | Status: DC
Start: 2022-11-01 — End: 2022-12-03

## 2022-11-01 MED ORDER — ESCITALOPRAM OXALATE 10 MG PO TABS
ORAL_TABLET | ORAL | 3 refills | Status: DC
Start: 2022-11-01 — End: 2023-09-02

## 2022-11-04 ENCOUNTER — Encounter: Payer: Self-pay | Admitting: Internal Medicine

## 2022-11-09 ENCOUNTER — Telehealth: Payer: Self-pay | Admitting: *Deleted

## 2022-11-09 NOTE — Telephone Encounter (Signed)
CRN called to follow up on patient for Focused Nurse Outreach  1. Anxiety: Patient recently seen by PCP and was started on Escitalopram 10 mg daily to help with anxiety. She reports she is taking it daily as prescribed. She also has Klonopin on hand she can take on a prn basis. She reports her husband died about a year ago and her brother-in-law passed away about 6 months ago which contributes to her anxiety. At times it bothers her to be by herself. She stayed with her sister for 2 weeks after ED visit on 5/3. She just went back home yesterday. Her sister fixes her pill box for her. Also spoke with patient's sister who states that almost every night patient was up and down all night and didn't sleep well. She reports she took a nap today and is feeling better. Was feeling weak earlier. She does feel nauseated at times. Advised to make sure she is eating something prior to taking medications.   CRN will f/u again in 2 months  Telemedicine Time/Documentation: 34 min

## 2022-11-15 ENCOUNTER — Ambulatory Visit
Admission: RE | Admit: 2022-11-15 | Discharge: 2022-11-15 | Disposition: A | Discharge status: Home / Self Care | Payer: PPO | Source: Ambulatory Visit | Attending: Internal Medicine | Admitting: Internal Medicine

## 2022-11-15 DIAGNOSIS — Z1231 Encounter for screening mammogram for malignant neoplasm of breast: Secondary | ICD-10-CM | POA: Diagnosis not present

## 2022-11-22 NOTE — Telephone Encounter (Signed)
CARE PLAN   Printed on: 11/22/2022 Gilcrease,Maebell A __________________________________________________  Ongoing Problems Problem: CP: 1. Actions for Patient  Goal 1: Review and act on the below actions as discussed with your clinical pharmacist Priority: Medium  Achieve by: 01/30/2023  Long-term   Care Team Interventions:   1. Action 1: Continue Escitalopram daily and Clonazepam as needed for anxiety. Contact PCP or CCM team if symptoms worsen. Done on: 11/22/2022  2. Action 2:  Make sure to eat something prior to taking medications to help minimize nausea Done on: 11/22/2022  3. Action 3: Continue all medications as prescribed Done on: 11/22/2022 Problem: CP: 2. Chronic Care Management (CCM) Team  Goal 1: Contact your clinical pharmacist and care team with any questions or concerns Priority: Medium  Achieve by: 11/22/2022  Long-term   Care Team Interventions:   1. Team Phone #: [phone] Done on: 11/22/2022  2. Clinical Pharmacist: Yolande Jolly Done on: 11/22/2022  3. Coordinating Registered Nurse: Candiss Norse Done on: 11/22/2022  4. Health Concierge: Maryellen Pile Done on: 11/22/2022 Problem: CP: Anxiety  Goal 1: Reduce overall level, frequency, and intensity of anxiety so that daily functioning is not impaired Priority: High  Achieve by: 01/30/2023  Long-term   Care Team Interventions:   1. Educated Patient on anxiety management techniques (e.g., relaxation, deep breathing, positive visualization, reassuring self-statements) Done on: 11/22/2022  2. Discussed anxiety treatment options with Patient (therapy/counseling, medication management) Done on: 11/22/2022  3. Continue taking Escitalopram and Clonazepam as prescribed. Done on: 11/22/2022  4. Educated Patient on proper use of Escitalopram and Clonazepam, how long it takes to see benefits, possible side effects and to report back if anxiety symptoms continue with medical treatment Done on:  11/22/2022 Problem: CP: Atrial Fibrillation  Goal 1: Maintain controlled heart rhythm and rate and prevent stroke / clots Priority: Medium  Achieve by: 01/30/2023  Long-term   Care Team Interventions:   1. Educated Patient on signs and symptoms of bleeding and to contact pharmacist / nurse / PCP with any questions or concerns. This includes a nosebleed that won't stop within 10 minutes, cuts / scrapes that won't stop bleeding, red or cola colored urine, red or dark-colored stool. Done on: 11/22/2022  2. Continue taking Eliquis as prescribed to prevent reduce risk of stroke. Done on: 11/22/2022 Problem: CP: Preventative health care screenings  Goal 1: Patient will have all recommended preventative health care screening(s) Priority: High  Achieve by: 11/22/2022  Long-term   Care Team Interventions:   1. Annual Wellness Visit scheduled for (10/19/2023) Done on: 11/22/2022  [facility] Care plan revision: 13 min

## 2022-11-23 DIAGNOSIS — I10 Essential (primary) hypertension: Secondary | ICD-10-CM | POA: Diagnosis not present

## 2022-11-23 DIAGNOSIS — F419 Anxiety disorder, unspecified: Secondary | ICD-10-CM | POA: Diagnosis not present

## 2022-11-23 DIAGNOSIS — E441 Mild protein-calorie malnutrition: Secondary | ICD-10-CM | POA: Diagnosis not present

## 2022-11-23 DIAGNOSIS — E782 Mixed hyperlipidemia: Secondary | ICD-10-CM | POA: Diagnosis not present

## 2022-12-02 NOTE — Progress Notes (Unsigned)
Future Appointments  Date Time Provider Department  12/03/2022 10:30 AM Lucky Cowboy, MD [facility]  12/07/2022 10:30 AM Carlos Levering, NP [facility]  04/25/2023          3 mo ov 10:30 AM Lucky Cowboy, MD [facility]  07/03/2023              cpe  10:00 AM Adela Glimpse, NP [facility]  10/22/2023             wellness  10:00 AM Raynelle Dick, NP [facility]    History of Present Illness:      The patient is a very nice 89 y.o. recently WWF with HTN, cAfib, HLD, Pre-Diabetes, Vitamin D Deficiency .  Patient was seen about a month ago following an ER visit attributed to anxiety & she was started on Lexapro 10 mg in hopes of allowing her to taper off of her Klonopin. She continues on low dose Klonopin 0.5 mg x 1/2 tablet daily     Current Outpatient Medications on File Prior to Visit  Medication Sig   acetaminophen (TYLENOL) 500 MG tablet Take 1,000 mg  every 8  hours as needed for headache.    amLODipine (NORVASC) 2.5 MG tablet TAKE 1 TABLET DAILY   apixaban (ELIQUIS) 2.5 MG TABS tablet Take  1 tablet  2 x /day (every 12 hours)    atenolol (TENORMIN) 100 MG tablet Take  1 tablet  Daily     clobetasol cream (TEMOVATE) 0.05 % Apply to rash on Right Ear   3 to 4 x /day    clonazePAM (KLONOPIN) 0.5 MG tablet Take 1/2 tablet 1 or 2 x /day if Needed for Chronic Anxiety    conjugated estrogens  vaginal cream Place vaginally once a week   VITAMIN B 12 Take 1 tablet daily.   diclofenac  1 % GEL APPLY 2 GRAMS TOPICALLY 3-4 TIMES DAILY  AS NEEDED FOR PAIN   dicyclomine 20 MG tablet TAKE 1 TABLET FOUR TIMES DAILY    escitalopram (LEXAPRO) 10 MG tablet Take 1 tablet Daily for Mood & Chronic Anxiety   gabapentin (NEURONTIN) 600 MG tablet TAKE 1/2 TO 1 TABLET 3 TIMES DAILY    lovastatin20 MG tablet TAKE 1 TABLET  EACH NIGHT    olmesartan 40 MG tablet TAKE 1 TABLET DAILY    omeprazole (PRILOSEC) 40 MG capsule Take  1 capsule Daily   LIQUIFILM TEARS) 1.4 % ophth soln Place 1 drop  into eyes 2 times daily as needed   triamcinolone 0.1 % paste  Apply to Lip Ulcers 3 to 4 x/day   Vaginal Lubricant / REPLENS  GEL Place 1 Applicatorful vaginally daily.   VIT K2-VIT D3  Take 1 tablet daily. 90 mcg  K2, 5,000 u  D3     Allergies  Allergen Reactions   Latex Hives    TONGUE HAD BLISTERS WHEN HAD DENTAL SURGERY   Amoxicillin Nausea And Vomiting   Aspirin Other (See Comments)    Gi upset   Barbiturates Other (See Comments)    Unknown reaction   Celebrex [Celecoxib] Other (See Comments)    Unknown reaction   Codeine Itching   Evista [Raloxifene] Other (See Comments)    Unknown reaction   Lipitor [Atorvastatin] Other (See Comments)    Unknown reaction   Morphine And Codeine Other (See Comments)    Unknown reaction   Oruvail [Ketoprofen] Other (See Comments)    Unknown reaction   Pantoprazole Diarrhea   Paraffin Other (  See Comments)    Unknown reaction   Prochlorperazine Other (See Comments)    Uncontrolled shaking   Proloprim [Trimethoprim] Other (See Comments)    Unknown reaction   Venlafaxine Nausea Only   Vibra-Tab [Doxycycline] Other (See Comments)    Unknown reaction   Vioxx [Rofecoxib] Other (See Comments)    edema   Betadine [Povidone Iodine] Rash   Caffeine Palpitations     Problem list She has Headache, post-traumatic, chronic; Hypertension; Hyperlipidemia; GERD; Vitamin D deficiency; Atypical facial pain; Medication management; Mild malnutrition (HCC); COPD (chronic obstructive pulmonary disease) with emphysema (HCC); Tortuous aorta (HCC); Osteoporosis; Aortic atherosclerosis (HCC) by Abd CT scan (05/2020) ; Hepatic steatosis; Renal infarct (HCC); Chronic hyperkalemia; Coronary atherosclerosis due to lipid rich plaque; Atrial fibrillation with controlled ventricular response (HCC); Diastolic CHF (HCC); History of embolic stroke; Anxiety; Thrombophilia (HCC); B12 deficiency; Benzodiazepine withdrawal without complication (HCC); and Chronic atrial  fibrillation with RVR (HCC) on their problem list.   Observations/Objective:  BP 130/68   Pulse 76   Temp 97.9 F (36.6 C)   Resp 17   Ht 5' 3.25" (1.607 m)   Wt 108 lb 9.6 oz (49.3 kg)   SpO2 99%   BMI 19.09 kg/m   HEENT - WNL. Neck - supple.  Chest - Clear equal BS. Cor - Nl HS. RRR w/o sig MGR. PP 1(+). No edema. MS- FROM w/o deformities.  Gait Nl. Neuro -  Nl w/o focal abnormalities.   Assessment and Plan:   1. Essential hypertension   2. Panic anxiety syndrome  - mirtazapine (REMERON) 7.5 MG tablet;  Take  1 to 2 hours  before Bedtime  for Appetite & Sleep   Dispense: 90 tablet; Refill: 3  - Adding low dose Remeron to help with appetite.  Follow Up Instructions:        I discussed the assessment and treatment plan with the patient. The patient was provided an opportunity to ask questions and all were answered. The patient agreed with the plan and demonstrated an understanding of the instructions.       The patient was advised to call back or seek an in-person evaluation if the symptoms worsen or if the condition fails to improve as anticipated.    Marinus Maw, MD

## 2022-12-03 ENCOUNTER — Encounter: Payer: Self-pay | Admitting: Internal Medicine

## 2022-12-03 ENCOUNTER — Ambulatory Visit (INDEPENDENT_AMBULATORY_CARE_PROVIDER_SITE_OTHER): Payer: PPO | Admitting: Internal Medicine

## 2022-12-03 VITALS — BP 130/68 | HR 76 | Temp 97.9°F | Resp 17 | Ht 63.25 in | Wt 108.6 lb

## 2022-12-03 DIAGNOSIS — I10 Essential (primary) hypertension: Secondary | ICD-10-CM | POA: Diagnosis not present

## 2022-12-03 DIAGNOSIS — F410 Panic disorder [episodic paroxysmal anxiety] without agoraphobia: Secondary | ICD-10-CM

## 2022-12-03 MED ORDER — MIRTAZAPINE 7.5 MG PO TABS
ORAL_TABLET | ORAL | 3 refills | Status: DC
Start: 2022-12-03 — End: 2022-12-19

## 2022-12-03 MED ORDER — CLONAZEPAM 0.5 MG PO TABS: ORAL_TABLET | ORAL | 0 refills | Status: DC

## 2022-12-06 NOTE — Progress Notes (Deleted)
Cardiology Clinic Note   Date: 12/06/2022 ID: RUKIYA METTE, DOB 05/25/1934, MRN [id number]  Primary Cardiologist:  Thurmon Fair, MD  Patient Profile    BINETA COFFING is a 89 y.o. female who presents to the clinic today for ***    Past medical history significant for: CAD. Chronic diastolic heart failure. Echo 10/11/2019: EF 55 to 60%.  Diastolic function cannot be evaluated.  Normal RV function.  Severe BAE.  Mild MR.  Moderate TR.  Mild aortic valve sclerosis without stenosis.  Unchanged from prior study September 2019. PAF. 7-day event monitor 12/21/2019: A-fib throughout the study.  Occasional PVCs.  Min HR 43 bpm, max HR 193 bpm, average 68 bpm. Renal infarct. Hypertension. Hyperlipidemia. COPD. GERD. CVA.     History of Present Illness    Ubah A Weinrich was first evaluated by Dr. Duke Salvia on 05/28/2018 for splenic and renal infarcts at the request of Quentin Mulling, PA-C.  Infarcts were felt to be secondary to PAF.  Patient transferred her care to Dr. Royann Shivers on 10/13/2020 secondary to Dr. Duke Salvia moving offices and patient's husband also being a patient of Dr Royann Shivers.  She was first seen by Dr. Royann Shivers on 03/14/2021.  Patient reported increased emotional stress secondary to her husband's worsening cognitive problems.  She was hypertensive which she felt was due to her emotional state.  She was otherwise stable.  Patient was last seen in the office by Dr. Royann Shivers on 09/29/2021 for routine follow-up.  She continues to be stable from a cardiac standpoint.  No medication changes were made.  Patient presented to the ED via EMS for complaints of uncontrolled A-fib.  She reported feeling her heart racing.  EMS reported patient was anxious.  EMS reported A-fib with rates in the 160s.  They administered Cardizem 20 mg.  Patient was started on Cardizem drip and heart rate came down to 75 bpm.  Plan for hospital admission.  Patient reported Klonopin had recently been stopped and replaced  by buspirone.  She had diffusely positive review of systems thought to be attributed to benzodiazepine withdrawal.  Dose of Klonopin improved her symptoms.  It was recommended she continue low-dose benzodiazepine to be managed by PCP.  Given stabilization of symptoms hospitalist felt patient was safe to be discharged home.  Today, patient ***  Chronic diastolic heart failure.  Echo April 2021 showed normal LV/RV function, severe BAE.  Patient*** Euvolemic and well compensated on exam.  Continue atenolol, olmesartan. PAF.  Patient with recent ED visit for uncontrolled A-fib.  She had a myriad of complaints all felt to be related to benzodiazepine withdrawal.  EMS found patient in A-fib with heart rate in the 160s.  EMS administered Cardizem 20 mg and patient was placed on a Cardizem drip upon arrival to the ED.  Heart rate came down to 75 bpm.  Patient*** Denies spontaneous bleeding concerns.  Continue atenolol, Eliquis. Hypertension. BP today *** Patient denies headaches, dizziness or vision changes. Continue atenolol, olmesartan.   ROS: All other systems reviewed and are otherwise negative except as noted in History of Present Illness.  Studies Reviewed    @[email or web address] @ ECG personally reviewed by me today: ***  No significant changes from ***  Risk Assessment/Calculations    {Does this patient have ATRIAL FIBRILLATION?:[phone]} No BP recorded.  {Refresh Note OR Click here to enter BP  :1}***        Physical Exam    VS:  There were no vitals taken for this visit. ,  BMI There is no height or weight on file to calculate BMI.  GEN: Well nourished, well developed, in no acute distress. Neck: No JVD or carotid bruits. Cardiac: *** RRR. No murmurs. No rubs or gallops.   Respiratory:  Respirations regular and unlabored. Clear to auscultation without rales, wheezing or rhonchi. GI: Soft, nontender, nondistended. Extremities: Radials/DP/PT 2+ and equal bilaterally. No clubbing or cyanosis. No  edema ***  Skin: Warm and dry, no rash. Neuro: Strength intact.  Assessment & Plan   ***  Disposition: ***     {Are you ordering a CV Procedure (e.g. stress test, cath, DCCV, TEE, etc)?   Press F2        :[id number]}   Signed, Etta Grandchild. Kaziyah Parkison, DNP, NP-C

## 2022-12-07 ENCOUNTER — Encounter: Payer: Self-pay | Admitting: Student

## 2022-12-07 ENCOUNTER — Ambulatory Visit: Payer: PPO | Attending: Student | Admitting: Student

## 2022-12-18 NOTE — Progress Notes (Unsigned)
Cardiology Office Note:  .   Date:  12/19/2022  ID:  Debra White, DOB 29-Sep-1933, MRN [id number] PCP: Debra Cowboy, MD  [facility] Providers Cardiologist:  Thurmon Fair, MD {   History of Present Illness: .   Debra White is a 89 y.o. female with a past medical history of hypertension, emphysema, chronic hyponatremia, atherosclerosis of the aorta and renal/splenic infarcts which were demonstrated to be likely due to paroxysmal atrial fibrillation here for follow-up appointment.  She was last seen by Dr. Royann Shivers 09/29/21 and at that time did not have any cardiovascular complaints and even her palpitations seem to subside.  Poor sleep and poor appetite.  This is largely related to distress over the steadily declining cognitive and physical abilities of her husband.  He has dementia and is now bedridden.  She has support from hospice.  She tried having him in a nursing home for about a month but then decided to bring him back home.  She is even lost more weight at this point about 5 pounds since her last appointment.  BMI is borderline low at 20.  She denies chest pain or shortness of breath at rest or activity, dizziness, syncope, falls, injuries, bleeding, abdominal pain, new focal neurologic complaints, edema, claudication.  No orthopnea or PND.  She is on dose adjusted apixaban due to age and small body size.  Most recent LDL 55 and exceptional HDL of 92.  Hemoglobin A1c 5.8%, borderline high.  Normal renal function and her most recent hemoglobin was normal at 12.5.  Previous CT of the abdomen shows extensive atherosclerosis of the aorta as well as diverticulosis.  Echocardiogram 09/2019 revealed LVEF 55 to 60% with indeterminate diastolic function.  Has severe biatrial dilation, mild mitral insufficiency, moderate tricuspid insufficiency and aortic valve sclerosis without stenosis.  Today, she tells me that she was in the hospital 10/26/2022 for atrial fibrillation she was  feeling better at that morning, evenings.  She states that her heart was beating really fast and she was short of breath.  She was given an IV dose of diltiazem.  She also was having benzo withdrawals from clonazepam since it was discontinued the previous week which was contributing to the episode.  Patient was released prior to admission and was no longer in atrial fibrillation with RVR at that time.  She tells me today that her husband died and she is under a lot of stress.  She has not had a feeling of a racing heartbeat since the hospital.  Sometimes, her breast feels tight but no chest pains.  She has history of a nervous stomach and takes medicine to help.  The broad tightness eases up after medication.  She also is on pills to help her sleep and help with appetite.  Continues to lose weight and BMI is now 18.  Suggested boost breeze and increasing caloric intake.  Typically, her diet consists of cheese, grits, sugar-free jelly, hamburger meat, mac & cheese.  Encourage more protein intake for weight gain.  Reports no shortness of breath nor dyspnea on exertion. Reports no chest pain, pressure, or tightness. No edema, orthopnea, PND. Reports no palpitations.    ROS: Pertinent ROS in HPI  Studies Reviewed: .        Echo 10/11/2019  1. Left ventricular ejection fraction, by estimation, is 55 to 60%. The  left ventricle has normal function. The left ventricle has no regional  wall motion abnormalities. Left ventricular diastolic function could not  be evaluated.   2. Right ventricular systolic function is normal. The right ventricular  size is normal. There is normal pulmonary artery systolic pressure. The  estimated right ventricular systolic pressure is 30.2 mmHg.   3. Left atrial size was severely dilated.   4. Right atrial size was severely dilated.   5. The mitral valve is grossly normal. Mild mitral valve regurgitation.  No evidence of mitral stenosis.   6. Tricuspid valve regurgitation  is moderate.   7. The aortic valve is tricuspid. Aortic valve regurgitation is not  visualized. Mild aortic valve sclerosis is present, with no evidence of  aortic valve stenosis.   8. The inferior vena cava is normal in size with greater than 50%  respiratory variability, suggesting right atrial pressure of 3 mmHg.    7 Day Event Monitor 03/2018: (patient wore the monitor for 6 hours)   Quality: Fair.  Baseline artifact. Predominant rhythm: sinus bradycardia Average heart rate: 59 bpm Max heart rate: 77 bpm Min heart rate: 48 bpm Pauses >2.5 seconds: none   No arrhythmias noted   7 Day Event Monitor 12/21/2019:   Quality: Fair.  Baseline artifact. Predominant rhythm: atrial fibrillation       Average heart rate: 68 bpm Max heart rate: 193 bpm Min heart rate: 43 bpm   Occasional PVCs  Patient was in atrial fibrillation throughout the study.     Recent Labs: 06/22/2021: ALT 15; Magnesium 1.9; TSH 0.84 07/26/2021: BUN 15; Creat 0.84; Hemoglobin 12.5; Platelets 199; Potassium 4.3; Sodium 136      Lipid Panel Labs (Brief)          Component Value Date/Time    CHOL 168 06/22/2021 1125    TRIG 120 06/22/2021 1125    HDL 92 06/22/2021 1125    CHOLHDL 1.8 06/22/2021 1125    VLDL 31 (H) 02/05/2017 1149    LDLCALC 55 06/22/2021 1125     Risk Assessment/Calculations:    CHA2DS2-VASc Score = 4   This indicates a 4.8% annual risk of stroke. The patient's score is based upon: CHF History: 0 HTN History: 0 Diabetes History: 0 Stroke History: 0 Vascular Disease History: 1 Age Score: 2 Gender Score: 1        Physical Exam:   VS:  BP (!) 140/68   Pulse 73   Ht 5\' 4"  (1.626 m)   Wt 107 lb (48.5 kg)   SpO2 97%   BMI 18.37 kg/m    Wt Readings from Last 3 Encounters:  12/19/22 107 lb (48.5 kg)  12/03/22 108 lb 9.6 oz (49.3 kg)  11/01/22 110 lb 6.4 oz (50.1 kg)    GEN: Well nourished, well developed in no acute distress NECK: No JVD; No carotid bruits CARDIAC:  Irregularly irregular, no murmurs, rubs, gallops RESPIRATORY:  Clear to auscultation without rales, wheezing or rhonchi  ABDOMEN: Soft, non-tender, non-distended EXTREMITIES:  No edema; No deformity   ASSESSMENT AND PLAN: .   1.  Paroxysmal atrial fibrillation -Eliquis 2.5mg  BID, usually 47 dollars which is affordable for her -Rate controlled atrial fibrillation today -Continue current medication regimen which includes amlodipine 2.5 mg daily, Benicar 40 mg daily  2.  Essential hypertension -bp well controlled today -Continue current medications -Continue heart healthy diet and increase caloric intake for weight gain  3.  Hyperlipidemia -LDL 61 -Her primary care looks after her cholesterol -Continue current medication, LDL  4.  Aortic atherosclerosis -LDL goal less than 70  5. Weight loss, unintentional -Recommended boost  breeze to increase caloric and protein requirements -Small frequent meals      Dispo: Follow-up with Dr. Royann Shivers 3 months  Signed, Sharlene Dory, PA-C

## 2022-12-19 ENCOUNTER — Ambulatory Visit: Payer: PPO | Attending: Physician Assistant | Admitting: Physician Assistant

## 2022-12-19 ENCOUNTER — Encounter: Payer: Self-pay | Admitting: Physician Assistant

## 2022-12-19 VITALS — BP 140/68 | HR 73 | Ht 64.0 in | Wt 107.0 lb

## 2022-12-19 DIAGNOSIS — E7800 Pure hypercholesterolemia, unspecified: Secondary | ICD-10-CM | POA: Diagnosis not present

## 2022-12-19 DIAGNOSIS — I10 Essential (primary) hypertension: Secondary | ICD-10-CM | POA: Diagnosis not present

## 2022-12-19 DIAGNOSIS — I700 Atherosclerosis of aorta: Secondary | ICD-10-CM

## 2022-12-19 DIAGNOSIS — I480 Paroxysmal atrial fibrillation: Secondary | ICD-10-CM

## 2022-12-19 NOTE — Addendum Note (Signed)
Addended by: Oleta Mouse on: 12/19/2022 02:17 PM   Modules accepted: Orders

## 2022-12-19 NOTE — Patient Instructions (Signed)
Medication Instructions:   PLEASE CALL CLINIC BACK TODAY  [phone]  WITH UPDATED MEDICINE LIST   *If you need a refill on your cardiac medications before your next appointment, please call your pharmacy*   Lab Work: NONE ORDERED  TODAY   If you have labs (blood work) drawn today and your tests are completely normal, you will receive your results only by: MyChart Message (if you have MyChart) OR A paper copy in the mail If you have any lab test that is abnormal or we need to change your treatment, we will call you to review the results.   Testing/Procedures: NONE ORDERED  TODAY     Follow-Up: At [facility], you and your health needs are our priority.  As part of our continuing mission to provide you with exceptional heart care, we have created designated Provider Care Teams.  These Care Teams include your primary Cardiologist (physician) and Advanced Practice Providers (APPs -  Physician Assistants and Nurse Practitioners) who all work together to provide you with the care you need, when you need it.  We recommend signing up for the patient portal called "MyChart".  Sign up information is provided on this After Visit Summary.  MyChart is used to connect with patients for Virtual Visits (Telemedicine).  Patients are able to view lab/test results, encounter notes, upcoming appointments, etc.  Non-urgent messages can be sent to your provider as well.   To learn more about what you can do with MyChart, go to [email or web address].    Your next appointment:   3-4 month(s)  Provider:   Thurmon Fair, MD     Other Instructions   Heart-Healthy Eating Plan Eating a healthy diet is important for the health of your heart. A heart-healthy eating plan includes: Eating less unhealthy fats. Eating more healthy fats. Eating less salt in your food. Salt is also called sodium. Making other changes in your diet. Talk with your doctor or a diet specialist (dietitian) to create  an eating plan that is right for you. What is my plan? Your doctor may recommend an eating plan that includes: Total fat: ______% or less of total calories a day. Saturated fat: ______% or less of total calories a day. Cholesterol: less than _________mg a day. Sodium: less than _________mg a day. What are tips for following this plan? Cooking Avoid frying your food. Try to bake, boil, grill, or broil it instead. You can also reduce fat by: Removing the skin from poultry. Removing all visible fats from meats. Steaming vegetables in water or broth. Meal planning  At meals, divide your plate into four equal parts: Fill one-half of your plate with vegetables and green salads. Fill one-fourth of your plate with whole grains. Fill one-fourth of your plate with lean protein foods. Eat 2-4 cups of vegetables per day. One cup of vegetables is: 1 cup (91 g) broccoli or cauliflower florets. 2 medium carrots. 1 large bell pepper. 1 large sweet potato. 1 large tomato. 1 medium white potato. 2 cups (150 g) raw leafy greens. Eat 1-2 cups of fruit per day. One cup of fruit is: 1 small apple 1 large banana 1 cup (237 g) mixed fruit, 1 large orange,  cup (82 g) dried fruit, 1 cup (240 mL) 100% fruit juice. Eat more foods that have soluble fiber. These are apples, broccoli, carrots, beans, peas, and barley. Try to get 20-30 g of fiber per day. Eat 4-5 servings of nuts, legumes, and seeds per week: 1 serving of dried  beans or legumes equals  cup (90 g) cooked. 1 serving of nuts is  oz (12 almonds, 24 pistachios, or 7 walnut halves). 1 serving of seeds equals  oz (8 g). General information Eat more home-cooked food. Eat less restaurant, buffet, and fast food. Limit or avoid alcohol. Limit foods that are high in starch and sugar. Avoid fried foods. Lose weight if you are overweight. Keep track of how much salt (sodium) you eat. This is important if you have high blood pressure. Ask your  doctor to tell you more about this. Try to add vegetarian meals each week. Fats Choose healthy fats. These include olive oil and canola oil, flaxseeds, walnuts, almonds, and seeds. Eat more omega-3 fats. These include salmon, mackerel, sardines, tuna, flaxseed oil, and ground flaxseeds. Try to eat fish at least 2 times each week. Check food labels. Avoid foods with trans fats or high amounts of saturated fat. Limit saturated fats. These are often found in animal products, such as meats, butter, and cream. These are also found in plant foods, such as palm oil, palm kernel oil, and coconut oil. Avoid foods with partially hydrogenated oils in them. These have trans fats. Examples are stick margarine, some tub margarines, cookies, crackers, and other baked goods. What foods should I eat? Fruits All fresh, canned (in natural juice), or frozen fruits. Vegetables Fresh or frozen vegetables (raw, steamed, roasted, or grilled). Green salads. Grains Most grains. Choose whole wheat and whole grains most of the time. Rice and pasta, including brown rice and pastas made with whole wheat. Meats and other proteins Lean, well-trimmed beef, veal, pork, and lamb. Chicken and [place] without skin. All fish and shellfish. Wild duck, rabbit, pheasant, and venison. Egg whites or low-cholesterol egg substitutes. Dried beans, peas, lentils, and tofu. Seeds and most nuts. Dairy Low-fat or nonfat cheeses, including ricotta and mozzarella. Skim or 1% milk that is liquid, powdered, or evaporated. Buttermilk that is made with low-fat milk. Nonfat or low-fat yogurt. Fats and oils Non-hydrogenated (trans-free) margarines. Vegetable oils, including soybean, sesame, sunflower, olive, peanut, safflower, corn, canola, and cottonseed. Salad dressings or mayonnaise made with a vegetable oil. Beverages Mineral water. Coffee and tea. Diet carbonated beverages. Sweets and desserts Sherbet, gelatin, and fruit ice. Small amounts of  dark chocolate. Limit all sweets and desserts. Seasonings and condiments All seasonings and condiments. The items listed above may not be a complete list of foods and drinks you can eat. Contact a dietitian for more options. What foods should I avoid? Fruits Canned fruit in heavy syrup. Fruit in cream or butter sauce. Fried fruit. Limit coconut. Vegetables Vegetables cooked in cheese, cream, or butter sauce. Fried vegetables. Grains Breads that are made with saturated or trans fats, oils, or whole milk. Croissants. Sweet rolls. Donuts. High-fat crackers, such as cheese crackers. Meats and other proteins Fatty meats, such as hot dogs, ribs, sausage, bacon, rib-eye roast or steak. High-fat deli meats, such as salami and bologna. Caviar. Domestic duck and goose. Organ meats, such as liver. Dairy Cream, sour cream, cream cheese, and creamed cottage cheese. Whole-milk cheeses. Whole or 2% milk that is liquid, evaporated, or condensed. Whole buttermilk. Cream sauce or high-fat cheese sauce. Yogurt that is made from whole milk. Fats and oils Meat fat, or shortening. Cocoa butter, hydrogenated oils, palm oil, coconut oil, palm kernel oil. Solid fats and shortenings, including bacon fat, salt pork, lard, and butter. Nondairy cream substitutes. Salad dressings with cheese or sour cream. Beverages Regular sodas and juice drinks  with added sugar. Sweets and desserts Frosting. Pudding. Cookies. Cakes. Pies. Milk chocolate or white chocolate. Buttered syrups. Full-fat ice cream or ice cream drinks. The items listed above may not be a complete list of foods and drinks to avoid. Contact a dietitian for more information. Summary Heart-healthy meal planning includes eating less unhealthy fats, eating more healthy fats, and making other changes in your diet. Eat a balanced diet. This includes fruits and vegetables, low-fat or nonfat dairy, lean protein, nuts and legumes, whole grains, and heart-healthy oils  and fats. This information is not intended to replace advice given to you by your health care provider. Make sure you discuss any questions you have with your health care provider. Document Revised: 07/17/2021 Document Reviewed: 07/17/2021 Elsevier Patient Education  2024 [employer].

## 2022-12-20 ENCOUNTER — Telehealth: Payer: Self-pay | Admitting: Gastroenterology

## 2022-12-20 NOTE — Telephone Encounter (Signed)
PT had to call [facility] to go to hospital with pain in chest and stomach. She was told she was having a diverticulitis flare and she is seeking relief. Requesting call back

## 2022-12-20 NOTE — Telephone Encounter (Signed)
Tried to reach pt and no answer and voice mail full.  Will attempt later

## 2022-12-21 NOTE — Telephone Encounter (Signed)
Mail box full on home number and alternate number no answer no voice mail the line just rings.

## 2022-12-24 ENCOUNTER — Other Ambulatory Visit: Payer: Self-pay | Admitting: Internal Medicine

## 2022-12-24 ENCOUNTER — Other Ambulatory Visit: Payer: Self-pay | Admitting: Cardiovascular Disease

## 2022-12-24 ENCOUNTER — Other Ambulatory Visit: Payer: Self-pay | Admitting: Nurse Practitioner

## 2022-12-24 DIAGNOSIS — K589 Irritable bowel syndrome without diarrhea: Secondary | ICD-10-CM

## 2022-12-24 DIAGNOSIS — F410 Panic disorder [episodic paroxysmal anxiety] without agoraphobia: Secondary | ICD-10-CM

## 2022-12-24 NOTE — Telephone Encounter (Signed)
Several attempts have been made to reach the pt without success. Will await further communications from the pt.

## 2023-01-02 ENCOUNTER — Other Ambulatory Visit: Payer: Self-pay | Admitting: Internal Medicine

## 2023-01-02 DIAGNOSIS — I482 Chronic atrial fibrillation, unspecified: Secondary | ICD-10-CM

## 2023-01-08 ENCOUNTER — Other Ambulatory Visit: Payer: Self-pay | Admitting: Internal Medicine

## 2023-02-02 ENCOUNTER — Other Ambulatory Visit: Payer: Self-pay | Admitting: Internal Medicine

## 2023-02-02 DIAGNOSIS — K2100 Gastro-esophageal reflux disease with esophagitis, without bleeding: Secondary | ICD-10-CM

## 2023-02-05 ENCOUNTER — Other Ambulatory Visit: Payer: Self-pay | Admitting: Internal Medicine

## 2023-02-05 DIAGNOSIS — F410 Panic disorder [episodic paroxysmal anxiety] without agoraphobia: Secondary | ICD-10-CM

## 2023-03-01 ENCOUNTER — Ambulatory Visit: Payer: PPO | Admitting: Nurse Practitioner

## 2023-03-01 NOTE — Progress Notes (Deleted)
ASSESSMENT & PLAN   89 y.o. yo female known to Dr. Russella Dar (2021).  Previously evaluated for GERD and upper abdominal pain   History of ***.   See PMH for any additional medical history   HPI   Brief GI History         Chief complaint :      Procedure risk assessment:  No history of CHF.  No supplemental 02 use at home.  Not a known difficult airway Anticoagulant:    Previous GI Studies   **May not be a complete list of studies  September 2013 EGD for heartburn --Normal  September 2013 screening colonoscopy for family history of colon cancer chronic diarrhea -- Severe diverticulosis of the descending colon and sigmoid colon -- Diminutive sessile polyp removed from the sigmoid colon -- Random colon biopsies taken to evaluate for microscopic colitis  Surgical [P], random colon, bx - BENIGN COLONIC MUCOSA. - NO CHANGES OF MICROSCOPIC COLITIS, ACTIVE INFLAMMATION, GRANULOMAS, OR ADENOMATOUS CHANGES IDENTIFIED. 2. Surgical [P], sigmoid, polyp - BENIGN COLONIC MUCOSA WITH UNDERLYING LYMPHOID AGGREGATE. - NO ADENOMATOUS OR MALIGNANT CHANGES IDENTIFIED. 3. Surgical [P], duodenum, bx - HISTOLOGICALLY UNREMARKABLE DUODENAL MUCOSA. - NO CHANGES OF SPRUE, ACTIVE INFLAMMATION OR GRANULOMAS IDENTIFIED  Labs      Latest Ref Rng & Units 10/26/2022    6:31 AM 10/22/2022   11:00 AM 07/02/2022   12:00 AM  CBC  WBC 4.0 - 10.5 K/uL 6.3  9.2  10.5   Hemoglobin 12.0 - 15.0 g/dL 96.2  95.2  84.1   Hematocrit 36.0 - 46.0 % 38.7  40.2  38.4   Platelets 150 - 400 K/uL 209  242  312     Lab Results  Component Value Date   LIPASE 46 05/27/2020      Latest Ref Rng & Units 10/26/2022    6:31 AM 10/22/2022   11:00 AM 07/02/2022   12:00 AM  CMP  Glucose 70 - 99 mg/dL 324  401  027   BUN 8 - 23 mg/dL 11  17  16    Creatinine 0.44 - 1.00 mg/dL 2.53  6.64  4.03   Sodium 135 - 145 mmol/L 134  135  137   Potassium 3.5 - 5.1 mmol/L 3.5  4.3  4.3   Chloride 98 - 111 mmol/L 96  96  96    CO2 22 - 32 mmol/L 24  32  31   Calcium 8.9 - 10.3 mg/dL 9.7  47.4  25.9   Total Protein 6.5 - 8.1 g/dL 7.6  7.8  7.8   Total Bilirubin 0.3 - 1.2 mg/dL 0.8  0.4  0.4   Alkaline Phos 38 - 126 U/L 73     AST 15 - 41 U/L 29  16  16    ALT 0 - 44 U/L 20  12  17          MM 3D SCREENING MAMMOGRAM BILATERAL BREAST CLINICAL DATA:  Screening.  EXAM: DIGITAL SCREENING BILATERAL MAMMOGRAM WITH TOMOSYNTHESIS AND CAD  TECHNIQUE: Bilateral screening digital craniocaudal and mediolateral oblique mammograms were obtained. Bilateral screening digital breast tomosynthesis was performed. The images were evaluated with computer-aided detection.  COMPARISON:  Previous exam(s).  ACR Breast Density Category c: The breasts are heterogeneously dense, which may obscure small masses.  FINDINGS: There are no findings suspicious for malignancy.  IMPRESSION: No mammographic evidence of malignancy. A result letter of this screening mammogram will be mailed directly to the patient.  RECOMMENDATION: Screening mammogram in  one year. (Code:SM-B-01Y)  BI-RADS CATEGORY  1: Negative.  Electronically Signed   By: Baird Lyons M.D.   On: 11/16/2022 15:11    Past Medical History:  Diagnosis Date   Acute on chronic combined systolic and diastolic CHF (congestive heart failure) (HCC) 06/29/2019   Anxiety    Arthritis    Blood transfusion    C. difficile colitis    Candida esophagitis (HCC) 2013   EGD   Cataract    Diverticulosis of colon (without mention of hemorrhage) 2007   Colonoscopy   Emphysema of lung (HCC)    Family history of colon cancer    sister   Fibromyalgia    GERD (gastroesophageal reflux disease)    Headache(784.0)    Hyperlipemia    Hypertension    Impingement syndrome of left shoulder 09/25/2016   Impingement syndrome of right shoulder 09/25/2016   Iron deficiency anemia 05/31/2020   Kidney stones    Ptosis, bilateral    Renal infarct (HCC)    Splenic infarct     Past Surgical History:  Procedure Laterality Date   ABDOMINAL HYSTERECTOMY     BACK SURGERY     X2   BACK SURGERY     BREAST EXCISIONAL BIOPSY     CERVICAL DISC SURGERY     COLONOSCOPY     FACIAL FRACTURE SURGERY     HAND SURGERY     BIL    ORIF TRIPOD FRACTURE  07/13/2011   Procedure: OPEN REDUCTION INTERNAL FIXATION (ORIF) TRIPOD FRACTURE;  Surgeon: [name], MD;  Location: [facility] OR;  Service: ENT;  Laterality: Right;  ORIF RIGHT ZYGOMA, ORBITAL FLOOR EXPLORATION WITH FROST STITCH (TEMPORARY TARSORRHAPHY)   PTOSIS REPAIR Bilateral 11/22/2016   Procedure: INTERNAL PTOSIS REPAIR;  Surgeon: Floydene Flock, MD;  Location: [facility] OR;  Service: Ophthalmology;  Laterality: Bilateral;   SHOULDER ARTHROSCOPY W/ ROTATOR CUFF REPAIR     LFT   Family History  Problem Relation Age of Onset   Heart disease Mother    Heart disease Father    Heart attack Father    Breast cancer Sister 4   Colon cancer Sister    Cancer Sister        breast   Heart disease Sister    Cancer Sister        colon   Cancer Sister        colon   Hypertension Daughter    Hyperlipidemia Daughter    Social History   Tobacco Use   Smoking status: Never    Passive exposure: Never   Smokeless tobacco: Never  Vaping Use   Vaping status: Never Used  Substance Use Topics   Alcohol use: No   Drug use: No   Current Outpatient Medications  Medication Sig Dispense Refill   acetaminophen (TYLENOL) 500 MG tablet Take 1,000 mg by mouth every 8 (eight) hours as needed for headache.      amLODipine (NORVASC) 2.5 MG tablet TAKE 1 TABLET BY MOUTH DAILY 90 tablet 3   apixaban (ELIQUIS) 2.5 MG TABS tablet Take  1 tablet   2 x /day  for Afib  & to Prevent Blood Clots 180 tablet 3   atenolol (TENORMIN) 100 MG tablet TAKE 1 TABLET BY MOUTH DAILY 90 tablet 3   clobetasol cream (TEMOVATE) 0.05 % Apply to rash on Right Ear   3 to 4 x /day 60 g 3   clonazePAM (KLONOPIN) 0.5 MG tablet TAKE 1/2 TABLET   ONCE TO  TWICE DAILY  AS NEEDED FOR CHRONIC ANXIETY AND PANIC ATTACKS 20 tablet 0   conjugated estrogens (PREMARIN) vaginal cream Place 1 Applicatorful vaginally See admin instructions. Apply one applicatorful vaginally once a week     Cyanocobalamin (VITAMIN B 12 PO) Take 1 tablet by mouth daily.     diclofenac Sodium (VOLTAREN) 1 % GEL APPLY 2 GRAMS TOPICALLY 3-4 TIMES DAILY AS NEEDED FOR PAIN 350 g 3   dicyclomine (BENTYL) 20 MG tablet TAKE 1 TABLET BY MOUTH FOUR TIMES DAILY WITH MEALS AND AT BEDTIME FOR CRAMPING NAUSEA OR BLOATING 360 tablet 1   escitalopram (LEXAPRO) 10 MG tablet Take 1 tablet Daily for Mood & Chronic Anxiety 90 tablet 3   gabapentin (NEURONTIN) 600 MG tablet TAKE 1/2 TO 1 TABLET BY MOUTH 3 TIMES DAILY FOR FACIAL PAIN 270 tablet 1   lovastatin (MEVACOR) 20 MG tablet TAKE 1 TABLET BY MOUTH EACH NIGHT AT BEDTIME FOR CHOLESTEROL 90 tablet 3   olmesartan (BENICAR) 40 MG tablet TAKE 1 TABLET BY MOUTH DAILY FOR BLOOD PRESSURE 90 tablet 3   omeprazole (PRILOSEC) 40 MG capsule Take                                                                                                                                                                                                             1 capsule   Daily  to Prevent  Heartburn & Indigestion                                                         /                                                                   TAKE                                         BY  MOUTH 90 capsule 3   polyvinyl alcohol (LIQUIFILM TEARS) 1.4 % ophthalmic solution Place 1 drop into both eyes 2 (two) times daily as needed for dry eyes.     triamcinolone (KENALOG) 0.1 % paste Apply to Lip Ulcers 3 to 4 x/day (Patient taking differently: Use as directed 1 Application in the mouth or throat in the morning, at noon, in the evening, and at bedtime. Apply to Lip Ulcers 3 to 4 x/day) 10 g 3   Vaginal Lubricant (REPLENS) GEL Place 1 Applicatorful  vaginally daily.     Vitamin D-Vitamin K (VITAMIN K2-VITAMIN D3 PO) Take 1 tablet by mouth daily. 90 mcg of K2, 5,000 units of D3     No current facility-administered medications for this visit.   Allergies  Allergen Reactions   Latex Hives    TONGUE HAD BLISTERS WHEN HAD DENTAL SURGERY   Amoxicillin Nausea And Vomiting   Aspirin Other (See Comments)    Gi upset   Barbiturates Other (See Comments)    Unknown reaction   Celebrex [Celecoxib] Other (See Comments)    Unknown reaction   Codeine Itching   Evista [Raloxifene] Other (See Comments)    Unknown reaction   Lipitor [Atorvastatin] Other (See Comments)    Unknown reaction   Morphine And Codeine Other (See Comments)    Unknown reaction   Oruvail [Ketoprofen] Other (See Comments)    Unknown reaction   Pantoprazole Diarrhea   Paraffin Other (See Comments)    Unknown reaction   Prochlorperazine Other (See Comments)    Uncontrolled shaking   Proloprim [Trimethoprim] Other (See Comments)    Unknown reaction   Venlafaxine Nausea Only   Vibra-Tab [Doxycycline] Other (See Comments)    Unknown reaction   Vioxx [Rofecoxib] Other (See Comments)    edema   Betadine [Povidone Iodine] Rash   Caffeine Palpitations     Review of Systems: Positive for ***.  All other systems reviewed and negative except where noted in HPI.   Wt Readings from Last 3 Encounters:  12/19/22 107 lb (48.5 kg)  12/03/22 108 lb 9.6 oz (49.3 kg)  11/01/22 110 lb 6.4 oz (50.1 kg)    Physical Exam:  There were no vitals taken for this visit. Constitutional:  Pleasant, generally well appearing ***female in no acute distress. Psychiatric:  Normal mood and affect. Behavior is normal. EENT: Pupils normal.  Conjunctivae are normal. No scleral icterus. Neck supple.  Cardiovascular: Normal rate, regular rhythm.  Pulmonary/chest: Effort normal and breath sounds normal. No wheezing, rales or rhonchi. Abdominal: Soft, nondistended, nontender. Bowel sounds active  throughout. There are no masses palpable. No hepatomegaly. Neurological: Alert and oriented to person place and time. Extremities: *** No edema Skin: Skin is warm and dry. No rashes noted.  Willette Cluster, NP  03/01/2023, 8:37 AM  Cc:  Referring Provider Lucky Cowboy, MD

## 2023-03-12 ENCOUNTER — Other Ambulatory Visit: Payer: Self-pay | Admitting: Internal Medicine

## 2023-03-12 DIAGNOSIS — F410 Panic disorder [episodic paroxysmal anxiety] without agoraphobia: Secondary | ICD-10-CM

## 2023-04-12 ENCOUNTER — Other Ambulatory Visit: Payer: Self-pay | Admitting: Nurse Practitioner

## 2023-04-12 DIAGNOSIS — F410 Panic disorder [episodic paroxysmal anxiety] without agoraphobia: Secondary | ICD-10-CM

## 2023-04-25 ENCOUNTER — Ambulatory Visit: Payer: PPO | Admitting: Internal Medicine

## 2023-04-25 ENCOUNTER — Encounter: Payer: Self-pay | Admitting: Internal Medicine

## 2023-04-25 NOTE — Progress Notes (Unsigned)
Future Appointments  Date Time Provider Department  04/26/2023 10:30 AM Lucky Cowboy, MD [facility]  04/29/2023 11:40 AM Croitoru, Rachelle Hora, MD [facility]  07/03/2023                       cpe 10:00 AM Adela Glimpse, NP [facility]  10/22/2023                     wellness 10:00 AM Raynelle Dick, NP [facility]      History of Present Illness:       This very nice 89 y.o. recently widowed WF  (March 2023)  with HTN, HLD, Pre-Diabetes and Vitamin D Deficiency  presents for 6 month follow up .                                                      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 Patient take Gabapentin for this.            Patient is treated for HTN  since  1990 & BP has been controlled at home. Today's  . In 2019, she was started on Eliquis for hx/o Splenic and Rt Renal infarcts and in June 2020, she was discovered in Afib.  Patient has had no complaints of any cardiac type chest pain, palpitations, dyspnea Pollyann Kennedy /PND, dizziness, claudication or dependent edema.        Hyperlipidemia is controlled with diet & meds. Patient denies myalgias or other med SE's. Last Lipids were at goal :   Lab Results  Component Value Date   CHOL 177 10/22/2022   HDL 92 10/22/2022   LDLCALC 61 10/22/2022   TRIG 166 (H) 10/22/2022   CHOLHDL 1.9 10/22/2022     Also, the patient has history of  T2-DM (A1c 6.8%/2001)  and after a 30# weight loss after her facial fx,  her  A1c 5.8%/2013 as in the preDiabetic range and then Normal - 5.3% in 2016 . In essence she is a diet controlled T2_DM with CKD 2  (GFR 78) .  She  has had no symptoms of reactive hypoglycemia, diabetic polys, paresthesias or visual blurring.  Last A1c was  still in the early or PreDiabetic range :  Lab Results  Component Value Date   HGBA1C 6.1 (H) 07/02/2022                                                 Further, the patient also has  history of Vitamin D Deficiency and supplements Vitamin D . Last vitamin D was at goal :   Lab Results  Component Value Date   VD25OH 79 07/02/2022       Current Outpatient Medications on File Prior to Visit  Medication Sig   acetaminophen  500 MG tablet Take 1,000 mg  every 8 (eight) hours as needed for headache.    amLODipine 2.5 MG tablet Take 1 tablet daily.   apixaban (ELIQUIS) 2.5 MG TABS tablet Take  1 Tablet  2 x /day    atenolol (  100 MG tablet Take 1 tablet daily    clonazePAM 0.5 MG tablet TAKE 1/2 TO 1 TABLET 2-3 TIMES DAILY AS NEEDED FOR ANXIETY    PREMARIN vaginal cream Place vaginally  once a week   VITAMIN B 12  Take 1 tablet daily.   diclofenac  1 % GEL APPLY 2 GRAMS TOPICALLY 3-4 TIMES DAILY AS NEEDED FOR PAIN   dicyclomine (BENTYL) 20 MG tablet TAKE 1 TABLET  FOUR TIMES DAILY WITH MEALS AND BEDTIME    Gabapentin 600 MG tablet TAKE 1/2 TO 1 TABLET  3 TIMES DAILY FOR FACIAL PAIN   lovastatin  20 MG tablet TAKE 1 TABLET EACH NIGHT A   olmesartan 40 MG tablet TAKE 1 TABLET DAILY    omeprazole  40 MG capsule TAKE 1 CAPSULE  DAILY B   LIQUIFILM TEARS 1.4 % ophth soln Place 1 drop into both eyes 2  times daily as needed   Vaginal Lubricant /REPLENS GEL Place vaginally daily.   VIT\\ K2    90 mcg -VIT D 5,000 units  Take daily      Allergies  Allergen Reactions   Latex Hives    TONGUE HAD BLISTERS WHEN HAD DENTAL SURGERY   Amoxicillin Nausea And Vomiting   Aspirin Other (See Comments)    Gi upset   Barbiturates Other (See Comments)    Unknown reaction   Celebrex [Celecoxib] Other (See Comments)    Unknown reaction   Codeine Itching   Evista [Raloxifene] Other (See Comments)    Unknown reaction   Lipitor [Atorvastatin] Other (See Comments)    Unknown reaction   Morphine And Related Other (See Comments)    Unknown reaction   Oruvail [Ketoprofen] Other (See Comments)    Unknown reaction   Pantoprazole Diarrhea   Paraffin Other (See Comments)    Unknown  reaction   Prochlorperazine Other (See Comments)    Uncontrolled shaking   Proloprim [Trimethoprim] Other (See Comments)    Unknown reaction   Venlafaxine Nausea Only   Vibra-Tab [Doxycycline] Other (See Comments)    Unknown reaction   Vioxx [Rofecoxib] Other (See Comments)    edema   Betadine [Povidone Iodine] Rash   Caffeine Palpitations     PMHx:   Past Medical History:  Diagnosis Date   Acute on chronic combined systolic and diastolic CHF (congestive heart failure) (HCC) 06/29/2019   Anxiety    Arthritis    Blood transfusion    C. difficile colitis    Candida esophagitis (HCC) 2013   EGD   Cataract    Chronic kidney disease    STONES   Diverticulitis 04/05/2017   Diverticulosis of colon (without mention of hemorrhage) 2007   Colonoscopy   Dysrhythmia    RX   Emphysema of lung (HCC)    Family history of colon cancer    sister   Fibromyalgia    Fracture, zygoma closed (HCC) 07/13/2011   GERD (gastroesophageal reflux disease)    Headache(784.0)    Hyperlipemia    Hypertension    Impingement syndrome of left shoulder 09/25/2016   Impingement syndrome of right shoulder 09/25/2016   Iron deficiency anemia 05/31/2020   Kidney stones    Ptosis, bilateral    Renal infarct [facility])    Splenic infarct      Immunization History  Administered Date(s) Administered   Influenza, High Dose 06/02/2018, 03/12/2019, 03/23/2020, 05/02/2021   Influenza 03/09/2013, 03/11/2015   PFIZER-SARS-COV-2 Vacc 06/01/2020, 06/22/2020   Pneumococcal -13 12/21/2015   Pneumococcal -  23 06/25/2002   Td 06/26/2003     Past Surgical History:  Procedure Laterality Date   ABDOMINAL HYSTERECTOMY     BACK SURGERY     X2   BACK SURGERY     BREAST EXCISIONAL BIOPSY     CERVICAL DISC SURGERY     COLONOSCOPY     FACIAL FRACTURE SURGERY     HAND SURGERY     BIL    ORIF TRIPOD FRACTURE  07/13/2011   Procedure: OPEN REDUCTION INTERNAL FIXATION (ORIF) TRIPOD FRACTURE;  Surgeon: [name], MD;   Location: [facility] OR;  Service: ENT;  Laterality: Right;  ORIF RIGHT ZYGOMA, ORBITAL FLOOR EXPLORATION WITH FROST STITCH (TEMPORARY TARSORRHAPHY)   PTOSIS REPAIR Bilateral 11/22/2016   Procedure: INTERNAL PTOSIS REPAIR;  Surgeon: Floydene Flock, MD;  Location: [facility] OR;  Service: Ophthalmology;  Laterality: Bilateral;   SHOULDER ARTHROSCOPY W/ ROTATOR CUFF REPAIR     LFT    FHx:    Reviewed / unchanged  SHx:    Reviewed / unchanged   Systems Review:  Constitutional: Denies fever, chills, wt changes, headaches, insomnia, fatigue, night sweats, change in appetite. Eyes: Denies redness, blurred vision, diplopia, discharge, itchy, watery eyes.  ENT: Denies discharge, congestion, post nasal drip, epistaxis, sore throat, earache, hearing loss, dental pain, tinnitus, vertigo, sinus pain, snoring.  CV: Denies chest pain, palpitations, irregular heartbeat, syncope, dyspnea, diaphoresis, orthopnea, PND, claudication or edema. Respiratory: denies cough, dyspnea, DOE, pleurisy, hoarseness, laryngitis, wheezing.  Gastrointestinal: Denies dysphagia, odynophagia, heartburn, reflux, water brash, abdominal pain or cramps, nausea, vomiting, bloating, diarrhea, constipation, hematemesis, melena, hematochezia  or hemorrhoids. Genitourinary: Denies dysuria, frequency, urgency, nocturia, hesitancy, discharge, hematuria or flank pain. Musculoskeletal: Denies arthralgias, myalgias, stiffness, jt. swelling, pain, limping or strain/sprain.  Skin: Denies pruritus, rash, hives, warts, acne, eczema or change in skin lesion(s). Neuro: No weakness, tremor, incoordination, spasms, paresthesia or pain. Psychiatric: Denies confusion, memory loss or sensory loss. Endo: Denies change in weight, skin or hair change.  Heme/Lymph: No excessive bleeding, bruising or enlarged lymph nodes.  Physical Exam  There were no vitals taken for this visit.  Appears  well nourished, well groomed  and in no distress.  Eyes: PERRLA, EOMs,  conjunctiva no swelling or erythema. Sinuses: No frontal/maxillary tenderness ENT/Mouth: EAC's clear, TM's nl w/o erythema, bulging. Nares clear w/o erythema, swelling, exudates. Oropharynx clear without erythema or exudates. Oral hygiene is good. Tongue normal, non obstructing. Hearing intact.  Neck: Supple. Thyroid not palpable. Car 2+/2+ without bruits, nodes or JVD. Chest: Respirations nl with BS clear & equal w/o rales, rhonchi, wheezing or stridor.  Cor: Heart sounds normal w/ regular rate and rhythm without sig. murmurs, gallops, clicks or rubs. Peripheral pulses normal and equal  without edema.  Abdomen: Soft & bowel sounds normal. Non-tender w/o guarding, rebound, hernias, masses or organomegaly.  Lymphatics: Unremarkable.  Musculoskeletal: Full ROM all peripheral extremities, joint stability, 5/5 strength and normal gait.  Skin: Warm, dry without exposed rashes or   lesions.  Few ecchymosis of dorsal forearms.  Neuro: Cranial nerves intact, reflexes equal bilaterally. Sensory-motor testing grossly intact. Tendon reflexes grossly intact.  Pysch: Alert & oriented x 3.  Insight and judgement nl & appropriate. No ideations.  Assessment and Plan:  1. Essential hypertension  - Continue medication, monitor blood pressure at home.  - Continue DASH diet.  Reminder to go to the ER if any CP,  SOB, nausea, dizziness, severe HA, changes vision/speech.    - CBC with Differential/Platelet -  COMPLETE METABOLIC PANEL WITH GFR - Magnesium - TSH   2. Hyperlipidemia associated with type 2 diabetes mellitus (HCC)   - Continue diet/meds, exercise,& lifestyle modifications.  - Continue monitor periodic cholesterol/liver & renal functions     - Lipid panel - TSH   3. Type 2 diabetes mellitus with stage 3b chronic kidney                                           disease, without long-term current use of insulin (HCC)  - Continue diet, exercise  - Lifestyle modifications.  - Monitor  appropriate labs    - Hemoglobin A1c - Insulin, random   4. Vitamin D deficiency  - continue supplementation   - VITAMIN D 25 Hydroxy   5. Aortic atherosclerosis (HCC) by Abd CT scan (05/2020)   - Lipid panel   6. Thrombophilia (HCC)  - CBC with Differential/Platelet   7. Medication management  - CBC with Differential/Platelet - COMPLETE METABOLIC PANEL WITH GFR - Magnesium - Lipid panel - TSH - Hemoglobin A1c - Insulin, random - VITAMIN D 25 Hydroxy          Discussed  regular exercise, BP monitoring, weight control to achieve/maintain BMI less than 25 and discussed med and SE's. Recommended labs to assess /monitor clinical status .  I discussed the assessment and treatment plan with the patient. The patient was provided an opportunity to ask questions and all were answered. The patient agreed with the plan and demonstrated an understanding of the instructions.  I provided over 30 minutes of exam, counseling, chart review and  complex critical decision making.        The patient was advised to call back or seek an in-person evaluation if the symptoms worsen or if the condition fails to improve as anticipated.   Marinus Maw, MD .

## 2023-04-25 NOTE — Patient Instructions (Signed)

## 2023-04-26 ENCOUNTER — Ambulatory Visit (INDEPENDENT_AMBULATORY_CARE_PROVIDER_SITE_OTHER): Payer: PPO | Admitting: Internal Medicine

## 2023-04-26 ENCOUNTER — Encounter: Payer: Self-pay | Admitting: Internal Medicine

## 2023-04-26 VITALS — BP 128/68 | HR 56 | Temp 97.9°F | Resp 16 | Ht 63.25 in | Wt 114.2 lb

## 2023-04-26 DIAGNOSIS — E1122 Type 2 diabetes mellitus with diabetic chronic kidney disease: Secondary | ICD-10-CM | POA: Diagnosis not present

## 2023-04-26 DIAGNOSIS — I10 Essential (primary) hypertension: Secondary | ICD-10-CM | POA: Diagnosis not present

## 2023-04-26 DIAGNOSIS — E1169 Type 2 diabetes mellitus with other specified complication: Secondary | ICD-10-CM | POA: Diagnosis not present

## 2023-04-26 DIAGNOSIS — E559 Vitamin D deficiency, unspecified: Secondary | ICD-10-CM

## 2023-04-26 DIAGNOSIS — I700 Atherosclerosis of aorta: Secondary | ICD-10-CM

## 2023-04-26 DIAGNOSIS — E785 Hyperlipidemia, unspecified: Secondary | ICD-10-CM | POA: Diagnosis not present

## 2023-04-26 DIAGNOSIS — D6859 Other primary thrombophilia: Secondary | ICD-10-CM

## 2023-04-26 DIAGNOSIS — N1832 Chronic kidney disease, stage 3b: Secondary | ICD-10-CM

## 2023-04-26 DIAGNOSIS — Z23 Encounter for immunization: Secondary | ICD-10-CM | POA: Diagnosis not present

## 2023-04-26 DIAGNOSIS — Z79899 Other long term (current) drug therapy: Secondary | ICD-10-CM | POA: Diagnosis not present

## 2023-04-26 DIAGNOSIS — I4891 Unspecified atrial fibrillation: Secondary | ICD-10-CM

## 2023-04-27 NOTE — Progress Notes (Signed)
<>*<>*<>*<>*<>*<>*<>*<>*<>*<>*<>*<>*<>*<>*<>*<>*<>*<>*<>*<>*<>*<>*<>*<>*<> <>*<>*<>*<>*<>*<>*<>*<>*<>*<>*<>*<>*<>*<>*<>*<>*<>*<>*<>*<>*<>*<>*<>*<>*<>  -    Chol = 191 -  Excellent   - Very low risk for Heart Attack  / Stroke  <>*<>*<>*<>*<>*<>*<>*<>*<>*<>*<>*<>*<>*<>*<>*<>*<>*<>*<>*<>*<>*<>*<>*<>*<> <>*<>*<>*<>*<>*<>*<>*<>*<>*<>*<>*<>*<>*<>*<>*<>*<>*<>*<>*<>*<>*<>*<>*<>*<>  -  Vitamin D = 93 - Excellent  - Please keep dose same   <>*<>*<>*<>*<>*<>*<>*<>*<>*<>*<>*<>*<>*<>*<>*<>*<>*<>*<>*<>*<>*<>*<>*<>*<> <>*<>*<>*<>*<>*<>*<>*<>*<>*<>*<>*<>*<>*<>*<>*<>*<>*<>*<>*<>*<>*<>*<>*<>*<>  -  All Else - CBC - Kidneys - Electrolytes - Liver - Magnesium & Thyroid    - all  Normal / OK  <>*<>*<>*<>*<>*<>*<>*<>*<>*<>*<>*<>*<>*<>*<>*<>*<>*<>*<>*<>*<>*<>*<>*<>*<> <>*<>*<>*<>*<>*<>*<>*<>*<>*<>*<>*<>*<>*<>*<>*<>*<>*<>*<>*<>*<>*<>*<>*<>*<>

## 2023-04-28 LAB — LIPID PANEL
Cholesterol: 171 mg/dL (ref <200)
HDL: 81 mg/dL (ref >=50)
LDL Cholesterol (Calc): 67 mg/dL
Non-HDL Cholesterol (Calc): 90 mg/dL (ref <130)
Total CHOL/HDL Ratio: 2.1 (calc) (ref <5.0)
Triglycerides: 149 mg/dL (ref <150)

## 2023-04-28 LAB — COMPLETE METABOLIC PANEL WITH GFR
AG Ratio: 2 (calc) (ref 1.0–2.5)
ALT: 11 U/L (ref 6–29)
AST: 18 U/L (ref 10–35)
Albumin: 4.9 g/dL (ref 3.6–5.1)
Alkaline phosphatase (APISO): 110 U/L (ref 37–153)
BUN: 20 mg/dL (ref 7–25)
CO2: 30 mmol/L (ref 20–32)
Calcium: 9.5 mg/dL (ref 8.6–10.4)
Chloride: 96 mmol/L — ABNORMAL LOW (ref 98–110)
Creat: 0.9 mg/dL (ref 0.60–0.95)
Globulin: 2.5 g/dL (ref 1.9–3.7)
Glucose, Bld: 82 mg/dL (ref 65–139)
Potassium: 4.8 mmol/L (ref 3.5–5.3)
Sodium: 134 mmol/L — ABNORMAL LOW (ref 135–146)
Total Bilirubin: 0.4 mg/dL (ref 0.2–1.2)
Total Protein: 7.4 g/dL (ref 6.1–8.1)
eGFR: 62 mL/min/{1.73_m2} (ref >=60)

## 2023-04-28 LAB — CBC WITH DIFFERENTIAL/PLATELET
Absolute Lymphocytes: 1435 {cells}/uL (ref 850–3900)
Absolute Monocytes: 504 {cells}/uL (ref 200–950)
Basophils Absolute: 38 {cells}/uL (ref 0–200)
Basophils Relative: 0.8 %
Eosinophils Absolute: 130 {cells}/uL (ref 15–500)
Eosinophils Relative: 2.7 %
HCT: 35.7 % (ref 35.0–45.0)
Hemoglobin: 11.9 g/dL (ref 11.7–15.5)
MCH: 32.3 pg (ref 27.0–33.0)
MCHC: 33.3 g/dL (ref 32.0–36.0)
MCV: 97 fL (ref 80.0–100.0)
MPV: 10.1 fL (ref 7.5–12.5)
Monocytes Relative: 10.5 %
Neutro Abs: 2693 {cells}/uL (ref 1500–7800)
Neutrophils Relative %: 56.1 %
Platelets: 192 10*3/uL (ref 140–400)
RBC: 3.68 10*6/uL — ABNORMAL LOW (ref 3.80–5.10)
RDW: 11.9 % (ref 11.0–15.0)
Total Lymphocyte: 29.9 %
WBC: 4.8 10*3/uL (ref 3.8–10.8)

## 2023-04-28 LAB — VITAMIN D 25 HYDROXY (VIT D DEFICIENCY, FRACTURES): Vit D, 25-Hydroxy: 93 ng/mL (ref 30–100)

## 2023-04-28 LAB — HEMOGLOBIN A1C
Hgb A1c MFr Bld: 5.9 %{Hb} — ABNORMAL HIGH (ref <5.7)
Mean Plasma Glucose: 123 mg/dL
eAG (mmol/L): 6.8 mmol/L

## 2023-04-28 LAB — TSH: TSH: 0.94 m[IU]/L (ref 0.40–4.50)

## 2023-04-28 LAB — MAGNESIUM: Magnesium: 1.9 mg/dL (ref 1.5–2.5)

## 2023-04-28 LAB — INSULIN, RANDOM: Insulin: 5 u[IU]/mL

## 2023-04-29 ENCOUNTER — Encounter: Payer: Self-pay | Admitting: Cardiovascular Disease

## 2023-04-29 ENCOUNTER — Ambulatory Visit: Payer: PPO | Attending: Cardiovascular Disease | Admitting: Cardiovascular Disease

## 2023-04-29 VITALS — BP 139/64 | HR 78 | Ht 64.0 in | Wt 115.4 lb

## 2023-04-29 DIAGNOSIS — I771 Stricture of artery: Secondary | ICD-10-CM

## 2023-04-29 DIAGNOSIS — E7800 Pure hypercholesterolemia, unspecified: Secondary | ICD-10-CM | POA: Diagnosis not present

## 2023-04-29 DIAGNOSIS — I10 Essential (primary) hypertension: Secondary | ICD-10-CM | POA: Diagnosis not present

## 2023-04-29 DIAGNOSIS — I700 Atherosclerosis of aorta: Secondary | ICD-10-CM | POA: Diagnosis not present

## 2023-04-29 DIAGNOSIS — I4819 Other persistent atrial fibrillation: Secondary | ICD-10-CM

## 2023-04-29 NOTE — Patient Instructions (Signed)
Medication Instructions:  Your physician recommends that you continue on your current medications as directed. Please refer to the Current Medication list given to you today.  *If you need a refill on your cardiac medications before your next appointment, please call your pharmacy*   Lab Work: None  Testing/Procedures: None   Follow-Up: At [facility] you and your health needs are our priority.  As part of our continuing mission to provide you with exceptional heart care, we have created designated Provider Care Teams.  These Care Teams include your primary Cardiologist (physician) and Advanced Practice Providers (APPs -  Physician Assistants and Nurse Practitioners) who all work together to provide you with the care you need, when you need it.  We recommend signing up for the patient portal called "MyChart".  Sign up information is provided on this After Visit Summary.  MyChart is used to connect with patients for Virtual Visits (Telemedicine).  Patients are able to view lab/test results, encounter notes, upcoming appointments, etc.  Non-urgent messages can be sent to your provider as well.   To learn more about what you can do with MyChart, go to [email or web address].    Your next appointment:   12 month(s)  Provider:   Thurmon Fair, MD

## 2023-04-29 NOTE — Progress Notes (Unsigned)
Cardiology Office Note   Date:  05/02/2023   ID:  Debra White, DOB 1934-03-17, MRN [id number]  PCP:  Lucky Cowboy, MD  Cardiologist:   Thurmon Fair, MD   Chief Complaint  Patient presents with   Atrial Fibrillation      History of Present Illness: Debra White is a 89 y.o. female with paroxysmal atrial fibrillation (which now appears to be persistent), hypertension, emphysema, chronic hyponatremia, atherosclerosis of the aorta and renal/splenic infarcts which were demonstrated to be likely secondary to paroxysmal atrial fibrillation.  Her husband Debra White was also my patient.  She is accompanied by a family member.  She is very hard of hearing.  She really does not have any cardiovascular complaints.  She is not aware of palpitations.  She is slowly adjusting to the loss of her husband.  He had dementia and was bedridden for a long time and eventually died due to complications of COVID-19 infection.  Her appetite remains poor, but she stopped losing weight.  Her BMI remains around 20.  She denies chest pain or shortness of breath at rest or with activity, dizziness, syncope, falls, injuries or bleeding, abdominal pain, new focal neurological complaints, edema or claudication.  She does not have orthopnea or PND. She is on dose adjusted apixaban due to age and small body size.  She has not had falls or bleeding problems.  Labs performed just a couple of days ago show LDL cholesterol of 67, borderline hemoglobin A1c of 5.9%, normal kidney function.  Previous CT of the abdomen shows extensive atherosclerosis of the aorta as well diverticulosis.   She had an echo 09/2019 that revealed LVEF 55 to 60% with indeterminate diastolic function.  She has severe biatrial dilation, mild mitral insufficiency, moderate tricuspid insufficiency and aortic valve sclerosis without stenosis.   Past Medical History:  Diagnosis Date   Acute on chronic combined systolic and diastolic CHF  (congestive heart failure) (HCC) 06/29/2019   Anxiety    Arthritis    Blood transfusion    C. difficile colitis    Candida esophagitis (HCC) 2013   EGD   Cataract    Diverticulosis of colon (without mention of hemorrhage) 2007   Colonoscopy   Emphysema of lung (HCC)    Family history of colon cancer    sister   Fibromyalgia    GERD (gastroesophageal reflux disease)    Headache(784.0)    Hyperlipemia    Hypertension    Impingement syndrome of left shoulder 09/25/2016   Impingement syndrome of right shoulder 09/25/2016   Iron deficiency anemia 05/31/2020   Kidney stones    Ptosis, bilateral    Renal infarct (HCC)    Splenic infarct     Past Surgical History:  Procedure Laterality Date   ABDOMINAL HYSTERECTOMY     BACK SURGERY     X2   BACK SURGERY     BREAST EXCISIONAL BIOPSY     CERVICAL DISC SURGERY     COLONOSCOPY     FACIAL FRACTURE SURGERY     HAND SURGERY     BIL    ORIF TRIPOD FRACTURE  07/13/2011   Procedure: OPEN REDUCTION INTERNAL FIXATION (ORIF) TRIPOD FRACTURE;  Surgeon: [name], MD;  Location: [facility] OR;  Service: ENT;  Laterality: Right;  ORIF RIGHT ZYGOMA, ORBITAL FLOOR EXPLORATION WITH FROST STITCH (TEMPORARY TARSORRHAPHY)   PTOSIS REPAIR Bilateral 11/22/2016   Procedure: INTERNAL PTOSIS REPAIR;  Surgeon: Floydene Flock, MD;  Location: [facility] OR;  Service: Ophthalmology;  Laterality: Bilateral;   SHOULDER ARTHROSCOPY W/ ROTATOR CUFF REPAIR     LFT     Current Outpatient Medications  Medication Sig Dispense Refill   acetaminophen (TYLENOL) 500 MG tablet Take 1,000 mg by mouth every 8 (eight) hours as needed for headache.      amLODipine (NORVASC) 2.5 MG tablet TAKE 1 TABLET BY MOUTH DAILY 90 tablet 3   apixaban (ELIQUIS) 2.5 MG TABS tablet Take  1 tablet   2 x /day  for Afib  & to Prevent Blood Clots 180 tablet 3   atenolol (TENORMIN) 100 MG tablet TAKE 1 TABLET BY MOUTH DAILY 90 tablet 3   clobetasol cream (TEMOVATE) 0.05 % Apply to rash on Right  Ear   3 to 4 x /day 60 g 3   clonazePAM (KLONOPIN) 0.5 MG tablet TAKE 1/2 TABLET BY MOUTH ONCE TO TWICE DAILY AS NEEDED FOR CHRONIC ANXIETY AND PANIC ATTACKS 20 tablet 0   conjugated estrogens (PREMARIN) vaginal cream Place 1 Applicatorful vaginally See admin instructions. Apply one applicatorful vaginally once a week     Cyanocobalamin (VITAMIN B 12 PO) Take 1 tablet by mouth daily.     diclofenac Sodium (VOLTAREN) 1 % GEL APPLY 2 GRAMS TOPICALLY 3-4 TIMES DAILY AS NEEDED FOR PAIN 350 g 3   dicyclomine (BENTYL) 20 MG tablet TAKE 1 TABLET BY MOUTH FOUR TIMES DAILY WITH MEALS AND AT BEDTIME FOR CRAMPING NAUSEA OR BLOATING 360 tablet 1   escitalopram (LEXAPRO) 10 MG tablet Take 1 tablet Daily for Mood & Chronic Anxiety 90 tablet 3   gabapentin (NEURONTIN) 600 MG tablet TAKE 1/2 TO 1 TABLET BY MOUTH 3 TIMES DAILY FOR FACIAL PAIN 270 tablet 1   lovastatin (MEVACOR) 20 MG tablet TAKE 1 TABLET BY MOUTH EACH NIGHT AT BEDTIME FOR CHOLESTEROL 90 tablet 3   olmesartan (BENICAR) 40 MG tablet TAKE 1 TABLET BY MOUTH DAILY FOR BLOOD PRESSURE 90 tablet 3   omeprazole (PRILOSEC) 40 MG capsule Take                                                                                                                                                                                                             1 capsule   Daily  to Prevent  Heartburn & Indigestion                                                         /  TAKE                                         BY                                                 MOUTH 90 capsule 3   polyvinyl alcohol (LIQUIFILM TEARS) 1.4 % ophthalmic solution Place 1 drop into both eyes 2 (two) times daily as needed for dry eyes.     triamcinolone (KENALOG) 0.1 % paste Apply to Lip Ulcers 3 to 4 x/day (Patient taking differently: Use as directed 1 Application in the mouth or throat in the morning, at noon, in the evening, and at  bedtime. Apply to Lip Ulcers 3 to 4 x/day) 10 g 3   Vaginal Lubricant (REPLENS) GEL Place 1 Applicatorful vaginally daily.     Vitamin D-Vitamin K (VITAMIN K2-VITAMIN D3 PO) Take 1 tablet by mouth daily. 90 mcg of K2, 5,000 units of D3     No current facility-administered medications for this visit.    Allergies:   Latex, Amoxicillin, Aspirin, Barbiturates, Celebrex [celecoxib], Codeine, Evista [raloxifene], Lipitor [atorvastatin], Morphine and codeine, Oruvail [ketoprofen], Pantoprazole, Paraffin, Prochlorperazine, Proloprim [trimethoprim], Venlafaxine, Vibra-tab [doxycycline], Vioxx [rofecoxib], Betadine [povidone iodine], and Caffeine    Social History:  The patient  reports that she has never smoked. She has never been exposed to tobacco smoke. She has never used smokeless tobacco. She reports that she does not drink alcohol and does not use drugs.   Family History:  The patient's family history includes Breast cancer (age of onset: 62) in her sister; Cancer in her sister, sister, and sister; Colon cancer in her sister; Heart attack in her father; Heart disease in her father, mother, and sister; Hyperlipidemia in her daughter; Hypertension in her daughter.    ROS:  Please see the history of present illness.   Otherwise, review of systems are positive for none.   All other systems are reviewed and negative.    PHYSICAL EXAM: VS:  BP 139/64   Pulse 78   Ht 5\' 4"  (1.626 m)   Wt 115 lb 6.4 oz (52.3 kg)   SpO2 97%   BMI 19.81 kg/m  , BMI Body mass index is 19.81 kg/m.  General: Alert, oriented x3, no distress, very lean, appears rather frail Head: no evidence of trauma, PERRL, EOMI, no exophtalmos or lid lag, no myxedema, no xanthelasma; normal ears, nose and oropharynx Neck: normal jugular venous pulsations and no hepatojugular reflux; brisk carotid pulses without delay and no carotid bruits Chest: clear to auscultation, no signs of consolidation by percussion or palpation, normal  fremitus, symmetrical and full respiratory excursions Cardiovascular: normal position and quality of the apical impulse, irregular rhythm, normal first and second heart sounds, no murmurs, rubs or gallops Abdomen: no tenderness or distention, no masses by palpation, no abnormal pulsatility or arterial bruits, normal bowel sounds, no hepatosplenomegaly Extremities: no clubbing, cyanosis or edema; 2+ radial, ulnar and brachial pulses bilaterally; 2+ right femoral, posterior tibial and dorsalis pedis pulses; 2+ left femoral, posterior tibial and dorsalis pedis pulses; no subclavian or femoral bruits Neurological: grossly nonfocal Psych: Normal mood and affect    EKG:   EKG Interpretation Date/Time:  Monday April 29 2023 12:19:42  EST Ventricular Rate:  78 PR Interval:    QRS Duration:  78 QT Interval:  406 QTC Calculation: 462 R Axis:   16  Text Interpretation: Atrial fibrillation No significant change since last tracing Confirmed by Dustine Stickler ([id number]) on 04/29/2023 1:29:49 PM        Echo 10/11/2019  1. Left ventricular ejection fraction, by estimation, is 55 to 60%. The  left ventricle has normal function. The left ventricle has no regional  wall motion abnormalities. Left ventricular diastolic function could not  be evaluated.   2. Right ventricular systolic function is normal. The right ventricular  size is normal. There is normal pulmonary artery systolic pressure. The  estimated right ventricular systolic pressure is 30.2 mmHg.   3. Left atrial size was severely dilated.   4. Right atrial size was severely dilated.   5. The mitral valve is grossly normal. Mild mitral valve regurgitation.  No evidence of mitral stenosis.   6. Tricuspid valve regurgitation is moderate.   7. The aortic valve is tricuspid. Aortic valve regurgitation is not  visualized. Mild aortic valve sclerosis is present, with no evidence of  aortic valve stenosis.   8. The inferior vena cava is normal  in size with greater than 50%  respiratory variability, suggesting right atrial pressure of 3 mmHg.   7 Day Event Monitor 03/2018: (patient wore the monitor for 6 hours)  Quality: Fair.  Baseline artifact. Predominant rhythm: sinus bradycardia Average heart rate: 59 bpm Max heart rate: 77 bpm Min heart rate: 48 bpm Pauses >2.5 seconds: none  No arrhythmias noted  7 Day Event Monitor 12/21/2019:   Quality: Fair.  Baseline artifact. Predominant rhythm: atrial fibrillation       Average heart rate: 68 bpm Max heart rate: 193 bpm Min heart rate: 43 bpm   Occasional PVCs  Patient was in atrial fibrillation throughout the study.   Recent Labs: 10/26/2022: B Natriuretic Peptide 347.0 04/26/2023: ALT 11; BUN 20; Creat 0.90; Hemoglobin 11.9; Magnesium 1.9; Platelets 192; Potassium 4.8; Sodium 134; TSH 0.94    Lipid Panel    Component Value Date/Time   CHOL 171 04/26/2023 1015   TRIG 149 04/26/2023 1015   HDL 81 04/26/2023 1015   CHOLHDL 2.1 04/26/2023 1015   VLDL 31 (H) 02/05/2017 1149   LDLCALC 67 04/26/2023 1015      Wt Readings from Last 3 Encounters:  04/29/23 115 lb 6.4 oz (52.3 kg)  04/26/23 114 lb 3.2 oz (51.8 kg)  12/19/22 107 lb (48.5 kg)      ASSESSMENT AND PLAN:  1. Persistent atrial fibrillation (HCC)   2. Primary hypertension   3. Hypercholesterolemia   4. Aortic atherosclerosis (HCC) by Abd CT scan (05/2020)         Persistent atrial fibrillation: This is asymptomatic.  She seems to be in longstanding persistent atrial fibrillation there is no indication for cardioversion.  On atenolol for rate control and on Eliquis for stroke/embolism protection.  CHA2DS2-VASc score is at least 7 (age 16, previous embolism 2,  gender, hypertension, PAD - as marked by the presence of aortic atherosclerosis). HTN: Her blood pressure was elevated when she first checked in, but normalized after she rested for a few minutes. HLP: All lipid parameters are in target range,  on statin.  Continue the same medication. Aortic atherosclerosis: Normal caliber aorta, no clinical evidence of CAD or PAD  Patient Instructions  Medication Instructions:  Your physician recommends that you continue on your current medications as  directed. Please refer to the Current Medication list given to you today.  *If you need a refill on your cardiac medications before your next appointment, please call your pharmacy*   Lab Work: None  Testing/Procedures: None   Follow-Up: At [facility], you and your health needs are our priority.  As part of our continuing mission to provide you with exceptional heart care, we have created designated Provider Care Teams.  These Care Teams include your primary Cardiologist (physician) and Advanced Practice Providers (APPs -  Physician Assistants and Nurse Practitioners) who all work together to provide you with the care you need, when you need it.  We recommend signing up for the patient portal called "MyChart".  Sign up information is provided on this After Visit Summary.  MyChart is used to connect with patients for Virtual Visits (Telemedicine).  Patients are able to view lab/test results, encounter notes, upcoming appointments, etc.  Non-urgent messages can be sent to your provider as well.   To learn more about what you can do with MyChart, go to [email or web address].    Your next appointment:   12 month(s)  Provider:   Thurmon Fair, MD    Thurmon Fair, MD, [facility] [phone] office [phone] pager

## 2023-05-16 ENCOUNTER — Other Ambulatory Visit: Payer: Self-pay | Admitting: Nurse Practitioner

## 2023-05-16 DIAGNOSIS — F410 Panic disorder [episodic paroxysmal anxiety] without agoraphobia: Secondary | ICD-10-CM

## 2023-06-14 ENCOUNTER — Other Ambulatory Visit: Payer: Self-pay | Admitting: Nurse Practitioner

## 2023-06-14 DIAGNOSIS — K589 Irritable bowel syndrome without diarrhea: Secondary | ICD-10-CM

## 2023-06-17 ENCOUNTER — Other Ambulatory Visit: Payer: Self-pay | Admitting: Nurse Practitioner

## 2023-06-17 DIAGNOSIS — F410 Panic disorder [episodic paroxysmal anxiety] without agoraphobia: Secondary | ICD-10-CM

## 2023-06-29 DIAGNOSIS — M791 Myalgia, unspecified site: Secondary | ICD-10-CM | POA: Diagnosis not present

## 2023-06-29 DIAGNOSIS — M545 Low back pain, unspecified: Secondary | ICD-10-CM | POA: Diagnosis not present

## 2023-06-29 DIAGNOSIS — M25562 Pain in left knee: Secondary | ICD-10-CM | POA: Diagnosis not present

## 2023-07-03 ENCOUNTER — Encounter: Payer: PPO | Admitting: Nurse Practitioner

## 2023-07-05 ENCOUNTER — Other Ambulatory Visit: Payer: Self-pay | Admitting: Nurse Practitioner

## 2023-07-05 DIAGNOSIS — E782 Mixed hyperlipidemia: Secondary | ICD-10-CM

## 2023-07-05 DIAGNOSIS — N280 Ischemia and infarction of kidney: Secondary | ICD-10-CM

## 2023-07-05 DIAGNOSIS — I700 Atherosclerosis of aorta: Secondary | ICD-10-CM

## 2023-07-16 ENCOUNTER — Other Ambulatory Visit: Payer: Self-pay | Admitting: Nurse Practitioner

## 2023-07-16 DIAGNOSIS — F410 Panic disorder [episodic paroxysmal anxiety] without agoraphobia: Secondary | ICD-10-CM

## 2023-07-23 DIAGNOSIS — M1711 Unilateral primary osteoarthritis, right knee: Secondary | ICD-10-CM | POA: Diagnosis not present

## 2023-07-23 DIAGNOSIS — M25561 Pain in right knee: Secondary | ICD-10-CM | POA: Diagnosis not present

## 2023-07-29 ENCOUNTER — Other Ambulatory Visit: Payer: Self-pay | Admitting: Nurse Practitioner

## 2023-07-29 DIAGNOSIS — G500 Trigeminal neuralgia: Secondary | ICD-10-CM

## 2023-08-06 ENCOUNTER — Encounter: Payer: Self-pay | Admitting: Family Medicine

## 2023-08-06 DIAGNOSIS — M25561 Pain in right knee: Secondary | ICD-10-CM | POA: Diagnosis not present

## 2023-08-06 DIAGNOSIS — M25572 Pain in left ankle and joints of left foot: Secondary | ICD-10-CM | POA: Diagnosis not present

## 2023-08-06 DIAGNOSIS — M79672 Pain in left foot: Secondary | ICD-10-CM | POA: Diagnosis not present

## 2023-08-07 ENCOUNTER — Encounter: Payer: PPO | Admitting: Nurse Practitioner

## 2023-08-08 ENCOUNTER — Telehealth: Payer: Self-pay | Admitting: Cardiovascular Disease

## 2023-08-08 NOTE — Telephone Encounter (Signed)
*  STAT* If patient is at the pharmacy, call can be transferred to refill team.   1. Which medications need to be refilled? (please list name of each medication and dose if known)     apixaban (ELIQUIS) 2.5 MG TABS tablet     4. Which pharmacy/location (including street and city if local pharmacy) is medication to be sent to?[facility] - [place] - [address] [address]    5. Do they need a 30 day or 90 day supply? 90    Pt PCP passed away, she asked if Dr. Royann Shivers can take over refills until she finds another PCP

## 2023-08-09 ENCOUNTER — Other Ambulatory Visit: Payer: Self-pay

## 2023-08-09 DIAGNOSIS — I482 Chronic atrial fibrillation, unspecified: Secondary | ICD-10-CM

## 2023-08-09 MED ORDER — APIXABAN 2.5 MG PO TABS: ORAL_TABLET | ORAL | 3 refills | Status: AC

## 2023-08-09 NOTE — Telephone Encounter (Signed)
Prescription refill request for Eliquis received. Indication:afib Last office visit:11/24 Scr:0.90  11/24 Age: 89 Weight:52.3  kg  Prescription refilled

## 2023-08-13 DIAGNOSIS — M25561 Pain in right knee: Secondary | ICD-10-CM | POA: Diagnosis not present

## 2023-08-20 ENCOUNTER — Other Ambulatory Visit: Payer: Self-pay

## 2023-08-20 DIAGNOSIS — F410 Panic disorder [episodic paroxysmal anxiety] without agoraphobia: Secondary | ICD-10-CM

## 2023-08-20 MED ORDER — CLONAZEPAM 0.5 MG PO TABS: ORAL_TABLET | ORAL | 0 refills | Status: DC

## 2023-08-22 DIAGNOSIS — M1711 Unilateral primary osteoarthritis, right knee: Secondary | ICD-10-CM | POA: Diagnosis not present

## 2023-08-25 ENCOUNTER — Emergency Department (HOSPITAL_COMMUNITY)
Admission: EM | Admit: 2023-08-25 | Discharge: 2023-08-25 | Disposition: A | Discharge status: Home / Self Care | Attending: Emergency Medicine | Admitting: Emergency Medicine

## 2023-08-25 ENCOUNTER — Emergency Department (HOSPITAL_COMMUNITY)

## 2023-08-25 ENCOUNTER — Encounter (HOSPITAL_COMMUNITY): Payer: Self-pay | Admitting: Emergency Medicine

## 2023-08-25 ENCOUNTER — Other Ambulatory Visit: Payer: Self-pay

## 2023-08-25 DIAGNOSIS — Z7901 Long term (current) use of anticoagulants: Secondary | ICD-10-CM | POA: Insufficient documentation

## 2023-08-25 DIAGNOSIS — Z91040 Latex allergy status: Secondary | ICD-10-CM | POA: Insufficient documentation

## 2023-08-25 DIAGNOSIS — I5042 Chronic combined systolic (congestive) and diastolic (congestive) heart failure: Secondary | ICD-10-CM | POA: Diagnosis not present

## 2023-08-25 DIAGNOSIS — I110 Hypertensive heart disease with heart failure: Secondary | ICD-10-CM | POA: Insufficient documentation

## 2023-08-25 DIAGNOSIS — K5730 Diverticulosis of large intestine without perforation or abscess without bleeding: Secondary | ICD-10-CM | POA: Diagnosis not present

## 2023-08-25 DIAGNOSIS — Z79899 Other long term (current) drug therapy: Secondary | ICD-10-CM | POA: Insufficient documentation

## 2023-08-25 DIAGNOSIS — R109 Unspecified abdominal pain: Secondary | ICD-10-CM | POA: Diagnosis not present

## 2023-08-25 DIAGNOSIS — R319 Hematuria, unspecified: Secondary | ICD-10-CM | POA: Insufficient documentation

## 2023-08-25 DIAGNOSIS — R1030 Lower abdominal pain, unspecified: Secondary | ICD-10-CM | POA: Diagnosis not present

## 2023-08-25 LAB — CBC
HCT: 35.4 % — ABNORMAL LOW (ref 36.0–46.0)
Hemoglobin: 11.7 g/dL — ABNORMAL LOW (ref 12.0–15.0)
MCH: 31.7 pg (ref 26.0–34.0)
MCHC: 33.1 g/dL (ref 30.0–36.0)
MCV: 95.9 fL (ref 80.0–100.0)
Platelets: 242 10*3/uL (ref 150–400)
RBC: 3.69 MIL/uL — ABNORMAL LOW (ref 3.87–5.11)
RDW: 12.2 % (ref 11.5–15.5)
WBC: 10.3 10*3/uL (ref 4.0–10.5)
nRBC: 0 % (ref 0.0–0.2)

## 2023-08-25 LAB — COMPREHENSIVE METABOLIC PANEL
ALT: 15 U/L (ref 0–44)
AST: 19 U/L (ref 15–41)
Albumin: 3.8 g/dL (ref 3.5–5.0)
Alkaline Phosphatase: 77 U/L (ref 38–126)
Anion gap: 10 (ref 5–15)
BUN: 13 mg/dL (ref 8–23)
CO2: 25 mmol/L (ref 22–32)
Calcium: 8.9 mg/dL (ref 8.9–10.3)
Chloride: 93 mmol/L — ABNORMAL LOW (ref 98–111)
Creatinine, Ser: 0.69 mg/dL (ref 0.44–1.00)
GFR, Estimated: >60 mL/min (ref >60)
Glucose, Bld: 180 mg/dL — ABNORMAL HIGH (ref 70–99)
Potassium: 3.6 mmol/L (ref 3.5–5.1)
Sodium: 128 mmol/L — ABNORMAL LOW (ref 135–145)
Total Bilirubin: 0.6 mg/dL (ref 0.0–1.2)
Total Protein: 7.1 g/dL (ref 6.5–8.1)

## 2023-08-25 LAB — URINALYSIS, ROUTINE W REFLEX MICROSCOPIC
Bacteria, UA: NONE SEEN
Bilirubin Urine: NEGATIVE
Glucose, UA: NEGATIVE mg/dL
Ketones, ur: NEGATIVE mg/dL
Nitrite: NEGATIVE
Protein, ur: 100 mg/dL — AB
RBC / HPF: >50 RBC/hpf (ref 0–5)
Specific Gravity, Urine: 1.025 (ref 1.005–1.030)
WBC, UA: >50 WBC/hpf (ref 0–5)
pH: 5 (ref 5.0–8.0)

## 2023-08-25 LAB — LIPASE, BLOOD: Lipase: 47 U/L (ref 11–51)

## 2023-08-25 MED ORDER — SODIUM CHLORIDE 0.9 % IV BOLUS
500.0000 mL | Freq: Once | INTRAVENOUS | Status: AC
  Administered 2023-08-25: 500 mL via INTRAVENOUS

## 2023-08-25 NOTE — ED Provider Notes (Addendum)
 [facility] AT [facility] Provider Note   CSN: [id number] Arrival date & time: 08/25/23  1914     History  Chief Complaint  Patient presents with   Hematuria   Abdominal Pain    Debra White is a 89 y.o. female.   Hematuria Associated symptoms include abdominal pain.  Abdominal Pain Associated symptoms: hematuria   Patient presents with lower abdominal pain.  Some hematuria.  Has lower abdominal pain that is sharp.  States some burning with urination.  History of kidney stones.  Is on anticoagulation.  Also had some sort of procedure done by Dr. Annabell Howells from urology years ago but cannot tell me what it was.  No fevers.  Denies blood in the stool.    Past Medical History:  Diagnosis Date   Acute on chronic combined systolic and diastolic CHF (congestive heart failure) (HCC) 06/29/2019   Anxiety    Diverticulosis of colon (without mention of hemorrhage) 2007   Colonoscopy   Emphysema of lung (HCC)    Family history of colon cancer    sister   Fibromyalgia    GERD (gastroesophageal reflux disease)    History of embolic stroke 06/29/2019   Old Rt brain stroke on MRI and history of splenic and renal infarcts in the past     Hyperlipemia    Hypertension    Iron deficiency anemia 05/31/2020   Kidney stones    Ptosis, bilateral    Renal infarct (HCC)    Splenic infarct    Past Surgical History:  Procedure Laterality Date   ABDOMINAL HYSTERECTOMY     BACK SURGERY     X2   BREAST EXCISIONAL BIOPSY     CERVICAL DISC SURGERY     COLONOSCOPY     FACIAL FRACTURE SURGERY     HAND SURGERY     BIL    ORIF TRIPOD FRACTURE  07/13/2011   Procedure: OPEN REDUCTION INTERNAL FIXATION (ORIF) TRIPOD FRACTURE;  Surgeon: [name], MD;  Location: [facility] OR;  Service: ENT;  Laterality: Right;  ORIF RIGHT ZYGOMA, ORBITAL FLOOR EXPLORATION WITH FROST STITCH (TEMPORARY TARSORRHAPHY)   PTOSIS REPAIR Bilateral 11/22/2016   Procedure: INTERNAL PTOSIS  REPAIR;  Surgeon: Floydene Flock, MD;  Location: [facility] OR;  Service: Ophthalmology;  Laterality: Bilateral;   SHOULDER ARTHROSCOPY W/ ROTATOR CUFF REPAIR     LFT     Home Medications Prior to Admission medications   Medication Sig Start Date End Date Taking? Authorizing Provider  acetaminophen (TYLENOL) 500 MG tablet Take 1,000 mg by mouth every 8 (eight) hours as needed for headache.     [provider]  amLODipine (NORVASC) 2.5 MG tablet TAKE 1 TABLET BY MOUTH DAILY 12/24/22   Croitoru, Rachelle Hora, MD  apixaban (ELIQUIS) 2.5 MG TABS tablet Take  1 tablet   2 x /day  for Afib  & to Prevent Blood Clots 08/09/23   Croitoru, Mihai, MD  atenolol (TENORMIN) 100 MG tablet TAKE 1 TABLET BY MOUTH DAILY 01/08/23   Adela Glimpse, NP  clobetasol cream (TEMOVATE) 0.05 % Apply to rash on Right Ear   3 to 4 x /day 06/05/22   Lucky Cowboy, MD  clonazePAM (KLONOPIN) 0.5 MG tablet TAKE 1/2 TABLET BY MOUTH ONCE TO TWICE DAILY AS NEEDED FOR CHRONIC ANXIETY AND PANIC ATTACKS 08/20/23   Worthy Rancher B, FNP  conjugated estrogens (PREMARIN) vaginal cream Place 1 Applicatorful vaginally See admin instructions. Apply one applicatorful vaginally once a week  [provider]  Cyanocobalamin (VITAMIN B 12 PO) Take 1 tablet by mouth daily.    [provider]  diclofenac Sodium (VOLTAREN) 1 % GEL APPLY 2 GRAMS TOPICALLY 3-4 TIMES DAILY AS NEEDED FOR PAIN 05/23/22   [name], PA-C  dicyclomine (BENTYL) 20 MG tablet TAKE 1 TABLET BY MOUTH FOUR TIMES DAILY WITH MEALS AND AT BEDTIME FOR CRAMPING NAUSEA OR BLOATING 06/14/23   Raynelle Dick, NP  escitalopram (LEXAPRO) 10 MG tablet Take 1 tablet Daily for Mood & Chronic Anxiety 11/01/22   Lucky Cowboy, MD  gabapentin (NEURONTIN) 600 MG tablet TAKE 1/2 TO 1 TABLET BY MOUTH 3 TIMES DAILY FOR FACIAL PAIN 07/29/23   Adela Glimpse, NP  lovastatin (MEVACOR) 20 MG tablet TAKE 1 TABLET BY MOUTH EACH NIGHT AT BEDTIME FOR CHOLESTEROL 07/05/23   Raynelle Dick, NP  olmesartan (BENICAR) 40 MG tablet TAKE 1 TABLET BY MOUTH DAILY FOR BLOOD PRESSURE 09/25/22   Lucky Cowboy, MD  omeprazole (PRILOSEC) 40 MG capsule Take                                                                                                                                                                                                             1 capsule   Daily  to Prevent  Heartburn & Indigestion                                                         /                                                                   TAKE                                         BY  MOUTH 02/02/23   Lucky Cowboy, MD  polyvinyl alcohol (LIQUIFILM TEARS) 1.4 % ophthalmic solution Place 1 drop into both eyes 2 (two) times daily as needed for dry eyes.    [provider]  triamcinolone (KENALOG) 0.1 % paste Apply to Lip Ulcers 3 to 4 x/day Patient taking differently: Use as directed 1 Application in the mouth or throat in the morning, at noon, in the evening, and at bedtime. Apply to Lip Ulcers 3 to 4 x/day 06/05/22   Lucky Cowboy, MD  Vaginal Lubricant (REPLENS) GEL Place 1 Applicatorful vaginally daily.    [provider]  Vitamin D-Vitamin K (VITAMIN K2-VITAMIN D3 PO) Take 1 tablet by mouth daily. 90 mcg of K2, 5,000 units of D3    [provider]      Allergies    Latex, Amoxicillin, Aspirin, Barbiturates, Celebrex [celecoxib], Codeine, Evista [raloxifene], Lipitor [atorvastatin], Morphine and codeine, Oruvail [ketoprofen], Pantoprazole, Paraffin, Prochlorperazine, Proloprim [trimethoprim], Venlafaxine, Vibra-tab [doxycycline], Vioxx [rofecoxib], Betadine [povidone iodine], and Caffeine    Review of Systems   Review of Systems  Gastrointestinal:  Positive for abdominal pain.  Genitourinary:  Positive for hematuria.    Physical Exam Updated Vital Signs BP (!) 110/51   Pulse (!) 49   Temp 98.2 F (36.8 C)  (Oral)   Resp 14   Ht 5\' 4"  (1.626 m)   Wt 53.5 kg   SpO2 95%   BMI 20.25 kg/m  Physical Exam Vitals and nursing note reviewed.  Pulmonary:     Breath sounds: No wheezing or rhonchi.  Abdominal:     Tenderness: There is abdominal tenderness.     Comments: Mild suprapubic tenderness without rebound or guarding.  No hernia palpated.  Skin:    General: Skin is warm.  Neurological:     Mental Status: She is alert.     ED Results / Procedures / Treatments   Labs (all labs ordered are listed, but only abnormal results are displayed) Labs Reviewed  COMPREHENSIVE METABOLIC PANEL - Abnormal; Notable for the following components:      Result Value   Sodium 128 (*)    Chloride 93 (*)    Glucose, Bld 180 (*)    All other components within normal limits  CBC - Abnormal; Notable for the following components:   RBC 3.69 (*)    Hemoglobin 11.7 (*)    HCT 35.4 (*)    All other components within normal limits  URINALYSIS, ROUTINE W REFLEX MICROSCOPIC - Abnormal; Notable for the following components:   APPearance TURBID (*)    Hgb urine dipstick LARGE (*)    Protein, ur 100 (*)    Leukocytes,Ua MODERATE (*)    Non Squamous Epithelial 6-10 (*)    All other components within normal limits  URINE CULTURE  LIPASE, BLOOD    EKG None  Radiology CT Renal Stone Study Result Date: 08/25/2023 CLINICAL DATA:  Abdominal/flank pain with stone suspected EXAM: CT ABDOMEN AND PELVIS WITHOUT CONTRAST TECHNIQUE: Multidetector CT imaging of the abdomen and pelvis was performed following the standard protocol without IV contrast. RADIATION DOSE REDUCTION: This exam was performed according to the departmental dose-optimization program which includes automated exposure control, adjustment of the mA and/or kV according to patient size and/or use of iterative reconstruction technique. COMPARISON:  05/27/2020 FINDINGS: Lower chest: Opacity with volume loss at the right lung base, likely atelectasis.  Atherosclerosis including the coronary arteries. Cardiomegaly. Elevated right diaphragm. Hepatobiliary: No focal liver abnormality.No evidence of biliary obstruction or  stone. Pancreas: Unremarkable. Spleen: Unremarkable. Adrenals/Urinary Tract: Negative adrenals. No hydronephrosis or stone. Unremarkable bladder. Stomach/Bowel: No bowel obstruction or visible inflammation. Numerous colonic diverticula. Vascular/Lymphatic: Extensive atheromatous calcification of the aorta and iliacs. No mass or adenopathy. Reproductive:Hysterectomy. Other: No ascites or pneumoperitoneum. Musculoskeletal: Generalized lumbar spine degeneration with hyperlordosis. Lower lumbar fusion with solid arthrodesis. Lower thoracic ankylosis. IMPRESSION: 1. No acute finding.  No hydronephrosis or nephrolithiasis. 2. Atherosclerosis and colonic diverticulosis. Electronically Signed   By: Tiburcio Pea M.D.   On: 08/25/2023 10:23    Procedures Procedures    Medications Ordered in ED Medications - No data to display  ED Course/ Medical Decision Making/ A&P                                 Medical Decision Making Amount and/or Complexity of Data Reviewed Labs: ordered. Radiology: ordered.   Patient with dysuria and lower abdominal pain.  Differential diagnose includes UTI, kidney stone, mass.  Will get basic blood work.  Does show mild anemia.  Also mild hyponatremia.  Urinalysis pending.  Urinalysis shows hematuria without clear infection.  Does have mild hyponatremia.  Can be followed as an outpatient.  CT scan done of the abdomen pelvis and reassuring.  Appears stable for discharge home to follow-up with PCP and urology.  Will give small fluid bolus for the hyponatremia but I think to be managed as an outpatient.          Final Clinical Impression(s) / ED Diagnoses Final diagnoses:  Hematuria, unspecified type    Rx / DC Orders ED Discharge Orders     None         Benjiman Core, MD 08/25/23  1131    Benjiman Core, MD 08/25/23 [phone]

## 2023-08-25 NOTE — Discharge Instructions (Addendum)
 Your sodium was a little low and the urine did show blood without clear infection.  Your CT scan is reassuring.  Follow-up with your primary care doctor and urology.

## 2023-08-25 NOTE — ED Triage Notes (Signed)
 Pt presents to the ED via POV with complaints of lower abdominal pain and an incident of hematuria this AM. Hx of kidney stones and describes the pain as sharp in nature. A&Ox4 at this time. Denies CP or SOB.

## 2023-08-26 DIAGNOSIS — R310 Gross hematuria: Secondary | ICD-10-CM | POA: Diagnosis not present

## 2023-08-26 DIAGNOSIS — R300 Dysuria: Secondary | ICD-10-CM | POA: Diagnosis not present

## 2023-08-26 DIAGNOSIS — N952 Postmenopausal atrophic vaginitis: Secondary | ICD-10-CM | POA: Diagnosis not present

## 2023-08-27 LAB — URINE CULTURE: Culture: >=100000 — AB

## 2023-08-28 ENCOUNTER — Telehealth (HOSPITAL_BASED_OUTPATIENT_CLINIC_OR_DEPARTMENT_OTHER): Payer: Self-pay

## 2023-08-28 NOTE — Progress Notes (Signed)
 ED Antimicrobial Stewardship Positive Culture Follow Up   Debra White is an 89 y.o. female who presented to [facility] on 08/25/2023 with a chief complaint of  Chief Complaint  Patient presents with   Hematuria   Abdominal Pain    Recent Results (from the past 720 hours)  Urine Culture     Status: Abnormal   Collection Time: 08/25/23 11:31 AM   Specimen: Urine, Clean Catch  Result Value Ref Range Status   Specimen Description   Final    URINE, CLEAN CATCH Performed at [facility], [address], [place], [place] [address]    Special Requests   Final    NONE Performed at [facility], [address], [place], [place] [address]    Culture >=100,000 COLONIES/mL KLEBSIELLA PNEUMONIAE (A)  Final   Report Status 08/27/2023 FINAL  Final   Organism ID, Bacteria KLEBSIELLA PNEUMONIAE (A)  Final      Susceptibility   Klebsiella pneumoniae - MIC*    AMPICILLIN >=32 RESISTANT Resistant     CEFAZOLIN <=4 SENSITIVE Sensitive     CEFEPIME <=0.12 SENSITIVE Sensitive     CEFTRIAXONE <=0.25 SENSITIVE Sensitive     CIPROFLOXACIN <=0.25 SENSITIVE Sensitive     GENTAMICIN <=1 SENSITIVE Sensitive     IMIPENEM <=0.25 SENSITIVE Sensitive     NITROFURANTOIN 64 INTERMEDIATE Intermediate     TRIMETH/SULFA <=20 SENSITIVE Sensitive     AMPICILLIN/SULBACTAM 4 SENSITIVE Sensitive     PIP/TAZO <=4 SENSITIVE Sensitive ug/mL    * >=100,000 COLONIES/mL KLEBSIELLA PNEUMONIAE    [x]  Patient discharged originally without antimicrobial agent and treatment is now indicated  New antibiotic prescription: Cephalexin 500 mg po twice daily for 7 days  ED Provider: Glyn Ade, MD  Thank you for allowing pharmacy to be a part of this patient's care.  Georgina Pillion, PharmD, BCPS, BCIDP Infectious Diseases Clinical Pharmacist 08/28/2023 8:27 AM   **Pharmacist phone directory can now be found on [email or web address] ([email or web address]).  Listed under [facility].

## 2023-08-28 NOTE — Telephone Encounter (Signed)
 Post ED Visit - Positive Culture Follow-up: Successful Patient Follow-Up  Culture assessed and recommendations reviewed by:  []  Enzo Bi, Pharm.D. []  Celedonio Miyamoto, Pharm.D., BCPS AQ-ID []  Garvin Fila, Pharm.D., BCPS [x]  Georgina Pillion, [address].D., BCPS []  [place], [address].D., BCPS, AAHIVP []  Estella Husk, Pharm.D., BCPS, AAHIVP []  Lysle Pearl, PharmD, BCPS []  Phillips Climes, PharmD, BCPS []  Agapito Games, PharmD, BCPS []  Verlan Friends, PharmD  Positive urine culture  [x]  Patient discharged without antimicrobial prescription and treatment is now indicated []  Organism is resistant to prescribed ED discharge antimicrobial []  Patient with positive blood cultures  Changes discussed with ED provider: Glyn Ade, MD New antibiotic prescription Keflex 500 mg po BID x 7 days Called to [facility]  Contacted patient, date 08/28/23, time 11:20 am   Sandria Senter 08/28/2023, 11:29 AM

## 2023-09-01 ENCOUNTER — Encounter: Payer: Self-pay | Admitting: Family Medicine

## 2023-09-01 NOTE — Progress Notes (Unsigned)
 Tiffini Blacksher T. Zeva Leber, MD, CAQ Sports Medicine [facility] [address] [place], [address]  Phone: [phone]  FAX: [phone]  Debra White - 89 y.o. female  MRN [id number]  Date of Birth: 18-Jul-1933  Date: 09/02/2023  PCP: Patient, No Pcp Per  Referral: Lucky Cowboy, MD  No chief complaint on file.  Subjective:   Debra White is a 89 y.o. very pleasant female patient with There is no height or weight on file to calculate BMI. who presents with the following:  The patient is a 89 year old patient of Dr. Oneta Rack who is a new patient to our medical practice and a new patient to me today.  She has multiple medical problems.  Does have a history of chronic A-fib.  She is on Eliquis, and additionally for cardiac measures and hypertension she is on Benicar 40 mg, Norvasc 2.5 mg, atenolol 100 mg.  She has a history of prior embolic stroke  She also has COPD, but does not look as if she is on any regular scheduled inhalers.  For cholesterol, she is on lovastatin 20 mg a day.  She is also is on Lexapro 10 mg a day along with Klonopin 0.25 mg p.o. twice daily.  It looks like she has had severe benzodiazepine withdrawal in the past including instigating A-fib with RVR requiring emergency room visit.  She was seen on August 25, 2023 for evaluation in the emergency room for hematuria.    Review of Systems is noted in the HPI, as appropriate  Patient Active Problem List   Diagnosis Date Noted   Chronic atrial fibrillation with RVR (HCC) 10/26/2022    Priority: High   Chronic diastolic heart failure (HCC) 06/29/2019    Priority: High   History of embolic stroke 06/29/2019    Priority: High   CAD (coronary artery disease), native coronary artery 03/26/2018    Priority: High   GAD (generalized anxiety disorder) 11/19/2019    Priority: Medium    Aortic atherosclerosis (HCC) by Abd CT scan (05/2020)  08/08/2017    Priority: Medium     COPD (chronic obstructive pulmonary disease) with emphysema (HCC) 12/21/2015    Priority: Medium    Essential hypertension     Priority: Medium    Mixed hyperlipidemia     Priority: Medium    B12 deficiency 10/20/2021    Priority: Low   Benzodiazepine withdrawal without complication (HCC) 10/26/2022   Thrombophilia (HCC) 10/20/2020   Renal infarct (HCC) 03/22/2018   Hepatic steatosis 11/17/2017   Osteoporosis 12/21/2015   Vitamin D deficiency 07/08/2013   GERD    Headache, post-traumatic, chronic 09/20/2011    Past Medical History:  Diagnosis Date   Acute on chronic combined systolic and diastolic CHF (congestive heart failure) (HCC) 06/29/2019   Diverticulosis of colon (without mention of hemorrhage) 2007   Colonoscopy   Emphysema of lung (HCC)    Family history of colon cancer    sister   Fibromyalgia    GAD (generalized anxiety disorder)    GERD (gastroesophageal reflux disease)    History of embolic stroke 06/29/2019   Old Rt brain stroke on MRI and history of splenic and renal infarcts in the past     Hyperlipemia    Hypertension    Iron deficiency anemia 05/31/2020   Kidney stones    Renal infarct [facility])    Splenic infarct     Past Surgical History:  Procedure Laterality Date   ABDOMINAL HYSTERECTOMY  BACK SURGERY     X2   BREAST EXCISIONAL BIOPSY     CERVICAL DISC SURGERY     COLONOSCOPY     FACIAL FRACTURE SURGERY     HAND SURGERY     BIL    ORIF TRIPOD FRACTURE  07/13/2011   Procedure: OPEN REDUCTION INTERNAL FIXATION (ORIF) TRIPOD FRACTURE;  Surgeon: [name], MD;  Location: [facility] OR;  Service: ENT;  Laterality: Right;  ORIF RIGHT ZYGOMA, ORBITAL FLOOR EXPLORATION WITH FROST STITCH (TEMPORARY TARSORRHAPHY)   PTOSIS REPAIR Bilateral 11/22/2016   Procedure: INTERNAL PTOSIS REPAIR;  Surgeon: Floydene Flock, MD;  Location: [facility] OR;  Service: Ophthalmology;  Laterality: Bilateral;   SHOULDER ARTHROSCOPY W/ ROTATOR CUFF REPAIR     LFT    Family  History  Problem Relation Age of Onset   Heart disease Mother    Heart disease Father    Heart attack Father    Breast cancer Sister 35   Colon cancer Sister    Cancer Sister        breast   Heart disease Sister    Cancer Sister        colon   Cancer Sister        colon   Hypertension Daughter    Hyperlipidemia Daughter     Social History   Social History Narrative   [place].   Right-handed.   No caffeine use.     Objective:   There were no vitals taken for this visit.  GEN: No acute distress; alert,appropriate. PULM: Breathing comfortably in no respiratory distress PSYCH: Normally interactive.   Laboratory and Imaging Data:  Assessment and Plan:   ***

## 2023-09-02 ENCOUNTER — Ambulatory Visit (INDEPENDENT_AMBULATORY_CARE_PROVIDER_SITE_OTHER): Payer: PPO | Admitting: Family Medicine

## 2023-09-02 ENCOUNTER — Encounter: Payer: Self-pay | Admitting: Family Medicine

## 2023-09-02 VITALS — BP 132/64 | HR 52 | Temp 98.4°F | Ht 62.75 in | Wt 119.0 lb

## 2023-09-02 DIAGNOSIS — K2100 Gastro-esophageal reflux disease with esophagitis, without bleeding: Secondary | ICD-10-CM | POA: Diagnosis not present

## 2023-09-02 DIAGNOSIS — Z8673 Personal history of transient ischemic attack (TIA), and cerebral infarction without residual deficits: Secondary | ICD-10-CM

## 2023-09-02 DIAGNOSIS — I2510 Atherosclerotic heart disease of native coronary artery without angina pectoris: Secondary | ICD-10-CM | POA: Diagnosis not present

## 2023-09-02 DIAGNOSIS — F411 Generalized anxiety disorder: Secondary | ICD-10-CM | POA: Diagnosis not present

## 2023-09-02 DIAGNOSIS — F410 Panic disorder [episodic paroxysmal anxiety] without agoraphobia: Secondary | ICD-10-CM

## 2023-09-02 DIAGNOSIS — J431 Panlobular emphysema: Secondary | ICD-10-CM

## 2023-09-02 DIAGNOSIS — I5032 Chronic diastolic (congestive) heart failure: Secondary | ICD-10-CM | POA: Diagnosis not present

## 2023-09-02 DIAGNOSIS — K219 Gastro-esophageal reflux disease without esophagitis: Secondary | ICD-10-CM

## 2023-09-02 DIAGNOSIS — I10 Essential (primary) hypertension: Secondary | ICD-10-CM

## 2023-09-02 DIAGNOSIS — E782 Mixed hyperlipidemia: Secondary | ICD-10-CM | POA: Diagnosis not present

## 2023-09-02 DIAGNOSIS — D735 Infarction of spleen: Secondary | ICD-10-CM | POA: Diagnosis not present

## 2023-09-02 DIAGNOSIS — I482 Chronic atrial fibrillation, unspecified: Secondary | ICD-10-CM

## 2023-09-02 MED ORDER — ESCITALOPRAM OXALATE 10 MG PO TABS: ORAL_TABLET | ORAL | 3 refills | Status: AC

## 2023-09-02 MED ORDER — ATENOLOL 100 MG PO TABS: ORAL_TABLET | ORAL | 3 refills | Status: AC

## 2023-09-02 MED ORDER — AMLODIPINE BESYLATE 2.5 MG PO TABS: 2.5000 mg | ORAL_TABLET | Freq: Every day | ORAL | 3 refills | Status: AC

## 2023-09-02 MED ORDER — OMEPRAZOLE 40 MG PO CPDR: DELAYED_RELEASE_CAPSULE | ORAL | 3 refills | Status: AC

## 2023-09-03 ENCOUNTER — Encounter: Payer: Self-pay | Admitting: Family Medicine

## 2023-09-03 DIAGNOSIS — D735 Infarction of spleen: Secondary | ICD-10-CM | POA: Insufficient documentation

## 2023-09-04 ENCOUNTER — Ambulatory Visit: Payer: PPO | Admitting: Family Medicine

## 2023-09-13 ENCOUNTER — Other Ambulatory Visit: Payer: Self-pay | Admitting: Family Medicine

## 2023-09-13 ENCOUNTER — Other Ambulatory Visit: Payer: Self-pay

## 2023-09-13 DIAGNOSIS — Z1231 Encounter for screening mammogram for malignant neoplasm of breast: Secondary | ICD-10-CM

## 2023-09-13 MED ORDER — OLMESARTAN MEDOXOMIL 40 MG PO TABS: 40.0000 mg | ORAL_TABLET | Freq: Every day | ORAL | 0 refills | Status: DC

## 2023-09-13 MED ORDER — OLMESARTAN MEDOXOMIL 40 MG PO TABS: 40.0000 mg | ORAL_TABLET | Freq: Every day | ORAL | 1 refills | Status: DC

## 2023-09-19 ENCOUNTER — Other Ambulatory Visit: Payer: Self-pay | Admitting: Family

## 2023-09-19 DIAGNOSIS — F410 Panic disorder [episodic paroxysmal anxiety] without agoraphobia: Secondary | ICD-10-CM

## 2023-09-23 ENCOUNTER — Other Ambulatory Visit: Payer: Self-pay | Admitting: Family Medicine

## 2023-09-23 DIAGNOSIS — N3001 Acute cystitis with hematuria: Secondary | ICD-10-CM | POA: Diagnosis not present

## 2023-09-23 DIAGNOSIS — F410 Panic disorder [episodic paroxysmal anxiety] without agoraphobia: Secondary | ICD-10-CM

## 2023-09-23 DIAGNOSIS — R300 Dysuria: Secondary | ICD-10-CM | POA: Diagnosis not present

## 2023-09-23 DIAGNOSIS — N952 Postmenopausal atrophic vaginitis: Secondary | ICD-10-CM | POA: Diagnosis not present

## 2023-09-23 DIAGNOSIS — R351 Nocturia: Secondary | ICD-10-CM | POA: Diagnosis not present

## 2023-09-23 NOTE — Telephone Encounter (Unsigned)
 Copied from CRM [id number]. Topic: Clinical - Medication Refill >> Sep 23, 2023  1:13 PM Sim Boast F wrote: Most Recent Primary Care Visit:  Provider: Hannah Beat  Department: [facility]  Visit Type: NEW PT - OFFICE VISIT  Date: 09/02/2023  Medication: clonazePAM  Has the patient contacted their pharmacy? No, pharmacy called   (Agent: If no, request that the patient contact the pharmacy for the refill. If patient does not wish to contact the pharmacy document the reason why and proceed with request.) (Agent: If yes, when and what did the pharmacy advise?)  Is this the correct pharmacy for this prescription? Yes If no, delete pharmacy and type the correct one.  This is the patient's preferred pharmacy:  [facility] - [place], [place] - [address] [place] [address] Phone: [phone] Fax: [phone]   Has the prescription been filled recently? Yes  Is the patient out of the medication? Yes  Has the patient been seen for an appointment in the last year OR does the patient have an upcoming appointment? Yes  Can we respond through MyChart? No  Agent: Please be advised that Rx refills may take up to 3 business days. We ask that you follow-up with your pharmacy.

## 2023-09-24 MED ORDER — CLONAZEPAM 0.5 MG PO TABS: ORAL_TABLET | ORAL | 1 refills | Status: DC

## 2023-09-24 NOTE — Telephone Encounter (Signed)
 Last office visit 09/02/2023 for establish care.  Last refilled 08/20/2023 for #20 with no refills.  Next appt: 03/02/24 for 6 month follow up.

## 2023-09-28 ENCOUNTER — Emergency Department (HOSPITAL_COMMUNITY)

## 2023-09-28 ENCOUNTER — Emergency Department (HOSPITAL_COMMUNITY)
Admission: EM | Admit: 2023-09-28 | Discharge: 2023-09-28 | Disposition: A | Discharge status: Home / Self Care | Attending: Emergency Medicine | Admitting: Emergency Medicine

## 2023-09-28 DIAGNOSIS — I10 Essential (primary) hypertension: Secondary | ICD-10-CM | POA: Diagnosis not present

## 2023-09-28 DIAGNOSIS — Z91040 Latex allergy status: Secondary | ICD-10-CM | POA: Insufficient documentation

## 2023-09-28 DIAGNOSIS — R109 Unspecified abdominal pain: Secondary | ICD-10-CM | POA: Diagnosis not present

## 2023-09-28 DIAGNOSIS — M549 Dorsalgia, unspecified: Secondary | ICD-10-CM | POA: Diagnosis not present

## 2023-09-28 DIAGNOSIS — Z7901 Long term (current) use of anticoagulants: Secondary | ICD-10-CM | POA: Insufficient documentation

## 2023-09-28 DIAGNOSIS — R1030 Lower abdominal pain, unspecified: Secondary | ICD-10-CM | POA: Diagnosis present

## 2023-09-28 DIAGNOSIS — I509 Heart failure, unspecified: Secondary | ICD-10-CM | POA: Insufficient documentation

## 2023-09-28 DIAGNOSIS — N3000 Acute cystitis without hematuria: Secondary | ICD-10-CM | POA: Diagnosis not present

## 2023-09-28 DIAGNOSIS — K5730 Diverticulosis of large intestine without perforation or abscess without bleeding: Secondary | ICD-10-CM | POA: Diagnosis not present

## 2023-09-28 DIAGNOSIS — N200 Calculus of kidney: Secondary | ICD-10-CM | POA: Diagnosis not present

## 2023-09-28 LAB — COMPREHENSIVE METABOLIC PANEL WITH GFR
ALT: 18 U/L (ref 0–44)
AST: 21 U/L (ref 15–41)
Albumin: 4.1 g/dL (ref 3.5–5.0)
Alkaline Phosphatase: 90 U/L (ref 38–126)
Anion gap: 8 (ref 5–15)
BUN: 16 mg/dL (ref 8–23)
CO2: 26 mmol/L (ref 22–32)
Calcium: 8.7 mg/dL — ABNORMAL LOW (ref 8.9–10.3)
Chloride: 92 mmol/L — ABNORMAL LOW (ref 98–111)
Creatinine, Ser: 0.72 mg/dL (ref 0.44–1.00)
GFR, Estimated: >60 mL/min (ref >60)
Glucose, Bld: 91 mg/dL (ref 70–99)
Potassium: 4.1 mmol/L (ref 3.5–5.1)
Sodium: 126 mmol/L — ABNORMAL LOW (ref 135–145)
Total Bilirubin: 0.6 mg/dL (ref 0.0–1.2)
Total Protein: 7.5 g/dL (ref 6.5–8.1)

## 2023-09-28 LAB — URINALYSIS, ROUTINE W REFLEX MICROSCOPIC
Bilirubin Urine: NEGATIVE
Glucose, UA: NEGATIVE mg/dL
Ketones, ur: NEGATIVE mg/dL
Nitrite: NEGATIVE
Protein, ur: NEGATIVE mg/dL
Specific Gravity, Urine: 1.015 (ref 1.005–1.030)
WBC, UA: >50 WBC/hpf (ref 0–5)
pH: 7 (ref 5.0–8.0)

## 2023-09-28 LAB — CBC WITH DIFFERENTIAL/PLATELET
Abs Immature Granulocytes: 0.04 10*3/uL (ref 0.00–0.07)
Basophils Absolute: 0.1 10*3/uL (ref 0.0–0.1)
Basophils Relative: 1 %
Eosinophils Absolute: 0.2 10*3/uL (ref 0.0–0.5)
Eosinophils Relative: 2 %
HCT: 32.9 % — ABNORMAL LOW (ref 36.0–46.0)
Hemoglobin: 10.8 g/dL — ABNORMAL LOW (ref 12.0–15.0)
Immature Granulocytes: 0 %
Lymphocytes Relative: 12 %
Lymphs Abs: 1.2 10*3/uL (ref 0.7–4.0)
MCH: 31.7 pg (ref 26.0–34.0)
MCHC: 32.8 g/dL (ref 30.0–36.0)
MCV: 96.5 fL (ref 80.0–100.0)
Monocytes Absolute: 1.1 10*3/uL — ABNORMAL HIGH (ref 0.1–1.0)
Monocytes Relative: 10 %
Neutro Abs: 8 10*3/uL — ABNORMAL HIGH (ref 1.7–7.7)
Neutrophils Relative %: 75 %
Platelets: 183 10*3/uL (ref 150–400)
RBC: 3.41 MIL/uL — ABNORMAL LOW (ref 3.87–5.11)
RDW: 12.6 % (ref 11.5–15.5)
WBC: 10.6 10*3/uL — ABNORMAL HIGH (ref 4.0–10.5)
nRBC: 0 % (ref 0.0–0.2)

## 2023-09-28 MED ORDER — IOHEXOL 300 MG/ML  SOLN
80.0000 mL | Freq: Once | INTRAMUSCULAR | Status: AC | PRN
  Administered 2023-09-28: 80 mL via INTRAVENOUS

## 2023-09-28 MED ORDER — SODIUM CHLORIDE 0.9 % IV BOLUS
500.0000 mL | Freq: Once | INTRAVENOUS | Status: AC
Start: 2023-09-28 — End: 2023-09-28
  Administered 2023-09-28: 500 mL via INTRAVENOUS

## 2023-09-28 MED ORDER — ACETAMINOPHEN 500 MG PO TABS
1000.0000 mg | ORAL_TABLET | Freq: Once | ORAL | Status: AC
  Administered 2023-09-28: 1000 mg via ORAL
  Filled 2023-09-28: qty 2

## 2023-09-28 MED ORDER — SODIUM CHLORIDE 0.9 % IV SOLN
1.0000 g | Freq: Once | INTRAVENOUS | Status: AC
  Administered 2023-09-28: 1 g via INTRAVENOUS
  Filled 2023-09-28: qty 10

## 2023-09-28 MED ORDER — CEFPODOXIME PROXETIL 200 MG PO TABS
200.0000 mg | ORAL_TABLET | Freq: Two times a day (BID) | ORAL | 0 refills | Status: AC
Start: 2023-09-28 — End: 2023-10-05

## 2023-09-28 NOTE — ED Provider Notes (Signed)
 [place] EMERGENCY DEPARTMENT AT [facility] Provider Note   CSN: [id number] Arrival date & time: 09/28/23  1546     History  Chief Complaint  Patient presents with   Flank Pain   Back Pain    Debra White is a 89 y.o. female, hx of kidney stones, CHF, who presents to the ED 2/2 to suprapubic pain, with hematuria, dysuria, and intermittent spasms that have been going on for the last 2 to 3 days.  She states last night it got so bad she decided to come to the ED.  Reports that the pain similar to the last 2 to 3 days, sharp, achy at the same time, comes and goes.  States she is having some urinary pain, and blood in her urine.  Denies any vaginal discharge, nausea, vomiting, fevers or chills.  States she has had kidney stones before, does not know if that is it, or she has urinary tract infection.  Is not incontinent.  Pain radiates to the back, sometimes, but overall has no radiation to the upper abdomen     Home Medications Prior to Admission medications   Medication Sig Start Date End Date Taking? Authorizing Provider  cefpodoxime (VANTIN) 200 MG tablet Take 1 tablet (200 mg total) by mouth 2 (two) times daily for 7 days. 09/28/23 10/05/23 Yes Naly Schwanz L, PA  acetaminophen (TYLENOL) 500 MG tablet Take 1,000 mg by mouth every 8 (eight) hours as needed for headache.     [provider]  amLODipine (NORVASC) 2.5 MG tablet Take 1 tablet (2.5 mg total) by mouth daily. 09/02/23   Copland, Karleen Hampshire, MD  apixaban (ELIQUIS) 2.5 MG TABS tablet Take  1 tablet   2 x /day  for Afib  & to Prevent Blood Clots 08/09/23   Croitoru, Mihai, MD  atenolol (TENORMIN) 100 MG tablet TAKE 1 TABLET BY MOUTH DAILY 09/02/23   Copland, Karleen Hampshire, MD  cephALEXin (KEFLEX) 500 MG capsule Take 500 mg by mouth 2 (two) times daily. 08/28/23   [provider]  clonazePAM (KLONOPIN) 0.5 MG tablet TAKE 1/2 TABLET BY MOUTH ONCE TO TWICE DAILY AS NEEDED FOR CHRONIC ANXIETY AND PANIC ATTACKS 09/24/23    Copland, Karleen Hampshire, MD  conjugated estrogens (PREMARIN) vaginal cream Place 1 Applicatorful vaginally See admin instructions. Apply one applicatorful vaginally once a week    [provider]  Cyanocobalamin (VITAMIN B 12 PO) Take 1 tablet by mouth daily.    [provider]  escitalopram (LEXAPRO) 10 MG tablet Take 1 tablet Daily for Mood & Chronic Anxiety 09/02/23   Copland, Karleen Hampshire, MD  estradiol (ESTRACE) 0.1 MG/GM vaginal cream Place 1 Applicatorful vaginally 2 (two) times a week. 08/26/23   [provider]  gabapentin (NEURONTIN) 600 MG tablet TAKE 1/2 TO 1 TABLET BY MOUTH 3 TIMES DAILY FOR FACIAL PAIN 07/29/23   Adela Glimpse, NP  lovastatin (MEVACOR) 20 MG tablet TAKE 1 TABLET BY MOUTH EACH NIGHT AT BEDTIME FOR CHOLESTEROL 07/05/23   Raynelle Dick, NP  mirtazapine (REMERON) 15 MG tablet Take 7.5 mg by mouth at bedtime. 06/04/23   [provider]  olmesartan (BENICAR) 40 MG tablet Take 1 tablet (40 mg total) by mouth daily. 09/13/23   Eulis Foster, FNP  omeprazole (PRILOSEC) 40 MG capsule Take  1 capsule   Daily  to Prevent  Heartburn & Indigestion                                                         /                                                                   TAKE                                         BY                                                 MOUTH 09/02/23   Copland, Karleen Hampshire, MD  polyvinyl alcohol (LIQUIFILM TEARS) 1.4 % ophthalmic solution Place 1 drop into both eyes 2 (two) times daily as needed for dry eyes.    [provider]  Trolamine Salicylate (BLUE-EMU MAXIMUM PAIN RELIEF EX) Apply topically.    [provider]  Vitamin D-Vitamin K (VITAMIN K2-VITAMIN D3 PO) Take 1 tablet by mouth daily. 90 mcg of K2, 5,000 units of  D3    [provider]  Zinc 50 MG TABS Take by mouth.    [provider]      Allergies    Latex, Amoxicillin, Aspirin, Barbiturates, Celebrex [celecoxib], Codeine, Evista [raloxifene], Lipitor [atorvastatin], Morphine and codeine, Oruvail [ketoprofen], Pantoprazole, Paraffin, Prochlorperazine, Proloprim [trimethoprim], Venlafaxine, Vibra-tab [doxycycline], Vioxx [rofecoxib], Betadine [povidone iodine], and Caffeine    Review of Systems   Review of Systems  Constitutional:  Negative for fever.  Genitourinary:  Positive for flank pain.  Musculoskeletal:  Positive for back pain.    Physical Exam Updated Vital Signs BP (!) 157/140   Pulse 73   Temp 98.7 F (37.1 C) (Oral)   Resp (!) 23   SpO2 98%  Physical Exam Vitals and nursing note reviewed.  Constitutional:      General: She is not in acute distress.    Appearance: She is well-developed.  HENT:     Head: Normocephalic and atraumatic.  Eyes:     Conjunctiva/sclera: Conjunctivae normal.  Cardiovascular:     Rate and Rhythm: Normal rate and regular rhythm.     Heart sounds: No murmur heard. Pulmonary:     Effort: Pulmonary effort is normal. No respiratory distress.     Breath sounds: Normal breath sounds.  Abdominal:     Palpations: Abdomen is soft.     Tenderness: There is abdominal tenderness in the suprapubic area.  Musculoskeletal:        General: No swelling.     Cervical back: Neck supple.  Skin:    General: Skin is warm and dry.     Capillary Refill: Capillary refill takes less than 2 seconds.  Neurological:     Mental Status: She is alert.  Psychiatric:  Mood and Affect: Mood normal.     ED Results / Procedures / Treatments   Labs (all labs ordered are listed, but only abnormal results are displayed) Labs Reviewed  CBC WITH DIFFERENTIAL/PLATELET - Abnormal; Notable for the following components:      Result Value   WBC 10.6 (*)    RBC 3.41 (*)    Hemoglobin 10.8 (*)    HCT  32.9 (*)    Neutro Abs 8.0 (*)    Monocytes Absolute 1.1 (*)    All other components within normal limits  COMPREHENSIVE METABOLIC PANEL WITH GFR - Abnormal; Notable for the following components:   Sodium 126 (*)    Chloride 92 (*)    Calcium 8.7 (*)    All other components within normal limits  URINALYSIS, ROUTINE W REFLEX MICROSCOPIC - Abnormal; Notable for the following components:   Color, Urine STRAW (*)    Hgb urine dipstick MODERATE (*)    Leukocytes,Ua MODERATE (*)    Bacteria, UA RARE (*)    All other components within normal limits    EKG None  Radiology CT ABDOMEN PELVIS W CONTRAST Result Date: 09/28/2023 CLINICAL DATA:  Abdominal/flank pain. EXAM: CT ABDOMEN AND PELVIS WITH CONTRAST TECHNIQUE: Multidetector CT imaging of the abdomen and pelvis was performed using the standard protocol following bolus administration of intravenous contrast. RADIATION DOSE REDUCTION: This exam was performed according to the departmental dose-optimization program which includes automated exposure control, adjustment of the mA and/or kV according to patient size and/or use of iterative reconstruction technique. CONTRAST:  80mL OMNIPAQUE IOHEXOL 300 MG/ML  SOLN COMPARISON:  CT abdomen pelvis dated 08/25/2023. FINDINGS: Lower chest: Minimal bibasilar atelectasis. There is cardiomegaly. Coronary vascular calcification. No intra-abdominal free air or free fluid. Hepatobiliary: Slight irregularity of the liver contour may represent early changes of cirrhosis, possibly secondary to passive congestion. Clinical correlation is recommended. Mild biliary dilatation versus periportal edema. The gallbladder is unremarkable. Pancreas: Unremarkable. No pancreatic ductal dilatation or surrounding inflammatory changes. Spleen: Normal in size without focal abnormality. Adrenals/Urinary Tract: The adrenal glands are unremarkable. There is no hydronephrosis on either side. There is symmetric enhancement and excretion of  contrast by both kidneys. The visualized ureters and urinary bladder appear unremarkable. Stomach/Bowel: There is sigmoid diverticulosis with muscular hypertrophy. No active inflammatory changes. There is no bowel obstruction or active inflammation. The appendix is not visualized with certainty. No inflammatory changes identified in the right lower quadrant. Vascular/Lymphatic: Moderate aortoiliac atherosclerotic disease. The IVC is unremarkable. No portal venous gas. There is no adenopathy. Reproductive: Hysterectomy.  No suspicious adnexal masses. Other: None Musculoskeletal: Osteopenia with degenerative changes of spine. No acute osseous pathology. IMPRESSION: 1. No acute intra-abdominal or pelvic pathology. 2. Sigmoid diverticulosis. No bowel obstruction. 3. Cardiomegaly. Electronically Signed   By: Elgie Collard M.D.   On: 09/28/2023 17:44    Procedures Procedures    Medications Ordered in ED Medications  sodium chloride 0.9 % bolus 500 mL (500 mLs Intravenous New Bag/Given 09/28/23 1628)  acetaminophen (TYLENOL) tablet 1,000 mg (1,000 mg Oral Given 09/28/23 1629)  iohexol (OMNIPAQUE) 300 MG/ML solution 80 mL (80 mLs Intravenous Contrast Given 09/28/23 1723)  cefTRIAXone (ROCEPHIN) 1 g in sodium chloride 0.9 % 100 mL IVPB (1 g Intravenous New Bag/Given 09/28/23 1819)    ED Course/ Medical Decision Making/ A&P Clinical Course as of 09/28/23 1852  Sat Sep 28, 2023  1621 Stable back and flank pain  [CC]    Clinical Course User Index [CC] Countryman,  Chase, MD                                 Medical Decision Making Patient is an 89 year old female, here for urinary complaints, that been going on for the last 2 to 3 days.  She reports dysuria, increased frequency, urgency, and pain in her back.  She also states she has a history of kidney stones.  Will obtain a CT abdomen pelvis, to evaluate for possible kidney stone as well as urinalysis, to make sure no infected kidney stone.  She is not  incontinent.  She is afebrile, nontachycardic overall well-appearing.  Fluids, Tylenol for pain control  Amount and/or Complexity of Data Reviewed Labs: ordered.    Details: Many white blood cells, many leukocytes, and rare bacteria in the urine.  Mild leukocytosis of 10.6 Radiology: ordered.    Details: CT and pelvis shows no acute findings Discussion of management or test interpretation with external provider(s): Discussed with patient, she has a UTI, which may be possible spreading to the kidneys, we we will treat her with 1 dose of ceftriaxone, and cefpodoxime, for outpatient.  I provided her with a written prescription, and and discharged her home with strict return precautions.  She was informed, if she has worsening pain, intractable nausea, vomiting, or fevers, to return to the ER.  She voiced understanding.  Written prescription given, as patient stated she wanted go to a drugstore that was not open tomorrow, and cannot think of another drugstore.  Risk OTC drugs. Prescription drug management.    Final Clinical Impression(s) / ED Diagnoses Final diagnoses:  Acute cystitis without hematuria    Rx / DC Orders ED Discharge Orders          Ordered    cefpodoxime (VANTIN) 200 MG tablet  2 times daily        09/28/23 1837              Jo Cerone, Harley Alto, PA 09/28/23 1859    Glyn Ade, MD 09/29/23 1306

## 2023-09-28 NOTE — ED Triage Notes (Signed)
 Per [facility]. For past week flank, back pain. Painful urination and hematuria.   BP 150/78 HR afib 90 O2 100 on 2L (for comfort level) CBG 121

## 2023-09-28 NOTE — Discharge Instructions (Addendum)
 You have a urinary tract infection, or possible kidney infection.  Take antibiotics as prescribed.  Return to the ER if you feel like your symptoms are worsening.  Your sodium was slightly low today, please follow-up with your primary care doctor, in regards to this.

## 2023-09-30 ENCOUNTER — Telehealth: Payer: Self-pay

## 2023-09-30 NOTE — Telephone Encounter (Signed)
 Marland Kitchen

## 2023-09-30 NOTE — Telephone Encounter (Signed)
 Per chart review pt went to [facility] ED on 09/28/23. Sending note to Dr Patsy Lager.

## 2023-10-01 NOTE — Progress Notes (Unsigned)
     Aadit Hagood T. Inocencio Roy, MD, CAQ Sports Medicine [facility] at [facility] [address] [place], [address]  Phone: [phone]  FAX: [phone]  Debra White - 89 y.o. female  MRN [id number]  Date of Birth: 05/27/1934  Date: 10/02/2023  PCP: Hannah Beat, MD  Referral: Hannah Beat, MD  No chief complaint on file.  Subjective:   Debra White is a 89 y.o. very pleasant female patient with There is no height or weight on file to calculate BMI. who presents with the following:  Patient is a complex elderly patient with multiple medical problems, she presents for ER follow-up after UTI.  I just saw her and met the patient less than a month ago for a new patient visit.  She was sent home from the ER on Cefpodoxime.  She received a gram of Rocephin in the office.    Review of Systems is noted in the HPI, as appropriate  Objective:   There were no vitals taken for this visit.  GEN: No acute distress; alert,appropriate. PULM: Breathing comfortably in no respiratory distress PSYCH: Normally interactive.   Laboratory and Imaging Data:  Assessment and Plan:   ***

## 2023-10-02 ENCOUNTER — Encounter: Payer: Self-pay | Admitting: Family Medicine

## 2023-10-02 ENCOUNTER — Ambulatory Visit (INDEPENDENT_AMBULATORY_CARE_PROVIDER_SITE_OTHER): Admitting: Family Medicine

## 2023-10-02 VITALS — BP 130/78 | HR 88 | Temp 98.4°F | Ht 62.75 in | Wt 120.2 lb

## 2023-10-02 DIAGNOSIS — E871 Hypo-osmolality and hyponatremia: Secondary | ICD-10-CM | POA: Diagnosis not present

## 2023-10-02 DIAGNOSIS — D508 Other iron deficiency anemias: Secondary | ICD-10-CM

## 2023-10-02 DIAGNOSIS — N3001 Acute cystitis with hematuria: Secondary | ICD-10-CM

## 2023-10-02 LAB — CBC WITH DIFFERENTIAL/PLATELET
Basophils Absolute: 0.1 10*3/uL (ref 0.0–0.1)
Basophils Relative: 0.9 % (ref 0.0–3.0)
Eosinophils Absolute: 0.4 10*3/uL (ref 0.0–0.7)
Eosinophils Relative: 5.1 % — ABNORMAL HIGH (ref 0.0–5.0)
HCT: 37.3 % (ref 36.0–46.0)
Hemoglobin: 12.5 g/dL (ref 12.0–15.0)
Lymphocytes Relative: 21.4 % (ref 12.0–46.0)
Lymphs Abs: 1.7 10*3/uL (ref 0.7–4.0)
MCHC: 33.5 g/dL (ref 30.0–36.0)
MCV: 95.5 fl (ref 78.0–100.0)
Monocytes Absolute: 0.9 10*3/uL (ref 0.1–1.0)
Monocytes Relative: 11.9 % (ref 3.0–12.0)
Neutro Abs: 4.7 10*3/uL (ref 1.4–7.7)
Neutrophils Relative %: 60.7 % (ref 43.0–77.0)
Platelets: 254 10*3/uL (ref 150.0–400.0)
RBC: 3.91 Mil/uL (ref 3.87–5.11)
RDW: 13.6 % (ref 11.5–15.5)
WBC: 7.7 10*3/uL (ref 4.0–10.5)

## 2023-10-02 LAB — BASIC METABOLIC PANEL WITH GFR
BUN: 15 mg/dL (ref 6–23)
CO2: 31 meq/L (ref 19–32)
Calcium: 9.2 mg/dL (ref 8.4–10.5)
Chloride: 91 meq/L — ABNORMAL LOW (ref 96–112)
Creatinine, Ser: 0.79 mg/dL (ref 0.40–1.20)
GFR: 67.1 mL/min (ref >60.00)
Glucose, Bld: 77 mg/dL (ref 70–99)
Potassium: 4.5 meq/L (ref 3.5–5.1)
Sodium: 128 meq/L — ABNORMAL LOW (ref 135–145)

## 2023-10-02 LAB — FOLATE: Folate: 10.6 ng/mL (ref >5.9)

## 2023-10-02 LAB — IBC + FERRITIN
Ferritin: 40.8 ng/mL (ref 10.0–291.0)
Iron: 97 ug/dL (ref 42–145)
Saturation Ratios: 22.9 % (ref 20.0–50.0)
TIBC: 422.8 ug/dL (ref 250.0–450.0)
Transferrin: 302 mg/dL (ref 212.0–360.0)

## 2023-10-02 LAB — VITAMIN B12: Vitamin B-12: 1193 pg/mL — ABNORMAL HIGH (ref 211–911)

## 2023-10-14 ENCOUNTER — Telehealth: Payer: Self-pay

## 2023-10-14 NOTE — Telephone Encounter (Signed)
 Copied from CRM [id number]. Topic: Clinical - Lab/Test Results >> Oct 11, 2023  2:16 PM Melissa C wrote: Reason for CRM: Patient's sister-in-law Monroe Antigua calling on behalf of patient regarding lab results. I relayed message from doctor, however she would still like to speak with someone about her levels. Please contact Monroe Antigua regarding patient's results. Thank you. [phone]

## 2023-10-14 NOTE — Telephone Encounter (Signed)
 Lab results and Dr. Tasia Farr comments discussed with Debra White via telephone.  She will call back to schedule lab appointment in 2 months.  See result note from 10/02/23.

## 2023-10-22 ENCOUNTER — Ambulatory Visit: Payer: PPO | Admitting: Nurse Practitioner

## 2023-10-30 DIAGNOSIS — R300 Dysuria: Secondary | ICD-10-CM | POA: Diagnosis not present

## 2023-10-30 DIAGNOSIS — R102 Pelvic and perineal pain: Secondary | ICD-10-CM | POA: Diagnosis not present

## 2023-10-30 DIAGNOSIS — R8279 Other abnormal findings on microbiological examination of urine: Secondary | ICD-10-CM | POA: Diagnosis not present

## 2023-10-30 DIAGNOSIS — N3001 Acute cystitis with hematuria: Secondary | ICD-10-CM | POA: Diagnosis not present

## 2023-11-11 ENCOUNTER — Telehealth: Payer: Self-pay | Admitting: *Deleted

## 2023-11-11 DIAGNOSIS — E559 Vitamin D deficiency, unspecified: Secondary | ICD-10-CM

## 2023-11-11 DIAGNOSIS — N952 Postmenopausal atrophic vaginitis: Secondary | ICD-10-CM | POA: Diagnosis not present

## 2023-11-11 DIAGNOSIS — N3001 Acute cystitis with hematuria: Secondary | ICD-10-CM | POA: Diagnosis not present

## 2023-11-11 DIAGNOSIS — E782 Mixed hyperlipidemia: Secondary | ICD-10-CM

## 2023-11-11 DIAGNOSIS — R300 Dysuria: Secondary | ICD-10-CM | POA: Diagnosis not present

## 2023-11-11 DIAGNOSIS — Z131 Encounter for screening for diabetes mellitus: Secondary | ICD-10-CM

## 2023-11-11 NOTE — Telephone Encounter (Signed)
-----   Message from Gerry Krone sent at 11/11/2023  8:35 AM EDT ----- Regarding: Lab orders for Mon, 6.2.25 Patient is scheduled for CPX labs, please order future labs, Thanks , Anselmo Kings

## 2023-11-12 ENCOUNTER — Ambulatory Visit: Payer: PPO | Admitting: Nurse Practitioner

## 2023-11-19 ENCOUNTER — Ambulatory Visit
Admission: RE | Admit: 2023-11-19 | Discharge: 2023-11-19 | Disposition: A | Discharge status: Home / Self Care | Source: Ambulatory Visit | Attending: Family Medicine | Admitting: Family Medicine

## 2023-11-19 DIAGNOSIS — Z1231 Encounter for screening mammogram for malignant neoplasm of breast: Secondary | ICD-10-CM

## 2023-11-21 ENCOUNTER — Other Ambulatory Visit: Payer: Self-pay | Admitting: Family Medicine

## 2023-11-21 DIAGNOSIS — F410 Panic disorder [episodic paroxysmal anxiety] without agoraphobia: Secondary | ICD-10-CM

## 2023-11-21 NOTE — Telephone Encounter (Signed)
 Last office visit 10/02/2023 for acute cystitis.  Last refilled 09/24/2023 for #30 with 1 refill.  Next Appt: 03/02/24 for 6 month follow up.

## 2023-11-24 ENCOUNTER — Encounter (HOSPITAL_COMMUNITY): Payer: Self-pay | Admitting: Emergency Medicine

## 2023-11-24 ENCOUNTER — Emergency Department (HOSPITAL_COMMUNITY)
Admission: EM | Admit: 2023-11-24 | Discharge: 2023-11-24 | Disposition: A | Discharge status: Home / Self Care | Attending: Emergency Medicine | Admitting: Emergency Medicine

## 2023-11-24 ENCOUNTER — Other Ambulatory Visit: Payer: Self-pay

## 2023-11-24 DIAGNOSIS — R102 Pelvic and perineal pain: Secondary | ICD-10-CM | POA: Diagnosis not present

## 2023-11-24 DIAGNOSIS — E559 Vitamin D deficiency, unspecified: Secondary | ICD-10-CM | POA: Diagnosis not present

## 2023-11-24 DIAGNOSIS — Z7901 Long term (current) use of anticoagulants: Secondary | ICD-10-CM | POA: Diagnosis not present

## 2023-11-24 DIAGNOSIS — Z91040 Latex allergy status: Secondary | ICD-10-CM | POA: Insufficient documentation

## 2023-11-24 DIAGNOSIS — R300 Dysuria: Secondary | ICD-10-CM | POA: Diagnosis present

## 2023-11-24 DIAGNOSIS — Z79899 Other long term (current) drug therapy: Secondary | ICD-10-CM | POA: Diagnosis not present

## 2023-11-24 DIAGNOSIS — N3001 Acute cystitis with hematuria: Secondary | ICD-10-CM | POA: Diagnosis not present

## 2023-11-24 LAB — CBC WITH DIFFERENTIAL/PLATELET
Abs Immature Granulocytes: 0.02 10*3/uL (ref 0.00–0.07)
Basophils Absolute: 0.1 10*3/uL (ref 0.0–0.1)
Basophils Relative: 1 %
Eosinophils Absolute: 0.1 10*3/uL (ref 0.0–0.5)
Eosinophils Relative: 2 %
HCT: 40.9 % (ref 36.0–46.0)
Hemoglobin: 12.9 g/dL (ref 12.0–15.0)
Immature Granulocytes: 0 %
Lymphocytes Relative: 20 %
Lymphs Abs: 1.3 10*3/uL (ref 0.7–4.0)
MCH: 31.9 pg (ref 26.0–34.0)
MCHC: 31.5 g/dL (ref 30.0–36.0)
MCV: 101.2 fL — ABNORMAL HIGH (ref 80.0–100.0)
Monocytes Absolute: 0.7 10*3/uL (ref 0.1–1.0)
Monocytes Relative: 10 %
Neutro Abs: 4.5 10*3/uL (ref 1.7–7.7)
Neutrophils Relative %: 67 %
Platelets: 226 10*3/uL (ref 150–400)
RBC: 4.04 MIL/uL (ref 3.87–5.11)
RDW: 12.5 % (ref 11.5–15.5)
WBC: 6.7 10*3/uL (ref 4.0–10.5)
nRBC: 0 % (ref 0.0–0.2)

## 2023-11-24 LAB — COMPREHENSIVE METABOLIC PANEL WITH GFR
ALT: 21 U/L (ref 0–44)
AST: 25 U/L (ref 15–41)
Albumin: 4.9 g/dL (ref 3.5–5.0)
Alkaline Phosphatase: 94 U/L (ref 38–126)
Anion gap: 12 (ref 5–15)
BUN: 19 mg/dL (ref 8–23)
CO2: 26 mmol/L (ref 22–32)
Calcium: 9.4 mg/dL (ref 8.9–10.3)
Chloride: 93 mmol/L — ABNORMAL LOW (ref 98–111)
Creatinine, Ser: 0.77 mg/dL (ref 0.44–1.00)
GFR, Estimated: >60 mL/min (ref >60)
Glucose, Bld: 105 mg/dL — ABNORMAL HIGH (ref 70–99)
Potassium: 4.4 mmol/L (ref 3.5–5.1)
Sodium: 131 mmol/L — ABNORMAL LOW (ref 135–145)
Total Bilirubin: 0.8 mg/dL (ref 0.0–1.2)
Total Protein: 8.2 g/dL — ABNORMAL HIGH (ref 6.5–8.1)

## 2023-11-24 LAB — LIPID PANEL
Cholesterol: 192 mg/dL (ref 0–200)
HDL: 101 mg/dL (ref >40)
LDL Cholesterol: 72 mg/dL (ref 0–99)
Total CHOL/HDL Ratio: 1.9 ratio
Triglycerides: 94 mg/dL (ref <150)
VLDL: 19 mg/dL (ref 0–40)

## 2023-11-24 LAB — URINALYSIS, ROUTINE W REFLEX MICROSCOPIC
Bacteria, UA: NONE SEEN
Bilirubin Urine: NEGATIVE
Glucose, UA: NEGATIVE mg/dL
Ketones, ur: NEGATIVE mg/dL
Nitrite: NEGATIVE
Protein, ur: 30 mg/dL — AB
RBC / HPF: >50 RBC/hpf (ref 0–5)
Specific Gravity, Urine: 1.025 (ref 1.005–1.030)
WBC, UA: >50 WBC/hpf (ref 0–5)
pH: 5 (ref 5.0–8.0)

## 2023-11-24 LAB — HEMOGLOBIN A1C
Hgb A1c MFr Bld: 5.7 % — ABNORMAL HIGH (ref 4.8–5.6)
Mean Plasma Glucose: 116.89 mg/dL

## 2023-11-24 LAB — VITAMIN D 25 HYDROXY (VIT D DEFICIENCY, FRACTURES): Vit D, 25-Hydroxy: 102.07 ng/mL — ABNORMAL HIGH (ref 30–100)

## 2023-11-24 MED ORDER — CEFPODOXIME PROXETIL 200 MG PO TABS: 200.0000 mg | ORAL_TABLET | Freq: Two times a day (BID) | ORAL | 0 refills | Status: DC

## 2023-11-24 MED ORDER — SODIUM CHLORIDE 0.9 % IV SOLN
1.0000 g | Freq: Once | INTRAVENOUS | Status: AC
  Administered 2023-11-24: 1 g via INTRAVENOUS
  Filled 2023-11-24: qty 10

## 2023-11-24 NOTE — Discharge Instructions (Signed)
 1.  Your urine test positive for urinary tract infection.  This has been a recurrent problem.  You were given a dose of Rocephin  in the emergency department.  This is an IV antibiotic that last for 24 hours.  Fill your prescription for cefpodoxime  and start it tomorrow at about lunchtime.  2.  Follow-up.  See your doctor and discuss further evaluation with urology for repeat urinary tract infections. 3.  Return to the emergency department if you get a fever, feel generally sick or worsening symptoms of any kind

## 2023-11-24 NOTE — ED Triage Notes (Signed)
 Pt. In from home w/ c/o dysuria and abdominal pain. Pt. States she was seen last month for UTI. ABT completed but no relief.

## 2023-11-24 NOTE — ED Provider Notes (Signed)
  EMERGENCY DEPARTMENT AT [facility] Provider Note   CSN: [id number] Arrival date & time: 11/24/23  4098     History  Chief Complaint  Patient presents with   Abdominal Pain   Dysuria    Debra White is a 89 y.o. female.  HPI Patient reports she had a sudden onset of lower suprapubic pain today.  She reports is very uncomfortable and sharp.  She reports she has had a kidney stone before and wondered if she had had a kidney stone.  Patient also reports she had a urinary tract infection a few months ago and completed antibiotic therapy.  She reports that she was doing better after antibiotic therapy.  Per review of EMR patient was treated with Vantin  and had Klebsiella which was pansensitive.  Patient reports yesterday she felt "a little unwell".  Today when she got up she felt that she had chills but did not measure temperature at home.    Home Medications Prior to Admission medications   Medication Sig Start Date End Date Taking? Authorizing Provider  cefpodoxime  (VANTIN ) 200 MG tablet Take 1 tablet (200 mg total) by mouth 2 (two) times daily. 11/24/23  Yes Wynetta Heckle, MD  acetaminophen  (TYLENOL ) 500 MG tablet Take 1,000 mg by mouth every 8 (eight) hours as needed for headache.     [provider]  amLODipine  (NORVASC ) 2.5 MG tablet Take 1 tablet (2.5 mg total) by mouth daily. 09/02/23   Copland, Jolena Nay, MD  apixaban  (ELIQUIS ) 2.5 MG TABS tablet Take  1 tablet   2 x /day  for Afib  & to Prevent Blood Clots 08/09/23   Croitoru, Mihai, MD  atenolol  (TENORMIN ) 100 MG tablet TAKE 1 TABLET BY MOUTH DAILY 09/02/23   Copland, Jolena Nay, MD  clonazePAM  (KLONOPIN ) 0.5 MG tablet TAKE 1/2 TABLET BY MOUTH ONCE TO TWICE DAILY AS NEEDED FOR CHRONIC ANXIETY AND PANIC ATTACKS 11/22/23   Copland, Jolena Nay, MD  conjugated estrogens (PREMARIN) vaginal cream Place 1 Applicatorful vaginally See admin instructions. Apply one applicatorful vaginally once a week    [provider]  Cyanocobalamin  (VITAMIN B 12 PO) Take 1 tablet by mouth daily.    [provider]  dicyclomine  (BENTYL ) 20 MG tablet Take 20 mg by mouth 4 (four) times daily. 09/12/23   [provider]  escitalopram  (LEXAPRO ) 10 MG tablet Take 1 tablet Daily for Mood & Chronic Anxiety 09/02/23   Copland, Jolena Nay, MD  estradiol (ESTRACE) 0.1 MG/GM vaginal cream Place 1 Applicatorful vaginally 2 (two) times a week. 08/26/23   [provider]  gabapentin  (NEURONTIN ) 600 MG tablet TAKE 1/2 TO 1 TABLET BY MOUTH 3 TIMES DAILY FOR FACIAL PAIN 07/29/23   Cranford, Tonya, NP  lovastatin  (MEVACOR ) 20 MG tablet TAKE 1 TABLET BY MOUTH EACH NIGHT AT BEDTIME FOR CHOLESTEROL 07/05/23   Wilkinson, Dana E, FNP  mirtazapine  (REMERON ) 15 MG tablet Take 7.5 mg by mouth at bedtime. 06/04/23   [provider]  olmesartan  (BENICAR ) 40 MG tablet Take 1 tablet (40 mg total) by mouth daily. 09/13/23   Webb, Padonda B, FNP  omeprazole  (PRILOSEC ) 40 MG capsule Take  1 capsule   Daily  to Prevent  Heartburn & Indigestion                                                         /                                                                   TAKE                                         BY                                                 MOUTH 09/02/23   Copland, Jolena Nay, MD  polyvinyl alcohol  (LIQUIFILM TEARS) 1.4 % ophthalmic solution Place 1 drop into both eyes 2 (two) times daily as needed for dry eyes.    [provider]  Trolamine Salicylate (BLUE-EMU MAXIMUM PAIN RELIEF EX) Apply topically.    [provider]  Vitamin D -Vitamin K (VITAMIN K2-VITAMIN D3 PO) Take 1 tablet by mouth daily. 90 mcg of K2, 5,000 units of D3    [provider]  Zinc 50 MG TABS Take by mouth.     [provider]      Allergies    Latex, Amoxicillin , Aspirin, Barbiturates, Celebrex [celecoxib], Codeine, Evista [raloxifene], Lipitor [atorvastatin], Morphine  and codeine, Oruvail [ketoprofen], Pantoprazole , Paraffin, Prochlorperazine, Proloprim [trimethoprim], Venlafaxine , Vibra-tab [doxycycline], Vioxx [rofecoxib], Betadine [povidone iodine], and Caffeine    Review of Systems   Review of Systems  Physical Exam Updated Vital Signs BP (!) 158/86 (BP Location: Right Arm)   Pulse 72   Temp 98.1 F (36.7 C) (Oral)   Resp 18   Ht 5\' 4"  (1.626 m)   Wt 54.4 kg   SpO2 98%   BMI 20.60 kg/m  Physical Exam Constitutional:      Comments: Well-nourished well-developed.  Alert nontoxic.  Very good physical condition for age.  HENT:     Mouth/Throat:     Pharynx: Oropharynx is clear.  Eyes:     Extraocular Movements: Extraocular movements intact.  Cardiovascular:     Rate and Rhythm: Normal rate and regular rhythm.  Pulmonary:     Effort: Pulmonary effort is normal.     Breath sounds: Normal breath sounds.  Abdominal:     Comments: Abdomen soft.  No guarding.  Mildly reproducible suprapubic tenderness to palpation.  No CVA tenderness.  Musculoskeletal:        General: No swelling. Normal range of motion.     Right lower leg: No edema.     Left lower leg: No edema.  Skin:    General: Skin is warm and dry.  Neurological:     General: No focal deficit present.     Mental Status: She is oriented to person, place, and time.  Motor: No weakness.     Coordination: Coordination normal.  Psychiatric:        Mood and Affect: Mood normal.     ED Results / Procedures / Treatments   Labs (all labs ordered are listed, but only abnormal results are displayed) Labs Reviewed  URINALYSIS, ROUTINE W REFLEX MICROSCOPIC - Abnormal; Notable for the following components:      Result Value   APPearance CLOUDY (*)    Hgb urine dipstick SMALL (*)    Protein, ur 30 (*)     Leukocytes,Ua LARGE (*)    All other components within normal limits  COMPREHENSIVE METABOLIC PANEL WITH GFR - Abnormal; Notable for the following components:   Sodium 131 (*)    Chloride 93 (*)    Glucose, Bld 105 (*)    Total Protein 8.2 (*)    All other components within normal limits  CBC WITH DIFFERENTIAL/PLATELET - Abnormal; Notable for the following components:   MCV 101.2 (*)    All other components within normal limits  URINE CULTURE  LIPID PANEL  HEMOGLOBIN A1C  VITAMIN D  25 HYDROXY (VIT D DEFICIENCY, FRACTURES)    EKG None  Radiology No results found.  Procedures Procedures    Medications Ordered in ED Medications  cefTRIAXone  (ROCEPHIN ) 1 g in sodium chloride  0.9 % 100 mL IVPB (0 g Intravenous Stopped 11/24/23 1236)    ED Course/ Medical Decision Making/ A&P                                 Medical Decision Making Amount and/or Complexity of Data Reviewed Labs: ordered.  Risk Prescription drug management.   Patient presents as outlined.  She has had prior UTI which was Klebsiella positive on culture.  Patient reports some mild malaise yesterday without focal symptoms then fairly acute suprapubic pain today.  Will proceed with diagnostic evaluation for UTI\pyelonephritis.  Patient reports history of diverticulosis this was seen on CT scan earlier this year.  Diverticulitis is within the differential.  Urinalysis grossly positive with greater than 50 red cells greater than 50 white cells arduously esterase.  CBC normal with normal differential metabolic panel odium 131 chloride 93 GFR normal greater than 60  Patient has had recurrent UTI.  Review of EMR indicates she had Klebsiella that was pansensitive.  Will treat with 1 dose of IV Rocephin .  Patient's abdominal pain is mild and she does not have leukocytosis or fever.  At this time I have lower suspicion for active diverticulitis.  Patient has CT scan done within 2 months that showed diverticulosis but no  other acute findings.  Patient clinically well appearance with normal vital signs.  At this time we will treat for UTI with repeat course of cefpodoxime .  Patient has good outpatient follow-up.  She has seen Dr. [name] in the past.  At this time I do think she needs repeat evaluation with urology for recurrent UTI.  We have reviewed strict return precautions for any worsening abdominal pain or back pain, fever or general malaise.  Patient voices understanding.  Plan results reviewed with 2 family members at bedside.         Final Clinical Impression(s) / ED Diagnoses Final diagnoses:  Acute cystitis with hematuria    Rx / DC Orders ED Discharge Orders          Ordered    cefpodoxime  (VANTIN ) 200 MG tablet  2 times daily  11/24/23 1239              Wynetta Heckle, MD 11/24/23 1254

## 2023-11-25 ENCOUNTER — Other Ambulatory Visit

## 2023-11-25 LAB — URINE CULTURE

## 2023-12-03 ENCOUNTER — Ambulatory Visit (INDEPENDENT_AMBULATORY_CARE_PROVIDER_SITE_OTHER)

## 2023-12-03 VITALS — Ht 62.75 in | Wt 120.0 lb

## 2023-12-03 DIAGNOSIS — Z0000 Encounter for general adult medical examination without abnormal findings: Secondary | ICD-10-CM

## 2023-12-03 NOTE — Patient Instructions (Signed)
 Debra White , Thank you for taking time out of your busy schedule to complete your Annual Wellness Visit with me. I enjoyed our conversation and look forward to speaking with you again next year. I, as well as your care team,  appreciate your ongoing commitment to your health goals. Please review the following plan we discussed and let me know if I can assist you in the future. Your Game plan/ To Do List     Follow up Visits: Next Medicare AWV with our clinical staff: 12/03/24 @ 3:40pm   Have you seen your provider in the last 6 months (3 months if uncontrolled diabetes)? Yes Next Office Visit with your provider: 12/04/23  Clinician Recommendations:  Aim for 30 minutes of exercise or brisk walking, 6-8 glasses of water, and 5 servings of fruits and vegetables each day.       This is a list of the screening recommended for you and due dates:  Health Maintenance  Topic Date Due   Flu Shot  01/24/2024   Medicare Annual Wellness Visit  12/02/2024   Pneumonia Vaccine  Completed   DEXA scan (bone density measurement)  Completed   HPV Vaccine  Aged Out   Meningitis B Vaccine  Aged Out   DTaP/Tdap/Td vaccine  Discontinued   Colon Cancer Screening  Discontinued   COVID-19 Vaccine  Discontinued   Zoster (Shingles) Vaccine  Discontinued    Advanced directives: (Copy Requested) Please bring a copy of your health care power of attorney and living will to the office to be added to your chart at your convenience. You can mail to [facility] [address] [place], [place] [address] or email to [email or web address] Advance Care Planning is important because it:  [x]  Makes sure you receive the medical care that is consistent with your values, goals, and preferences  [x]  It provides guidance to your family and loved ones and reduces their decisional burden about whether or not they are making the right decisions based on your wishes.  Follow the link provided in your after  visit summary or read over the paperwork we have mailed to you to help you started getting your Advance Directives in place. If you need assistance in completing these, please reach out to us  so that we can help you!

## 2023-12-03 NOTE — Progress Notes (Unsigned)
     Debra White T. Debra Levitz, MD, CAQ Sports Medicine [facility] at [facility] [address] [place], [address]  Phone: [phone]  FAX: [phone]  Debra White - 89 y.o. female  MRN [id number]  Date of Birth: 26-Jul-1933  Date: 12/04/2023  PCP: Debra Curt, MD  Referral: Debra Curt, MD  No chief complaint on file.  Subjective:   Debra White is a 89 y.o. very pleasant female patient with There is no height or weight on file to calculate BMI. who presents with the following:  This is a complicated 89 year old medical patient who I just met a few months ago.  She is of Dr. Cassondra White patient.  She was seen in the ER on November 24, 2023, and she was diagnosed with a UTI.  She was started on cefpodoxime  200 mg twice daily.  She received 1 g of Rocephin  in the ER.  She has seen Dr. Inga White in the past.    Review of Systems is noted in the HPI, as appropriate  Objective:   There were no vitals taken for this visit.  GEN: No acute distress; alert,appropriate. PULM: Breathing comfortably in no respiratory distress PSYCH: Normally interactive.   Laboratory and Imaging Data:  Assessment and Plan:   ***

## 2023-12-03 NOTE — Progress Notes (Signed)
 Please attest and cosign this visit due to patients primary care provider not being in the office at the time the visit was completed.    Subjective:   Debra White is a 89 y.o. who presents for a Medicare Wellness preventive visit.  As a reminder, Annual Wellness Visits don't include a physical exam, and some assessments may be limited, especially if this visit is performed virtually. We may recommend an in-person follow-up visit with your provider if needed.  Visit Complete: Virtual I connected with  Jamelle Mcalpine on 12/03/23 by a audio enabled telemedicine application and verified that I am speaking with the correct person using two identifiers.  Patient Location: Home  Provider Location: Office/Clinic  I discussed the limitations of evaluation and management by telemedicine. The patient expressed understanding and agreed to proceed.  Vital Signs: Because this visit was a virtual/telehealth visit, some criteria may be missing or patient reported. Any vitals not documented were not able to be obtained and vitals that have been documented are patient reported.  VideoDeclined- This patient declined Librarian, academic. Therefore the visit was completed with audio only.  Persons Participating in Visit: Patient.  AWV Questionnaire: No: Patient Medicare AWV questionnaire was not completed prior to this visit.  Cardiac Risk Factors include: advanced age (>54men, >58 women);dyslipidemia;hypertension;sedentary lifestyle     Objective:     Today's Vitals   12/03/23 1507  Weight: 120 lb (54.4 kg)  Height: 5' 2.75" (1.594 m)  PainSc: 0-No pain   Body mass index is 21.43 kg/m.     11/24/2023    9:35 AM 09/28/2023    4:10 PM 08/25/2023    6:43 AM 10/26/2022    6:09 AM 10/22/2022   10:46 AM 10/20/2021   11:16 AM 10/20/2020    2:23 PM  Advanced Directives  Does Patient Have a Medical Advance Directive? No Yes No Yes Yes Yes Yes  Type of Surveyor, minerals;Living will  Living will Healthcare Power of [place];Living will Living will;Healthcare Power of Attorney Living will  Does patient want to make changes to medical advance directive?     No - Patient declined No - Patient declined No - Patient declined  Copy of Healthcare Power of Attorney in Chart?     No - copy requested No - copy requested   Would patient like information on creating a medical advance directive? No - Patient declined          Current Medications (verified) Outpatient Encounter Medications as of 12/03/2023  Medication Sig   acetaminophen  (TYLENOL ) 500 MG tablet Take 1,000 mg by mouth every 8 (eight) hours as needed for headache.    amLODipine  (NORVASC ) 2.5 MG tablet Take 1 tablet (2.5 mg total) by mouth daily.   apixaban  (ELIQUIS ) 2.5 MG TABS tablet Take  1 tablet   2 x /day  for Afib  & to Prevent Blood Clots   atenolol  (TENORMIN ) 100 MG tablet TAKE 1 TABLET BY MOUTH DAILY   cefpodoxime  (VANTIN ) 200 MG tablet Take 1 tablet (200 mg total) by mouth 2 (two) times daily.   clonazePAM  (KLONOPIN ) 0.5 MG tablet TAKE 1/2 TABLET BY MOUTH ONCE TO TWICE DAILY AS NEEDED FOR CHRONIC ANXIETY AND PANIC ATTACKS   conjugated estrogens (PREMARIN) vaginal cream Place 1 Applicatorful vaginally See admin instructions. Apply one applicatorful vaginally once a week   Cyanocobalamin  (VITAMIN B 12 PO) Take 1 tablet by mouth daily.   dicyclomine  (BENTYL ) 20  MG tablet Take 20 mg by mouth 4 (four) times daily.   escitalopram  (LEXAPRO ) 10 MG tablet Take 1 tablet Daily for Mood & Chronic Anxiety   estradiol (ESTRACE) 0.1 MG/GM vaginal cream Place 1 Applicatorful vaginally 2 (two) times a week.   gabapentin  (NEURONTIN ) 600 MG tablet TAKE 1/2 TO 1 TABLET BY MOUTH 3 TIMES DAILY FOR FACIAL PAIN   lovastatin  (MEVACOR ) 20 MG tablet TAKE 1 TABLET BY MOUTH EACH NIGHT AT BEDTIME FOR CHOLESTEROL   mirtazapine  (REMERON ) 15 MG tablet Take 7.5 mg by mouth at bedtime.   olmesartan   (BENICAR ) 40 MG tablet Take 1 tablet (40 mg total) by mouth daily.   omeprazole  (PRILOSEC ) 40 MG capsule Take                                                                                                                                                                                                             1 capsule   Daily  to Prevent  Heartburn & Indigestion                                                         /                                                                   TAKE                                         BY                                                 MOUTH   polyvinyl alcohol  (LIQUIFILM TEARS) 1.4 % ophthalmic solution Place 1 drop into both eyes 2 (two) times daily as needed for dry eyes.   Trolamine Salicylate (BLUE-EMU MAXIMUM PAIN RELIEF EX) Apply topically.   Vitamin D -Vitamin K (VITAMIN K2-VITAMIN D3 PO) Take 1 tablet by  mouth daily. 90 mcg of K2, 5,000 units of D3   Zinc 50 MG TABS Take by mouth.   No facility-administered encounter medications on file as of 12/03/2023.    Allergies (verified) Latex, Amoxicillin , Aspirin, Barbiturates, Celebrex [celecoxib], Codeine, Evista [raloxifene], Lipitor [atorvastatin], Morphine  and codeine, Oruvail [ketoprofen], Pantoprazole , Paraffin, Prochlorperazine, Proloprim [trimethoprim], Venlafaxine , Vibra-tab [doxycycline], Vioxx [rofecoxib], Betadine [povidone iodine], and Caffeine   History: Past Medical History:  Diagnosis Date   Acute on chronic combined systolic and diastolic CHF (congestive heart failure) (HCC) 06/29/2019   Diverticulosis of colon (without mention of hemorrhage) 2007   Emphysema of lung (HCC)    Family history of colon cancer    sister   Fibromyalgia    GAD (generalized anxiety disorder)    GERD (gastroesophageal reflux disease)    History of embolic stroke 06/29/2019   Old Rt brain stroke on MRI and history of splenic and renal infarcts in the past     Hyperlipemia    Hypertension    Iron  deficiency anemia 05/31/2020   Kidney stones    Renal infarct (HCC)    Splenic infarct    Past Surgical History:  Procedure Laterality Date   ABDOMINAL HYSTERECTOMY     BACK SURGERY     X2   BREAST EXCISIONAL BIOPSY     CERVICAL DISC SURGERY     COLONOSCOPY     FACIAL FRACTURE SURGERY     HAND SURGERY     BIL    ORIF TRIPOD FRACTURE  07/13/2011   Procedure: OPEN REDUCTION INTERNAL FIXATION (ORIF) TRIPOD FRACTURE;  Surgeon: Dorothe Gaster, MD;  Location: [facility] OR;  Service: ENT;  Laterality: Right;  ORIF RIGHT ZYGOMA, ORBITAL FLOOR EXPLORATION WITH FROST STITCH (TEMPORARY TARSORRHAPHY)   PTOSIS REPAIR Bilateral 11/22/2016   Procedure: INTERNAL PTOSIS REPAIR;  Surgeon: Karan Osgood, MD;  Location: [facility] OR;  Service: Ophthalmology;  Laterality: Bilateral;   SHOULDER ARTHROSCOPY W/ ROTATOR CUFF REPAIR     LFT   Family History  Problem Relation Age of Onset   Heart disease Mother    Heart disease Father    Heart attack Father    Breast cancer Sister 22   Colon cancer Sister    Heart disease Sister    Cancer Sister        colon   Cancer Sister        colon   Hypertension Daughter    Hyperlipidemia Daughter    Social History   Socioeconomic History   Marital status: Married    Spouse name: Not on file   Number of children: 1   Years of education: 12   Highest education level: Not on file  Occupational History   Occupation: Retired    Associate Professor: RETIRED  Tobacco Use   Smoking status: Never    Passive exposure: Never   Smokeless tobacco: Never  Vaping Use   Vaping status: Never Used  Substance and Sexual Activity   Alcohol  use: No   Drug use: No   Sexual activity: Never    Comment: HYSTER  Other Topics Concern   Not on file  Social History Narrative   [place]   Retired from [employer]   Widow   Right-handed.   No caffeine use.   Social Drivers of Corporate investment banker Strain: Low Risk  (12/03/2023)   Overall Financial  Resource Strain (CARDIA)    Difficulty of Paying Living Expenses: Not hard at all  Food  Insecurity: No Food Insecurity (12/03/2023)   Hunger Vital Sign    Worried About Running Out of Food in the Last Year: Never true    Ran Out of Food in the Last Year: Never true  Transportation Needs: No Transportation Needs (12/03/2023)   PRAPARE - Administrator, Civil Service (Medical): No    Lack of Transportation (Non-Medical): No  Physical Activity: Inactive (12/03/2023)   Exercise Vital Sign    Days of Exercise per Week: 0 days    Minutes of Exercise per Session: 0 min  Stress: No Stress Concern Present (12/03/2023)   [employer] of Occupational Health - Occupational Stress Questionnaire    Feeling of Stress : Not at all  Social Connections: Moderately Isolated (12/03/2023)   Social Connection and Isolation Panel [NHANES]    Frequency of Communication with Friends and Family: More than three times a week    Frequency of Social Gatherings with Friends and Family: More than three times a week    Attends Religious Services: More than 4 times per year    Active Member of [employer] or Organizations: No    Attends Banker Meetings: Never    Marital Status: Widowed    Tobacco Counseling Counseling given: Not Answered    Clinical Intake:  Pre-visit preparation completed: Yes  Pain : No/denies pain Pain Score: 0-No pain     BMI - recorded: 21.43 Nutritional Status: BMI of 19-24  Normal Nutritional Risks: None Diabetes: No  Lab Results  Component Value Date   HGBA1C 5.7 (H) 11/24/2023   HGBA1C 5.9 (H) 04/26/2023   HGBA1C 6.1 (H) 07/02/2022     How often do you need to have someone help you when you read instructions, pamphlets, or other written materials from your doctor or pharmacy?: 1 - Never  Interpreter Needed?: No  Comments: lives alone/brother lives next door Information entered by :: B.Riyad Keena,LPN   Activities of Daily Living     12/03/2023     3:19 PM 04/25/2023   11:21 PM  In your present state of health, do you have any difficulty performing the following activities:  Hearing? 1 0  Vision? 1 0  Difficulty concentrating or making decisions? 0 0  Walking or climbing stairs? 1 0  Dressing or bathing? 0 0  Doing errands, shopping? 1 0  Preparing Food and eating ? N   Using the Toilet? N   In the past six months, have you accidently leaked urine? N   Do you have problems with loss of bowel control? N   Managing your Medications? N   Managing your Finances? N   Housekeeping or managing your Housekeeping? Y     Patient Care Team: Scherrie Curt, MD as PCP - General (Family Medicine) Croitoru, Karyl Paget, MD as PCP - Cardiology (Cardiology) Ronni Colace, MD as Consulting Physician (Ophthalmology) Asencion Blacksmith, MD (Inactive) as Consulting Physician (Gastroenterology) Jhonny Moss, MD as Consulting Physician (Neurology) Lenton Rail, MD as Consulting Physician (Otolaryngology) Alonza Arthurs, [facility] (Inactive) as Pharmacist (Pharmacist)  I have updated your Care Teams any recent Medical Services you may have received from other providers in the past year.     Assessment:    This is a routine wellness examination for Debra White.  Hearing/Vision screen Hearing Screening - Comments:: Pt says her hearing is not good and purchased 3 hearing aids but they do not help Vision Screening - Comments:: Pt says her vision is ok Dr Annabell Key   Goals Addressed  This Visit's Progress    Patient Stated       No goals       Depression Screen     12/03/2023    3:15 PM 09/02/2023    9:45 AM 04/25/2023   11:20 PM 10/22/2022   10:46 AM 06/05/2022    2:03 PM 10/20/2020    2:27 PM 08/06/2020   10:52 PM  PHQ 2/9 Scores  PHQ - 2 Score 1 1 0 1 0 1 0    Fall Risk     12/03/2023    3:12 PM 04/25/2023   11:20 PM 10/22/2022   10:46 AM 06/05/2022    2:03 PM 10/20/2021   11:25 AM  Fall Risk   Falls in the past year? 0 0 0 0 1   Number falls in past yr: 0  0  0  Injury with Fall? 0  0  0  Risk for fall due to : Impaired balance/gait;Impaired mobility No Fall Risks Impaired balance/gait No Fall Risks No Fall Risks  Follow up Education provided;Falls prevention discussed Education provided;Falls evaluation completed;Falls prevention discussed Falls evaluation completed;Falls prevention discussed Falls prevention discussed;Education provided;Falls evaluation completed Falls prevention discussed;Falls evaluation completed    MEDICARE RISK AT HOME:  Medicare Risk at Home Any stairs in or around the home?: Yes (has ramp) If so, are there any without handrails?: Yes Home free of loose throw rugs in walkways, pet beds, electrical cords, etc?: Yes Adequate lighting in your home to reduce risk of falls?: Yes Life alert?: No Use of a cane, walker or w/c?: Yes Grab bars in the bathroom?: Yes Shower chair or bench in shower?: Yes Elevated toilet seat or a handicapped toilet?: Yes  TIMED UP AND GO:  Was the test performed?  No  Cognitive Function: 6CIT completed    06/16/2019   10:33 AM  MMSE - Mini Mental State Exam  Orientation to time 5  Orientation to Place 5  Registration 3  Attention/ Calculation 5  Recall 2  Language- name 2 objects 2  Language- repeat 1  Language- follow 3 step command 3  Language- read & follow direction 1  Write a sentence 1  Copy design 1  Total score 29        12/03/2023    3:22 PM  6CIT Screen  What Year? 0 points  What month? 0 points  What time? 0 points  Count back from 20 0 points  Months in reverse 4 points  Repeat phrase 10 points  Total Score 14 points    Immunizations Immunization History  Administered Date(s) Administered   Influenza, High Dose Seasonal PF 03/25/2014, 04/05/2016, 06/02/2018, 03/12/2019, 03/23/2020, 05/02/2021, 06/05/2022, 04/26/2023   Influenza-Unspecified 03/09/2013, 03/11/2015   PFIZER(Purple Top)SARS-COV-2 Vaccination 06/01/2020,  06/22/2020   Pneumococcal Conjugate-13 12/21/2015   Pneumococcal Polysaccharide-23 06/25/2002   Td 06/26/2003    Screening Tests Health Maintenance  Topic Date Due   INFLUENZA VACCINE  01/24/2024   Medicare Annual Wellness (AWV)  12/02/2024   Pneumonia Vaccine 67+ Years old  Completed   DEXA SCAN  Completed   HPV VACCINES  Aged Out   Meningococcal B Vaccine  Aged Out   DTaP/Tdap/Td  Discontinued   Colonoscopy  Discontinued   COVID-19 Vaccine  Discontinued   Zoster Vaccines- Shingrix  Discontinued    Health Maintenance  There are no preventive care reminders to display for this patient.  Health Maintenance Items Addressed: None needed at this time  Additional Screening:  Vision Screening: Recommended annual  ophthalmology exams for early detection of glaucoma and other disorders of the eye. Would you like a referral to an eye doctor? No    Dental Screening: Recommended annual dental exams for proper oral hygiene  Community Resource Referral / Chronic Care Management: CRR required this visit?  No   CCM required this visit?  No   Plan:    I have personally reviewed and noted the following in the patient's chart:   Medical and social history Use of alcohol , tobacco or illicit drugs  Current medications and supplements including opioid prescriptions. Patient is not currently taking opioid prescriptions. Functional ability and status Nutritional status Physical activity Advanced directives List of other physicians Hospitalizations, surgeries, and ER visits in previous 12 months Vitals Screenings to include cognitive, depression, and falls Referrals and appointments  In addition, I have reviewed and discussed with patient certain preventive protocols, quality metrics, and best practice recommendations. A written personalized care plan for preventive services as well as general preventive health recommendations were provided to patient.   Nerissa Bannister,  LPN   1/61/0960   After Visit Summary: (Declined) Due to this being a telephonic visit, with patients personalized plan was offered to patient but patient Declined AVS at this time   Notes: Pt says she has trouble hearing. She had trouble hearing on the call some. She says her hearing aids do not work. Pt talks of missing her husband and managing without him. She relays she has the support of her sisters. Not sure if patient would accept grief counseling. She does not drive much and just does not get out. Pt scheduled and reminder of her appt with you on tomorrow.

## 2023-12-04 ENCOUNTER — Encounter: Payer: Self-pay | Admitting: Family Medicine

## 2023-12-04 ENCOUNTER — Ambulatory Visit (INDEPENDENT_AMBULATORY_CARE_PROVIDER_SITE_OTHER): Admitting: Family Medicine

## 2023-12-04 VITALS — BP 100/68 | HR 69 | Temp 98.7°F | Ht 62.75 in | Wt 123.4 lb

## 2023-12-04 DIAGNOSIS — N3001 Acute cystitis with hematuria: Secondary | ICD-10-CM

## 2023-12-12 ENCOUNTER — Other Ambulatory Visit: Payer: Self-pay | Admitting: Family Medicine

## 2023-12-12 DIAGNOSIS — F410 Panic disorder [episodic paroxysmal anxiety] without agoraphobia: Secondary | ICD-10-CM

## 2023-12-16 DIAGNOSIS — L821 Other seborrheic keratosis: Secondary | ICD-10-CM | POA: Diagnosis not present

## 2023-12-16 DIAGNOSIS — L814 Other melanin hyperpigmentation: Secondary | ICD-10-CM | POA: Diagnosis not present

## 2023-12-16 DIAGNOSIS — L820 Inflamed seborrheic keratosis: Secondary | ICD-10-CM | POA: Diagnosis not present

## 2023-12-17 ENCOUNTER — Other Ambulatory Visit: Payer: Self-pay | Admitting: Family Medicine

## 2023-12-17 DIAGNOSIS — K589 Irritable bowel syndrome without diarrhea: Secondary | ICD-10-CM

## 2023-12-26 DIAGNOSIS — M170 Bilateral primary osteoarthritis of knee: Secondary | ICD-10-CM | POA: Diagnosis not present

## 2023-12-26 DIAGNOSIS — M25532 Pain in left wrist: Secondary | ICD-10-CM | POA: Diagnosis not present

## 2023-12-26 DIAGNOSIS — M1711 Unilateral primary osteoarthritis, right knee: Secondary | ICD-10-CM | POA: Diagnosis not present

## 2023-12-26 DIAGNOSIS — M1712 Unilateral primary osteoarthritis, left knee: Secondary | ICD-10-CM | POA: Diagnosis not present

## 2024-01-23 DIAGNOSIS — R102 Pelvic and perineal pain: Secondary | ICD-10-CM | POA: Diagnosis not present

## 2024-01-23 DIAGNOSIS — R300 Dysuria: Secondary | ICD-10-CM | POA: Diagnosis not present

## 2024-01-23 DIAGNOSIS — N952 Postmenopausal atrophic vaginitis: Secondary | ICD-10-CM | POA: Diagnosis not present

## 2024-01-30 ENCOUNTER — Ambulatory Visit: Payer: PPO | Admitting: Internal Medicine

## 2024-02-13 ENCOUNTER — Ambulatory Visit: Payer: PPO | Admitting: Internal Medicine

## 2024-02-19 ENCOUNTER — Other Ambulatory Visit: Payer: Self-pay | Admitting: Family Medicine

## 2024-02-19 DIAGNOSIS — F410 Panic disorder [episodic paroxysmal anxiety] without agoraphobia: Secondary | ICD-10-CM

## 2024-02-19 NOTE — Telephone Encounter (Signed)
 Last office visit 12/04/23 for acute cystitis.  Last refilled 11/22/23 for #30 with 2 refills.  Next appt: 03/02/24 for 6 month follow up.

## 2024-02-25 DIAGNOSIS — M1711 Unilateral primary osteoarthritis, right knee: Secondary | ICD-10-CM | POA: Diagnosis not present

## 2024-02-25 DIAGNOSIS — M170 Bilateral primary osteoarthritis of knee: Secondary | ICD-10-CM | POA: Diagnosis not present

## 2024-02-29 NOTE — Progress Notes (Unsigned)
 Debra Fennelly T. Debra Milhoan, MD, CAQ Sports Medicine [facility] at [facility] [address] [place], [address]  Phone: [phone]  FAX: [phone]  Debra White - 89 y.o. female  MRN [id number]  Date of Birth: [birth year]  Date: 03/02/2024  PCP: Watt Mirza, MD  Referral: Watt Mirza, MD  No chief complaint on file.  Subjective:   Debra White is a 89 y.o. very pleasant female patient with There is no height or weight on file to calculate BMI. who presents with the following:  Discussed the use of AI scribe software for clinical note transcription with the patient, who gave verbal consent to proceed.  This is a complicated former Dr. Marquis patient who is here for 83-month follow-up.  Multiple medical problems.  History of coronary disease, history of embolic stroke, diastolic heart failure, chronic A-fib. She is on Eliquis , lovastatin  20 mg, amlodipine  2.5 mg, atenolol  100 mg, Benicar  40 mg. She does see cardiology regularly  She also has COPD.  She does not take any scheduled inhalers.  History of anxiety and depression.  Lexapro  10 mg along with Klonopin  0.25 mg p.o. twice daily.  History of severe benzodiazepine withdrawal in the past.  She also takes gabapentin  300 mg p.o. 3 times daily after chronic pain from facial injury, fracture, surgery.  History of Present Illness     Review of Systems is noted in the HPI, as appropriate  Objective:   There were no vitals taken for this visit.  GEN: No acute distress; alert,appropriate. PULM: Breathing comfortably in no respiratory distress PSYCH: Normally interactive.   Physical Exam   Laboratory and Imaging Data:  Assessment and Plan:   No diagnosis found. Assessment & Plan   Medication Management during today's office visit: No orders of the defined types were placed in this encounter.  There are no discontinued medications.  Orders placed today for conditions managed  today: No orders of the defined types were placed in this encounter.   Disposition: No follow-ups on file.  Dragon [employer] One speech-to-text software was used for transcription in this dictation.  Possible transcriptional errors can occur using Animal nutritionist.   Signed,  Mirza DASEN. Debra Brunke, MD   Outpatient Encounter Medications as of 03/02/2024  Medication Sig   acetaminophen  (TYLENOL ) 500 MG tablet Take 1,000 mg by mouth every 8 (eight) hours as needed for headache.    amLODipine  (NORVASC ) 2.5 MG tablet Take 1 tablet (2.5 mg total) by mouth daily.   apixaban  (ELIQUIS ) 2.5 MG TABS tablet Take  1 tablet   2 x /day  for Afib  & to Prevent Blood Clots   atenolol  (TENORMIN ) 100 MG tablet TAKE 1 TABLET BY MOUTH DAILY   clonazePAM  (KLONOPIN ) 0.5 MG tablet TAKE 1/2 TABLET BY MOUTH ONCE TO TWICE DAILY AS NEEDED FOR CHRONIC ANXIETY AND PANIC ATTACKS   conjugated estrogens (PREMARIN) vaginal cream Place 1 Applicatorful vaginally See admin instructions. Apply one applicatorful vaginally once a week   Cyanocobalamin  (VITAMIN B 12 PO) Take 1 tablet by mouth daily.   dicyclomine  (BENTYL ) 20 MG tablet TAKE 1 TABLET BY MOUTH FOUR TIMES DAILY WITH MEALS AND AT BEDTIME FOR CRAMPING NAUSEA OR BLOATING   escitalopram  (LEXAPRO ) 10 MG tablet Take 1 tablet Daily for Mood & Chronic Anxiety   estradiol (ESTRACE) 0.1 MG/GM vaginal cream Place 1 Applicatorful vaginally 2 (two) times a week.   fluorometholone (FML) 0.1 % ophthalmic suspension Place 1 drop into both eyes 4 (four)  times daily.   gabapentin  (NEURONTIN ) 600 MG tablet TAKE 1/2 TO 1 TABLET BY MOUTH 3 TIMES DAILY FOR FACIAL PAIN   lovastatin  (MEVACOR ) 20 MG tablet TAKE 1 TABLET BY MOUTH EACH NIGHT AT BEDTIME FOR CHOLESTEROL   mirtazapine  (REMERON ) 15 MG tablet TAKE 1/2 TABLET BY MOUTH 1-2 HOURS BEFORE BEDTIME FOR APPETITE AND SLEEP   olmesartan  (BENICAR ) 40 MG tablet Take 1 tablet (40 mg total) by mouth daily.   omeprazole  (PRILOSEC ) 40 MG capsule Take                                                                                                                                                                                                              1 capsule   Daily  to Prevent  Heartburn & Indigestion                                                         /                                                                   TAKE                                         BY                                                 MOUTH   polyvinyl alcohol  (LIQUIFILM TEARS) 1.4 % ophthalmic solution Place 1 drop into both eyes 2 (two) times daily as needed for dry eyes.   RESTASIS 0.05 % ophthalmic emulsion Place 1 drop into both eyes 2 (two) times daily.   Trolamine Salicylate (BLUE-EMU MAXIMUM PAIN RELIEF EX) Apply topically.   Vitamin D -Vitamin K (VITAMIN K2-VITAMIN D3 PO) Take 1 tablet by mouth daily. 90 mcg of K2, 5,000 units of D3   Zinc 50 MG TABS Take by mouth.  No facility-administered encounter medications on file as of 03/02/2024.

## 2024-03-02 ENCOUNTER — Ambulatory Visit (INDEPENDENT_AMBULATORY_CARE_PROVIDER_SITE_OTHER): Admitting: Family Medicine

## 2024-03-02 ENCOUNTER — Encounter: Payer: Self-pay | Admitting: Family Medicine

## 2024-03-02 VITALS — BP 112/65 | HR 62 | Temp 97.6°F | Ht 62.75 in | Wt 126.0 lb

## 2024-03-02 DIAGNOSIS — F411 Generalized anxiety disorder: Secondary | ICD-10-CM

## 2024-03-02 DIAGNOSIS — Z8673 Personal history of transient ischemic attack (TIA), and cerebral infarction without residual deficits: Secondary | ICD-10-CM | POA: Diagnosis not present

## 2024-03-02 DIAGNOSIS — I110 Hypertensive heart disease with heart failure: Secondary | ICD-10-CM | POA: Diagnosis not present

## 2024-03-02 DIAGNOSIS — I10 Essential (primary) hypertension: Secondary | ICD-10-CM

## 2024-03-02 DIAGNOSIS — I5032 Chronic diastolic (congestive) heart failure: Secondary | ICD-10-CM

## 2024-03-02 DIAGNOSIS — K5792 Diverticulitis of intestine, part unspecified, without perforation or abscess without bleeding: Secondary | ICD-10-CM

## 2024-03-02 DIAGNOSIS — I2510 Atherosclerotic heart disease of native coronary artery without angina pectoris: Secondary | ICD-10-CM

## 2024-03-02 DIAGNOSIS — J431 Panlobular emphysema: Secondary | ICD-10-CM

## 2024-03-02 DIAGNOSIS — E782 Mixed hyperlipidemia: Secondary | ICD-10-CM

## 2024-03-02 DIAGNOSIS — I482 Chronic atrial fibrillation, unspecified: Secondary | ICD-10-CM | POA: Diagnosis not present

## 2024-03-02 MED ORDER — METRONIDAZOLE 500 MG PO TABS: 500.0000 mg | ORAL_TABLET | Freq: Three times a day (TID) | ORAL | 0 refills | Status: AC

## 2024-03-02 MED ORDER — CIPROFLOXACIN HCL 500 MG PO TABS: 500.0000 mg | ORAL_TABLET | Freq: Two times a day (BID) | ORAL | 0 refills | Status: AC

## 2024-03-24 DIAGNOSIS — M1712 Unilateral primary osteoarthritis, left knee: Secondary | ICD-10-CM | POA: Diagnosis not present

## 2024-04-14 DIAGNOSIS — H903 Sensorineural hearing loss, bilateral: Secondary | ICD-10-CM | POA: Diagnosis not present

## 2024-04-30 DIAGNOSIS — H903 Sensorineural hearing loss, bilateral: Secondary | ICD-10-CM | POA: Diagnosis not present

## 2024-05-11 ENCOUNTER — Telehealth: Payer: Self-pay | Admitting: Family Medicine

## 2024-05-11 DIAGNOSIS — G500 Trigeminal neuralgia: Secondary | ICD-10-CM

## 2024-05-11 NOTE — Telephone Encounter (Unsigned)
 Copied from CRM #[id number]. Topic: Clinical - Medication Refill >> May 11, 2024 12:40 PM Mercedes MATSU wrote: Medication:  gabapentin  (NEURONTIN ) 600 MG tablet   Has the patient contacted their pharmacy? Yes (Agent: If no, request that the patient contact the pharmacy for the refill. If patient does not wish to contact the pharmacy document the reason why and proceed with request.) (Agent: If yes, when and what did the pharmacy advise?)  This is the patient's preferred pharmacy:  [facility] - [place], [place] - [address] [place] [address] Phone: [phone] Fax: [phone]  Is this the correct pharmacy for this prescription? Yes If no, delete pharmacy and type the correct one.   Has the prescription been filled recently? Yes  Is the patient out of the medication? No  Has the patient been seen for an appointment in the last year OR does the patient have an upcoming appointment? Yes  Can we respond through MyChart? Yes  Agent: Please be advised that Rx refills may take up to 3 business days. We ask that you follow-up with your pharmacy.

## 2024-05-12 MED ORDER — GABAPENTIN 600 MG PO TABS: ORAL_TABLET | ORAL | 1 refills | Status: AC

## 2024-05-12 NOTE — Telephone Encounter (Signed)
 Last office visit 03/02/24 for Medical Management of Chronic Issues.  Last refilled 07/29/23 for #270 with 1 refill by Bascom Necessary, NP.  Next Appt: CPE 03.09.2026

## 2024-05-14 DIAGNOSIS — M1711 Unilateral primary osteoarthritis, right knee: Secondary | ICD-10-CM | POA: Diagnosis not present

## 2024-06-08 ENCOUNTER — Other Ambulatory Visit: Payer: Self-pay | Admitting: Family

## 2024-06-19 ENCOUNTER — Other Ambulatory Visit: Payer: Self-pay | Admitting: Family Medicine

## 2024-06-19 DIAGNOSIS — K589 Irritable bowel syndrome without diarrhea: Secondary | ICD-10-CM

## 2024-07-01 ENCOUNTER — Other Ambulatory Visit: Payer: Self-pay | Admitting: Family Medicine

## 2024-07-01 DIAGNOSIS — F410 Panic disorder [episodic paroxysmal anxiety] without agoraphobia: Secondary | ICD-10-CM

## 2024-07-01 MED ORDER — CLONAZEPAM 0.5 MG PO TABS: ORAL_TABLET | ORAL | 3 refills | Status: AC

## 2024-07-01 NOTE — Telephone Encounter (Signed)
 Last office visit 03/02/2024 for medical management of chronic issues.   Last refilled 02/19/2024 for #30 with 3 refills. Next appt: CPE 08/31/2024.

## 2024-07-06 ENCOUNTER — Other Ambulatory Visit: Payer: Self-pay | Admitting: Nurse Practitioner

## 2024-07-06 DIAGNOSIS — N280 Ischemia and infarction of kidney: Secondary | ICD-10-CM

## 2024-07-06 DIAGNOSIS — E782 Mixed hyperlipidemia: Secondary | ICD-10-CM

## 2024-07-06 DIAGNOSIS — I700 Atherosclerosis of aorta: Secondary | ICD-10-CM

## 2024-07-08 ENCOUNTER — Other Ambulatory Visit: Payer: Self-pay | Admitting: Family Medicine

## 2024-07-08 DIAGNOSIS — N280 Ischemia and infarction of kidney: Secondary | ICD-10-CM

## 2024-07-08 DIAGNOSIS — E782 Mixed hyperlipidemia: Secondary | ICD-10-CM

## 2024-07-08 DIAGNOSIS — I700 Atherosclerosis of aorta: Secondary | ICD-10-CM

## 2024-08-24 ENCOUNTER — Other Ambulatory Visit

## 2024-08-31 ENCOUNTER — Encounter: Admitting: Family Medicine

## 2024-09-03 ENCOUNTER — Other Ambulatory Visit

## 2024-09-10 ENCOUNTER — Encounter: Admitting: Family Medicine

## 2024-12-03 ENCOUNTER — Ambulatory Visit

## 2024-12-04 ENCOUNTER — Ambulatory Visit
